# Patient Record
Sex: Female | Born: 1940
Health system: Southern US, Community
[De-identification: ages and names within clinical notes are randomized; demographics above are authoritative.]

## PROBLEM LIST (undated history)

## (undated) ENCOUNTER — Emergency Department (HOSPITAL_COMMUNITY): Admission: EM | Payer: Medicare Other | Source: Home / Self Care

## (undated) DIAGNOSIS — J40 Bronchitis, not specified as acute or chronic: Secondary | ICD-10-CM

## (undated) DIAGNOSIS — G4700 Insomnia, unspecified: Secondary | ICD-10-CM

## (undated) DIAGNOSIS — G629 Polyneuropathy, unspecified: Secondary | ICD-10-CM

## (undated) DIAGNOSIS — Z9889 Other specified postprocedural states: Secondary | ICD-10-CM

## (undated) DIAGNOSIS — H269 Unspecified cataract: Secondary | ICD-10-CM

## (undated) DIAGNOSIS — I2510 Atherosclerotic heart disease of native coronary artery without angina pectoris: Secondary | ICD-10-CM

## (undated) DIAGNOSIS — R1032 Left lower quadrant pain: Secondary | ICD-10-CM

## (undated) DIAGNOSIS — K219 Gastro-esophageal reflux disease without esophagitis: Secondary | ICD-10-CM

## (undated) DIAGNOSIS — T7840XA Allergy, unspecified, initial encounter: Secondary | ICD-10-CM

## (undated) DIAGNOSIS — J45909 Unspecified asthma, uncomplicated: Secondary | ICD-10-CM

## (undated) DIAGNOSIS — Z87898 Personal history of other specified conditions: Secondary | ICD-10-CM

## (undated) DIAGNOSIS — E559 Vitamin D deficiency, unspecified: Secondary | ICD-10-CM

## (undated) DIAGNOSIS — Z9221 Personal history of antineoplastic chemotherapy: Secondary | ICD-10-CM

## (undated) DIAGNOSIS — R635 Abnormal weight gain: Secondary | ICD-10-CM

## (undated) DIAGNOSIS — Z8669 Personal history of other diseases of the nervous system and sense organs: Secondary | ICD-10-CM

## (undated) DIAGNOSIS — R7303 Prediabetes: Secondary | ICD-10-CM

## (undated) DIAGNOSIS — I6523 Occlusion and stenosis of bilateral carotid arteries: Secondary | ICD-10-CM

## (undated) DIAGNOSIS — R519 Headache, unspecified: Secondary | ICD-10-CM

## (undated) DIAGNOSIS — K5792 Diverticulitis of intestine, part unspecified, without perforation or abscess without bleeding: Secondary | ICD-10-CM

## (undated) DIAGNOSIS — M1990 Unspecified osteoarthritis, unspecified site: Secondary | ICD-10-CM

## (undated) DIAGNOSIS — F5104 Psychophysiologic insomnia: Secondary | ICD-10-CM

## (undated) DIAGNOSIS — B3781 Candidal esophagitis: Secondary | ICD-10-CM

## (undated) DIAGNOSIS — R51 Headache: Secondary | ICD-10-CM

## (undated) DIAGNOSIS — E079 Disorder of thyroid, unspecified: Secondary | ICD-10-CM

## (undated) DIAGNOSIS — D649 Anemia, unspecified: Secondary | ICD-10-CM

## (undated) DIAGNOSIS — J9801 Acute bronchospasm: Secondary | ICD-10-CM

## (undated) DIAGNOSIS — J189 Pneumonia, unspecified organism: Secondary | ICD-10-CM

## (undated) DIAGNOSIS — T4145XA Adverse effect of unspecified anesthetic, initial encounter: Secondary | ICD-10-CM

## (undated) DIAGNOSIS — K5790 Diverticulosis of intestine, part unspecified, without perforation or abscess without bleeding: Secondary | ICD-10-CM

## (undated) DIAGNOSIS — H359 Unspecified retinal disorder: Secondary | ICD-10-CM

## (undated) DIAGNOSIS — M4802 Spinal stenosis, cervical region: Secondary | ICD-10-CM

## (undated) DIAGNOSIS — D229 Melanocytic nevi, unspecified: Secondary | ICD-10-CM

## (undated) DIAGNOSIS — R748 Abnormal levels of other serum enzymes: Secondary | ICD-10-CM

## (undated) DIAGNOSIS — R112 Nausea with vomiting, unspecified: Secondary | ICD-10-CM

## (undated) DIAGNOSIS — R32 Unspecified urinary incontinence: Secondary | ICD-10-CM

## (undated) DIAGNOSIS — J449 Chronic obstructive pulmonary disease, unspecified: Secondary | ICD-10-CM

## (undated) DIAGNOSIS — T8859XA Other complications of anesthesia, initial encounter: Secondary | ICD-10-CM

## (undated) DIAGNOSIS — F32A Depression, unspecified: Secondary | ICD-10-CM

## (undated) DIAGNOSIS — R42 Dizziness and giddiness: Secondary | ICD-10-CM

## (undated) DIAGNOSIS — E785 Hyperlipidemia, unspecified: Secondary | ICD-10-CM

## (undated) DIAGNOSIS — Z8489 Family history of other specified conditions: Secondary | ICD-10-CM

## (undated) DIAGNOSIS — C4492 Squamous cell carcinoma of skin, unspecified: Secondary | ICD-10-CM

## (undated) DIAGNOSIS — F329 Major depressive disorder, single episode, unspecified: Secondary | ICD-10-CM

## (undated) DIAGNOSIS — K589 Irritable bowel syndrome without diarrhea: Secondary | ICD-10-CM

## (undated) DIAGNOSIS — R5383 Other fatigue: Secondary | ICD-10-CM

## (undated) DIAGNOSIS — Z87442 Personal history of urinary calculi: Secondary | ICD-10-CM

## (undated) DIAGNOSIS — E039 Hypothyroidism, unspecified: Secondary | ICD-10-CM

## (undated) DIAGNOSIS — F419 Anxiety disorder, unspecified: Secondary | ICD-10-CM

## (undated) DIAGNOSIS — K760 Fatty (change of) liver, not elsewhere classified: Secondary | ICD-10-CM

## (undated) DIAGNOSIS — C439 Malignant melanoma of skin, unspecified: Secondary | ICD-10-CM

## (undated) DIAGNOSIS — I639 Cerebral infarction, unspecified: Secondary | ICD-10-CM

## (undated) HISTORY — DX: Occlusion and stenosis of bilateral carotid arteries: I65.23

## (undated) HISTORY — DX: Headache: R51

## (undated) HISTORY — DX: Other fatigue: R53.83

## (undated) HISTORY — PX: BREAST SURGERY: SHX581

## (undated) HISTORY — PX: JOINT REPLACEMENT: SHX530

## (undated) HISTORY — PX: SKIN CANCER EXCISION: SHX779

## (undated) HISTORY — DX: Unspecified retinal disorder: H35.9

## (undated) HISTORY — DX: Personal history of other diseases of the nervous system and sense organs: Z86.69

## (undated) HISTORY — DX: Headache, unspecified: R51.9

## (undated) HISTORY — DX: Vitamin D deficiency, unspecified: E55.9

## (undated) HISTORY — DX: Diverticulosis of intestine, part unspecified, without perforation or abscess without bleeding: K57.90

## (undated) HISTORY — DX: Polyneuropathy, unspecified: G62.9

## (undated) HISTORY — PX: TOTAL KNEE ARTHROPLASTY: SHX125

## (undated) HISTORY — DX: Irritable bowel syndrome, unspecified: K58.9

## (undated) HISTORY — DX: Hyperlipidemia, unspecified: E78.5

## (undated) HISTORY — DX: Squamous cell carcinoma of skin, unspecified: C44.92

## (undated) HISTORY — DX: Acute bronchospasm: J98.01

## (undated) HISTORY — DX: Abnormal levels of other serum enzymes: R74.8

## (undated) HISTORY — PX: ROTATOR CUFF REPAIR: SHX139

## (undated) HISTORY — DX: Allergy, unspecified, initial encounter: T78.40XA

## (undated) HISTORY — DX: Personal history of other specified conditions: Z87.898

## (undated) HISTORY — DX: Psychophysiologic insomnia: F51.04

## (undated) HISTORY — PX: SHOULDER SURGERY: SHX246

## (undated) HISTORY — DX: Diverticulitis of intestine, part unspecified, without perforation or abscess without bleeding: K57.92

## (undated) HISTORY — PX: KNEE ARTHROSCOPY: SUR90

## (undated) HISTORY — DX: Unspecified cataract: H26.9

## (undated) HISTORY — PX: CLOSED MANIPULATION SHOULDER: SUR205

## (undated) HISTORY — DX: Dizziness and giddiness: R42

## (undated) HISTORY — DX: Atherosclerotic heart disease of native coronary artery without angina pectoris: I25.10

## (undated) HISTORY — DX: Unspecified asthma, uncomplicated: J45.909

## (undated) HISTORY — PX: OOPHORECTOMY: SHX86

## (undated) HISTORY — DX: Left lower quadrant pain: R10.32

## (undated) HISTORY — PX: TOTAL HIP ARTHROPLASTY: SHX124

## (undated) HISTORY — PX: OTHER SURGICAL HISTORY: SHX169

## (undated) HISTORY — DX: Unspecified osteoarthritis, unspecified site: M19.90

## (undated) HISTORY — DX: Fatty (change of) liver, not elsewhere classified: K76.0

## (undated) HISTORY — DX: Abnormal weight gain: R63.5

## (undated) HISTORY — DX: Spinal stenosis, cervical region: M48.02

## (undated) HISTORY — DX: Melanocytic nevi, unspecified: D22.9

## (undated) HISTORY — PX: TUBAL LIGATION: SHX77

## (undated) HISTORY — DX: Bronchitis, not specified as acute or chronic: J40

## (undated) HISTORY — DX: Unspecified urinary incontinence: R32

## (undated) HISTORY — PX: TUMOR REMOVAL: SHX12

## (undated) HISTORY — PX: ABLATION: SHX5711

## (undated) HISTORY — PX: FOOT BONE EXCISION: SUR493

## (undated) HISTORY — PX: BILATERAL SALPINGOOPHORECTOMY: SHX1223

## (undated) HISTORY — DX: Chronic obstructive pulmonary disease, unspecified: J44.9

## (undated) HISTORY — DX: Anemia, unspecified: D64.9

## (undated) HISTORY — DX: Candidal esophagitis: B37.81

---

## 1998-11-10 ENCOUNTER — Other Ambulatory Visit: Admission: RE | Admit: 1998-11-10 | Discharge: 1998-11-10 | Payer: Self-pay | Admitting: Gynecology

## 1998-12-29 ENCOUNTER — Other Ambulatory Visit: Admission: RE | Admit: 1998-12-29 | Discharge: 1998-12-29 | Payer: Self-pay | Admitting: Gynecology

## 1999-11-15 ENCOUNTER — Other Ambulatory Visit: Admission: RE | Admit: 1999-11-15 | Discharge: 1999-11-15 | Payer: Self-pay | Admitting: Gynecology

## 2000-04-25 ENCOUNTER — Ambulatory Visit (HOSPITAL_COMMUNITY): Admission: RE | Admit: 2000-04-25 | Discharge: 2000-04-25 | Payer: Self-pay | Admitting: Gastroenterology

## 2002-03-12 ENCOUNTER — Other Ambulatory Visit: Admission: RE | Admit: 2002-03-12 | Discharge: 2002-03-12 | Payer: Self-pay | Admitting: Obstetrics and Gynecology

## 2002-05-08 ENCOUNTER — Encounter: Payer: Self-pay | Admitting: Emergency Medicine

## 2002-05-08 ENCOUNTER — Observation Stay (HOSPITAL_COMMUNITY): Admission: EM | Admit: 2002-05-08 | Discharge: 2002-05-09 | Payer: Self-pay | Admitting: Emergency Medicine

## 2002-05-09 ENCOUNTER — Encounter: Payer: Self-pay | Admitting: Internal Medicine

## 2003-03-18 ENCOUNTER — Encounter: Payer: Self-pay | Admitting: Internal Medicine

## 2003-03-18 ENCOUNTER — Encounter: Admission: RE | Admit: 2003-03-18 | Discharge: 2003-03-18 | Payer: Self-pay | Admitting: Internal Medicine

## 2003-06-17 ENCOUNTER — Other Ambulatory Visit: Admission: RE | Admit: 2003-06-17 | Discharge: 2003-06-17 | Payer: Self-pay | Admitting: Gynecology

## 2003-08-30 ENCOUNTER — Inpatient Hospital Stay (HOSPITAL_COMMUNITY): Admission: RE | Admit: 2003-08-30 | Discharge: 2003-08-31 | Payer: Self-pay | Admitting: Orthopedic Surgery

## 2004-08-25 ENCOUNTER — Encounter: Admission: RE | Admit: 2004-08-25 | Discharge: 2004-08-25 | Payer: Self-pay | Admitting: Orthopedic Surgery

## 2004-08-31 ENCOUNTER — Ambulatory Visit (HOSPITAL_BASED_OUTPATIENT_CLINIC_OR_DEPARTMENT_OTHER): Admission: RE | Admit: 2004-08-31 | Discharge: 2004-08-31 | Payer: Self-pay | Admitting: Orthopedic Surgery

## 2004-08-31 ENCOUNTER — Ambulatory Visit (HOSPITAL_COMMUNITY): Admission: RE | Admit: 2004-08-31 | Discharge: 2004-08-31 | Payer: Self-pay | Admitting: Orthopedic Surgery

## 2004-09-30 ENCOUNTER — Other Ambulatory Visit: Admission: RE | Admit: 2004-09-30 | Discharge: 2004-09-30 | Payer: Self-pay | Admitting: Gynecology

## 2005-07-01 ENCOUNTER — Encounter: Admission: RE | Admit: 2005-07-01 | Discharge: 2005-07-01 | Payer: Self-pay | Admitting: Internal Medicine

## 2005-09-05 ENCOUNTER — Encounter: Admission: RE | Admit: 2005-09-05 | Discharge: 2005-09-05 | Payer: Self-pay | Admitting: Internal Medicine

## 2005-10-13 ENCOUNTER — Other Ambulatory Visit: Admission: RE | Admit: 2005-10-13 | Discharge: 2005-10-13 | Payer: Self-pay | Admitting: Gynecology

## 2006-09-12 ENCOUNTER — Encounter: Admission: RE | Admit: 2006-09-12 | Discharge: 2006-09-12 | Payer: Self-pay | Admitting: *Deleted

## 2006-12-19 ENCOUNTER — Encounter (INDEPENDENT_AMBULATORY_CARE_PROVIDER_SITE_OTHER): Payer: Self-pay | Admitting: Obstetrics and Gynecology

## 2006-12-19 ENCOUNTER — Ambulatory Visit (HOSPITAL_COMMUNITY): Admission: RE | Admit: 2006-12-19 | Discharge: 2006-12-19 | Payer: Self-pay | Admitting: Obstetrics and Gynecology

## 2007-07-26 ENCOUNTER — Encounter: Admission: RE | Admit: 2007-07-26 | Discharge: 2007-10-09 | Payer: Self-pay | Admitting: Neurology

## 2007-07-26 ENCOUNTER — Ambulatory Visit: Payer: Self-pay | Admitting: Psychology

## 2008-01-14 ENCOUNTER — Emergency Department (HOSPITAL_BASED_OUTPATIENT_CLINIC_OR_DEPARTMENT_OTHER): Admission: EM | Admit: 2008-01-14 | Discharge: 2008-01-15 | Payer: Self-pay | Admitting: Emergency Medicine

## 2008-04-02 ENCOUNTER — Ambulatory Visit: Payer: Self-pay | Admitting: Psychology

## 2008-04-02 ENCOUNTER — Encounter: Admission: RE | Admit: 2008-04-02 | Discharge: 2008-06-20 | Payer: Self-pay | Admitting: Psychology

## 2008-05-22 ENCOUNTER — Encounter: Admission: RE | Admit: 2008-05-22 | Discharge: 2008-07-08 | Payer: Self-pay | Admitting: Orthopedic Surgery

## 2008-05-26 ENCOUNTER — Ambulatory Visit: Payer: Self-pay | Admitting: Psychology

## 2008-08-05 ENCOUNTER — Ambulatory Visit: Payer: Self-pay | Admitting: Vascular Surgery

## 2008-08-05 ENCOUNTER — Ambulatory Visit: Admission: RE | Admit: 2008-08-05 | Discharge: 2008-08-05 | Payer: Self-pay | Admitting: Orthopedic Surgery

## 2008-08-05 ENCOUNTER — Encounter (INDEPENDENT_AMBULATORY_CARE_PROVIDER_SITE_OTHER): Payer: Self-pay | Admitting: Orthopedic Surgery

## 2008-08-25 ENCOUNTER — Inpatient Hospital Stay (HOSPITAL_COMMUNITY): Admission: RE | Admit: 2008-08-25 | Discharge: 2008-08-28 | Payer: Self-pay | Admitting: Orthopedic Surgery

## 2008-09-29 ENCOUNTER — Encounter: Admission: RE | Admit: 2008-09-29 | Discharge: 2008-12-23 | Payer: Self-pay | Admitting: Orthopedic Surgery

## 2008-10-23 ENCOUNTER — Encounter: Admission: RE | Admit: 2008-10-23 | Discharge: 2008-10-23 | Payer: Self-pay | Admitting: Family Medicine

## 2009-04-08 ENCOUNTER — Ambulatory Visit (HOSPITAL_COMMUNITY): Admission: RE | Admit: 2009-04-08 | Discharge: 2009-04-08 | Payer: Self-pay | Admitting: Orthopedic Surgery

## 2010-01-13 ENCOUNTER — Emergency Department (HOSPITAL_COMMUNITY): Admission: EM | Admit: 2010-01-13 | Discharge: 2010-01-14 | Payer: Self-pay | Admitting: Emergency Medicine

## 2010-05-09 ENCOUNTER — Ambulatory Visit: Payer: Self-pay | Admitting: Diagnostic Radiology

## 2010-05-09 ENCOUNTER — Emergency Department (HOSPITAL_BASED_OUTPATIENT_CLINIC_OR_DEPARTMENT_OTHER): Admission: EM | Admit: 2010-05-09 | Discharge: 2010-05-09 | Payer: Self-pay | Admitting: Emergency Medicine

## 2010-08-08 ENCOUNTER — Encounter: Payer: Self-pay | Admitting: Internal Medicine

## 2010-08-12 ENCOUNTER — Other Ambulatory Visit: Payer: Self-pay | Admitting: Dermatology

## 2010-09-29 LAB — URINALYSIS, ROUTINE W REFLEX MICROSCOPIC
Bilirubin Urine: NEGATIVE
Glucose, UA: NEGATIVE mg/dL
Hgb urine dipstick: NEGATIVE
Ketones, ur: 40 mg/dL — AB
Nitrite: NEGATIVE
Protein, ur: NEGATIVE mg/dL
Specific Gravity, Urine: 1.026 (ref 1.005–1.030)
Urobilinogen, UA: 0.2 mg/dL (ref 0.0–1.0)
pH: 6.5 (ref 5.0–8.0)

## 2010-10-03 LAB — CBC
HCT: 41.2 % (ref 36.0–46.0)
Hemoglobin: 14.1 g/dL (ref 12.0–15.0)
MCH: 31 pg (ref 26.0–34.0)
MCHC: 34.2 g/dL (ref 30.0–36.0)
MCV: 90.6 fL (ref 78.0–100.0)
Platelets: 204 10*3/uL (ref 150–400)
RBC: 4.55 MIL/uL (ref 3.87–5.11)
RDW: 13.1 % (ref 11.5–15.5)
WBC: 5.6 10*3/uL (ref 4.0–10.5)

## 2010-10-03 LAB — COMPREHENSIVE METABOLIC PANEL
ALT: 15 U/L (ref 0–35)
AST: 17 U/L (ref 0–37)
Albumin: 3.9 g/dL (ref 3.5–5.2)
Alkaline Phosphatase: 73 U/L (ref 39–117)
BUN: 11 mg/dL (ref 6–23)
CO2: 24 mEq/L (ref 19–32)
Calcium: 8.7 mg/dL (ref 8.4–10.5)
Chloride: 107 mEq/L (ref 96–112)
Creatinine, Ser: 0.77 mg/dL (ref 0.4–1.2)
GFR calc Af Amer: >60 mL/min (ref >60)
GFR calc non Af Amer: >60 mL/min (ref >60)
Glucose, Bld: 106 mg/dL — ABNORMAL HIGH (ref 70–99)
Potassium: 3.7 mEq/L (ref 3.5–5.1)
Sodium: 141 mEq/L (ref 135–145)
Total Bilirubin: 1 mg/dL (ref 0.3–1.2)
Total Protein: 6.4 g/dL (ref 6.0–8.3)

## 2010-10-03 LAB — URINALYSIS, ROUTINE W REFLEX MICROSCOPIC
Bilirubin Urine: NEGATIVE
Glucose, UA: NEGATIVE mg/dL
Hgb urine dipstick: NEGATIVE
Ketones, ur: NEGATIVE mg/dL
Nitrite: NEGATIVE
Protein, ur: NEGATIVE mg/dL
Specific Gravity, Urine: 1.007 (ref 1.005–1.030)
Urobilinogen, UA: 0.2 mg/dL (ref 0.0–1.0)
pH: 6.5 (ref 5.0–8.0)

## 2010-10-03 LAB — URINE MICROSCOPIC-ADD ON

## 2010-10-03 LAB — D-DIMER, QUANTITATIVE: D-Dimer, Quant: 0.75 ug/mL-FEU — ABNORMAL HIGH (ref 0.00–0.48)

## 2010-10-03 LAB — POCT CARDIAC MARKERS
CKMB, poc: 1.5 ng/mL (ref 1.0–8.0)
Myoglobin, poc: 115 ng/mL (ref 12–200)
Troponin i, poc: <0.05 ng/mL (ref 0.00–0.09)

## 2010-10-03 LAB — DIFFERENTIAL
Basophils Absolute: 0 10*3/uL (ref 0.0–0.1)
Basophils Relative: 0 % (ref 0–1)
Eosinophils Absolute: 0 10*3/uL (ref 0.0–0.7)
Eosinophils Relative: 0 % (ref 0–5)
Lymphocytes Relative: 14 % (ref 12–46)
Lymphs Abs: 0.8 10*3/uL (ref 0.7–4.0)
Monocytes Absolute: 0.2 10*3/uL (ref 0.1–1.0)
Monocytes Relative: 4 % (ref 3–12)
Neutro Abs: 4.5 10*3/uL (ref 1.7–7.7)
Neutrophils Relative %: 82 % — ABNORMAL HIGH (ref 43–77)

## 2010-10-03 LAB — LIPASE, BLOOD: Lipase: 29 U/L (ref 11–59)

## 2010-11-02 LAB — BASIC METABOLIC PANEL
BUN: 16 mg/dL (ref 6–23)
BUN: 18 mg/dL (ref 6–23)
BUN: 19 mg/dL (ref 6–23)
CO2: 27 mEq/L (ref 19–32)
CO2: 28 mEq/L (ref 19–32)
CO2: 28 mEq/L (ref 19–32)
Calcium: 8 mg/dL — ABNORMAL LOW (ref 8.4–10.5)
Calcium: 8.4 mg/dL (ref 8.4–10.5)
Calcium: 9.1 mg/dL (ref 8.4–10.5)
Chloride: 106 mEq/L (ref 96–112)
Chloride: 107 mEq/L (ref 96–112)
Chloride: 98 mEq/L (ref 96–112)
Creatinine, Ser: 0.82 mg/dL (ref 0.4–1.2)
Creatinine, Ser: 0.85 mg/dL (ref 0.4–1.2)
Creatinine, Ser: 0.88 mg/dL (ref 0.4–1.2)
GFR calc Af Amer: >60 mL/min (ref >60)
GFR calc Af Amer: >60 mL/min (ref >60)
GFR calc Af Amer: >60 mL/min (ref >60)
GFR calc non Af Amer: >60 mL/min (ref >60)
GFR calc non Af Amer: >60 mL/min (ref >60)
GFR calc non Af Amer: >60 mL/min (ref >60)
Glucose, Bld: 128 mg/dL — ABNORMAL HIGH (ref 70–99)
Glucose, Bld: 134 mg/dL — ABNORMAL HIGH (ref 70–99)
Glucose, Bld: 148 mg/dL — ABNORMAL HIGH (ref 70–99)
Potassium: 3.6 mEq/L (ref 3.5–5.1)
Potassium: 3.9 mEq/L (ref 3.5–5.1)
Potassium: 4.2 mEq/L (ref 3.5–5.1)
Sodium: 135 mEq/L (ref 135–145)
Sodium: 136 mEq/L (ref 135–145)
Sodium: 138 mEq/L (ref 135–145)

## 2010-11-02 LAB — CBC
HCT: 29.4 % — ABNORMAL LOW (ref 36.0–46.0)
HCT: 30.6 % — ABNORMAL LOW (ref 36.0–46.0)
HCT: 38.6 % (ref 36.0–46.0)
Hemoglobin: 10.1 g/dL — ABNORMAL LOW (ref 12.0–15.0)
Hemoglobin: 10.3 g/dL — ABNORMAL LOW (ref 12.0–15.0)
Hemoglobin: 13 g/dL (ref 12.0–15.0)
MCHC: 33.6 g/dL (ref 30.0–36.0)
MCHC: 33.8 g/dL (ref 30.0–36.0)
MCHC: 34.3 g/dL (ref 30.0–36.0)
MCV: 91.6 fL (ref 78.0–100.0)
MCV: 92.5 fL (ref 78.0–100.0)
MCV: 93.1 fL (ref 78.0–100.0)
Platelets: 166 10*3/uL (ref 150–400)
Platelets: 168 10*3/uL (ref 150–400)
Platelets: 201 10*3/uL (ref 150–400)
RBC: 3.18 MIL/uL — ABNORMAL LOW (ref 3.87–5.11)
RBC: 3.29 MIL/uL — ABNORMAL LOW (ref 3.87–5.11)
RBC: 4.21 MIL/uL (ref 3.87–5.11)
RDW: 12.8 % (ref 11.5–15.5)
RDW: 12.8 % (ref 11.5–15.5)
RDW: 12.9 % (ref 11.5–15.5)
WBC: 5 10*3/uL (ref 4.0–10.5)
WBC: 6.1 10*3/uL (ref 4.0–10.5)
WBC: 6.6 10*3/uL (ref 4.0–10.5)

## 2010-11-02 LAB — URINALYSIS, ROUTINE W REFLEX MICROSCOPIC
Bilirubin Urine: NEGATIVE
Glucose, UA: NEGATIVE mg/dL
Hgb urine dipstick: NEGATIVE
Ketones, ur: NEGATIVE mg/dL
Nitrite: NEGATIVE
Protein, ur: NEGATIVE mg/dL
Specific Gravity, Urine: 1.01 (ref 1.005–1.030)
Urobilinogen, UA: 0.2 mg/dL (ref 0.0–1.0)
pH: 6.5 (ref 5.0–8.0)

## 2010-11-02 LAB — DIFFERENTIAL
Basophils Absolute: 0 10*3/uL (ref 0.0–0.1)
Basophils Relative: 1 % (ref 0–1)
Eosinophils Absolute: 0.1 10*3/uL (ref 0.0–0.7)
Eosinophils Relative: 2 % (ref 0–5)
Lymphocytes Relative: 28 % (ref 12–46)
Lymphs Abs: 1.4 10*3/uL (ref 0.7–4.0)
Monocytes Absolute: 0.4 10*3/uL (ref 0.1–1.0)
Monocytes Relative: 9 % (ref 3–12)
Neutro Abs: 3 10*3/uL (ref 1.7–7.7)
Neutrophils Relative %: 60 % (ref 43–77)

## 2010-11-02 LAB — PROTIME-INR
INR: 1.1 (ref 0.00–1.49)
Prothrombin Time: 14 seconds (ref 11.6–15.2)

## 2010-11-02 LAB — TYPE AND SCREEN
ABO/RH(D): B POS
Antibody Screen: NEGATIVE

## 2010-11-02 LAB — APTT: aPTT: 33 seconds (ref 24–37)

## 2010-11-02 LAB — ABO/RH: ABO/RH(D): B POS

## 2010-11-30 NOTE — Discharge Summary (Signed)
[name], [name]                ACCOUNT NO.:  [id number]   MEDICAL RECORD NO.:  [id number]          PATIENT TYPE:  INP   LOCATION:  1607                         FACILITY:  [facility]   PHYSICIAN:  Madlyn Frankel. Charlann Boxer, M.D.  DATE OF BIRTH:  1940/09/28   DATE OF ADMISSION:  08/25/2008  DATE OF DISCHARGE:  08/28/2008                               DISCHARGE SUMMARY   ADMITTING DIAGNOSES:  1. Osteoarthritis.  2. Anxiety and depression.  3. Irritable bowel syndrome.  4. Diverticulosis.  5. Degenerative disk disease.  6. Menopause.   DISCHARGE DIAGNOSES:  1. Osteoarthritis.  2. Anxiety and depression.  3. Irritable bowel syndrome.  4. Diverticulosis.  5. Degenerative disk disease.  6. Menopause.   HISTORY OF PRESENT ILLNESS:  A 70 year old female with a history of  right knee pain with multiple effusions secondary to osteoarthritis.  It  was refractory to all conservative treatment.  She was presurgically  assessed by her primary care physician, Dr. Kirby Funk.   CONSULTS:  None.   PROCEDURE:  Right total knee replacement by surgeon Dr. Durene Romans,  assistant Dwyane Luo, PA-C.   LABORATORY DATA:  CBC final reading showed her white blood cells 6.6,  hemoglobin 10.1, hematocrit 29.4 and platelets 168.  Metabolic panel:  Sodium 138, potassium 4.2, BUN 16, creatinine 0.85, glucose 148.  Calcium was 8.4.  UA was negative for nitrites.   Cardiology:  EKG presurgical workup showed sinus rhythm.   HOSPITAL COURSE:  The patient underwent a right total knee replacement  and admitted to the orthopedic floor.  Her stay was unremarkable.  She  remained hemodynamically orthopedically stable.  She had improving quad  function throughout her course of stay.  The dressing was changed on a  daily basis after day 1, no significant drainage from the wound.  She  was weightbearing as tolerated, and by day 3 was stable and ready for  discharge to skilled nursing facility rehab for further  advancement.   DISCHARGE DISPOSITION:  Discharged to skilled nurse facility rehab in  stable improved condition.   DISCHARGE PHYSICAL THERAPY:  She is weightbearing as tolerated with the  use of rolling walker.  Goals at physical therapy are to be range of  motion 0-100 degrees at 2 weeks, 0-120 degrees at 6 weeks.   DISCHARGE DIET:  Heart-healthy.   DISCHARGE WOUND CARE:  Keep dry and change dressing on a daily basis.  The patient may shower, just cover with plastic tape top edge.   DISCHARGE MEDICATIONS:  1. Enteric-coated aspirin 325 mg 1 p.o. b.i.d. x6 weeks.  2. Robaxin 500 mg 1 tab every 6 hours for muscle spasm pain p.r.n.  3. Iron 325 mg 1 p.o. t.i.d. x2 weeks.  4. Colace 100 mg 1 p.o. b.i.d.  5. MiraLax 17 g 1 p.o. daily.  6. Celebrex 200 mg 1 p.o. b.i.d. x2 weeks.  7. Norco 7.5/325 one to two p.o. q. 4-6 pain.  8. Senokot 1-2 p.o. b.i.d. p.r.n. for constipation.  9. Melatonin 3 mg q.h.s.  10.Xanax 0.5 mg 1 p.o. q. 6 p.r.n. anxiety.  11.Simvastatin 20  mg p.o. q.a.m.  12.Nexium 40 mg 1 p.o. q.a.m.  13.Lyrica 50 mg 1 p.o. b.i.d.  14.Fish oil 1000 mg p.o. q.a.m.  15.Flaxseed oil p.o. daily.  16.Vitamin C 500 mg p.o. daily.  17.Calcium 600 mg plus vitamin D3, 400 units p.o. b.i.d.  18.ProAir inhaler p.r.n.   DISCHARGE FOLLOWUP:  1. Follow up with Dr. Charlann Boxer at phone number [phone] in 2 weeks for      wound check.  2. Follow up with Dr. Kirby Funk with any medical questions.     ______________________________  Yetta Glassman [address], [place]      Madlyn Frankel. Charlann Boxer, M.D.  Electronically Signed    BLM/MEDQ  D:  08/28/2008  T:  08/28/2008  Job:  [id number]   cc:   Thora Lance, M.D.  Fax: [phone]

## 2010-11-30 NOTE — Op Note (Signed)
NAMEJERNEE, [name]                ACCOUNT NO.:  [id number]   MEDICAL RECORD NO.:  [id number]          PATIENT TYPE:  INP   LOCATION:  1607                         FACILITY:  [facility]   PHYSICIAN:  Madlyn Frankel. Charlann Boxer, M.D.  DATE OF BIRTH:  1941-05-08   DATE OF PROCEDURE:  08/25/2008  DATE OF DISCHARGE:                               OPERATIVE REPORT   PREOPERATIVE DIAGNOSIS:  Right knee osteoarthritis with significant  synovitic response.   POSTOPERATIVE DIAGNOSIS:  Right knee osteoarthritis with significant  synovitic response.   PROCEDURE:  Right total knee replacement.   COMPONENTS USED:  DePuy rotating platform posterior stabilized knee  system with a size 4 narrow femur, a 3 tibia, a10 mm insert to match the  size 4 femur, 38 patellar button.   SURGEON:  Luna Fuse, M.D.   ASSISTANT:  Dwyane Luo, PA-C   ANESTHESIA:  Duramorph spinal.   SPECIMENS:  None.   FINDINGS:  None.   DRAINS:  One Hemovac.   TOURNIQUET TIME:  42 minutes at 250 mmHg.   INDICATIONS OF PROCEDURE:  [name] is a 70 year old female with  history of right knee arthritis failing conservative measures including  repeat arthroscopic surgery injections.  She has had recurrent effusions  without concerns for infection due to her advanced arthritis and failing  conservative measures.  She had been planning to discuss knee  replacement surgery.  Risks and benefits of the surgery were discussed  in her current setting of the situation including the need for complete  synovectomy.  Consent was obtained for the above procedure.  After  reviewing the risks of infection, DVT, component failure, need for  revision surgery, postoperative course expectations, stiffness, etc.,  consent was obtained for the benefit of pain relief.   PROCEDURE IN DETAIL:  The patient was brought to the operative theater.  Once adequate anesthesia, preoperative antibiotics, Ancef administered,  the patient was positioned supine.  The right  thigh tourniquet was used.  The right lower extremity was pre scrubbed, prepped and draped in a  sterile fashion.  A time-out was performed identifying the patient,  extremity and the planned procedure.   The leg was exsanguinated, tourniquet elevated to 250 mmHg.  A midline  incision was made followed by a median arthrotomy.  Following the  arthrotomy and initial synovectomy, attention was directed to the  patella.  The precut measurement was 23 mm.  I resected down to 14 mm  and used a 38 patellar button.  This fit the cut surface the best. Lug  holes were drilled and other shim was placed on the cut surface to  protect the retractors and saw blades.   Attention was then directed to the femur.  The femoral canal was opened  with a drill and irrigated to prevent fat emboli.  I then placed an  intramedullary rod and at 3 degrees valgus, I resected 10 mm of bone off  the distal femur.  Following this resection and some further  debridement, attention was directed to the tibia.  The tibia was  subluxated anteriorly.  The extramedullary guide  was used.  I resected  approximately 8-9 mm off of the proximal lateral tibia.  This appeared  to be symmetric radiographically.  After this cut, I checked with a 10  mm insert and the knee came to full extension.   At this point,  I checked the cut surface of the tibia with a size 3  tray and an alignment rod to confirm the cut was perpendicular in both  planes.  One I was satisfied that it was, I attended to the femur.  The  femur was sized to be a size 4 in the anterior-posterior dimension, but  it was more of a narrow component medially to laterally.  I then set the  rotation based off the proximal cut of the tibia.  The rotation pins  were placed in position and I exchanged this out for the 4-in-1 cutting  block.  The anterior-posterior chamfer cuts were then made without  difficulty.  Final box cut was made utilizing the box cut.  Attention   was then redirected back to the tibia with the tibia subluxating  anterior. I felt that the size 3 fit the best without overhang.  A size  3 component was pinned into position and then drilled and keel punched  in the medial third of the tubercle.  A trial reduction was then carried  out with 10 mm insert for a narrow femur.  The knee came to full  extension and the ligaments felt stable from extension to flexion.  The  patella tracked through the trochlea without application of pressure.   Trial components were removed.  The synovial capsule junction was  injected with first Marcaine with epinephrine and 1 mL of Toradol.  This  was after the synovectomy had been done.  The knee was then irrigated  with normal saline solution.  Final debridement was carried out as  necessary.   Final components were opened, cement mixed.  Final components were then  cemented into position.  The knee was brought to extension with a 10 mm  insert.  When the cement had cured, excess cement was removed throughout  the knee.  Once I was satisfied there was no remaining cement that was  loose or could be significant cause third body wear, the final 10-mm  insert to match the 48 femur was inserted.  The knee was re-irrigated  with normal saline solution and I placed a medium Hemovac drain deep.  The extensor mechanism was then reapproximated using #1 Vicryl with the  knee in flexion.  The remaining wound was closed in layers with 2-0  Vicryl and a running 4-0 Monocryl.  The wound was cleaned, dried and  dressed sterilely with a sterile bulky Jones dressing.  She was brought  to the recovery room in stable condition.      Madlyn Frankel Charlann Boxer, M.D.  Electronically Signed     MDO/MEDQ  D:  08/25/2008  T:  08/25/2008  Job:  [id number]

## 2010-11-30 NOTE — H&P (Signed)
[name], [name]                ACCOUNT NO.:  [id number]   MEDICAL RECORD NO.:  [id number]          PATIENT TYPE:  INP   LOCATION:  NA                           [facility]ACILITY[facility]  [facility]   PHYSICIAN:  Madlyn Frankel. Charlann Boxer, M.D.  DATE OF BIRTH:  [birth year]   DATE OF ADMISSION:  08/25/2008  DATE OF DISCHARGE:                              HISTORY & PHYSICAL   PROCEDURE:  Right total knee replacement on August 25, 2008, which is  the day of admission.   CHIEF COMPLAINT:  Right knee pain.   HISTORY OF PRESENT ILLNESS:  A 70 year old female with a history of  right knee pain secondary to osteoarthritis.  It has been refractory to  all conservative treatment including oral anti-inflammatories, cortisone  injections and arthroscopic debridement.  She has been presurgically  assessed by her primary care physician, Dr. Kirby Funk.   MEDICAL HISTORY:  1. Osteoarthritis.  2. Anxiety depression.  3. Irritable bowel syndrome.  4. Diverticulosis.  5. Degenerative disk disease.  6. Menopause.   PAST SURGICAL HISTORY:  1. Ovarian cyst removal.  2. Left shoulder surgery.  3. Right shoulder surgery.  4. Left knee arthroscopic debridement.  5. Right breast cyst removal.   FAMILY HISTORY:  Heart disease and arthritis.   SOCIAL HISTORY:  Nonsmoker.  Planning on going to rehab postoperatively.   DRUG ALLERGIES:  1. PENICILLIN.  2. XYLOCAINE WITH EPINEPHRINE.  3. SENSITIVE TO VALIUM.   MEDICATIONS:  1. Ibuprofen.  2. Simvastatin 20 mg one p.o. daily.  3. Nexium 40 mg one p.o. q30 minutes before a meal.  4. Lyrica 50 mg one p.o. b.i.d.  5. Voltaren gel q.i.d.  6. Fish oil daily.  7. Flaxseed oil daily.  8. Vitamin C daily.  9. Calcium plus vitamin D p.o. b.i.d.  10.Xanax 0.25 mg one p.o. q.6h. anxiety.  11.Baby aspirin 81 mg p.o. daily.  12.Celebrex 200 mg one p.o. b.i.d. x2 weeks after surgery.   REVIEW OF SYSTEMS:  GENERAL:  She has had a weight change recently.  RESPIRATORY:  She has  had a chest x-ray back in January of 2010.  CARDIOVASCULAR:  She had an EKG in January of 2010.  GASTROINTESTINAL:  She has constipation.  MUSCULOSKELETAL:  She has joint pain, joint  swelling, as well as back pain.  Has had three epidurals in the past due  to the degenerative disk problems.  Otherwise see HPI.   PHYSICAL EXAMINATION:  VITAL SIGNS:  Pulse 72, respirations 16, blood  pressure 136/78.  GENERAL:  Awake, alert and oriented.  She is anxious.  HEENT:  Normocephalic.  NECK:  Supple.  No carotid bruits.  CHEST:  Lungs clear to auscultation bilaterally.  HEART:  Regular rate and rhythm.  S1-S2 distinct.  ABDOMEN:  Soft, nontender, nondistended.  Bowel sounds present.  PELVIS:  Stable.  EXTREMITIES:  Right knee is swollen with effusion.  Painful range of  motion.  Utilizes cane for ambulation.  SKIN:  No cellulitis.  NEUROLOGIC:  Intact distal sensibilities.   LABORATORY DATA:  Labs pending presurgical testing.   EKG, chest x-ray performed  in January of 2010.   IMPRESSION:  Right knee osteoarthritis.   PLAN OF ACTION:  Right total knee replacement on August 25, 2008 at  [facility] by surgeon Dr. Durene Romans.  Risks and  complications were discussed.   The patient is planning on postoperative rehab stay; prefers [facility].     ______________________________  [name]. [name], [place]      Madlyn Frankel. Charlann Boxer, M.D.  Electronically Signed   BLM/MEDQ  D:  08/21/2008  T:  08/21/2008  Job:  [id number]   cc:   Thora Lance, M.D.  Fax: [phone]

## 2010-12-03 NOTE — Op Note (Signed)
NAME:  [name], [name]                            ACCOUNT NO.:  [id number]   MEDICAL RECORD NO.:  [id number]                   PATIENT TYPE:  AMB   LOCATION:  DAY                                  FACILITY:  [facility]   PHYSICIAN:  Marlowe Kays, M.D.               DATE OF BIRTH:  1941-02-24   DATE OF PROCEDURE:  08/29/2003  DATE OF DISCHARGE:                                 OPERATIVE REPORT   PREOPERATIVE DIAGNOSES:  1. Large rotator cuff tear, left shoulder.  2. Degeneration and possible tear of labrum, left shoulder.   POSTOPERATIVE DIAGNOSES:  1. Large rotator cuff tear of left shoulder.  2. Degenerative labrum, left shoulder.   OPERATION:  1. Left shoulder arthroscopy.  2. Open anterior acromionectomy and repair of torn rotator cuff, left     shoulder.   SURGEON:  Marlowe Kays, M.D.   ASSISTANTDruscilla Brownie. Cherlynn June.   ANESTHESIA:  Interscalene block and general anesthesia.   PATHOLOGY AND JUSTIFICATION FOR PROCEDURE:  She had a large tear, roughly  2.5 mm in length, on the MRI, and this was confirmed at surgery.  She also  had degeneration of the labrum, and there was some question as to whether or  not this might be torn as well.  Accordingly, we proceeded with the above-  mentioned surgery.   DESCRIPTION OF PROCEDURE:  Prophylactic antibiotics.  Satisfactory  interscalene block followed by a general anesthetic.  She was on the Schlein  frame in the beach chair position.  The left shoulder girdle was prepped  with Duraprep, draped in a sterile field.  The anatomy of the shoulder joint  was marked out.  At the posterior soft spot I atraumatically entered the  glenohumeral joint and found that the labrum had some degenerative fraying  but nothing that required surgical intervention, and a representative  picture was taken.  I then removed fluid from the joint and made a vertical  incision from left of the [facility] joint down to the greater tuberosity.  The  fascia over  the anterior acromion was opened in line with the skin incision  and dissected anteriorly and posteriorly, preserving the AC joint.  The  large rotator cuff tear was identified with anterior and posterior halves,  biceps tendon was intact.  She had a significant impingement problem.  Protecting the underlying structures, I placed a Cobb elevator beneath the  acromion and made several passes with micro-saw until I felt like the  impingement had been relieved.  This gave better access to the tear, and I  was able to get a nice side-to-side repair through good, healthy rotator  cuff tissue with multiple interrupted #1 Ethibond sutures.  The anterior  flap had a good bit of abrasion as well, and I included all this in the  repair process.  At the conclusion of the repair she had no residual  impingement  and a good, solid repair.  The wound was irrigated with sterile  saline.  The deltoid muscle was closed with interrupted #1 Vicryl in two  layers and the fascia over the anterior acromion in one layer with the same,  with a good, solid repair.  The subcutaneous tissue was closed with 2-0  Vicryl deep, 4-0 Vicryl superficially, with Steri-Strips on the skin, and a  4-0 nylon suture was  placed through the posterior portal.  Betadine dry sterile dressing, a  shoulder immobilizer were applied.  She tolerated the procedure well and was  taken to the recovery room in satisfactory condition with no known  complications.                                               Marlowe Kays, M.D.    JA/MEDQ  D:  08/29/2003  T:  08/29/2003  Job:  [id number]

## 2010-12-03 NOTE — H&P (Signed)
[name], [name] NO.:  [id number]   MEDICAL RECORD NO.:  [id number]                   PATIENT TYPE:   LOCATION:                                       FACILITY:   PHYSICIAN:  Marlowe Kays, M.D.               DATE OF BIRTH:  [name] 04, 1941   DATE OF ADMISSION:  DATE OF DISCHARGE:                                HISTORY & PHYSICAL   CHIEF COMPLAINT:  Pain in my left shoulder.   HISTORY OF PRESENT ILLNESS:  This 70 year old white female who has been seen  by Dr. Simonne Come for continuing progressive problems concerning her left  shoulder.  She has pain going into the cervical spine as well as into the  upper arm and forearm.  It has been going on for nearly a half year.  She  has had two prior surgeries to the right shoulder for rotator cuff problems.  As far as her left shoulder is concerned, she has had no injury.  She has  cervical and lumbar pains but her shoulder has most affected her day to day  activities.  MRI of the shoulder showed degenerative changes of the labrum  as well as extensive rotator cuff tear with retraction of fibers of the left  shoulder.  We plan to go ahead with arthroscopy first for labrum debridement  and then open into an acromionectomy repair of the rotator cuff of the left  shoulder.   Of recent significance, the patient has had several emotional crises in her  life recently.  She ended a long-term relationship as well as her job.  This  has left her quite depressed.  However, she is much better now than she was  at the time of the occurrence.  This occurred several weeks ago.  She also  lost her father in November of last year.  Today, she is quite composed,  somewhat tearful when we discussed the above subject.  She is quite  concerned about her perioperative period.  I told her that we would  certainly take care of her and would support her as best we could.   PAST MEDICAL HISTORY:  Her family physician is doctor Dr.  Kirby Funk of  [facility] at [facility].  He has cleared her for surgery.  Her  current medications are over-the-counter vitamins, aspirin, glucosamine,  vitamin E complex, B complex, C and calcium with D.  She also is taking  Ambien to help her to sleep through these difficult times.  She will stop  the vitamin E, glucosamine prior to surgery.  She is ALLERGIC to PENICILLIN,  XYLOCAINE causes tachycardia, very sensitive to VALIUM.  The patient has a  history of migraines, had none recently.  She had some mild diverticulitis  which was diagnosed in 2002; however, she has had no problems since.  Pneumonia as a child.  She had some renal  calculi in 1961.  Surgeries  include a rotator cuff repair several times in 1993 and 1994.  She also had  a scope in 1995 or 1996 to relieve scar tissue of the right shoulder.   FAMILY HISTORY:  Positive for heart disease in her father.  Mother and a  sister with hypertension.  Both father, aunts, and uncles had diabetes.  Pancreatic cancer in the grandfather and uncle.   SOCIAL HISTORY:  The patient is divorced, retired.  Has no intake of tobacco  products.  Occasional intake of EtOH.  She has 3 children.  She lives in a  town home with 2 floors and 14 stairs.   REVIEW OF SYSTEMS:  CNS:  No seizures, stroke or paralysis, numbness, double  vision, but she does have radiculitis from the lumbar spine.  CARDIOVASCULAR:  No chest pain, no angina, no orthopnea.  RESPIRATORY:  No  productive cough, no hemoptysis, no shortness of breath.  GASTROINTESTINAL:  No nausea, vomiting, melena, or bloody stools.  GENITOURINARY:  No  discharge, dysuria, or hematuria.  MUSCULOSKELETAL:  Primarily in the  present illness.   PHYSICAL EXAMINATION:  GENERAL:  Alert and cooperative, friendly 70 year old  white female who looks younger than her stated age.  She is well groomed.  VITAL SIGNS:  Blood pressure 144/82, pulse 72, respirations 12.  HEENT:   Normocephalic.  PERRLA, EOMs intact.  Oropharynx is clear.  NECK:  Supple.  No lymphadenopathy.  CHEST:  Clear to auscultation.  No rhonchi or rales.  HEART:  Regular rate and rhythm.  No murmurs are heard.  ABDOMEN:  Soft, nontender.  Liver, spleen not felt.  GENITALIA, RECTAL, PELVIC, BREASTS:  Not done and not pertinent to the  present illness.  EXTREMITIES:  The patient is very reticent to move her left shoulder, has  painful internal and external rotation and cannot abduct beyond 90 degrees  in the left upper extremity.  She does have a fine tremor to her left hand.   ADMISSION DIAGNOSES:  1. Tear of the rotator cuff of the left shoulder also of the labrum of the     left shoulder.  2. Depression.   PLAN:  The patient will undergo left shoulder arthroscopy with possible  labral debridement and open anterior acromionectomy with repair of the  rotator cuff of the left shoulder.  I gave her a prescription today for  Ambien 10 mg and I told her that this is not a long-term prescription but is  just to help her through the preoperative and immediate postoperative  period.  I told her that if she needs to continue with any sleep  preparation, she needs to be able to get this from Dr. Valentina Lucks down the  road.  I also told her to request a block for her surgery which would assist  her in her postoperative pain control.  She plans to stay primarily just  overnight but may need an extra day until she is more comfortable as she  lives alone and will essentially be her own caregiver.     Dooley L. Cherlynn June.                 Marlowe Kays, M.D.    DLU/MEDQ  D:  08/21/2003  T:  08/21/2003  Job:  [id number]   cc:   Thora Lance, M.D.  [address]  [place]  [place] [address]  Fax: [phone]

## 2010-12-03 NOTE — Op Note (Signed)
[name], [name] NO.:  [id number]   MEDICAL RECORD NO.:  [id number]          PATIENT TYPE:  AMB   LOCATION:  [facility]                           FACILITY:  [facility]   PHYSICIAN:  Malva Limes, M.D.    DATE OF BIRTH:  January 06, 1941   DATE OF PROCEDURE:  12/19/2006  DATE OF DISCHARGE:                               OPERATIVE REPORT   PREOPERATIVE DIAGNOSES:  1. A 5-cm right ovarian cyst in a postmenopausal woman.  2. Normal CA125.   POSTOPERATIVE DIAGNOSES:  1. A 5-cm right ovarian cyst in a postmenopausal woman.  2. Normal CA125.   PROCEDURE:  1. Laparoscopic bilateral salpingo-oophorectomy.  2. Pelvic washings.   SURGEON:  Dr. Dareen Piano.   ASSISTANT:  Dr. Greta Doom.   ANESTHESIA:  General endotracheal.   ANTIBIOTICS:  Ancef 1 g.   ESTIMATED BLOOD LOSS:  Minimal.   SPECIMENS:  1. Left and right fallopian tubes and ovaries sent to pathology.  2. Pelvic washings sent to pathology.   DRAINS:  Foley bedside drainage.   COMPLICATIONS:  None.   PROCEDURE:  The patient was taken to the operating room where she was  placed in the dorsal supine position, and general anesthetic was  administered without complication. She was then placed in the dorsal  lithotomy position. She was prepped with Betadine, and Foley catheter  placed in her bladder. Hulka tenaculum was applied to the anterior  cervical lip. The patient was then draped in the usual fashion for this  procedure. Her umbilicus was injected with 0.25% Marcaine. A vertical  skin incision was made. The fascia was grasped with the Kochers and  opened with the Mayo scissors. Parietal peritoneum was entered bluntly.  A 0 Vicryl suture was placed in a pursestring fashion. The Hasson  cannula was placed into the abdominal cavity, and 3 liters of carbon  dioxide was insufflated. The patient was then placed in Trendelenburg.  On examination, the patient had no evidence of any nodules throughout  her pelvis or abdomen.  The bowel appeared to be normal. There were no  adhesions. She did have a 5-cm ovary on the right which was not adherent  to anything. There was no abnormalities on the serosal surface. The left  ovary was small and atrophic. There was evidence of past tubal ligation.  At this point, a 5-mm ports were placed in the left and right lower  quadrants and an 11-mm port placed in the suprapubic region. The left  ovary was then grasped. The infundibulopelvic ligament cauterized and  transected. The mesosalpinx was then cauterized and transected. The  ovarian ligament was cauterized and transected. The specimen was placed  in the anterior cul-de-sac. Next, the right ovary was grasped, and a  similar procedure was performed. All pedicles were found to be  hemostatic. The Endobag was placed into the suprapubic region, the left  ovary placed in the bag and then removed. The right ovary was placed in  the second bag, brought to the surface and then drained. Fluid in the  cyst appeared to be clear and yellow. The ovary was  then removed. At  this point, the pelvis was again checked for hemostasis and found to be  adequate. The patient had two small subserosal fibroids, less than a  centimeter on her anterior fundal surface. At the conclusion of the  procedure, the instruments and trocars were all removed. The fascia  closed with 0 Vicryl suture, and the skin closed with Steri-Strips. The  patient tolerated the procedure well. She was taken to the recovery room  in stable condition. She will be discharged to home. She will follow up  in the office in 2 weeks. She will be sent home with Percocet to take  p.r.n.           ______________________________  Malva Limes, M.D.     MA/MEDQ  D:  12/19/2006  T:  12/19/2006  Job:  [id number]

## 2010-12-03 NOTE — Op Note (Signed)
NAMEMARIABELEN, [name] NO.:  [id number]   MEDICAL RECORD NO.:  [id number]          PATIENT TYPE:  AMB   LOCATION:  [facility]                         FACILITY:  [facility]   PHYSICIAN:  Marlowe Kays, M.D.  DATE OF BIRTH:  Aug 26, 1940   DATE OF PROCEDURE:  08/31/2004  DATE OF DISCHARGE:                                 OPERATIVE REPORT   PREOPERATIVE DIAGNOSIS:  Torn lateral meniscus, left knee.   POSTOPERATIVE DIAGNOSIS:  1.  Torn medial and lateral menisci.  2.  Chondromalacia, medial and lateral femoral condyles, left knee.   OPERATION:  1.  Left knee arthroscopy with one partial medial and lateral      meniscectomies.  2.  Shaving of medial and lateral femoral condyle.   SURGEON:  Marlowe Kays, M.D.   ASSISTANT:  Nurse.   ANESTHESIA:  General.   INDICATIONS FOR PROCEDURE:  Problems began when she fell around the end of  November, injuring her left knee.  Because of persistent pain and swelling,  we had a knee MRI performed on July 30, 2004, which showed some  chondromalacia of the knee and a torn anterior lateral meniscus.  Because of  the persistence of her problem, she is here today for evaluation for  treatment.  See operative description below for additional pathology.   PROCEDURE:  Satisfactory general anesthetic.  The knee was esmarched out  nonsterilely with pneumatic tourniquet.  Thigh stabilizer, left leg, was  prepped with Duraprep from stabilizer to ankle, draped in sterile field.  Superomedial saline inflow.  First and anterolateral portal, medial  compartment knee joint, was evaluated.  There was a small fleck of articular  cartilage noted lying free, which I removed with a 3.5 shaver.  It appeared  to take origin from the medial femoral condyle, which had grade 2-3/4  chondromalacia, which I smoothed down.  The anterior area of the medial  meniscus had excessive synovium present, and on cleaning it out, there was a  deformity of the medial  meniscus with a bucket-handle type of tear.  I  resected this with scissors and a 3.5 shaver.  The remainder of the medial  meniscus looked intact.  Looking up in the medial gutter and suprapatellar  area, no abnormalities were noted.  I then reversed portals.  ACL was  intact.  She had badly macerated the anterior half of the lateral meniscus,  which I removed with a combination of scissors, baskets, and 3.5 shaver,  leaving basically no anterior meniscus remaining but a nice, smooth step-off  at the mid lateral portion.  In the unicondylar area, she had a small tear,  which I resected as well.  I smoothed down the lateral femoral condyle  gently with the 3.5 shaver.  The knee joint was then irrigated until clear,  and all fluid possible removed.  The two anterior portals were closed with 4-  0 nylon, then 20 cc of 0.5% plain Marcaine was then injected with 4 mg of  morphine.  Through the inflow  apparatus, __________ was removed, and this portal closed with 4-0  nylon as  well.  Betadine and dry sterile dressing were applied.  Tourniquet was  released.  She tolerated the procedure well and was taken to the recovery  room in satisfactory with no known complications.      JA/MEDQ  D:  08/31/2004  T:  08/31/2004  Job:  [id number]

## 2010-12-14 ENCOUNTER — Ambulatory Visit
Admission: RE | Admit: 2010-12-14 | Discharge: 2010-12-14 | Disposition: A | Discharge status: Home / Self Care | Payer: Medicare Other | Source: Ambulatory Visit | Attending: Internal Medicine | Admitting: Internal Medicine

## 2010-12-14 ENCOUNTER — Other Ambulatory Visit: Payer: Self-pay | Admitting: Internal Medicine

## 2010-12-14 DIAGNOSIS — R52 Pain, unspecified: Secondary | ICD-10-CM

## 2011-02-14 ENCOUNTER — Other Ambulatory Visit: Payer: Self-pay | Admitting: Dermatology

## 2011-02-28 ENCOUNTER — Ambulatory Visit (INDEPENDENT_AMBULATORY_CARE_PROVIDER_SITE_OTHER): Payer: Medicare Other | Admitting: General Surgery

## 2011-02-28 ENCOUNTER — Encounter (INDEPENDENT_AMBULATORY_CARE_PROVIDER_SITE_OTHER): Payer: Self-pay | Admitting: General Surgery

## 2011-02-28 VITALS — BP 134/86 | HR 64 | Temp 97.2°F | Ht 67.0 in | Wt 170.0 lb

## 2011-02-28 DIAGNOSIS — C4359 Malignant melanoma of other part of trunk: Secondary | ICD-10-CM | POA: Insufficient documentation

## 2011-02-28 NOTE — Patient Instructions (Signed)
Obtain lymphoscintogram Plan for wide excision and sentinel node mapping

## 2011-02-28 NOTE — Progress Notes (Signed)
Subjective:     Patient ID: [name], female   DOB: [birth year], 70 y.o.   MRN: [id number]  HPI We are asked to see the patient in consultation by Dr. Campbell Stall to evaluate her for a melanoma on her back. The patient is a 70 year old white female who has had lots of precancerous lesions removed from her skin over the last 25 years or so. She recently had one removed from her mid back that came back as melanoma on her final pathology. The depth of the tumor was a roughly 1 mm. She had not had any bleeding from the area. She had noticed some pins and needles-like sensation in the area over the last several months. She actually had her first melanoma diagnosed in January on her right neck. This was removed by a skin surgeon here in [place]. She otherwise has felt well. She has no complaints. Her weight has been stable. Her appetite is good. She does have some problems with diarrhea is secondary to her IBS. Review of Systems  Constitutional: Negative.   HENT: Negative.   Eyes: Negative.   Respiratory: Negative.   Cardiovascular: Negative.   Gastrointestinal: Negative.   Genitourinary: Negative.   Musculoskeletal: Negative.   Skin: Negative.   Neurological: Negative.   Hematological: Negative.   Psychiatric/Behavioral: Negative.    Past Medical History  Diagnosis Date  . Weight gain   . Fatigue   . Atypical moles   . Bronchitis   . IBD (inflammatory bowel disease)   . Dizziness   . Generalized headache   . Retina disorder     left  . Arthritis   . Anemia    Past Surgical History  Procedure Date  . Shoulder surgery     right  . Knee arthroscopy   . Closed manipulation shoulder     rt  . Rotator cuff repair     left  . Tumor removal     ovary  . Total knee arthroplasty     right   Current outpatient prescriptions:Ascorbic Acid (VITAMIN C WITH ROSE HIPS) 500 MG tablet, Take 500 mg by mouth daily.  , Disp: , Rfl: ;  aspirin 81 MG tablet, Take 81 mg by mouth daily.  ,  Disp: , Rfl: ;  Calcium-Magnesium-Vitamin D (CALCIUM MAGNESIUM PO), Take by mouth 2 (two) times daily.  , Disp: , Rfl: ;  CINNAMON PO, Take 1,000 mg by mouth daily.  , Disp: , Rfl: ;  Coenzyme Q10 (COQ10 PO), Take by mouth daily.  , Disp: , Rfl:  Cyanocobalamin (B-12) 2500 MCG TABS, Take by mouth daily.  , Disp: , Rfl: ;  FA-BENFOTIAMINE-PABA-E-LIPOIC PO, Take 150 mg by mouth daily.  , Disp: , Rfl: ;  folic acid (FOLVITE) 400 MCG tablet, Take 400 mcg by mouth daily.  , Disp: , Rfl: ;  ibuprofen (ADVIL,MOTRIN) 200 MG tablet, Take 200 mg by mouth every 6 (six) hours as needed.  , Disp: , Rfl: ;  meclizine (ANTIVERT) 25 MG tablet, Ad lib., Disp: , Rfl:  Omega-3 Fatty Acids (FISH OIL) 1200 MG CAPS, Take by mouth daily.  , Disp: , Rfl: ;  Omeprazole Magnesium 20.6 (20 BASE) MG CPDR, Take by mouth daily.  , Disp: , Rfl: ;  PROAIR HFA 108 (90 BASE) MCG/ACT inhaler, Ad lib., Disp: , Rfl:   Allergies  Allergen Reactions  . Epinephrine   . Penicillins   . Valium         Objective:  Physical Exam  Constitutional: She is oriented to person, place, and time. She appears well-developed and well-nourished.  HENT:  Head: Normocephalic and atraumatic.  Eyes: Conjunctivae and EOM are normal. Pupils are equal, round, and reactive to light.  Neck: Normal range of motion. Neck supple.       No palpable cervical adenopathy  Cardiovascular: Normal rate, regular rhythm and normal heart sounds.   Pulmonary/Chest: Effort normal and breath sounds normal.       No palpable axillary adenopathy  Abdominal: Soft. Bowel sounds are normal.       No palpable groin adenopathy  Musculoskeletal: Normal range of motion.  Neurological: She is alert and oriented to person, place, and time.  Skin: Skin is warm and dry.       On her mid back just to the left of midline she has a small scab area about 2 mm in diameter  Psychiatric: She has a normal mood and affect. Her behavior is normal.       Assessment:       Intermediate thickness melanoma on the mid back.    Plan:     At this point I think she needs a lymphoscintigram to help define the lymph node basin that needs to be evaluated. Once this is established then I think she will need a wide excision of the melanoma and sentinel node biopsy of the define lymph node basin. I've discussed with her in detail the risks and benefits of the operation as well as some of the technical aspects and she understands and wishes to proceed.

## 2011-03-03 ENCOUNTER — Encounter (HOSPITAL_COMMUNITY)
Admission: RE | Admit: 2011-03-03 | Discharge: 2011-03-03 | Disposition: A | Discharge status: Home / Self Care | Payer: Medicare Other | Source: Ambulatory Visit | Attending: General Surgery | Admitting: General Surgery

## 2011-03-03 ENCOUNTER — Other Ambulatory Visit (HOSPITAL_COMMUNITY): Payer: Medicare Other

## 2011-03-03 DIAGNOSIS — C4359 Malignant melanoma of other part of trunk: Secondary | ICD-10-CM

## 2011-03-03 MED ORDER — TECHNETIUM TC 99M SULFUR COLLOID FILTERED
0.5000 | Freq: Once | INTRAVENOUS | Status: AC | PRN
  Administered 2011-03-03: 0.5 via INTRADERMAL

## 2011-03-08 ENCOUNTER — Telehealth (INDEPENDENT_AMBULATORY_CARE_PROVIDER_SITE_OTHER): Payer: Self-pay | Admitting: General Surgery

## 2011-03-08 NOTE — Telephone Encounter (Signed)
Message copied by Lannie Fields on Tue Mar 08, 2011  3:02 PM ------      Message from: Jari Sportsman      Created: Mon Mar 07, 2011  3:30 PM      Contact: [phone]       Ms Mera Gunkel called to schedule her surgery. Please advise      Thanks       Florentina Addison

## 2011-03-08 NOTE — Telephone Encounter (Signed)
I SPOKE WITH PT THIS AM RE HER LYMPHOSCINTOGRAM RESULTS AND OR POSTING SHEET IS COMPLETE.

## 2011-03-09 ENCOUNTER — Other Ambulatory Visit (INDEPENDENT_AMBULATORY_CARE_PROVIDER_SITE_OTHER): Payer: Self-pay | Admitting: General Surgery

## 2011-03-09 ENCOUNTER — Encounter (INDEPENDENT_AMBULATORY_CARE_PROVIDER_SITE_OTHER): Payer: Self-pay | Admitting: Dermatology

## 2011-03-09 ENCOUNTER — Telehealth (INDEPENDENT_AMBULATORY_CARE_PROVIDER_SITE_OTHER): Payer: Self-pay | Admitting: General Surgery

## 2011-03-09 DIAGNOSIS — C4359 Malignant melanoma of other part of trunk: Secondary | ICD-10-CM

## 2011-03-14 ENCOUNTER — Encounter (HOSPITAL_COMMUNITY)
Admission: RE | Admit: 2011-03-14 | Discharge: 2011-03-14 | Disposition: A | Discharge status: Home / Self Care | Payer: Medicare Other | Source: Ambulatory Visit | Attending: General Surgery | Admitting: General Surgery

## 2011-03-14 ENCOUNTER — Other Ambulatory Visit (INDEPENDENT_AMBULATORY_CARE_PROVIDER_SITE_OTHER): Payer: Self-pay | Admitting: General Surgery

## 2011-03-14 DIAGNOSIS — Z01811 Encounter for preprocedural respiratory examination: Secondary | ICD-10-CM

## 2011-03-14 LAB — DIFFERENTIAL
Basophils Absolute: 0 10*3/uL (ref 0.0–0.1)
Basophils Relative: 0 % (ref 0–1)
Eosinophils Absolute: 0.1 10*3/uL (ref 0.0–0.7)
Eosinophils Relative: 2 % (ref 0–5)
Lymphocytes Relative: 36 % (ref 12–46)
Lymphs Abs: 2.3 10*3/uL (ref 0.7–4.0)
Monocytes Absolute: 0.5 10*3/uL (ref 0.1–1.0)
Monocytes Relative: 8 % (ref 3–12)
Neutro Abs: 3.5 10*3/uL (ref 1.7–7.7)
Neutrophils Relative %: 55 % (ref 43–77)

## 2011-03-14 LAB — CBC
HCT: 40.9 % (ref 36.0–46.0)
Hemoglobin: 14 g/dL (ref 12.0–15.0)
MCH: 30.7 pg (ref 26.0–34.0)
MCHC: 34.2 g/dL (ref 30.0–36.0)
MCV: 89.7 fL (ref 78.0–100.0)
Platelets: 196 10*3/uL (ref 150–400)
RBC: 4.56 MIL/uL (ref 3.87–5.11)
RDW: 12.5 % (ref 11.5–15.5)
WBC: 6.4 10*3/uL (ref 4.0–10.5)

## 2011-03-14 LAB — SURGICAL PCR SCREEN
MRSA, PCR: NEGATIVE
Staphylococcus aureus: NEGATIVE

## 2011-03-14 LAB — COMPREHENSIVE METABOLIC PANEL
ALT: 12 U/L (ref 0–35)
AST: 14 U/L (ref 0–37)
Albumin: 4 g/dL (ref 3.5–5.2)
Alkaline Phosphatase: 70 U/L (ref 39–117)
BUN: 17 mg/dL (ref 6–23)
CO2: 29 mEq/L (ref 19–32)
Calcium: 9.7 mg/dL (ref 8.4–10.5)
Chloride: 106 mEq/L (ref 96–112)
Creatinine, Ser: 0.75 mg/dL (ref 0.50–1.10)
GFR calc Af Amer: >60 mL/min (ref >60)
GFR calc non Af Amer: >60 mL/min (ref >60)
Glucose, Bld: 87 mg/dL (ref 70–99)
Potassium: 4.8 mEq/L (ref 3.5–5.1)
Sodium: 142 mEq/L (ref 135–145)
Total Bilirubin: 0.8 mg/dL (ref 0.3–1.2)
Total Protein: 6.7 g/dL (ref 6.0–8.3)

## 2011-03-18 ENCOUNTER — Ambulatory Visit (HOSPITAL_COMMUNITY)
Admission: RE | Admit: 2011-03-18 | Discharge: 2011-03-18 | Disposition: A | Discharge status: Home / Self Care | Payer: Medicare Other | Source: Ambulatory Visit | Attending: General Surgery | Admitting: General Surgery

## 2011-03-18 ENCOUNTER — Other Ambulatory Visit (INDEPENDENT_AMBULATORY_CARE_PROVIDER_SITE_OTHER): Payer: Self-pay | Admitting: General Surgery

## 2011-03-18 DIAGNOSIS — C4359 Malignant melanoma of other part of trunk: Secondary | ICD-10-CM

## 2011-03-18 DIAGNOSIS — K219 Gastro-esophageal reflux disease without esophagitis: Secondary | ICD-10-CM | POA: Insufficient documentation

## 2011-03-18 DIAGNOSIS — M2669 Other specified disorders of temporomandibular joint: Secondary | ICD-10-CM | POA: Insufficient documentation

## 2011-03-18 DIAGNOSIS — Z01812 Encounter for preprocedural laboratory examination: Secondary | ICD-10-CM | POA: Insufficient documentation

## 2011-03-18 DIAGNOSIS — Z01810 Encounter for preprocedural cardiovascular examination: Secondary | ICD-10-CM | POA: Insufficient documentation

## 2011-03-18 DIAGNOSIS — Z01818 Encounter for other preprocedural examination: Secondary | ICD-10-CM | POA: Insufficient documentation

## 2011-03-18 HISTORY — PX: MELANOMA EXCISION WITH SENTINEL LYMPH NODE BIOPSY: SHX5267

## 2011-03-18 MED ORDER — TECHNETIUM TC 99M SULFUR COLLOID FILTERED
0.5000 | Freq: Once | INTRAVENOUS | Status: AC | PRN
  Administered 2011-03-18: 0.5 via INTRADERMAL

## 2011-03-22 ENCOUNTER — Telehealth (INDEPENDENT_AMBULATORY_CARE_PROVIDER_SITE_OTHER): Payer: Self-pay | Admitting: General Surgery

## 2011-03-28 ENCOUNTER — Ambulatory Visit (INDEPENDENT_AMBULATORY_CARE_PROVIDER_SITE_OTHER): Payer: Medicare Other | Admitting: General Surgery

## 2011-03-28 DIAGNOSIS — C4359 Malignant melanoma of other part of trunk: Secondary | ICD-10-CM

## 2011-03-28 NOTE — Patient Instructions (Signed)
Shower daily Change gauze dressing over wound after each shower

## 2011-03-29 ENCOUNTER — Encounter (INDEPENDENT_AMBULATORY_CARE_PROVIDER_SITE_OTHER): Payer: Self-pay | Admitting: General Surgery

## 2011-03-29 NOTE — Op Note (Signed)
[name], [name] NO.:  [id number]  MEDICAL RECORD NO.:  [id number]  LOCATION:  [facility]                         FACILITY:  [facility]  PHYSICIAN:  Ollen Gross. Vernell Morgans, M.D. DATE OF BIRTH:  Nov 18, 1940  DATE OF PROCEDURE:  03/18/2011 DATE OF DISCHARGE:  03/18/2011                              OPERATIVE REPORT   PREOPERATIVE DIAGNOSIS:  Melanoma of the back.  POSTOPERATIVE DIAGNOSIS:  Melanoma of the back.  PROCEDURE:  Injection of blue dye, sentinel lymph node biopsy of the left axilla x2, and then wide excision of melanoma of the back with primary closure.  SURGEON:  Ollen Gross. Vernell Morgans, MD  ANESTHESIA:  General endotracheal.  PROCEDURE:  After informed consent was obtained, the patient was brought to the operating room, placed in supine position on the operating room table.  After adequate induction of general anesthesia, the patient was then rolled up on her side.  Earlier in the day, the patient had undergone injection of 1 mCi of technetium sulfur colloid beneath at the site of the melanoma on her back.  At this point, 2 mL of methylene blue and 3 mL of injectable saline were also injected around the site of the melanoma on her back.  She was then placed in the supine position on the operating room table with the arms out.  Her left axilla had been previously shown to be the site of the drainage from the melanoma.  So her left axillary and chest area were prepped with ChloraPrep and allowed to dry and draped in the usual sterile manner.  The Neoprobe was used to identify a hot spot in the left axilla.  A small transverse incision was made with a 15 blade knife.  This incision was carried down through the skin and subcutaneous tissue sharply with electrocautery until the axilla was entered.  Using the Neoprobe for dissection, we were able to identify two hot lymph nodes neither of which were blue. Ex vivo counts on sentinel node #1 were around 1000, ex vivo  counts on sentinel node #2 were around 200.  These were both excised by combination of some sharp Bovie dissection.  The lymphatics were then clamped with hemostats, divided and ligated with 3-0 Vicryl ties.  Once these were removed, the axilla was examined and found to be hemostatic. No other hot, blue or palpable lymph nodes were identified.  These were sent as sentinel node #1 and 2.  The deep layer of the axilla was then closed with interrupted 3-0 Vicryl stitches and the skin was closed with running 4-0 Monocryl subcuticular stitch.  Dermabond dressing was applied.  At this point, the drapes were removed.  The patient was then moved into the supine position back on stretcher and then rolled into a prone position on the operating room table.  All pressure points were padded.  Her back was then prepped with ChloraPrep, allowed to dry and draped in usual sterile manner with wide excision with a 2 cm margin all around the melanoma was mapped out in an elliptical fashion.  The incision was then made along the mapped outlines with a 15 blade knife. This incision was carried  down through the skin and subcutaneous tissue sharply with electrocautery until the fascia of the back muscle was identified.  The specimen was dissected off the fascia of the back with some fascia.  Once the specimen was completely removed, it was oriented with a short double stitch on the right, long double stitch on the left, and single stitch superior.  It was then sent to pathology for further evaluation.  The incision was then undermined just a little bit at the level of the fascia of the bag.  Hemostasis was achieved using the Bovie electrocautery.  Wound was irrigated with copious amounts of saline. The deep layer was then closed with interrupted 3-0 Vicryl stitches and then the skin was closed with interrupted 4-0 Monocryl subcuticular stitches.  A Dermabond dressing was applied.  The patient tolerated the  procedure well.  At the end of the case, all needle, sponge and instrument counts were correct. The patient was then awakened and taken to recovery in stable condition.     Ollen Gross. Vernell Morgans, M.D.     PST/MEDQ  D:  03/18/2011  T:  03/18/2011  Job:  [id number]  Electronically Signed by Chevis Pretty III M.D. on 03/29/2011 09:24:16 AM

## 2011-03-29 NOTE — Progress Notes (Signed)
Subjective:     Patient ID: [name], female   DOB: 22-May-1941, 70 y.o.   MRN: [id number]  HPI The pt is a 70 yo wf who is 10 days out from a wide excision of melanoma from mid back with neg sentinel node bx. She was bending over to pick up something today and felt the incision pop. No fever. No drainage  Review of Systems     Objective:   Physical Exam On exam most of the incision is still closed except for about 2-3 cm of the central part of incision which has separated. No sign of infection    Assessment:     10 days postop with wound dehiscence    Plan:     Start daily dressing changes to area. Continue showering and cleaning wound. F/U in 1 week

## 2011-03-31 ENCOUNTER — Ambulatory Visit (INDEPENDENT_AMBULATORY_CARE_PROVIDER_SITE_OTHER): Payer: Medicare Other | Admitting: General Surgery

## 2011-03-31 DIAGNOSIS — C4359 Malignant melanoma of other part of trunk: Secondary | ICD-10-CM

## 2011-03-31 NOTE — Patient Instructions (Signed)
Continue to shower and change gauze daily

## 2011-04-04 ENCOUNTER — Telehealth (INDEPENDENT_AMBULATORY_CARE_PROVIDER_SITE_OTHER): Payer: Self-pay

## 2011-04-04 NOTE — Telephone Encounter (Signed)
Pt called and said that she is still having some drainage that was a cream color but wasn't sure if it was yellow or looked like it could be infected. She also wasn't sure if it had an odor to it. I told her next time she does her dressing change to pay close attention to the color and try and see if there is an odor to it to help determine if there might be signs of infection. I told her i would have Germaine call her Tuesday and check up on her.

## 2011-04-06 NOTE — Telephone Encounter (Signed)
I SPOKE WITH MS [place] AND SHE HAS AN APPT TO SEE NURSE TOMORROW IN OFFICE. 04-07-11.

## 2011-04-07 ENCOUNTER — Encounter (INDEPENDENT_AMBULATORY_CARE_PROVIDER_SITE_OTHER): Payer: Medicare Other

## 2011-04-11 ENCOUNTER — Ambulatory Visit (INDEPENDENT_AMBULATORY_CARE_PROVIDER_SITE_OTHER): Payer: Medicare Other | Admitting: General Surgery

## 2011-04-11 DIAGNOSIS — C4359 Malignant melanoma of other part of trunk: Secondary | ICD-10-CM

## 2011-04-11 NOTE — Progress Notes (Signed)
PT HERE FOR WOUND CK. OPEN WOUND ON MID BACK. WOUND SMALLER IN LENGTH AND MORE SHALLOW. 2X2 GAUZE HAD SMALL AMT YELLOW DRAINAGE. 2X2 GAUZE REAPPLIED.

## 2011-04-12 ENCOUNTER — Ambulatory Visit (INDEPENDENT_AMBULATORY_CARE_PROVIDER_SITE_OTHER): Payer: Medicare Other | Admitting: General Surgery

## 2011-04-12 NOTE — Progress Notes (Signed)
PT HERE FOR WOUND RECK. 2X2 GAUZE REMOVED WITH SMALL AMT YELLOW DRAINAGE NOTED. WOUND CONTINUES TO CLOSE IN. MORE SHALLOW. DR. Carolynne Edouard LOOKED AT WOUND ALSO AND WAS PLEASED WITH HEALING PROCESS. 2X2 STERILE GAUZE PLACED OVER OPENING. PT TO BE SEEN NEXT WEEK FOR RCK.

## 2011-04-13 ENCOUNTER — Encounter (INDEPENDENT_AMBULATORY_CARE_PROVIDER_SITE_OTHER): Payer: Medicare Other

## 2011-04-14 LAB — D-DIMER, QUANTITATIVE (NOT AT ARMC): D-Dimer, Quant: 0.39

## 2011-04-21 ENCOUNTER — Ambulatory Visit (INDEPENDENT_AMBULATORY_CARE_PROVIDER_SITE_OTHER): Payer: Medicare Other

## 2011-04-21 DIAGNOSIS — C4359 Malignant melanoma of other part of trunk: Secondary | ICD-10-CM

## 2011-04-21 NOTE — Progress Notes (Signed)
[name] IS HERE TO HAVE BACK WOUND CHECKED. OPENING APPROXIMATELY 3CM WIDE AND MORE SHALLOW THEN LAST VISIT. MINIMAL PINK TINGED DRAINAGE ON DRESSING. WOUND CLEANED WITH STERILE NORMAL SALINE AND REPACKED WITH STERILE 2X2 GAUZE. PT HAS F/U WITH DR. TOTH 05-03-11.

## 2011-04-26 ENCOUNTER — Encounter (INDEPENDENT_AMBULATORY_CARE_PROVIDER_SITE_OTHER): Payer: Self-pay | Admitting: General Surgery

## 2011-04-26 NOTE — Progress Notes (Signed)
Subjective:     Patient ID: [name], female   DOB: 08-31-1940, 70 y.o.   MRN: [id number]  HPI The patient is a 70 year old white female who is now 2 weeks out from a wide excision of a melanoma from her back. 2 or 3 days ago she been over to pick up something and popped the midportion of her incision opened. She's been doing dressing changes to the area and comes back in today for a wound check. She has no complaints today. Review of Systems     Objective:   Physical Exam On exam the open portion of the incision is clean. There is the beginning of some granulation tissue. We redressed the wound today and she tolerated this well.    Assessment:     2 weeks postop from a wide excision of a melanoma with some dehiscence of the incision.    Plan:     She will continue to shower daily and change the dressing daily. We will plan to see her back in the next week or 2 for another wound check. We will also go ahead and make her a referral to a medical oncologist.

## 2011-05-03 ENCOUNTER — Ambulatory Visit (INDEPENDENT_AMBULATORY_CARE_PROVIDER_SITE_OTHER): Payer: Medicare Other | Admitting: General Surgery

## 2011-05-03 ENCOUNTER — Encounter (INDEPENDENT_AMBULATORY_CARE_PROVIDER_SITE_OTHER): Payer: Self-pay | Admitting: General Surgery

## 2011-05-03 VITALS — BP 146/80 | HR 60 | Temp 98.9°F | Resp 12 | Ht 67.0 in | Wt 168.0 lb

## 2011-05-03 DIAGNOSIS — C4359 Malignant melanoma of other part of trunk: Secondary | ICD-10-CM

## 2011-05-03 NOTE — Patient Instructions (Signed)
Keep area clean and dry. °

## 2011-05-03 NOTE — Progress Notes (Signed)
Subjective:     Patient ID: [name], female   DOB: January 29, 1941, 70 y.o.   MRN: [id number]  HPI The patient is a 70 year old white female who is now several weeks status post wide excision of the melanoma from the back with a negative sentinel node. Her postoperative course was complicated by some dehiscence of the skin incision. She has been doing dressing changes to the area and has been tolerating this well. She has no complaints today.  Review of Systems     Objective:   Physical Exam On exam her wound on her back is almost completely healed now. The open area is now covered by scab. The rest of the incision has healed nicely.    Assessment:     Several weeks status post wide excision of a melanoma from the back.    Plan:     At this point we will make her referral to the medical oncologist to see if any further adjuvant therapy is necessary. I will see her back in a couple months to check her progress.

## 2011-05-05 LAB — SAMPLE TO BLOOD BANK

## 2011-05-10 ENCOUNTER — Encounter: Payer: Medicare Other | Admitting: Internal Medicine

## 2011-05-14 ENCOUNTER — Encounter: Payer: Self-pay | Admitting: *Deleted

## 2011-05-23 ENCOUNTER — Other Ambulatory Visit: Payer: Medicare Other | Admitting: Lab

## 2011-05-23 ENCOUNTER — Ambulatory Visit: Payer: Medicare Other

## 2011-05-23 ENCOUNTER — Ambulatory Visit (HOSPITAL_BASED_OUTPATIENT_CLINIC_OR_DEPARTMENT_OTHER): Payer: Medicare Other | Admitting: Internal Medicine

## 2011-05-23 VITALS — BP 161/84 | HR 68 | Temp 98.6°F | Ht 65.5 in | Wt 175.9 lb

## 2011-05-23 DIAGNOSIS — C439 Malignant melanoma of skin, unspecified: Secondary | ICD-10-CM

## 2011-05-23 DIAGNOSIS — C4359 Malignant melanoma of other part of trunk: Secondary | ICD-10-CM

## 2011-05-23 NOTE — Progress Notes (Signed)
REASON FOR CONSULTATION:  Recent diagnosis was melanoma.  HPI [name] is a 70 y.o. female. The patient was referred to me today at the courtesy of Dr. Carolynne Edouard after she was diagnosed with melanoma. She mentions that in January of 2012 she was diagnosed with melanoma in the right neck. The final pathology was consistent with melanoma in situ, arising in a nevus. In July of 2012, the patient was found to have a suspicious lesion on the lower back to the left of the midline. Biopsy was consistent with melanoma.the patient was referred to Dr. Carolynne Edouard. She underwent wide excision with sentinel lymph node biopsy on 03/18/2011. The final pathology was consistent with nodular melanoma,with Clark level III, and breslow's measurement of 0.93 mm, focally brisk host response, absent regression, no ulceration, no satellitosis, and no vascular invasion. The sentinel lymph nodes from the left axilla were negative for malignancy. The patient is recovering well from her recent surgery and her wound is healing by granulation. She was referred to me today for evaluation and recommendations regarding treatment of her condition. When seen today, the patient denied having any significant complaints. She denied having any significant chest pain or shortness of breath. She has no nausea or vomiting, no weight loss or night sweats.    Past Medical History  Diagnosis Date  . Weight gain   . Fatigue   . Atypical moles   . Bronchitis   . IBD (inflammatory bowel disease)   . Dizziness   . Generalized headache   . Retina disorder     left  . Arthritis   . Anemia     Past Surgical History  Procedure Date  . Shoulder surgery     right  . Knee arthroscopy   . Closed manipulation shoulder     rt  . Rotator cuff repair     left  . Tumor removal     ovary  . Total knee arthroplasty     right  . Melanoma excision with sentinel lymph node biopsy 03/18/11    Back, nodes neg    Family History  Problem Relation Age  of Onset  . Hypertension Mother   . Stroke Mother   . Heart disease Father   . Diabetes Father   . Hypertension Father   . Hyperlipidemia Father   . Diabetes Sister   . Heart disease Sister     Social History History  Substance Use Topics  . Smoking status: Never Smoker   . Smokeless tobacco: Not on file  . Alcohol Use: Yes    Allergies  Allergen Reactions  . Epinephrine Anaphylaxis  . Penicillins Anaphylaxis  . Valium Other (See Comments)    Valium " knocks me out"    Current Outpatient Prescriptions  Medication Sig Dispense Refill  . Ascorbic Acid (VITAMIN C WITH ROSE HIPS) 500 MG tablet Take 500 mg by mouth daily.        Marland Kitchen aspirin 81 MG tablet Take 81 mg by mouth daily.        . Calcium-Magnesium-Vitamin D (CALCIUM MAGNESIUM PO) Take by mouth 2 (two) times daily.        Marland Kitchen CINNAMON PO Take 1,000 mg by mouth daily.        . Coenzyme Q10 (COQ10 PO) Take by mouth daily.        . Cyanocobalamin (B-12) 2500 MCG TABS Take by mouth daily.        Marland Kitchen FA-BENFOTIAMINE-PABA-E-LIPOIC PO Take 150 mg by mouth daily.        Marland Kitchen  folic acid (FOLVITE) 400 MCG tablet Take 400 mcg by mouth daily.        Marland Kitchen ibuprofen (ADVIL,MOTRIN) 200 MG tablet Take 200 mg by mouth every 6 (six) hours as needed.        . meclizine (ANTIVERT) 25 MG tablet Ad lib.      . Omega-3 Fatty Acids (FISH OIL) 1200 MG CAPS Take by mouth daily.        . Omeprazole Magnesium 20.6 (20 BASE) MG CPDR Take by mouth daily.        Marland Kitchen PROAIR HFA 108 (90 BASE) MCG/ACT inhaler Ad lib.        Review of Systems: was negative today.  Blood pressure 161/84, pulse 68, temperature 98.6 F (37 C), temperature source Oral, height 5' 5.5" (1.664 m), weight 79.788 kg (175 lb 14.4 oz).  Physical Exam ZOX:WRUEA, healthy and well developed SKIN: skin color, texture, turgor are normal, no rashes or significant lesions HEAD: Normocephalic, No masses, lesions, tenderness or abnormalities EYES: normal, Conjunctiva are pink and  non-injected EARS: External ears normal OROPHARYNX:no exudate, no erythema and lips, buccal mucosa, and tongue normal  NECK: supple, no adenopathy LYMPH:  no palpable lymphadenopathy BREAST:not examined LUNGS: clear to auscultation and percussion HEART: regular rate & rhythm, no murmurs and no gallops ABDOMEN:abdomen soft, non-tender and normal bowel sounds BACK: Back symmetric, no curvature. Healing wound. EXTREMITIES:no edema, no skin discoloration, no clubbing  NEURO: alert & oriented x 3 with fluent speech, no focal motor/sensory deficits   Data Reviewed Pathology report Imaging studies  Assessment This is a very pleasant 70 years old white female recently diagnosed with a stage I A. Malignant melanoma, she status post wide excision and sentinel lymph node biopsy from the left axilla. The patient is recovering well from her surgery with no other significant complaints today.  Plan: I have a lenghty discussion with the patient today about her disease stage, prognosis and treatment options. I explained to the patient that there is no benefit for adjuvant immunotherapy for patients with stage I malignant melanoma, and the current standard of care is observation. I would see the patient back for followup visit in 6 months with repeat CBC, comprehensive metabolic panel and LDH. I would also order a chest x-ray before her upcoming visit. The patient was advised to use sunscreen and to continue her routine followup visit with her dermatologist. I gave the patient a time to ask questions and i answered them completely to her satisfaction. She was advised to call immediately if she has any concern or questions in the interval. Thank you so much for allowing me to participate in the care of [name]. I would continue to follow up the patient  with you and assess in her management.  Time spent with the patient during this visit was 50 minutes with more than half the time and consultation and  discussion of her treatment options.    Damione Robideau K. 05/23/2011, 9:00 PM

## 2011-05-26 ENCOUNTER — Telehealth: Payer: Self-pay | Admitting: Internal Medicine

## 2011-05-27 ENCOUNTER — Telehealth: Payer: Self-pay | Admitting: Internal Medicine

## 2011-05-27 NOTE — Telephone Encounter (Signed)
void

## 2011-06-23 ENCOUNTER — Encounter (INDEPENDENT_AMBULATORY_CARE_PROVIDER_SITE_OTHER): Payer: Medicare Other | Admitting: General Surgery

## 2011-07-18 ENCOUNTER — Ambulatory Visit (INDEPENDENT_AMBULATORY_CARE_PROVIDER_SITE_OTHER): Payer: Medicare Other | Admitting: General Surgery

## 2011-07-18 DIAGNOSIS — N644 Mastodynia: Secondary | ICD-10-CM | POA: Insufficient documentation

## 2011-07-18 DIAGNOSIS — C4359 Malignant melanoma of other part of trunk: Secondary | ICD-10-CM

## 2011-07-19 DIAGNOSIS — C439 Malignant melanoma of skin, unspecified: Secondary | ICD-10-CM

## 2011-07-19 HISTORY — DX: Malignant melanoma of skin, unspecified: C43.9

## 2011-07-20 ENCOUNTER — Encounter (INDEPENDENT_AMBULATORY_CARE_PROVIDER_SITE_OTHER): Payer: Self-pay | Admitting: General Surgery

## 2011-07-20 NOTE — Progress Notes (Signed)
Subjective:     Patient ID: [name], female   DOB: [birth year], 71 y.o.   MRN: [id number]  HPI The patient is a 71 year old white female who is now about 3 months out from an excision of a melanoma from the back and left axillary sentinel node biopsy that was negative. Her only complaint is of some soreness and fullness in the posterior axilla on the left.  Review of Systems     Objective:   Physical Exam On exam her incision is healing nicely. There are no new surrounding lesions. There is no axillary supraclavicular or cervical lymphadenopathy. Abdomen is soft and nontender with no palpable mass. Lungs are clear bilaterally with the use of assesory respiratory muscles.    Assessment:     3 months out from a wide excision of a melanoma from the back    Plan:     At this point she seems to be doing well. Have encouraged her to add some lotion to her scar to help soften it up. The fullness she feels in the left axilla may simply be some prominent fat or possible lipoma in the posterior axillary fold. She is also experiencing some left breast pain. We will plan to obtain a mammogram and ultrasound of the breast and the left axilla to make sure there is nothing else going on. We will follow her up in the next couple weeks.

## 2011-08-12 ENCOUNTER — Encounter: Payer: Self-pay | Admitting: Internal Medicine

## 2011-08-16 ENCOUNTER — Ambulatory Visit (INDEPENDENT_AMBULATORY_CARE_PROVIDER_SITE_OTHER): Payer: MEDICARE | Admitting: General Surgery

## 2011-08-16 ENCOUNTER — Encounter (INDEPENDENT_AMBULATORY_CARE_PROVIDER_SITE_OTHER): Payer: Self-pay | Admitting: General Surgery

## 2011-08-16 DIAGNOSIS — C4359 Malignant melanoma of other part of trunk: Secondary | ICD-10-CM

## 2011-08-16 DIAGNOSIS — R1032 Left lower quadrant pain: Secondary | ICD-10-CM | POA: Insufficient documentation

## 2011-08-18 ENCOUNTER — Other Ambulatory Visit: Payer: Self-pay

## 2011-08-18 ENCOUNTER — Encounter (INDEPENDENT_AMBULATORY_CARE_PROVIDER_SITE_OTHER): Payer: Self-pay | Admitting: General Surgery

## 2011-08-18 NOTE — Progress Notes (Signed)
Subjective:     Patient ID: [name], female   DOB: 11/13/1940, 71 y.o.   MRN: [id number]  HPI The patient is a 71 year old white female who is now about 4 months out from an excision of a melanoma from the back with a negative sentinel node mapping. Since her last visit she has been back to her dermatologist and has undergone some chemical treatment of some lesions on both hands. She seems to be tolerating this well. She also complains of some left lower quadrant abdominal pain. She denies any nausea or vomiting. Her appetite has been good and her bowels are working normally.  Review of Systems  Constitutional: Negative.   HENT: Negative.   Eyes: Negative.   Respiratory: Negative.   Cardiovascular: Negative.   Gastrointestinal: Positive for abdominal pain.  Genitourinary: Negative.   Musculoskeletal: Negative.   Skin: Positive for rash.  Neurological: Negative.   Hematological: Negative.   Psychiatric/Behavioral: Negative.        Objective:   Physical Exam  Constitutional: She is oriented to person, place, and time. She appears well-developed and well-nourished.  HENT:  Head: Normocephalic and atraumatic.  Eyes: Conjunctivae and EOM are normal. Pupils are equal, round, and reactive to light.  Neck: Normal range of motion. Neck supple.  Cardiovascular: Normal rate, regular rhythm and normal heart sounds.   Pulmonary/Chest: Effort normal and breath sounds normal.       The incision on her back has healed nicely. There is no palpable mass or satellite lesions.  Abdominal: Soft. Bowel sounds are normal.       Mild tenderness of the left lower quadrant. No palpable mass or hepatosplenomegaly  Musculoskeletal: Normal range of motion.  Neurological: She is alert and oriented to person, place, and time.  Skin: Skin is warm and dry.  Psychiatric: She has a normal mood and affect. Her behavior is normal.       Assessment:     4 months status post wide excision of a melanoma  from the back.    Plan:     At this point she seems to have healed well. We will plan to obtain a CT scan of her abdomen and pelvis to evaluate the left lower quadrant pain. Otherwise we will see her back in about 3 months

## 2011-08-23 ENCOUNTER — Ambulatory Visit
Admission: RE | Admit: 2011-08-23 | Discharge: 2011-08-23 | Disposition: A | Discharge status: Home / Self Care | Payer: Self-pay | Source: Ambulatory Visit | Attending: General Surgery | Admitting: General Surgery

## 2011-08-23 DIAGNOSIS — R1032 Left lower quadrant pain: Secondary | ICD-10-CM

## 2011-08-23 MED ORDER — IOHEXOL 300 MG/ML  SOLN
125.0000 mL | Freq: Once | INTRAMUSCULAR | Status: AC | PRN
  Administered 2011-08-23: 125 mL via INTRAVENOUS

## 2011-08-26 ENCOUNTER — Telehealth (INDEPENDENT_AMBULATORY_CARE_PROVIDER_SITE_OTHER): Payer: Self-pay | Admitting: General Surgery

## 2011-08-26 NOTE — Telephone Encounter (Signed)
I CALLED [facility] TO REVIEW CT SCAN RESULTS/GY

## 2011-11-07 ENCOUNTER — Ambulatory Visit (INDEPENDENT_AMBULATORY_CARE_PROVIDER_SITE_OTHER): Payer: MEDICARE | Admitting: General Surgery

## 2011-11-10 ENCOUNTER — Ambulatory Visit (INDEPENDENT_AMBULATORY_CARE_PROVIDER_SITE_OTHER): Payer: MEDICARE | Admitting: General Surgery

## 2011-11-10 ENCOUNTER — Encounter (INDEPENDENT_AMBULATORY_CARE_PROVIDER_SITE_OTHER): Payer: Self-pay | Admitting: General Surgery

## 2011-11-10 VITALS — BP 142/80 | HR 75 | Temp 97.0°F | Resp 20 | Ht 66.5 in | Wt 178.6 lb

## 2011-11-10 DIAGNOSIS — C4359 Malignant melanoma of other part of trunk: Secondary | ICD-10-CM

## 2011-11-10 NOTE — Patient Instructions (Signed)
Continue to check skin 

## 2011-11-14 ENCOUNTER — Encounter (INDEPENDENT_AMBULATORY_CARE_PROVIDER_SITE_OTHER): Payer: Self-pay | Admitting: General Surgery

## 2011-11-14 NOTE — Progress Notes (Signed)
Subjective:     Patient ID: [name], female   DOB: [birth year], 71 y.o.   MRN: [id number]  HPI The patient is a 71 year old white female who is about 8 months out from a wide excision of a melanoma on her back with negative lymph nodes. Since her last visit she has developed some shortness of breath. She is seeing Dr. Katrinka Blazing in cardiology who put her through a stress test which was negative. She also has a on her right upper arm that she is concerned about. She's had no bleeding or itching in the site.  Review of Systems  Constitutional: Positive for fatigue.  HENT: Negative.   Eyes: Negative.   Respiratory: Positive for shortness of breath.   Cardiovascular: Negative.   Gastrointestinal: Negative.   Genitourinary: Negative.   Musculoskeletal: Negative.   Skin: Negative.   Neurological: Negative.   Hematological: Negative.   Psychiatric/Behavioral: Negative.        Objective:   Physical Exam  Constitutional: She is oriented to person, place, and time. She appears well-developed and well-nourished.  HENT:  Head: Normocephalic and atraumatic.  Eyes: Conjunctivae and EOM are normal. Pupils are equal, round, and reactive to light.  Neck: Normal range of motion. Neck supple.  Cardiovascular: Normal rate, regular rhythm and normal heart sounds.   Pulmonary/Chest: Effort normal and breath sounds normal.       The scar on her back is healed nicely. There is no surrounding nodularity  Abdominal: Soft. Bowel sounds are normal.  Musculoskeletal: Normal range of motion.       No axillary adenopathy. The lesion on her right upper arm appears to be benign.  Neurological: She is alert and oriented to person, place, and time.  Skin: Skin is warm and dry.  Psychiatric: She has a normal mood and affect. Her behavior is normal.       Assessment:     8 months status post wide excision of a melanoma from the back. Clinically she has no evidence of further disease.    Plan:     At this  point she will continue to do regular skin checks. She will also continue to follow with her dermatologist. We will plan to see her back in about 6 months.

## 2011-11-18 ENCOUNTER — Ambulatory Visit (HOSPITAL_BASED_OUTPATIENT_CLINIC_OR_DEPARTMENT_OTHER): Payer: Medicare Other | Admitting: Lab

## 2011-11-18 DIAGNOSIS — C4359 Malignant melanoma of other part of trunk: Secondary | ICD-10-CM

## 2011-11-18 DIAGNOSIS — C439 Malignant melanoma of skin, unspecified: Secondary | ICD-10-CM

## 2011-11-18 LAB — COMPREHENSIVE METABOLIC PANEL
ALT: 14 U/L (ref 0–35)
AST: 16 U/L (ref 0–37)
Albumin: 4.3 g/dL (ref 3.5–5.2)
Alkaline Phosphatase: 62 U/L (ref 39–117)
BUN: 15 mg/dL (ref 6–23)
CO2: 25 mEq/L (ref 19–32)
Calcium: 9 mg/dL (ref 8.4–10.5)
Chloride: 106 mEq/L (ref 96–112)
Creatinine, Ser: 0.94 mg/dL (ref 0.50–1.10)
Glucose, Bld: 113 mg/dL — ABNORMAL HIGH (ref 70–99)
Potassium: 4 mEq/L (ref 3.5–5.3)
Sodium: 141 mEq/L (ref 135–145)
Total Bilirubin: 0.9 mg/dL (ref 0.3–1.2)
Total Protein: 6.3 g/dL (ref 6.0–8.3)

## 2011-11-18 LAB — CBC WITH DIFFERENTIAL/PLATELET
BASO%: 1.1 % (ref 0.0–2.0)
Basophils Absolute: 0 10*3/uL (ref 0.0–0.1)
EOS%: 3.5 % (ref 0.0–7.0)
Eosinophils Absolute: 0.1 10*3/uL (ref 0.0–0.5)
HCT: 42 % (ref 34.8–46.6)
HGB: 13.8 g/dL (ref 11.6–15.9)
LYMPH%: 32.7 % (ref 14.0–49.7)
MCH: 30.2 pg (ref 25.1–34.0)
MCHC: 32.9 g/dL (ref 31.5–36.0)
MCV: 92 fL (ref 79.5–101.0)
MONO#: 0.3 10*3/uL (ref 0.1–0.9)
MONO%: 8.5 % (ref 0.0–14.0)
NEUT#: 2.2 10*3/uL (ref 1.5–6.5)
NEUT%: 54.2 % (ref 38.4–76.8)
Platelets: 190 10*3/uL (ref 145–400)
RBC: 4.56 10*6/uL (ref 3.70–5.45)
RDW: 13.6 % (ref 11.2–14.5)
WBC: 4 10*3/uL (ref 3.9–10.3)
lymph#: 1.3 10*3/uL (ref 0.9–3.3)
nRBC: 0 % (ref 0–0)

## 2011-11-18 LAB — LACTATE DEHYDROGENASE: LDH: 172 U/L (ref 94–250)

## 2011-11-23 ENCOUNTER — Ambulatory Visit (HOSPITAL_BASED_OUTPATIENT_CLINIC_OR_DEPARTMENT_OTHER): Payer: Medicare Other | Admitting: Internal Medicine

## 2011-11-23 ENCOUNTER — Telehealth: Payer: Self-pay | Admitting: Internal Medicine

## 2011-11-23 ENCOUNTER — Other Ambulatory Visit: Payer: Medicare Other

## 2011-11-23 ENCOUNTER — Ambulatory Visit (HOSPITAL_COMMUNITY)
Admission: RE | Admit: 2011-11-23 | Discharge: 2011-11-23 | Disposition: A | Discharge status: Home / Self Care | Payer: Medicare Other | Source: Ambulatory Visit | Attending: Internal Medicine | Admitting: Internal Medicine

## 2011-11-23 VITALS — BP 125/75 | HR 73 | Temp 97.2°F | Ht 66.5 in | Wt 177.1 lb

## 2011-11-23 DIAGNOSIS — R079 Chest pain, unspecified: Secondary | ICD-10-CM | POA: Insufficient documentation

## 2011-11-23 DIAGNOSIS — C4359 Malignant melanoma of other part of trunk: Secondary | ICD-10-CM

## 2011-11-23 DIAGNOSIS — R0602 Shortness of breath: Secondary | ICD-10-CM | POA: Insufficient documentation

## 2011-11-23 DIAGNOSIS — Z85820 Personal history of malignant melanoma of skin: Secondary | ICD-10-CM | POA: Insufficient documentation

## 2011-11-23 NOTE — Progress Notes (Signed)
[facility] [phone]   [phone]  OFFICE PROGRESS NOTE  Lillia Mountain, MD, MD [address] [employer] [facility], [place]. [place] [address]  DIAGNOSIS: Stage IA malignant melanoma diagnosed in August of 2012  PRIOR THERAPY: Status post wide excision with sentinel lymph node biopsy from the left axilla under the care of Dr. Carolynne Edouard.  CURRENT THERAPY: Observation  INTERVAL HISTORY: [name] 71 y.o. female returns to the clinic today for six-month followup visit. The patient is doing fine today with no specific complaints. She denied having any significant weight loss or night sweats. She has no chest pain or shortness of breath. She was seen recently by Dr. Carolynne Edouard for questionable swelling in the left axilla but this was felt to be secondary to her recent surgery. The patient has repeat CBC, comprehensive metabolic panel and LDH performed recently and she is here today for evaluation and discussion of her lab results.  MEDICAL HISTORY: Past Medical History  Diagnosis Date  . Weight gain   . Fatigue   . Atypical moles   . Bronchitis   . IBD (inflammatory bowel disease)   . Dizziness   . Generalized headache   . Retina disorder     left  . Arthritis   . Anemia   . LLQ abdominal pain     ALLERGIES:  is allergic to epinephrine; penicillins; and valium.  MEDICATIONS:  Current Outpatient Prescriptions  Medication Sig Dispense Refill  . Ascorbic Acid (VITAMIN C WITH ROSE HIPS) 500 MG tablet Take 500 mg by mouth daily.        Marland Kitchen aspirin 81 MG tablet Take 81 mg by mouth daily.        . Calcium-Magnesium-Vitamin D (CALCIUM MAGNESIUM PO) Take by mouth 2 (two) times daily.        Marland Kitchen CINNAMON PO Take 1,000 mg by mouth daily.        . Coenzyme Q10 (COQ10 PO) Take by mouth daily.        . Cyanocobalamin (B-12) 2500 MCG TABS Take by mouth daily.        Marland Kitchen FA-BENFOTIAMINE-PABA-E-LIPOIC PO Take 150 mg by mouth daily.         . fluoruracil (CARAC) 0.5 % cream Apply 1 application topically daily.      . folic acid (FOLVITE) 400 MCG tablet Take 400 mcg by mouth daily.        Marland Kitchen ibuprofen (ADVIL,MOTRIN) 200 MG tablet Take 200 mg by mouth every 6 (six) hours as needed.        . meclizine (ANTIVERT) 25 MG tablet Ad lib.      . Omega-3 Fatty Acids (FISH OIL) 1200 MG CAPS Take by mouth daily.        . Omeprazole Magnesium 20.6 (20 BASE) MG CPDR Take by mouth daily.        Marland Kitchen PROAIR HFA 108 (90 BASE) MCG/ACT inhaler Ad lib.        SURGICAL HISTORY:  Past Surgical History  Procedure Date  . Shoulder surgery     right  . Knee arthroscopy   . Closed manipulation shoulder     rt  . Rotator cuff repair     left  . Tumor removal     ovary  . Total knee arthroplasty     right  . Melanoma excision with sentinel lymph node biopsy 03/18/11    Back, nodes neg    REVIEW  OF SYSTEMS:  A comprehensive review of systems was negative.   PHYSICAL EXAMINATION: General appearance: alert, cooperative and no distress Neck: no adenopathy Lymph nodes: Cervical, supraclavicular, and axillary nodes normal. Resp: clear to auscultation bilaterally Cardio: regular rate and rhythm, S1, S2 normal, no murmur, click, rub or gallop GI: soft, non-tender; bowel sounds normal; no masses,  no organomegaly Extremities: extremities normal, atraumatic, no cyanosis or edema  ECOG PERFORMANCE STATUS: 0 - Asymptomatic  Blood pressure 125/75, pulse 73, temperature 97.2 F (36.2 C), temperature source Oral, height 5' 6.5" (1.689 m), weight 177 lb 1.6 oz (80.332 kg).  LABORATORY DATA: Lab Results  Component Value Date   WBC 4.0 11/18/2011   HGB 13.8 11/18/2011   HCT 42.0 11/18/2011   MCV 92.0 11/18/2011   PLT 190 11/18/2011      Chemistry      Component Value Date/Time   NA 141 11/18/2011 1044   K 4.0 11/18/2011 1044   CL 106 11/18/2011 1044   CO2 25 11/18/2011 1044   BUN 15 11/18/2011 1044   CREATININE 0.94 11/18/2011 1044      Component Value  Date/Time   CALCIUM 9.0 11/18/2011 1044   ALKPHOS 62 11/18/2011 1044   AST 16 11/18/2011 1044   ALT 14 11/18/2011 1044   BILITOT 0.9 11/18/2011 1044       RADIOGRAPHIC STUDIES: Chest x-ray performed today showed no acute pulmonary abnormality.  ASSESSMENT:  This is a very pleasant 71 years old white female with stage IA malignant melanoma status post wide excision with sentinel lymph node biopsy. The patient is doing fine with no evidence for disease recurrence.  PLAN:  I discussed the lab result with the patient and recommended for her continuous observation for now. I would see her back for followup visit in 6 months with repeat CBC, comprehensive to bed and LDH.  All questions were answered. The patient knows to call the clinic with any problems, questions or concerns. We can certainly see the patient much sooner if necessary.

## 2011-11-23 NOTE — Telephone Encounter (Signed)
appts made and printed for pt aom °

## 2011-11-24 ENCOUNTER — Telehealth: Payer: Self-pay | Admitting: Medical Oncology

## 2011-11-24 NOTE — Telephone Encounter (Signed)
Requests results of chest  xray done yesterday. Left message to call in am.

## 2011-11-25 NOTE — Telephone Encounter (Signed)
Pt given result of CXR -voices understanding,.

## 2012-01-02 ENCOUNTER — Telehealth (INDEPENDENT_AMBULATORY_CARE_PROVIDER_SITE_OTHER): Payer: Self-pay | Admitting: General Surgery

## 2012-01-02 NOTE — Telephone Encounter (Signed)
LMOM informing pt that I had to reschedule her apt with Dr. Carolynne Edouard from 7/23 at 11:00 to 7/26 at 2:20.  I also sent a reminder card in the mail.

## 2012-02-07 ENCOUNTER — Encounter (INDEPENDENT_AMBULATORY_CARE_PROVIDER_SITE_OTHER): Payer: Self-pay | Admitting: General Surgery

## 2012-02-10 ENCOUNTER — Encounter (INDEPENDENT_AMBULATORY_CARE_PROVIDER_SITE_OTHER): Payer: Self-pay | Admitting: General Surgery

## 2012-02-14 ENCOUNTER — Encounter (INDEPENDENT_AMBULATORY_CARE_PROVIDER_SITE_OTHER): Payer: Self-pay | Admitting: General Surgery

## 2012-02-14 ENCOUNTER — Ambulatory Visit (INDEPENDENT_AMBULATORY_CARE_PROVIDER_SITE_OTHER): Payer: Medicare Other | Admitting: General Surgery

## 2012-02-14 VITALS — BP 140/76 | HR 75 | Temp 98.0°F | Ht 65.0 in | Wt 175.6 lb

## 2012-02-14 DIAGNOSIS — C4359 Malignant melanoma of other part of trunk: Secondary | ICD-10-CM

## 2012-02-14 NOTE — Progress Notes (Signed)
Subjective:     Patient ID: [name], female   DOB: 1941/02/26, 71 y.o.   MRN: [id number]  HPI The patient is a 71 year old white female who is about a year out from a wide excision of a melanoma from her back. Her sentinel nodes were negative. Since her last visit she has been reasonably well except for an episode of bronchitis. She recently met with her dermatologist who did not identify any suspicious lesions on her skin.  Review of Systems  Constitutional: Negative.   HENT: Negative.   Eyes: Negative.   Respiratory: Positive for cough.   Cardiovascular: Negative.   Gastrointestinal: Negative.   Genitourinary: Negative.   Musculoskeletal: Negative.   Skin: Negative.   Neurological: Negative.   Hematological: Negative.   Psychiatric/Behavioral: Negative.        Objective:   Physical Exam  Constitutional: She is oriented to person, place, and time. She appears well-developed and well-nourished.  HENT:  Head: Normocephalic and atraumatic.  Eyes: Conjunctivae and EOM are normal. Pupils are equal, round, and reactive to light.  Neck: Normal range of motion. Neck supple.  Cardiovascular: Normal rate, regular rhythm and normal heart sounds.   Pulmonary/Chest: Effort normal and breath sounds normal.       The scar on her back has healed well. She has no worrisome skin lesions on her back. There is no palpable axillary supraclavicular or cervical lymphadenopathy  Abdominal: Soft. Bowel sounds are normal.  Musculoskeletal: Normal range of motion.  Neurological: She is alert and oriented to person, place, and time.  Skin: Skin is warm and dry.  Psychiatric: She has a normal mood and affect. Her behavior is normal.       Assessment:     One year status post wide excision of the melanoma from the back    Plan:     At this point she needs to continue to follow with her dermatologist and oncologist. We will plan to see her back in about 6 months.

## 2012-03-26 ENCOUNTER — Telehealth (INDEPENDENT_AMBULATORY_CARE_PROVIDER_SITE_OTHER): Payer: Self-pay | Admitting: General Surgery

## 2012-03-26 ENCOUNTER — Telehealth (INDEPENDENT_AMBULATORY_CARE_PROVIDER_SITE_OTHER): Payer: Self-pay

## 2012-03-26 NOTE — Telephone Encounter (Signed)
Where is Shantella Pallas having pain and why would she need a biopsy and of what?

## 2012-03-26 NOTE — Telephone Encounter (Signed)
She is having pain at the site of the previous melanoma.  As far as the biopsy, I think the oncologist thought if there was a concern for recurrence she may need one.

## 2012-03-26 NOTE — Telephone Encounter (Signed)
The pt called one year s/p excision of melanoma on her back.  She has been having some pain and it woke her up last night.  She has been seeing the oncologist but they recommend she come here for a possible bx.  She has no visible skin changes or lumps.  Please call pt.

## 2012-03-26 NOTE — Telephone Encounter (Signed)
Spoke with [name] and let her know I made her an appt to see Dr. Carolynne Edouard on 9/17 at 10:40.  She was fine with this.  I also sent a reminder card in the mail.

## 2012-04-03 ENCOUNTER — Encounter (INDEPENDENT_AMBULATORY_CARE_PROVIDER_SITE_OTHER): Payer: Medicare Other | Admitting: General Surgery

## 2012-04-10 ENCOUNTER — Ambulatory Visit (INDEPENDENT_AMBULATORY_CARE_PROVIDER_SITE_OTHER): Payer: Medicare Other | Admitting: General Surgery

## 2012-04-10 ENCOUNTER — Encounter (INDEPENDENT_AMBULATORY_CARE_PROVIDER_SITE_OTHER): Payer: Medicare Other | Admitting: General Surgery

## 2012-04-10 VITALS — BP 138/88 | HR 71 | Temp 97.4°F | Resp 16 | Ht 66.0 in | Wt 178.2 lb

## 2012-04-10 DIAGNOSIS — C4359 Malignant melanoma of other part of trunk: Secondary | ICD-10-CM

## 2012-04-10 NOTE — Progress Notes (Signed)
Subjective:     Patient ID: [name], female   DOB: 1941/05/25, 71 y.o.   MRN: [id number]  HPI The patient is a 71 year old white female who is one year and 2 months out from a wide excision of a melanoma from her back. Her course was complicated by deep distance of the incision. Her main concern is that she continues to have some shooting pains around her scar. This does not occur every day. Other than this she has no complaints.  Review of Systems  Constitutional: Negative.   HENT: Negative.   Eyes: Negative.   Respiratory: Negative.   Cardiovascular: Negative.   Gastrointestinal: Negative.   Genitourinary: Negative.   Musculoskeletal: Positive for back pain.  Skin: Negative.   Neurological: Negative.   Hematological: Negative.   Psychiatric/Behavioral: Negative.        Objective:   Physical Exam  Constitutional: She is oriented to person, place, and time. She appears well-developed and well-nourished.  HENT:  Head: Normocephalic and atraumatic.  Eyes: Conjunctivae normal and EOM are normal. Pupils are equal, round, and reactive to light.  Neck: Normal range of motion. Neck supple.  Cardiovascular: Normal rate, regular rhythm and normal heart sounds.   Pulmonary/Chest: Effort normal and breath sounds normal.       The scar on her back has healed nicely. There is no palpable mass. There are no surrounding worrisome lesions of the skin. There is no palpable axillary supraclavicular or cervical lymphadenopathy  Abdominal: Soft. Bowel sounds are normal.  Musculoskeletal: Normal range of motion.  Lymphadenopathy:    She has no cervical adenopathy.  Neurological: She is alert and oriented to person, place, and time.  Skin: Skin is warm and dry.  Psychiatric: She has a normal mood and affect. Her behavior is normal.       Assessment:     1 year and 2 months status post wide excision of a melanoma from the back with some persistent soreness of her scar    Plan:     At  this point I would recommend getting a CT scan of her chest abdomen and pelvis to make sure there are no other sources for her discomfort. If the scans are negative then we will plan to see her back in about 3 months.

## 2012-04-10 NOTE — Patient Instructions (Signed)
Will call with results of CT. If neg then will see you back in January

## 2012-04-11 ENCOUNTER — Telehealth (INDEPENDENT_AMBULATORY_CARE_PROVIDER_SITE_OTHER): Payer: Self-pay

## 2012-04-11 NOTE — Telephone Encounter (Signed)
Pt would like to cancel CT at this time.  If Dr. Carolynne Edouard feels she really needs to reschedule would the office let her know.

## 2012-04-12 DIAGNOSIS — IMO0002 Reserved for concepts with insufficient information to code with codable children: Secondary | ICD-10-CM | POA: Insufficient documentation

## 2012-04-12 DIAGNOSIS — H2513 Age-related nuclear cataract, bilateral: Secondary | ICD-10-CM | POA: Insufficient documentation

## 2012-04-12 DIAGNOSIS — H35372 Puckering of macula, left eye: Secondary | ICD-10-CM | POA: Insufficient documentation

## 2012-04-12 NOTE — Telephone Encounter (Signed)
No problem.

## 2012-04-13 ENCOUNTER — Other Ambulatory Visit: Payer: Medicare Other

## 2012-04-17 ENCOUNTER — Telehealth: Payer: Self-pay | Admitting: Medical Oncology

## 2012-04-17 NOTE — Telephone Encounter (Signed)
Confirmed appts . 

## 2012-04-18 ENCOUNTER — Telehealth: Payer: Self-pay | Admitting: Internal Medicine

## 2012-04-18 NOTE — Telephone Encounter (Signed)
pt had called to r/s 11/12 to 11/13,done

## 2012-05-22 ENCOUNTER — Telehealth: Payer: Self-pay | Admitting: *Deleted

## 2012-05-22 ENCOUNTER — Other Ambulatory Visit (HOSPITAL_BASED_OUTPATIENT_CLINIC_OR_DEPARTMENT_OTHER): Payer: Medicare Other

## 2012-05-22 DIAGNOSIS — C4359 Malignant melanoma of other part of trunk: Secondary | ICD-10-CM

## 2012-05-22 LAB — CBC WITH DIFFERENTIAL/PLATELET
BASO%: 0.6 % (ref 0.0–2.0)
Basophils Absolute: 0 10*3/uL (ref 0.0–0.1)
EOS%: 3.2 % (ref 0.0–7.0)
Eosinophils Absolute: 0.2 10*3/uL (ref 0.0–0.5)
HCT: 40.6 % (ref 34.8–46.6)
HGB: 13.6 g/dL (ref 11.6–15.9)
LYMPH%: 30.7 % (ref 14.0–49.7)
MCH: 30.8 pg (ref 25.1–34.0)
MCHC: 33.5 g/dL (ref 31.5–36.0)
MCV: 91.9 fL (ref 79.5–101.0)
MONO#: 0.4 10*3/uL (ref 0.1–0.9)
MONO%: 7.6 % (ref 0.0–14.0)
NEUT#: 3 10*3/uL (ref 1.5–6.5)
NEUT%: 57.9 % (ref 38.4–76.8)
Platelets: 192 10*3/uL (ref 145–400)
RBC: 4.42 10*6/uL (ref 3.70–5.45)
RDW: 13 % (ref 11.2–14.5)
WBC: 5.2 10*3/uL (ref 3.9–10.3)
lymph#: 1.6 10*3/uL (ref 0.9–3.3)
nRBC: 0 % (ref 0–0)

## 2012-05-22 LAB — COMPREHENSIVE METABOLIC PANEL (CC13)
ALT: 19 U/L (ref 0–55)
AST: 15 U/L (ref 5–34)
Albumin: 4 g/dL (ref 3.5–5.0)
Alkaline Phosphatase: 78 U/L (ref 40–150)
BUN: 18 mg/dL (ref 7.0–26.0)
CO2: 22 mEq/L (ref 22–29)
Calcium: 9.5 mg/dL (ref 8.4–10.4)
Chloride: 113 mEq/L — ABNORMAL HIGH (ref 98–107)
Creatinine: 0.9 mg/dL (ref 0.6–1.1)
Glucose: 111 mg/dl — ABNORMAL HIGH (ref 70–99)
Potassium: 4.3 mEq/L (ref 3.5–5.1)
Sodium: 142 mEq/L (ref 136–145)
Total Bilirubin: 1.01 mg/dL (ref 0.20–1.20)
Total Protein: 6.6 g/dL (ref 6.4–8.3)

## 2012-05-22 LAB — LACTATE DEHYDROGENASE (CC13): LDH: 257 U/L — ABNORMAL HIGH (ref 125–220)

## 2012-05-22 NOTE — Telephone Encounter (Signed)
Dr [name] at [facility] is requesting a TSH and T4 be drawn with labwork that was done today, order was faxed and was given to pur lab technicians to review.  SLJ

## 2012-05-29 ENCOUNTER — Ambulatory Visit: Payer: Medicare Other | Admitting: Internal Medicine

## 2012-05-30 ENCOUNTER — Telehealth: Payer: Self-pay | Admitting: Internal Medicine

## 2012-05-30 ENCOUNTER — Ambulatory Visit (HOSPITAL_BASED_OUTPATIENT_CLINIC_OR_DEPARTMENT_OTHER): Payer: Medicare Other | Admitting: Internal Medicine

## 2012-05-30 VITALS — BP 148/81 | HR 66 | Temp 97.8°F | Resp 20 | Ht 66.0 in | Wt 179.2 lb

## 2012-05-30 DIAGNOSIS — C4359 Malignant melanoma of other part of trunk: Secondary | ICD-10-CM

## 2012-05-30 NOTE — Progress Notes (Signed)
[facility] [phone]   Fax:([phone]) [phone]  OFFICE PROGRESS NOTE  [name], MD [address] [place] [address] [place] [address]  DIAGNOSIS: Stage IA malignant melanoma diagnosed in August of 2012   PRIOR THERAPY: Status post wide excision with sentinel lymph node biopsy from the left axilla under the care of Dr. Carolynne Edouard.   CURRENT THERAPY: Observation  INTERVAL HISTORY: SHIRLENA BRINEGAR 71 y.o. female returns to the clinic today for routine six-month followup visit. The patient is feeling fine today with no specific complaints. She was seen by her dermatologist few months ago and 40 skin exam showed no evidence for recurrent melanoma or new lesions. She denied having any significant chest pain or shortness breath, no cough or hemoptysis. She has no weight loss or night sweats. The patient has repeat CBC, comprehensive metabolic panel and LDH performed recently and she is here today for evaluation and discussion of her lab results.  MEDICAL HISTORY: Past Medical History  Diagnosis Date  . Weight gain   . Fatigue   . Atypical moles   . Bronchitis   . IBD (inflammatory bowel disease)   . Dizziness   . Generalized headache   . Retina disorder     left  . Arthritis   . Anemia   . LLQ abdominal pain     ALLERGIES:  is allergic to epinephrine; penicillins; and valium.  MEDICATIONS:  Current Outpatient Prescriptions  Medication Sig Dispense Refill  . Ascorbic Acid (VITAMIN C WITH ROSE HIPS) 500 MG tablet Take 500 mg by mouth daily.        Marland Kitchen aspirin 81 MG tablet Take 81 mg by mouth daily.        . Calcium-Magnesium-Vitamin D (CALCIUM MAGNESIUM PO) Take by mouth 2 (two) times daily.        . Coenzyme Q10 (COQ10 PO) Take by mouth daily.        . Cyanocobalamin (B-12) 2500 MCG TABS Take by mouth daily.        . folic acid (FOLVITE) 400 MCG tablet Take 400 mcg by mouth daily.        Marland Kitchen gabapentin (NEURONTIN) 100 MG capsule Take 100 mg by mouth 3  (three) times daily.       Marland Kitchen ibuprofen (ADVIL,MOTRIN) 200 MG tablet Take 200 mg by mouth every 6 (six) hours as needed.        Marland Kitchen levothyroxine (SYNTHROID, LEVOTHROID) 25 MCG tablet Take 25 mcg by mouth daily.      . meclizine (ANTIVERT) 25 MG tablet Ad lib.      . Omega-3 Fatty Acids (FISH OIL) 1200 MG CAPS Take by mouth daily.        . Omeprazole Magnesium 20.6 (20 BASE) MG CPDR Take by mouth daily.        Marland Kitchen PROAIR HFA 108 (90 BASE) MCG/ACT inhaler Ad lib.        SURGICAL HISTORY:  Past Surgical History  Procedure Date  . Shoulder surgery     right  . Knee arthroscopy   . Closed manipulation shoulder     rt  . Rotator cuff repair     left  . Tumor removal     ovary  . Total knee arthroplasty     right  . Melanoma excision with sentinel lymph node biopsy 03/18/11    Back, nodes neg    REVIEW OF SYSTEMS:  A comprehensive review of systems was negative.   PHYSICAL  EXAMINATION: General appearance: alert, cooperative and no distress Head: Normocephalic, without obvious abnormality, atraumatic Neck: no adenopathy Lymph nodes: Cervical, supraclavicular, and axillary nodes normal. Resp: clear to auscultation bilaterally Cardio: regular rate and rhythm, S1, S2 normal, no murmur, click, rub or gallop GI: soft, non-tender; bowel sounds normal; no masses,  no organomegaly Extremities: extremities normal, atraumatic, no cyanosis or edema Skin exam showed no suspicious lesion.  ECOG PERFORMANCE STATUS: 0 - Asymptomatic  Blood pressure 148/81, pulse 66, temperature 97.8 F (36.6 C), temperature source Oral, resp. rate 20, height 5\' 6"  (1.676 m), weight 179 lb 3.2 oz (81.285 kg).  LABORATORY DATA: Lab Results  Component Value Date   WBC 5.2 05/22/2012   HGB 13.6 05/22/2012   HCT 40.6 05/22/2012   MCV 91.9 05/22/2012   PLT 192 05/22/2012      Chemistry      Component Value Date/Time   NA 142 05/22/2012 1004   NA 141 11/18/2011 1044   K 4.3 05/22/2012 1004   K 4.0 11/18/2011 1044   CL  113* 05/22/2012 1004   CL 106 11/18/2011 1044   CO2 22 05/22/2012 1004   CO2 25 11/18/2011 1044   BUN 18.0 05/22/2012 1004   BUN 15 11/18/2011 1044   CREATININE 0.9 05/22/2012 1004   CREATININE 0.94 11/18/2011 1044      Component Value Date/Time   CALCIUM 9.5 05/22/2012 1004   CALCIUM 9.0 11/18/2011 1044   ALKPHOS 78 05/22/2012 1004   ALKPHOS 62 11/18/2011 1044   AST 15 05/22/2012 1004   AST 16 11/18/2011 1044   ALT 19 05/22/2012 1004   ALT 14 11/18/2011 1044   BILITOT 1.01 05/22/2012 1004   BILITOT 0.9 11/18/2011 1044       RADIOGRAPHIC STUDIES: No results found.  ASSESSMENT: This is a very pleasant 71 years old white female with history of stage Ia malignant melanoma diagnosed in August of 2012 status post wide excision with sentinel lymph node biopsy from the left axilla. The patient has no evidence for disease recurrence.  PLAN: I discussed the lab result with the patient today. I recommended for her to continue on observation for now with repeat CBC, comprehensive metabolic panel, LDH as well as chest x-ray in 6 months She was advised to call me immediately if she has any concerning symptoms in the interval.  All questions were answered. The patient knows to call the clinic with any problems, questions or concerns. We can certainly see the patient much sooner if necessary.

## 2012-05-30 NOTE — Patient Instructions (Addendum)
Your labwork is unremarkable today. Followup in 6 months with repeat CBC, comprehensive metabolic panel, LDH and chest x-ray

## 2012-05-30 NOTE — Telephone Encounter (Signed)
gv pt appt schedule for may 2014 and referral for cxr to be done same day as lb 11/20/12.

## 2012-07-09 ENCOUNTER — Ambulatory Visit (INDEPENDENT_AMBULATORY_CARE_PROVIDER_SITE_OTHER): Payer: Medicare Other | Admitting: General Surgery

## 2012-07-25 ENCOUNTER — Ambulatory Visit (INDEPENDENT_AMBULATORY_CARE_PROVIDER_SITE_OTHER): Payer: Medicare Other | Admitting: General Surgery

## 2012-07-25 ENCOUNTER — Encounter (INDEPENDENT_AMBULATORY_CARE_PROVIDER_SITE_OTHER): Payer: Self-pay | Admitting: General Surgery

## 2012-07-25 VITALS — BP 140/86 | HR 65 | Temp 97.2°F | Ht 66.0 in | Wt 178.6 lb

## 2012-07-25 DIAGNOSIS — C4359 Malignant melanoma of other part of trunk: Secondary | ICD-10-CM

## 2012-07-25 NOTE — Patient Instructions (Signed)
Will get CT of chest

## 2012-07-25 NOTE — Progress Notes (Signed)
Subjective:     Patient ID: [name], female   DOB: December 29, 1940, 72 y.o.   MRN: [id number]  HPI The patient is a 72 year old white female who is 1-1/2 years status post wide excision of a melanoma from her back. Her course was complicated by a dehiscence of her incision. She continues to complain of an intermittent burning and stabbing pain in her back near the incision. Otherwise she has been well since her last visit.  Review of Systems  Constitutional: Negative.   HENT: Negative.   Eyes: Negative.   Respiratory: Negative.   Cardiovascular: Negative.   Gastrointestinal: Negative.   Genitourinary: Negative.   Musculoskeletal: Positive for back pain.  Skin: Negative.   Neurological: Negative.   Hematological: Negative.   Psychiatric/Behavioral: Negative.        Objective:   Physical Exam  Constitutional: She is oriented to person, place, and time. She appears well-developed and well-nourished.  HENT:  Head: Normocephalic and atraumatic.  Eyes: Conjunctivae normal and EOM are normal. Pupils are equal, round, and reactive to light.  Neck: Normal range of motion. Neck supple.  Cardiovascular: Normal rate, regular rhythm and normal heart sounds.   Pulmonary/Chest: Effort normal and breath sounds normal.       The scar on her back has healed nicely. There is no palpable mass on her back. There were no apparent skin lesions on her back. There is no palpable axillary or supraclavicular or cervical lymphadenopathy.  Abdominal: Soft. Bowel sounds are normal. She exhibits no mass. There is no tenderness.  Musculoskeletal: Normal range of motion.  Lymphadenopathy:    She has no cervical adenopathy.  Neurological: She is alert and oriented to person, place, and time.  Skin: Skin is warm and dry.  Psychiatric: She has a normal mood and affect. Her behavior is normal.       Assessment:     1-1/2 years status post wide excision of a melanoma on the back    Plan:     At this point  I would recommend a CT scan of her chest to make sure that there is no other source of her back pain. If this is negative then we will plan to see her in about 6 months.

## 2012-07-26 ENCOUNTER — Other Ambulatory Visit (INDEPENDENT_AMBULATORY_CARE_PROVIDER_SITE_OTHER): Payer: Self-pay

## 2012-07-26 ENCOUNTER — Telehealth (INDEPENDENT_AMBULATORY_CARE_PROVIDER_SITE_OTHER): Payer: Self-pay

## 2012-07-26 DIAGNOSIS — C4359 Malignant melanoma of other part of trunk: Secondary | ICD-10-CM

## 2012-07-26 NOTE — Telephone Encounter (Signed)
LMOM to call back for appt info. I scheduled her chest CT at [facility] location for 08/01/12 arrival time of 10:45. She will need CMET lab work done before CT. Order is in computer for her to go to [facility].

## 2012-07-26 NOTE — Telephone Encounter (Signed)
Pt given information on chest CT and labs.

## 2012-07-27 LAB — COMPREHENSIVE METABOLIC PANEL
ALT: 16 U/L (ref 0–35)
AST: 16 U/L (ref 0–37)
Albumin: 4.6 g/dL (ref 3.5–5.2)
Alkaline Phosphatase: 61 U/L (ref 39–117)
BUN: 20 mg/dL (ref 6–23)
CO2: 25 mEq/L (ref 19–32)
Calcium: 9.3 mg/dL (ref 8.4–10.5)
Chloride: 104 mEq/L (ref 96–112)
Creat: 0.79 mg/dL (ref 0.50–1.10)
Glucose, Bld: 81 mg/dL (ref 70–99)
Potassium: 4 mEq/L (ref 3.5–5.3)
Sodium: 140 mEq/L (ref 135–145)
Total Bilirubin: 0.8 mg/dL (ref 0.3–1.2)
Total Protein: 6.5 g/dL (ref 6.0–8.3)

## 2012-08-01 ENCOUNTER — Ambulatory Visit
Admission: RE | Admit: 2012-08-01 | Discharge: 2012-08-01 | Disposition: A | Discharge status: Home / Self Care | Payer: Medicare Other | Source: Ambulatory Visit | Attending: General Surgery | Admitting: General Surgery

## 2012-08-01 DIAGNOSIS — C4359 Malignant melanoma of other part of trunk: Secondary | ICD-10-CM

## 2012-08-01 MED ORDER — IOHEXOL 300 MG/ML  SOLN
75.0000 mL | Freq: Once | INTRAMUSCULAR | Status: AC | PRN
  Administered 2012-08-01: 75 mL via INTRAVENOUS

## 2012-08-06 ENCOUNTER — Telehealth (INDEPENDENT_AMBULATORY_CARE_PROVIDER_SITE_OTHER): Payer: Self-pay

## 2012-08-06 NOTE — Telephone Encounter (Signed)
LMOM to return my call for CT results

## 2012-08-08 NOTE — Telephone Encounter (Signed)
Patient made aware CT did not show metastasis in lungs or source of back pain. She will call with any other questions.

## 2012-08-14 ENCOUNTER — Encounter (INDEPENDENT_AMBULATORY_CARE_PROVIDER_SITE_OTHER): Payer: Medicare Other | Admitting: General Surgery

## 2012-09-27 ENCOUNTER — Other Ambulatory Visit: Payer: Self-pay | Admitting: Gynecology

## 2012-10-04 ENCOUNTER — Other Ambulatory Visit: Payer: Self-pay | Admitting: Gastroenterology

## 2012-10-23 ENCOUNTER — Encounter (HOSPITAL_COMMUNITY): Payer: Self-pay | Admitting: *Deleted

## 2012-10-25 ENCOUNTER — Ambulatory Visit (HOSPITAL_COMMUNITY)
Admission: RE | Admit: 2012-10-25 | Discharge: 2012-10-25 | Disposition: A | Discharge status: Home / Self Care | Payer: Medicare Other | Source: Ambulatory Visit | Attending: Geriatric Medicine | Admitting: Geriatric Medicine

## 2012-10-25 ENCOUNTER — Other Ambulatory Visit: Payer: Self-pay | Admitting: Geriatric Medicine

## 2012-10-25 ENCOUNTER — Other Ambulatory Visit (HOSPITAL_COMMUNITY): Payer: Self-pay | Admitting: Geriatric Medicine

## 2012-10-25 ENCOUNTER — Ambulatory Visit
Admission: RE | Admit: 2012-10-25 | Discharge: 2012-10-25 | Disposition: A | Discharge status: Home / Self Care | Payer: Medicare Other | Source: Ambulatory Visit | Attending: Geriatric Medicine | Admitting: Geriatric Medicine

## 2012-10-25 DIAGNOSIS — M7989 Other specified soft tissue disorders: Secondary | ICD-10-CM

## 2012-10-25 DIAGNOSIS — M25569 Pain in unspecified knee: Secondary | ICD-10-CM

## 2012-10-25 DIAGNOSIS — M79609 Pain in unspecified limb: Secondary | ICD-10-CM

## 2012-10-25 DIAGNOSIS — R0789 Other chest pain: Secondary | ICD-10-CM

## 2012-10-25 MED ORDER — IOHEXOL 350 MG/ML SOLN
125.0000 mL | Freq: Once | INTRAVENOUS | Status: AC | PRN
  Administered 2012-10-25: 125 mL via INTRAVENOUS

## 2012-10-25 NOTE — Progress Notes (Signed)
*  PRELIMINARY RESULTS* Vascular Ultrasound Left lower extremity venous duplex has been completed.  Preliminary findings: Left:  No evidence of DVT, superficial thrombosis, or Baker's cyst.   Farrel Demark, RDMS, RVT  10/25/2012, 4:10 PM

## 2012-10-26 ENCOUNTER — Encounter (HOSPITAL_COMMUNITY): Payer: Self-pay | Admitting: *Deleted

## 2012-11-07 ENCOUNTER — Encounter (HOSPITAL_COMMUNITY): Payer: Self-pay | Admitting: Pharmacy Technician

## 2012-11-12 ENCOUNTER — Telehealth: Payer: Self-pay | Admitting: Internal Medicine

## 2012-11-12 NOTE — Telephone Encounter (Signed)
pt called to r/s appt due to having colonoscopy the same day....done

## 2012-11-20 ENCOUNTER — Encounter (HOSPITAL_COMMUNITY): Payer: Self-pay | Admitting: *Deleted

## 2012-11-20 ENCOUNTER — Encounter (HOSPITAL_COMMUNITY)
Admission: RE | Disposition: A | Discharge status: Home / Self Care | Payer: Self-pay | Source: Ambulatory Visit | Attending: Emergency Medicine

## 2012-11-20 ENCOUNTER — Ambulatory Visit (HOSPITAL_COMMUNITY): Payer: Medicare Other | Admitting: Anesthesiology

## 2012-11-20 ENCOUNTER — Other Ambulatory Visit: Payer: Medicare Other

## 2012-11-20 ENCOUNTER — Emergency Department (HOSPITAL_COMMUNITY)
Admission: RE | Admit: 2012-11-20 | Discharge: 2012-11-20 | Disposition: A | Discharge status: Home / Self Care | Payer: Medicare Other | Source: Ambulatory Visit | Attending: Emergency Medicine | Admitting: Emergency Medicine

## 2012-11-20 ENCOUNTER — Emergency Department (HOSPITAL_COMMUNITY): Payer: Medicare Other

## 2012-11-20 ENCOUNTER — Ambulatory Visit (HOSPITAL_COMMUNITY)
Admission: RE | Admit: 2012-11-20 | Discharge: 2012-11-20 | Disposition: A | Discharge status: Home / Self Care | Payer: Medicare Other | Source: Ambulatory Visit | Attending: Gastroenterology | Admitting: Gastroenterology

## 2012-11-20 ENCOUNTER — Encounter (HOSPITAL_COMMUNITY): Payer: Self-pay | Admitting: Anesthesiology

## 2012-11-20 DIAGNOSIS — E039 Hypothyroidism, unspecified: Secondary | ICD-10-CM | POA: Insufficient documentation

## 2012-11-20 DIAGNOSIS — R1084 Generalized abdominal pain: Secondary | ICD-10-CM | POA: Insufficient documentation

## 2012-11-20 DIAGNOSIS — F411 Generalized anxiety disorder: Secondary | ICD-10-CM | POA: Insufficient documentation

## 2012-11-20 DIAGNOSIS — Z8709 Personal history of other diseases of the respiratory system: Secondary | ICD-10-CM | POA: Insufficient documentation

## 2012-11-20 DIAGNOSIS — Z862 Personal history of diseases of the blood and blood-forming organs and certain disorders involving the immune mechanism: Secondary | ICD-10-CM | POA: Insufficient documentation

## 2012-11-20 DIAGNOSIS — K219 Gastro-esophageal reflux disease without esophagitis: Secondary | ICD-10-CM | POA: Insufficient documentation

## 2012-11-20 DIAGNOSIS — Z7982 Long term (current) use of aspirin: Secondary | ICD-10-CM | POA: Insufficient documentation

## 2012-11-20 DIAGNOSIS — Z8719 Personal history of other diseases of the digestive system: Secondary | ICD-10-CM | POA: Insufficient documentation

## 2012-11-20 DIAGNOSIS — Z8739 Personal history of other diseases of the musculoskeletal system and connective tissue: Secondary | ICD-10-CM | POA: Insufficient documentation

## 2012-11-20 DIAGNOSIS — R109 Unspecified abdominal pain: Secondary | ICD-10-CM

## 2012-11-20 DIAGNOSIS — G8918 Other acute postprocedural pain: Secondary | ICD-10-CM | POA: Insufficient documentation

## 2012-11-20 DIAGNOSIS — Z79899 Other long term (current) drug therapy: Secondary | ICD-10-CM | POA: Insufficient documentation

## 2012-11-20 DIAGNOSIS — Z8659 Personal history of other mental and behavioral disorders: Secondary | ICD-10-CM | POA: Insufficient documentation

## 2012-11-20 HISTORY — DX: Hypothyroidism, unspecified: E03.9

## 2012-11-20 HISTORY — DX: Anxiety disorder, unspecified: F41.9

## 2012-11-20 HISTORY — DX: Depression, unspecified: F32.A

## 2012-11-20 HISTORY — DX: Gastro-esophageal reflux disease without esophagitis: K21.9

## 2012-11-20 HISTORY — DX: Adverse effect of unspecified anesthetic, initial encounter: T41.45XA

## 2012-11-20 HISTORY — DX: Insomnia, unspecified: G47.00

## 2012-11-20 HISTORY — PX: COLONOSCOPY WITH PROPOFOL: SHX5780

## 2012-11-20 HISTORY — DX: Other complications of anesthesia, initial encounter: T88.59XA

## 2012-11-20 HISTORY — DX: Major depressive disorder, single episode, unspecified: F32.9

## 2012-11-20 HISTORY — DX: Personal history of antineoplastic chemotherapy: Z92.21

## 2012-11-20 LAB — COMPREHENSIVE METABOLIC PANEL
ALT: 12 U/L (ref 0–35)
AST: 15 U/L (ref 0–37)
Albumin: 3.9 g/dL (ref 3.5–5.2)
Alkaline Phosphatase: 72 U/L (ref 39–117)
BUN: 11 mg/dL (ref 6–23)
CO2: 28 mEq/L (ref 19–32)
Calcium: 9.2 mg/dL (ref 8.4–10.5)
Chloride: 102 mEq/L (ref 96–112)
Creatinine, Ser: 0.75 mg/dL (ref 0.50–1.10)
GFR calc Af Amer: >90 mL/min (ref >90)
GFR calc non Af Amer: 83 mL/min — ABNORMAL LOW (ref >90)
Glucose, Bld: 123 mg/dL — ABNORMAL HIGH (ref 70–99)
Potassium: 3.4 mEq/L — ABNORMAL LOW (ref 3.5–5.1)
Sodium: 141 mEq/L (ref 135–145)
Total Bilirubin: 1.2 mg/dL (ref 0.3–1.2)
Total Protein: 6.7 g/dL (ref 6.0–8.3)

## 2012-11-20 LAB — URINALYSIS, ROUTINE W REFLEX MICROSCOPIC
Bilirubin Urine: NEGATIVE
Glucose, UA: NEGATIVE mg/dL
Hgb urine dipstick: NEGATIVE
Ketones, ur: NEGATIVE mg/dL
Leukocytes, UA: NEGATIVE
Nitrite: NEGATIVE
Protein, ur: NEGATIVE mg/dL
Specific Gravity, Urine: 1.005 (ref 1.005–1.030)
Urobilinogen, UA: 0.2 mg/dL (ref 0.0–1.0)
pH: 8 (ref 5.0–8.0)

## 2012-11-20 LAB — CBC WITH DIFFERENTIAL/PLATELET
Basophils Absolute: 0 10*3/uL (ref 0.0–0.1)
Basophils Relative: 0 % (ref 0–1)
Eosinophils Absolute: 0 10*3/uL (ref 0.0–0.7)
Eosinophils Relative: 0 % (ref 0–5)
HCT: 39.7 % (ref 36.0–46.0)
Hemoglobin: 13.6 g/dL (ref 12.0–15.0)
Lymphocytes Relative: 13 % (ref 12–46)
Lymphs Abs: 1.2 10*3/uL (ref 0.7–4.0)
MCH: 30.4 pg (ref 26.0–34.0)
MCHC: 34.3 g/dL (ref 30.0–36.0)
MCV: 88.6 fL (ref 78.0–100.0)
Monocytes Absolute: 0.5 10*3/uL (ref 0.1–1.0)
Monocytes Relative: 5 % (ref 3–12)
Neutro Abs: 7.5 10*3/uL (ref 1.7–7.7)
Neutrophils Relative %: 81 % — ABNORMAL HIGH (ref 43–77)
Platelets: 212 10*3/uL (ref 150–400)
RBC: 4.48 MIL/uL (ref 3.87–5.11)
RDW: 12.6 % (ref 11.5–15.5)
WBC: 9.3 10*3/uL (ref 4.0–10.5)

## 2012-11-20 LAB — LIPASE, BLOOD: Lipase: 19 U/L (ref 11–59)

## 2012-11-20 LAB — CG4 I-STAT (LACTIC ACID): Lactic Acid, Venous: 1.77 mmol/L (ref 0.5–2.2)

## 2012-11-20 SURGERY — COLONOSCOPY WITH PROPOFOL
Anesthesia: Monitor Anesthesia Care

## 2012-11-20 MED ORDER — PROPOFOL 10 MG/ML IV EMUL
INTRAVENOUS | Status: DC | PRN
  Administered 2012-11-20: 75 ug/kg/min via INTRAVENOUS

## 2012-11-20 MED ORDER — FENTANYL CITRATE 0.05 MG/ML IJ SOLN
50.0000 ug | Freq: Once | INTRAMUSCULAR | Status: AC
  Administered 2012-11-20: 50 ug via INTRAVENOUS
  Filled 2012-11-20: qty 2

## 2012-11-20 MED ORDER — GLYCOPYRROLATE 0.2 MG/ML IJ SOLN
INTRAMUSCULAR | Status: DC | PRN
  Administered 2012-11-20: 0.2 mg via INTRAVENOUS

## 2012-11-20 MED ORDER — FENTANYL CITRATE 0.05 MG/ML IJ SOLN
INTRAMUSCULAR | Status: DC | PRN
  Administered 2012-11-20 (×4): 25 ug via INTRAVENOUS

## 2012-11-20 MED ORDER — SODIUM CHLORIDE 0.9 % IV SOLN: INTRAVENOUS | Status: DC

## 2012-11-20 MED ORDER — LACTATED RINGERS IV SOLN
INTRAVENOUS | Status: DC
  Administered 2012-11-20: 10:00:00 via INTRAVENOUS

## 2012-11-20 MED ORDER — SODIUM CHLORIDE 0.9 % IV BOLUS (SEPSIS)
1000.0000 mL | Freq: Once | INTRAVENOUS | Status: AC
  Administered 2012-11-20: 1000 mL via INTRAVENOUS

## 2012-11-20 MED ORDER — PROPOFOL 10 MG/ML IV BOLUS
INTRAVENOUS | Status: DC | PRN
  Administered 2012-11-20 (×2): 20 mg via INTRAVENOUS

## 2012-11-20 SURGICAL SUPPLY — 22 items

## 2012-11-20 NOTE — H&P (Signed)
Procedure: Screening colonoscopy.  History: The patient is a 72 year old female born 1941-06-28. The patient's sister was diagnosed with colon polyps under the age of 76. The patient underwent  normal screening colonoscopies in 2001 and 2006.  The patient is scheduled to undergo a repeat screening colonoscopy today.  Chronic medications: Nexium. Levothyroxin. Calcium. Vitamin D. Fish oil. Vitamin C. Aspirin. Meclizine. ProAir HFA. Ibuprofen. Folic acid. Vitamin B12. Gabapentin. Citalopram.  Past medical and surgical history: Memory loss. Peripheral neuropathy. Migraine headache syndrome. Depression. Degenerative joint disease. Chronic insomnia. Irritable bowel syndrome. Colonic diverticulosis. Lumbar spondylolisthesis. Rotator cuff surgery. Bilateral tubal ligation. Breast augmentation surgery. Bilateral salpingo-oophorectomy. Melanoma skin cancers.  Medication allergies: Penicillin  Habits: The patient has never smoked cigarettes and consumes alcohol in moderation  Exam: The patient is alert and lying comfortably on the endoscopy stretcher. Cardiac exam reveals a regular rhythm. Abdomen is soft, flat, and nontender to palpation in all quadrants. Lungs are clear to auscultation.  Plan: Proceed with scheduled screening colonoscopy using propofol sedation.

## 2012-11-20 NOTE — Transfer of Care (Signed)
Immediate Anesthesia Transfer of Care Note  Patient: [name]  Procedure(s) Performed: Procedure(s) (LRB): COLONOSCOPY WITH PROPOFOL (N/A)  Patient Location: PACU  Anesthesia Type: MAC  Level of Consciousness: sedated, patient cooperative and responds to stimulaton  Airway & Oxygen Therapy: Patient Spontanous Breathing and Patient connected to face mask oxgen  Post-op Assessment: Report given to PACU RN and Post -op Vital signs reviewed and stable  Post vital signs: Reviewed and stable  Complications: No apparent anesthesia complications

## 2012-11-20 NOTE — ED Notes (Signed)
[id number]<AV> Expected date:<BR> Expected time:<BR> Means of arrival:<BR> Comments:<BR> Henderson PT

## 2012-11-20 NOTE — Op Note (Signed)
Procedure: Screening colonoscopy  Endoscopist: Danise Edge  Premedication: Propofol administered by anesthesia  Procedure: The patient was placed in the left lateral decubitus position. Anal inspection and digital rectal exam were normal. The Pentax pediatric colonoscope was introduced into the rectum and advanced to the cecum as identified by a normal-appearing ileocecal valve and appendiceal orifice. Performance of the colonoscopy today was technically extremely difficult due to colonic loop formation.  Rectum. Normal. Retroflex view of the distal rectum normal.  Sigmoid colon and descending colon. Extensive left colonic diverticulosis.  Splenic flexure. Normal.  Transverse colon. Normal.  Hepatic flexure. Normal.  Ascending colon. Normal.  Cecum and ileocecal valve. Normal.  Assessment: Normal screening proctocolonoscopy to the cecum. Performance of the colonoscopy today was extremely difficult due to colonic loop formation. The patient underwent a normal screening colonoscopies in 2003, 2009, and 2014.

## 2012-11-20 NOTE — Anesthesia Preprocedure Evaluation (Addendum)
Anesthesia Evaluation  Patient identified by MRN, date of birth, ID band Patient awake    Reviewed: Allergy & Precautions, H&P , NPO status , Patient's Chart, lab work & pertinent test results  Airway       Dental  (+) Dental Advisory Given   Pulmonary neg pulmonary ROS,          Cardiovascular negative cardio ROS      Neuro/Psych  Headaches, negative psych ROS   GI/Hepatic negative GI ROS, Neg liver ROS,   Endo/Other  Hypothyroidism   Renal/GU negative Renal ROS     Musculoskeletal negative musculoskeletal ROS (+)   Abdominal   Peds  Hematology negative hematology ROS (+)   Anesthesia Other Findings   Reproductive/Obstetrics negative OB ROS                           Anesthesia Physical Anesthesia Plan  ASA: II  Anesthesia Plan: MAC   Post-op Pain Management:    Induction: Intravenous  Airway Management Planned: Simple Face Mask  Additional Equipment:   Intra-op Plan:   Post-operative Plan:   Informed Consent: I have reviewed the patients History and Physical, chart, labs and discussed the procedure including the risks, benefits and alternatives for the proposed anesthesia with the patient or authorized representative who has indicated his/her understanding and acceptance.   Dental advisory given  Plan Discussed with: CRNA  Anesthesia Plan Comments:         Anesthesia Quick Evaluation

## 2012-11-20 NOTE — ED Notes (Signed)
Pt alert and oriented x4. Respirations even and unlabored, bilateral symmetrical rise and fall of chest. Skin warm and dry. In no acute distress. Denies needs.   

## 2012-11-20 NOTE — Anesthesia Postprocedure Evaluation (Signed)
Anesthesia Post Note  Patient: [name]  Procedure(s) Performed: Procedure(s) (LRB): COLONOSCOPY WITH PROPOFOL (N/A)  Anesthesia type: MAC  Patient location: PACU  Post pain: Pain level controlled  Post assessment: Post-op Vital signs reviewed  Last Vitals: BP 151/89  Temp(Src) 36.6 C (Oral)  Resp 12  Ht 5' 6.5" (1.689 m)  Wt 175 lb (79.379 kg)  BMI 27.83 kg/m2  SpO2 97%  Post vital signs: Reviewed  Level of consciousness: awake  Complications: No apparent anesthesia complications

## 2012-11-20 NOTE — ED Provider Notes (Signed)
History     CSN: [id number]  Arrival date & time 11/20/12  1644   First MD Initiated Contact with Patient 11/20/12 1648      Chief Complaint  Patient presents with  . pain after colonoscopy this am     (Consider location/radiation/quality/duration/timing/severity/associated sxs/prior treatment) HPI Patient presents with abdominal pain. She had any regularly scheduled colonoscopy approximately 5 hours prior to ED arrival.  Following the colonoscopy she noticed persistent diffuse abdominal pain, and an inability to have flatus. Following the procedure the patient had acute abdomen x-ray, which did not show pneumoperitoneum, but did show colonic distention. She was then sent here for evaluation. Since onset of symptoms, there have been no clear alleviating or exacerbating factors. She denies emesis, does endorse anorexia. She denies fever, chest pain, dyspnea.  Past Medical History  Diagnosis Date  . Weight gain   . Fatigue   . Atypical moles   . Bronchitis   . IBD (inflammatory bowel disease)   . Dizziness   . Retina disorder     left  . LLQ abdominal pain   . Hypothyroidism   . Generalized headache     migraines  . Arthritis     all over  . Anemia     3 years ago  . Complication of anesthesia     shoulder surgery- not all the way asleep  . Insomnia   . Anxiety     in past  . Depression     in past  . GERD (gastroesophageal reflux disease)   . Cancer     x2 stg II melanoma  . History of chemotherapy     topical cream for h/o skin CA    Past Surgical History  Procedure Laterality Date  . Shoulder surgery      right  . Knee arthroscopy    . Closed manipulation shoulder      rt  . Rotator cuff repair      left  . Tumor removal      ovary  . Total knee arthroplasty      right  . Melanoma excision with sentinel lymph node biopsy  03/18/11    Back, nodes neg  . Joint replacement      RTK  . Left knee miniscus tear repair    . Colonscopy      May 2014     Family History  Problem Relation Age of Onset  . Hypertension Mother   . Stroke Mother   . Heart disease Father   . Diabetes Father   . Hypertension Father   . Hyperlipidemia Father   . Diabetes Sister   . Heart disease Sister     History  Substance Use Topics  . Smoking status: Never Smoker   . Smokeless tobacco: Never Used  . Alcohol Use: Yes     Comment: once a week    OB History   Grav Para Term Preterm Abortions TAB SAB Ect Mult Living                  Review of Systems  Constitutional:       Per HPI, otherwise negative  HENT:       Per HPI, otherwise negative  Respiratory:       Per HPI, otherwise negative  Cardiovascular:       Per HPI, otherwise negative  Gastrointestinal: Positive for nausea, abdominal pain and abdominal distention. Negative for vomiting, diarrhea, constipation, blood in stool, anal bleeding and  rectal pain.  Endocrine:       Negative aside from HPI  Genitourinary:       Neg aside from HPI   Musculoskeletal:       Per HPI, otherwise negative  Skin: Negative.   Neurological: Negative for syncope.    Allergies  Epinephrine; Penicillins; and Valium  Home Medications   Current Outpatient Rx  Name  Route  Sig  Dispense  Refill  . diphenhydrAMINE (BENADRYL) 50 MG capsule   Oral   Take 50 mg by mouth at bedtime as needed for sleep.         Marland Kitchen esomeprazole (NEXIUM) 40 MG capsule   Oral   Take 40 mg by mouth daily before breakfast.         . gabapentin (NEURONTIN) 100 MG capsule   Oral   Take 100 mg by mouth 3 (three) times daily.          Marland Kitchen levothyroxine (SYNTHROID, LEVOTHROID) 25 MCG tablet   Oral   Take 25 mcg by mouth daily before breakfast.          . Melatonin 3 MG TABS   Oral   Take 2 tablets by mouth at bedtime.         . Ascorbic Acid (VITAMIN C) 1000 MG tablet   Oral   Take 1,000 mg by mouth daily.         Marland Kitchen aspirin 81 MG tablet   Oral   Take 81 mg by mouth daily.           .  Calcium-Magnesium-Vitamin D (CALCIUM MAGNESIUM PO)   Oral   Take 1 tablet by mouth 2 (two) times daily.          . Cholecalciferol (VITAMIN D) 2000 UNITS CAPS   Oral   Take 2 capsules by mouth daily.         . Coenzyme Q10 (COQ10 PO)   Oral   Take 200 mg by mouth daily.          . Cyanocobalamin (VITAMIN B 12 PO)   Oral   Take 3,000 mcg by mouth daily.         . fish oil-omega-3 fatty acids 1000 MG capsule   Oral   Take 1 g by mouth daily.         . folic acid (FOLVITE) 800 MCG tablet   Oral   Take 800 mcg by mouth daily.         Marland Kitchen ibuprofen (ADVIL,MOTRIN) 200 MG tablet   Oral   Take 200 mg by mouth every 6 (six) hours as needed for pain.            BP 144/75  Pulse 73  Temp(Src) 98.4 F (36.9 C) (Oral)  Resp 18  Ht 5' 6.5" (1.689 m)  Wt 175 lb (79.379 kg)  BMI 27.83 kg/m2  SpO2 96%  Physical Exam  Nursing note and vitals reviewed. Constitutional: She is oriented to person, place, and time. She appears well-developed and well-nourished. No distress.  HENT:  Head: Normocephalic and atraumatic.  Eyes: Conjunctivae and EOM are normal.  Cardiovascular: Normal rate and regular rhythm.   Pulmonary/Chest: Effort normal and breath sounds normal. No stridor. No respiratory distress.  Abdominal: She exhibits no distension. There is no hepatosplenomegaly or hepatomegaly. There is generalized tenderness. There is guarding. There is no rigidity, no rebound and no CVA tenderness.  No peritoneal finding  Musculoskeletal: She exhibits no edema.  Neurological: She is  alert and oriented to person, place, and time. No cranial nerve deficit.  Skin: Skin is warm and dry.  Psychiatric: She has a normal mood and affect.    ED Course  Procedures (including critical care time)  Labs Reviewed  CBC WITH DIFFERENTIAL - Abnormal; Notable for the following:    Neutrophils Relative 81 (*)    All other components within normal limits  COMPREHENSIVE METABOLIC PANEL -  Abnormal; Notable for the following:    Potassium 3.4 (*)    Glucose, Bld 123 (*)    GFR calc non Af Amer 83 (*)    All other components within normal limits  LIPASE, BLOOD  URINALYSIS, ROUTINE W REFLEX MICROSCOPIC  CG4 I-STAT (LACTIC ACID)   Dg Abd Acute W/chest  11/20/2012  *RADIOLOGY REPORT*  Clinical Data: Abdominal pain, nausea and diarrhea.  Status post colonoscopy earlier today.  ACUTE ABDOMEN SERIES (ABDOMEN 2 VIEW & CHEST 1 VIEW)  Comparison: Previous examinations.  Findings: Normal sized heart.  Clear lungs. Multiple mildly to moderately dilated small bowel loops containing a small number of air fluid levels.  Calcified gallstones in the right upper abdomen. Mild scoliosis.  Right shoulder fixation anchor.  No free peritoneal air.  IMPRESSION:  1.  Dilated, gas-filled small bowel loops containing some air fluid levels.  This is most likely due to reflux of air into the small bowel during the patient's colonoscopy.  A small bowel obstruction can also have this appearance. 2.  Cholelithiasis.   Original Report Authenticated By: Beckie Salts, M.D.    Dg Abd Portable 1v  11/20/2012  *RADIOLOGY REPORT*  Clinical Data: Pain post colonoscopy.  PORTABLE ABDOMEN - 1 VIEW  Comparison: Earlier films of the same day  Findings: Multiple partially calcified stones in the gallbladder. There is gaseous distention of the colon. Interval decrease in degree of small bowel distention with a few scattered fluid levels in the mid abdomen.  No free air.  IMPRESSION: 1.  No free air. 2.  Cholelithiasis.   Original Report Authenticated By: D. Andria Rhein, MD    I interpreted the x-ray, unremarkable in terms of pneumoperitoneum, but there is colonic distention.  No diagnosis found.   6:28 PM On repeat exam the patient is substantially better.  She is now passing flatus, and ambulatory. Upright abdomen did not show pneumoperitoneum, and demonstrated decreasing distention throughout the colon.  7:34 PM Patient  continues to improve substantially.  No new complaints. Labs reviewed with her.  Return precautions, and on restrictions also discussed. I encouraged her to have a colleague with her tonight, though her clinical improvement is very reassuring.  MDM  This patient presents with acute abdominal pain following colonoscopy.  During the patient's emergency room course she improved substantially, began to pass flatus, have significantly decreased pain.  She remained afebrile, and her labs were reassuring. Given her description of significant pain, there is suspicion for ileus versus inflammation, absent any distress, fever, low suspicion for infection.  The improvement on serial x-rays his reassuring for the low suspicion of pneumoperitoneum, occult. The patient was discharged in stable condition after lengthy discussion on return precautions, and the need to followup        Gerhard Munch, MD 11/20/12 [id number]

## 2012-11-20 NOTE — Progress Notes (Signed)
The patient underwent a normal screening colonoscopy using propofol sedation. Performance of the colonoscopy was technically very difficult due to colonic loop formation. The patient expressed discomfort in the lower left aspect of her abdomen post procedure. Her abdomen was soft and flat by examination. There are no signs of peritoneal inflammation by exam.  The patient was sent to the radiology suite for an acute abdominal x-ray series which did not show free peritoneal air.

## 2012-11-20 NOTE — ED Notes (Signed)
Pt has colonoscopy this am, d/c home at 1300, having abdominal pain 4/10, has not been able to pass gas or burp. Pt reports colonscopy was very difficult and xrays were taken to make sure md did not perf colon.

## 2012-11-20 NOTE — ED Notes (Signed)
md at bedside  Pt reporting she is passing gas

## 2012-11-20 NOTE — ED Notes (Signed)
Pt reports had colonoscopy this am, d/c home around 1300, pain since d/c. Unable to  Pass gas or burp. Pain 4/10 at present. Bowel sounds present.

## 2012-11-20 NOTE — ED Notes (Signed)
md at bedside

## 2012-11-21 ENCOUNTER — Ambulatory Visit (HOSPITAL_COMMUNITY)
Admission: RE | Admit: 2012-11-21 | Discharge: 2012-11-21 | Disposition: A | Discharge status: Home / Self Care | Payer: Medicare Other | Source: Ambulatory Visit | Attending: Internal Medicine | Admitting: Internal Medicine

## 2012-11-21 ENCOUNTER — Other Ambulatory Visit (HOSPITAL_BASED_OUTPATIENT_CLINIC_OR_DEPARTMENT_OTHER): Payer: Medicare Other | Admitting: Lab

## 2012-11-21 ENCOUNTER — Encounter (HOSPITAL_COMMUNITY): Payer: Self-pay | Admitting: Gastroenterology

## 2012-11-21 DIAGNOSIS — C4359 Malignant melanoma of other part of trunk: Secondary | ICD-10-CM

## 2012-11-21 DIAGNOSIS — K8020 Calculus of gallbladder without cholecystitis without obstruction: Secondary | ICD-10-CM | POA: Insufficient documentation

## 2012-11-21 LAB — LACTATE DEHYDROGENASE (CC13): LDH: 280 U/L — ABNORMAL HIGH (ref 125–245)

## 2012-11-27 ENCOUNTER — Telehealth: Payer: Self-pay | Admitting: Internal Medicine

## 2012-11-27 ENCOUNTER — Ambulatory Visit (HOSPITAL_BASED_OUTPATIENT_CLINIC_OR_DEPARTMENT_OTHER): Payer: Medicare Other | Admitting: Internal Medicine

## 2012-11-27 ENCOUNTER — Encounter: Payer: Self-pay | Admitting: Internal Medicine

## 2012-11-27 VITALS — BP 129/80 | HR 63 | Temp 97.6°F | Resp 18 | Ht 66.0 in | Wt 181.0 lb

## 2012-11-27 DIAGNOSIS — C4359 Malignant melanoma of other part of trunk: Secondary | ICD-10-CM

## 2012-11-27 NOTE — Patient Instructions (Signed)
Continue on observation with repeat CBC, comprehensive metabolic panel and LDH in 6 months.

## 2012-11-27 NOTE — Telephone Encounter (Signed)
gave pt appt for november 2014 lab and MD

## 2012-11-27 NOTE — Progress Notes (Signed)
[facility] [phone]   [phone]  OFFICE PROGRESS NOTE  [name], MD [address] [place] [address] [place] [address]  DIAGNOSIS: Stage IA malignant melanoma diagnosed in August of 2012   PRIOR THERAPY: Status post wide excision with sentinel lymph node biopsy from the left axilla under the care of Dr. Carolynne Edouard.   CURRENT THERAPY: Observation  INTERVAL HISTORY: [name] 72 y.o. female returns to the clinic today for six-month follow up visit. The patient is feeling fine today with no specific complaints. She denied having any significant weight loss or night sweats. She has no chest pain, shortness of breath, cough or hemoptysis. The patient has no new suspicious lesion on her skin. She had repeat CBC , comprehensive metabolic panel and chest x-ray performed recently and she is here for evaluation and discussion of her lab and imaging results.  MEDICAL HISTORY: Past Medical History  Diagnosis Date  . Weight gain   . Fatigue   . Atypical moles   . Bronchitis   . IBD (inflammatory bowel disease)   . Dizziness   . Retina disorder     left  . LLQ abdominal pain   . Hypothyroidism   . Generalized headache     migraines  . Arthritis     all over  . Anemia     3 years ago  . Complication of anesthesia     shoulder surgery- not all the way asleep  . Insomnia   . Anxiety     in past  . Depression     in past  . GERD (gastroesophageal reflux disease)   . Cancer     x2 stg II melanoma  . History of chemotherapy     topical cream for h/o skin CA    ALLERGIES:  is allergic to epinephrine; penicillins; and valium.  MEDICATIONS:  Current Outpatient Prescriptions  Medication Sig Dispense Refill  . Ascorbic Acid (VITAMIN C) 1000 MG tablet Take 1,000 mg by mouth daily.      Marland Kitchen aspirin 81 MG tablet Take 81 mg by mouth daily.        . Calcium-Magnesium-Vitamin D (CALCIUM MAGNESIUM PO) Take 1 tablet by mouth 2 (two) times  daily.       . Cholecalciferol (VITAMIN D) 2000 UNITS CAPS Take 2 capsules by mouth daily.      . Coenzyme Q10 (COQ10 PO) Take 200 mg by mouth daily.       . Cyanocobalamin (VITAMIN B 12 PO) Take 3,000 mcg by mouth daily.      . diphenhydrAMINE (BENADRYL) 50 MG capsule Take 50 mg by mouth at bedtime as needed for sleep.      Marland Kitchen esomeprazole (NEXIUM) 40 MG capsule Take 40 mg by mouth daily before breakfast.      . fish oil-omega-3 fatty acids 1000 MG capsule Take 1 g by mouth daily.      . folic acid (FOLVITE) 800 MCG tablet Take 800 mcg by mouth daily.      Marland Kitchen gabapentin (NEURONTIN) 100 MG capsule Take 100 mg by mouth 3 (three) times daily.       Marland Kitchen ibuprofen (ADVIL,MOTRIN) 200 MG tablet Take 200 mg by mouth every 6 (six) hours as needed for pain.       Marland Kitchen levothyroxine (SYNTHROID, LEVOTHROID) 25 MCG tablet Take 25 mcg by mouth daily before breakfast.       . Melatonin 3 MG TABS Take 2 tablets  by mouth at bedtime.       No current facility-administered medications for this visit.    SURGICAL HISTORY:  Past Surgical History  Procedure Laterality Date  . Shoulder surgery      right  . Knee arthroscopy    . Closed manipulation shoulder      rt  . Rotator cuff repair      left  . Tumor removal      ovary  . Total knee arthroplasty      right  . Melanoma excision with sentinel lymph node biopsy  03/18/11    Back, nodes neg  . Joint replacement      RTK  . Left knee miniscus tear repair    . Colonscopy      May 2014  . Colonoscopy with propofol N/A 11/20/2012    Procedure: COLONOSCOPY WITH PROPOFOL;  Surgeon: Charolett Bumpers, MD;  Location: [facility];  Service: Endoscopy;  Laterality: N/A;    REVIEW OF SYSTEMS:  A comprehensive review of systems was negative.   PHYSICAL EXAMINATION: General appearance: alert, cooperative and no distress Head: Normocephalic, without obvious abnormality, atraumatic Neck: no adenopathy Lymph nodes: Cervical, supraclavicular, and axillary nodes  normal. Resp: clear to auscultation bilaterally Cardio: regular rate and rhythm, S1, S2 normal, no murmur, click, rub or gallop GI: soft, non-tender; bowel sounds normal; no masses,  no organomegaly Extremities: extremities normal, atraumatic, no cyanosis or edema Skin exam showed no suspicious lesion for melanoma.  ECOG PERFORMANCE STATUS: 0 - Asymptomatic  There were no vitals taken for this visit.  LABORATORY DATA: Lab Results  Component Value Date   WBC 9.3 11/20/2012   HGB 13.6 11/20/2012   HCT 39.7 11/20/2012   MCV 88.6 11/20/2012   PLT 212 11/20/2012      Chemistry      Component Value Date/Time   NA 141 11/20/2012 1700   NA 142 05/22/2012 1004   K 3.4* 11/20/2012 1700   K 4.3 05/22/2012 1004   CL 102 11/20/2012 1700   CL 113* 05/22/2012 1004   CO2 28 11/20/2012 1700   CO2 22 05/22/2012 1004   BUN 11 11/20/2012 1700   BUN 18.0 05/22/2012 1004   CREATININE 0.75 11/20/2012 1700   CREATININE 0.79 07/26/2012 0834   CREATININE 0.9 05/22/2012 1004      Component Value Date/Time   CALCIUM 9.2 11/20/2012 1700   CALCIUM 9.5 05/22/2012 1004   ALKPHOS 72 11/20/2012 1700   ALKPHOS 78 05/22/2012 1004   AST 15 11/20/2012 1700   AST 15 05/22/2012 1004   ALT 12 11/20/2012 1700   ALT 19 05/22/2012 1004   BILITOT 1.2 11/20/2012 1700   BILITOT 1.01 05/22/2012 1004       RADIOGRAPHIC STUDIES: Dg Chest 2 View  11/21/2012  *RADIOLOGY REPORT*  Clinical Data: History of melanoma  CHEST - 2 VIEW  Comparison: , 11/20/2012  Findings: Heart size and vascular pattern are normal.  Interval resolution of air filled loops of bowel.  Lungs are clear.  IMPRESSION: No acute abnormalities.  No significant gaseous bowel distension. Not mentioned above, numerous calcified gallstones again seen in the right upper quadrant.   Original Report Authenticated By: Esperanza Heir, M.D.    Dg Abd Acute W/chest  11/20/2012  *RADIOLOGY REPORT*  Clinical Data: Abdominal pain, nausea and diarrhea.  Status post colonoscopy earlier today.  ACUTE  ABDOMEN SERIES (ABDOMEN 2 VIEW & CHEST 1 VIEW)  Comparison: Previous examinations.  Findings: Normal sized heart.  Clear  lungs. Multiple mildly to moderately dilated small bowel loops containing a small number of air fluid levels.  Calcified gallstones in the right upper abdomen. Mild scoliosis.  Right shoulder fixation anchor.  No free peritoneal air.  IMPRESSION:  1.  Dilated, gas-filled small bowel loops containing some air fluid levels.  This is most likely due to reflux of air into the small bowel during the patient's colonoscopy.  A small bowel obstruction can also have this appearance. 2.  Cholelithiasis.   Original Report Authenticated By: Beckie Salts, M.D.    Dg Abd Portable 1v  11/20/2012  *RADIOLOGY REPORT*  Clinical Data: Pain post colonoscopy.  PORTABLE ABDOMEN - 1 VIEW  Comparison: Earlier films of the same day  Findings: Multiple partially calcified stones in the gallbladder. There is gaseous distention of the colon. Interval decrease in degree of small bowel distention with a few scattered fluid levels in the mid abdomen.  No free air.  IMPRESSION: 1.  No free air. 2.  Cholelithiasis.   Original Report Authenticated By: D. Andria Rhein, MD     ASSESSMENT: this is a very pleasant 72 years old white female with history of stage IA malignant melanoma status post wide excision of the lesion from the mid back in addition to a sentinel lymph node biopsy that was negative for malignancy.  PLAN: I discussed the lab and imaging results with the patient today. I recommended for her to continue on observation with repeat CBC, comprehensive metabolic panel and LDH in 6 months. She was advised to call immediately if she has any concerning symptoms in the interval.  All questions were answered. The patient knows to call the clinic with any problems, questions or concerns. We can certainly see the patient much sooner if necessary.

## 2012-11-28 ENCOUNTER — Telehealth: Payer: Self-pay | Admitting: Internal Medicine

## 2012-11-28 NOTE — Telephone Encounter (Signed)
pt called and wanted lab a week b4 the visit.Marland KitchenMarland KitchenMarland KitchenDone

## 2012-12-03 ENCOUNTER — Other Ambulatory Visit: Payer: Self-pay | Admitting: Gastroenterology

## 2012-12-03 DIAGNOSIS — R109 Unspecified abdominal pain: Secondary | ICD-10-CM

## 2012-12-04 ENCOUNTER — Other Ambulatory Visit: Payer: Medicare Other

## 2012-12-24 ENCOUNTER — Other Ambulatory Visit: Payer: Medicare Other

## 2012-12-25 ENCOUNTER — Ambulatory Visit
Admission: RE | Admit: 2012-12-25 | Discharge: 2012-12-25 | Disposition: A | Discharge status: Home / Self Care | Payer: Medicare Other | Source: Ambulatory Visit | Attending: Gastroenterology | Admitting: Gastroenterology

## 2012-12-25 DIAGNOSIS — R109 Unspecified abdominal pain: Secondary | ICD-10-CM

## 2012-12-25 MED ORDER — IOHEXOL 300 MG/ML  SOLN
100.0000 mL | Freq: Once | INTRAMUSCULAR | Status: AC | PRN
  Administered 2012-12-25: 100 mL via INTRAVENOUS

## 2012-12-25 MED ORDER — IOHEXOL 300 MG/ML  SOLN
30.0000 mL | Freq: Once | INTRAMUSCULAR | Status: AC | PRN
  Administered 2012-12-25: 30 mL via ORAL

## 2013-01-23 ENCOUNTER — Ambulatory Visit: Payer: Self-pay | Admitting: Diagnostic Neuroimaging

## 2013-01-24 ENCOUNTER — Ambulatory Visit (INDEPENDENT_AMBULATORY_CARE_PROVIDER_SITE_OTHER): Payer: Medicare Other | Admitting: General Surgery

## 2013-02-04 ENCOUNTER — Encounter (INDEPENDENT_AMBULATORY_CARE_PROVIDER_SITE_OTHER): Payer: Self-pay | Admitting: General Surgery

## 2013-02-04 ENCOUNTER — Ambulatory Visit (INDEPENDENT_AMBULATORY_CARE_PROVIDER_SITE_OTHER): Payer: Medicare Other | Admitting: General Surgery

## 2013-02-04 VITALS — BP 134/84 | HR 68 | Temp 99.5°F | Resp 15 | Ht 66.5 in | Wt 180.6 lb

## 2013-02-04 DIAGNOSIS — C4359 Malignant melanoma of other part of trunk: Secondary | ICD-10-CM

## 2013-02-04 NOTE — Patient Instructions (Signed)
Continue to check skin

## 2013-02-04 NOTE — Progress Notes (Signed)
Subjective:     Patient ID: [name], female   DOB: February 03, 1941, 71 y.o.   MRN: [id number]  HPI The patient is a 71 year old white female who is 2 years status post wide excision of a melanoma Clark's level III and negative sentinel node biopsy from her back and right axilla. She continues to complain of some soreness but this is mostly from her right upper back and not near the incision. She denies any abdominal pain or nausea. Since her last visit we had her undergo a chest CT which was unremarkable except for some asymptomatic gallstones  Review of Systems  Constitutional: Negative.   HENT: Negative.   Eyes: Negative.   Respiratory: Negative.   Cardiovascular: Negative.   Gastrointestinal: Negative.   Endocrine: Negative.   Genitourinary: Negative.   Musculoskeletal: Negative.   Skin: Negative.   Allergic/Immunologic: Negative.   Neurological: Negative.   Hematological: Negative.   Psychiatric/Behavioral: Negative.        Objective:   Physical Exam  Constitutional: She is oriented to person, place, and time. She appears well-developed and well-nourished.  HENT:  Head: Normocephalic and atraumatic.  Eyes: Conjunctivae and EOM are normal. Pupils are equal, round, and reactive to light.  Neck: Normal range of motion. Neck supple.  Cardiovascular: Normal rate, regular rhythm and normal heart sounds.   Pulmonary/Chest: Effort normal and breath sounds normal.  Her incision on her back has healed nicely. There is no palpable mass or nodularity to the incision or the surrounding tissue. There is no palpable right axillary, supraclavicular or cervical lymphadenopathy  Abdominal: Soft. Bowel sounds are normal. She exhibits no mass. There is no tenderness.  Musculoskeletal: Normal range of motion.  Lymphadenopathy:    She has no cervical adenopathy.  Neurological: She is alert and oriented to person, place, and time.  Skin: Skin is warm and dry.  Psychiatric: She has a normal mood  and affect. Her behavior is normal.       Assessment:     The patient is 2 years status post wide excision of a melanoma on her back     Plan:     At this point she will continue to check her skin regularly. She will continue to follow up with her medical oncologist. We will plan to see her back in about 6 months.

## 2013-02-05 ENCOUNTER — Telehealth (INDEPENDENT_AMBULATORY_CARE_PROVIDER_SITE_OTHER): Payer: Self-pay

## 2013-02-05 NOTE — Telephone Encounter (Signed)
FYI  : Patient states she forgot to tell Dr. Carolynne Edouard she still has soreness at the incision site althought she is 2 yrs out.

## 2013-03-29 ENCOUNTER — Other Ambulatory Visit: Payer: Self-pay

## 2013-03-29 MED ORDER — GABAPENTIN 100 MG PO CAPS: 100.0000 mg | ORAL_CAPSULE | Freq: Three times a day (TID) | ORAL | Status: DC

## 2013-03-29 NOTE — Telephone Encounter (Signed)
Former [facility] patient assigned to Dr Marjory Lies.  Has apt in Feb 2015.

## 2013-04-10 ENCOUNTER — Other Ambulatory Visit: Payer: Self-pay | Admitting: Dermatology

## 2013-05-22 ENCOUNTER — Telehealth: Payer: Self-pay | Admitting: Internal Medicine

## 2013-05-22 NOTE — Telephone Encounter (Signed)
pt called to r/s lab due to dealth of a friend

## 2013-05-23 ENCOUNTER — Other Ambulatory Visit: Payer: Medicare Other | Admitting: Lab

## 2013-05-24 ENCOUNTER — Other Ambulatory Visit (HOSPITAL_BASED_OUTPATIENT_CLINIC_OR_DEPARTMENT_OTHER): Payer: Medicare Other | Admitting: Lab

## 2013-05-24 DIAGNOSIS — C4359 Malignant melanoma of other part of trunk: Secondary | ICD-10-CM

## 2013-05-24 LAB — CBC WITH DIFFERENTIAL/PLATELET
BASO%: 1.2 % (ref 0.0–2.0)
Basophils Absolute: 0.1 10*3/uL (ref 0.0–0.1)
EOS%: 2.8 % (ref 0.0–7.0)
Eosinophils Absolute: 0.2 10*3/uL (ref 0.0–0.5)
HCT: 41.6 % (ref 34.8–46.6)
HGB: 13.8 g/dL (ref 11.6–15.9)
LYMPH%: 28.4 % (ref 14.0–49.7)
MCH: 30.5 pg (ref 25.1–34.0)
MCHC: 33.3 g/dL (ref 31.5–36.0)
MCV: 91.6 fL (ref 79.5–101.0)
MONO#: 0.5 10*3/uL (ref 0.1–0.9)
MONO%: 8.5 % (ref 0.0–14.0)
NEUT#: 3.2 10*3/uL (ref 1.5–6.5)
NEUT%: 59.1 % (ref 38.4–76.8)
Platelets: 211 10*3/uL (ref 145–400)
RBC: 4.54 10*6/uL (ref 3.70–5.45)
RDW: 13.4 % (ref 11.2–14.5)
WBC: 5.5 10*3/uL (ref 3.9–10.3)
lymph#: 1.6 10*3/uL (ref 0.9–3.3)

## 2013-05-24 LAB — COMPREHENSIVE METABOLIC PANEL (CC13)
ALT: 15 U/L (ref 0–55)
AST: 15 U/L (ref 5–34)
Albumin: 3.9 g/dL (ref 3.5–5.0)
Alkaline Phosphatase: 78 U/L (ref 40–150)
Anion Gap: 11 mEq/L (ref 3–11)
BUN: 21.5 mg/dL (ref 7.0–26.0)
CO2: 24 mEq/L (ref 22–29)
Calcium: 9.4 mg/dL (ref 8.4–10.4)
Chloride: 108 mEq/L (ref 98–109)
Creatinine: 0.9 mg/dL (ref 0.6–1.1)
Glucose: 100 mg/dl (ref 70–140)
Potassium: 4.1 mEq/L (ref 3.5–5.1)
Sodium: 142 mEq/L (ref 136–145)
Total Bilirubin: 1.02 mg/dL (ref 0.20–1.20)
Total Protein: 6.9 g/dL (ref 6.4–8.3)

## 2013-05-24 LAB — LACTATE DEHYDROGENASE (CC13): LDH: 225 U/L (ref 125–245)

## 2013-05-30 ENCOUNTER — Other Ambulatory Visit: Payer: Medicare Other | Admitting: Lab

## 2013-05-30 ENCOUNTER — Ambulatory Visit (HOSPITAL_BASED_OUTPATIENT_CLINIC_OR_DEPARTMENT_OTHER): Payer: Medicare Other | Admitting: Internal Medicine

## 2013-05-30 ENCOUNTER — Telehealth: Payer: Self-pay | Admitting: Internal Medicine

## 2013-05-30 ENCOUNTER — Encounter: Payer: Self-pay | Admitting: Internal Medicine

## 2013-05-30 VITALS — BP 143/74 | HR 71 | Temp 97.0°F | Resp 18 | Ht 66.5 in | Wt 184.8 lb

## 2013-05-30 DIAGNOSIS — C4359 Malignant melanoma of other part of trunk: Secondary | ICD-10-CM

## 2013-05-30 NOTE — Progress Notes (Signed)
[facility] [phone]   [phone]  OFFICE PROGRESS NOTE  Leah Showers, MD [address] [place] [address] [place] [address]  DIAGNOSIS: Stage IA malignant melanoma diagnosed in August of 2012   PRIOR THERAPY: Status post wide excision with sentinel lymph node biopsy from the left axilla under the care of Dr. Carolynne Edouard.   CURRENT THERAPY: Observation  INTERVAL HISTORY: [name] 72 y.o. female returns to the clinic today for six-month follow up visit. The patient is feeling fine today with no specific complaints. She denied having any significant weight loss or night sweats. She has no chest pain, shortness of breath, cough or hemoptysis. The patient has no new suspicious lesion on her skin. She had repeat CBC , and comprehensive metabolic panel performed recently and she is here for evaluation and discussion of her lab results. She is seen regularly by her dermatologist Dr. Juliette Alcide for dermatologic evaluation.  MEDICAL HISTORY: Past Medical History  Diagnosis Date  . Weight gain   . Fatigue   . Atypical moles   . Bronchitis   . IBD (inflammatory bowel disease)   . Dizziness   . Retina disorder     left  . LLQ abdominal pain   . Hypothyroidism   . Generalized headache     migraines  . Arthritis     all over  . Anemia     3 years ago  . Complication of anesthesia     shoulder surgery- not all the way asleep  . Insomnia   . Anxiety     in past  . Depression     in past  . GERD (gastroesophageal reflux disease)   . Cancer     x2 stg II melanoma  . History of chemotherapy     topical cream for h/o skin CA    ALLERGIES:  is allergic to epinephrine; penicillins; and valium.  MEDICATIONS:  Current Outpatient Prescriptions  Medication Sig Dispense Refill  . albuterol (PROVENTIL) (2.5 MG/3ML) 0.083% nebulizer solution Take 2.5 mg by nebulization every 6 (six) hours as needed for wheezing or shortness of breath.      .  Ascorbic Acid (VITAMIN C) 1000 MG tablet Take 1,000 mg by mouth daily.      Marland Kitchen aspirin 81 MG tablet Take 81 mg by mouth daily.      . Calcium-Magnesium-Vitamin D (CALCIUM MAGNESIUM PO) Take 1 tablet by mouth 2 (two) times daily.       . Cholecalciferol (VITAMIN D) 2000 UNITS CAPS Take 2 capsules by mouth daily.      . Coenzyme Q10 (COQ10 PO) Take 200 mg by mouth daily.       . Cyanocobalamin (VITAMIN B 12 PO) Take 3,000 mcg by mouth daily.      Marland Kitchen esomeprazole (NEXIUM) 40 MG capsule Take 40 mg by mouth daily before breakfast. OTC      . fish oil-omega-3 fatty acids 1000 MG capsule Take 1 g by mouth daily.      . folic acid (FOLVITE) 800 MCG tablet Take 800 mcg by mouth daily. As needed      . gabapentin (NEURONTIN) 100 MG capsule Take 100 mg by mouth 3 (three) times daily. As needed      . ibuprofen (ADVIL,MOTRIN) 200 MG tablet Take 200 mg by mouth every 6 (six) hours as needed for pain.       Marland Kitchen levothyroxine (SYNTHROID, LEVOTHROID) 25 MCG tablet Take 25 mcg  by mouth daily before breakfast.       . Melatonin 3 MG TABS Take 2 tablets by mouth as needed.       . terbinafine (LAMISIL) 250 MG tablet Take 250 mg by mouth daily.       No current facility-administered medications for this visit.    SURGICAL HISTORY:  Past Surgical History  Procedure Laterality Date  . Shoulder surgery      right  . Knee arthroscopy    . Closed manipulation shoulder      rt  . Rotator cuff repair      left  . Tumor removal      ovary  . Total knee arthroplasty      right  . Melanoma excision with sentinel lymph node biopsy  03/18/11    Back, nodes neg  . Joint replacement      RTK  . Left knee miniscus tear repair    . Colonscopy      May 2014  . Colonoscopy with propofol N/A 11/20/2012    Procedure: COLONOSCOPY WITH PROPOFOL;  Surgeon: Charolett Bumpers, MD;  Location: [facility];  Service: Endoscopy;  Laterality: N/A;    REVIEW OF SYSTEMS:  A comprehensive review of systems was negative.   PHYSICAL  EXAMINATION: General appearance: alert, cooperative and no distress Head: Normocephalic, without obvious abnormality, atraumatic Neck: no adenopathy Lymph nodes: Cervical, supraclavicular, and axillary nodes normal. Resp: clear to auscultation bilaterally Cardio: regular rate and rhythm, S1, S2 normal, no murmur, click, rub or gallop GI: soft, non-tender; bowel sounds normal; no masses,  no organomegaly Extremities: extremities normal, atraumatic, no cyanosis or edema Skin exam showed no suspicious lesion for melanoma.  ECOG PERFORMANCE STATUS: 0 - Asymptomatic  Blood pressure 143/74, pulse 71, temperature 97 F (36.1 C), temperature source Oral, resp. rate 18, height 5' 6.5" (1.689 m), weight 184 lb 12.8 oz (83.825 kg).  LABORATORY DATA: Lab Results  Component Value Date   WBC 5.5 05/24/2013   HGB 13.8 05/24/2013   HCT 41.6 05/24/2013   MCV 91.6 05/24/2013   PLT 211 05/24/2013      Chemistry      Component Value Date/Time   NA 142 05/24/2013 1108   NA 141 11/20/2012 1700   K 4.1 05/24/2013 1108   K 3.4* 11/20/2012 1700   CL 102 11/20/2012 1700   CL 113* 05/22/2012 1004   CO2 24 05/24/2013 1108   CO2 28 11/20/2012 1700   BUN 21.5 05/24/2013 1108   BUN 11 11/20/2012 1700   CREATININE 0.9 05/24/2013 1108   CREATININE 0.75 11/20/2012 1700   CREATININE 0.79 07/26/2012 0834      Component Value Date/Time   CALCIUM 9.4 05/24/2013 1108   CALCIUM 9.2 11/20/2012 1700   ALKPHOS 78 05/24/2013 1108   ALKPHOS 72 11/20/2012 1700   AST 15 05/24/2013 1108   AST 15 11/20/2012 1700   ALT 15 05/24/2013 1108   ALT 12 11/20/2012 1700   BILITOT 1.02 05/24/2013 1108   BILITOT 1.2 11/20/2012 1700       RADIOGRAPHIC STUDIES:  ASSESSMENT AND PLAN: this is a very pleasant 72 years old white female with history of stage IA malignant melanoma status post wide excision of the lesion from the mid back in addition to a sentinel lymph node biopsy that was negative for malignancy.  I discussed the lab results with the patient  today. I recommended for her to continue on observation with repeat CBC, comprehensive metabolic panel and  LDH in 6 months in addition to chest x-ray. She was advised to call immediately if she has any concerning symptoms in the interval.  All questions were answered. The patient knows to call the clinic with any problems, questions or concerns. We can certainly see the patient much sooner if necessary.

## 2013-05-30 NOTE — Telephone Encounter (Signed)
gv and printed appt sched and avs for pt for May 2015 °

## 2013-06-01 NOTE — Patient Instructions (Signed)
Follow up visit in 6 months with repeat lab work and chest x-ray

## 2013-08-16 ENCOUNTER — Ambulatory Visit (INDEPENDENT_AMBULATORY_CARE_PROVIDER_SITE_OTHER): Payer: Medicare Other | Admitting: General Surgery

## 2013-08-16 ENCOUNTER — Encounter (INDEPENDENT_AMBULATORY_CARE_PROVIDER_SITE_OTHER): Payer: Self-pay

## 2013-08-16 ENCOUNTER — Encounter (INDEPENDENT_AMBULATORY_CARE_PROVIDER_SITE_OTHER): Payer: Self-pay | Admitting: General Surgery

## 2013-08-16 VITALS — BP 134/62 | HR 68 | Temp 98.0°F | Resp 18

## 2013-08-16 DIAGNOSIS — C4359 Malignant melanoma of other part of trunk: Secondary | ICD-10-CM

## 2013-08-16 NOTE — Patient Instructions (Signed)
Continue regular skin checks

## 2013-08-16 NOTE — Progress Notes (Signed)
Subjective:     Patient ID: [name], female   DOB: 1941/03/11, 73 y.o.   MRN: [id number]  HPI The patient is a 73 year old white female who is 2-1/2 years status post wide excision of a melanoma from her back with a negative sentinel node in her right axilla. The tumor was a Clark's level III. She has been getting her skin checked by Dr. Tonia Brooms. Since her last visit she also had some shortness of breath but this was evaluated and nothing was found by her report. She also occasionally gets some pins and needles type discomfort above her incision on her back. This does not occur on a daily basis.  Review of Systems  Constitutional: Negative.   HENT: Negative.   Eyes: Negative.   Respiratory: Negative.   Cardiovascular: Negative.   Gastrointestinal: Negative.   Endocrine: Negative.   Genitourinary: Negative.   Musculoskeletal: Negative.   Skin: Negative.   Allergic/Immunologic: Negative.   Neurological: Negative.   Hematological: Negative.   Psychiatric/Behavioral: Negative.        Objective:   Physical Exam  Constitutional: She is oriented to person, place, and time. She appears well-developed and well-nourished.  HENT:  Head: Normocephalic and atraumatic.  Eyes: Conjunctivae and EOM are normal. Pupils are equal, round, and reactive to light.  Neck: Normal range of motion. Neck supple.  Cardiovascular: Normal rate, regular rhythm and normal heart sounds.   Pulmonary/Chest: Effort normal and breath sounds normal.  There are no palpable nodules of her back. Her skin looks like it is healing well. There is no palpable axillary, supraclavicular, or cervical lymphadenopathy  Abdominal: Soft. Bowel sounds are normal.  Musculoskeletal: Normal range of motion.  Lymphadenopathy:    She has no cervical adenopathy.  Neurological: She is alert and oriented to person, place, and time.  Skin: Skin is warm and dry.  Psychiatric: She has a normal mood and affect. Her behavior is normal.        Assessment:     The patient is to have here status post wide excision of a melanoma from the back     Plan:     At this point she will continue to have her skin checked regularly by her dermatologist. She will also follow up with her medical oncologist. We will plan to see her back in another 6 months.

## 2013-09-03 ENCOUNTER — Ambulatory Visit: Payer: Self-pay | Admitting: Diagnostic Neuroimaging

## 2013-09-11 ENCOUNTER — Ambulatory Visit (INDEPENDENT_AMBULATORY_CARE_PROVIDER_SITE_OTHER): Payer: Medicare Other | Admitting: Diagnostic Neuroimaging

## 2013-09-11 ENCOUNTER — Encounter: Payer: Self-pay | Admitting: Diagnostic Neuroimaging

## 2013-09-11 VITALS — BP 142/80 | HR 73 | Ht 66.5 in | Wt 188.5 lb

## 2013-09-11 DIAGNOSIS — G609 Hereditary and idiopathic neuropathy, unspecified: Secondary | ICD-10-CM
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
## 2013-09-11 NOTE — Patient Instructions (Signed)
Continue gabapentin 100mg  at bedtime.  Follow up as needed.

## 2013-10-11 ENCOUNTER — Other Ambulatory Visit: Payer: Self-pay | Admitting: Medical Oncology

## 2013-10-22 ENCOUNTER — Other Ambulatory Visit: Payer: Self-pay | Admitting: Radiology

## 2013-10-30 ENCOUNTER — Encounter (INDEPENDENT_AMBULATORY_CARE_PROVIDER_SITE_OTHER): Payer: Self-pay

## 2013-11-08 ENCOUNTER — Ambulatory Visit
Admission: RE | Admit: 2013-11-08 | Discharge: 2013-11-08 | Disposition: A | Discharge status: Home / Self Care | Payer: Medicare Other | Source: Ambulatory Visit | Attending: Internal Medicine | Admitting: Internal Medicine

## 2013-11-08 ENCOUNTER — Encounter (INDEPENDENT_AMBULATORY_CARE_PROVIDER_SITE_OTHER): Payer: Self-pay

## 2013-11-08 ENCOUNTER — Other Ambulatory Visit: Payer: Self-pay | Admitting: Internal Medicine

## 2013-11-08 DIAGNOSIS — M25569 Pain in unspecified knee: Secondary | ICD-10-CM

## 2013-11-13 ENCOUNTER — Telehealth: Payer: Self-pay | Admitting: Internal Medicine

## 2013-11-13 NOTE — Telephone Encounter (Signed)
s.lw. pt and advised on d.t. change of MD visit due to pal...pt ok and aware

## 2013-11-21 ENCOUNTER — Encounter (INDEPENDENT_AMBULATORY_CARE_PROVIDER_SITE_OTHER): Payer: Self-pay

## 2013-11-28 ENCOUNTER — Ambulatory Visit (HOSPITAL_COMMUNITY)
Admission: RE | Admit: 2013-11-28 | Discharge: 2013-11-28 | Disposition: A | Discharge status: Home / Self Care | Payer: Medicare Other | Source: Ambulatory Visit | Attending: Internal Medicine | Admitting: Internal Medicine

## 2013-11-28 ENCOUNTER — Other Ambulatory Visit (HOSPITAL_BASED_OUTPATIENT_CLINIC_OR_DEPARTMENT_OTHER): Payer: Medicare Other

## 2013-11-28 DIAGNOSIS — C4359 Malignant melanoma of other part of trunk: Secondary | ICD-10-CM | POA: Insufficient documentation

## 2013-11-28 LAB — CBC WITH DIFFERENTIAL/PLATELET
BASO%: 0.8 % (ref 0.0–2.0)
Basophils Absolute: 0 10*3/uL (ref 0.0–0.1)
EOS%: 2.6 % (ref 0.0–7.0)
Eosinophils Absolute: 0.2 10*3/uL (ref 0.0–0.5)
HCT: 41.4 % (ref 34.8–46.6)
HGB: 13.7 g/dL (ref 11.6–15.9)
LYMPH%: 20.1 % (ref 14.0–49.7)
MCH: 30.3 pg (ref 25.1–34.0)
MCHC: 33 g/dL (ref 31.5–36.0)
MCV: 91.7 fL (ref 79.5–101.0)
MONO#: 0.5 10*3/uL (ref 0.1–0.9)
MONO%: 9.1 % (ref 0.0–14.0)
NEUT#: 4 10*3/uL (ref 1.5–6.5)
NEUT%: 67.4 % (ref 38.4–76.8)
Platelets: 220 10*3/uL (ref 145–400)
RBC: 4.52 10*6/uL (ref 3.70–5.45)
RDW: 13.4 % (ref 11.2–14.5)
WBC: 6 10*3/uL (ref 3.9–10.3)
lymph#: 1.2 10*3/uL (ref 0.9–3.3)

## 2013-11-28 LAB — COMPREHENSIVE METABOLIC PANEL (CC13)
ALT: 16 U/L (ref 0–55)
AST: 13 U/L (ref 5–34)
Albumin: 3.8 g/dL (ref 3.5–5.0)
Alkaline Phosphatase: 74 U/L (ref 40–150)
Anion Gap: 10 mEq/L (ref 3–11)
BUN: 28.3 mg/dL — ABNORMAL HIGH (ref 7.0–26.0)
CO2: 26 mEq/L (ref 22–29)
Calcium: 9.2 mg/dL (ref 8.4–10.4)
Chloride: 109 mEq/L (ref 98–109)
Creatinine: 1 mg/dL (ref 0.6–1.1)
Glucose: 88 mg/dl (ref 70–140)
Potassium: 4.2 mEq/L (ref 3.5–5.1)
Sodium: 144 mEq/L (ref 136–145)
Total Bilirubin: 0.83 mg/dL (ref 0.20–1.20)
Total Protein: 6.5 g/dL (ref 6.4–8.3)

## 2013-11-28 LAB — LACTATE DEHYDROGENASE (CC13): LDH: 209 U/L (ref 125–245)

## 2013-12-05 ENCOUNTER — Ambulatory Visit: Payer: Medicare Other | Admitting: Internal Medicine

## 2013-12-11 ENCOUNTER — Encounter: Payer: Self-pay | Admitting: Internal Medicine

## 2013-12-11 ENCOUNTER — Telehealth: Payer: Self-pay | Admitting: Internal Medicine

## 2013-12-11 ENCOUNTER — Ambulatory Visit (HOSPITAL_BASED_OUTPATIENT_CLINIC_OR_DEPARTMENT_OTHER): Payer: Medicare Other | Admitting: Internal Medicine

## 2013-12-11 VITALS — BP 165/60 | HR 57 | Temp 98.6°F | Resp 18 | Ht 66.5 in | Wt 185.8 lb

## 2013-12-11 DIAGNOSIS — C4359 Malignant melanoma of other part of trunk: Secondary | ICD-10-CM

## 2013-12-11 NOTE — Progress Notes (Signed)
[place] Telephone:([phone]) [phone]   Fax:(336) [place], [place] [address]  DIAGNOSIS: Stage IA malignant melanoma diagnosed in August of 2012   PRIOR THERAPY: Status post wide excision with sentinel lymph node biopsy from the left axilla under the care of Dr. Marlou Starks.   CURRENT THERAPY: Observation  INTERVAL HISTORY: [name] 73 y.o. female returns to the clinic today for six-month follow up visit. The patient is feeling fine today with no specific complaints. She denied having any significant weight loss or night sweats. She has no chest pain, shortness of breath, cough or hemoptysis. She had repeat CBC , and comprehensive metabolic panel as well as chest x-ray performed recently and she is here for evaluation and discussion of her lab results. She is seen regularly by her dermatologist Dr. Aniceto Boss for dermatologic evaluation. She is concerned about few skin lesions and she would see her dermatologist for evaluation.  MEDICAL HISTORY: Past Medical History  Diagnosis Date  . Weight gain   . Fatigue   . Atypical moles   . Bronchitis   . IBD (inflammatory bowel disease)   . Dizziness   . Retina disorder     left  . LLQ abdominal pain   . Hypothyroidism   . Generalized headache     migraines  . Arthritis     all over  . Anemia     3 years ago  . Complication of anesthesia     shoulder surgery- not all the way asleep  . Insomnia   . Anxiety     in past  . Depression     in past  . GERD (gastroesophageal reflux disease)   . Cancer     x2 stg II melanoma  . History of chemotherapy     topical cream for h/o skin CA    ALLERGIES:  is allergic to epinephrine; penicillins; and valium.  MEDICATIONS:  Current Outpatient Prescriptions  Medication Sig Dispense Refill  . albuterol (PROVENTIL) (2.5 MG/3ML) 0.083% nebulizer solution Take 2.5 mg by nebulization every 6 (six) hours as  needed for wheezing or shortness of breath.      . Ascorbic Acid (VITAMIN C) 1000 MG tablet Take 1,000 mg by mouth daily.      Marland Kitchen aspirin 81 MG tablet Take 81 mg by mouth daily.      . Calcium-Magnesium-Vitamin D (CALCIUM MAGNESIUM PO) Take 1 tablet by mouth 2 (two) times daily.       . Cholecalciferol (VITAMIN D) 2000 UNITS CAPS Take 2 capsules by mouth daily.      . Coenzyme Q10 (COQ10 PO) Take 200 mg by mouth daily.       . Cyanocobalamin (VITAMIN B 12 PO) Take 3,000 mcg by mouth daily.      Marland Kitchen esomeprazole (NEXIUM) 40 MG capsule Take 40 mg by mouth daily before breakfast. OTC      . fish oil-omega-3 fatty acids 1000 MG capsule Take 1 g by mouth daily.      . folic acid (FOLVITE) 102 MCG tablet Take 800 mcg by mouth daily. As needed      . gabapentin (NEURONTIN) 100 MG capsule Take 100 mg by mouth 3 (three) times daily. As needed      . ibuprofen (ADVIL,MOTRIN) 200 MG tablet Take 200 mg by mouth every 6 (six) hours as needed for pain.       Marland Kitchen levothyroxine (  SYNTHROID, LEVOTHROID) 25 MCG tablet Take 25 mcg by mouth daily before breakfast.       . Melatonin 3 MG TABS Take 2 tablets by mouth as needed.        No current facility-administered medications for this visit.    SURGICAL HISTORY:  Past Surgical History  Procedure Laterality Date  . Shoulder surgery      right  . Knee arthroscopy    . Closed manipulation shoulder      rt  . Rotator cuff repair      left  . Tumor removal      ovary  . Total knee arthroplasty      right  . Melanoma excision with sentinel lymph node biopsy  03/18/11    Back, nodes neg  . Joint replacement      RTK  . Left knee miniscus tear repair    . Colonscopy      May 2014  . Colonoscopy with propofol N/A 11/20/2012    Procedure: COLONOSCOPY WITH PROPOFOL;  Surgeon: Garlan Fair, MD;  Location: [facility];  Service: Endoscopy;  Laterality: N/A;    REVIEW OF SYSTEMS:  A comprehensive review of systems was negative.   PHYSICAL EXAMINATION:  General appearance: alert, cooperative and no distress Head: Normocephalic, without obvious abnormality, atraumatic Neck: no adenopathy Lymph nodes: Cervical, supraclavicular, and axillary nodes normal. Resp: clear to auscultation bilaterally Cardio: regular rate and rhythm, S1, S2 normal, no murmur, click, rub or gallop GI: soft, non-tender; bowel sounds normal; no masses,  no organomegaly Extremities: extremities normal, atraumatic, no cyanosis or edema Skin exam showed no suspicious lesion for melanoma.  ECOG PERFORMANCE STATUS: 0 - Asymptomatic  Blood pressure 165/60, pulse 57, temperature 98.6 F (37 C), temperature source Oral, resp. rate 18, height 5' 6.5" (1.689 m), weight 185 lb 12.8 oz (84.278 kg).  LABORATORY DATA: Lab Results  Component Value Date   WBC 6.0 11/28/2013   HGB 13.7 11/28/2013   HCT 41.4 11/28/2013   MCV 91.7 11/28/2013   PLT 220 11/28/2013      Chemistry      Component Value Date/Time   NA 144 11/28/2013 1102   NA 141 11/20/2012 1700   K 4.2 11/28/2013 1102   K 3.4* 11/20/2012 1700   CL 102 11/20/2012 1700   CL 113* 05/22/2012 1004   CO2 26 11/28/2013 1102   CO2 28 11/20/2012 1700   BUN 28.3* 11/28/2013 1102   BUN 11 11/20/2012 1700   CREATININE 1.0 11/28/2013 1102   CREATININE 0.75 11/20/2012 1700   CREATININE 0.79 07/26/2012 0834      Component Value Date/Time   CALCIUM 9.2 11/28/2013 1102   CALCIUM 9.2 11/20/2012 1700   ALKPHOS 74 11/28/2013 1102   ALKPHOS 72 11/20/2012 1700   AST 13 11/28/2013 1102   AST 15 11/20/2012 1700   ALT 16 11/28/2013 1102   ALT 12 11/20/2012 1700   BILITOT 0.83 11/28/2013 1102   BILITOT 1.2 11/20/2012 1700       RADIOGRAPHIC STUDIES: Dg Chest 1 View  11/28/2013   CLINICAL DATA:  Staging melanoma of the back  EXAM: CHEST - 1 VIEW  COMPARISON:  11/21/2012  FINDINGS: Upper normal heart size.  Mediastinal contours and pulmonary vascularity normal.  Lungs clear.  No pleural effusion or pneumothorax.  Surgical clips LEFT axilla.  No acute osseous  findings.  IMPRESSION: No acute abnormalities.   Electronically Signed   By: Lavonia Dana M.D.   On: 11/28/2013 13:20  ASSESSMENT AND PLAN: this is a very pleasant 73 years old white female with history of stage IA malignant melanoma status post wide excision of the lesion from the mid back in addition to a sentinel lymph node biopsy that was negative for malignancy.  I discussed the lab and chest x-ray results with the patient today. I recommended for her to continue on observation with repeat CBC, comprehensive metabolic panel and LDH in addition to a chest x-ray in one year. She will see her dermatologist for evaluation of the concerning skin lesions. She was advised to call immediately if she has any concerning symptoms in the interval.  All questions were answered. The patient knows to call the clinic with any problems, questions or concerns. We can certainly see the patient much sooner if necessary.  Disclaimer: This note was dictated with voice recognition software. Similar sounding words can inadvertently be transcribed and may not be corrected upon review.

## 2013-12-11 NOTE — Telephone Encounter (Signed)
gv pt appt schedule for may 2016. pt aware of cxr to be done same day as lab 12/03/14 and has referral.

## 2014-02-11 ENCOUNTER — Encounter (INDEPENDENT_AMBULATORY_CARE_PROVIDER_SITE_OTHER): Payer: Self-pay | Admitting: General Surgery

## 2014-02-11 ENCOUNTER — Ambulatory Visit (INDEPENDENT_AMBULATORY_CARE_PROVIDER_SITE_OTHER): Payer: Medicare Other | Admitting: General Surgery

## 2014-02-11 VITALS — BP 130/76 | HR 72 | Temp 97.8°F | Ht 66.0 in | Wt 186.0 lb

## 2014-02-11 DIAGNOSIS — C4359 Malignant melanoma of other part of trunk: Secondary | ICD-10-CM

## 2014-02-11 NOTE — Patient Instructions (Signed)
Follow up with dermatology for skin check

## 2014-02-12 ENCOUNTER — Encounter (INDEPENDENT_AMBULATORY_CARE_PROVIDER_SITE_OTHER): Payer: Self-pay | Admitting: General Surgery

## 2014-02-12 NOTE — Progress Notes (Signed)
Subjective:     Patient ID: [name], female   DOB: Jul 12, 1941, 73 y.o.   MRN: [id number]  HPI The patient is a 73 year old white female who is 3 years status post wide excision of a melanoma from her back with a negative right axillary sentinel lymph node. She continues to have some occasional shooting pains along her back. These do not last very long. She otherwise has no complaints. She did have a recent chest x-ray that was negative.  Review of Systems  Constitutional: Negative.   HENT: Negative.   Eyes: Negative.   Respiratory: Negative.   Cardiovascular: Negative.   Gastrointestinal: Negative.   Endocrine: Negative.   Genitourinary: Negative.   Musculoskeletal: Negative.   Skin: Negative.   Allergic/Immunologic: Negative.   Neurological: Negative.   Hematological: Negative.   Psychiatric/Behavioral: Negative.        Objective:   Physical Exam  Constitutional: She is oriented to person, place, and time. She appears well-developed and well-nourished.  HENT:  Head: Normocephalic and atraumatic.  Eyes: Conjunctivae and EOM are normal. Pupils are equal, round, and reactive to light.  Neck: Normal range of motion. Neck supple.  Cardiovascular: Normal rate, regular rhythm and normal heart sounds.   Pulmonary/Chest: Effort normal and breath sounds normal.  The scar on her back is stable. There is no palpable mass in this area and no worrisome skin lesions. There is no palpable axillary, supraclavicular, or cervical lymphadenopathy.  Abdominal: Soft. Bowel sounds are normal.  Musculoskeletal: Normal range of motion.  She has one small mole on the bottom of her left foot which is being followed by the dermatologist  Lymphadenopathy:    She has no cervical adenopathy.  Neurological: She is alert and oriented to person, place, and time.  Skin: Skin is warm and dry.  Psychiatric: She has a normal mood and affect. Her behavior is normal.       Assessment:     The patient  is 3 years status post wide excision of a melanoma from her back     Plan:     At this point she will continue to followup with her dermatologist for skin evaluation. I will plan to see her back in about 6 months.

## 2014-02-13 ENCOUNTER — Other Ambulatory Visit: Payer: Self-pay | Admitting: Gynecology

## 2014-02-13 DIAGNOSIS — R102 Pelvic and perineal pain: Secondary | ICD-10-CM

## 2014-02-18 ENCOUNTER — Ambulatory Visit
Admission: RE | Admit: 2014-02-18 | Discharge: 2014-02-18 | Disposition: A | Discharge status: Home / Self Care | Payer: Medicare Other | Source: Ambulatory Visit | Attending: Gynecology | Admitting: Gynecology

## 2014-02-18 DIAGNOSIS — R102 Pelvic and perineal pain: Secondary | ICD-10-CM

## 2014-02-21 ENCOUNTER — Ambulatory Visit: Payer: Self-pay | Admitting: Podiatry

## 2014-03-06 ENCOUNTER — Ambulatory Visit: Payer: Medicare Other | Admitting: Podiatry

## 2014-03-13 ENCOUNTER — Other Ambulatory Visit: Payer: Self-pay | Admitting: Dermatology

## 2014-05-19 ENCOUNTER — Other Ambulatory Visit (HOSPITAL_COMMUNITY): Payer: Medicare Other

## 2014-05-20 ENCOUNTER — Other Ambulatory Visit (HOSPITAL_COMMUNITY): Payer: Self-pay | Admitting: Internal Medicine

## 2014-05-20 ENCOUNTER — Ambulatory Visit (HOSPITAL_COMMUNITY): Payer: Medicare Other | Attending: Cardiology

## 2014-05-20 DIAGNOSIS — I493 Ventricular premature depolarization: Secondary | ICD-10-CM | POA: Diagnosis not present

## 2014-05-20 NOTE — Progress Notes (Signed)
2D Echo completed. 05/20/2014 

## 2014-07-04 DIAGNOSIS — J209 Acute bronchitis, unspecified: Secondary | ICD-10-CM | POA: Insufficient documentation

## 2014-07-04 DIAGNOSIS — J208 Acute bronchitis due to other specified organisms: Secondary | ICD-10-CM | POA: Insufficient documentation

## 2014-09-08 ENCOUNTER — Other Ambulatory Visit: Payer: Self-pay | Admitting: Dermatology

## 2014-09-11 ENCOUNTER — Ambulatory Visit: Payer: Self-pay | Admitting: Diagnostic Neuroimaging

## 2014-11-10 ENCOUNTER — Ambulatory Visit
Admit: 2014-11-10 | Disposition: A | Discharge status: Home / Self Care | Payer: Self-pay | Attending: Unknown Physician Specialty | Admitting: Unknown Physician Specialty

## 2014-12-03 ENCOUNTER — Ambulatory Visit (HOSPITAL_COMMUNITY): Admission: RE | Admit: 2014-12-03 | Payer: Medicare Other | Source: Ambulatory Visit

## 2014-12-03 ENCOUNTER — Other Ambulatory Visit: Payer: Medicare Other

## 2014-12-10 ENCOUNTER — Ambulatory Visit: Payer: Medicare Other | Admitting: Internal Medicine

## 2014-12-21 ENCOUNTER — Inpatient Hospital Stay
Admission: EM | Admit: 2014-12-21 | Discharge: 2014-12-23 | DRG: 440 | Disposition: A | Discharge status: Home / Self Care | Payer: Medicare Other | Attending: Specialist | Admitting: Specialist

## 2014-12-21 ENCOUNTER — Emergency Department: Payer: Medicare Other

## 2014-12-21 DIAGNOSIS — Z85820 Personal history of malignant melanoma of skin: Secondary | ICD-10-CM | POA: Diagnosis not present

## 2014-12-21 DIAGNOSIS — K859 Acute pancreatitis without necrosis or infection, unspecified: Secondary | ICD-10-CM

## 2014-12-21 DIAGNOSIS — Z880 Allergy status to penicillin: Secondary | ICD-10-CM | POA: Diagnosis not present

## 2014-12-21 DIAGNOSIS — K8020 Calculus of gallbladder without cholecystitis without obstruction: Secondary | ICD-10-CM | POA: Diagnosis present

## 2014-12-21 DIAGNOSIS — Z79899 Other long term (current) drug therapy: Secondary | ICD-10-CM | POA: Diagnosis not present

## 2014-12-21 DIAGNOSIS — E039 Hypothyroidism, unspecified: Secondary | ICD-10-CM | POA: Diagnosis present

## 2014-12-21 DIAGNOSIS — G629 Polyneuropathy, unspecified: Secondary | ICD-10-CM | POA: Diagnosis present

## 2014-12-21 DIAGNOSIS — M1990 Unspecified osteoarthritis, unspecified site: Secondary | ICD-10-CM | POA: Diagnosis present

## 2014-12-21 DIAGNOSIS — Z888 Allergy status to other drugs, medicaments and biological substances status: Secondary | ICD-10-CM | POA: Diagnosis not present

## 2014-12-21 DIAGNOSIS — Z8249 Family history of ischemic heart disease and other diseases of the circulatory system: Secondary | ICD-10-CM

## 2014-12-21 DIAGNOSIS — R109 Unspecified abdominal pain: Secondary | ICD-10-CM

## 2014-12-21 DIAGNOSIS — R1030 Lower abdominal pain, unspecified: Secondary | ICD-10-CM

## 2014-12-21 DIAGNOSIS — R1010 Upper abdominal pain, unspecified: Secondary | ICD-10-CM

## 2014-12-21 DIAGNOSIS — R079 Chest pain, unspecified: Secondary | ICD-10-CM

## 2014-12-21 DIAGNOSIS — Z7982 Long term (current) use of aspirin: Secondary | ICD-10-CM

## 2014-12-21 DIAGNOSIS — K219 Gastro-esophageal reflux disease without esophagitis: Secondary | ICD-10-CM | POA: Diagnosis present

## 2014-12-21 HISTORY — DX: Gastro-esophageal reflux disease without esophagitis: K21.9

## 2014-12-21 HISTORY — DX: Malignant melanoma of skin, unspecified: C43.9

## 2014-12-21 HISTORY — DX: Disorder of thyroid, unspecified: E07.9

## 2014-12-21 LAB — TROPONIN I: Troponin I: <0.03 ng/mL (ref <0.031)

## 2014-12-21 LAB — CBC
HCT: 41.5 % (ref 35.0–47.0)
Hemoglobin: 13.7 g/dL (ref 12.0–16.0)
MCH: 29.7 pg (ref 26.0–34.0)
MCHC: 32.9 g/dL (ref 32.0–36.0)
MCV: 90.2 fL (ref 80.0–100.0)
Platelets: 201 10*3/uL (ref 150–440)
RBC: 4.61 MIL/uL (ref 3.80–5.20)
RDW: 13.8 % (ref 11.5–14.5)
WBC: 7.8 10*3/uL (ref 3.6–11.0)

## 2014-12-21 LAB — URINALYSIS COMPLETE WITH MICROSCOPIC (ARMC ONLY)
Bilirubin Urine: NEGATIVE
Glucose, UA: NEGATIVE mg/dL
Ketones, ur: NEGATIVE mg/dL
Leukocytes, UA: NEGATIVE
Nitrite: NEGATIVE
Protein, ur: NEGATIVE mg/dL
RBC / HPF: NONE SEEN RBC/hpf (ref 0–5)
Specific Gravity, Urine: 1.002 — ABNORMAL LOW (ref 1.005–1.030)
Squamous Epithelial / LPF: NONE SEEN
WBC, UA: NONE SEEN WBC/hpf (ref 0–5)
pH: 5 (ref 5.0–8.0)

## 2014-12-21 LAB — BASIC METABOLIC PANEL
Anion gap: 9 (ref 5–15)
BUN: 21 mg/dL — ABNORMAL HIGH (ref 6–20)
CO2: 26 mmol/L (ref 22–32)
Calcium: 8.8 mg/dL — ABNORMAL LOW (ref 8.9–10.3)
Chloride: 107 mmol/L (ref 101–111)
Creatinine, Ser: 0.79 mg/dL (ref 0.44–1.00)
GFR calc Af Amer: >60 mL/min (ref >60)
GFR calc non Af Amer: >60 mL/min (ref >60)
Glucose, Bld: 88 mg/dL (ref 65–99)
Potassium: 3.7 mmol/L (ref 3.5–5.1)
Sodium: 142 mmol/L (ref 135–145)

## 2014-12-21 LAB — HEPATIC FUNCTION PANEL
ALT: 32 U/L (ref 14–54)
AST: 29 U/L (ref 15–41)
Albumin: 4.1 g/dL (ref 3.5–5.0)
Alkaline Phosphatase: 65 U/L (ref 38–126)
Bilirubin, Direct: 0.2 mg/dL (ref 0.1–0.5)
Indirect Bilirubin: 0.7 mg/dL (ref 0.3–0.9)
Total Bilirubin: 0.9 mg/dL (ref 0.3–1.2)
Total Protein: 6.7 g/dL (ref 6.5–8.1)

## 2014-12-21 LAB — LIPASE, BLOOD: Lipase: 2433 U/L — ABNORMAL HIGH (ref 22–51)

## 2014-12-21 MED ORDER — ONDANSETRON HCL 4 MG/2ML IJ SOLN
4.0000 mg | Freq: Four times a day (QID) | INTRAMUSCULAR | Status: DC | PRN
  Administered 2014-12-22 – 2014-12-23 (×4): 4 mg via INTRAVENOUS
  Filled 2014-12-21 (×4): qty 2

## 2014-12-21 MED ORDER — ONDANSETRON HCL 4 MG PO TABS
4.0000 mg | ORAL_TABLET | Freq: Four times a day (QID) | ORAL | Status: DC | PRN
  Filled 2014-12-21: qty 1

## 2014-12-21 MED ORDER — ACETAMINOPHEN 650 MG RE SUPP: 650.0000 mg | Freq: Four times a day (QID) | RECTAL | Status: DC | PRN

## 2014-12-21 MED ORDER — BISACODYL 10 MG RE SUPP: 10.0000 mg | Freq: Every day | RECTAL | Status: DC | PRN

## 2014-12-21 MED ORDER — ACETAMINOPHEN 325 MG PO TABS: 650.0000 mg | ORAL_TABLET | Freq: Four times a day (QID) | ORAL | Status: DC | PRN

## 2014-12-21 MED ORDER — HEPARIN SODIUM (PORCINE) 5000 UNIT/ML IJ SOLN
5000.0000 [IU] | Freq: Three times a day (TID) | INTRAMUSCULAR | Status: DC
  Administered 2014-12-21 – 2014-12-23 (×5): 5000 [IU] via SUBCUTANEOUS
  Filled 2014-12-21 (×5): qty 1

## 2014-12-21 MED ORDER — ALBUTEROL SULFATE (2.5 MG/3ML) 0.083% IN NEBU: 3.0000 mL | INHALATION_SOLUTION | Freq: Four times a day (QID) | RESPIRATORY_TRACT | Status: DC | PRN

## 2014-12-21 MED ORDER — LEVOFLOXACIN IN D5W 500 MG/100ML IV SOLN
500.0000 mg | INTRAVENOUS | Status: DC
  Administered 2014-12-21: 500 mg via INTRAVENOUS
  Filled 2014-12-21: qty 100

## 2014-12-21 MED ORDER — LEVOFLOXACIN IN D5W 500 MG/100ML IV SOLN
INTRAVENOUS | Status: AC
  Filled 2014-12-21: qty 100

## 2014-12-21 MED ORDER — NAPROXEN 500 MG PO TABS
ORAL_TABLET | ORAL | Status: AC
  Administered 2014-12-21: 250 mg via ORAL
  Filled 2014-12-21: qty 1

## 2014-12-21 MED ORDER — ASPIRIN EC 81 MG PO TBEC
81.0000 mg | DELAYED_RELEASE_TABLET | Freq: Every day | ORAL | Status: DC
  Administered 2014-12-21 – 2014-12-23 (×3): 81 mg via ORAL
  Filled 2014-12-21 (×3): qty 1

## 2014-12-21 MED ORDER — IOHEXOL 350 MG/ML SOLN
75.0000 mL | Freq: Once | INTRAVENOUS | Status: AC | PRN
  Administered 2014-12-21: 75 mL via INTRAVENOUS

## 2014-12-21 MED ORDER — POTASSIUM CHLORIDE IN NACL 20-0.45 MEQ/L-% IV SOLN
INTRAVENOUS | Status: DC
  Administered 2014-12-21 – 2014-12-22 (×3): via INTRAVENOUS
  Filled 2014-12-21 (×9): qty 1000

## 2014-12-21 MED ORDER — TRIAMCINOLONE ACETONIDE 0.1 % EX CREA
1.0000 "application " | TOPICAL_CREAM | Freq: Every day | CUTANEOUS | Status: DC
  Administered 2014-12-21 – 2014-12-23 (×3): 1 via TOPICAL
  Filled 2014-12-21: qty 15

## 2014-12-21 MED ORDER — CALCIUM CARBONATE-VITAMIN D 500-200 MG-UNIT PO TABS
1.0000 | ORAL_TABLET | Freq: Every day | ORAL | Status: DC
  Administered 2014-12-21 – 2014-12-23 (×3): 1 via ORAL
  Filled 2014-12-21 (×3): qty 1

## 2014-12-21 MED ORDER — DOCUSATE SODIUM 100 MG PO CAPS
100.0000 mg | ORAL_CAPSULE | Freq: Two times a day (BID) | ORAL | Status: DC
  Administered 2014-12-21 – 2014-12-23 (×4): 100 mg via ORAL
  Filled 2014-12-21 (×4): qty 1

## 2014-12-21 MED ORDER — NAPROXEN 500 MG PO TABS
250.0000 mg | ORAL_TABLET | Freq: Once | ORAL | Status: AC
  Administered 2014-12-21: 250 mg via ORAL

## 2014-12-21 MED ORDER — ALUM & MAG HYDROXIDE-SIMETH 200-200-20 MG/5ML PO SUSP: 30.0000 mL | Freq: Four times a day (QID) | ORAL | Status: DC | PRN

## 2014-12-21 MED ORDER — VITAMIN D3 25 MCG (1000 UNIT) PO TABS
2000.0000 [IU] | ORAL_TABLET | Freq: Every day | ORAL | Status: DC
  Administered 2014-12-21 – 2014-12-23 (×3): 2000 [IU] via ORAL
  Filled 2014-12-21 (×6): qty 2

## 2014-12-21 MED ORDER — SODIUM CHLORIDE 0.9 % IV BOLUS (SEPSIS)
500.0000 mL | Freq: Once | INTRAVENOUS | Status: AC
  Administered 2014-12-21: 500 mL via INTRAVENOUS

## 2014-12-21 MED ORDER — CLOTRIMAZOLE 1 % EX CREA
1.0000 "application " | TOPICAL_CREAM | Freq: Every day | CUTANEOUS | Status: DC
  Administered 2014-12-22: 1 via TOPICAL
  Filled 2014-12-21: qty 15

## 2014-12-21 MED ORDER — FAMOTIDINE IN NACL 20-0.9 MG/50ML-% IV SOLN
20.0000 mg | Freq: Two times a day (BID) | INTRAVENOUS | Status: DC
  Administered 2014-12-21 – 2014-12-23 (×4): 20 mg via INTRAVENOUS
  Filled 2014-12-21 (×6): qty 50

## 2014-12-21 MED ORDER — HYDROMORPHONE HCL 1 MG/ML IJ SOLN
1.0000 mg | INTRAMUSCULAR | Status: DC | PRN
  Administered 2014-12-21: 21:00:00 1 mg via INTRAVENOUS
  Filled 2014-12-21: qty 1

## 2014-12-21 MED ORDER — LEVOTHYROXINE SODIUM 25 MCG PO TABS
25.0000 ug | ORAL_TABLET | Freq: Every day | ORAL | Status: DC
  Administered 2014-12-22 – 2014-12-23 (×2): 25 ug via ORAL
  Filled 2014-12-21 (×2): qty 1
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
## 2014-12-21 NOTE — H&P (Signed)
History and Physical    [name] KVQ:[id number] DOB: [birth year] DOA: 12/21/2014  Referring physician: Dr. Clearnce Hasten PCP: No PCP Per Patient  Specialists: none  Chief Complaint: abdominal pain  HPI: [name] is a 74 y.o. female has a past medical history significant for gallstones and peripheral neuropathy who presents with diffuse chest and abdominal pain associated with N/V. No diarrhea. No fever. Denies ETOH use. Lipase elevated in ER. CT non-diagnostic  Review of Systems: The patient denies anorexia, fever, weight loss,, vision loss, decreased hearing, hoarseness, syncope, dyspnea on exertion, peripheral edema, balance deficits, hemoptysis, melena, hematochezia, severe indigestion/heartburn, hematuria, incontinence, genital sores, muscle weakness, suspicious skin lesions, transient blindness, difficulty walking, depression, unusual weight change, abnormal bleeding, enlarged lymph nodes, angioedema, and breast masses.   Past Medical History  Diagnosis Date  . Acid reflux   . Thyroid disease   . Cancer     melenoma 2013   History reviewed. No pertinent past surgical history. Social History:  reports that she has never smoked. She does not have any smokeless tobacco history on file. She reports that she does not drink alcohol. Her drug history is not on file.  Allergies  Allergen Reactions  . Penicillins Anaphylaxis and Swelling  . Valium [Diazepam] Other (See Comments)    Patient states makes it hard for her to wake up.  Marland Kitchen Epinephrine Palpitations    History reviewed. No pertinent family history.  Prior to Admission medications   Not on File   Physical Exam: Filed Vitals:   12/21/14 1358 12/21/14 1650 12/21/14 1840 12/21/14 1841  BP: 146/58 151/68 156/81   Pulse: 62 60 60 67  Temp: 97.7 F (36.5 C)     TempSrc: Oral     Resp: 18 16 16    Height: 5\' 6"  (1.676 m)     Weight: 83.915 kg (185 lb)     SpO2: 97% 97% 97%      General:  No apparent  distress  Eyes: PERRL, EOMI, no scleral icterus  ENT: moist oropharynx  Neck: supple, no lymphadenopathy  Cardiovascular: regular rate without MRG; 2+ peripheral pulses, no JVD, no peripheral edema  Respiratory: CTA biL, good air movement without wheezing, rhonchi or crackled  Abdomen: soft, diffusely tender to palpation, positive bowel sounds,  Guarding but no rebound  Skin: no rashes  Musculoskeletal: normal bulk and tone, no joint swelling  Psychiatric: normal mood and affect  Neurologic: CN 2-12 grossly intact, MS 5/5 in all 4  Labs on Admission:  Basic Metabolic Panel:  Recent Labs Lab 12/21/14 1413  NA 142  K 3.7  CL 107  CO2 26  GLUCOSE 88  BUN 21*  CREATININE 0.79  CALCIUM 8.8*   Liver Function Tests:  Recent Labs Lab 12/21/14 1413  AST 29  ALT 32  ALKPHOS 65  BILITOT 0.9  PROT 6.7  ALBUMIN 4.1    Recent Labs Lab 12/21/14 1413  LIPASE 2433*   No results for input(s): AMMONIA in the last 168 hours. CBC:  Recent Labs Lab 12/21/14 1413  WBC 7.8  HGB 13.7  HCT 41.5  MCV 90.2  PLT 201   Cardiac Enzymes:  Recent Labs Lab 12/21/14 1413  TROPONINI <0.03    BNP (last 3 results) No results for input(s): BNP in the last 8760 hours.  ProBNP (last 3 results) No results for input(s): PROBNP in the last 8760 hours.  CBG: No results for input(s): GLUCAP in the last 168 hours.  Radiological Exams on Admission: Dg  Chest 2 View  12/21/2014   CLINICAL DATA:  Chest pain started today.  EXAM: CHEST  2 VIEW  COMPARISON:  11/28/2013.  FINDINGS: The cardiac silhouette, mediastinal and hilar contours are within normal limits and stable. There is mild tortuosity of the thoracic aorta. The lungs are clear. No pleural effusion. Mild eventration of the right hemidiaphragm. The bony thorax is intact. Stable surgical changes involving the right shoulder.  IMPRESSION: No acute cardiopulmonary findings.   Electronically Signed   By: Marijo Sanes M.D.   On:  12/21/2014 16:00   Ct Angio Abdomen W/cm &/or Wo Contrast  12/21/2014   CLINICAL DATA:  Chest pain, abdominal pain, and tightness. Shortness of breath since 5 a.m.  EXAM: CT ANGIOGRAPHY CHEST AND ABDOMEN  TECHNIQUE: Multidetector CT imaging of the chest and abdomen was performed using the standard protocol during bolus administration of intravenous contrast. Multiplanar CT image reconstructions and MIPs were obtained to evaluate the vascular anatomy.  CONTRAST:  67mL OMNIPAQUE IOHEXOL 350 MG/ML SOLN  COMPARISON:  CT chest 10/25/2012.  CT abdomen and pelvis 12/25/2012  FINDINGS: CTA CHEST FINDINGS  Mediastinum: No filling defects are noted within the pulmonary arterial tree to suggest pulmonary emboli. Heart size is normal. No pericardial fluid, thickening or calcification. No acute abnormality of the thoracic aorta or other great vessels of the mediastinum. LEFT main and LEFT anterior descending coronary artery calcification. No pathologically enlarged mediastinal or hilar lymph nodes. Small hiatal hernia.  Lungs/Pleura: No consolidative airspace disease. No pleural effusions. No suspicious appearing pulmonary nodules or masses.  Musculoskeletal: No aggressive appearing lytic or blastic lesions are noted in the visualized portions of the skeleton. Subpectoral breast implants.  Review of the MIP images confirms the above findings.  CTA ABDOMEN FINDINGS  There is no evidence for aortic dissection minor atheromatous change at the visceral and renal branch origins. Both iliac arteries widely patent.  Extensive layering gallstones without significant distention or signs of acute cholecystitis. Stable appearing hepatic cystic lesions incompletely evaluated. No renal obstruction. No splenomegaly. No bowel obstruction. Degenerative changes in the spine appears stable. Diverticular disease of the colon incompletely evaluated.  Review of the MIP images confirms the above findings.  IMPRESSION: No evidence for aortic  dissection.  No acute findings in the chest or abdomen.  Cholelithiasis without signs of acute cholecystitis.   Electronically Signed   By: Rolla Flatten M.D.   On: 12/21/2014 19:26   Ct Angio Chest Aorta W/cm &/or Wo/cm  12/21/2014   CLINICAL DATA:  Chest pain, abdominal pain, and tightness. Shortness of breath since 5 a.m.  EXAM: CT ANGIOGRAPHY CHEST AND ABDOMEN  TECHNIQUE: Multidetector CT imaging of the chest and abdomen was performed using the standard protocol during bolus administration of intravenous contrast. Multiplanar CT image reconstructions and MIPs were obtained to evaluate the vascular anatomy.  CONTRAST:  38mL OMNIPAQUE IOHEXOL 350 MG/ML SOLN  COMPARISON:  CT chest 10/25/2012.  CT abdomen and pelvis 12/25/2012  FINDINGS: CTA CHEST FINDINGS  Mediastinum: No filling defects are noted within the pulmonary arterial tree to suggest pulmonary emboli. Heart size is normal. No pericardial fluid, thickening or calcification. No acute abnormality of the thoracic aorta or other great vessels of the mediastinum. LEFT main and LEFT anterior descending coronary artery calcification. No pathologically enlarged mediastinal or hilar lymph nodes. Small hiatal hernia.  Lungs/Pleura: No consolidative airspace disease. No pleural effusions. No suspicious appearing pulmonary nodules or masses.  Musculoskeletal: No aggressive appearing lytic or blastic lesions are noted in  the visualized portions of the skeleton. Subpectoral breast implants.  Review of the MIP images confirms the above findings.  CTA ABDOMEN FINDINGS  There is no evidence for aortic dissection minor atheromatous change at the visceral and renal branch origins. Both iliac arteries widely patent.  Extensive layering gallstones without significant distention or signs of acute cholecystitis. Stable appearing hepatic cystic lesions incompletely evaluated. No renal obstruction. No splenomegaly. No bowel obstruction. Degenerative changes in the spine appears  stable. Diverticular disease of the colon incompletely evaluated.  Review of the MIP images confirms the above findings.  IMPRESSION: No evidence for aortic dissection.  No acute findings in the chest or abdomen.  Cholelithiasis without signs of acute cholecystitis.   Electronically Signed   By: Rolla Flatten M.D.   On: 12/21/2014 19:26    EKG: Independently reviewed.  Assessment/Plan Principal Problem:   Acute pancreatitis Active Problems:   Gallstones   Abdominal pain   Will admit to floor and keep NPO. Begin IV fluids and empiric IV ABX. Consult GI. F/u labs in AM.  Diet: NPO Fluids: see orders DVT Prophylaxis: SQ Heparin  Code Status: FULL  Family Communication: yes  Disposition Plan: home  Time spent: 50 min

## 2014-12-21 NOTE — Plan of Care (Signed)
Problem: Discharge Progression Outcomes Goal: Discharge plan in place and appropriate Individualization Individualization: Pt goes by [name], lives at home alone, has  A hx of thryroid disease, melanoma in 2013, and acid reflux. She does take home medications which are stored in the pharmacy.   Goal: Other Discharge Outcomes/Goals Outcome: Progressing Pt remains npo, complains of epigastric pain, prn pain meds given with relief. Complained of nausea which was relieved after pt vomitted thin yellow emesis. Pt refused medication at that time. New iv was started due to old one infiltrating. She is x1 assist to BR.  Will continue to  Monitor.

## 2014-12-21 NOTE — ED Notes (Signed)
CP pain that began today, bloating and tightness in abdomen. Pt alert and oriented X4, active, cooperative, pt in NAD. RR even and unlabored, color WNL.

## 2014-12-22 ENCOUNTER — Encounter: Payer: Self-pay | Admitting: Urgent Care

## 2014-12-22 ENCOUNTER — Inpatient Hospital Stay: Payer: Medicare Other

## 2014-12-22 DIAGNOSIS — K859 Acute pancreatitis, unspecified: Secondary | ICD-10-CM

## 2014-12-22 LAB — CBC
HCT: 38.7 % (ref 35.0–47.0)
Hemoglobin: 12.9 g/dL (ref 12.0–16.0)
MCH: 29.9 pg (ref 26.0–34.0)
MCHC: 33.3 g/dL (ref 32.0–36.0)
MCV: 90 fL (ref 80.0–100.0)
Platelets: 169 10*3/uL (ref 150–440)
RBC: 4.3 MIL/uL (ref 3.80–5.20)
RDW: 13.7 % (ref 11.5–14.5)
WBC: 8.2 10*3/uL (ref 3.6–11.0)

## 2014-12-22 LAB — COMPREHENSIVE METABOLIC PANEL
ALT: 23 U/L (ref 14–54)
AST: 17 U/L (ref 15–41)
Albumin: 3.6 g/dL (ref 3.5–5.0)
Alkaline Phosphatase: 62 U/L (ref 38–126)
Anion gap: 5 (ref 5–15)
BUN: 17 mg/dL (ref 6–20)
CO2: 25 mmol/L (ref 22–32)
Calcium: 8 mg/dL — ABNORMAL LOW (ref 8.9–10.3)
Chloride: 107 mmol/L (ref 101–111)
Creatinine, Ser: 0.74 mg/dL (ref 0.44–1.00)
GFR calc Af Amer: >60 mL/min (ref >60)
GFR calc non Af Amer: >60 mL/min (ref >60)
Glucose, Bld: 106 mg/dL — ABNORMAL HIGH (ref 65–99)
Potassium: 3.6 mmol/L (ref 3.5–5.1)
Sodium: 137 mmol/L (ref 135–145)
Total Bilirubin: 1.4 mg/dL — ABNORMAL HIGH (ref 0.3–1.2)
Total Protein: 6.2 g/dL — ABNORMAL LOW (ref 6.5–8.1)

## 2014-12-22 LAB — LIPID PANEL
Cholesterol: 181 mg/dL (ref 0–200)
HDL: 57 mg/dL (ref >40)
LDL Cholesterol: 108 mg/dL — ABNORMAL HIGH (ref 0–99)
Total CHOL/HDL Ratio: 3.2 RATIO
Triglycerides: 79 mg/dL (ref <150)
VLDL: 16 mg/dL (ref 0–40)

## 2014-12-22 LAB — LIPASE, BLOOD: Lipase: 264 U/L — ABNORMAL HIGH (ref 22–51)

## 2014-12-22 MED ORDER — HYDROMORPHONE HCL 1 MG/ML IJ SOLN
1.0000 mg | INTRAMUSCULAR | Status: DC | PRN
  Administered 2014-12-22 – 2014-12-23 (×3): 1 mg via INTRAVENOUS
  Filled 2014-12-22 (×3): qty 1

## 2014-12-22 NOTE — Progress Notes (Signed)
Initial Nutrition Assessment  NUTRITION DIAGNOSIS:  Inadequate oral intake related to inability to eat as evidenced by NPO status.  GOAL:   (Goal for diet advancement and tolerance as medically able within 5-7 days)  MONITOR:   (Energy Intake, Gastrointestinal Profile)  REASON FOR ASSESSMENT:   (RD Screen, Diagnosis)    ASSESSMENT:  Pt admitted with abdominal pain secondary to pancreatitis. Pt c/o n/v three times after CL this am. PMHx: Past Medical History  Diagnosis Date  . Acid reflux   . Thyroid disease   . Melanoma 2013    Current Nutrition: pt reports just finishing CL tray prior to visit. Pt reports eating jello and some broth but not wanting to 'overdo it.' Pt reports emesis times 3 after CL this am.  Nutrition Prior to Admission: pt reports last meal Saturday night. Pt reports appetite was healthy and usually eating 2-3 meals per day PTA until Saturday night.  Medications: 0.45%NS with KCl at 137mL/hr, Colace, Calcium-vitamin D Labs: Electrolyte and Renal Profile:  Recent Labs Lab 12/21/14 1413 12/22/14 0432  BUN 21* 17  CREATININE 0.79 0.74  NA 142 137  K 3.7 3.6   Protein Profile:  Recent Labs Lab 12/21/14 1413 12/22/14 0432  ALBUMIN 4.1 3.6   Glucose Profile: No results for input(s): GLUCAP in the last 72 hours. Gastrointestinal Profile: Lipase     Component Value Date/Time   LIPASE 264* 12/22/2014 0432  Lipase on admission 2433  Pt reports weight gain PTA  Height:  Ht Readings from Last 1 Encounters:  12/21/14 5\' 6"  (1.676 m)    Weight:  Wt Readings from Last 1 Encounters:  12/22/14 188 lb (85.276 kg)    Ideal Body Weight:     Wt Readings from Last 10 Encounters:  12/22/14 188 lb (85.276 kg)    BMI:  Body mass index is 30.36 kg/(m^2).  Skin:  Reviewed, no issues  Diet Order:  Diet NPO time specified  EDUCATION NEEDS:  Education needs no appropriate at this time   Intake/Output Summary (Last 24 hours) at 12/22/14  1421 Last data filed at 12/22/14 1100  Gross per 24 hour  Intake      0 ml  Output   1100 ml  Net  -1100 ml    Last BM:  6/4  LOW Care Level  Dwyane Luo, RD, LDN Pager [phone]

## 2014-12-22 NOTE — Progress Notes (Signed)
[facility] NAME: [name]    MR#:  [id number]  DATE OF BIRTH:  [birth year]  SUBJECTIVE:  CHIEF COMPLAINT:   Chief Complaint  Patient presents with  . Chest Pain  . Bloated   Pt. Here w/ vague chest pain,abdominal pain and noted to have elevated lipase consistent with acute pancreatitis.  Lipase has improved.  No N/V.  Pain improved.   REVIEW OF SYSTEMS:    Review of Systems  Constitutional: Negative for fever and chills.  HENT: Negative for congestion and tinnitus.   Eyes: Negative for blurred vision and double vision.  Respiratory: Negative for cough, shortness of breath and wheezing.   Cardiovascular: Negative for chest pain, orthopnea and PND.  Gastrointestinal: Positive for abdominal pain (improved). Negative for nausea, vomiting and diarrhea.  Genitourinary: Negative for dysuria and hematuria.  Skin: Negative for rash.  Neurological: Negative for dizziness, sensory change and focal weakness.  All other systems reviewed and are negative.  Nutrition: Clear liquid diet started this a.m.    DRUG ALLERGIES:   Allergies  Allergen Reactions  . Penicillins Anaphylaxis and Swelling  . Valium [Diazepam] Other (See Comments)    Patient states makes it hard for her to wake up.  Marland Kitchen Epinephrine Palpitations    VITALS:  Blood pressure 141/65, pulse 69, temperature 98.3 F (36.8 C), temperature source Oral, resp. rate 20, height 5\' 6"  (1.676 m), weight 85.276 kg (188 lb), SpO2 94 %.  PHYSICAL EXAMINATION:   Physical Exam  GENERAL:  74 y.o.-year-old patient lying in the bed with no acute distress.  EYES: Pupils equal, round, reactive to light and accommodation. No scleral icterus. Extraocular muscles intact.  HEENT: Head atraumatic, normocephalic. Oropharynx and nasopharynx clear.  NECK:  Supple, no jugular venous distention. No thyroid enlargement, no tenderness.  LUNGS: Normal breath sounds bilaterally, no  wheezing, rales, rhonchi. No use of accessory muscles of respiration.  CARDIOVASCULAR: S1, S2 normal. No murmurs, rubs, gallops, clicks.   ABDOMEN: Soft, nontender, nondistended. Bowel sounds present. No organomegaly or mass.  EXTREMITIES: No cyanosis, clubbing or edema b/l.    NEUROLOGIC: Cranial nerves II through XII are intact. No focal Motor or sensory deficits b/l.   PSYCHIATRIC: The patient is alert and oriented x 3.  SKIN: No obvious rash, lesion, or ulcer.    LABORATORY PANEL:   CBC  Recent Labs Lab 12/22/14 0432  WBC 8.2  HGB 12.9  HCT 38.7  PLT 169   ------------------------------------------------------------------------------------------------------------------  Chemistries   Recent Labs Lab 12/22/14 0432  NA 137  K 3.6  CL 107  CO2 25  GLUCOSE 106*  BUN 17  CREATININE 0.74  CALCIUM 8.0*  AST 17  ALT 23  ALKPHOS 62  BILITOT 1.4*   ------------------------------------------------------------------------------------------------------------------  Cardiac Enzymes  Recent Labs Lab 12/21/14 1413  TROPONINI <0.03   ------------------------------------------------------------------------------------------------------------------  RADIOLOGY:  Dg Chest 2 View  12/21/2014   CLINICAL DATA:  Chest pain started today.  EXAM: CHEST  2 VIEW  COMPARISON:  11/28/2013.  FINDINGS: The cardiac silhouette, mediastinal and hilar contours are within normal limits and stable. There is mild tortuosity of the thoracic aorta. The lungs are clear. No pleural effusion. Mild eventration of the right hemidiaphragm. The bony thorax is intact. Stable surgical changes involving the right shoulder.  IMPRESSION: No acute cardiopulmonary findings.   Electronically Signed   By: Marijo Sanes M.D.   On: 12/21/2014 16:00   Ct Angio Abdomen W/cm &/or Wo  Contrast  12/21/2014   CLINICAL DATA:  Chest pain, abdominal pain, and tightness. Shortness of breath since 5 a.m.  EXAM: CT ANGIOGRAPHY  CHEST AND ABDOMEN  TECHNIQUE: Multidetector CT imaging of the chest and abdomen was performed using the standard protocol during bolus administration of intravenous contrast. Multiplanar CT image reconstructions and MIPs were obtained to evaluate the vascular anatomy.  CONTRAST:  40mL OMNIPAQUE IOHEXOL 350 MG/ML SOLN  COMPARISON:  CT chest 10/25/2012.  CT abdomen and pelvis 12/25/2012  FINDINGS: CTA CHEST FINDINGS  Mediastinum: No filling defects are noted within the pulmonary arterial tree to suggest pulmonary emboli. Heart size is normal. No pericardial fluid, thickening or calcification. No acute abnormality of the thoracic aorta or other great vessels of the mediastinum. LEFT main and LEFT anterior descending coronary artery calcification. No pathologically enlarged mediastinal or hilar lymph nodes. Small hiatal hernia.  Lungs/Pleura: No consolidative airspace disease. No pleural effusions. No suspicious appearing pulmonary nodules or masses.  Musculoskeletal: No aggressive appearing lytic or blastic lesions are noted in the visualized portions of the skeleton. Subpectoral breast implants.  Review of the MIP images confirms the above findings.  CTA ABDOMEN FINDINGS  There is no evidence for aortic dissection minor atheromatous change at the visceral and renal branch origins. Both iliac arteries widely patent.  Extensive layering gallstones without significant distention or signs of acute cholecystitis. Stable appearing hepatic cystic lesions incompletely evaluated. No renal obstruction. No splenomegaly. No bowel obstruction. Degenerative changes in the spine appears stable. Diverticular disease of the colon incompletely evaluated.  Review of the MIP images confirms the above findings.  IMPRESSION: No evidence for aortic dissection.  No acute findings in the chest or abdomen.  Cholelithiasis without signs of acute cholecystitis.   Electronically Signed   By: Rolla Flatten M.D.   On: 12/21/2014 19:26   Ct Angio  Chest Aorta W/cm &/or Wo/cm  12/21/2014   CLINICAL DATA:  Chest pain, abdominal pain, and tightness. Shortness of breath since 5 a.m.  EXAM: CT ANGIOGRAPHY CHEST AND ABDOMEN  TECHNIQUE: Multidetector CT imaging of the chest and abdomen was performed using the standard protocol during bolus administration of intravenous contrast. Multiplanar CT image reconstructions and MIPs were obtained to evaluate the vascular anatomy.  CONTRAST:  67mL OMNIPAQUE IOHEXOL 350 MG/ML SOLN  COMPARISON:  CT chest 10/25/2012.  CT abdomen and pelvis 12/25/2012  FINDINGS: CTA CHEST FINDINGS  Mediastinum: No filling defects are noted within the pulmonary arterial tree to suggest pulmonary emboli. Heart size is normal. No pericardial fluid, thickening or calcification. No acute abnormality of the thoracic aorta or other great vessels of the mediastinum. LEFT main and LEFT anterior descending coronary artery calcification. No pathologically enlarged mediastinal or hilar lymph nodes. Small hiatal hernia.  Lungs/Pleura: No consolidative airspace disease. No pleural effusions. No suspicious appearing pulmonary nodules or masses.  Musculoskeletal: No aggressive appearing lytic or blastic lesions are noted in the visualized portions of the skeleton. Subpectoral breast implants.  Review of the MIP images confirms the above findings.  CTA ABDOMEN FINDINGS  There is no evidence for aortic dissection minor atheromatous change at the visceral and renal branch origins. Both iliac arteries widely patent.  Extensive layering gallstones without significant distention or signs of acute cholecystitis. Stable appearing hepatic cystic lesions incompletely evaluated. No renal obstruction. No splenomegaly. No bowel obstruction. Degenerative changes in the spine appears stable. Diverticular disease of the colon incompletely evaluated.  Review of the MIP images confirms the above findings.  IMPRESSION: No evidence for  aortic dissection.  No acute findings in the  chest or abdomen.  Cholelithiasis without signs of acute cholecystitis.   Electronically Signed   By: Rolla Flatten M.D.   On: 12/21/2014 19:26     ASSESSMENT AND PLAN:   74 yo female w/ hx of GERD, hypothyroidism, hx of Melanoma, OA who presented to the hospital w/ vague chest/abdominal pain and N/V and noted to have elevated Lipase consistent w/ acute pancreatitis.   1. Acute Pancreatitis - suspected diagnosis given elevated Lipase w/ abdominal pain, N/V.  - CT abd/pelvis showing no acute pathology or evidence of acute pancreatitis.  - LFt's normal.  NO hx of EtOH abuse.  Will check Lipid profile.  - Lipase improved and clinically feels better.  - will start on clear liquid diet. Cont. IV fluids, anti-emetics, pain control.   2. Hypothyroidism - cont. Synthroid.   3. GERD - cont. Pepcid.    4. OA - cont. Calcium w/ Vit D supplements.    All the records are reviewed and case discussed with Care Management/Social Workerr. Management plans discussed with the patient, family and they are in agreement.  CODE STATUS: Full  DVT Prophylaxis: Heparin SQ   TOTAL TIME TAKING CARE OF THIS PATIENT: 30 minutes.   POSSIBLE D/C IN 1-2 DAYS, DEPENDING ON CLINICAL CONDITION.   Henreitta Leber M.D on 12/22/2014 at 9:39 AM  Between 7am to 6pm - Pager - [phone]  After 6pm go to [email or web address] - password [email or web address] [facility]  [facility]  Office  [phone]  CC: Primary care physician; No PCP Per Patient

## 2014-12-22 NOTE — Consult Note (Signed)
Gastroenterology Consultation  Referring Provider:     Dr Doy Hutching Primary Care Physician:  No PCP Per Patient Primary Gastroenterologist:  n/a   Reason for Consultation:     Acute panceratitis  Date of Admission:  12/21/2014 Date of Consultation:  12/22/2014        HPI:   [name] is a 74 y.o. female admitted with acute pancreatitis.  This is her first episode of pancreatitis. She had severe chest pain that began 2 days ago. Pain is 7/10 on pain scale. Pain is now in mid abdomen. She feels bloated. She could not eat as the pain was so severe. She had nausea and vomiting. Pain is currently 7/10 pain scale. CT abdomen with contrast shows Cholelithiasis without signs of acute cholecystitis or pancreatitis, however lipase was elevated 2433, down to 264.  She denies any alcohol use or illicit drug use. She denies any history of hyper triglyceridemia. She denies any new medications.  Denies constipation, diarrhea, rectal bleeding, melena or weight loss.    Past Medical History  Diagnosis Date  . Acid reflux   . Thyroid disease   . Melanoma 2013    History reviewed. No pertinent past surgical history.  Prior to Admission medications   Medication Sig Start Date End Date Taking? Authorizing Provider  albuterol (PROVENTIL HFA;VENTOLIN HFA) 108 (90 BASE) MCG/ACT inhaler Inhale 1-2 puffs into the lungs every 6 (six) hours as needed for wheezing or shortness of breath.   Yes Historical Provider, MD  aspirin EC 81 MG tablet Take 81 mg by mouth daily.   Yes Historical Provider, MD  calcium-vitamin D (OSCAL WITH D) 500-200 MG-UNIT per tablet Take 1 tablet by mouth daily.   Yes Historical Provider, MD  Cholecalciferol (VITAMIN D3) 2000 UNITS capsule Take 2,000 Units by mouth daily.   Yes Historical Provider, MD  clotrimazole (LOTRIMIN) 1 % cream Apply 1 application topically at bedtime. 12/02/14  Yes Historical Provider, MD  esomeprazole (NEXIUM) 20 MG capsule Take 20 mg by mouth daily at 12 noon.   Yes  Historical Provider, MD  ibuprofen (ADVIL,MOTRIN) 800 MG tablet Take 800 mg by mouth every 8 (eight) hours as needed. 12/18/14  Yes Historical Provider, MD  levothyroxine (SYNTHROID, LEVOTHROID) 25 MCG tablet Take 25 mcg by mouth every morning. On an empty stomach 11/26/14  Yes Historical Provider, MD  Omega-3 Fatty Acids (FISH OIL) 500 MG CAPS Take 500 mg by mouth daily.   Yes Historical Provider, MD  triamcinolone cream (KENALOG) 0.1 % Apply 1 application topically daily. 12/02/14  Yes Historical Provider, MD  vitamin C (ASCORBIC ACID) 500 MG tablet Take 500 mg by mouth daily.   Yes Historical Provider, MD    Family History  Problem Relation Age of Onset  . Colon polyps Neg Hx   . Liver disease Neg Hx     History   Social History  . Marital Status: Single    Spouse Name: N/A  . Number of Children: 3  . Years of Education: N/A   Occupational History  . retired    Social History Main Topics  . Smoking status: Never Smoker   . Smokeless tobacco: Not on file  . Alcohol Use: No  . Drug Use: Not on file  . Sexual Activity: Not on file   Other Topics Concern  . Not on file   Social History Narrative   Allergies as of 12/21/2014 - Review Complete 12/21/2014  Allergen Reaction Noted  . Penicillins Anaphylaxis and Swelling 12/21/2014  .  Valium [diazepam] Other (See Comments) 12/21/2014  . Epinephrine Palpitations 12/21/2014   Review of Systems:    All systems reviewed and negative except where noted in HPI.   Physical Exam:  Vital signs in last 24 hours: Temp:  [97.7 F (36.5 C)-98.3 F (36.8 C)] 98.3 F (36.8 C) (06/06 0541) Pulse Rate:  [60-71] 69 (06/06 0541) Resp:  [16-20] 20 (06/06 0541) BP: (141-158)/(58-81) 141/65 mmHg (06/06 0541) SpO2:  [94 %-97 %] 94 % (06/06 0541) Weight:  [83.915 kg (185 lb)-85.548 kg (188 lb 9.6 oz)] 85.276 kg (188 lb) (06/06 0500) Last BM Date: 12/20/14 Body mass index is 30.36 kg/(m^2). General:   Alert,  Well-developed, well-nourished,  pleasant and cooperative in NAD Head:  Normocephalic and atraumatic. Eyes:  Sclera clear, no icterus.   Conjunctiva pink. Ears:  Normal auditory acuity. Nose:  No deformity, discharge, or lesions. Mouth:  No deformity or lesions,oropharynx pink & moist. Neck:  Supple; no masses or thyromegaly. Lungs:  Respirations even and unlabored.  Clear throughout to auscultation.   No wheezes, crackles, or rhonchi. No acute distress. Heart:  Regular rate and rhythm; no murmurs, clicks, rubs, or gallops. Abdomen:  + Faint bowel sounds..  No bruits.  Soft, mildly distended without masses, hepatosplenomegaly or hernias noted.  + Tenderness to palpation of upper mid and left upper quadrant abdomen.  No guarding or rebound tenderness.     Rectal:  Deferred. Msk:  Symmetrical without gross deformities.  Good, equal movement & strength bilaterally. Pulses:  Normal pulses noted. Extremities:  No clubbing or edema.  No cyanosis. Neurologic:  Alert and oriented x3;  grossly normal neurologically. Skin:  Intact without significant lesions or rashes.  No jaundice. Lymph Nodes:  No significant cervical adenopathy. Psych:  Alert and cooperative. Normal mood and affect.  LAB RESULTS:  Recent Labs  12/21/14 1413 12/22/14 0432  WBC 7.8 8.2  HGB 13.7 12.9  HCT 41.5 38.7  PLT 201 169   BMET  Recent Labs  12/21/14 1413 12/22/14 0432  NA 142 137  K 3.7 3.6  CL 107 107  CO2 26 25  GLUCOSE 88 106*  BUN 21* 17  CREATININE 0.79 0.74  CALCIUM 8.8* 8.0*   LFT  Recent Labs  12/21/14 1413 12/22/14 0432  PROT 6.7 6.2*  ALBUMIN 4.1 3.6  AST 29 17  ALT 32 23  ALKPHOS 65 62  BILITOT 0.9 1.4*  BILIDIR 0.2  --   IBILI 0.7  --   LIPASE 2433* 264*   STUDIES: Dg Chest 2 View  12/21/2014   CLINICAL DATA:  Chest pain started today.  EXAM: CHEST  2 VIEW  COMPARISON:  11/28/2013.  FINDINGS: The cardiac silhouette, mediastinal and hilar contours are within normal limits and stable. There is mild tortuosity of  the thoracic aorta. The lungs are clear. No pleural effusion. Mild eventration of the right hemidiaphragm. The bony thorax is intact. Stable surgical changes involving the right shoulder.  IMPRESSION: No acute cardiopulmonary findings.   Electronically Signed   By: Marijo Sanes M.D.   On: 12/21/2014 16:00   Ct Angio Abdomen W/cm &/or Wo Contrast  12/21/2014   CLINICAL DATA:  Chest pain, abdominal pain, and tightness. Shortness of breath since 5 a.m.  EXAM: CT ANGIOGRAPHY CHEST AND ABDOMEN  TECHNIQUE: Multidetector CT imaging of the chest and abdomen was performed using the standard protocol during bolus administration of intravenous contrast. Multiplanar CT image reconstructions and MIPs were obtained to evaluate the vascular anatomy.  CONTRAST:  86mL OMNIPAQUE IOHEXOL 350 MG/ML SOLN  COMPARISON:  CT chest 10/25/2012.  CT abdomen and pelvis 12/25/2012  FINDINGS: CTA CHEST FINDINGS  Mediastinum: No filling defects are noted within the pulmonary arterial tree to suggest pulmonary emboli. Heart size is normal. No pericardial fluid, thickening or calcification. No acute abnormality of the thoracic aorta or other great vessels of the mediastinum. LEFT main and LEFT anterior descending coronary artery calcification. No pathologically enlarged mediastinal or hilar lymph nodes. Small hiatal hernia.  Lungs/Pleura: No consolidative airspace disease. No pleural effusions. No suspicious appearing pulmonary nodules or masses.  Musculoskeletal: No aggressive appearing lytic or blastic lesions are noted in the visualized portions of the skeleton. Subpectoral breast implants.  Review of the MIP images confirms the above findings.  CTA ABDOMEN FINDINGS  There is no evidence for aortic dissection minor atheromatous change at the visceral and renal branch origins. Both iliac arteries widely patent.  Extensive layering gallstones without significant distention or signs of acute cholecystitis. Stable appearing hepatic cystic lesions  incompletely evaluated. No renal obstruction. No splenomegaly. No bowel obstruction. Degenerative changes in the spine appears stable. Diverticular disease of the colon incompletely evaluated.  Review of the MIP images confirms the above findings.  IMPRESSION: No evidence for aortic dissection.  No acute findings in the chest or abdomen.  Cholelithiasis without signs of acute cholecystitis.   Electronically Signed   By: Rolla Flatten M.D.   On: 12/21/2014 19:26   Ct Angio Chest Aorta W/cm &/or Wo/cm  12/21/2014   CLINICAL DATA:  Chest pain, abdominal pain, and tightness. Shortness of breath since 5 a.m.  EXAM: CT ANGIOGRAPHY CHEST AND ABDOMEN  TECHNIQUE: Multidetector CT imaging of the chest and abdomen was performed using the standard protocol during bolus administration of intravenous contrast. Multiplanar CT image reconstructions and MIPs were obtained to evaluate the vascular anatomy.  CONTRAST:  63mL OMNIPAQUE IOHEXOL 350 MG/ML SOLN  COMPARISON:  CT chest 10/25/2012.  CT abdomen and pelvis 12/25/2012  FINDINGS: CTA CHEST FINDINGS  Mediastinum: No filling defects are noted within the pulmonary arterial tree to suggest pulmonary emboli. Heart size is normal. No pericardial fluid, thickening or calcification. No acute abnormality of the thoracic aorta or other great vessels of the mediastinum. LEFT main and LEFT anterior descending coronary artery calcification. No pathologically enlarged mediastinal or hilar lymph nodes. Small hiatal hernia.  Lungs/Pleura: No consolidative airspace disease. No pleural effusions. No suspicious appearing pulmonary nodules or masses.  Musculoskeletal: No aggressive appearing lytic or blastic lesions are noted in the visualized portions of the skeleton. Subpectoral breast implants.  Review of the MIP images confirms the above findings.  CTA ABDOMEN FINDINGS  There is no evidence for aortic dissection minor atheromatous change at the visceral and renal branch origins. Both iliac  arteries widely patent.  Extensive layering gallstones without significant distention or signs of acute cholecystitis. Stable appearing hepatic cystic lesions incompletely evaluated. No renal obstruction. No splenomegaly. No bowel obstruction. Degenerative changes in the spine appears stable. Diverticular disease of the colon incompletely evaluated.  Review of the MIP images confirms the above findings.  IMPRESSION: No evidence for aortic dissection.  No acute findings in the chest or abdomen.  Cholelithiasis without signs of acute cholecystitis.   Electronically Signed   By: Rolla Flatten M.D.   On: 12/21/2014 19:26     Impression / Plan:   Elyn Krogh is a 74 y.o. y/o female with admitted with her first episode of acute pancreatitis. She does have cholelithiasis. Bilirubin  is mildly elevated and she may have passed choledocholithiasis or sludge. Abdominal ultrasound to evaluate further. Continue supportive measures including IV fluids, pain control, and anti-medics.   Thank you for involving me in the care of this patient.  The care of Asenath Balash will be discussed in direct collaboration with Dr Lucilla Lame, Attending Gastroenterologist.   LOS: 1 day  Vickey Huger, NP  12/22/2014, 11:56 AM [facility]  [place] [place], [place] [address] Phone: [phone] Fax : [phone]

## 2014-12-22 NOTE — Plan of Care (Signed)
Problem: Discharge Progression Outcomes Goal: Other Discharge Outcomes/Goals Pt had [name] of abd, complains of epigastric pain, prn pain meds given with relief. Complained of nausea which was relieved after pt vomitted thin yellow emesis. Pt stated relief with nausea med She is x1 assist to BR.  Will continue to  Monitor.

## 2014-12-22 NOTE — Care Management (Signed)
Admitted to [facility] with the diagnosis of acute pancreatitis. Lives alone. Daughter is [facility] [place] [phone]). Sees Dr. Tonita Phoenix in [place]. Last seen 2-3 weeks ago. Home Health following knee surgery in 2012. Doesn't remember name of agency. [facility] in [place] several years ago for rehabilitation. Uses no aids for ambulation. Takes care of all basic activities of herself. Daughter or neighbor will transport.  NPO. Lipase 264. IV Levaquin continues. Shelbie Ammons RN MSN Care Management [phone]

## 2014-12-23 ENCOUNTER — Telehealth: Payer: Self-pay | Admitting: Urgent Care

## 2014-12-23 DIAGNOSIS — K859 Acute pancreatitis, unspecified: Secondary | ICD-10-CM

## 2014-12-23 LAB — HEPATIC FUNCTION PANEL
ALT: 19 U/L (ref 14–54)
AST: 18 U/L (ref 15–41)
Albumin: 3.6 g/dL (ref 3.5–5.0)
Alkaline Phosphatase: 60 U/L (ref 38–126)
Bilirubin, Direct: 0.2 mg/dL (ref 0.1–0.5)
Indirect Bilirubin: 0.9 mg/dL (ref 0.3–0.9)
Total Bilirubin: 1.1 mg/dL (ref 0.3–1.2)
Total Protein: 6.3 g/dL — ABNORMAL LOW (ref 6.5–8.1)

## 2014-12-23 LAB — LIPASE, BLOOD: Lipase: 49 U/L (ref 22–51)

## 2014-12-23 MED ORDER — FAMOTIDINE 20 MG PO TABS: 20.0000 mg | ORAL_TABLET | Freq: Two times a day (BID) | ORAL | Status: DC

## 2014-12-23 NOTE — Progress Notes (Signed)
Subjective: Pt denies any pain this AM.  She did have vomiting yesterday.  Objective: Vital signs in last 24 hours: Temp:  [97.8 F (36.6 C)-98.2 F (36.8 C)] 97.8 F (36.6 C) (06/07 0549) Pulse Rate:  [74-78] 78 (06/06 2116) Resp:  [18-20] 20 (06/07 0549) BP: (131-151)/(59-72) 131/59 mmHg (06/07 0549) SpO2:  [93 %-96 %] 93 % (06/06 2116) Weight:  [87.816 kg (193 lb 9.6 oz)] 87.816 kg (193 lb 9.6 oz) (06/07 0500) Last BM Date: 12/20/14 No LMP recorded. Patient is postmenopausal. Body mass index is 31.26 kg/(m^2). General:   Alert,  Well-developed, well-nourished, pleasant and cooperative in NAD Head:  Normocephalic and atraumatic. Eyes:  Sclera clear, no icterus.   Conjunctiva pink. Mouth:  No deformity or lesions, oropharynx pink & moist. Neck:  Supple; no masses or thyromegaly. Heart:  Regular rate and rhythm; no murmurs, clicks, rubs, or gallops. Abdomen:   Normal bowel sounds.  Soft, nontender and nondistended. No masses, hepatosplenomegaly or hernias noted.  No guarding or rebound tenderness.   Msk:  Symmetrical without gross deformities. Good equal movement & strength bilaterally. Pulses:  Normal pulses noted. Extremities:  Without clubbing or edema.  No cyanosis Neurologic:  Alert and  oriented x3;  grossly normal neurologically. Skin:  Intact without significant lesions or rashes. Cervical Nodes:  No significant cervical adenopathy. Psych:  Alert and cooperative. Normal mood and affect.  Intake/Output from previous day: 06/06 0701 - 06/07 0700 In: -  Out: 1100 [Urine:1100]  Lab Results:  Recent Labs  12/21/14 1413 12/22/14 0432  WBC 7.8 8.2  HGB 13.7 12.9  HCT 41.5 38.7  PLT 201 169   BMET  Recent Labs  12/21/14 1413 12/22/14 0432  NA 142 137  K 3.7 3.6  CL 107 107  CO2 26 25  GLUCOSE 88 106*  BUN 21* 17  CREATININE 0.79 0.74  CALCIUM 8.8* 8.0*   LFT  Recent Labs  12/21/14 1413 12/22/14 0432 12/23/14 0555  PROT 6.7 6.2* 6.3*  ALBUMIN  4.1 3.6 3.6  AST 29 17 18   ALT 32 23 19  ALKPHOS 65 62 60  BILITOT 0.9 1.4* 1.1  BILIDIR 0.2  --  0.2  IBILI 0.7  --  0.9  LIPASE 2433* 264* 49   PT/INR No results for input(s): LABPROT, INR in the last 72 hours. Hepatitis Panel No results for input(s): HEPBSAG, HCVAB, HEPAIGM, HEPBIGM in the last 72 hours. C-Diff PCR No results for input(s): CDIFFTOX in the last 72 hours.  Studies/Results: Dg Chest 2 View  12/21/2014   CLINICAL DATA:  Chest pain started today.  EXAM: CHEST  2 VIEW  COMPARISON:  11/28/2013.  FINDINGS: The cardiac silhouette, mediastinal and hilar contours are within normal limits and stable. There is mild tortuosity of the thoracic aorta. The lungs are clear. No pleural effusion. Mild eventration of the right hemidiaphragm. The bony thorax is intact. Stable surgical changes involving the right shoulder.  IMPRESSION: No acute cardiopulmonary findings.   Electronically Signed   By: Marijo Sanes M.D.   On: 12/21/2014 16:00   Ct Angio Abdomen W/cm &/or Wo Contrast  12/21/2014   CLINICAL DATA:  Chest pain, abdominal pain, and tightness. Shortness of breath since 5 a.m.  EXAM: CT ANGIOGRAPHY CHEST AND ABDOMEN  TECHNIQUE: Multidetector CT imaging of the chest and abdomen was performed using the standard protocol during bolus administration of intravenous contrast. Multiplanar CT image reconstructions and MIPs were obtained to evaluate the vascular anatomy.  CONTRAST:  52mL OMNIPAQUE  IOHEXOL 350 MG/ML SOLN  COMPARISON:  CT chest 10/25/2012.  CT abdomen and pelvis 12/25/2012  FINDINGS: CTA CHEST FINDINGS  Mediastinum: No filling defects are noted within the pulmonary arterial tree to suggest pulmonary emboli. Heart size is normal. No pericardial fluid, thickening or calcification. No acute abnormality of the thoracic aorta or other great vessels of the mediastinum. LEFT main and LEFT anterior descending coronary artery calcification. No pathologically enlarged mediastinal or hilar lymph  nodes. Small hiatal hernia.  Lungs/Pleura: No consolidative airspace disease. No pleural effusions. No suspicious appearing pulmonary nodules or masses.  Musculoskeletal: No aggressive appearing lytic or blastic lesions are noted in the visualized portions of the skeleton. Subpectoral breast implants.  Review of the MIP images confirms the above findings.  CTA ABDOMEN FINDINGS  There is no evidence for aortic dissection minor atheromatous change at the visceral and renal branch origins. Both iliac arteries widely patent.  Extensive layering gallstones without significant distention or signs of acute cholecystitis. Stable appearing hepatic cystic lesions incompletely evaluated. No renal obstruction. No splenomegaly. No bowel obstruction. Degenerative changes in the spine appears stable. Diverticular disease of the colon incompletely evaluated.  Review of the MIP images confirms the above findings.  IMPRESSION: No evidence for aortic dissection.  No acute findings in the chest or abdomen.  Cholelithiasis without signs of acute cholecystitis.   Electronically Signed   By: Rolla Flatten M.D.   On: 12/21/2014 19:26   Ct Angio Chest Aorta W/cm &/or Wo/cm  12/21/2014   CLINICAL DATA:  Chest pain, abdominal pain, and tightness. Shortness of breath since 5 a.m.  EXAM: CT ANGIOGRAPHY CHEST AND ABDOMEN  TECHNIQUE: Multidetector CT imaging of the chest and abdomen was performed using the standard protocol during bolus administration of intravenous contrast. Multiplanar CT image reconstructions and MIPs were obtained to evaluate the vascular anatomy.  CONTRAST:  21mL OMNIPAQUE IOHEXOL 350 MG/ML SOLN  COMPARISON:  CT chest 10/25/2012.  CT abdomen and pelvis 12/25/2012  FINDINGS: CTA CHEST FINDINGS  Mediastinum: No filling defects are noted within the pulmonary arterial tree to suggest pulmonary emboli. Heart size is normal. No pericardial fluid, thickening or calcification. No acute abnormality of the thoracic aorta or other  great vessels of the mediastinum. LEFT main and LEFT anterior descending coronary artery calcification. No pathologically enlarged mediastinal or hilar lymph nodes. Small hiatal hernia.  Lungs/Pleura: No consolidative airspace disease. No pleural effusions. No suspicious appearing pulmonary nodules or masses.  Musculoskeletal: No aggressive appearing lytic or blastic lesions are noted in the visualized portions of the skeleton. Subpectoral breast implants.  Review of the MIP images confirms the above findings.  CTA ABDOMEN FINDINGS  There is no evidence for aortic dissection minor atheromatous change at the visceral and renal branch origins. Both iliac arteries widely patent.  Extensive layering gallstones without significant distention or signs of acute cholecystitis. Stable appearing hepatic cystic lesions incompletely evaluated. No renal obstruction. No splenomegaly. No bowel obstruction. Degenerative changes in the spine appears stable. Diverticular disease of the colon incompletely evaluated.  Review of the MIP images confirms the above findings.  IMPRESSION: No evidence for aortic dissection.  No acute findings in the chest or abdomen.  Cholelithiasis without signs of acute cholecystitis.   Electronically Signed   By: Rolla Flatten M.D.   On: 12/21/2014 19:26   US Abdomen Limited Ruq  12/22/2014   CLINICAL DATA:  Pancreatitis, known gallstones; epigastric and right upper quadrant pain for the past 3 days with 1 day of nausea and  vomiting  EXAM: US ABDOMEN LIMITED - RIGHT UPPER QUADRANT  COMPARISON:  CT scan of the abdomen and pelvis of December 21, 2014  FINDINGS: Gallbladder:  The gallbladder is adequately distended. There are multiple echogenic mobile shadowing stones. The largest measures 1.6 cm. The gallbladder wall is normal in thickness. There is no positive sonographic Murphy's sign.  Common bile duct:  Diameter: 3.8 mm  Liver:  The liver was difficult to penetrate with the ultrasound beam. The echotexture  appears mildly increased.  IMPRESSION: 1. Multiple gallstones without evidence of acute cholecystitis. 2. Fatty infiltrative change of the liver.   Electronically Signed   By: David  Martinique M.D.   On: 12/22/2014 17:18    Assessment: #1  Acute Pancreatitis without complications:  1st episode.  Most likely she passed cholelithiasis as her LFTS & lipase are normal now.  CBD normal.  No ETOH or hypertriglyceridemia. #2  Fatty Liver:  Normal LFTS  Plan: #1  Outpatient cholecystectomy.  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 #2  Follow up with [name] in 2-3 weeks #3  Advance diet as tolerated   The care of [name] will be discussed in direct collaboration with Dr Lucilla Lame, Attending Gastroenterologist.  LOS: 2 days  Vickey Huger  12/23/2014, 7:36 AM [facility]  [place], [place] [address] Phone: [phone] Fax : [phone]

## 2014-12-23 NOTE — Progress Notes (Signed)
No c/o pain at this time. No c/o nausea, no vomiting.  Ambulates with one assist with walker

## 2014-12-23 NOTE — Progress Notes (Signed)
CONCERNING: IV to Oral Route Change Policy  RECOMMENDATION: This patient is receiving famotidine by the intravenous route.  Based on criteria approved by the Pharmacy and Therapeutics Committee, the intravenous medication(s) is/are being converted to the equivalent oral dose form(s).  DESCRIPTION: These criteria include:  The patient is eating (either orally or via tube) and/or has been taking other orally administered medications for a least 24 hours  The patient has no evidence of active gastrointestinal bleeding or impaired GI absorption (gastrectomy, short bowel, patient on TNA or NPO).  If you have questions about this conversion, please contact the Pharmacy Department  []   [phone] )  [facility] [x]   [phone] )  [facility] []   [phone] )  [facility] []   [phone] )  [facility] []   [phone] )  [place] 12/23/2014 10:35 AM

## 2014-12-23 NOTE — Discharge Instructions (Signed)

## 2014-12-23 NOTE — Discharge Summary (Signed)
[facility] [facility] [place] NAME: [name]    MR#:  [id number]  DATE OF BIRTH:  08/19/1940  DATE OF ADMISSION:  12/21/2014 ADMITTING PHYSICIAN: Idelle Crouch, MD  DATE OF DISCHARGE: 12/23/2014  1:00 PM  PRIMARY CARE PHYSICIAN: No PCP Per Patient    ADMISSION DIAGNOSIS:  Lower abdominal pain [R10.30] Chest pain [R07.9] Acute pancreatitis, unspecified pancreatitis type [K85.9]  DISCHARGE DIAGNOSIS:  Principal Problem:   Acute pancreatitis Active Problems:   Gallstones   Abdominal pain   SECONDARY DIAGNOSIS:   Past Medical History  Diagnosis Date  . Acid reflux   . Thyroid disease   . Melanoma 2013    HOSPITAL COURSE:   74 year old female with past medical history of GERD, hypothyroidism, history of malignant melanoma, who presented to the hospital due to vague chest and abdominal pain and noted to have elevated lipase consistent with acute pancreatitis.  #1 acute pancreatitis-this is likely the cause of patient's abdominal pain and nausea vomiting. Patient had lipase greater than 2000. -Patient had no history of alcohol abuse, high LFTs are stable although her abdominal ultrasound did show cholelithiasis without evidence of acute cholecystitis. Her lipid profile is within range. - Patient was treated with supportive care with IV fluids, antiemetics, pain control. Her clinical symptoms since admission is improved her diet has been advanced from clear liquid eventually to a low-fat which she is not tolerating without any further abdominal pain nausea vomiting. Her lipase has normalized. -Patient was seen by gastroenterology and thought to most likely cause of the pancreatitis was possibly due to cholelithiasis. She likely needs a cholecystectomy but can be done as an outpatient and patient prefers to go down to a Psychologist, sport and exercise in [place].  #2 hypothyroidism patient was maintained on her Synthroid she will resume that.  #3  GERD-patient was maintained on Protonix but will resume her Nexium upon discharge.  DISCHARGE CONDITIONS:   Stable  CONSULTS OBTAINED:  Treatment Team:  Lucilla Lame, MD  DRUG ALLERGIES:   Allergies  Allergen Reactions  . Penicillins Anaphylaxis and Swelling  . Valium [Diazepam] Other (See Comments)    Patient states makes it hard for her to wake up.  Marland Kitchen Epinephrine Palpitations    DISCHARGE MEDICATIONS:   Discharge Medication List as of 12/23/2014 11:25 AM    CONTINUE these medications which have NOT CHANGED   Details  albuterol (PROVENTIL HFA;VENTOLIN HFA) 108 (90 BASE) MCG/ACT inhaler Inhale 1-2 puffs into the lungs every 6 (six) hours as needed for wheezing or shortness of breath., Until Discontinued, Historical Med    aspirin EC 81 MG tablet Take 81 mg by mouth daily., Until Discontinued, Historical Med    calcium-vitamin D (OSCAL WITH D) 500-200 MG-UNIT per tablet Take 1 tablet by mouth daily., Until Discontinued, Historical Med    Cholecalciferol (VITAMIN D3) 2000 UNITS capsule Take 2,000 Units by mouth daily., Until Discontinued, Historical Med    clotrimazole (LOTRIMIN) 1 % cream Apply 1 application topically at bedtime., Starting 12/02/2014, Until Discontinued, Historical Med    esomeprazole (NEXIUM) 20 MG capsule Take 20 mg by mouth daily at 12 noon., Until Discontinued, Historical Med    ibuprofen (ADVIL,MOTRIN) 800 MG tablet Take 800 mg by mouth every 8 (eight) hours as needed., Starting 12/18/2014, Until Discontinued, Historical Med    levothyroxine (SYNTHROID, LEVOTHROID) 25 MCG tablet Take 25 mcg by mouth every morning. On an empty stomach, Starting 11/26/2014, Until Discontinued, Historical Med    Omega-3 Fatty  Acids (FISH OIL) 500 MG CAPS Take 500 mg by mouth daily., Until Discontinued, Historical Med    triamcinolone cream (KENALOG) 0.1 % Apply 1 application topically daily., Starting 12/02/2014, Until Discontinued, Historical Med    vitamin C (ASCORBIC ACID)  500 MG tablet Take 500 mg by mouth daily., Until Discontinued, Historical Med         DISCHARGE INSTRUCTIONS:   DIET:  Cardiac diet  DISCHARGE CONDITION:  Stable  ACTIVITY:  Activity as tolerated  OXYGEN:  Home Oxygen: Yes.     Oxygen Delivery: room air  DISCHARGE LOCATION:  home   If you experience worsening of your admission symptoms, develop shortness of breath, life threatening emergency, suicidal or homicidal thoughts you must seek medical attention immediately by calling 911 or calling your MD immediately  if symptoms less severe.  You Must read complete instructions/literature along with all the possible adverse reactions/side effects for all the Medicines you take and that have been prescribed to you. Take any new Medicines after you have completely understood and accpet all the possible adverse reactions/side effects.   Please note  You were cared for by a hospitalist during your hospital stay. If you have any questions about your discharge medications or the care you received while you were in the hospital after you are discharged, you can call the unit and asked to speak with the hospitalist on call if the hospitalist that took care of you is not available. Once you are discharged, your primary care physician will handle any further medical issues. Please note that NO REFILLS for any discharge medications will be authorized once you are discharged, as it is imperative that you return to your primary care physician (or establish a relationship with a primary care physician if you do not have one) for your aftercare needs so that they can reassess your need for medications and monitor your lab values.     Today    VITAL SIGNS:  Blood pressure 131/59, pulse 78, temperature 97.8 F (36.6 C), temperature source Oral, resp. rate 20, height 5\' 6"  (1.676 m), weight 87.816 kg (193 lb 9.6 oz), SpO2 93 %.  I/O:   Intake/Output Summary (Last 24 hours) at 12/23/14  1636 Last data filed at 12/23/14 1317  Gross per 24 hour  Intake   1230 ml  Output   1300 ml  Net    -70 ml    PHYSICAL EXAMINATION:  GENERAL:  74 y.o.-year-old patient lying in the bed with no acute distress.  EYES: Pupils equal, round, reactive to light and accommodation. No scleral icterus. Extraocular muscles intact.  HEENT: Head atraumatic, normocephalic. Oropharynx and nasopharynx clear.  NECK:  Supple, no jugular venous distention. No thyroid enlargement, no tenderness.  LUNGS: Normal breath sounds bilaterally, no wheezing, rales,rhonchi or crepitation. No use of accessory muscles of respiration.  CARDIOVASCULAR: S1, S2 normal. No murmurs, rubs, or gallops.  ABDOMEN: Soft, non-tender, non-distended. Bowel sounds present. No organomegaly or mass.  EXTREMITIES: No pedal edema, cyanosis, or clubbing.  NEUROLOGIC: Cranial nerves II through XII are intact. No focal motor or sensory defecits b/l.  PSYCHIATRIC: The patient is alert and oriented x 3.  SKIN: No obvious rash, lesion, or ulcer.   DATA REVIEW:   CBC  Recent Labs Lab 12/22/14 0432  WBC 8.2  HGB 12.9  HCT 38.7  PLT 169    Chemistries   Recent Labs Lab 12/22/14 0432 12/23/14 0555  NA 137  --   K 3.6  --  CL 107  --   CO2 25  --   GLUCOSE 106*  --   BUN 17  --   CREATININE 0.74  --   CALCIUM 8.0*  --   AST 17 18  ALT 23 19  ALKPHOS 62 60  BILITOT 1.4* 1.1    Cardiac Enzymes  Recent Labs Lab 12/21/14 1413  TROPONINI <0.03    Microbiology Results  No results found for this or any previous visit.  RADIOLOGY:  Ct Angio Abdomen W/cm &/or Wo Contrast  12/21/2014   CLINICAL DATA:  Chest pain, abdominal pain, and tightness. Shortness of breath since 5 a.m.  EXAM: CT ANGIOGRAPHY CHEST AND ABDOMEN  TECHNIQUE: Multidetector CT imaging of the chest and abdomen was performed using the standard protocol during bolus administration of intravenous contrast. Multiplanar CT image reconstructions and MIPs were  obtained to evaluate the vascular anatomy.  CONTRAST:  67mL OMNIPAQUE IOHEXOL 350 MG/ML SOLN  COMPARISON:  CT chest 10/25/2012.  CT abdomen and pelvis 12/25/2012  FINDINGS: CTA CHEST FINDINGS  Mediastinum: No filling defects are noted within the pulmonary arterial tree to suggest pulmonary emboli. Heart size is normal. No pericardial fluid, thickening or calcification. No acute abnormality of the thoracic aorta or other great vessels of the mediastinum. LEFT main and LEFT anterior descending coronary artery calcification. No pathologically enlarged mediastinal or hilar lymph nodes. Small hiatal hernia.  Lungs/Pleura: No consolidative airspace disease. No pleural effusions. No suspicious appearing pulmonary nodules or masses.  Musculoskeletal: No aggressive appearing lytic or blastic lesions are noted in the visualized portions of the skeleton. Subpectoral breast implants.  Review of the MIP images confirms the above findings.  CTA ABDOMEN FINDINGS  There is no evidence for aortic dissection minor atheromatous change at the visceral and renal branch origins. Both iliac arteries widely patent.  Extensive layering gallstones without significant distention or signs of acute cholecystitis. Stable appearing hepatic cystic lesions incompletely evaluated. No renal obstruction. No splenomegaly. No bowel obstruction. Degenerative changes in the spine appears stable. Diverticular disease of the colon incompletely evaluated.  Review of the MIP images confirms the above findings.  IMPRESSION: No evidence for aortic dissection.  No acute findings in the chest or abdomen.  Cholelithiasis without signs of acute cholecystitis.   Electronically Signed   By: Rolla Flatten M.D.   On: 12/21/2014 19:26   Ct Angio Chest Aorta W/cm &/or Wo/cm  12/21/2014   CLINICAL DATA:  Chest pain, abdominal pain, and tightness. Shortness of breath since 5 a.m.  EXAM: CT ANGIOGRAPHY CHEST AND ABDOMEN  TECHNIQUE: Multidetector CT imaging of the chest and  abdomen was performed using the standard protocol during bolus administration of intravenous contrast. Multiplanar CT image reconstructions and MIPs were obtained to evaluate the vascular anatomy.  CONTRAST:  12mL OMNIPAQUE IOHEXOL 350 MG/ML SOLN  COMPARISON:  CT chest 10/25/2012.  CT abdomen and pelvis 12/25/2012  FINDINGS: CTA CHEST FINDINGS  Mediastinum: No filling defects are noted within the pulmonary arterial tree to suggest pulmonary emboli. Heart size is normal. No pericardial fluid, thickening or calcification. No acute abnormality of the thoracic aorta or other great vessels of the mediastinum. LEFT main and LEFT anterior descending coronary artery calcification. No pathologically enlarged mediastinal or hilar lymph nodes. Small hiatal hernia.  Lungs/Pleura: No consolidative airspace disease. No pleural effusions. No suspicious appearing pulmonary nodules or masses.  Musculoskeletal: No aggressive appearing lytic or blastic lesions are noted in the visualized portions of the skeleton. Subpectoral breast implants.  Review of the  MIP images confirms the above findings.  CTA ABDOMEN FINDINGS  There is no evidence for aortic dissection minor atheromatous change at the visceral and renal branch origins. Both iliac arteries widely patent.  Extensive layering gallstones without significant distention or signs of acute cholecystitis. Stable appearing hepatic cystic lesions incompletely evaluated. No renal obstruction. No splenomegaly. No bowel obstruction. Degenerative changes in the spine appears stable. Diverticular disease of the colon incompletely evaluated.  Review of the MIP images confirms the above findings.  IMPRESSION: No evidence for aortic dissection.  No acute findings in the chest or abdomen.  Cholelithiasis without signs of acute cholecystitis.   Electronically Signed   By: Rolla Flatten M.D.   On: 12/21/2014 19:26   US Abdomen Limited Ruq  12/22/2014   CLINICAL DATA:  Pancreatitis, known  gallstones; epigastric and right upper quadrant pain for the past 3 days with 1 day of nausea and vomiting  EXAM: US ABDOMEN LIMITED - RIGHT UPPER QUADRANT  COMPARISON:  CT scan of the abdomen and pelvis of December 21, 2014  FINDINGS: Gallbladder:  The gallbladder is adequately distended. There are multiple echogenic mobile shadowing stones. The largest measures 1.6 cm. The gallbladder wall is normal in thickness. There is no positive sonographic Murphy's sign.  Common bile duct:  Diameter: 3.8 mm  Liver:  The liver was difficult to penetrate with the ultrasound beam. The echotexture appears mildly increased.  IMPRESSION: 1. Multiple gallstones without evidence of acute cholecystitis. 2. Fatty infiltrative change of the liver.   Electronically Signed   By: David  Martinique M.D.   On: 12/22/2014 17:18      Management plans discussed with the patient, family and they are in agreement.  CODE STATUS:     Code Status Orders        Start     Ordered   12/21/14 2011  Full code   Continuous     12/21/14 2011    Advance Directive Documentation        Most Recent Value   Type of Advance Directive  Healthcare Power of Attorney, Living will   Pre-existing out of facility DNR order (yellow form or pink MOST form)     "MOST" Form in Place?        TOTAL TIME TAKING CARE OF THIS PATIENT: 40 minutes.    Henreitta Leber M.D on 12/23/2014 at 4:36 PM  Between 7am to 6pm - Pager - [phone]  After 6pm go to [email or web address] - password [email or web address] [facility]  [facility]  Office  [phone]  CC: Primary care physician; No PCP Per Patient

## 2014-12-23 NOTE — Plan of Care (Signed)
Problem: Discharge Progression Outcomes Goal: Discharge plan in place and appropriate Individualization Outcome: Progressing Individualization: Pt goes by [name], lives at home alone, has  A hx of thryroid disease, melanoma in 2013, and acid reflux. She does take home medications which are stored in the pharmacy.    Goal: Other Discharge Outcomes/Goals Outcome: Progressing Pt up to br x1 assist. She complains of abdominal and back pain, pt offered pain medication on multiple occassions but refused, finally took x1 dose. Dry heaving episodes x2. Zopran given x2 with relief. Will continue to monitor.

## 2014-12-23 NOTE — Telephone Encounter (Signed)
Needs 2 week FU pancreatitis

## 2014-12-25 NOTE — Telephone Encounter (Signed)
great

## 2014-12-26 ENCOUNTER — Telehealth: Payer: Self-pay | Admitting: Urgent Care

## 2014-12-26 DIAGNOSIS — K851 Biliary acute pancreatitis without necrosis or infection: Secondary | ICD-10-CM

## 2014-12-26 DIAGNOSIS — K8021 Calculus of gallbladder without cholecystitis with obstruction: Secondary | ICD-10-CM

## 2014-12-26 NOTE — Telephone Encounter (Signed)
[name] consulted with patient when she was in the hospital earlier this week. Patient was told she needed to schedule an appointment with a surgeon within the next 2 weeks. Patient called Dr Marlou Starks with [name] to schedule appointment and was told they could see her June 28th, unless we send a referral to them at which time they can make an appointment within the next 2 weeks. Please send referral to Dr Marlou Starks at [facility] - phone # [phone] - and they will make an appointment for patient to be seen in the next 2 weeks. Thanks.

## 2014-12-30 ENCOUNTER — Other Ambulatory Visit: Payer: Self-pay | Admitting: General Surgery

## 2015-01-21 ENCOUNTER — Other Ambulatory Visit: Payer: Self-pay | Admitting: *Deleted

## 2015-01-21 ENCOUNTER — Encounter (HOSPITAL_COMMUNITY): Payer: Self-pay

## 2015-01-21 ENCOUNTER — Other Ambulatory Visit: Payer: Self-pay | Admitting: General Surgery

## 2015-01-21 ENCOUNTER — Encounter (HOSPITAL_COMMUNITY)
Admission: RE | Admit: 2015-01-21 | Discharge: 2015-01-21 | Disposition: A | Discharge status: Home / Self Care | Payer: Medicare Other | Source: Ambulatory Visit | Attending: General Surgery | Admitting: General Surgery

## 2015-01-21 DIAGNOSIS — K8080 Other cholelithiasis without obstruction: Secondary | ICD-10-CM | POA: Diagnosis not present

## 2015-01-21 DIAGNOSIS — Z01810 Encounter for preprocedural cardiovascular examination: Secondary | ICD-10-CM | POA: Diagnosis not present

## 2015-01-21 DIAGNOSIS — I10 Essential (primary) hypertension: Secondary | ICD-10-CM | POA: Diagnosis not present

## 2015-01-21 DIAGNOSIS — Z01812 Encounter for preprocedural laboratory examination: Secondary | ICD-10-CM | POA: Diagnosis present

## 2015-01-21 LAB — COMPREHENSIVE METABOLIC PANEL
ALT: 19 U/L (ref 14–54)
AST: 19 U/L (ref 15–41)
Albumin: 3.8 g/dL (ref 3.5–5.0)
Alkaline Phosphatase: 69 U/L (ref 38–126)
Anion gap: 9 (ref 5–15)
BUN: 16 mg/dL (ref 6–20)
CO2: 23 mmol/L (ref 22–32)
Calcium: 8.7 mg/dL — ABNORMAL LOW (ref 8.9–10.3)
Chloride: 107 mmol/L (ref 101–111)
Creatinine, Ser: 0.84 mg/dL (ref 0.44–1.00)
GFR calc Af Amer: >60 mL/min (ref >60)
GFR calc non Af Amer: >60 mL/min (ref >60)
Glucose, Bld: 119 mg/dL — ABNORMAL HIGH (ref 65–99)
Potassium: 3.7 mmol/L (ref 3.5–5.1)
Sodium: 139 mmol/L (ref 135–145)
Total Bilirubin: 0.9 mg/dL (ref 0.3–1.2)
Total Protein: 6.5 g/dL (ref 6.5–8.1)

## 2015-01-21 LAB — CBC
HCT: 42 % (ref 36.0–46.0)
Hemoglobin: 13.8 g/dL (ref 12.0–15.0)
MCH: 29.5 pg (ref 26.0–34.0)
MCHC: 32.9 g/dL (ref 30.0–36.0)
MCV: 89.7 fL (ref 78.0–100.0)
Platelets: 200 10*3/uL (ref 150–400)
RBC: 4.68 MIL/uL (ref 3.87–5.11)
RDW: 13.5 % (ref 11.5–15.5)
WBC: 5.2 10*3/uL (ref 4.0–10.5)

## 2015-01-21 MED ORDER — CHLORHEXIDINE GLUCONATE 4 % EX LIQD: 1.0000 "application " | Freq: Once | CUTANEOUS | Status: DC

## 2015-01-21 NOTE — Pre-Procedure Instructions (Addendum)
    [name]  01/21/2015     [facility]  [address]  [place], [place]   Your procedure is scheduled on January 28, 2015.  Report to [facility] Admitting at 9:55 A.M.  Call this number if you have problems the morning of surgery:  [phone]   Remember:  Do not eat food or drink liquids after midnight.  Take these medicines the morning of surgery with A SIP OF WATER : esomeprazole (NEXIUM), levothyroxine (SYNTHROID, LEVOTHROID)   STOP ASPIRIN, ADVIL, IBUPROFEN, FISH OIL, HERBAL MEDICATIONS ONE WEEK PRIOR TO SURGERY   Do not wear jewelry, make-up or nail polish.  Do not wear lotions, powders, or perfumes.  You may wear deodorant.  Do not shave 48 hours prior to surgery.    Do not bring valuables to the hospital.  [facility] is not responsible for any belongings or valuables.  Contacts, dentures or bridgework may not be worn into surgery.  Leave your suitcase in the car.  After surgery it may be brought to your room.  For patients admitted to the hospital, discharge time will be determined by your treatment team.  Patients discharged the day of surgery will not be allowed to drive home.   Name and phone number of your driver:    Special instructions:  "PREPARING FOR SURGERY"  Please read over the following fact sheets that you were given. Pain Booklet, Coughing and Deep Breathing and Surgical Site Infection Prevention

## 2015-01-27 MED ORDER — CIPROFLOXACIN IN D5W 400 MG/200ML IV SOLN
400.0000 mg | INTRAVENOUS | Status: AC
  Administered 2015-01-28: 400 mg via INTRAVENOUS
  Filled 2015-01-27: qty 200

## 2015-01-28 ENCOUNTER — Ambulatory Visit (HOSPITAL_COMMUNITY): Payer: Medicare Other

## 2015-01-28 ENCOUNTER — Ambulatory Visit (HOSPITAL_COMMUNITY)
Admission: RE | Admit: 2015-01-28 | Discharge: 2015-01-29 | Disposition: A | Discharge status: Home / Self Care | Payer: Medicare Other | Source: Ambulatory Visit | Attending: General Surgery | Admitting: General Surgery

## 2015-01-28 ENCOUNTER — Encounter (HOSPITAL_COMMUNITY)
Admission: RE | Disposition: A | Discharge status: Home / Self Care | Payer: Self-pay | Source: Ambulatory Visit | Attending: General Surgery

## 2015-01-28 ENCOUNTER — Encounter (HOSPITAL_COMMUNITY): Payer: Self-pay | Admitting: *Deleted

## 2015-01-28 ENCOUNTER — Ambulatory Visit (HOSPITAL_COMMUNITY): Payer: Medicare Other | Admitting: Certified Registered Nurse Anesthetist

## 2015-01-28 DIAGNOSIS — Z884 Allergy status to anesthetic agent status: Secondary | ICD-10-CM | POA: Insufficient documentation

## 2015-01-28 DIAGNOSIS — E039 Hypothyroidism, unspecified: Secondary | ICD-10-CM | POA: Insufficient documentation

## 2015-01-28 DIAGNOSIS — Z7982 Long term (current) use of aspirin: Secondary | ICD-10-CM | POA: Insufficient documentation

## 2015-01-28 DIAGNOSIS — G709 Myoneural disorder, unspecified: Secondary | ICD-10-CM | POA: Insufficient documentation

## 2015-01-28 DIAGNOSIS — D649 Anemia, unspecified: Secondary | ICD-10-CM | POA: Insufficient documentation

## 2015-01-28 DIAGNOSIS — K8010 Calculus of gallbladder with chronic cholecystitis without obstruction: Secondary | ICD-10-CM | POA: Insufficient documentation

## 2015-01-28 DIAGNOSIS — Z880 Allergy status to penicillin: Secondary | ICD-10-CM | POA: Diagnosis not present

## 2015-01-28 DIAGNOSIS — J45909 Unspecified asthma, uncomplicated: Secondary | ICD-10-CM | POA: Insufficient documentation

## 2015-01-28 DIAGNOSIS — Z885 Allergy status to narcotic agent status: Secondary | ICD-10-CM | POA: Diagnosis not present

## 2015-01-28 DIAGNOSIS — Z85820 Personal history of malignant melanoma of skin: Secondary | ICD-10-CM | POA: Diagnosis not present

## 2015-01-28 DIAGNOSIS — K8020 Calculus of gallbladder without cholecystitis without obstruction: Secondary | ICD-10-CM

## 2015-01-28 DIAGNOSIS — Z79899 Other long term (current) drug therapy: Secondary | ICD-10-CM | POA: Insufficient documentation

## 2015-01-28 DIAGNOSIS — F418 Other specified anxiety disorders: Secondary | ICD-10-CM | POA: Diagnosis not present

## 2015-01-28 DIAGNOSIS — F329 Major depressive disorder, single episode, unspecified: Secondary | ICD-10-CM | POA: Diagnosis not present

## 2015-01-28 HISTORY — PX: CHOLECYSTECTOMY: SHX55

## 2015-01-28 SURGERY — LAPAROSCOPIC CHOLECYSTECTOMY WITH INTRAOPERATIVE CHOLANGIOGRAM
Anesthesia: General | Site: Abdomen

## 2015-01-28 MED ORDER — SODIUM CHLORIDE 0.9 % IV SOLN
INTRAVENOUS | Status: DC | PRN
  Administered 2015-01-28: 29 mL

## 2015-01-28 MED ORDER — LIDOCAINE HCL (CARDIAC) 20 MG/ML IV SOLN
INTRAVENOUS | Status: DC | PRN
  Administered 2015-01-28: 80 mg via INTRAVENOUS

## 2015-01-28 MED ORDER — BUPIVACAINE-EPINEPHRINE 0.25% -1:200000 IJ SOLN
INTRAMUSCULAR | Status: DC | PRN
  Administered 2015-01-28: 23 mL

## 2015-01-28 MED ORDER — GLUCAGON HCL RDNA (DIAGNOSTIC) 1 MG IJ SOLR
INTRAMUSCULAR | Status: DC | PRN
Start: 2015-01-28 — End: 2015-01-28
  Administered 2015-01-28: 1 mg via INTRAVENOUS

## 2015-01-28 MED ORDER — ONDANSETRON HCL 4 MG PO TABS: 4.0000 mg | ORAL_TABLET | Freq: Four times a day (QID) | ORAL | Status: DC | PRN

## 2015-01-28 MED ORDER — FENTANYL CITRATE (PF) 250 MCG/5ML IJ SOLN
INTRAMUSCULAR | Status: AC
  Filled 2015-01-28: qty 5

## 2015-01-28 MED ORDER — ALBUTEROL SULFATE HFA 108 (90 BASE) MCG/ACT IN AERS: 1.0000 | INHALATION_SPRAY | Freq: Four times a day (QID) | RESPIRATORY_TRACT | Status: DC | PRN

## 2015-01-28 MED ORDER — ONDANSETRON HCL 4 MG/2ML IJ SOLN
INTRAMUSCULAR | Status: DC | PRN
  Administered 2015-01-28: 4 mg via INTRAVENOUS

## 2015-01-28 MED ORDER — PROPOFOL 10 MG/ML IV BOLUS
INTRAVENOUS | Status: DC | PRN
  Administered 2015-01-28: 200 mg via INTRAVENOUS

## 2015-01-28 MED ORDER — LEVOTHYROXINE SODIUM 25 MCG PO TABS
25.0000 ug | ORAL_TABLET | Freq: Every day | ORAL | Status: DC
  Administered 2015-01-29: 25 ug via ORAL
  Filled 2015-01-28: qty 1

## 2015-01-28 MED ORDER — HYDROMORPHONE HCL 1 MG/ML IJ SOLN
0.5000 mg | INTRAMUSCULAR | Status: DC | PRN
  Administered 2015-01-28 (×2): 0.5 mg via INTRAVENOUS

## 2015-01-28 MED ORDER — 0.9 % SODIUM CHLORIDE (POUR BTL) OPTIME
TOPICAL | Status: DC | PRN
  Administered 2015-01-28: 1000 mL

## 2015-01-28 MED ORDER — DEXAMETHASONE SODIUM PHOSPHATE 4 MG/ML IJ SOLN
INTRAMUSCULAR | Status: DC | PRN
  Administered 2015-01-28: 4 mg via INTRAVENOUS

## 2015-01-28 MED ORDER — PHENYLEPHRINE HCL 10 MG/ML IJ SOLN
INTRAMUSCULAR | Status: DC | PRN
  Administered 2015-01-28: 40 ug via INTRAVENOUS
  Administered 2015-01-28: 80 ug via INTRAVENOUS

## 2015-01-28 MED ORDER — HYDROMORPHONE HCL 1 MG/ML IJ SOLN
INTRAMUSCULAR | Status: AC
  Filled 2015-01-28: qty 1

## 2015-01-28 MED ORDER — NEOSTIGMINE METHYLSULFATE 10 MG/10ML IV SOLN
INTRAVENOUS | Status: DC | PRN
  Administered 2015-01-28: 3 mg via INTRAVENOUS

## 2015-01-28 MED ORDER — ALBUTEROL SULFATE (2.5 MG/3ML) 0.083% IN NEBU: 2.5000 mg | INHALATION_SOLUTION | Freq: Four times a day (QID) | RESPIRATORY_TRACT | Status: DC | PRN

## 2015-01-28 MED ORDER — FENTANYL CITRATE (PF) 100 MCG/2ML IJ SOLN
INTRAMUSCULAR | Status: DC | PRN
  Administered 2015-01-28 (×3): 50 ug via INTRAVENOUS
  Administered 2015-01-28: 100 ug via INTRAVENOUS

## 2015-01-28 MED ORDER — SUCCINYLCHOLINE CHLORIDE 20 MG/ML IJ SOLN
INTRAMUSCULAR | Status: DC | PRN
  Administered 2015-01-28: 100 mg via INTRAVENOUS

## 2015-01-28 MED ORDER — GLYCOPYRROLATE 0.2 MG/ML IJ SOLN
INTRAMUSCULAR | Status: AC
  Filled 2015-01-28: qty 2

## 2015-01-28 MED ORDER — LACTATED RINGERS IV SOLN
INTRAVENOUS | Status: DC
  Administered 2015-01-28 (×2): via INTRAVENOUS

## 2015-01-28 MED ORDER — PROPOFOL 10 MG/ML IV BOLUS
INTRAVENOUS | Status: AC
  Filled 2015-01-28: qty 20

## 2015-01-28 MED ORDER — GLUCAGON HCL RDNA (DIAGNOSTIC) 1 MG IJ SOLR
INTRAMUSCULAR | Status: AC
  Filled 2015-01-28: qty 1

## 2015-01-28 MED ORDER — MORPHINE SULFATE 2 MG/ML IJ SOLN
1.0000 mg | INTRAMUSCULAR | Status: DC | PRN
  Administered 2015-01-28: 2 mg via INTRAVENOUS
  Filled 2015-01-28: qty 1

## 2015-01-28 MED ORDER — KCL IN DEXTROSE-NACL 20-5-0.9 MEQ/L-%-% IV SOLN
INTRAVENOUS | Status: DC
  Administered 2015-01-28: 15:00:00 via INTRAVENOUS
  Filled 2015-01-28 (×3): qty 1000

## 2015-01-28 MED ORDER — OXYCODONE-ACETAMINOPHEN 5-325 MG PO TABS
1.0000 | ORAL_TABLET | ORAL | Status: DC | PRN
  Administered 2015-01-29: 1 via ORAL
  Filled 2015-01-28: qty 1
  Filled 2015-01-28: qty 2

## 2015-01-28 MED ORDER — ONDANSETRON HCL 4 MG/2ML IJ SOLN
4.0000 mg | Freq: Once | INTRAMUSCULAR | Status: AC | PRN
  Administered 2015-01-28: 4 mg via INTRAVENOUS

## 2015-01-28 MED ORDER — ROCURONIUM BROMIDE 50 MG/5ML IV SOLN
INTRAVENOUS | Status: AC
  Filled 2015-01-28: qty 1

## 2015-01-28 MED ORDER — LIDOCAINE HCL 4 % MT SOLN
OROMUCOSAL | Status: DC | PRN
  Administered 2015-01-28: 4 mL via TOPICAL

## 2015-01-28 MED ORDER — ONDANSETRON HCL 4 MG/2ML IJ SOLN
4.0000 mg | Freq: Four times a day (QID) | INTRAMUSCULAR | Status: DC | PRN
  Filled 2015-01-28: qty 2

## 2015-01-28 MED ORDER — HEPARIN SODIUM (PORCINE) 5000 UNIT/ML IJ SOLN
5000.0000 [IU] | Freq: Three times a day (TID) | INTRAMUSCULAR | Status: DC
  Administered 2015-01-29: 5000 [IU] via SUBCUTANEOUS
  Filled 2015-01-28: qty 1

## 2015-01-28 MED ORDER — GLYCOPYRROLATE 0.2 MG/ML IJ SOLN
INTRAMUSCULAR | Status: DC | PRN
  Administered 2015-01-28: .4 mg via INTRAVENOUS

## 2015-01-28 MED ORDER — SODIUM CHLORIDE 0.9 % IR SOLN
Status: DC | PRN
  Administered 2015-01-28: 1000 mL

## 2015-01-28 MED ORDER — ROCURONIUM BROMIDE 100 MG/10ML IV SOLN
INTRAVENOUS | Status: DC | PRN
  Administered 2015-01-28: 10 mg via INTRAVENOUS
  Administered 2015-01-28: 20 mg via INTRAVENOUS

## 2015-01-28 MED ORDER — PANTOPRAZOLE SODIUM 40 MG PO TBEC
40.0000 mg | DELAYED_RELEASE_TABLET | Freq: Every day | ORAL | Status: DC
  Administered 2015-01-29: 40 mg via ORAL
  Filled 2015-01-28: qty 1

## 2015-01-28 MED ORDER — ONDANSETRON HCL 4 MG/2ML IJ SOLN
INTRAMUSCULAR | Status: AC
  Filled 2015-01-28: qty 2

## 2015-01-28 SURGICAL SUPPLY — 36 items
APPLIER CLIP 5 13 M/L LIGAMAX5 (MISCELLANEOUS) ×2
APR CLP MED LRG 5 ANG JAW (MISCELLANEOUS) ×1
BAG SPEC RTRVL LRG 6X4 10 (ENDOMECHANICALS) ×1
BLADE SURG ROTATE 9660 (MISCELLANEOUS) IMPLANT
CANISTER SUCTION 2500CC (MISCELLANEOUS) ×2 IMPLANT
CATH REDDICK CHOLANGI 4FR 50CM (CATHETERS) ×2 IMPLANT
CHLORAPREP W/TINT 26ML (MISCELLANEOUS) ×2 IMPLANT
CLIP APPLIE 5 13 M/L LIGAMAX5 (MISCELLANEOUS) ×1 IMPLANT
COVER MAYO STAND STRL (DRAPES) ×2 IMPLANT
COVER SURGICAL LIGHT HANDLE (MISCELLANEOUS) ×2 IMPLANT
DRAPE C-ARM 42X72 X-RAY (DRAPES) ×2 IMPLANT
ELECT REM PT RETURN 9FT ADLT (ELECTROSURGICAL) ×2
ELECTRODE REM PT RTRN 9FT ADLT (ELECTROSURGICAL) ×1 IMPLANT
GLOVE BIO SURGEON STRL SZ7.5 (GLOVE) ×3 IMPLANT
GLOVE BIOGEL PI IND STRL 7.5 (GLOVE) IMPLANT
GLOVE BIOGEL PI INDICATOR 7.5 (GLOVE) ×2
GOWN STRL REUS W/ TWL LRG LVL3 (GOWN DISPOSABLE) ×3 IMPLANT
GOWN STRL REUS W/TWL LRG LVL3 (GOWN DISPOSABLE) ×6
IV CATH 14GX2 1/4 (CATHETERS) ×2 IMPLANT
KIT BASIN OR (CUSTOM PROCEDURE TRAY) ×2 IMPLANT
KIT ROOM TURNOVER OR (KITS) ×2 IMPLANT
LIQUID BAND (GAUZE/BANDAGES/DRESSINGS) ×2 IMPLANT
NS IRRIG 1000ML POUR BTL (IV SOLUTION) ×2 IMPLANT
PAD ARMBOARD 7.5X6 YLW CONV (MISCELLANEOUS) ×2 IMPLANT
POUCH SPECIMEN RETRIEVAL 10MM (ENDOMECHANICALS) ×2 IMPLANT
SCISSORS LAP 5X35 DISP (ENDOMECHANICALS) ×2 IMPLANT
SET IRRIG TUBING LAPAROSCOPIC (IRRIGATION / IRRIGATOR) ×2 IMPLANT
SLEEVE ENDOPATH XCEL 5M (ENDOMECHANICALS) ×4 IMPLANT
SPECIMEN JAR SMALL (MISCELLANEOUS) ×2 IMPLANT
SUT MNCRL AB 4-0 PS2 18 (SUTURE) ×2 IMPLANT
TOWEL OR 17X24 6PK STRL BLUE (TOWEL DISPOSABLE) ×2 IMPLANT
TOWEL OR 17X26 10 PK STRL BLUE (TOWEL DISPOSABLE) ×2 IMPLANT
TRAY LAPAROSCOPIC MC (CUSTOM PROCEDURE TRAY) ×2 IMPLANT
TROCAR XCEL BLUNT TIP 100MML (ENDOMECHANICALS) ×2 IMPLANT
TROCAR XCEL NON-BLD 5MMX100MML (ENDOMECHANICALS) ×2 IMPLANT
TUBING INSUFFLATION (TUBING) ×2 IMPLANT

## 2015-01-28 NOTE — Op Note (Addendum)
01/28/2015  12:41 PM  PATIENT:  [name]  74 y.o. female  PRE-OPERATIVE DIAGNOSIS:  Gallstones  POST-OPERATIVE DIAGNOSIS:  Gallstones  PROCEDURE:  Procedure(s): LAPAROSCOPIC CHOLECYSTECTOMY WITH INTRAOPERATIVE CHOLANGIOGRAM (N/A)  SURGEON:  Surgeon(s) and Role:    * Jovita Kussmaul, MD - Primary    * Fanny Skates, MD - Assisting  PHYSICIAN ASSISTANT:   ASSISTANTS: Dr. Dalbert Batman   ANESTHESIA:   general  EBL:  Total I/O In: 1000 [I.V.:1000] Out: 20 [Blood:20]  BLOOD ADMINISTERED:none  DRAINS: none   LOCAL MEDICATIONS USED:  MARCAINE     SPECIMEN:  Source of Specimen:  gallbladder  DISPOSITION OF SPECIMEN:  PATHOLOGY  COUNTS:  YES  TOURNIQUET:  * No tourniquets in log *  DICTATION: .Dragon Dictation  @opnoteheader @  Procedure: After informed consent was obtained the patient was brought to the operating room and placed in the supine position on the operating room table. After adequate induction of general anesthesia the patient's abdomen was prepped with ChloraPrep allowed to dry and draped in usual sterile manner. The area below the umbilicus was infiltrated with quarter percent  Marcaine. A small incision was made with a 15 blade knife. The incision was carried down through the subcutaneous tissue bluntly with a hemostat and Army-Nav[employer] retractors. The linea alba was identified. The linea alba was incised with a 15 blade knife and each side was grasped with Coker clamps. The preperitoneal space was then probed with a hemostat until the peritoneum was opened and access was gained to the abdominal cavity. A 0 Vicryl pursestring stitch was placed in the fascia surrounding the opening. A Hassan cannula was then placed through the opening and anchored in place with the previously placed Vicryl purse string stitch. The abdomen was insufflated with carbon dioxide without difficulty. A laparoscope was inserted through the [facility] cannula in the right upper quadrant was inspected.  Next the epigastric region was infiltrated with % Marcaine. A small incision was made with a 15 blade knife. A 5 mm port was placed bluntly through this incision into the abdominal cavity under direct vision. Next 2 sites were chosen laterally on the right side of the abdomen for placement of 5 mm ports. Each of these areas was infiltrated with quarter percent Marcaine. Small stab incisions were made with a 15 blade knife. 5 mm ports were then placed bluntly through these incisions into the abdominal cavity under direct vision without difficulty. A blunt grasper was placed through the lateralmost 5 mm port and used to grasp the dome of the gallbladder and elevated anteriorly and superiorly. Another blunt grasper was placed through the other 5 mm port and used to retract the body and neck of the gallbladder. A dissector was placed through the epigastric port and using the electrocautery the peritoneal reflection at the gallbladder neck was opened. Blunt dissection was then carried out in this area until the gallbladder neck-cystic duct junction was readily identified and a good window was created. A single clip was placed on the gallbladder neck. A small  ductotomy was made just below the clip with laparoscopic scissors. A 14-gauge Angiocath was then placed through the anterior abdominal wall under direct vision. A Reddick cholangiogram catheter was then placed through the Angiocath and flushed. The catheter was then placed in the cystic duct and anchored in place with a clip. A cholangiogram was obtained that showed no filling defects but no emptying into the duodenum and adequate length on the cystic duct. Glucagon was given but there  was no change on repeat cholangiogram. The anchoring clip was removed and the catheter was able to be passed distally into the duodenum with no difficulty. The catheters were then removed from the patient. 3 clips were placed proximally on the cystic duct and the duct was divided  between the 2 sets of clips. Posterior to this the cystic artery was identified and again dissected bluntly in a circumferential manner until a good window  was created. 2 clips were placed proximally and one distally on the artery and the artery was divided between the 2 sets of clips. Next a laparoscopic hook cautery device was used to separate the gallbladder from the liver bed. Prior to completely detaching the gallbladder from the liver bed the liver bed was inspected and several small bleeding points were coagulated with the electrocautery until the area was completely hemostatic. The gallbladder was then detached the rest of it from the liver bed without difficulty. A laparoscopic bag was inserted through the hassan port. The laparoscope was moved to the epigastric port. The gallbladder was placed within the bag and the bag was sealed.  The bag with the gallbladder was then removed with the [facility] cannula through the infraumbilical port without difficulty. The fascial defect was then closed with the previously placed Vicryl pursestring stitch as well as with another figure-of-eight 0 Vicryl stitch. The liver bed was inspected again and found to be hemostatic. The abdomen was irrigated with copious amounts of saline until the effluent was clear. The ports were then removed under direct vision without difficulty and were found to be hemostatic. The gas was allowed to escape. The skin incisions were all closed with interrupted 4-0 Monocryl subcuticular stitches. Dermabond dressings were applied. The patient tolerated the procedure well. At the end of the case all needle sponge and instrument counts were correct. The patient was then awakened and taken to recovery in stable condition  PLAN OF CARE: Admit for overnight observation  PATIENT DISPOSITION:  PACU - hemodynamically stable.   Delay start of Pharmacological VTE agent (>24hrs) due to surgical blood loss or risk of bleeding: no

## 2015-01-28 NOTE — Anesthesia Procedure Notes (Signed)
Procedure Name: Intubation Date/Time: 01/28/2015 11:53 AM Performed by: Merdis Delay Pre-anesthesia Checklist: Patient identified, Timeout performed, Emergency Drugs available, Patient being monitored and Suction available Patient Re-evaluated:Patient Re-evaluated prior to inductionOxygen Delivery Method: Circle system utilized Preoxygenation: Pre-oxygenation with 100% oxygen Intubation Type: IV induction Ventilation: Mask ventilation without difficulty Laryngoscope Size: Mac and 3 Grade View: Grade III Tube type: Oral Tube size: 7.0 mm Number of attempts: 1 Airway Equipment and Method: Bougie stylet Placement Confirmation: ETT inserted through vocal cords under direct vision,  breath sounds checked- equal and bilateral,  positive ETCO2 and CO2 detector Secured at: 22 cm Tube secured with: Tape Dental Injury: Teeth and Oropharynx as per pre-operative assessment  Difficulty Due To: Difficult Airway- due to reduced neck mobility and Difficult Airway- due to limited oral opening

## 2015-01-28 NOTE — H&P (Signed)
[name] 12/30/2014 8:37 AM Location: [facility] Patient #: [id number] DOB: [birth year] Divorced / Language: Cleophus Molt / Race: White Female  History of Present Illness Leah Hines. Marlou Starks MD; 12/30/2014 9:03 AM) Patient words: gallbladder.  The patient is a 74 year old female who presents with abdominal pain. The patient is a 74 year old white female who is a long-term patient of mine. She is 4 years out from a wide excision of a melanoma from her back. About 2 weeks ago she was admitted to [facility] regional with abdominal pain. She was found to have gallstone pancreatitis. This resolved and her liver functions normalized. She did have gallstones in her gallbladder seen on ultrasound and CT scan. She feels much better now. She still has some low-grade underlying nausea and abdominal soreness.   Allergies (Sonya Bynum, CMA; 12/30/2014 8:38 AM) EPINEPHrine *CHEMICALS* Anaphylaxis. Penicillins Valium *ANTIANXIETY AGENTS*  Medication History Davy Pique Bynum, CMA; 12/30/2014 8:38 AM) Pseudoeph-Bromphen-DM (30-2-10MG /5ML Syrup, Oral) Active. Fluconazole (200MG  Tablet, Oral) Active. Ketoconazole (2% Cream, External) Active. Levothyroxine Sodium (25MCG Tablet, Oral) Active. Meclizine HCl (25MG  Tablet, Oral) Active. Omeprazole (40MG  Capsule DR, Oral) Active. CoQ10 (100MG  Capsule, Oral) Active. Vitamin D (2000UNIT Capsule, Oral) Active. Aspirin Low Strength (81MG  Tablet Chewable, Oral) Active. Medications Reconciled  Review of Systems Eddie Dibbles S. Marlou Starks MD; 12/30/2014 9:03 AM) General Present- Fatigue. Not Present- Appetite Loss, Chills, Fever, Night Sweats, Weight Gain and Weight Loss. Skin Present- New Lesions. Not Present- Change in Wart/Mole, Dryness, Hives, Jaundice, Non-Healing Wounds, Rash and Ulcer. HEENT Not Present- Earache, Hearing Loss, Hoarseness, Nose Bleed, Oral Ulcers, Ringing in the Ears, Seasonal Allergies, Sinus Pain, Sore Throat, Visual Disturbances, Wears  glasses/contact lenses and Yellow Eyes. Respiratory Not Present- Bloody sputum, Chronic Cough, Difficulty Breathing, Snoring and Wheezing. Breast Not Present- Breast Mass, Breast Pain, Nipple Discharge and Skin Changes. Cardiovascular Present- Rapid Heart Rate and Shortness of Breath. Not Present- Chest Pain, Difficulty Breathing Lying Down, Leg Cramps, Palpitations and Swelling of Extremities. Gastrointestinal Not Present- Abdominal Pain, Bloating, Bloody Stool, Change in Bowel Habits, Chronic diarrhea, Constipation, Difficulty Swallowing, Excessive gas, Gets full quickly at meals, Hemorrhoids, Indigestion, Nausea, Rectal Pain and Vomiting. Musculoskeletal Present- Joint Pain and Joint Stiffness. Not Present- Back Pain, Muscle Pain, Muscle Weakness and Swelling of Extremities. Neurological Not Present- Decreased Memory, Fainting, Headaches, Numbness, Seizures, Tingling, Tremor, Trouble walking and Weakness. Psychiatric Not Present- Anxiety, Bipolar, Change in Sleep Pattern, Depression, Fearful and Frequent crying. Endocrine Present- Hair Changes. Not Present- Cold Intolerance, Excessive Hunger, Heat Intolerance, Hot flashes and New Diabetes. Hematology Not Present- Easy Bruising, Excessive bleeding, Gland problems, HIV and Persistent Infections.   Vitals (Sonya Bynum CMA; 12/30/2014 8:37 AM) 12/30/2014 8:37 AM Weight: 184 lb Height: 66in Body Surface Area: 1.97 m Body Mass Index: 29.7 kg/m Temp.: 98.68F(Temporal)  Pulse: 68 (Regular)  BP: 124/80 (Sitting, Left Arm, Standard)    Physical Exam Eddie Dibbles S. Marlou Starks MD; 12/30/2014 9:04 AM) General Mental Status-Alert. General Appearance-Consistent with stated age. Hydration-Well hydrated. Voice-Normal.  Head and Neck Head-normocephalic, atraumatic with no lesions or palpable masses. Trachea-midline. Thyroid Gland Characteristics - normal size and consistency.  Eye Eyeball - Bilateral-Extraocular movements  intact. Sclera/Conjunctiva - Bilateral-No scleral icterus.  Chest and Lung Exam Chest and lung exam reveals -quiet, even and easy respiratory effort with no use of accessory muscles and on auscultation, normal breath sounds, no adventitious sounds and normal vocal resonance. Inspection Chest Wall - Normal. Back - normal.  Cardiovascular Cardiovascular examination reveals -normal heart sounds, regular rate and rhythm with no  murmurs and normal pedal pulses bilaterally.  Abdomen Note: The abdomen is soft with minimal tenderness. There is no palpable mass.   Neurologic Neurologic evaluation reveals -alert and oriented x 3 with no impairment of recent or remote memory. Mental Status-Normal.  Musculoskeletal Normal Exam - Left-Upper Extremity Strength Normal and Lower Extremity Strength Normal. Normal Exam - Right-Upper Extremity Strength Normal and Lower Extremity Strength Normal.  Lymphatic Head & Neck  General Head & Neck Lymphatics: Bilateral - Description - Normal. Axillary  General Axillary Region: Bilateral - Description - Normal. Tenderness - Non Tender. Femoral & Inguinal  Generalized Femoral & Inguinal Lymphatics: Bilateral - Description - Normal. Tenderness - Non Tender.    Assessment & Plan Eddie Dibbles S. Marlou Starks MD; 12/30/2014 9:06 AM) GALLSTONE PANCREATITIS (577.0  K85.1) Impression: The patient appears to have had a recent episode of gallstone pancreatitis. It looks like all of her liver functions and pancreatic enzymes have normalized. Because of the risk of further painful episodes and recurrence of the pancreatitis I think she would benefit from having her gallbladder removed. She would also like to have this done. I have discussed with her in detail the risks and benefits of the operation to remove the gallbladder as well as some of the technical aspects and she understands and wishes to proceed Current Plans  Pt Education - Pancreatitis: discussed with  patient and provided information.   Signed by Luella Cook, MD (12/30/2014 9:06 AM)

## 2015-01-28 NOTE — Interval H&P Note (Signed)
History and Physical Interval Note:  01/28/2015 8:23 AM  [name]  has presented today for surgery, with the diagnosis of Gallstones  The various methods of treatment have been discussed with the patient and family. After consideration of risks, benefits and other options for treatment, the patient has consented to  Procedure(s): LAPAROSCOPIC CHOLECYSTECTOMY WITH INTRAOPERATIVE CHOLANGIOGRAM (N/A) as a surgical intervention .  The patient's history has been reviewed, patient examined, no change in status, stable for surgery.  I have reviewed the patient's chart and labs.  Questions were answered to the patient's satisfaction.     TOTH III,Zethan Alfieri S

## 2015-01-28 NOTE — Anesthesia Preprocedure Evaluation (Addendum)
Anesthesia Evaluation  Patient identified by MRN, date of birth, ID band Patient awake    Reviewed: Allergy & Precautions, NPO status , Patient's Chart, lab work & pertinent test results  Airway Mallampati: III  TM Distance: <3 FB Neck ROM: Limited    Dental  (+) Teeth Intact, Dental Advisory Given   Pulmonary asthma ,    Pulmonary exam normal       Cardiovascular Normal cardiovascular exam    Neuro/Psych  Headaches, Anxiety Depression  Neuromuscular disease    GI/Hepatic GERD-  ,  Endo/Other  Hypothyroidism   Renal/GU      Musculoskeletal  (+) Arthritis -,   Abdominal   Peds  Hematology  (+) anemia ,   Anesthesia Other Findings   Reproductive/Obstetrics                           Anesthesia Physical Anesthesia Plan  ASA: II  Anesthesia Plan: General   Post-op Pain Management:    Induction: Intravenous  Airway Management Planned: Oral ETT  Additional Equipment:   Intra-op Plan:   Post-operative Plan: Extubation in OR  Informed Consent: I have reviewed the patients History and Physical, chart, labs and discussed the procedure including the risks, benefits and alternatives for the proposed anesthesia with the patient or authorized representative who has indicated his/her understanding and acceptance.     Plan Discussed with: CRNA, Anesthesiologist and Surgeon  Anesthesia Plan Comments:         Anesthesia Quick Evaluation

## 2015-01-28 NOTE — Transfer of Care (Signed)
Immediate Anesthesia Transfer of Care Note  Patient: [name]  Procedure(s) Performed: Procedure(s): LAPAROSCOPIC CHOLECYSTECTOMY WITH INTRAOPERATIVE CHOLANGIOGRAM (N/A)  Patient Location: PACU  Anesthesia Type:General  Level of Consciousness: awake, alert  and oriented  Airway & Oxygen Therapy: Patient Spontanous Breathing and Patient connected to nasal cannula oxygen  Post-op Assessment: Report given to RN and Post -op Vital signs reviewed and stable  Post vital signs: Reviewed and stable  Last Vitals:  Filed Vitals:   01/28/15 0938  BP: 141/63  Pulse: 66  Temp: 36.7 C  Resp: 20    Complications: No apparent anesthesia complications

## 2015-01-29 ENCOUNTER — Encounter (HOSPITAL_COMMUNITY): Payer: Self-pay | Admitting: General Surgery

## 2015-01-29 DIAGNOSIS — K8010 Calculus of gallbladder with chronic cholecystitis without obstruction: Secondary | ICD-10-CM | POA: Diagnosis not present

## 2015-01-29 LAB — COMPREHENSIVE METABOLIC PANEL
ALT: 44 U/L (ref 14–54)
AST: 41 U/L (ref 15–41)
Albumin: 3.4 g/dL — ABNORMAL LOW (ref 3.5–5.0)
Alkaline Phosphatase: 57 U/L (ref 38–126)
Anion gap: 10 (ref 5–15)
BUN: 12 mg/dL (ref 6–20)
CO2: 24 mmol/L (ref 22–32)
Calcium: 8.8 mg/dL — ABNORMAL LOW (ref 8.9–10.3)
Chloride: 107 mmol/L (ref 101–111)
Creatinine, Ser: 0.92 mg/dL (ref 0.44–1.00)
GFR calc Af Amer: >60 mL/min (ref >60)
GFR calc non Af Amer: 60 mL/min — ABNORMAL LOW (ref >60)
Glucose, Bld: 167 mg/dL — ABNORMAL HIGH (ref 65–99)
Potassium: 4.2 mmol/L (ref 3.5–5.1)
Sodium: 141 mmol/L (ref 135–145)
Total Bilirubin: 0.9 mg/dL (ref 0.3–1.2)
Total Protein: 5.6 g/dL — ABNORMAL LOW (ref 6.5–8.1)

## 2015-01-29 NOTE — Progress Notes (Signed)
1 Day Post-Op  Subjective: She had some nausea and vomiting overnight but feels better this am.   Objective: Vital signs in last 24 hours: Temp:  [97.7 F (36.5 C)-98.1 F (36.7 C)] 97.9 F (36.6 C) (07/14 0539) Pulse Rate:  [49-71] 64 (07/14 0539) Resp:  [15-25] 16 (07/14 0539) BP: (125-170)/(53-85) 125/57 mmHg (07/14 0539) SpO2:  [94 %-100 %] 96 % (07/14 0539) Weight:  [84.369 kg (186 lb)] 84.369 kg (186 lb) (07/13 0938) Last BM Date: 01/28/15  Intake/Output from previous day: 07/13 0701 - 07/14 0700 In: 2772 [P.O.:462; I.V.:2310] Out: 120 [Urine:100; Blood:20] Intake/Output this shift:    Resp: clear to auscultation bilaterally Cardio: regular rate and rhythm GI: soft, mild tenderness. incisions ok  Lab Results:  No results for input(s): WBC, HGB, HCT, PLT in the last 72 hours. BMET  Recent Labs  01/29/15 0328  NA 141  K 4.2  CL 107  CO2 24  GLUCOSE 167*  BUN 12  CREATININE 0.92  CALCIUM 8.8*   PT/INR No results for input(s): LABPROT, INR in the last 72 hours. ABG No results for input(s): PHART, HCO3 in the last 72 hours.  Invalid input(s): PCO2, PO2  Studies/Results: Dg Cholangiogram Operative  01/28/2015   CLINICAL DATA:  Gallstones  EXAM: INTRAOPERATIVE CHOLANGIOGRAM  TECHNIQUE: Cholangiographic images from the C-arm fluoroscopic device were submitted for interpretation post-operatively. Please see the procedural report for the amount of contrast and the fluoroscopy time utilized.  COMPARISON:  None.  FINDINGS: Contrast fills the biliary tree, pancreatic duct, and duodenum. There are no filling defects and biliary tree.  IMPRESSION: Patent biliary tree without evidence of common bile duct stones.   Electronically Signed   By: Marybelle Killings M.D.   On: 01/28/2015 13:35    Anti-infectives: Anti-infectives    Start     Dose/Rate Route Frequency Ordered Stop   01/28/15 1100  ciprofloxacin (CIPRO) IVPB 400 mg     400 mg 200 mL/hr over 60 Minutes Intravenous  To ShortStay Surgical 01/27/15 1258 01/28/15 1158      Assessment/Plan: s/p Procedure(s): LAPAROSCOPIC CHOLECYSTECTOMY WITH INTRAOPERATIVE CHOLANGIOGRAM (N/A) Advance diet Discharge     TOTH III,Sherif Millspaugh S 01/29/2015

## 2015-01-29 NOTE — Progress Notes (Signed)
[name] Churches to be D/C'd Home per MD order.  Discussed with the patient and all questions fully answered.  VSS, Surgical incision sites clean, dry, intact with no sign of infection. IV catheter discontinued intact. Site without signs and symptoms of complications. Dressing and pressure applied.  An After Visit Summary was printed and given to the patient. Patient received prescription.  D/c education completed with patient/family including follow up instructions, medication list, d/c activities limitations if indicated, with other d/c instructions as indicated by MD - patient able to verbalize understanding, all questions fully answered.   Patient instructed to return to ED, call 911, or call MD for any changes in condition.   Patient escorted via [place], and D/C home via private auto.  [name] 01/29/2015 11:50 AM

## 2015-01-29 NOTE — Discharge Summary (Signed)
Physician Discharge Summary  Patient ID: [name] MRN: [id number] DOB/AGE: 1940-08-23 74 y.o.  Admit date: 01/28/2015 Discharge date: 01/29/2015  Admission Diagnoses:  Discharge Diagnoses:  Active Problems:   Gallstones   Discharged Condition: good  Hospital Course: the pt underwent lap chole. Her cholangiogram showed no emptying into duodenum but no stones were seen and catheter passed into duodenum easily. She stayed overnight and lft's were appropriate the next morning. She was feeling better and will be discharged once she tolerates food  Consults: None  Significant Diagnostic Studies: none  Treatments: surgery: as above  Discharge Exam: Blood pressure 125/57, pulse 64, temperature 97.9 F (36.6 C), temperature source Oral, resp. rate 16, height 5\' 6"  (1.676 m), weight 84.369 kg (186 lb), SpO2 96 %. Resp: clear to auscultation bilaterally Cardio: regular rate and rhythm GI: soft, mild tenderness. incisions look good  Disposition: 01-Home or Self Care  Discharge Instructions    Call MD for:  difficulty breathing, headache or visual disturbances    Complete by:  As directed      Call MD for:  extreme fatigue    Complete by:  As directed      Call MD for:  hives    Complete by:  As directed      Call MD for:  persistant dizziness or light-headedness    Complete by:  As directed      Call MD for:  persistant nausea and vomiting    Complete by:  As directed      Call MD for:  redness, tenderness, or signs of infection (pain, swelling, redness, odor or green/yellow discharge around incision site)    Complete by:  As directed      Call MD for:  severe uncontrolled pain    Complete by:  As directed      Call MD for:  temperature >100.4    Complete by:  As directed      Diet - low sodium heart healthy    Complete by:  As directed      Discharge instructions    Complete by:  As directed   May shower. No heavy lifting. Low fat diet     Increase activity slowly     Complete by:  As directed      No wound care    Complete by:  As directed             Medication List    TAKE these medications        ADVANCED PROBIOTIC 10 Caps  Take 1 capsule by mouth daily.     albuterol 108 (90 BASE) MCG/ACT inhaler  Commonly known as:  PROVENTIL HFA;VENTOLIN HFA  Inhale 1 puff into the lungs every 6 (six) hours as needed for wheezing or shortness of breath.     albuterol 108 (90 BASE) MCG/ACT inhaler  Commonly known as:  PROVENTIL HFA;VENTOLIN HFA  Inhale 1-2 puffs into the lungs every 6 (six) hours as needed for wheezing or shortness of breath.     aspirin EC 81 MG tablet  Take 81 mg by mouth daily.     aspirin 81 MG tablet  Take 81 mg by mouth daily.     Biotin 5000 MCG Caps  Take 5,000 mcg by mouth daily.     CALCIUM 600+D 600-800 MG-UNIT Tabs  Generic drug:  Calcium Carb-Cholecalciferol  Take 1 tablet by mouth daily.     calcium-vitamin D 500-200 MG-UNIT per tablet  Commonly known as:  OSCAL WITH D  Take 1 tablet by mouth daily.     clotrimazole 1 % cream  Commonly known as:  LOTRIMIN  Apply 1 application topically at bedtime.     esomeprazole 20 MG capsule  Commonly known as:  NEXIUM  Take 20 mg by mouth daily at 12 noon.     esomeprazole 20 MG capsule  Commonly known as:  NEXIUM  Take 20 mg by mouth daily at 12 noon.     Fish Oil 500 MG Caps  Take 500 mg by mouth daily.     fish oil-omega-3 fatty acids 1000 MG capsule  Take 1 g by mouth daily.     ibuprofen 800 MG tablet  Commonly known as:  ADVIL,MOTRIN  Take 800 mg by mouth every 8 (eight) hours as needed for mild pain or moderate pain.     ibuprofen 800 MG tablet  Commonly known as:  ADVIL,MOTRIN  Take 800 mg by mouth every 8 (eight) hours as needed.     levothyroxine 25 MCG tablet  Commonly known as:  SYNTHROID, LEVOTHROID  Take 25 mcg by mouth daily before breakfast.     levothyroxine 25 MCG tablet  Commonly known as:  SYNTHROID, LEVOTHROID  Take 25 mcg by mouth  every morning. On an empty stomach     triamcinolone cream 0.1 %  Commonly known as:  KENALOG  Apply 1 application topically daily.     vitamin B-12 1000 MCG tablet  Commonly known as:  CYANOCOBALAMIN  Take 1,000 mcg by mouth daily.     VITAMIN B 12 PO  Take 3,000 mcg by mouth daily.     vitamin C 500 MG tablet  Commonly known as:  ASCORBIC ACID  Take 500 mg by mouth daily.     Vitamin D3 2000 UNITS capsule  Take 2,000 Units by mouth daily.     Vitamin D 2000 UNITS Caps  Take 2,000 Units by mouth daily.         SignedJovita Kussmaul S 01/29/2015, 7:35 AM

## 2015-01-29 NOTE — Anesthesia Postprocedure Evaluation (Signed)
  Anesthesia Post-op Note  Patient: [name]  Procedure(s) Performed: Procedure(s): LAPAROSCOPIC CHOLECYSTECTOMY WITH INTRAOPERATIVE CHOLANGIOGRAM (N/A)  Patient Location: PACU  Anesthesia Type:General  Level of Consciousness: awake, alert , oriented and patient cooperative  Airway and Oxygen Therapy: Patient Spontanous Breathing  Post-op Pain: mild, moderate  Post-op Assessment: Post-op Vital signs reviewed, Patient's Cardiovascular Status Stable, Respiratory Function Stable, Patent Airway, No signs of Nausea or vomiting and Pain level controlled              Post-op Vital Signs: stable  Last Vitals:  Filed Vitals:   01/29/15 0539  BP: 125/57  Pulse: 64  Temp: 36.6 C  Resp: 16    Complications: No apparent anesthesia complications

## 2015-02-13 ENCOUNTER — Other Ambulatory Visit (HOSPITAL_COMMUNITY): Payer: Self-pay | Admitting: Internal Medicine

## 2015-02-13 ENCOUNTER — Ambulatory Visit (HOSPITAL_COMMUNITY)
Admission: RE | Admit: 2015-02-13 | Discharge: 2015-02-13 | Disposition: A | Discharge status: Home / Self Care | Payer: Medicare Other | Source: Ambulatory Visit | Attending: Vascular Surgery | Admitting: Vascular Surgery

## 2015-02-13 DIAGNOSIS — G609 Hereditary and idiopathic neuropathy, unspecified: Secondary | ICD-10-CM

## 2015-02-23 ENCOUNTER — Telehealth: Payer: Self-pay | Admitting: Internal Medicine

## 2015-02-23 NOTE — Telephone Encounter (Signed)
pt called to r/s missed appt....done ....pt ok and aware of new d.t °

## 2015-03-09 ENCOUNTER — Other Ambulatory Visit: Payer: Self-pay | Admitting: General Surgery

## 2015-03-09 DIAGNOSIS — R52 Pain, unspecified: Secondary | ICD-10-CM

## 2015-03-09 DIAGNOSIS — R1011 Right upper quadrant pain: Secondary | ICD-10-CM

## 2015-03-10 ENCOUNTER — Ambulatory Visit
Admission: RE | Admit: 2015-03-10 | Discharge: 2015-03-10 | Disposition: A | Discharge status: Home / Self Care | Payer: Medicare Other | Source: Ambulatory Visit | Attending: General Surgery | Admitting: General Surgery

## 2015-03-10 MED ORDER — IOPAMIDOL (ISOVUE-300) INJECTION 61%
100.0000 mL | Freq: Once | INTRAVENOUS | Status: AC | PRN
  Administered 2015-03-10: 100 mL via INTRAVENOUS

## 2015-03-13 ENCOUNTER — Telehealth: Payer: Self-pay | Admitting: Internal Medicine

## 2015-03-13 NOTE — Telephone Encounter (Signed)
lvm for pt regarding to 8.30 appt moved to Sept.....md out of the office....pt ok and aware

## 2015-03-14 ENCOUNTER — Telehealth: Payer: Self-pay | Admitting: Surgery

## 2015-03-14 NOTE — Telephone Encounter (Signed)
Patient called today concerned with pain.  She has been having some abdominal pain.  Had CAT scan and workup suspicious for diverticulitis.  Recommended by her surgeon Dr. Marlou Starks to start Cipro and Flagyl.  She started that last night.  She is been trying to avoid any pain medications because she does not "want to take too many medications".  She felt a very sharp pain a half hour ago.  It concerned her.  She wanted to talk to Dr. Marlou Starks today, Saturday.  I noted I was taking calls from the office at this time.  She is not throwing up.  The pain has calmed down.  No fevers or chills.  I recommend that she pick an over-the-counter pain medications such as ibuprofen or Tylenol.  Take that rather regularly.  Use ice and/or a heating pad.  Continue the antibiotics.  Stick to a bland diet.  See if that calms things down.  If she does not improve or worsens, go to the emergency room where she may need to be admitted and reevaluated.  Questions answered.  She seemed reassured.  Adin Hector, M.D., F.A.C.S. Gastrointestinal and Minimally Invasive Surgery [facility] [address] [place], [place] [address] [phone] Main / Paging

## 2015-03-17 ENCOUNTER — Other Ambulatory Visit: Payer: Self-pay

## 2015-03-17 ENCOUNTER — Ambulatory Visit: Payer: Self-pay | Admitting: Internal Medicine

## 2015-03-24 ENCOUNTER — Encounter: Payer: Self-pay | Admitting: Gastroenterology

## 2015-04-13 ENCOUNTER — Other Ambulatory Visit: Payer: Self-pay | Admitting: *Deleted

## 2015-04-13 ENCOUNTER — Other Ambulatory Visit (HOSPITAL_BASED_OUTPATIENT_CLINIC_OR_DEPARTMENT_OTHER): Payer: Medicare Other

## 2015-04-13 ENCOUNTER — Encounter: Payer: Self-pay | Admitting: Internal Medicine

## 2015-04-13 ENCOUNTER — Ambulatory Visit (HOSPITAL_BASED_OUTPATIENT_CLINIC_OR_DEPARTMENT_OTHER): Payer: Medicare Other | Admitting: Internal Medicine

## 2015-04-13 ENCOUNTER — Telehealth: Payer: Self-pay | Admitting: Internal Medicine

## 2015-04-13 VITALS — BP 155/62 | HR 73 | Temp 97.8°F | Resp 18 | Ht 66.0 in | Wt 183.6 lb

## 2015-04-13 DIAGNOSIS — C4359 Malignant melanoma of other part of trunk: Secondary | ICD-10-CM

## 2015-04-13 LAB — COMPREHENSIVE METABOLIC PANEL (CC13)
ALT: 20 U/L (ref 0–55)
AST: 16 U/L (ref 5–34)
Albumin: 4 g/dL (ref 3.5–5.0)
Alkaline Phosphatase: 70 U/L (ref 40–150)
Anion Gap: 8 mEq/L (ref 3–11)
BUN: 19.2 mg/dL (ref 7.0–26.0)
CO2: 27 mEq/L (ref 22–29)
Calcium: 9.1 mg/dL (ref 8.4–10.4)
Chloride: 109 mEq/L (ref 98–109)
Creatinine: 1 mg/dL (ref 0.6–1.1)
EGFR: 59 mL/min/{1.73_m2} — ABNORMAL LOW (ref >90)
Glucose: 113 mg/dl (ref 70–140)
Potassium: 4.2 mEq/L (ref 3.5–5.1)
Sodium: 144 mEq/L (ref 136–145)
Total Bilirubin: 1.07 mg/dL (ref 0.20–1.20)
Total Protein: 6.8 g/dL (ref 6.4–8.3)

## 2015-04-13 LAB — CBC WITH DIFFERENTIAL/PLATELET
BASO%: 1.1 % (ref 0.0–2.0)
Basophils Absolute: 0.1 10*3/uL (ref 0.0–0.1)
EOS%: 3.3 % (ref 0.0–7.0)
Eosinophils Absolute: 0.2 10*3/uL (ref 0.0–0.5)
HCT: 40.8 % (ref 34.8–46.6)
HGB: 13.6 g/dL (ref 11.6–15.9)
LYMPH%: 31.2 % (ref 14.0–49.7)
MCH: 30.2 pg (ref 25.1–34.0)
MCHC: 33.4 g/dL (ref 31.5–36.0)
MCV: 90.4 fL (ref 79.5–101.0)
MONO#: 0.5 10*3/uL (ref 0.1–0.9)
MONO%: 8.9 % (ref 0.0–14.0)
NEUT#: 3 10*3/uL (ref 1.5–6.5)
NEUT%: 55.5 % (ref 38.4–76.8)
Platelets: 211 10*3/uL (ref 145–400)
RBC: 4.51 10*6/uL (ref 3.70–5.45)
RDW: 13.5 % (ref 11.2–14.5)
WBC: 5.5 10*3/uL (ref 3.9–10.3)
lymph#: 1.7 10*3/uL (ref 0.9–3.3)

## 2015-04-13 LAB — LACTATE DEHYDROGENASE (CC13): LDH: 241 U/L (ref 125–245)

## 2015-04-13 NOTE — Progress Notes (Signed)
[facility] [phone]   [phone]  OFFICE PROGRESS NOTE  Shamleffer, Herschell Dimes, MD [address]  [place] [address]  DIAGNOSIS: Stage IA malignant melanoma diagnosed in August of 2012.  PRIOR THERAPY: Status post wide excision with sentinel lymph node biopsy from the left axilla under the care of Dr. Marlou Starks.   CURRENT THERAPY: Observation.  INTERVAL HISTORY: [name] 74 y.o. female returns to the clinic today for annual month follow up visit. The patient is feeling fine today with no specific complaints. She had a recent laparoscopic cholecystectomy under the care of Dr.Toth. She denied having any significant weight loss or night sweats. She has no chest pain, shortness of breath, cough or hemoptysis. She had repeat CBC , and comprehensive metabolic panel performed earlier today and she is here for evaluation and discussion of her lab results. She is seen earlier today by her dermatologist Dr. Aniceto Boss for dermatologic evaluation and no suspicious melanoma lesions identified. She lost a cousin recently to ovarian cancer. She also has a family history of 3 breast cancer in her cousins. The patient isn't sure if the had any genetic counseling done.  MEDICAL HISTORY: Past Medical History  Diagnosis Date  . Acid reflux   . Thyroid disease   . Melanoma 2013  . Weight gain   . Fatigue   . Atypical moles   . Bronchitis   . IBD (inflammatory bowel disease)   . Dizziness   . Retina disorder     left  . LLQ abdominal pain   . Hypothyroidism   . Generalized headache     migraines  . Arthritis     all over  . Anemia     3 years ago  . Insomnia   . Anxiety     in past  . Depression     in past  . GERD (gastroesophageal reflux disease)   . Cancer     x2 stg II melanoma  . History of chemotherapy     topical cream for h/o skin CA  . Neuropathy, peripheral   . Complication of anesthesia     shoulder surgery- not  all the way asleep    ALLERGIES:  is allergic to epinephrine; penicillins; penicillins; valium; valium; and epinephrine.  MEDICATIONS:  Current Outpatient Prescriptions  Medication Sig Dispense Refill  . albuterol (PROVENTIL HFA;VENTOLIN HFA) 108 (90 BASE) MCG/ACT inhaler Inhale 1-2 puffs into the lungs every 6 (six) hours as needed for wheezing or shortness of breath.    Marland Kitchen aspirin EC 81 MG tablet Take 81 mg by mouth daily.    . Biotin 5000 MCG CAPS Take 5,000 mcg by mouth daily.    . Calcium Carb-Cholecalciferol (CALCIUM 600+D) 600-800 MG-UNIT TABS Take 1 tablet by mouth daily.    . calcium-vitamin D (OSCAL WITH D) 500-200 MG-UNIT per tablet Take 1 tablet by mouth daily.    . Cholecalciferol (VITAMIN D3) 2000 UNITS capsule Take 2,000 Units by mouth daily.    . clotrimazole (LOTRIMIN) 1 % cream Apply 1 application topically at bedtime.  1  . Cyanocobalamin (VITAMIN B 12 PO) Take 3,000 mcg by mouth daily.    Marland Kitchen esomeprazole (NEXIUM) 20 MG capsule Take 20 mg by mouth daily at 12 noon.    . fish oil-omega-3 fatty acids 1000 MG capsule Take 1 g by mouth daily.    Marland Kitchen levothyroxine (SYNTHROID, LEVOTHROID) 25 MCG tablet Take 25 mcg by  mouth every morning. On an empty stomach  0  . Omega-3 Fatty Acids (FISH OIL) 500 MG CAPS Take 500 mg by mouth daily.    . Probiotic Product (ADVANCED PROBIOTIC 10) CAPS Take 1 capsule by mouth daily.    Marland Kitchen triamcinolone cream (KENALOG) 0.1 % Apply 1 application topically daily.  0  . vitamin B-12 (CYANOCOBALAMIN) 1000 MCG tablet Take 1,000 mcg by mouth daily.    . vitamin C (ASCORBIC ACID) 500 MG tablet Take 500 mg by mouth daily.    Marland Kitchen ibuprofen (ADVIL,MOTRIN) 800 MG tablet Take 800 mg by mouth every 8 (eight) hours as needed.  5   No current facility-administered medications for this visit.    SURGICAL HISTORY:  Past Surgical History  Procedure Laterality Date  . Shoulder surgery      right  . Knee arthroscopy    . Closed manipulation shoulder      rt  .  Rotator cuff repair      left  . Tumor removal      ovary  . Total knee arthroplasty      right  . Melanoma excision with sentinel lymph node biopsy  03/18/11    Back, nodes neg  . Joint replacement      RTK  . Left knee miniscus tear repair    . Colonscopy      May 2014  . Colonoscopy with propofol N/A 11/20/2012    Procedure: COLONOSCOPY WITH PROPOFOL;  Surgeon: Garlan Fair, MD;  Location: [facility];  Service: Endoscopy;  Laterality: N/A;  . Cholecystectomy N/A 01/28/2015    Procedure: LAPAROSCOPIC CHOLECYSTECTOMY WITH INTRAOPERATIVE CHOLANGIOGRAM;  Surgeon: Autumn Messing III, MD;  Location: [facility];  Service: General;  Laterality: N/A;    REVIEW OF SYSTEMS:  A comprehensive review of systems was negative.   PHYSICAL EXAMINATION: General appearance: alert, cooperative and no distress Head: Normocephalic, without obvious abnormality, atraumatic Neck: no adenopathy Lymph nodes: Cervical, supraclavicular, and axillary nodes normal. Resp: clear to auscultation bilaterally Cardio: regular rate and rhythm, S1, S2 normal, no murmur, click, rub or gallop GI: soft, non-tender; bowel sounds normal; no masses,  no organomegaly Extremities: extremities normal, atraumatic, no cyanosis or edema   ECOG PERFORMANCE STATUS: 0 - Asymptomatic  Blood pressure 155/62, pulse 73, temperature 97.8 F (36.6 C), temperature source Oral, resp. rate 18, height _0  (1.676 m), weight 183 lb 9.6 oz (83.28 kg), SpO2 99 %.  LABORATORY DATA: Lab Results  Component Value Date   WBC 5.5 04/13/2015   HGB 13.6 04/13/2015   HCT 40.8 04/13/2015   MCV 90.4 04/13/2015   PLT 211 04/13/2015      Chemistry      Component Value Date/Time   NA 141 01/29/2015 0328   NA 144 11/28/2013 1102   K 4.2 01/29/2015 0328   K 4.2 11/28/2013 1102   CL 107 01/29/2015 0328   CL 113* 05/22/2012 1004   CO2 24 01/29/2015 0328   CO2 26 11/28/2013 1102   BUN 12 01/29/2015 0328   BUN 28.3* 11/28/2013 1102   CREATININE  0.92 01/29/2015 0328   CREATININE 1.0 11/28/2013 1102   CREATININE 0.79 07/26/2012 0834      Component Value Date/Time   CALCIUM 8.8* 01/29/2015 0328   CALCIUM 9.2 11/28/2013 1102   ALKPHOS 57 01/29/2015 0328   ALKPHOS 74 11/28/2013 1102   AST 41 01/29/2015 0328   AST 13 11/28/2013 1102   ALT 44 01/29/2015 0328   ALT 16 11/28/2013  1102   BILITOT 0.9 01/29/2015 0328   BILITOT 0.83 11/28/2013 1102       RADIOGRAPHIC STUDIES:  ASSESSMENT AND PLAN: this is a very pleasant 74 years old white female with history of stage IA malignant melanoma status post wide excision of the lesion from the mid back in addition to a sentinel lymph node biopsy that was negative for malignancy.   I discussed the lab results with the patient today. I gave her the option of follow-up visit with her primary care physician at this point but the patient is concerned about her family history of malignancy and would like to come back for evaluation in one year.I will see her back for follow-up visit in one year with repeat CBC, comprehensive metabolic panel and LDH in addition to a chest x-ray in one year. I also strongly advised the patient to discuss with her cousins genetic counseling for the several family members with breast and ovarian cancer to rule out BRCA1 and BRCA2 abnormalities. She was advised to call immediately if she has any concerning symptoms in the interval.  All questions were answered. The patient knows to call the clinic with any problems, questions or concerns. We can certainly see the patient much sooner if necessary.  Disclaimer: This note was dictated with voice recognition software. Similar sounding words can inadvertently be transcribed and may not be corrected upon review.

## 2015-04-13 NOTE — Telephone Encounter (Signed)
Gave and printed appt sched and avs for pt for Sept 2017 °

## 2015-05-11 ENCOUNTER — Encounter: Payer: Self-pay | Admitting: Neurology

## 2015-05-11 ENCOUNTER — Ambulatory Visit (INDEPENDENT_AMBULATORY_CARE_PROVIDER_SITE_OTHER): Payer: Medicare Other | Admitting: Neurology

## 2015-05-11 ENCOUNTER — Other Ambulatory Visit (INDEPENDENT_AMBULATORY_CARE_PROVIDER_SITE_OTHER): Payer: Medicare Other

## 2015-05-11 VITALS — BP 130/70 | HR 72 | Ht 66.0 in | Wt 182.2 lb

## 2015-05-11 DIAGNOSIS — G609 Hereditary and idiopathic neuropathy, unspecified: Secondary | ICD-10-CM | POA: Insufficient documentation

## 2015-05-11 HISTORY — DX: Hereditary and idiopathic neuropathy, unspecified: G60.9

## 2015-05-11 LAB — TSH: TSH: 3.43 u[IU]/mL (ref 0.35–4.50)

## 2015-05-11 LAB — SEDIMENTATION RATE: Sed Rate: 7 mm/hr (ref 0–22)

## 2015-05-11 LAB — VITAMIN B12: Vitamin B-12: 451 pg/mL (ref 211–911)

## 2015-05-11 NOTE — Patient Instructions (Signed)
1.  Check blood work 2.  If your burning/tingling pain worsens, call my office and we can start you on a medication  [facility]  Preventing Falls in the Home   Falls are common, often dreaded events in the lives of older people. Aside from the obvious injuries and even death that may result, falls can cause wide-ranging consequences including loss of independence, mental decline, decreased activity, and mobility. Younger people are also at risk of falling, especially those with chronic illnesses and fatigue.  Ways to reduce the risk for falling:  * Examine diet and medications. Warm foods and alcohol dilate blood vessels, which can lead to dizziness when standing. Sleep aids, antidepressants, and pain medications can also increase the likelihood of a fall.  * Get a vison exam. Poor vision, cataracts, and glaucoma increase the chances of falling.  * Check foot gear. Shoes should fit snugly and have a sturdy, nonskid sole and broad, low heel.  * Participate in a physician-approved exercise program to build and maintain muscle strength and improve balance and coordination.  * Increase vitamin D intake. Vitamin D improves muscle strength and increases the amount of calcium the body is able to absorb and deposit in bones.  How to prevent falls from common hazards:  * Floors - Remove all loose wires, cords, and throw rugs. Minimize clutter. Make sure rugs are anchored and smooth. Keep furniture in its usual place.  * Chairs - Use chairs with straight backs, armrests, and firm seats. Add firm cushions to existing pieces to add height.  * Bathroom - Install grab bars and non-skid tape in the tub or shower. Use a bathtub transfer bench or a shower chair with a back support. Use an elevated toilet seat and/or safety rails to assist standing from a low surface. Do not use towel racks or bathroom tissue holders to help you stand.  * Lighting - Make sure halls, stairways, and entrances are well-lit.  Install a night light in your bathroom or hallway. Make sure there is a light switch at the top and bottom of the staircase. Turn lights on if you get up in the middle of the night. Make sure lamps or light switches are within reach of the bed if you have to get up during the night.  * Kitchen - Install non-skid rubber mats near the sink and stove. Clean spills immediately. Store frequently used utensils, pots, and pans between waist and eye level. This helps prevent reaching and bending. Sit when getting things out of the lower cupboards.  * Living room / [place] furniture with wide spaces in between, giving enough room to move around. Establish a route through the living room that gives you something to hold onto as you walk.  * Stairs - Make sure treads, rails, and rugs are secure. Install a rail on both sides of the stairs. If stairs are a threat, it might be helpful to arrange most of your activities on the lower level to reduce the number of times you must climb the stairs.  * Entrances and doorways - Install metal handles on the walls adjacent to the doorknobs of all doors to make it more secure as you travel through the doorway.  Tips for maintaining balance:  * Keep at least one hand free at all times Try using a backpack or fanny pack to hold things rather than carrying them in your hands. Never carry objects in both hands when walking as this interferes with  keeping your balance.  * Attempt to swing both arms from front to back while walking. This might require a conscious effort if Parkinson's disease has diminished your movement. It will, however, help you to maintain balance and posture, and reduce fatigue.  * Consciously lift your feet off the ground when walking. Shuffling and dragging of the feet is a common culprit in losing your balance.  * When trying to navigate turns, use a "U" technique of facing forward and making a wide turn, rather than pivoting sharply.  * Try to stand  with your feet shoulder-length apart. When your feet are close together for any length of time, you increase your risk of losing your balance and falling.  * Do one thing at a time. Do not try to walk and accomplish another task, such as reading or looking around. The decrease in your automatic reflexes complicates motor function, so the less distraction, the better.  * Do not wear rubber or gripping soled shoes, they might "catch" on the floor and cause tripping.  * Move slowly when changing positions. Use deliberate, concentrated movements and, if needed, use a grab bar or walking aid. Count fifteen (15) seconds after standing to begin walking.  * If balance is a continuous problem, you might want to consider a walking aid such as a cane, walking stick, or walker. Once you have mastered walking with help, you may be ready to try it again on your own.  This information is provided by [facility] and is not intended to replace the medical advice of your physician or other health care providers. Please consult your physician or other health care providers for advice regarding your specific medical condition.

## 2015-05-11 NOTE — Progress Notes (Signed)
[facility] Clinic Note - Initial Visit   Date: 05/11/2015  LANA FLAIM MRN: [id number] DOB: Nov 04, 1940   Dear Dr. Kelton Pillar:  Thank you for your kind referral of [name] for consultation of neuropathy. Although her history is well known to you, please allow [name] to reiterate it for the purpose of our medical record. The patient was accompanied to the clinic by self.    History of Present Illness: [name] is a 74 y.o. left-handed Caucasian female with GERD, hypothyroidism, stage IA malignant melanoma of the back (2012) s/p excision, idiopathic neuropathy, and GERD  presenting for evaluation of paresthesias of the hands and feet.     She reports having neuropathy since 1990s and was seeing Dr. Erling Cruz at [facility] since 2008.  Symptoms started in her right foot which gradually involved left foot and over the past 2-3 years also involves her fingers bilaterally.  Symptoms are described as numbness, burning, and stinging sensation.  Numbness is at the level of her mid-calf now, which is constant.  She was taking gabapentin 156m at bedtime which helped her sleep but did not provide any relief.  She stopped taking this over a year ago.  She was briefly taking Lyrica, but did not continue due to side effects.   Previously evaluation has included normal serum protein electrophoresis, borderline high TSH, normal RA, ACE , ANA, sedimentation rate, and vitamin B12.  NCS/EMG was also done, but results are unknown.   She does not have any difficulty with balance or weakness.  She walks independently, no recent falls.    No history of diabetes, alcohol use, or chemotherapy.  Her sister also has neuropathy.    She denies any problems with her memory and able to take care of own finances and driving without any safety concerns.  She has a strong family history of Alzheimer's disease.    Out-side paper records, electronic medical record, and images have been reviewed  where available and summarized as:  Peripheral vascular studies 02/13/2015:  Normal ABI  MRI brain and cervical spine 11/10/2014:  No acute infarct.  Remote right frontal lobe infarct with encephalomalacia. Prominent small vessel disease type changes representing significant progression since prior exam. No intracranial mass lesion noted on this unenhanced exam. Cervical spondylotic changes upper cervical spine most prominent C4-5 level.   Past Medical History  Diagnosis Date  . Acid reflux   . Thyroid disease   . [place]  . Weight gain   . Fatigue   . Atypical moles   . Bronchitis   . IBD (inflammatory bowel disease)   . Dizziness   . Retina disorder     left  . LLQ abdominal pain   . Hypothyroidism   . Generalized headache     migraines  . Arthritis     all over  . Anemia     3 years ago  . Insomnia   . Anxiety     in past  . Depression     in past  . GERD (gastroesophageal reflux disease)   . [place]     x2 stg II melanoma  . History of chemotherapy     topical cream for h/o skin CA  . [place]   . Complication of anesthesia     shoulder surgery- not all the way asleep    Past Surgical History  Procedure Laterality Date  . Shoulder surgery      right  .  Knee arthroscopy    . Closed manipulation shoulder      rt  . Rotator cuff repair      left  . Tumor removal      ovary  . Total knee arthroplasty      right  . Melanoma excision with sentinel lymph node biopsy  03/18/11    Back, nodes neg  . Joint replacement      RTK  . Left knee miniscus tear repair    . Colonscopy      May 2014  . Colonoscopy with propofol N/A 11/20/2012    Procedure: COLONOSCOPY WITH PROPOFOL;  Surgeon: Garlan Fair, MD;  Location: [facility];  Service: Endoscopy;  Laterality: N/A;  . Cholecystectomy N/A 01/28/2015    Procedure: LAPAROSCOPIC CHOLECYSTECTOMY WITH INTRAOPERATIVE CHOLANGIOGRAM;  Surgeon: Autumn Messing III, MD;  Location: [facility];   Service: General;  Laterality: N/A;     Medications:  Outpatient Encounter Prescriptions as of 05/11/2015  Medication Sig Note  . albuterol (PROVENTIL HFA;VENTOLIN HFA) 108 (90 BASE) MCG/ACT inhaler Inhale 1-2 puffs into the lungs every 6 (six) hours as needed for wheezing or shortness of breath.   Marland Kitchen aspirin EC 81 MG tablet Take 81 mg by mouth daily.   . Biotin 5000 MCG CAPS Take 5,000 mcg by mouth daily.   . Calcium Carb-Cholecalciferol (CALCIUM 600+D) 600-800 MG-UNIT TABS Take 1 tablet by mouth daily.   . calcium-vitamin D (OSCAL WITH D) 500-200 MG-UNIT per tablet Take 1 tablet by mouth daily.   . Cholecalciferol (VITAMIN D3) 2000 UNITS capsule Take 2,000 Units by mouth daily.   . clotrimazole (LOTRIMIN) 1 % cream Apply 1 application topically at bedtime. 12/21/2014: Received from: External Pharmacy Received Sig:   . Cyanocobalamin (VITAMIN B 12 PO) Take 3,000 mcg by mouth daily.   Marland Kitchen esomeprazole (NEXIUM) 20 MG capsule Take 20 mg by mouth daily at 12 noon.   . fish oil-omega-3 fatty acids 1000 MG capsule Take 1 g by mouth daily.   Marland Kitchen ibuprofen (ADVIL,MOTRIN) 800 MG tablet Take 800 mg by mouth every 8 (eight) hours as needed. 12/21/2014: Received from: External Pharmacy Received Sig:   . levothyroxine (SYNTHROID, LEVOTHROID) 25 MCG tablet Take 25 mcg by mouth every morning. On an empty stomach 12/21/2014: Received from: External Pharmacy Received Sig:   . Omega-3 Fatty Acids (FISH OIL) 500 MG CAPS Take 500 mg by mouth daily.   . Probiotic Product (ADVANCED PROBIOTIC 10) CAPS Take 1 capsule by mouth daily.   Marland Kitchen triamcinolone cream (KENALOG) 0.1 % Apply 1 application topically daily. 12/21/2014: Received from: External Pharmacy Received Sig:   . vitamin B-12 (CYANOCOBALAMIN) 1000 MCG tablet Take 1,000 mcg by mouth daily.   . vitamin C (ASCORBIC ACID) 500 MG tablet Take 500 mg by mouth daily.    No facility-administered encounter medications on file as of 05/11/2015.     Allergies:  Allergies    Allergen Reactions  . Epinephrine Anaphylaxis  . Penicillins Anaphylaxis  . Penicillins Anaphylaxis and Swelling  . Valium Other (See Comments)    Valium " knocks me out"  . Valium [Diazepam] Other (See Comments)    Patient states makes it hard for her to wake up.  Marland Kitchen Epinephrine Palpitations    Family History: Family History  Problem Relation Age of Onset  . Colon polyps Neg Hx   . Liver disease Neg Hx   . Hypertension Mother   . Stroke Mother   . Heart disease Father   .  Diabetes Father   . Hypertension Father   . Hyperlipidemia Father   . Diabetes Sister   . Heart disease Sister     heart murmur  . Heart disease Brother   . Pancreatic cancer Maternal Uncle   . Alzheimer's disease Paternal Aunt   . Colon cancer Maternal Grandfather   . Alzheimer's disease Paternal Aunt   . Alzheimer's disease Paternal Aunt     Social History: Social History  Substance Use Topics  . Smoking status: Never Smoker   . Smokeless tobacco: Never Used  . Alcohol Use: 0.6 oz/week    0 Standard drinks or equivalent, 1 Cans of beer per week     Comment: once a week beer or wine   Social History   Social History Narrative   ** Merged History Encounter **       Patient lives at home alone in a one story home.   Has 3 children.   Retired from [employer]   Caffeine Use: 1 cup daily       Review of Systems:  CONSTITUTIONAL: No fevers, chills, night sweats, or weight loss.   EYES: No visual changes or eye pain ENT: No hearing changes.  No history of nose bleeds.   RESPIRATORY: No cough, wheezing and shortness of breath.   CARDIOVASCULAR: Negative for chest pain, and palpitations.   GI: Negative for abdominal discomfort, blood in stools or black stools.  No recent change in bowel habits.   GU:  No history of incontinence.   MUSCLOSKELETAL: No history of joint pain or swelling.  No myalgias.   SKIN: Negative for lesions, rash, and itching.   HEMATOLOGY/ONCOLOGY: Negative  for prolonged bleeding, bruising easily, and swollen nodes.  No history of cancer.   ENDOCRINE: Negative for cold or heat intolerance, polydipsia or goiter.   PSYCH:  No depression or anxiety symptoms.   NEURO: As Above.   Vital Signs:  BP 130/70 mmHg  Pulse 72  Ht 5' 6"  (1.676 m)  Wt 182 lb 4 oz (82.668 kg)  BMI 29.43 kg/m2  SpO2 97%   General Medical Exam:   General:  Well appearing, comfortable.   Eyes/ENT: see cranial nerve examination.   Neck: No masses appreciated.  Full range of motion without tenderness.  No carotid bruits. Respiratory:  Clear to auscultation, good air entry bilaterally.   Cardiac:  Regular rate and rhythm, no murmur.   Extremities:  No deformities, edema, or skin discoloration.  Skin:  No rashes or lesions.  Neurological Exam: MENTAL STATUS including orientation to time, place, person, recent and remote memory, attention span and concentration, language, and fund of knowledge is normal.  Speech is not dysarthric.  CRANIAL NERVES: II:  No visual field defects.  Unremarkable fundi.   III-IV-VI: Pupils equal round and reactive to light.  Normal conjugate, extra-ocular eye movements in all directions of gaze.  No nystagmus.  No ptosis.   V:  Normal facial sensation.    VII:  Normal facial symmetry and movements.  No pathologic facial reflexes.  VIII:  Normal hearing and vestibular function.   IX-X:  Normal palatal movement.   XI:  Normal shoulder shrug and head rotation.   XII:  Normal tongue strength and range of motion, no deviation or fasciculation.  MOTOR:  No atrophy, fasciculations or abnormal movements.  No pronator drift.  Tone is normal.    Right Upper Extremity:    Left Upper Extremity:    Deltoid  5/5  Deltoid  5/5   Biceps  5/5   Biceps  5/5   Triceps  5/5   Triceps  5/5   Wrist extensors  5/5   Wrist extensors  5/5   Wrist flexors  5/5   Wrist flexors  5/5   Finger extensors  5/5   Finger extensors  5/5   Finger flexors  5/5   Finger  flexors  5/5   Dorsal interossei  5/5   Dorsal interossei  5/5   Abductor pollicis  5/5   Abductor pollicis  5/5   Tone (Ashworth scale)  0  Tone (Ashworth scale)  0   Right Lower Extremity:    Left Lower Extremity:    Hip flexors  5/5   Hip flexors  5/5   Hip extensors  5/5   Hip extensors  5/5   Knee flexors  5/5   Knee flexors  5/5   Knee extensors  5/5   Knee extensors  5/5   Dorsiflexors  5/5   Dorsiflexors  5/5   Plantarflexors  5/5   Plantarflexors  5/5   Toe extensors  5/5   Toe extensors  5/5   Toe flexors  5/5   Toe flexors  5/5   Tone (Ashworth scale)  0  Tone (Ashworth scale)  0   MSRs:  Right                                                                 Left brachioradialis 2+  brachioradialis 2+  biceps 2+  biceps 2+  triceps 2+  triceps 2+  patellar 1+  patellar 1+  ankle jerk 0  ankle jerk 0  Hoffman no  Hoffman no  plantar response down  plantar response down   SENSORY:  Vibration absent at ankles, reduced temperature and pin prick following a gradient pattern distal to mid calf bilaterally.  Sensation is grossly preserved in the upper extremities.  Romberg's sign is present.   COORDINATION/GAIT: Normal finger-to- nose-finger and heel-to-shin.  Intact rapid alternating movements bilaterally.  Able to rise from a chair without using arms.  Gait narrow based and stable. She is unsteady tandem and stressed gait.    IMPRESSION: Idiopathic peripheral neuropathy, diagnosed 1990s, manifesting predominately with paresthesias following a glove-stocking distribution.  Exam does not disclose weakness, but there some sensory ataxia and evidence of large fiber neuropathy.  She has previously had extensive work-up including serology testing and NCS/EMG under the care of Dr. Erling Cruz.  I had extensive discussion with the patient regarding the pathogenesis, etiology, management, and natural course of neuropathy. Neuropathy tends to be slowly progressive, especially if a treatable  etiology is not identified.  For completeness, I will screen for treatable causes of neuropathy.  In the vast majority of cases, despite checking for reversible causes, we are unable to find the underlying etiology and management is symptomatic. She had many questions which I answered to the best of my ability.   I reassured her that her neuropathy should not debilitate her, but may cause greater problems with balance and worsening paresthesias in the future.  We can certainly order balance training as needed going forward.   PLAN/RECOMMENDATIONS:  1.  Check ESR, vitamin B12, copper, zinc, TSH 2.  Medication for  paresthesias declined as symptoms are not very bothersome 3.  Fall precautions discussed and literature provided  Return to clinic in 1 year.   The duration of this appointment visit was 45 minutes of face-to-face time with the patient.  Greater than 50% of this time was spent in counseling, explanation of diagnosis, planning of further management, and coordination of care.   Thank you for allowing me to participate in patient's care.  If I can answer any additional questions, I would be pleased to do so.    Sincerely,    Clemmie Buelna K. Posey Pronto, DO

## 2015-05-11 NOTE — Progress Notes (Signed)
Note routed

## 2015-05-12 ENCOUNTER — Encounter: Payer: Self-pay | Admitting: Gastroenterology

## 2015-05-12 ENCOUNTER — Ambulatory Visit (INDEPENDENT_AMBULATORY_CARE_PROVIDER_SITE_OTHER): Payer: Medicare Other | Admitting: Gastroenterology

## 2015-05-12 VITALS — BP 130/70 | HR 72 | Ht 66.0 in | Wt 183.4 lb

## 2015-05-12 DIAGNOSIS — R194 Change in bowel habit: Secondary | ICD-10-CM

## 2015-05-12 DIAGNOSIS — K219 Gastro-esophageal reflux disease without esophagitis: Secondary | ICD-10-CM

## 2015-05-12 DIAGNOSIS — Z9049 Acquired absence of other specified parts of digestive tract: Secondary | ICD-10-CM

## 2015-05-12 MED ORDER — COLESTIPOL HCL 1 G PO TABS: ORAL_TABLET | ORAL | Status: DC

## 2015-05-12 MED ORDER — RANITIDINE HCL 150 MG PO TABS
150.0000 mg | ORAL_TABLET | Freq: Every day | ORAL | Status: DC
Start: 2015-05-12 — End: 2016-04-11

## 2015-05-12 NOTE — Progress Notes (Signed)
HPI :  74 y/o female here to establish care, seen in consultation for change in bowel habits and GERD.  Patient reports she had her gallbladder removed following gallstone pancreatitis. She had this done in July 2016. She has had change in bowel habits with loose stools since her cholecystectomy. She is having 2-3 BMs per day at times, loose, and then can go a few days until she has BMs again. Her course was complicated by diverticulitis in August, treated with antibiotics. This was her first episode of diverticulitis. She is not seeing blood in her stools. She denies strong urge to have a bowel movement. She denies any signficant abdominal pains, prior abdominal pains resolved, other than some mild lower abdominal cramping with diarrhea. She is eating okay for the most part, had one episode of sporadic vomiting, but she normally does not have this. She has a history of GERD but is not bothering her too much at present. Her main symptoms historically has been pyrosis. She takes nexium 85m daily, for the most part it controls her heartburn, however if she misses a dose she will have symptoms. She denies dysphagia. No weight loss. She denies much gas and bloating.  She has taken some immodium which caused some constipation. She has a family history of dementia and asks about the recently noted association between PPIs and dementia.   [facility] tortous colon per patient with severe abdominal pains following the procedure, although no perforation noted. She denies any polyps being removed and states it was normal.  She was told "I should never have a colonoscopy again due to tortous colon". She reports a prior history of diarrhea atributed to IBS, she thought it would come and go and often related to foods, and seems different than her current symptoms. No new medications. She denies routine NSAID use.   She is otherwise followed for history of peripheral neuropathy,  strong FH of dementia, and also followed for history of melanoma which she reports is in remission.   Past Medical History  Diagnosis Date  . Acid reflux   . Thyroid disease   . [place]  . Weight gain   . Fatigue   . Atypical moles   . Bronchitis   . Dizziness   . Retina disorder     left  . LLQ abdominal pain   . Hypothyroidism   . Generalized headache     migraines  . Arthritis     all over  . Anemia     3 years ago  . Insomnia   . Anxiety     in past  . Depression     in past  . GERD (gastroesophageal reflux disease)   . Cancer ([place]     x2 stg II melanoma  . History of chemotherapy     topical cream for h/o skin CA  . [place]   . Complication of anesthesia     shoulder surgery- not all the way asleep  . Irritable bowel syndrome      Past Surgical History  Procedure Laterality Date  . Shoulder surgery      right  . Knee arthroscopy    . Closed manipulation shoulder      rt  . Rotator cuff repair      left  . Tumor removal      ovary  . Total knee arthroplasty      right  .  Melanoma excision with sentinel lymph node biopsy  03/18/11    Back, nodes neg  . Joint replacement      RTK  . Left knee miniscus tear repair    . Colonscopy      May 2014  . Colonoscopy with propofol N/A 11/20/2012    Procedure: COLONOSCOPY WITH PROPOFOL;  Surgeon: Garlan Fair, MD;  Location: [facility];  Service: Endoscopy;  Laterality: N/A;  . Cholecystectomy N/A 01/28/2015    Procedure: LAPAROSCOPIC CHOLECYSTECTOMY WITH INTRAOPERATIVE CHOLANGIOGRAM;  Surgeon: Autumn Messing III, MD;  Location: [facility];  Service: General;  Laterality: N/A;   Family History  Problem Relation Age of Onset  . Hypertension Mother   . Stroke Mother   . Heart disease Father   . Diabetes Father   . Hypertension Father   . Hyperlipidemia Father   . Diabetes Sister   . Heart disease Sister     heart murmur  . Heart disease Brother   . Pancreatic cancer Maternal  Uncle   . Alzheimer's disease Paternal Aunt   . Colon cancer Maternal Grandfather   . Alzheimer's disease Paternal Aunt   . Alzheimer's disease Paternal Aunt   . Colon polyps Sister     adenomous   Social History  Substance Use Topics  . Smoking status: Never Smoker   . Smokeless tobacco: Never Used  . Alcohol Use: 0.6 oz/week    0 Standard drinks or equivalent, 1 Cans of beer per week     Comment: once a week beer or wine   Current Outpatient Prescriptions  Medication Sig Dispense Refill  . albuterol (PROVENTIL HFA;VENTOLIN HFA) 108 (90 BASE) MCG/ACT inhaler Inhale 1-2 puffs into the lungs every 6 (six) hours as needed for wheezing or shortness of breath.    Marland Kitchen aspirin EC 81 MG tablet Take 81 mg by mouth daily.    . Biotin 5000 MCG CAPS Take 5,000 mcg by mouth daily.    . Calcium Carb-Cholecalciferol (CALCIUM 600+D) 600-800 MG-UNIT TABS Take 1 tablet by mouth daily.    . calcium-vitamin D (OSCAL WITH D) 500-200 MG-UNIT per tablet Take 1 tablet by mouth daily.    . Cholecalciferol (VITAMIN D3) 2000 UNITS capsule Take 2,000 Units by mouth daily.    Marland Kitchen esomeprazole (NEXIUM) 20 MG capsule Take 20 mg by mouth daily at 12 noon.    . fish oil-omega-3 fatty acids 1000 MG capsule Take 1 g by mouth daily.    Marland Kitchen ibuprofen (ADVIL,MOTRIN) 800 MG tablet Take 800 mg by mouth every 8 (eight) hours as needed.  5  . levothyroxine (SYNTHROID, LEVOTHROID) 25 MCG tablet Take 25 mcg by mouth every morning. On an empty stomach  0  . Omega-3 Fatty Acids (FISH OIL) 500 MG CAPS Take 500 mg by mouth daily.    . Probiotic Product (ADVANCED PROBIOTIC 10) CAPS Take 1 capsule by mouth daily.    . vitamin B-12 (CYANOCOBALAMIN) 1000 MCG tablet Take 1,000 mcg by mouth daily.    . vitamin C (ASCORBIC ACID) 500 MG tablet Take 500 mg by mouth daily.    . colestipol (COLESTID) 1 G tablet Take 2 tablets daily. Take separate from other medications. Take 4 hours after levothyroxine. 180 tablet 0  . ranitidine (ZANTAC) 150 MG  tablet Take 1 tablet (150 mg total) by mouth at bedtime. 90 tablet 3   No current facility-administered medications for this visit.   Allergies  Allergen Reactions  . Epinephrine Anaphylaxis  . Penicillins Anaphylaxis  .  Penicillins Anaphylaxis and Swelling  . Valium Other (See Comments)    Valium " knocks me out"  . Valium [Diazepam] Other (See Comments)    Patient states makes it hard for her to wake up.  Marland Kitchen Epinephrine Palpitations     Review of Systems: All systems reviewed and negative except where noted in HPI.   Lab Results  Component Value Date   WBC 5.5 04/13/2015   HGB 13.6 04/13/2015   HCT 40.8 04/13/2015   MCV 90.4 04/13/2015   PLT 211 04/13/2015    Lab Results  Component Value Date   CREATININE 1.0 04/13/2015   BUN 19.2 04/13/2015   NA 144 04/13/2015   K 4.2 04/13/2015   CL 107 01/29/2015   CO2 27 04/13/2015    Lab Results  Component Value Date   ALT 20 04/13/2015   AST 16 04/13/2015   ALKPHOS 70 04/13/2015   BILITOT 1.07 04/13/2015     Physical Exam: BP 130/70 mmHg  Pulse 72  Ht 5' 6"  (1.676 m)  Wt 183 lb 6 oz (83.178 kg)  BMI 29.61 kg/m2 Constitutional: Pleasant,well-developed, female in no acute distress. HEENT: Normocephalic and atraumatic. Conjunctivae are normal. No scleral icterus. Neck supple.  Cardiovascular: Normal rate, regular rhythm.  Pulmonary/chest: Effort normal and breath sounds normal. No wheezing, rales or rhonchi. Abdominal: Soft, nondistended, nontender. Bowel sounds active throughout. There are no masses palpable. No hepatomegaly. Extremities: no edema Lymphadenopathy: No cervical adenopathy noted. Neurological: Alert and oriented to person place and time. Skin: Skin is warm and dry. No rashes noted. Psychiatric: Normal mood and affect. Behavior is normal.   ASSESSMENT AND PLAN: 73 y/o female with history of reported IBS, now s/p cholecystectomy this past summer following an episode of suspected gallstone  pancreatitis, who has had some increased stool frequency with loose stools since her cholecystectomy. Suspect she may have a component of bile salt diarrhea following cholecystectomy, on top of known IBS. Prior colonoscopy in 2014 as reported by the patient was normal. Recent CBC, CMP, ESR, and TSH normal. Recommend a trial of colestid to see if this helps symptoms, she can titrate to effect. She can follow up as needed if symptoms persist. Patient otherwise declines further colon cancer screening at this time given her prior experience in 2014.   Otherwise, patient with history of reflux. No alarm symptoms. She has been on longstanding PPI and asked about the risks of long term PPIs. The risks of long term PPIs with current data include increased risk for chronic kidney disease, increased risk of fracture, increased risk of C diff, increased risk of pneumonia (short term usage), potentially increased risk of dementia, potentially increased risk of B12 / calcium deficiency, and rare risk of hypomagnesemia. I counseled her that several of these associations are only associations and not necessarily causative from PPIs, specifically in regards to her question about dementia, although over time more studies are needed to clarify this issue. Renal function is currently normal and would recommend checking this yearly while on PPI therapy. Otherwise, given her family history of dementia and concern about this in the future, recommend we try switching from nexium 57m daily to zantac 1560mBID. If her symptoms are poorly controlled with zantac, she can take nexium as needed, or lowest daily dose needed to control symptoms. She can follow up as needed for this issue if zantac is not working well.   StCarolina CellarMD [facility] Pager [phone]

## 2015-05-12 NOTE — Patient Instructions (Signed)
We have sent medications to your pharmacy for you to pick up at your convenience   

## 2015-05-14 LAB — COPPER, SERUM: Copper: 93 ug/dL (ref 70–175)

## 2015-05-14 LAB — ZINC: Zinc: 72 ug/dL (ref 60–130)

## 2015-05-20 ENCOUNTER — Telehealth: Payer: Self-pay | Admitting: Gastroenterology

## 2015-05-20 NOTE — Telephone Encounter (Signed)
Spoke with patient and she has been taking Zantac daily and it is not working. Per OV note, she can take it BID. She will try this and see if it helps her.

## 2015-05-20 NOTE — Telephone Encounter (Signed)
Agreed she should take it BID. If it does not help, it is up to her if she wishes to go back on PPI. We had a lengthy discussion about this in clinic. She can call back for further advice if needed. Thanks

## 2015-07-06 ENCOUNTER — Telehealth: Payer: Self-pay | Admitting: Gastroenterology

## 2015-07-06 NOTE — Telephone Encounter (Signed)
Yes, she can start back on nexium if her symptoms are not well controlled on zantac. Her preference during clinic visit to was to come off it if possible, however if she is miserable due to symptoms, recommend the lowest daily dose needed. She can try 20mg  nexium daily and see how that goes. If she needs a higher dose she can contact us. Thanks

## 2015-07-06 NOTE — Telephone Encounter (Signed)
Spoke with patient and Zantac BID is not helping. She is still having burning all the way down. Tums and Pepto bismol is not helping either.She has vomited several times. She is asking if she can go back on Nexium. She has the OTC Nexium on hand. Should she start with that or 40 mg? Please, advise.

## 2015-07-06 NOTE — Telephone Encounter (Signed)
Patient given recommendations. She will call back if this does not help her symptoms.

## 2015-07-23 DIAGNOSIS — H35372 Puckering of macula, left eye: Secondary | ICD-10-CM | POA: Diagnosis not present

## 2015-07-23 DIAGNOSIS — H43811 Vitreous degeneration, right eye: Secondary | ICD-10-CM | POA: Insufficient documentation

## 2015-07-23 DIAGNOSIS — H2513 Age-related nuclear cataract, bilateral: Secondary | ICD-10-CM | POA: Diagnosis not present

## 2015-07-27 DIAGNOSIS — R42 Dizziness and giddiness: Secondary | ICD-10-CM | POA: Diagnosis not present

## 2015-08-04 DIAGNOSIS — R42 Dizziness and giddiness: Secondary | ICD-10-CM | POA: Diagnosis not present

## 2015-08-08 ENCOUNTER — Observation Stay (HOSPITAL_COMMUNITY)
Admission: EM | Admit: 2015-08-08 | Discharge: 2015-08-10 | Disposition: A | Discharge status: Home / Self Care | Payer: PPO | Attending: Internal Medicine | Admitting: Internal Medicine

## 2015-08-08 ENCOUNTER — Emergency Department (HOSPITAL_COMMUNITY): Payer: PPO

## 2015-08-08 ENCOUNTER — Encounter (HOSPITAL_COMMUNITY): Payer: Self-pay | Admitting: Emergency Medicine

## 2015-08-08 DIAGNOSIS — Z79899 Other long term (current) drug therapy: Secondary | ICD-10-CM | POA: Insufficient documentation

## 2015-08-08 DIAGNOSIS — M1990 Unspecified osteoarthritis, unspecified site: Secondary | ICD-10-CM | POA: Insufficient documentation

## 2015-08-08 DIAGNOSIS — F419 Anxiety disorder, unspecified: Secondary | ICD-10-CM | POA: Diagnosis not present

## 2015-08-08 DIAGNOSIS — G629 Polyneuropathy, unspecified: Secondary | ICD-10-CM | POA: Insufficient documentation

## 2015-08-08 DIAGNOSIS — R42 Dizziness and giddiness: Secondary | ICD-10-CM | POA: Diagnosis not present

## 2015-08-08 DIAGNOSIS — Z85820 Personal history of malignant melanoma of skin: Secondary | ICD-10-CM | POA: Diagnosis not present

## 2015-08-08 DIAGNOSIS — C439 Malignant melanoma of skin, unspecified: Secondary | ICD-10-CM | POA: Diagnosis not present

## 2015-08-08 DIAGNOSIS — F329 Major depressive disorder, single episode, unspecified: Secondary | ICD-10-CM | POA: Insufficient documentation

## 2015-08-08 DIAGNOSIS — M79605 Pain in left leg: Secondary | ICD-10-CM

## 2015-08-08 DIAGNOSIS — K219 Gastro-esophageal reflux disease without esophagitis: Secondary | ICD-10-CM | POA: Diagnosis not present

## 2015-08-08 DIAGNOSIS — R0789 Other chest pain: Secondary | ICD-10-CM | POA: Insufficient documentation

## 2015-08-08 DIAGNOSIS — R55 Syncope and collapse: Secondary | ICD-10-CM | POA: Diagnosis not present

## 2015-08-08 DIAGNOSIS — H359 Unspecified retinal disorder: Secondary | ICD-10-CM | POA: Insufficient documentation

## 2015-08-08 DIAGNOSIS — C4359 Malignant melanoma of other part of trunk: Secondary | ICD-10-CM | POA: Diagnosis present

## 2015-08-08 DIAGNOSIS — D649 Anemia, unspecified: Secondary | ICD-10-CM | POA: Diagnosis not present

## 2015-08-08 DIAGNOSIS — G4700 Insomnia, unspecified: Secondary | ICD-10-CM | POA: Insufficient documentation

## 2015-08-08 DIAGNOSIS — M79662 Pain in left lower leg: Secondary | ICD-10-CM | POA: Diagnosis not present

## 2015-08-08 DIAGNOSIS — R110 Nausea: Secondary | ICD-10-CM | POA: Insufficient documentation

## 2015-08-08 DIAGNOSIS — Z7982 Long term (current) use of aspirin: Secondary | ICD-10-CM | POA: Diagnosis not present

## 2015-08-08 DIAGNOSIS — D239 Other benign neoplasm of skin, unspecified: Secondary | ICD-10-CM | POA: Insufficient documentation

## 2015-08-08 DIAGNOSIS — E079 Disorder of thyroid, unspecified: Secondary | ICD-10-CM | POA: Insufficient documentation

## 2015-08-08 DIAGNOSIS — E039 Hypothyroidism, unspecified: Secondary | ICD-10-CM | POA: Diagnosis present

## 2015-08-08 DIAGNOSIS — R350 Frequency of micturition: Secondary | ICD-10-CM | POA: Diagnosis present

## 2015-08-08 DIAGNOSIS — G9389 Other specified disorders of brain: Secondary | ICD-10-CM | POA: Diagnosis not present

## 2015-08-08 DIAGNOSIS — Z880 Allergy status to penicillin: Secondary | ICD-10-CM | POA: Diagnosis not present

## 2015-08-08 DIAGNOSIS — K589 Irritable bowel syndrome without diarrhea: Secondary | ICD-10-CM | POA: Diagnosis not present

## 2015-08-08 LAB — BASIC METABOLIC PANEL
Anion gap: 12 (ref 5–15)
BUN: 22 mg/dL — ABNORMAL HIGH (ref 6–20)
CO2: 25 mmol/L (ref 22–32)
Calcium: 9.4 mg/dL (ref 8.9–10.3)
Chloride: 104 mmol/L (ref 101–111)
Creatinine, Ser: 0.94 mg/dL (ref 0.44–1.00)
GFR calc Af Amer: >60 mL/min (ref >60)
GFR calc non Af Amer: 58 mL/min — ABNORMAL LOW (ref >60)
Glucose, Bld: 110 mg/dL — ABNORMAL HIGH (ref 65–99)
Potassium: 4.4 mmol/L (ref 3.5–5.1)
Sodium: 141 mmol/L (ref 135–145)

## 2015-08-08 LAB — CBC
HCT: 41.8 % (ref 36.0–46.0)
Hemoglobin: 13.7 g/dL (ref 12.0–15.0)
MCH: 30 pg (ref 26.0–34.0)
MCHC: 32.8 g/dL (ref 30.0–36.0)
MCV: 91.5 fL (ref 78.0–100.0)
Platelets: 237 10*3/uL (ref 150–400)
RBC: 4.57 MIL/uL (ref 3.87–5.11)
RDW: 13.3 % (ref 11.5–15.5)
WBC: 6.1 10*3/uL (ref 4.0–10.5)

## 2015-08-08 LAB — I-STAT TROPONIN, ED: Troponin i, poc: 0 ng/mL (ref 0.00–0.08)

## 2015-08-08 LAB — BRAIN NATRIURETIC PEPTIDE: B Natriuretic Peptide: 20.6 pg/mL (ref 0.0–100.0)

## 2015-08-08 MED ORDER — GADOBENATE DIMEGLUMINE 529 MG/ML IV SOLN
15.0000 mL | Freq: Once | INTRAVENOUS | Status: AC | PRN
  Administered 2015-08-08: 15 mL via INTRAVENOUS

## 2015-08-08 NOTE — ED Notes (Signed)
Pt sts near syncope x 1 month worse today with 4 near syncopal episodes

## 2015-08-08 NOTE — ED Notes (Signed)
Pt back from x-ray.

## 2015-08-08 NOTE — ED Notes (Signed)
EDP at bedside  

## 2015-08-08 NOTE — ED Notes (Signed)
Pt to xray

## 2015-08-08 NOTE — ED Notes (Signed)
Pt updated by MD on plan of care.

## 2015-08-08 NOTE — ED Provider Notes (Signed)
CSN: [id number]     Arrival date & time 08/08/15  1633 History   First MD Initiated Contact with Patient 08/08/15 1702     Chief Complaint  Patient presents with  . Near Syncope     (Consider location/radiation/quality/duration/timing/severity/associated sxs/prior Treatment) HPI Patient has a history of malignant melanoma not currently being treated. She presents with one month of dizziness episodes. States they are worse when lying down. Had an episode at 2:30 this afternoon. Thinks she may have had brief loss of consciousness. States she is feeling much better at this time. The symptoms are worsened with turning her head. She has occasional nausea but denies any currently. States her body feels "heavy". Denies any visual changes. Per family member patient has been unable to walk straight. She gets shortness of breath with mild exertion. She's been seen to by your nose and throat M.D. for the same symptoms. She is told that this was not vertigo and she needs to see a cardiologist. She's also been seen by an ophthalmologist and told this was not related to her eyes. Past Medical History  Diagnosis Date  . Acid reflux   . Thyroid disease   . Melanoma ([place]) 2013  . Weight gain   . Fatigue   . Atypical moles   . Bronchitis   . Dizziness   . Retina disorder     left  . LLQ abdominal pain   . Hypothyroidism   . Generalized headache     migraines  . Arthritis     all over  . Anemia     3 years ago  . Insomnia   . Anxiety     in past  . Depression     in past  . GERD (gastroesophageal reflux disease)   . Cancer ([place])     x2 stg II melanoma  . History of chemotherapy     topical cream for h/o skin CA  . Neuropathy, peripheral ([place])   . Complication of anesthesia     shoulder surgery- not all the way asleep  . Irritable bowel syndrome    Past Surgical History  Procedure Laterality Date  . Shoulder surgery      right  . Knee arthroscopy    . Closed manipulation shoulder      rt  . Rotator cuff repair      left  . Tumor removal      ov[place]  . Total knee arthroplasty      right  . Melanoma excision with sentinel lymph node biopsy  03/18/11    Back, nodes neg  . Joint replacement      RTK  . Left knee miniscus tear repair    . Colonscopy      May 2014  . Colonoscopy with propofol N/A 11/20/2012    Procedure: COLONOSCOPY WITH PROPOFOL;  Surgeon: Garlan Fair, MD;  Location: [facility];  Service: Endoscopy;  Laterality: N/A;  . Cholecystectomy N/A 01/28/2015    Procedure: LAPAROSCOPIC CHOLECYSTECTOMY WITH INTRAOPERATIVE CHOLANGIOGRAM;  Surgeon: Autumn Messing III, MD;  Location: [facility];  Service: General;  Laterality: N/A;   Family History  Problem Relation Age of Onset  . Hypertension Mother   . Stroke Mother   . Heart disease Father   . Diabetes Father   . Hypertension Father   . Hyperlipidemia Father   . Diabetes Sister   . Heart disease Sister     heart murmur  . Heart disease Brother   . Pancreatic  cancer Maternal Uncle   . Alzheimer's disease Paternal Aunt   . Colon cancer Maternal Grandfather   . Alzheimer's disease Paternal Aunt   . Alzheimer's disease Paternal Aunt   . Colon polyps Sister     adenomous   Social History  Substance Use Topics  . Smoking status: Never Smoker   . Smokeless tobacco: Never Used  . Alcohol Use: 0.6 oz/week    0 Standard drinks or equivalent, 1 Cans of beer per week     Comment: once a week beer or wine   OB History    Gravida Para Term Preterm AB TAB SAB Ectopic Multiple Living   0 0 0 0 0 0 0 0       Review of Systems  Constitutional: Negative for fever and chills.  HENT: Negative for ear pain.   Eyes: Negative for visual disturbance.  Respiratory: Positive for chest tightness. Negative for shortness of breath.   Cardiovascular: Negative for chest pain, palpitations and leg swelling.  Gastrointestinal: Positive for nausea. Negative for vomiting, abdominal pain and diarrhea.  Musculoskeletal: Negative  for myalgias, back pain, neck pain and neck stiffness.  Skin: Negative for rash and wound.  Neurological: Positive for dizziness, syncope and light-headedness. Negative for weakness, numbness and headaches.  All other systems reviewed and are negative.     Allergies  Epinephrine; Penicillins; and Valium  Home Medications   Prior to Admission medications   Medication Sig Start Date End Date Taking? Authorizing Provider  acetaminophen (TYLENOL) 500 MG tablet Take 500 mg by mouth every 6 (six) hours as needed (pain/migraine).   Yes Historical Provider, MD  albuterol (PROVENTIL HFA;VENTOLIN HFA) 108 (90 BASE) MCG/ACT inhaler Inhale 1-2 puffs into the lungs every 6 (six) hours as needed for wheezing or shortness of breath.   Yes Historical Provider, MD  Ascorbic Acid (VITAMIN C) 1000 MG tablet Take 1,000 mg by mouth daily.   Yes Historical Provider, MD  aspirin EC 81 MG tablet Take 81 mg by mouth daily.   Yes Historical Provider, MD  Biotin 5000 MCG CAPS Take 5,000 mcg by mouth daily.   Yes Historical Provider, MD  CALCIUM PO Take 500 mg by mouth daily.   Yes Historical Provider, MD  Cholecalciferol (VITAMIN D3) 2000 UNITS capsule Take 2,000 Units by mouth daily.   Yes Historical Provider, MD  clindamycin (CLEOCIN) 300 MG capsule Take 600-1,200 mg by mouth See admin instructions. Take 2 -4 capsules [phone] mg) by mouth one hour prior to dental appointment   Yes Historical Provider, MD  esomeprazole (NEXIUM) 20 MG capsule Take 20 mg by mouth daily.    Yes Historical Provider, MD  levothyroxine (SYNTHROID, LEVOTHROID) 25 MCG tablet Take 25 mcg by mouth daily before breakfast. On an empty stomach 11/26/14  Yes Historical Provider, MD  MELATONIN PO Take 1-2 tablets by mouth at bedtime as needed (sleep).   Yes Historical Provider, MD  Omega-3 Fatty Acids (FISH OIL) 1200 MG CAPS Take 1,200 mg by mouth 3 (three) times daily.   Yes Historical Provider, MD  Probiotic Product (PROBIOTIC PO) Take 1  tablet by mouth daily.   Yes Historical Provider, MD  vitamin B-12 (CYANOCOBALAMIN) 1000 MCG tablet Take 1,000 mcg by mouth daily.   Yes Historical Provider, MD  colestipol (COLESTID) 1 G tablet Take 2 tablets daily. Take separate from other medications. Take 4 hours after levothyroxine. Patient not taking: Reported on 08/08/2015 05/12/15   Manus Gunning, MD  ranitidine (ZANTAC) 150 MG tablet  Take 1 tablet (150 mg total) by mouth at bedtime. Patient not taking: Reported on 08/08/2015 05/12/15   Manus Gunning, MD   BP 131/70 mmHg  Pulse 71  Temp(Src) 98.5 F (36.9 C) (Oral)  Resp 14  SpO2 97% Physical Exam  Constitutional: She is oriented to person, place, and time. She appears well-developed and well-nourished. No distress.  HENT:  Head: Normocephalic and atraumatic.  Mouth/Throat: Oropharynx is clear and moist. No oropharyngeal exudate.  Opaque right TM compared to left.  Eyes: EOM are normal. Pupils are equal, round, and reactive to light.  Rotary nystagmus but appears fatigable  Neck: Normal range of motion. Neck supple.  No meningismus. Symptoms exacerbated with turning head to right and left  Cardiovascular: Normal rate and regular rhythm.  Exam reveals no gallop and no friction rub.   No murmur heard. Pulmonary/Chest: Effort normal and breath sounds normal. No stridor. No respiratory distress. She has no wheezes. She has no rales. She exhibits no tenderness.  Abdominal: Soft. Bowel sounds are normal. She exhibits no distension and no mass. There is no tenderness. There is no rebound and no guarding.  Musculoskeletal: Normal range of motion. She exhibits no edema or tenderness.  2+ left dorsalis pedis and posterior tibial. 1+ right dorsalis pedis and 2+ posterior tibial. No calf swelling or tenderness. No midline thoracic or lumbar tenderness.  Neurological: She is alert and oriented to person, place, and time.  Bilateral finger to nose and heel to shin are normal.  Patient has normal sensation throughout. Cranial nerves intact. 5/5 bilateral upper and right lower extremity. 4/5 left lower extremity. Drift is noted. Patient takes she feels weaker in the left leg. Unsure onset.  Skin: Skin is warm and dry. No rash noted. No erythema.  Psychiatric: She has a normal mood and affect. Her behavior is normal.  Nursing note and vitals reviewed.   ED Course  Procedures (including critical care time) Labs Review Labs Reviewed  BASIC METABOLIC PANEL - Abnormal; Notable for the following:    Glucose, Bld 110 (*)    BUN 22 (*)    GFR calc non Af Amer 58 (*)    All other components within normal limits  CBC  BRAIN NATRIURETIC PEPTIDE  I-STAT TROPOININ, ED    Imaging Review Dg Chest 2 View  08/08/2015  CLINICAL DATA:  Near syncope for 1 month worse today EXAM: CHEST  2 VIEW COMPARISON:  12/21/2014 FINDINGS: The heart size and vascular pattern are normal. No consolidation or effusion. There is evidence of postsurgical change over the left axilla which is not seen on the prior study. There is also a 3 mm and a 6 mm nodular opacity in the right apex which are not seen previously. No pleural effusions. IMPRESSION: No acute findings. However, 2 nodular opacities in the right lung apex are noted. These are not seen on prior radiograph or CT scan performed 12/21/2014. Consider unenhanced CT thorax to further evaluate. Electronically Signed   By: Skipper Cliche M.D.   On: 08/08/2015 17:52   Mr Brain Wo Contrast  08/08/2015  CLINICAL DATA:  75 year old female with left leg weakness and dizziness. Melanoma post chemotherapy. Initial encounter. EXAM: MRI HEAD WITHOUT CONTRAST TECHNIQUE: Multiplanar, multiecho pulse sequences of the brain and surrounding structures were obtained without intravenous contrast. COMPARISON:  11/10/2014 MR. FINDINGS: No acute infarct or intracranial hemorrhage. Remote right frontal lobe infarct with encephalomalacia. Prominent small vessel disease  type changes minimally changed. No intracranial mass lesion noted  however, to rule out metastatic disease, contrast-enhanced imaging would be necessary. Change with sclerotic appearance of the right mandibular condyle may represent degenerative changes/ osteonecrosis with joint fluid. Metastatic disease secondary less likely consideration. 3 mm lesion of indeterminate etiology. Small metastatic focus not excluded. Cervical spondylotic changes with spinal stenosis C3-4 thru C5-6 with mild cord flattening C5-6. Major intracranial vascular structures are patent. No orbital, pituitary or pineal abnormality noted. IMPRESSION: No acute infarct or intracranial hemorrhage. Remote right frontal lobe infarct with encephalomalacia. Prominent small vessel disease type changes minimally changed. No intracranial mass lesion noted however, to rule out metastatic disease, contrast-enhanced imaging would be necessary. Change with sclerotic appearance of the right mandibular condyle may represent degenerative changes/ osteonecrosis with joint fluid. Metastatic disease secondary less likely consideration. 3 mm lesion of indeterminate etiology. Small metastatic focus not excluded. Cervical spondylotic changes with spinal stenosis C3-4 thru C5-6 with mild cord flattening C5-6. Electronically Signed   By: Genia Del M.D.   On: 08/08/2015 19:30   Mr Jeri Cos Contrast  08/08/2015  CLINICAL DATA:  Initial evaluation for vertigo, left leg weakness. EXAM: MRI HEAD WITH CONTRAST TECHNIQUE: Multiplanar, multiecho pulse sequences of the brain and surrounding structures were obtained with intravenous contrast. COMPARISON:  Prior noncontrast brain MRI performed earlier on the same day. CONTRAST:  60mL MULTIHANCE GADOBENATE DIMEGLUMINE 529 MG/ML IV SOLN FINDINGS: Postcontrast imaging of the brain demonstrates no worrisome abnormal enhancement or mass lesion. Previously noted indeterminate 3 mm lesion, presumably osseous in nature at the dens,  demonstrates homogeneous postcontrast enhancement (series 3, image 11 on sagittal sequence, series 4, image 2 on axial sequence). This is favored to be degenerative in nature given its somewhat wedge like morphology and subcortical location. Additional chronic changes as previously described on noncontrast brain MRI performed earlier the same day. IMPRESSION: 1. No intracranial mass lesion or abnormal enhancement. 2. Previously noted indeterminate 3 mm lesion at the tip of the dens demonstrates homogeneous postcontrast enhancement. Given the location and morphology of this lesion, this is favored to be degenerative in in nature. However, given the history of melanoma, a short interval follow-up study in 3 months is recommended to document stability to ensure no underlying metastatic lesion is present. Electronically Signed   By: Jeannine Boga M.D.   On: 08/08/2015 23:43   I have personally reviewed and evaluated these images and lab results as part of my medical decision-making.   EKG Interpretation None      MDM   Final diagnoses:  Syncope, unspecified syncope type    Patient with history of melanoma presents with dizziness for the past month. Unable to describe the dizziness but states she feels lightheaded. She denies any room spinning dizziness. However the symptoms aren't worsened with turning her head and lying flat. Questionable brief loss of consciousness. Patient also appears to have a left lower extremity weakness compared to her right. Patient was unaware of this weakness until examined.  Will get MRI brain to rule out posterior lesion.  MRI brain with 3 mm lesion of indeterminate etiology. Discuss with Dr. Nicole Kindred who recommended MRI with contrast. Family is updated.  Discussed with Dr. Tamala Julian. Will admit to telemetry bed for observation. MRI with contrast reveals that the previously imaged lesion is likely degenerative in nature. However given history of metastatic disease  reevaluation is recommended.  Julianne Rice, MD 08/08/15 2351

## 2015-08-09 ENCOUNTER — Observation Stay (HOSPITAL_COMMUNITY): Payer: PPO

## 2015-08-09 ENCOUNTER — Observation Stay (HOSPITAL_BASED_OUTPATIENT_CLINIC_OR_DEPARTMENT_OTHER): Payer: PPO

## 2015-08-09 DIAGNOSIS — M79605 Pain in left leg: Secondary | ICD-10-CM | POA: Diagnosis not present

## 2015-08-09 DIAGNOSIS — C4359 Malignant melanoma of other part of trunk: Secondary | ICD-10-CM

## 2015-08-09 DIAGNOSIS — E039 Hypothyroidism, unspecified: Secondary | ICD-10-CM | POA: Diagnosis not present

## 2015-08-09 DIAGNOSIS — R55 Syncope and collapse: Secondary | ICD-10-CM | POA: Diagnosis not present

## 2015-08-09 DIAGNOSIS — R42 Dizziness and giddiness: Secondary | ICD-10-CM | POA: Diagnosis not present

## 2015-08-09 DIAGNOSIS — R350 Frequency of micturition: Secondary | ICD-10-CM | POA: Diagnosis present

## 2015-08-09 LAB — URINALYSIS, ROUTINE W REFLEX MICROSCOPIC
Bilirubin Urine: NEGATIVE
Glucose, UA: NEGATIVE mg/dL
Hgb urine dipstick: NEGATIVE
Ketones, ur: NEGATIVE mg/dL
Nitrite: NEGATIVE
Protein, ur: NEGATIVE mg/dL
Specific Gravity, Urine: 1.017 (ref 1.005–1.030)
pH: 6.5 (ref 5.0–8.0)

## 2015-08-09 LAB — BASIC METABOLIC PANEL
Anion gap: 7 (ref 5–15)
BUN: 17 mg/dL (ref 6–20)
CO2: 27 mmol/L (ref 22–32)
Calcium: 8.9 mg/dL (ref 8.9–10.3)
Chloride: 108 mmol/L (ref 101–111)
Creatinine, Ser: 0.95 mg/dL (ref 0.44–1.00)
GFR calc Af Amer: >60 mL/min (ref >60)
GFR calc non Af Amer: 58 mL/min — ABNORMAL LOW (ref >60)
Glucose, Bld: 113 mg/dL — ABNORMAL HIGH (ref 65–99)
Potassium: 3.9 mmol/L (ref 3.5–5.1)
Sodium: 142 mmol/L (ref 135–145)

## 2015-08-09 LAB — T4, FREE: Free T4: 0.94 ng/dL (ref 0.61–1.12)

## 2015-08-09 LAB — D-DIMER, QUANTITATIVE (NOT AT ARMC): D-Dimer, Quant: 0.52 ug/mL-FEU — ABNORMAL HIGH (ref 0.00–0.50)

## 2015-08-09 LAB — URINE MICROSCOPIC-ADD ON

## 2015-08-09 LAB — TSH: TSH: 6.663 u[IU]/mL — ABNORMAL HIGH (ref 0.350–4.500)

## 2015-08-09 LAB — OSMOLALITY: Osmolality: 296 mOsm/kg — ABNORMAL HIGH (ref 275–295)

## 2015-08-09 LAB — OSMOLALITY, URINE: Osmolality, Ur: 603 mOsm/kg (ref 300–900)

## 2015-08-09 LAB — CORTISOL-AM, BLOOD: Cortisol - AM: 5.5 ug/dL — ABNORMAL LOW (ref 6.7–22.6)

## 2015-08-09 MED ORDER — MELATONIN 1 MG PO CAPS: ORAL_CAPSULE | Freq: Every evening | ORAL | Status: DC | PRN

## 2015-08-09 MED ORDER — PANTOPRAZOLE SODIUM 40 MG PO TBEC
40.0000 mg | DELAYED_RELEASE_TABLET | Freq: Every day | ORAL | Status: DC
  Administered 2015-08-09 – 2015-08-10 (×2): 40 mg via ORAL
  Filled 2015-08-09 (×2): qty 1

## 2015-08-09 MED ORDER — SODIUM CHLORIDE 0.9 % IV BOLUS (SEPSIS): 1000.0000 mL | INTRAVENOUS | Status: AC

## 2015-08-09 MED ORDER — ACETAMINOPHEN 650 MG RE SUPP: 650.0000 mg | Freq: Four times a day (QID) | RECTAL | Status: DC | PRN

## 2015-08-09 MED ORDER — VITAMIN C 500 MG PO TABS
1000.0000 mg | ORAL_TABLET | Freq: Every day | ORAL | Status: DC
  Administered 2015-08-09 – 2015-08-10 (×2): 1000 mg via ORAL
  Filled 2015-08-09 (×2): qty 2

## 2015-08-09 MED ORDER — SACCHAROMYCES BOULARDII 250 MG PO CAPS
250.0000 mg | ORAL_CAPSULE | Freq: Every day | ORAL | Status: DC
  Administered 2015-08-09 – 2015-08-10 (×2): 250 mg via ORAL
  Filled 2015-08-09 (×2): qty 1

## 2015-08-09 MED ORDER — LEVOTHYROXINE SODIUM 25 MCG PO TABS
25.0000 ug | ORAL_TABLET | Freq: Every day | ORAL | Status: DC
  Administered 2015-08-09: 25 ug via ORAL
  Filled 2015-08-09: qty 1

## 2015-08-09 MED ORDER — ONDANSETRON HCL 4 MG/2ML IJ SOLN: 4.0000 mg | Freq: Four times a day (QID) | INTRAMUSCULAR | Status: DC | PRN

## 2015-08-09 MED ORDER — ONDANSETRON HCL 4 MG PO TABS: 4.0000 mg | ORAL_TABLET | Freq: Four times a day (QID) | ORAL | Status: DC | PRN

## 2015-08-09 MED ORDER — ALBUTEROL SULFATE (2.5 MG/3ML) 0.083% IN NEBU: 2.5000 mg | INHALATION_SOLUTION | RESPIRATORY_TRACT | Status: DC | PRN

## 2015-08-09 MED ORDER — LEVOTHYROXINE SODIUM 50 MCG PO TABS
50.0000 ug | ORAL_TABLET | Freq: Every day | ORAL | Status: DC
  Administered 2015-08-10: 50 ug via ORAL
  Filled 2015-08-09: qty 1

## 2015-08-09 MED ORDER — SODIUM CHLORIDE 0.9 % IV SOLN
INTRAVENOUS | Status: DC
  Administered 2015-08-09: 13:00:00 via INTRAVENOUS

## 2015-08-09 MED ORDER — ACETAMINOPHEN 500 MG PO TABS: 500.0000 mg | ORAL_TABLET | Freq: Four times a day (QID) | ORAL | Status: DC | PRN

## 2015-08-09 MED ORDER — COSYNTROPIN 0.25 MG IJ SOLR
0.2500 mg | Freq: Once | INTRAMUSCULAR | Status: AC
  Administered 2015-08-10: 0.25 mg via INTRAVENOUS
  Filled 2015-08-09: qty 0.25

## 2015-08-09 MED ORDER — ASPIRIN EC 81 MG PO TBEC
81.0000 mg | DELAYED_RELEASE_TABLET | Freq: Every day | ORAL | Status: DC
  Administered 2015-08-09 – 2015-08-10 (×2): 81 mg via ORAL
  Filled 2015-08-09 (×2): qty 1

## 2015-08-09 MED ORDER — VITAMIN B-12 1000 MCG PO TABS
1000.0000 ug | ORAL_TABLET | Freq: Every day | ORAL | Status: DC
  Administered 2015-08-09 – 2015-08-10 (×2): 1000 ug via ORAL
  Filled 2015-08-09 (×2): qty 1

## 2015-08-09 MED ORDER — VITAMIN D 1000 UNITS PO TABS
2000.0000 [IU] | ORAL_TABLET | Freq: Every day | ORAL | Status: DC
  Administered 2015-08-09 – 2015-08-10 (×2): 2000 [IU] via ORAL
  Filled 2015-08-09 (×2): qty 2

## 2015-08-09 MED ORDER — ENOXAPARIN SODIUM 40 MG/0.4ML ~~LOC~~ SOLN
40.0000 mg | SUBCUTANEOUS | Status: DC
  Administered 2015-08-09 – 2015-08-10 (×2): 40 mg via SUBCUTANEOUS
  Filled 2015-08-09 (×2): qty 0.4

## 2015-08-09 MED ORDER — ACETAMINOPHEN 325 MG PO TABS: 650.0000 mg | ORAL_TABLET | Freq: Four times a day (QID) | ORAL | Status: DC | PRN

## 2015-08-09 NOTE — H&P (Signed)
[facility] History and Physical  [name] [id number] DOB: Apr 16, 1941 DOA: 08/08/2015  Referring physician: ED  PCP: Lottie Dawson, MD   Chief Complaint: Syncope  HPI:  [name] is a 75 year old female with a past medical history significant for melanoma stage 2 s/p resection, migraines, hypothyroidism, gerd; who presents with complaints of syncope. Patient noticed that symptoms have been going on for the last month where she reports these "DIZZY spells". Patient states symptoms can be experienced while walking, sitting, or lying down. She reports having multiple such episodes prior to coming while laying on her couch this afternoon around 2:30 PM. She reports she may have lost consciousness for a brief moment in time. Symptoms appear to be worse with turning of her head. Associated symptoms include intermittent nausea, "heaviness" feeling, gait abnormality, intermittent palpitations, fatigue, shortness of breath with exertion, lower leg swelling. She was evaluated by her primary care provider, who sent her to ENT to be evaluated for vertigo. Patient reports being seen in told by ENT that she did not have vertigo. Patient also reports going to her ophthalmologist for further evaluation as well but told everything was fine. Patient cannot recall when her last thyroid levels were checked. She notes that she takes for levothyroxine in the morning with all of her vitamins. Patient also describes left leg pain and new onset swelling.   Patient was evaluated with a CT scan of the head showed no acute abnormalities, then subsequent MRI with without contrast initially showed a question of a 3 mm lesion that gave concern for the possibility of metastatic melanoma for which a MRI without contrast was obtained. It is recommended to follow-up in 3 months. Chest x-ray showed 2 nodules of concern and patient notes that she was advised about recurrence of melanoma likely being in the  lung.   Review of Systems  Constitutional: Positive for malaise/fatigue. Negative for chills and diaphoresis.  HENT: Negative for ear discharge, hearing loss and tinnitus.   Eyes: Negative for double vision and photophobia.  Respiratory: Positive for shortness of breath. Negative for cough and hemoptysis.   Cardiovascular: Positive for chest pain, palpitations and leg swelling.  Gastrointestinal: Negative for abdominal pain and constipation.  Genitourinary: Positive for frequency. Negative for dysuria.  Musculoskeletal: Negative for falls.  Skin: Negative for itching and rash.  Neurological: Positive for dizziness, loss of consciousness and weakness.  Endo/Heme/Allergies: Positive for polydipsia. Negative for environmental allergies. Does not bruise/bleed easily.       Past Medical History  Diagnosis Date  . Acid reflux   . Thyroid disease   . Melanoma ([place]) 2013  . Weight gain   . Fatigue   . Atypical moles   . Bronchitis   . Dizziness   . Retina disorder     left  . LLQ abdominal pain   . Hypothyroidism   . Generalized headache     migraines  . Arthritis     all over  . Anemia     3 years ago  . Insomnia   . Anxiety     in past  . Depression     in past  . GERD (gastroesophageal reflux disease)   . Cancer ([place])     x2 stg II melanoma  . History of chemotherapy     topical cream for h/o skin CA  . Neuropathy, peripheral ([place])   . Complication of anesthesia     shoulder surgery- not all the way asleep  . Irritable  bowel syndrome      Past Surgical History  Procedure Laterality Date  . Shoulder surgery      right  . Knee arthroscopy    . Closed manipulation shoulder      rt  . Rotator cuff repair      left  . Tumor removal      ovary  . Total knee arthroplasty      right  . Melanoma excision with sentinel lymph node biopsy  03/18/11    Back, nodes neg  . Joint replacement      RTK  . Left knee miniscus tear repair    . Colonscopy      May 2014   . Colonoscopy with propofol N/A 11/20/2012    Procedure: COLONOSCOPY WITH PROPOFOL;  Surgeon: Garlan Fair, MD;  Location: [facility];  Service: Endoscopy;  Laterality: N/A;  . Cholecystectomy N/A 01/28/2015    Procedure: LAPAROSCOPIC CHOLECYSTECTOMY WITH INTRAOPERATIVE CHOLANGIOGRAM;  Surgeon: Autumn Messing III, MD;  Location: [facility];  Service: General;  Laterality: N/A;      Social History:  reports that she has never smoked. She has never used smokeless tobacco. She reports that she drinks about 0.6 oz of alcohol per week. She reports that she does not use illicit drugs.   Allergies  Allergen Reactions  . Epinephrine Other (See Comments)    Heart palpitations  . Penicillins Swelling    Lip swelling Has patient had a PCN reaction causing immediate rash, facial/tongue/throat swelling, SOB or lightheadedness with hypotension: Yes Has patient had a PCN reaction causing severe rash involving mucus membranes or skin necrosis: No Has patient had a PCN reaction that required hospitalization No Has patient had a PCN reaction occurring within the last 10 years: No If all of the above answers are "NO", then may proceed with Cephalosporin use.  . Valium [Diazepam] Other (See Comments)    sedation    Family History  Problem Relation Age of Onset  . Hypertension Mother   . Stroke Mother   . Heart disease Father   . Diabetes Father   . Hypertension Father   . Hyperlipidemia Father   . Diabetes Sister   . Heart disease Sister     heart murmur  . Heart disease Brother   . Pancreatic cancer Maternal Uncle   . Alzheimer's disease Paternal Aunt   . Colon cancer Maternal Grandfather   . Alzheimer's disease Paternal Aunt   . Alzheimer's disease Paternal Aunt   . Colon polyps Sister     adenomous       Prior to Admission medications   Medication Sig Start Date End Date Taking? Authorizing Provider  acetaminophen (TYLENOL) 500 MG tablet Take 500 mg by mouth every 6 (six) hours as needed  (pain/migraine).   Yes Historical Provider, MD  albuterol (PROVENTIL HFA;VENTOLIN HFA) 108 (90 BASE) MCG/ACT inhaler Inhale 1-2 puffs into the lungs every 6 (six) hours as needed for wheezing or shortness of breath.   Yes Historical Provider, MD  Ascorbic Acid (VITAMIN C) 1000 MG tablet Take 1,000 mg by mouth daily.   Yes Historical Provider, MD  aspirin EC 81 MG tablet Take 81 mg by mouth daily.   Yes Historical Provider, MD  Biotin 5000 MCG CAPS Take 5,000 mcg by mouth daily.   Yes Historical Provider, MD  CALCIUM PO Take 500 mg by mouth daily.   Yes Historical Provider, MD  Cholecalciferol (VITAMIN D3) 2000 UNITS capsule Take 2,000 Units by mouth daily.  Yes Historical Provider, MD  clindamycin (CLEOCIN) 300 MG capsule Take 600-1,200 mg by mouth See admin instructions. Take 2 -4 capsules ([id number] mg) by mouth one hour prior to dental appointment   Yes Historical Provider, MD  esomeprazole (NEXIUM) 20 MG capsule Take 20 mg by mouth daily.    Yes Historical Provider, MD  levothyroxine (SYNTHROID, LEVOTHROID) 25 MCG tablet Take 25 mcg by mouth daily before breakfast. On an empty stomach 11/26/14  Yes Historical Provider, MD  MELATONIN PO Take 1-2 tablets by mouth at bedtime as needed (sleep).   Yes Historical Provider, MD  Omega-3 Fatty Acids (FISH OIL) 1200 MG CAPS Take 1,200 mg by mouth 3 (three) times daily.   Yes Historical Provider, MD  Probiotic Product (PROBIOTIC PO) Take 1 tablet by mouth daily.   Yes Historical Provider, MD  vitamin B-12 (CYANOCOBALAMIN) 1000 MCG tablet Take 1,000 mcg by mouth daily.   Yes Historical Provider, MD  colestipol (COLESTID) 1 G tablet Take 2 tablets daily. Take separate from other medications. Take 4 hours after levothyroxine. Patient not taking: Reported on 08/08/2015 05/12/15   Manus Gunning, MD  ranitidine (ZANTAC) 150 MG tablet Take 1 tablet (150 mg total) by mouth at bedtime. Patient not taking: Reported on 08/08/2015 05/12/15   Manus Gunning, MD     Physical Exam: Filed Vitals:   08/09/15 0130 08/09/15 0200 08/09/15 0300 08/09/15 0330  BP: 157/76 159/65 143/71 135/65  Pulse: 66 69 70 72  Temp:      TempSrc:      Resp: 22 23 22 22   SpO2: 93% 95% 95% 93%     Constitutional: Vital signs reviewed. Patient is a well-developed and well-nourished in appears very easily fatigued with minimal movement Head: Normocephalic and atraumatic  Ear: TM normal bilaterally  Mouth: no erythema or exudates, MMM  Eyes: PERRL, EOMI, conjunctivae normal, No scleral icterus.  Neck: Supple, Trachea midline normal ROM, No JVD, mass, thyromegaly, or carotid bruit present.  Cardiovascular: RRR, S1 normal, S2 normal, no MRG, pulses symmetric and intact bilaterally  Pulmonary/Chest: CTAB, no wheezes, rales, or rhonchi  Abdominal: Soft. Non-tender, non-distended, bowel sounds are normal, no masses, organomegaly, or guarding present.  GU: no CVA tenderness Musculoskeletal: No joint deformities, erythema, or stiffness, ROM full and no nontender Ext: +1 pitting edema  no cyanosis, pulses palpable bilaterally (DP and PT)  Hematology: no cervical, inginal, or axillary adenopathy.  Neurological: A&O x3, Strenght is decreased symmetric bilaterally, cranial nerve II-XII are grossly intact, no focal motor deficit, sensory intact to light touch bilaterally. Question of possible left foot drift  Skin: Warm, dry and intact. No rash, cyanosis, or clubbing.  Psychiatric: Normal mood and affect. speech and behavior is normal. Judgment and thought content normal. Cognition and memory are normal.      Data Review   Micro Results No results found for this or any previous visit (from the past 240 hour(s)).  Radiology Reports Dg Chest 2 View  08/08/2015  CLINICAL DATA:  Near syncope for 1 month worse today EXAM: CHEST  2 VIEW COMPARISON:  12/21/2014 FINDINGS: The heart size and vascular pattern are normal. No consolidation or effusion. There is  evidence of postsurgical change over the left axilla which is not seen on the prior study. There is also a 3 mm and a 6 mm nodular opacity in the right apex which are not seen previously. No pleural effusions. IMPRESSION: No acute findings. However, 2 nodular opacities in the right lung apex  are noted. These are not seen on prior radiograph or CT scan performed 12/21/2014. Consider unenhanced CT thorax to further evaluate. Electronically Signed   By: Skipper Cliche M.D.   On: 08/08/2015 17:52   Mr Brain Wo Contrast  08/08/2015  CLINICAL DATA:  75 year old female with left leg weakness and dizziness. Melanoma post chemotherapy. Initial encounter. EXAM: MRI HEAD WITHOUT CONTRAST TECHNIQUE: Multiplanar, multiecho pulse sequences of the brain and surrounding structures were obtained without intravenous contrast. COMPARISON:  11/10/2014 MR. FINDINGS: No acute infarct or intracranial hemorrhage. Remote right frontal lobe infarct with encephalomalacia. Prominent small vessel disease type changes minimally changed. No intracranial mass lesion noted however, to rule out metastatic disease, contrast-enhanced imaging would be necessary. Change with sclerotic appearance of the right mandibular condyle may represent degenerative changes/ osteonecrosis with joint fluid. Metastatic disease secondary less likely consideration. 3 mm lesion of indeterminate etiology. Small metastatic focus not excluded. Cervical spondylotic changes with spinal stenosis C3-4 thru C5-6 with mild cord flattening C5-6. Major intracranial vascular structures are patent. No orbital, pituitary or pineal abnormality noted. IMPRESSION: No acute infarct or intracranial hemorrhage. Remote right frontal lobe infarct with encephalomalacia. Prominent small vessel disease type changes minimally changed. No intracranial mass lesion noted however, to rule out metastatic disease, contrast-enhanced imaging would be necessary. Change with sclerotic appearance of the  right mandibular condyle may represent degenerative changes/ osteonecrosis with joint fluid. Metastatic disease secondary less likely consideration. 3 mm lesion of indeterminate etiology. Small metastatic focus not excluded. Cervical spondylotic changes with spinal stenosis C3-4 thru C5-6 with mild cord flattening C5-6. Electronically Signed   By: Genia Del M.D.   On: 08/08/2015 19:30   Mr Jeri Cos Contrast  08/08/2015  CLINICAL DATA:  Initial evaluation for vertigo, left leg weakness. EXAM: MRI HEAD WITH CONTRAST TECHNIQUE: Multiplanar, multiecho pulse sequences of the brain and surrounding structures were obtained with intravenous contrast. COMPARISON:  Prior noncontrast brain MRI performed earlier on the same day. CONTRAST:  61mL MULTIHANCE GADOBENATE DIMEGLUMINE 529 MG/ML IV SOLN FINDINGS: Postcontrast imaging of the brain demonstrates no worrisome abnormal enhancement or mass lesion. Previously noted indeterminate 3 mm lesion, presumably osseous in nature at the dens, demonstrates homogeneous postcontrast enhancement (series 3, image 11 on sagittal sequence, series 4, image 2 on axial sequence). This is favored to be degenerative in nature given its somewhat wedge like morphology and subcortical location. Additional chronic changes as previously described on noncontrast brain MRI performed earlier the same day. IMPRESSION: 1. No intracranial mass lesion or abnormal enhancement. 2. Previously noted indeterminate 3 mm lesion at the tip of the dens demonstrates homogeneous postcontrast enhancement. Given the location and morphology of this lesion, this is favored to be degenerative in in nature. However, given the history of melanoma, a short interval follow-up study in 3 months is recommended to document stability to ensure no underlying metastatic lesion is present. Electronically Signed   By: Jeannine Boga M.D.   On: 08/08/2015 23:43     CBC  Recent Labs Lab 08/08/15 1700  WBC 6.1  HGB  13.7  HCT 41.8  PLT 237  MCV 91.5  MCH 30.0  MCHC 32.8  RDW 13.3    Chemistries   Recent Labs Lab 08/08/15 1700  NA 141  K 4.4  CL 104  CO2 25  GLUCOSE 110*  BUN 22*  CREATININE 0.94  CALCIUM 9.4   ------------------------------------------------------------------------------------------------------------------ CrCl cannot be calculated (Unknown ideal weight.). ------------------------------------------------------------------------------------------------------------------ No results for input(s): HGBA1C in the last 72 hours. ------------------------------------------------------------------------------------------------------------------  No results for input(s): CHOL, HDL, LDLCALC, TRIG, CHOLHDL, LDLDIRECT in the last 72 hours. ------------------------------------------------------------------------------------------------------------------ No results for input(s): TSH, T4TOTAL, T3FREE, THYROIDAB in the last 72 hours.  Invalid input(s): FREET3 ------------------------------------------------------------------------------------------------------------------ No results for input(s): VITAMINB12, FOLATE, FERRITIN, TIBC, IRON, RETICCTPCT in the last 72 hours.  Coagulation profile No results for input(s): INR, PROTIME in the last 168 hours.  No results for input(s): DDIMER in the last 72 hours.  Cardiac Enzymes No results for input(s): CKMB, TROPONINI, MYOGLOBIN in the last 168 hours.  Invalid input(s): CK ------------------------------------------------------------------------------------------------------------------ Invalid input(s): POCBNP   CBG: No results for input(s): GLUCAP in the last 168 hours.     EKG: Independently reviewed. Normal sinus rhythm   Assessment/Plan  Syncope: Acute. But appears symptoms have been occurring over the last month and progressively worsening. Given patient's history this could be related to her thyroid versus dehydration  versus cardiogenic - Admit to telemetry bed - Check d-dimer stat  - Lower extremity duplex Doppler ultrasound - Orthostatic vital signs  - Check in a.m. Cortisol  Urinary frequency: Will try and rule out the possibility of infection with a UA. Patient appears to have a elevated BUN to creatinine ratio 22 : 0.94. Patient denies decrease water intake Etiology is not clear, but with the history of malignancy will investigate further. - Check urinalysis, urine osmolarity, and serum osmolarity  Hypothyroidism: Chronic. Given the patient's history of taking all supplements and medications at once there could be a risk of  inadequate absorption of her levothyroxine - Check TSH and free T4 - Continue levothyroxine  History of melanoma stage II: Status post resection patient was warned about recurrent. - MRI with contrast recommended in 3 months for a 3 mm lesion that was seen - Checking a CT without contrast chest the 2 nodules seen on chest x-ray  Shortness of breath: O2 sats maintained on room air - prn albuterol - Nasal cannula oxygen if needed to keep O2 sats greater than 92%  Code Status:   full Family Communication: bedside Disposition Plan: admit   Total time spent 55 minutes.Greater than 50% of this time was spent in counseling, explanation of diagnosis, planning of further management, and coordination of care  [facility] Pager [phone]  If 7PM-7AM, please contact night-coverage [email or web address] Password [email or web address] 08/09/2015, 3:50 AM

## 2015-08-09 NOTE — Progress Notes (Signed)

## 2015-08-09 NOTE — Progress Notes (Signed)
[facility] PROGRESS NOTE  [name] [id number] DOB: February 27, 1941 DOA: 08/08/2015 PCP: Lottie Dawson, MD  Assessment/Plan: 1. Syncope -Patient is a 75 year old presenting with complaints of syncope undergoing extensive workup that included MRI of brain, kind back negative for metastatic disease. MRI did show 3 mm lesion at the dens with radiology recommending three-month follow-up study. -Troponin was negative, BNP at 20.6 -Patient have an a.m. cortisol level of 5.5, will follow up with cosyntropin stim test -Suspect syncopal event related to vasovagal  2.  Subclinical hypothyroidism. -Patient having a history of hypothyroidism with lab showing TSH of 6.66 with free T4 0.94. -She reports having generalized weakness and fatigue -Will increase her Synthroid dose from 25 g by mouth daily to 50 g by mouth daily  3.  History of melanoma. -Status post resection. -MRI showing 3 mm lesion at the dens, will need three-month follow-up study to ensure stability. -CT scan of lungs did not show nodules seen on right lung on initial chest x-ray. Probable artifact.  Code Status: Full code Family Communication:  Disposition Plan: Anticipate discharge in the next 24 hours   Consultants:    Procedures:  HPI/Subjective: [name] is a pleasant 75 year old female with a past medical history of melanoma, vertigo, admitted to the medicine service on 08/09/2015 when she presented to the emergency room after having episode of syncope. She reported having dizziness over the past month seemingly worse with lying down. The afternoon prior to admission she reported having episode of loss of consciousness. Initial workup in the emergency department included an MRI of brain which not reveal intracranial mass lesion or abnormal enhancement. Radiology reported an indeterminate 3 mm lesion at the tip of the dens and recommended a short interval follow-up study in 3 months given history  of melanoma. She also had a chest x-ray on admission that revealed possible 2 nodular opacities in the right lung, which was further worked up with CT scan of chest that do not reveal right no nodules. Other workup included venous Dopplers given elevated d-dimer 0.52 which were negative for DVT. A.m. cortisol level was slightly low at 5.5, further worked up with cosyntropin stim test.  Objective: Filed Vitals:   08/09/15 0419 08/09/15 0800  BP: 137/63 126/62  Pulse: 68 64  Temp: 97.6 F (36.4 C) 98 F (36.7 C)  Resp: 20 16    Intake/Output Summary (Last 24 hours) at 08/09/15 1712 Last data filed at 08/09/15 1400  Gross per 24 hour  Intake    462 ml  Output    501 ml  Net    -39 ml   There were no vitals filed for this visit.  Exam:   General:  Patient is in no acute distress, awake and alert  Cardiovascular: Regular rate and rhythm normal S1-S2  Respiratory: Normal respiratory effort, lungs are clear to auscultation bilaterally  Abdomen: Soft nontender nondistended  Musculoskeletal: No edema  Data Reviewed: Basic Metabolic Panel:  Recent Labs Lab 08/08/15 1700 08/09/15 0543  NA 141 142  K 4.4 3.9  CL 104 108  CO2 25 27  GLUCOSE 110* 113*  BUN 22* 17  CREATININE 0.94 0.95  CALCIUM 9.4 8.9   Liver Function Tests: No results for input(s): AST, ALT, ALKPHOS, BILITOT, PROT, ALBUMIN in the last 168 hours. No results for input(s): LIPASE, AMYLASE in the last 168 hours. No results for input(s): AMMONIA in the last 168 hours. CBC:  Recent Labs Lab 08/08/15 1700  WBC 6.1  HGB  13.7  HCT 41.8  MCV 91.5  PLT 237   Cardiac Enzymes: No results for input(s): CKTOTAL, CKMB, CKMBINDEX, TROPONINI in the last 168 hours. BNP (last 3 results)  Recent Labs  08/08/15 1732  BNP 20.6    ProBNP (last 3 results) No results for input(s): PROBNP in the last 8760 hours.  CBG: No results for input(s): GLUCAP in the last 168 hours.  No results found for this or any  previous visit (from the past 240 hour(s)).   Studies: Dg Chest 2 View  08/08/2015  CLINICAL DATA:  Near syncope for 1 month worse today EXAM: CHEST  2 VIEW COMPARISON:  12/21/2014 FINDINGS: The heart size and vascular pattern are normal. No consolidation or effusion. There is evidence of postsurgical change over the left axilla which is not seen on the prior study. There is also a 3 mm and a 6 mm nodular opacity in the right apex which are not seen previously. No pleural effusions. IMPRESSION: No acute findings. However, 2 nodular opacities in the right lung apex are noted. These are not seen on prior radiograph or CT scan performed 12/21/2014. Consider unenhanced CT thorax to further evaluate. Electronically Signed   By: Skipper Cliche M.D.   On: 08/08/2015 17:52   Ct Chest Wo Contrast  08/09/2015  CLINICAL DATA:  75 year old female with recent dizziness. Two possible right lung nodules on chest x-ray. Initial encounter. EXAM: CT CHEST WITHOUT CONTRAST TECHNIQUE: Multidetector CT imaging of the chest was performed following the standard protocol without IV contrast. COMPARISON:  Chest radiographs 08/08/2015. Chest CTA 12/21/2014, and earlier FINDINGS: The more lateral of the 2 questioned lung nodules yesterday appears to have been an EKG lead. On today CT no right lung nodule is present, and right lung parenchyma is stable since 2016. No left lung nodule, the left lung also is stable and negative. Major airways remain patent. No pleural or pericardial effusion. Chronic calcified coronary atherosclerosis. Minor calcified thoracic aortic atherosclerosis, more moderate visible abdominal aortic calcified plaque. No mediastinal or hilar lymphadenopathy. Negative non contrast thoracic inlet. No axillary lymphadenopathy. Axillary surgical clips on the left and bilateral breast implants. Stable visualized noncontrast liver, spleen, pancreas, adrenal glands, kidneys, and bowel in the upper abdomen (small gastric  hiatal hernia. Interval cholecystectomy. No acute osseous abnormality identified. IMPRESSION: 1. No right lung nodules or acute findings in the chest. One of the questioned nodules was artifact from an EKG lead. 3 2. Chronic aortic and coronary artery calcified atherosclerosis. 3. Interval cholecystectomy. Electronically Signed   By: Genevie Ann M.D.   On: 08/09/2015 10:18   Mr Brain Wo Contrast  08/08/2015  CLINICAL DATA:  75 year old female with left leg weakness and dizziness. Melanoma post chemotherapy. Initial encounter. EXAM: MRI HEAD WITHOUT CONTRAST TECHNIQUE: Multiplanar, multiecho pulse sequences of the brain and surrounding structures were obtained without intravenous contrast. COMPARISON:  11/10/2014 MR. FINDINGS: No acute infarct or intracranial hemorrhage. Remote right frontal lobe infarct with encephalomalacia. Prominent small vessel disease type changes minimally changed. No intracranial mass lesion noted however, to rule out metastatic disease, contrast-enhanced imaging would be necessary. Change with sclerotic appearance of the right mandibular condyle may represent degenerative changes/ osteonecrosis with joint fluid. Metastatic disease secondary less likely consideration. 3 mm lesion of indeterminate etiology. Small metastatic focus not excluded. Cervical spondylotic changes with spinal stenosis C3-4 thru C5-6 with mild cord flattening C5-6. Major intracranial vascular structures are patent. No orbital, pituitary or pineal abnormality noted. IMPRESSION: No acute infarct or intracranial  hemorrhage. Remote right frontal lobe infarct with encephalomalacia. Prominent small vessel disease type changes minimally changed. No intracranial mass lesion noted however, to rule out metastatic disease, contrast-enhanced imaging would be necessary. Change with sclerotic appearance of the right mandibular condyle may represent degenerative changes/ osteonecrosis with joint fluid. Metastatic disease secondary less  likely consideration. 3 mm lesion of indeterminate etiology. Small metastatic focus not excluded. Cervical spondylotic changes with spinal stenosis C3-4 thru C5-6 with mild cord flattening C5-6. Electronically Signed   By: Genia Del M.D.   On: 08/08/2015 19:30   Mr Jeri Cos Contrast  08/08/2015  CLINICAL DATA:  Initial evaluation for vertigo, left leg weakness. EXAM: MRI HEAD WITH CONTRAST TECHNIQUE: Multiplanar, multiecho pulse sequences of the brain and surrounding structures were obtained with intravenous contrast. COMPARISON:  Prior noncontrast brain MRI performed earlier on the same day. CONTRAST:  1mL MULTIHANCE GADOBENATE DIMEGLUMINE 529 MG/ML IV SOLN FINDINGS: Postcontrast imaging of the brain demonstrates no worrisome abnormal enhancement or mass lesion. Previously noted indeterminate 3 mm lesion, presumably osseous in nature at the dens, demonstrates homogeneous postcontrast enhancement (series 3, image 11 on sagittal sequence, series 4, image 2 on axial sequence). This is favored to be degenerative in nature given its somewhat wedge like morphology and subcortical location. Additional chronic changes as previously described on noncontrast brain MRI performed earlier the same day. IMPRESSION: 1. No intracranial mass lesion or abnormal enhancement. 2. Previously noted indeterminate 3 mm lesion at the tip of the dens demonstrates homogeneous postcontrast enhancement. Given the location and morphology of this lesion, this is favored to be degenerative in in nature. However, given the history of melanoma, a short interval follow-up study in 3 months is recommended to document stability to ensure no underlying metastatic lesion is present. Electronically Signed   By: Jeannine Boga M.D.   On: 08/08/2015 23:43    Scheduled Meds: . aspirin EC  81 mg Oral Daily  . cholecalciferol  2,000 Units Oral Daily  . [START ON 08/10/2015] cosyntropin  0.25 mg Intravenous Once  . enoxaparin (LOVENOX)  injection  40 mg Subcutaneous Q24H  . [START ON 08/10/2015] levothyroxine  50 mcg Oral QAC breakfast  . pantoprazole  40 mg Oral Daily  . saccharomyces boulardii  250 mg Oral Daily  . sodium chloride  1,000 mL Intravenous STAT  . vitamin B-12  1,000 mcg Oral Daily  . vitamin C  1,000 mg Oral Daily   Continuous Infusions: . sodium chloride 100 mL/hr at 08/09/15 1324    Principal Problem:   Syncope Active Problems:   Melanoma of back ([place])   Urinary frequency   Hypothyroidism    Time spent:     Kelvin Cellar  [facility] Pager [phone] 7PM-7AM, please contact night-coverage at [email or web address], password [facility] 08/09/2015, 5:12 PM

## 2015-08-09 NOTE — Progress Notes (Signed)
New Admission Note:  Arrival Method: Via stretcher Mental Orientation: Alert & oriented x4 Telemetry: box 18 Assessment: Completed Skin: see doc flowsheet IV: Left forearm Pain: denies pain Tubes: n/a Safety Measures: Safety Fall Prevention Plan was discussed Admission: initiated [place] Orientation: Patient has been orientated to the room, unit and the staff. Family: none at bedside  Orders have been reviewed and implemented. Will continue to monitor the patient. Call light has been placed within reach and bed alarm has been activated.   Leandro Reasoner BSN, RN  Phone Number: [phone] [facility]

## 2015-08-09 NOTE — Evaluation (Signed)
Physical Therapy Evaluation Patient Details Name: [name] MRN: [id number] DOB: Sep 21, 1940 Today's Date: 08/09/2015   History of Present Illness  Patient is a 75 yo female admitted 08/08/15 with syncope.  Patient reports feeling "heavy" and "lightheaded".  Eval by ENT pta - not vertigo.  Per vital signs documentation, patient does not have orthostatic hypotension.    PMH:  Melanoma, migraines, Rt TKA, Rt shoulder surgery, peripheral neuropathy, Vertigo  Clinical Impression  Patient presents with problems listed below.  Will benefit from acute PT to maximize functional independence prior to discharge.  Patient will need to function at Mod I level to return home with Home Health PT.  If unable to achieve this level, may need to consider different d/c plan.    Follow Up Recommendations Home health PT;Supervision for mobility/OOB    Equipment Recommendations  Rolling walker with 5" wheels    Recommendations for Other Services       Precautions / Restrictions Precautions Precautions: Fall Precaution Comments: Dizziness, lightheadedness Restrictions Weight Bearing Restrictions: No      Mobility  Bed Mobility Overal bed mobility: Needs Assistance Bed Mobility: Supine to Sit;Sit to Supine     Supine to sit: Min guard Sit to supine: Min guard   General bed mobility comments: Patient using bedrail to move to/from sitting.  Assist for safety.  Transfers Overall transfer level: Needs assistance Equipment used: Rolling walker (2 wheeled) Transfers: Sit to/from [facility] Sit to Stand: Min assist Stand pivot transfers: Min assist       General transfer comment: Verbal cues for hand placement and technique.  Assist to rise to standing and to steady.  Patient unsteady in stance, improved with UE support on RW.  Patient performed stand-pivot transfer to [facility] with min assist to steady.  Ambulation/Gait Ambulation/Gait assistance: Min assist Ambulation Distance  (Feet): 30 Feet Assistive device: Rolling walker (2 wheeled) Gait Pattern/deviations: Step-through pattern;Decreased step length - right;Decreased step length - left;Shuffle Gait velocity: decreased Gait velocity interpretation: Below normal speed for age/gender General Gait Details: Verbal cues for safe use of RW.  Patient with unsteady gait, requiring min assist for safety/balance.  Fatigues quickly.  Stairs            Wheelchair Mobility    Modified Rankin (Stroke Patients Only)       Balance Overall balance assessment: Needs assistance         Standing balance support: No upper extremity supported Standing balance-[name]: Poor                               Pertinent Vitals/Pain Pain Assessment: No/denies pain    Home Living Family/patient expects to be discharged to:: Private residence Living Arrangements: Alone Available Help at Discharge: Family;Available PRN/intermittently Type of Home: House ([place]) Home Access: Level entry     Home Layout: One level Home Equipment: Grab bars - tub/shower      Prior Function Level of Independence: Independent         Comments: Drives     Hand Dominance        Extremity/Trunk Assessment   Upper Extremity Assessment: Overall WFL for tasks assessed           Lower Extremity Assessment: RLE deficits/detail;LLE deficits/detail RLE Deficits / Details: Strength grossly 4+/5; decreased sensation LLE Deficits / Details: Strength grossly 4/5; decreased sensation; slight edema noted     Communication   Communication: No difficulties  Cognition Arousal/Alertness: Awake/alert Behavior During Therapy: WFL for tasks assessed/performed Overall Cognitive Status: Within Functional Limits for tasks assessed                      General Comments      Exercises        Assessment/Plan    PT Assessment Patient needs continued PT services  PT Diagnosis Difficulty  walking;Abnormality of gait;Generalized weakness   PT Problem List Decreased strength;Decreased activity tolerance;Decreased balance;Decreased mobility;Decreased knowledge of use of DME;Impaired sensation  PT Treatment Interventions DME instruction;Gait training;Functional mobility training;Therapeutic activities;Therapeutic exercise;Balance training;Patient/family education   PT Goals (Current goals can be found in the Care Plan section) Acute Rehab PT Goals Patient Stated Goal: To find out what is wrong. PT Goal Formulation: With patient Time For Goal Achievement: 08/16/15 Potential to Achieve Goals: Good    Frequency Min 3X/week   Barriers to discharge Decreased caregiver support Patient lives alone    Co-evaluation               End of Session Equipment Utilized During Treatment: Gait belt Activity Tolerance: Patient limited by fatigue Patient left: in bed;with call bell/phone within reach      Functional Assessment Tool Used: Clinical judgement Functional Limitation: Mobility: Walking and moving around Mobility: Walking and Moving Around Current Status [id number]): At least 1 percent but less than 20 percent impaired, limited or restricted Mobility: Walking and Moving Around Goal Status [phone]): 0 percent impaired, limited or restricted    Time: [id number] PT Time Calculation (min) (ACUTE ONLY): 32 min   Charges:   PT Evaluation $PT Eval Moderate Complexity: 1 Procedure PT Treatments $Gait Training: 8-22 mins   PT G Codes:   PT G-Codes **NOT FOR INPATIENT CLASS** Functional Assessment Tool Used: Clinical judgement Functional Limitation: Mobility: Walking and moving around Mobility: Walking and Moving Around Current Status [id number]): At least 1 percent but less than 20 percent impaired, limited or restricted Mobility: Walking and Moving Around Goal Status [phone]): 0 percent impaired, limited or restricted    Despina Pole 08/09/2015, 4:15 PM Carita Pian. Sanjuana Kava,  [place] Pager [phone]

## 2015-08-09 NOTE — Progress Notes (Signed)
VASCULAR LAB PRELIMINARY  PRELIMINARY  PRELIMINARY  PRELIMINARY  Bilateral lower extremity venous duplex completed.    Preliminary report:  Bilateral:  No evidence of DVT, superficial thrombosis, or Baker's Cyst.   Clarance Bollard, RVS 08/09/2015, 9:20 AM

## 2015-08-10 DIAGNOSIS — E038 Other specified hypothyroidism: Secondary | ICD-10-CM | POA: Diagnosis not present

## 2015-08-10 DIAGNOSIS — R55 Syncope and collapse: Secondary | ICD-10-CM | POA: Diagnosis not present

## 2015-08-10 LAB — ACTH STIMULATION, 3 TIME POINTS
Cortisol, 30 Min: 21.4 ug/dL
Cortisol, 60 Min: 25.5 ug/dL
Cortisol, Base: 8 ug/dL

## 2015-08-10 LAB — BASIC METABOLIC PANEL
Anion gap: 10 (ref 5–15)
BUN: 14 mg/dL (ref 6–20)
CO2: 24 mmol/L (ref 22–32)
Calcium: 8.8 mg/dL — ABNORMAL LOW (ref 8.9–10.3)
Chloride: 108 mmol/L (ref 101–111)
Creatinine, Ser: 0.87 mg/dL (ref 0.44–1.00)
GFR calc Af Amer: >60 mL/min (ref >60)
GFR calc non Af Amer: >60 mL/min (ref >60)
Glucose, Bld: 102 mg/dL — ABNORMAL HIGH (ref 65–99)
Potassium: 3.7 mmol/L (ref 3.5–5.1)
Sodium: 142 mmol/L (ref 135–145)

## 2015-08-10 LAB — CBC
HCT: 39.4 % (ref 36.0–46.0)
Hemoglobin: 13.1 g/dL (ref 12.0–15.0)
MCH: 30.4 pg (ref 26.0–34.0)
MCHC: 33.2 g/dL (ref 30.0–36.0)
MCV: 91.4 fL (ref 78.0–100.0)
Platelets: 198 10*3/uL (ref 150–400)
RBC: 4.31 MIL/uL (ref 3.87–5.11)
RDW: 13.5 % (ref 11.5–15.5)
WBC: 4.7 10*3/uL (ref 4.0–10.5)

## 2015-08-10 MED ORDER — LEVOTHYROXINE SODIUM 50 MCG PO TABS: 50.0000 ug | ORAL_TABLET | Freq: Every day | ORAL | Status: DC

## 2015-08-10 MED ORDER — MECLIZINE HCL 32 MG PO TABS: 32.0000 mg | ORAL_TABLET | Freq: Three times a day (TID) | ORAL | Status: DC | PRN

## 2015-08-10 NOTE — Care Management Note (Signed)
Case Management Note  Patient Details  Name: [name] MRN: [id number] Date of Birth: 1940-09-06  Subjective/Objective:         CM following for progression and d/c planning.           Action/Plan: 08/10/2015 Met with pt who politely declined HHPT, she is not homebound at this time.   Expected Discharge Date:       08/10/2015           Expected Discharge Plan:  Home/Self Care  In-House Referral:  NA  Discharge planning Services  CM Consult  Post Acute Care Choice:  NA Choice offered to:  NA  DME Arranged:  N/A DME Agency:  NA  HH Arranged:  NA HH Agency:  NA  Status of Service:  Completed, signed off  Medicare Important Message Given:    Date Medicare IM Given:    Medicare IM give by:    Date Additional Medicare IM Given:    Additional Medicare Important Message give by:     If discussed at [place] of Stay Meetings, dates discussed:    Additional Comments:  Adron Bene, RN 08/10/2015, 5:11 PM

## 2015-08-10 NOTE — Discharge Summary (Signed)
Physician Discharge Summary  [name] [id number] DOB: 1940-08-15 DOA: 08/08/2015  PCP: Leah Dawson, MD  Admit date: 08/08/2015 Discharge date: 08/10/2015  Time spent: 35 minutes  Recommendations for Outpatient Follow-up:  1. Please follow up on patient's dizziness, I suspect its due to vertigo. She was given a script for Meclizine.  2. Repeat at [facility] in 4-6 weeks, found to have subclinical hypothyroidism during this hospitalization. TSH 6.66. Her Synthroid was increased to 50 mcg PO q daily.   3. Please follow up on repeat MRI of brain in 3 months. MRI obtained during this hospitalization showed 3 mm lesion at the dens with radiology recommending three-month follow-up study   Discharge Diagnoses:  Principal Problem:   Syncope Active Problems:   Melanoma of back ([place])   Urinary frequency   Hypothyroidism   Discharge Condition: Stable  Diet recommendation: Regular Diet  There were no vitals filed for this visit.  History of present illness:  [name] is a 75 year old female with a past medical history significant for melanoma stage 2 s/p resection, migraines, hypothyroidism, gerd; who presents with complaints of syncope. Patient noticed that symptoms have been going on for the last month where she reports these "DIZZY spells". Patient states symptoms can be experienced while walking, sitting, or lying down. She reports having multiple such episodes prior to coming while laying on her couch this afternoon around 2:30 PM. She reports she may have lost consciousness for a brief moment in time. Symptoms appear to be worse with turning of her head. Associated symptoms include intermittent nausea, "heaviness" feeling, gait abnormality, intermittent palpitations, fatigue, shortness of breath with exertion, lower leg swelling. She was evaluated by her primary care provider, who sent her to ENT to be evaluated for vertigo. Patient reports being seen in told by ENT that she  did not have vertigo. Patient also reports going to her ophthalmologist for further evaluation as well but told everything was fine. Patient cannot recall when her last thyroid levels were checked. She notes that she takes for levothyroxine in the morning with all of her vitamins. Patient also describes left leg pain and new onset swelling.   Patient was evaluated with a CT scan of the head showed no acute abnormalities, then subsequent MRI with without contrast initially showed a question of a 3 mm lesion that gave concern for the possibility of metastatic melanoma for which a MRI without contrast was obtained. It is recommended to follow-up in 3 months. Chest x-ray showed 2 nodules of concern and patient notes that she was advised about recurrence of melanoma likely being in the lung.  Hospital Course:  [name] is a pleasant 75 year old female with a past medical history of melanoma, vertigo, admitted to the medicine service on 08/09/2015 when she presented to the emergency room after having episode of syncope. She reported having dizziness over the past month seemingly worse with lying down. The afternoon prior to admission she reported having episode of loss of consciousness. Initial workup in the emergency department included an MRI of brain which not reveal intracranial mass lesion or abnormal enhancement. Radiology reported an indeterminate 3 mm lesion at the tip of the dens and recommended a short interval follow-up study in 3 months given history of melanoma. She also had a chest x-ray on admission that revealed possible 2 nodular opacities in the right lung, which was further worked up with CT scan of chest that do not reveal right no nodules. Other workup included venous Dopplers  given elevated d-dimer 0.52 which were negative for DVT. A.m. cortisol level was slightly low at 5.5, further worked up with cosyntropin stim test which showed normal response. I think her dizziness is related to vertigo  for which she was given script for Meclizine.     Discharge Exam: Filed Vitals:   08/10/15 0507 08/10/15 0944  BP: 123/66 129/60  Pulse: 75 71  Temp: 98.6 F (37 C) 97.6 F (36.4 C)  Resp: 19 18     General: Patient is in no acute distress, awake and alert  Cardiovascular: Regular rate and rhythm normal S1-S2  Respiratory: Normal respiratory effort, lungs are clear to auscultation bilaterally  Abdomen: Soft nontender nondistended  Musculoskeletal: No edema  Discharge Instructions   Discharge Instructions    Call MD for:  difficulty breathing, headache or visual disturbances    Complete by:  As directed      Call MD for:  difficulty breathing, headache or visual disturbances    Complete by:  As directed      Call MD for:  extreme fatigue    Complete by:  As directed      Call MD for:  extreme fatigue    Complete by:  As directed      Call MD for:  hives    Complete by:  As directed      Call MD for:  hives    Complete by:  As directed      Call MD for:  persistant dizziness or light-headedness    Complete by:  As directed      Call MD for:  persistant dizziness or light-headedness    Complete by:  As directed      Call MD for:  persistant nausea and vomiting    Complete by:  As directed      Call MD for:  persistant nausea and vomiting    Complete by:  As directed      Call MD for:  redness, tenderness, or signs of infection (pain, swelling, redness, odor or green/yellow discharge around incision site)    Complete by:  As directed      Call MD for:  redness, tenderness, or signs of infection (pain, swelling, redness, odor or green/yellow discharge around incision site)    Complete by:  As directed      Call MD for:  severe uncontrolled pain    Complete by:  As directed      Call MD for:  severe uncontrolled pain    Complete by:  As directed      Call MD for:  temperature >100.4    Complete by:  As directed      Call MD for:  temperature >100.4    Complete  by:  As directed      Call MD for:    Complete by:  As directed      Call MD for:    Complete by:  As directed      Diet - low sodium heart healthy    Complete by:  As directed      Diet - low sodium heart healthy    Complete by:  As directed      Increase activity slowly    Complete by:  As directed      Increase activity slowly    Complete by:  As directed           Current Discharge Medication List    START taking these medications  Details  meclizine (ANTIVERT) 32 MG tablet Take 1 tablet (32 mg total) by mouth 3 (three) times daily as needed. Qty: 20 tablet, Refills: 0      CONTINUE these medications which have CHANGED   Details  levothyroxine (SYNTHROID) 50 MCG tablet Take 1 tablet (50 mcg total) by mouth daily before breakfast. Qty: 30 tablet, Refills: 1      CONTINUE these medications which have NOT CHANGED   Details  acetaminophen (TYLENOL) 500 MG tablet Take 500 mg by mouth every 6 (six) hours as needed (pain/migraine).    albuterol (PROVENTIL HFA;VENTOLIN HFA) 108 (90 BASE) MCG/ACT inhaler Inhale 1-2 puffs into the lungs every 6 (six) hours as needed for wheezing or shortness of breath.    Ascorbic Acid (VITAMIN C) 1000 MG tablet Take 1,000 mg by mouth daily.    aspirin EC 81 MG tablet Take 81 mg by mouth daily.    Biotin 5000 MCG CAPS Take 5,000 mcg by mouth daily.    CALCIUM PO Take 500 mg by mouth daily.    Cholecalciferol (VITAMIN D3) 2000 UNITS capsule Take 2,000 Units by mouth daily.    esomeprazole (NEXIUM) 20 MG capsule Take 20 mg by mouth daily.     Omega-3 Fatty Acids (FISH OIL) 1200 MG CAPS Take 1,200 mg by mouth 3 (three) times daily.    Probiotic Product (PROBIOTIC PO) Take 1 tablet by mouth daily.    vitamin B-12 (CYANOCOBALAMIN) 1000 MCG tablet Take 1,000 mcg by mouth daily.    colestipol (COLESTID) 1 G tablet Take 2 tablets daily. Take separate from other medications. Take 4 hours after levothyroxine. Qty: 180 tablet, Refills: 0     ranitidine (ZANTAC) 150 MG tablet Take 1 tablet (150 mg total) by mouth at bedtime. Qty: 90 tablet, Refills: 3      STOP taking these medications     clindamycin (CLEOCIN) 300 MG capsule      MELATONIN PO        Allergies  Allergen Reactions  . Epinephrine Other (See Comments)    Heart palpitations  . Penicillins Swelling    Lip swelling Has patient had a PCN reaction causing immediate rash, facial/tongue/throat swelling, SOB or lightheadedness with hypotension: Yes Has patient had a PCN reaction causing severe rash involving mucus membranes or skin necrosis: No Has patient had a PCN reaction that required hospitalization No Has patient had a PCN reaction occurring within the last 10 years: No If all of the above answers are "NO", then may proceed with Cephalosporin use.  . Valium [Diazepam] Other (See Comments)    sedation   Follow-up Information    Follow up with Shamleffer, Herschell Dimes, MD In 2 weeks.   Specialty:  Internal Medicine   Why:  APPOINTMENT: Message Left with Tammy @ office for the need of this appointment   Contact information:   [address]  [address] [place] [address] [phone]       Follow up with PATEL, DONIKA, DO In 4 weeks.   Specialty:  Neurology   Why:  APPOINTMENT:  Thursday, 10-08-15 at 7:45 am   Contact information:   [address] [place] [id number] [phone]        The results of significant diagnostics from this hospitalization (including imaging, microbiology, ancillary and laboratory) are listed below for reference.    Significant Diagnostic Studies: Dg Chest 2 View  08/08/2015  CLINICAL DATA:  Near syncope for 1 month worse today EXAM: CHEST  2 VIEW COMPARISON:  12/21/2014 FINDINGS: The heart size and vascular pattern are normal. No consolidation or effusion. There is evidence of postsurgical change over the left axilla which is not seen on the prior study. There is also a 3 mm and a 6 mm  nodular opacity in the right apex which are not seen previously. No pleural effusions. IMPRESSION: No acute findings. However, 2 nodular opacities in the right lung apex are noted. These are not seen on prior radiograph or CT scan performed 12/21/2014. Consider unenhanced CT thorax to further evaluate. Electronically Signed   By: Skipper Cliche M.D.   On: 08/08/2015 17:52   Ct Chest Wo Contrast  08/09/2015  CLINICAL DATA:  75 year old female with recent dizziness. Two possible right lung nodules on chest x-ray. Initial encounter. EXAM: CT CHEST WITHOUT CONTRAST TECHNIQUE: Multidetector CT imaging of the chest was performed following the standard protocol without IV contrast. COMPARISON:  Chest radiographs 08/08/2015. Chest CTA 12/21/2014, and earlier FINDINGS: The more lateral of the 2 questioned lung nodules yesterday appears to have been an EKG lead. On today CT no right lung nodule is present, and right lung parenchyma is stable since 2016. No left lung nodule, the left lung also is stable and negative. Major airways remain patent. No pleural or pericardial effusion. Chronic calcified coronary atherosclerosis. Minor calcified thoracic aortic atherosclerosis, more moderate visible abdominal aortic calcified plaque. No mediastinal or hilar lymphadenopathy. Negative non contrast thoracic inlet. No axillary lymphadenopathy. Axillary surgical clips on the left and bilateral breast implants. Stable visualized noncontrast liver, spleen, pancreas, adrenal glands, kidneys, and bowel in the upper abdomen (small gastric hiatal hernia. Interval cholecystectomy. No acute osseous abnormality identified. IMPRESSION: 1. No right lung nodules or acute findings in the chest. One of the questioned nodules was artifact from an EKG lead. 3 2. Chronic aortic and coronary artery calcified atherosclerosis. 3. Interval cholecystectomy. Electronically Signed   By: Genevie Ann M.D.   On: 08/09/2015 10:18   Mr Brain Wo  Contrast  08/08/2015  CLINICAL DATA:  75 year old female with left leg weakness and dizziness. Melanoma post chemotherapy. Initial encounter. EXAM: MRI HEAD WITHOUT CONTRAST TECHNIQUE: Multiplanar, multiecho pulse sequences of the brain and surrounding structures were obtained without intravenous contrast. COMPARISON:  11/10/2014 MR. FINDINGS: No acute infarct or intracranial hemorrhage. Remote right frontal lobe infarct with encephalomalacia. Prominent small vessel disease type changes minimally changed. No intracranial mass lesion noted however, to rule out metastatic disease, contrast-enhanced imaging would be necessary. Change with sclerotic appearance of the right mandibular condyle may represent degenerative changes/ osteonecrosis with joint fluid. Metastatic disease secondary less likely consideration. 3 mm lesion of indeterminate etiology. Small metastatic focus not excluded. Cervical spondylotic changes with spinal stenosis C3-4 thru C5-6 with mild cord flattening C5-6. Major intracranial vascular structures are patent. No orbital, pituitary or pineal abnormality noted. IMPRESSION: No acute infarct or intracranial hemorrhage. Remote right frontal lobe infarct with encephalomalacia. Prominent small vessel disease type changes minimally changed. No intracranial mass lesion noted however, to rule out metastatic disease, contrast-enhanced imaging would be necessary. Change with sclerotic appearance of the right mandibular condyle may represent degenerative changes/ osteonecrosis with joint fluid. Metastatic disease secondary less likely consideration. 3 mm lesion of indeterminate etiology. Small metastatic focus not excluded. Cervical spondylotic changes with spinal stenosis C3-4 thru C5-6 with mild cord flattening C5-6. Electronically Signed   By: Genia Del M.D.   On: 08/08/2015 19:30   Mr Jeri Cos Contrast  08/08/2015  CLINICAL DATA:  Initial  evaluation for vertigo, left leg weakness. EXAM: MRI HEAD  WITH CONTRAST TECHNIQUE: Multiplanar, multiecho pulse sequences of the brain and surrounding structures were obtained with intravenous contrast. COMPARISON:  Prior noncontrast brain MRI performed earlier on the same day. CONTRAST:  51mL MULTIHANCE GADOBENATE DIMEGLUMINE 529 MG/ML IV SOLN FINDINGS: Postcontrast imaging of the brain demonstrates no worrisome abnormal enhancement or mass lesion. Previously noted indeterminate 3 mm lesion, presumably osseous in nature at the dens, demonstrates homogeneous postcontrast enhancement (series 3, image 11 on sagittal sequence, series 4, image 2 on axial sequence). This is favored to be degenerative in nature given its somewhat wedge like morphology and subcortical location. Additional chronic changes as previously described on noncontrast brain MRI performed earlier the same day. IMPRESSION: 1. No intracranial mass lesion or abnormal enhancement. 2. Previously noted indeterminate 3 mm lesion at the tip of the dens demonstrates homogeneous postcontrast enhancement. Given the location and morphology of this lesion, this is favored to be degenerative in in nature. However, given the history of melanoma, a short interval follow-up study in 3 months is recommended to document stability to ensure no underlying metastatic lesion is present. Electronically Signed   By: Jeannine Boga M.D.   On: 08/08/2015 23:43    Microbiology: No results found for this or any previous visit (from the past 240 hour(s)).   Labs: Basic Metabolic Panel:  Recent Labs Lab 08/08/15 1700 08/09/15 0543 08/10/15 0614  NA 141 142 142  K 4.4 3.9 3.7  CL 104 108 108  CO2 25 27 24   GLUCOSE 110* 113* 102*  BUN 22* 17 14  CREATININE 0.94 0.95 0.87  CALCIUM 9.4 8.9 8.8*   Liver Function Tests: No results for input(s): AST, ALT, ALKPHOS, BILITOT, PROT, ALBUMIN in the last 168 hours. No results for input(s): LIPASE, AMYLASE in the last 168 hours. No results for input(s): AMMONIA in the  last 168 hours. CBC:  Recent Labs Lab 08/08/15 1700 08/10/15 0614  WBC 6.1 4.7  HGB 13.7 13.1  HCT 41.8 39.4  MCV 91.5 91.4  PLT 237 198   Cardiac Enzymes: No results for input(s): CKTOTAL, CKMB, CKMBINDEX, TROPONINI in the last 168 hours. BNP: BNP (last 3 results)  Recent Labs  08/08/15 1732  BNP 20.6    ProBNP (last 3 results) No results for input(s): PROBNP in the last 8760 hours.  CBG: No results for input(s): GLUCAP in the last 168 hours.     Signed:  Kelvin Cellar MD.  [facility] 08/10/2015, 1:11 PM

## 2015-08-10 NOTE — Progress Notes (Signed)
[name] to be D/C'd Home per MD order.  Discussed prescriptions and follow up appointments with the patient. Prescriptions given to patient, medication list explained in detail. Pt verbalized understanding.    Medication List    STOP taking these medications        clindamycin 300 MG capsule  Commonly known as:  CLEOCIN     MELATONIN PO      TAKE these medications        acetaminophen 500 MG tablet  Commonly known as:  TYLENOL  Take 500 mg by mouth every 6 (six) hours as needed (pain/migraine).     albuterol 108 (90 Base) MCG/ACT inhaler  Commonly known as:  PROVENTIL HFA;VENTOLIN HFA  Inhale 1-2 puffs into the lungs every 6 (six) hours as needed for wheezing or shortness of breath.     aspirin EC 81 MG tablet  Take 81 mg by mouth daily.     Biotin 5000 MCG Caps  Take 5,000 mcg by mouth daily.     CALCIUM PO  Take 500 mg by mouth daily.     colestipol 1 g tablet  Commonly known as:  COLESTID  Take 2 tablets daily. Take separate from other medications. Take 4 hours after levothyroxine.     esomeprazole 20 MG capsule  Commonly known as:  NEXIUM  Take 20 mg by mouth daily.     Fish Oil 1200 MG Caps  Take 1,200 mg by mouth 3 (three) times daily.     levothyroxine 50 MCG tablet  Commonly known as:  SYNTHROID  Take 1 tablet (50 mcg total) by mouth daily before breakfast.     meclizine 32 MG tablet  Commonly known as:  ANTIVERT  Take 1 tablet (32 mg total) by mouth 3 (three) times daily as needed.     PROBIOTIC PO  Take 1 tablet by mouth daily.     ranitidine 150 MG tablet  Commonly known as:  ZANTAC  Take 1 tablet (150 mg total) by mouth at bedtime.     vitamin B-12 1000 MCG tablet  Commonly known as:  CYANOCOBALAMIN  Take 1,000 mcg by mouth daily.     vitamin C 1000 MG tablet  Take 1,000 mg by mouth daily.     Vitamin D3 2000 units capsule  Take 2,000 Units by mouth daily.        Filed Vitals:   08/10/15 0507 08/10/15 0944  BP: 123/66 129/60   Pulse: 75 71  Temp: 98.6 F (37 C) 97.6 F (36.4 C)  Resp: 19 18    Skin clean, dry and intact without evidence of skin break down, no evidence of skin tears noted. IV catheter discontinued intact. Site without signs and symptoms of complications. Dressing and pressure applied. Pt denies pain at this time. No complaints noted.  An After Visit Summary was printed and given to the patient. Patient escorted via [place], and D/C home via private auto.  Carole Civil RN [facility] Phone [phone]

## 2015-08-10 NOTE — Progress Notes (Signed)
Physical Therapy Treatment Patient Details Name: [name] MRN: [id number] DOB: 07-Mar-1941 Today's Date: 08/10/2015    History of Present Illness Patient is a 75 yo female admitted 08/08/15 with syncope.  Patient reports feeling "heavy" and "lightheaded".  Eval by ENT pta - not vertigo.  Per vital signs documentation, patient does not have orthostatic hypotension.    PMH:  Melanoma, migraines, Rt TKA, Rt shoulder surgery, peripheral neuropathy, Vertigo    PT Comments    Pt is progressing well towards all goals. Displayed Mod I in all mobility and transfers w/o c/o lightheadedness or dizziness. Ambulated 150 ft in hallway without LOB or safety concerns. Pt was safely able to ambulate throughout the room w/o AD. Pt will have support at home from friends and family once D/C'd. Recommending HHPT to maximize recovery towards [place].    Follow Up Recommendations  Home health PT;Supervision for mobility/OOB     Equipment Recommendations  None recommended by PT (pt has RW at home)    Recommendations for Other Services       Precautions / Restrictions Precautions Precautions: Fall Restrictions Weight Bearing Restrictions: No    Mobility  Bed Mobility Overal bed mobility: Modified Independent Bed Mobility: Supine to Sit     Supine to sit: Modified independent (Device/Increase time);HOB elevated     General bed mobility comments: Pt quickly moves to EOB only relying on hand rail to get over hole in bed  Transfers Overall transfer level: Modified independent Equipment used: None Transfers: Sit to/from Stand Sit to Stand: Modified independent (Device/Increase time)         General transfer comment: able to stand EOB with increased time to steady due to fuzzy feeling  Ambulation/Gait Ambulation/Gait assistance: Modified independent (Device/Increase time) Ambulation Distance (Feet): 150 Feet Assistive device: Rolling walker (2 wheeled);None Gait Pattern/deviations:  Step-through pattern;Decreased stride length;Wide base of support Gait velocity: decreased Gait velocity interpretation: Below normal speed for age/gender General Gait Details: Able to ambulate 125 ft with RW, no LOB, equal stride length, no c/o dizziness. 25 ft w/o AD no saftey concerns noted   Stairs            Wheelchair Mobility    Modified Rankin (Stroke Patients Only)       Balance Overall balance assessment: Modified Independent                                  Cognition Arousal/Alertness: Awake/alert Behavior During Therapy: WFL for tasks assessed/performed Overall Cognitive Status: Within Functional Limits for tasks assessed                      Exercises      General Comments        Pertinent Vitals/Pain Pain Assessment: No/denies pain    Home Living                      Prior Function            PT Goals (current goals can now be found in the care plan section) Acute Rehab PT Goals Patient Stated Goal: go home this afternoon Progress towards PT goals: Progressing toward goals    Frequency       PT Plan Current plan remains appropriate;Equipment recommendations need to be updated    Co-evaluation             End of Session Equipment Utilized  During Treatment: Gait belt Activity Tolerance: Patient tolerated treatment well Patient left: in bed     Time: 1331-1340 PT Time Calculation (min) (ACUTE ONLY): 9 min  Charges:  $Gait Training: 8-22 mins                    G Codes:      Ara Kussmaul Aug 15, 2015, 2:37 PM Ara Kussmaul, Student Physical Therapist Acute Rehab [phone]

## 2015-08-14 ENCOUNTER — Ambulatory Visit: Payer: Self-pay | Admitting: Cardiovascular Disease

## 2015-08-14 DIAGNOSIS — R42 Dizziness and giddiness: Secondary | ICD-10-CM | POA: Diagnosis not present

## 2015-08-24 DIAGNOSIS — E039 Hypothyroidism, unspecified: Secondary | ICD-10-CM | POA: Diagnosis not present

## 2015-08-24 DIAGNOSIS — R42 Dizziness and giddiness: Secondary | ICD-10-CM | POA: Diagnosis not present

## 2015-08-24 DIAGNOSIS — R109 Unspecified abdominal pain: Secondary | ICD-10-CM | POA: Diagnosis not present

## 2015-08-25 DIAGNOSIS — R42 Dizziness and giddiness: Secondary | ICD-10-CM | POA: Diagnosis not present

## 2015-09-04 ENCOUNTER — Ambulatory Visit: Payer: PPO | Attending: Unknown Physician Specialty

## 2015-09-04 DIAGNOSIS — R262 Difficulty in walking, not elsewhere classified: Secondary | ICD-10-CM

## 2015-09-04 DIAGNOSIS — R42 Dizziness and giddiness: Secondary | ICD-10-CM

## 2015-09-04 NOTE — Therapy (Signed)
[name] [facility] SERVICES [address] [place], [place], [address] Phone: [phone]   Fax:  [phone]  Physical Therapy Evaluation  Patient Details  Name: [name] MRN: [id number] Date of Birth: 09/27/1940 Referring Provider: Beverly [name]  Encounter Date: 09/04/2015      PT End of Session - 09/04/15 1138    Visit Number 1   Number of Visits 7   Date for PT Re-Evaluation Oct 25, 2015   Authorization Type g codes every 10th visit/30 days   PT Start Time 1005   PT Stop Time 1112   PT Time Calculation (min) 67 min   Equipment Utilized During Treatment Gait belt   Activity Tolerance Patient tolerated treatment well   Behavior During Therapy WFL for tasks assessed/performed      Past Medical History  Diagnosis Date  . Acid reflux   . Thyroid disease   . Melanoma ([place]) 2013  . Weight gain   . Fatigue   . Atypical moles   . Bronchitis   . Dizziness   . Retina disorder     left  . LLQ abdominal pain   . Hypothyroidism   . Generalized headache     migraines  . Arthritis     all over  . Anemia     3 years ago  . Insomnia   . Anxiety     in past  . Depression     in past  . GERD (gastroesophageal reflux disease)   . Cancer ([place])     x2 stg II melanoma  . History of chemotherapy     topical cream for h/o skin CA  . Neuropathy, peripheral ([place])   . Complication of anesthesia     shoulder surgery- not all the way asleep  . Irritable bowel syndrome     Past Surgical History  Procedure Laterality Date  . Shoulder surgery      right  . Knee arthroscopy    . Closed manipulation shoulder      rt  . Rotator cuff repair      left  . Tumor removal      ovary  . Total knee arthroplasty      right  . Melanoma excision with sentinel lymph node biopsy  03/18/11    Back, nodes neg  . Joint replacement      RTK  . Left knee miniscus tear repair    . Colonscopy      May 2014  . Colonoscopy with propofol N/A  11/20/2012    Procedure: COLONOSCOPY WITH PROPOFOL;  Surgeon: Leah Fair, MD;  Location: [facility];  Service: Endoscopy;  Laterality: N/A;  . Cholecystectomy N/A 01/28/2015    Procedure: LAPAROSCOPIC CHOLECYSTECTOMY WITH INTRAOPERATIVE CHOLANGIOGRAM;  Surgeon: Leah Messing III, MD;  Location: [facility];  Service: General;  Laterality: N/A;    There were no vitals filed for this visit.  Visit Diagnosis:  Dizziness and giddiness - Plan: PT plan of care cert/re-cert  Difficulty walking - Plan: PT plan of care cert/re-cert      Subjective Assessment - 09/04/15 1140    Subjective Dizziness   Pertinent History Subjective history of current problem: Pt reports onset of dizziness and "lightheadedness" in late December 2016 which coincided with URI and severe congestion. Pt reports that the dizziness has improved since that time however pt has also been having "lightheadedness" and syncopal episodes. She reports that the episodes of syncope occur when she is sitting still,  laying down, or rolling in bed. She describes the episodes of "blacking out" for just a second and then recovering. They got so bad that she went to [facility] and was admitted on 08/09/15 (see discharge summary note below). Pt reports history of BPPV and pt states this does not feel like BPPV. She was tested with Dix-Hallpike test at [facility] and it was negative. Abnormal VNG testing at [facility] with 42% L caloric weakness. Pt has an appointment with neurologist on 09/07/15 and they are planning to repeat her MRI. Pt states she had an MRI at [facility] which showed a spot on her brain. Note in system states 23mm lesion on tip of dens.  Pt is currently undergoing root canal and has a return appointment on 10/15/14 with her dentist. She has undergone two root canals now on the same tooth with the first being unsuccessful. Pt would like to know if her abnormal VNG test is related to her episodes of syncope.   Limitations Walking    Diagnostic tests MRI ([name] 2017): 83mm lesion at tip of dens with recmmended 3 month follow-up.    Patient Stated Goals Decrease dizziness and syncopal episodes to improve function and home. Pt with increased fear of falling.    Currently in Pain? No/denies     VESTIBULAR AND BALANCE EVALUATION  Onset Date: December 2016  HISTORY:  Subjective history of current problem: Pt reports onset of dizziness and "lightheadedness" in late December 2016 which coincided with URI and severe congestion. Pt reports that the dizziness has improved since that time however pt has also been having "lightheadedness" and syncopal episodes. She reports that the episodes of syncope occur when she is sitting still, laying down, or rolling in bed. She describes the episodes of "blacking out" for just a second and then recovering. They got so bad that she went to [facility] and was admitted on 08/09/15 (see discharge summary note below). Pt reports history of BPPV and pt states this does not feel like BPPV. She was tested with Dix-Hallpike test at [facility]NT and it was negative. Abnormal VNG testing at [facility] with 42% L caloric weakness. Pt has an appointment with neurologist on 09/07/15 and they are planning to repeat her MRI. Pt states she had an MRI at [facility] which showed a spot on her brain. Note in system states 65mm lesion on tip of dens.  Pt is currently undergoing root canal and has a return appointment on 10/15/14 with her dentist. She has undergone two root canals now on the same tooth with the first being unsuccessful. Pt would like to know if her abnormal VNG test is related to her episodes of syncope.  Coped from [place] Discharge summary:  [name] is a pleasant 75 year old female with a past medical history of melanoma, vertigo, admitted to the medicine service on 08/09/2015 when she presented to the emergency room after having episode of syncope. She reported having dizziness over the past month  seemingly worse with lying down. The afternoon prior to admission she reported having episode of loss of consciousness. Initial workup in the emergency department included an MRI of brain which not reveal intracranial mass lesion or abnormal enhancement. Radiology reported an indeterminate 3 mm lesion at the tip of the dens and recommended a short interval follow-up study in 3 months given history of melanoma. She also had a chest x-ray on admission that revealed possible 2 nodular opacities in the right lung, which was  further worked up with CT scan of chest that do not reveal right lung nodules. Other workup included venous Dopplers given elevated d-dimer 0.52 which were negative for DVT. A.m. cortisol level was slightly low at 5.5, further worked up with cosyntropin stim test which showed normal response. I think her dizziness is related to vertigo for which she was given script for Meclizine.   Prior history of similar episodes: History of BPPV in the past Description of dizziness: (vertigo, unsteadiness, lightheadedness, falling, general unsteadiness, aural fullness): lightheadedness and instability, occurs more when turning head to the L. Denies vertigo.  Frequency: Multiple times throughout the day  Duration: Difficulty answering, sounds like minutes to hours Symptom nature: (motion provoked/positional/spontaneous/constant, variable, intermittent): Intermittent throughout the day, worse at night; Can occur spontaneously but mostly motion-provoked.  Provocative Factors: Head turns to the L, worse at night. Pt reports she feels lightheaded like she is going to pass out when rolling in the bed. Easing Factors: Meclizine hasn't helped. No known improving factors. Pt states that rest does not improves symptoms. "I just have to wait until they go away.   Progression of symptoms: (better, worse, no change since onset). Better, decreasing frequencing History of similar episodes: BPPV 2 times at least 3  years ago and then another bout in January 2016. Pt was treated last year at [facility] with what sounds like Epley maneuver which resolved symptoms.   Falls (yes/no): No Number of falls in past 6 months: No  Prior Functional Level: Independent for community ambulation without assistive device, drives. Has recently considered using a cane.   Auditory complaints (tinnitus, pain, drainage): None Vision (last eye exam, diplopia, recent changes): Reading glasses prn, intermittent blurred vision which is chronic but not consistent. Pt states that onset of blurred vision did not coincide with current bout of dizziness. Pt went for vision check in December 2016 after symptoms started without change in reading glass prescription. Does not wear regular corrective lenses or contacts.    Current Symptoms: Confirms: lightheadedness, occipital neuralgia, migraines (2-3x/wk), intermittent R hand numbness, unsteadiness, swaying, dizziness; Denies: vertigo, N & V, dysarthria, dysphagia, drop attacks, bowel and bladder changes, recent weight loss/gain Review of systems: Positive for skin cancer on back (melanoma), recent abnormal MRI for 19mm mass on tip of dens at [facility]. Pt reports multiple syncopal episodes.   EXAMINATION  POSTURE:   NEUROLOGICAL SCREEN: (2+ unless otherwise noted.) N=normal  Ab=abnormal  Level Dermatome R L Myotome R L Reflex R L  C3 Anterior Neck A N Sidebend C2-3 N N Jaw CN V    C4 Top of Shoulder A N Shoulder Shrug C4 N N Hoffman's UMN    C5 Lateral Upper Arm A N Shoulder ABD C4-5 N N Biceps C5-6    C6 Lateral Arm/ Thumb A N Arm Flex/ Wrist Ext C5-6 N N Brachiorad. C5-6    C7 Middle Finger A N Arm Ext//Wrist Flex C6-7 N N Triceps C7    C8 4th & 5th Finger N N Flex/ Ext Carpi Ulnaris C8 N N Patella    T1 Medial Arm A N Interossei T1 N N Gastrocnemius    L2 Medial thigh/groin A N Illiopsoas (L2-3) N N     L3 Lower thigh/med.knee N N Quadriceps (L3-4) N N Patellar (L3-4)    L4  Medial leg/lat thigh A N Tibialis Ant (L4-5) N N     L5 Lat. leg & dorsal foot N N EHL (L5) N N  S1 post/lat foot/thigh/leg A N Gastrocnemius (S1-2) N N Gastrocnemius (S1)    S2 Post./med. thigh & leg A N Hamstrings (L4-S3) N N Babinski     Reflex testing deferred at this time.   SOMATOSENSORY:         Sensation           Intact      Diminished         Absent  Light touch  See dermatome testing.    Any N & T in extremities or weakness: Pt reports history of peripheral neuropathy which started on RLE but has progressed to LLE.       COORDINATION: Finger to Nose:  Normal Pronator Drift:  Normal Heel to Shin: Normal Rapid Alternating Movements: WNL  MUSCULOSKELETAL SCREEN: Cervical Spine ROM: Age related loss of extension, lateral flexion and rotation. Painless.   VBI screen negative   ROM: Grossly WFL with age-appropriate loss  MMT: 4-/5 bilateral hip flexion but otherwise grossly at least 4 to 4+/5 throughout. No focal weakness identified  Functional Mobility:Pt appears to lack confidence with transfers and ambulation.   Gait: Scanning of visual environment with gait is: [name] to poor. Pt ambulates with en bloc pattern with limited head turns, decreased speed and minimal instability with straight plane movements. Considerable lateral deviation and stumbling with horizontal and vertical head turns (see DGI).    POSTURAL CONTROL TESTS:   Clinical Test of Sensory Interaction for Balance    (CTSIB):  CONDITION TIME STRATEGY SWAY  Eyes open, firm surface 30  1+  Eyes closed, firm surface 2.3  3+  Eyes open, foam surface 30  2+  Eyes closed, foam surface 1.1  3+    OCULOMOTOR / VESTIBULAR TESTING:  Oculomotor Exam- Room Light  Normal Abnormal Comments  Ocular Alignment N    Ocular ROM N    Spontaneous Nystagmus N    End-Gaze Nystagmus N  Age-appropriate  Smooth Pursuit N    Saccades N    VOR  A Blurring and moving of target  VOR Cancellation N    Left Head Thrust    Difficult to assess due to startle and blink  Right Head Thrust   Difficult to assess due to startle and blink  Head Shaking Nystagmus N    Static Acuity     Dynamic Acuity       Oculomotor Exam- Fixation Suppressed  Normal Abnormal Comments  Ocular Alignment N    Ocular ROM N    Spontaneous Nystagmus N    End-Gaze Nystagmus N    Left Head Thrust     Right Head Thrust     Head Shaking Nystagmus       BPPV TESTS:  Deferred due to history of syncope with rolling in bed, upcoming neurology appointment 09/07/15, and negative Dix Hallpike test at [facility]  Symptoms Duration Intensity Nystagmus  L Dix-Hallpike      R Dix-Hallpike      L Head Roll      R Head Roll      L Sidelying Test      R Sidelying Test        FUNCTIONAL OUTCOME MEASURES:  Results Comments  DHI 56/100 Impaired  ABC Scale 52.67% Below cut-off for increased fall risk  DGI 15/24 Increased fall risk   10 meter Walking Speed      TREATMENT  Neuromuscular Re-education Pt issued VOR x 1 horizontal for HEP. Pt provided handout and given tactile and verbal  cues. Performed with patient for 1 min x 3. Pt with mild "unsteadiness" after performing second bout but otherwise denies dizziness.                     [facility] PT Assessment - 09/04/15 0001    Assessment   Medical Diagnosis Dizziness   Referring Provider [name]   Onset Date/Surgical Date 07/04/15   Hand Dominance Left   Next MD Visit 09/07/15 appt with neurology, no further follow-up with ENT   Prior Therapy Yes, pt has had Epley treatments at ENT office in 2016   Precautions   Precautions Fall   Restrictions   Weight Bearing Restrictions No   Balance Screen   Has the patient fallen in the past 6 months No   Has the patient had a decrease in activity level because of a fear of falling?  Yes   Is the patient reluctant to leave their home because of a fear of falling?  Yes   [place]  residence   Living Arrangements Alone   Type of [place] Access Level entry   [place] One level   [place] bars - tub/shower   Additional Comments Walk-in shower   Prior Function   Level of [place] Retired   Charity fundraiser Status Within Functional Limits for tasks assessed   Observation/Other Assessments   Observations --   Other Surveys  Other Surveys   Activities of Balance Confidence Scale (ABC Scale)  52.76%   Dizziness Handicap Inventory (DHI)  56/100   Standardized Balance Assessment   Standardized Balance Assessment Dynamic Gait Index   Dynamic Gait Index   Level Surface Mild Impairment   Change in Gait Speed Mild Impairment   Gait with Horizontal Head Turns Moderate Impairment   Gait with Vertical Head Turns Moderate Impairment   Gait and Pivot Turn Mild Impairment   Step Over Obstacle Mild Impairment   Step Around Obstacles Normal   Steps Mild Impairment   Total Score 15                           PT Education - 09/04/15 1138    Education provided Yes   Education Details HEP, plan of care   Person(s) Educated Patient   Methods Explanation;Demonstration;Handout;Verbal cues;Tactile cues   Comprehension Verbalized understanding;Returned demonstration;Verbal cues required;Tactile cues required             PT Long Term Goals - 09/04/15 1210    PT LONG TERM GOAL #1   Title Pt will be independent for HEP to decrease symptoms of dizziness and improve function at home.    Time 6   Period Weeks   PT LONG TERM GOAL #2   Title Pt will improve ABC to greater than 67% to demonstrate decreased risk for falls   Baseline 09/04/15: 52.67%   Time 6   Period Weeks   Status New   PT LONG TERM GOAL #3   Title Pt will decrease DHI by at least 18 points in order to demonstrates clinically significant change in dizziness   Baseline 09/04/15: 56/100   Time 6   Period Weeks   Status New    PT LONG TERM GOAL #4   Title Pt will improve DGI by at least 3 points in order to demonstrate significant change in mobility  Baseline 2015-09-28: 15/24   Time 6   Period Weeks   Status New               Plan - 2015-09-28 1156    Clinical Impression Statement Pt is a pleasant 75 yo female who was referred for dizziness and abnormal VNG results at [facility]. Pt with 42% L caloric weakness on VNG. Her history is somewhat complicated as pt also reporting bouts of syncope for which she was admitted to [facility] in January 2017. While at hospital MRI revealed 15mm lesion at tip of dens. Pt has follow-up appointment scheduled with neurology for Monday 09/07/15. She reports significant RUE/RLE light touch sensory loss with testing today. Spent time educating pt that unilateral vestibular caloric weakness is not associated with syncope and pt needs to follow-up with MD regarding these symptoms. Vestibular examination positive for blurring and dizziness with VOR testing. Deferred Dix-Hallpike testing on this date due to reports of syncope and negative testing at [facility]. History does not support concerns for BPPV. Pt ambulates with en bloc gait pattern and demonstrates significant loss of balance with horizontal and vertical head turns. DGI: 15/24 placing pt at increased risk for falls. She also has poor static balance with eyes closed balance testing on both firm and compliant surfaces. Pt will benefit from skilled PT services to address deficits in vestibular function, decrease risk for falls, and improve function at home. Pt should follow-up with MD regarding syncopal episodes and current finding of decreased RUE/RLE sensation to light touch.   Pt will benefit from skilled therapeutic intervention in order to improve on the following deficits Abnormal gait;Decreased balance;Difficulty walking;Dizziness   Rehab Potential Good   Clinical Impairments Affecting Rehab Potential Positive:  motivation; Negative: syncopal episodes.    PT Frequency 1x / week   PT Duration 6 weeks   PT Treatment/Interventions Aquatic Therapy;Canalith Repostioning;DME Instruction;Gait training;Stair training;Therapeutic activities;Therapeutic exercise;Balance training;Neuromuscular re-education;Patient/family education;Manual techniques;Vestibular   PT Next Visit Plan BERG, progress VOR, initiate modified tandem progressions with head turns   PT Home Exercise Plan VOR x 1 horizontal in sitting 1 minute x 3, 4 times/day   Consulted and Agree with Plan of Care Patient          G-Codes - 09-28-2015 1213    Functional Assessment Tool Used clinical judgement, DGI, DHI, ABC, mCTSIB   Functional Limitation Mobility: Walking and moving around   Mobility: Walking and Moving Around Current Status [phone]) At least 40 percent but less than 60 percent impaired, limited or restricted   Mobility: Walking and Moving Around Goal Status [phone]) At least 1 percent but less than 20 percent impaired, limited or restricted       Problem List Patient Active Problem List   Diagnosis Date Noted  . Urinary frequency 08/09/2015  . Hypothyroidism 08/09/2015  . Syncope 08/08/2015  . Hereditary and idiopathic peripheral neuropathy 05/11/2015  . Acute pancreatitis 12/21/2014  . Gallstones 12/21/2014  . Abdominal pain 12/21/2014  . Left lower quadrant pain 08/16/2011  . Breast pain in female 07/18/2011  . Melanoma of back ([place]) 02/28/2011    Phillips Grout PT, DPT   Dorreen Valiente 28-Sep-2015, 12:16 PM  [place] [name] SERVICES [address] [place], [place], [address] Phone: [phone]   Fax:  [phone]  Name: [name] MRN: [id number] Date of Birth: 1940/08/23

## 2015-09-04 NOTE — Patient Instructions (Signed)
Gaze Stabilization: Sitting    Keeping eyes on target on wall about an arm's length away. Tilt head down 15-30 and move head side to side for _1 minute. Rest until dizziness resolves and then repeat 2 more times. Perform 4 sessions per day. Use a plain background and perform while sitting. If object becomes blurring, doubles, or goes out of focus slow down. If exercise makes you dizzy for more than 20 minutes after you stop perform slower during the next session.

## 2015-09-07 ENCOUNTER — Ambulatory Visit (INDEPENDENT_AMBULATORY_CARE_PROVIDER_SITE_OTHER): Payer: PPO | Admitting: Neurology

## 2015-09-07 ENCOUNTER — Encounter: Payer: Self-pay | Admitting: Neurology

## 2015-09-07 VITALS — BP 140/80 | HR 71 | Wt 187.5 lb

## 2015-09-07 DIAGNOSIS — G609 Hereditary and idiopathic neuropathy, unspecified: Secondary | ICD-10-CM

## 2015-09-07 DIAGNOSIS — R278 Other lack of coordination: Secondary | ICD-10-CM

## 2015-09-07 DIAGNOSIS — Q7649 Other congenital malformations of spine, not associated with scoliosis: Secondary | ICD-10-CM

## 2015-09-07 DIAGNOSIS — G119 Hereditary ataxia, unspecified: Secondary | ICD-10-CM

## 2015-09-07 NOTE — Progress Notes (Signed)
Follow-up Visit   Date: 09/07/2015    [name] MRN: [id number] DOB: 05/08/1941   Interim History: [name] is a 75 y.o. left-handed Caucasian female with GERD, hypothyroidism, stage IA malignant melanoma of the back (2012) s/p excision, idiopathic neuropathy, and GERD returning to the clinic with new complaints of vestibular ataxia and abnormal MRI brain.  The patient was accompanied to the clinic by self.  History of present illness: She reports having neuropathy since 1990s and was seeing Dr. Erling Cruz at [facility] since 2008. Symptoms started in her right foot which gradually involved left foot and over the past 2-3 years also involves her fingers bilaterally. Symptoms are described as numbness, burning, and stinging sensation. Numbness is at the level of her mid-calf now, which is constant. She was taking gabapentin 100mg  at bedtime which helped her sleep but did not provide any relief. She stopped taking this over a year ago. She was briefly taking Lyrica, but did not continue due to side effects. Previously evaluation has included normal serum protein electrophoresis, borderline high TSH, normal RA, ACE , ANA, sedimentation rate, and vitamin B12. NCS/EMG was also done, but results are unknown.   No history of diabetes, alcohol use, or chemotherapy. Her sister also has neuropathy.  She denies any problems with her memory and able to take care of own finances and driving without any safety concerns. She has a strong family history of Alzheimer's disease.  UPDATE 09/07/2015:   She reports having a congestion for three weeks in December and then developed imbalance.  She did not have vertigo.  She was seeing ENT for these symptoms who diagnosed her with vestibular neuritis. She began having spells of feeling lightheaded in January and presented to the Emergency Department after experiencing four presyncopal events.  Spells are triggered by head movement. MRI of brain not reveal  intracranial mass lesion, but there was an indeterminate 3 mm lesion at the tip of the dens and recommended a short interval follow-up study in 3 months given history of melanoma. She is here to discuss this. She has started vestibular therapy and is planning on getting a cane for support.    Medications:  Current Outpatient Prescriptions on File Prior to Visit  Medication Sig Dispense Refill  . acetaminophen (TYLENOL) 500 MG tablet Take 500 mg by mouth every 6 (six) hours as needed (pain/migraine).    Marland Kitchen albuterol (PROVENTIL HFA;VENTOLIN HFA) 108 (90 BASE) MCG/ACT inhaler Inhale 1-2 puffs into the lungs every 6 (six) hours as needed for wheezing or shortness of breath.    . Ascorbic Acid (VITAMIN C) 1000 MG tablet Take 1,000 mg by mouth daily.    Marland Kitchen aspirin EC 81 MG tablet Take 81 mg by mouth daily.    . Biotin 5000 MCG CAPS Take 5,000 mcg by mouth daily.    Marland Kitchen CALCIUM PO Take 500 mg by mouth daily.    . Cholecalciferol (VITAMIN D3) 2000 UNITS capsule Take 2,000 Units by mouth daily.    . colestipol (COLESTID) 1 G tablet Take 2 tablets daily. Take separate from other medications. Take 4 hours after levothyroxine. 180 tablet 0  . esomeprazole (NEXIUM) 20 MG capsule Take 20 mg by mouth daily.     Marland Kitchen levothyroxine (SYNTHROID) 50 MCG tablet Take 1 tablet (50 mcg total) by mouth daily before breakfast. 30 tablet 1  . meclizine (ANTIVERT) 32 MG tablet Take 1 tablet (32 mg total) by mouth 3 (three) times daily as needed. 20 tablet  0  . Omega-3 Fatty Acids (FISH OIL) 1200 MG CAPS Take 1,200 mg by mouth 3 (three) times daily.    . Probiotic Product (PROBIOTIC PO) Take 1 tablet by mouth daily.    . ranitidine (ZANTAC) 150 MG tablet Take 1 tablet (150 mg total) by mouth at bedtime. 90 tablet 3  . vitamin B-12 (CYANOCOBALAMIN) 1000 MCG tablet Take 1,000 mcg by mouth daily.     No current facility-administered medications on file prior to visit.    Allergies:  Allergies  Allergen Reactions  .  Epinephrine Other (See Comments)    Heart palpitations  . Penicillins Swelling    Lip swelling Has patient had a PCN reaction causing immediate rash, facial/tongue/throat swelling, SOB or lightheadedness with hypotension: Yes Has patient had a PCN reaction causing severe rash involving mucus membranes or skin necrosis: No Has patient had a PCN reaction that required hospitalization No Has patient had a PCN reaction occurring within the last 10 years: No If all of the above answers are "NO", then may proceed with Cephalosporin use.  . Valium [Diazepam] Other (See Comments)    sedation    Review of Systems:  CONSTITUTIONAL: No fevers, chills, night sweats, or weight loss.  EYES: No visual changes or eye pain ENT: No hearing changes.  No history of nose bleeds.   RESPIRATORY: No cough, wheezing and shortness of breath.   CARDIOVASCULAR: Negative for chest pain, and palpitations.   GI: Negative for abdominal discomfort, blood in stools or black stools.  No recent change in bowel habits.   GU:  No history of incontinence.   MUSCLOSKELETAL: No history of joint pain or swelling.  No myalgias.   SKIN: Negative for lesions, rash, and itching.   ENDOCRINE: Negative for cold or heat intolerance, polydipsia or goiter.   PSYCH:  No depression or anxiety symptoms.   NEURO: As Above.   Vital Signs:  BP 140/80 mmHg  Pulse 71  Wt 187 lb 8 oz (85.049 kg)  SpO2 95%  Neurological Exam: MENTAL STATUS including orientation to time, place, person, recent and remote memory, attention span and concentration, language, and fund of knowledge is normal.  Speech is not dysarthric.  CRANIAL NERVES: No visual field defects.  Pupils equal round and reactive to light.  Normal conjugate, extra-ocular eye movements in all directions of gaze.  No ptosis. Normal facial sensation.  Face is symmetric. Palate elevates symmetrically.  Tongue is midline.  MOTOR:  Motor strength is 5/5 in all extremities  No atrophy,  fasciculations or abnormal movements.  No pronator drift.  Tone is normal.    MSRs:  Reflexes are 2+/4 throughout, except 1+ at the Achilles bilaterally.  SENSORY:  Vibration diminished distal to knees bilaterally.  COORDINATION/GAIT:  Normal finger-to- nose-finger and heel-to-shin. Gait is unsteady, unassisted.  Data: Peripheral vascular studies 02/13/2015: Normal ABI  MRI brain and cervical spine 11/10/2014: No acute infarct. Remote right frontal lobe infarct with encephalomalacia. Prominent small vessel disease type changes representing significant progression since prior exam. No intracranial mass lesion noted on this unenhanced exam. Cervical spondylotic changes upper cervical spine most prominent C4-5 level.  MRI brain 08/07/2015: 1. No intracranial mass lesion or abnormal enhancement. 2. Previously noted indeterminate 3 mm lesion at the tip of the dens demonstrates homogeneous postcontrast enhancement. Given the location and morphology of this lesion, this is favored to be degenerative in in nature. However, given the history of melanoma, a short interval follow-up study in 3 months is recommended to  document stability to ensure no underlying metastatic lesion is present.  Lab Results  Component Value Date   [id number] 05/11/2015   Lab Results  Component Value Date   TSH 6.663* 08/09/2015   Lab Results  Component Value Date   ESRSEDRATE 7 05/11/2015    IMPRESSION/PLAN: 1.  MRI brain imaging shows small enhancement of the dens, unclear whether this represents reactive changes due to degeneration or with her history of melanoma, a new metastatic lesion.  I spend extensive time reviewing her imaging and showing that it does not involve the brain, as she was initially under the impression, but instead affects the vertebra.  Repeat MRI brain wwo contrast will be ordered in 2 months to re-evaluate changes seen in the dens  2.  Vestibular neuritis.  Patient has started  vestibular therapy.  Fall precautions discussed and cane recommended.  3.  Idiopathic peripheral neuropathy, diagnosed 1990s, manifesting predominately with paresthesias following a glove-stocking distribution. Clinically stable.   Return to clinic in 3 months  The duration of this appointment visit was 30 minutes of face-to-face time with the patient.  Greater than 50% of this time was spent in counseling, explanation of diagnosis, planning of further management, and coordination of care.   Thank you for allowing me to participate in patient's care.  If I can answer any additional questions, I would be pleased to do so.    Sincerely,    Stiven Kaspar K. Posey Pronto, DO

## 2015-09-07 NOTE — Patient Instructions (Addendum)
1.  MRI brain with and without contrast 2.  Start using a cane  Return to clinic in 4 months

## 2015-09-08 ENCOUNTER — Ambulatory Visit: Payer: PPO

## 2015-09-08 VITALS — BP 142/70 | HR 74

## 2015-09-08 DIAGNOSIS — R42 Dizziness and giddiness: Secondary | ICD-10-CM | POA: Diagnosis not present

## 2015-09-08 DIAGNOSIS — R262 Difficulty in walking, not elsewhere classified: Secondary | ICD-10-CM

## 2015-09-08 NOTE — Patient Instructions (Addendum)
1. Feet Heel-Toe "Tandem", Head Motion - Eyes Open    With eyes open, right foot directly in front of the other (stand wider to make easier), move head slowly: left and right. Perform for 30 seconds and then alternate feet and perform another 30 seconds. Alternate feet and start head turns again but this time include body (shoulders) for 30 seconds. Alternate feet and repeat. Perform 4 times/day.   2. Perform gaze stabilization exercise ("X" exercise with head turns) in standing with feet together.  REMEMBER TO PERFORM ALL THESE EXERCISES NEAR A STABLE SURFACE WHERE YOU CAN HOLD FOR SAFETY AS NEEDED IF YOU LOSE BALANCE.

## 2015-09-08 NOTE — Therapy (Addendum)
[place] [facility] SERVICES [address] [place], [place], [address] Phone: [phone]   Fax:  [phone]  Physical Therapy Treatment  Patient Details  Name: [name] MRN: [id number] Date of Birth: 1940/10/23 Referring Provider: Beverly Gust  Encounter Date: 09/08/2015      PT End of Session - 09/08/15 1455    Visit Number 2   Number of Visits 7   Date for PT Re-Evaluation 2015-11-01   Authorization Type g codes every 10th visit/30 days   PT Start Time 1355   PT Stop Time 1440   PT Time Calculation (min) 45 min   Equipment Utilized During Treatment Gait belt   Activity Tolerance Patient tolerated treatment well   Behavior During Therapy WFL for tasks assessed/performed      Past Medical History  Diagnosis Date  . Acid reflux   . Thyroid disease   . Melanoma ([place]) 2013  . Weight gain   . Fatigue   . Atypical moles   . Bronchitis   . Dizziness   . Retina disorder     left  . LLQ abdominal pain   . Hypothyroidism   . Generalized headache     migraines  . Arthritis     all over  . Anemia     3 years ago  . Insomnia   . Anxiety     in past  . Depression     in past  . GERD (gastroesophageal reflux disease)   . Cancer ([place])     x2 stg II melanoma  . History of chemotherapy     topical cream for h/o skin CA  . Neuropathy, peripheral ([place])   . Complication of anesthesia     shoulder surgery- not all the way asleep  . Irritable bowel syndrome     Past Surgical History  Procedure Laterality Date  . Shoulder surgery      right  . Knee arthroscopy    . Closed manipulation shoulder      rt  . Rotator cuff repair      left  . Tumor removal      ov[place]  . Total knee arthroplasty      right  . Melanoma excision with sentinel lymph node biopsy  03/18/11    Back, nodes neg  . Joint replacement      RTK  . Left knee miniscus tear repair    . Colonscopy      May 2014  . Colonoscopy with propofol N/A  11/20/2012    Procedure: COLONOSCOPY WITH PROPOFOL;  Surgeon: Garlan Fair, MD;  Location: [facility];  Service: Endoscopy;  Laterality: N/A;  . Cholecystectomy N/A 01/28/2015    Procedure: LAPAROSCOPIC CHOLECYSTECTOMY WITH INTRAOPERATIVE CHOLANGIOGRAM;  Surgeon: Autumn Messing III, MD;  Location: [facility];  Service: General;  Laterality: N/A;    Filed Vitals:   09/08/15 1358  BP: 142/70  Pulse: 74  SpO2: 98%    Visit Diagnosis:  Dizziness and giddiness  Difficulty walking      Subjective Assessment - 09/08/15 1358    Subjective Pt reports she is doing well. She had a follow-up appt with neurology who alleviated her anxiety regarding abnormal MRI imaging. Pt will have a repeat MRI in 2 months to assess 38mm abnormality on dens. Otherwise the neurologist was unable to provide further insight into her syncopal episodes. Pt states that her dizziness and balance are improving. She is performing HEP and has no specific  questions or concerns at this time.    Pertinent History Subjective history of current problem: Pt reports onset of dizziness and "lightheadedness" in late December 2016 which coincided with URI and severe congestion. Pt reports that the dizziness has improved since that time however pt has also been having "lightheadedness" and syncopal episodes. She reports that the episodes of syncope occur when she is sitting still, laying down, or rolling in bed. She describes the episodes of "blacking out" for just a second and then recovering. They got so bad that she went to [facility] and was admitted on 08/09/15 (see discharge summary note below). Pt reports history of BPPV and pt states this does not feel like BPPV. She was tested with Dix-Hallpike test at [facility] and it was negative. Abnormal VNG testing at [facility] with 42% L caloric weakness. Pt has an appointment with neurologist on 09/07/15 and they are planning to repeat her MRI. Pt states she had an MRI at [facility] which showed  a spot on her brain. Note in system states 67mm lesion on tip of dens.  Pt is currently undergoing root canal and has a return appointment on 10/15/14 with her dentist. She has undergone two root canals now on the same tooth with the first being unsuccessful. Pt would like to know if her abnormal VNG test is related to her episodes of syncope.   Limitations Walking   Diagnostic tests MRI ([name] 2017): 27mm lesion at tip of dens with recmmended 3 month follow-up.    Patient Stated Goals Decrease dizziness and syncopal episodes to improve function and home. Pt with increased fear of falling.    Currently in Pain? No/denies            [facility] PT Assessment - 09/08/15 1411    Standardized Balance Assessment   Standardized Balance Assessment Berg Balance Test   Berg Balance Test   Sit to Stand Able to stand without using hands and stabilize independently   Standing Unsupported Able to stand safely 2 minutes   Sitting with Back Unsupported but Feet Supported on Floor or Stool Able to sit safely and securely 2 minutes   Stand to Sit Sits safely with minimal use of hands   Transfers Able to transfer safely, minor use of hands   Standing Unsupported with Eyes Closed Able to stand 10 seconds with supervision   Standing Ubsupported with Feet Together Able to place feet together independently and stand 1 minute safely   From Standing, Reach Forward with Outstretched Arm Can reach confidently >25 cm (10")   From Standing Position, Pick up Object from Floor Able to pick up shoe safely and easily   From Standing Position, Turn to Look Behind Over each Shoulder Looks behind from both sides and weight shifts well   Turn 360 Degrees Able to turn 360 degrees safely in 4 seconds or less   Standing Unsupported, Alternately Place Feet on Step/Stool Able to stand independently and safely and complete 8 steps in 20 seconds   Standing Unsupported, One Foot in Front Able to plae foot ahead of the other independently and  hold 30 seconds   Standing on One Leg Able to lift leg independently and hold 5-10 seconds   Total Score 53      TREATMENT  Orthostatic Vitals: Supine: BP: 146/62 HR: 72; Sitting: BP: 136/66  HR: 75; Standing: BP: 132/70  HR: 82;  Physical Performance 5TSTS: 16.8 seconds; Performed BERG with patient. Pt scored: 53/56. Pt struggles  with single and tandem stance as well as eyes closed balance;  Neuromuscular Re-education  VOR Reviewed VOR x 1 horizontal in sitting with patient. Performed x 60 seconds in sitting. Pt denies dizziness. Pt requires correction not to stop at end range and cues for proper speed. Repeated VOR x 1 horizontal in narrow stance 60 seconds x 3. Pt denies dizziness during all conditions;  Semi-tandem progressions Performed semi-tandem stance balance with horizontal head turns alternating leading LE x 30 seconds each, repeated with head and body turns x 30 seconds each, denies dizziness;  Tandem gait Performed tandem gait in // bars x 4 lengths;  Airex Airex narrow stance balance with horizontal ball passes to self 60 seconds x 2, pt with multiple LOB during intervention;                      PT Education - 09/08/15 1455    Education provided Yes   Education Details HEP progression, prognosis   Person(s) Educated Patient   Methods Explanation;Demonstration;Verbal cues;Handout;Tactile cues   Comprehension Verbalized understanding;Returned demonstration;Verbal cues required             PT Long Term Goals - 09/04/15 1210    PT LONG TERM GOAL #1   Title Pt will be independent for HEP to decrease symptoms of dizziness and improve function at home.    Time 6   Period Weeks   PT LONG TERM GOAL #2   Title Pt will improve ABC to greater than 67% to demonstrate decreased risk for falls   Baseline 09/04/15: 52.67%   Time 6   Period Weeks   Status New   PT LONG TERM GOAL #3   Title Pt will decrease DHI by at least 18 points in order to  demonstrates clinically significant change in dizziness   Baseline 09/04/15: 56/100   Time 6   Period Weeks   Status New   PT LONG TERM GOAL #4   Title Pt will improve DGI by at least 3 points in order to demonstrate significant change in mobility    Baseline 09/04/15: 15/24   Time 6   Period Weeks   Status New               Plan - 09/08/15 1455    Clinical Impression Statement Pt reports improving dizziness since her initial evaluation. She requires some verbal and tactile correction for correct technique with VOR x 1 horizontal and progressed to standing today. Added semi-tandem head and head/body turns to HEP as well. Orthostatic vitals are negative for hypotension. Pt scored 53/56 on BERG with difficulty performing tandem, single leg, and eyes closed balance. Pt encouraged to continue HEP and follow-up as scheduled.    Pt will benefit from skilled therapeutic intervention in order to improve on the following deficits Abnormal gait;Decreased balance;Difficulty walking;Dizziness   Rehab Potential Good   Clinical Impairments Affecting Rehab Potential Positive: motivation; Negative: syncopal episodes.    PT Frequency 1x / week   PT Duration 6 weeks   PT Treatment/Interventions Aquatic Therapy;Canalith Repostioning;DME Instruction;Gait training;Stair training;Therapeutic activities;Therapeutic exercise;Balance training;Neuromuscular re-education;Patient/family education;Manual techniques;Vestibular   PT Next Visit Plan Progress VOR, continue ball passes on Airex and consider hallway ball toss, add single leg balance to HEP   PT Home Exercise Plan VOR x 1 horizontal in standing 1 minute x 3, 4 times/day, semitandem balance with head and head/body turns.   Consulted and Agree with Plan of Care Patient  Problem List Patient Active Problem List   Diagnosis Date Noted  . Urinary frequency 08/09/2015  . Hypothyroidism 08/09/2015  . Syncope 08/08/2015  . Hereditary and  idiopathic peripheral neuropathy 05/11/2015  . Acute pancreatitis 12/21/2014  . Gallstones 12/21/2014  . Abdominal pain 12/21/2014  . Left lower quadrant pain 08/16/2011  . Breast pain in female 07/18/2011  . Melanoma of back ([place]) 02/28/2011   Phillips Grout PT, DPT   Libi Corso 09/08/2015, 3:18 PM  [place] [facility] SERVICES [address] [place], [place], [address] Phone: [phone]   Fax:  [phone]  Name: [name] MRN: [id number] Date of Birth: 1941/02/23

## 2015-09-15 ENCOUNTER — Ambulatory Visit: Payer: PPO | Admitting: Physical Therapy

## 2015-09-16 ENCOUNTER — Encounter: Payer: Self-pay | Admitting: Physical Therapy

## 2015-09-16 ENCOUNTER — Ambulatory Visit: Payer: PPO | Attending: Unknown Physician Specialty

## 2015-09-16 VITALS — BP 159/61 | HR 70

## 2015-09-16 DIAGNOSIS — R262 Difficulty in walking, not elsewhere classified: Secondary | ICD-10-CM | POA: Diagnosis not present

## 2015-09-16 DIAGNOSIS — R42 Dizziness and giddiness: Secondary | ICD-10-CM | POA: Diagnosis not present

## 2015-09-16 NOTE — Therapy (Signed)
[place] MAIN [facility] SERVICES [address] [place], [place], [address] Phone: [phone]   Fax:  [phone]  Physical Therapy Treatment  Patient Details  Name: [name] MRN: [id number] Date of Birth: 09/07/1940 Referring Provider: Beverly Gust  Encounter Date: 09/16/2015      PT End of Session - 09/16/15 1122    Visit Number 3   Number of Visits 7   Date for PT Re-Evaluation 2015/10/23   Authorization Type g codes every 10th visit/30 days   PT Start Time 1120   PT Stop Time 1200   PT Time Calculation (min) 40 min   Equipment Utilized During Treatment Gait belt   Activity Tolerance Patient tolerated treatment well   Behavior During Therapy WFL for tasks assessed/performed      Past Medical History  Diagnosis Date  . Acid reflux   . Thyroid disease   . Melanoma ([place]) 2013  . Weight gain   . Fatigue   . Atypical moles   . Bronchitis   . Dizziness   . Retina disorder     left  . LLQ abdominal pain   . Hypothyroidism   . Generalized headache     migraines  . Arthritis     all over  . Anemia     3 years ago  . Insomnia   . Anxiety     in past  . Depression     in past  . GERD (gastroesophageal reflux disease)   . Cancer ([place])     x2 stg II melanoma  . History of chemotherapy     topical cream for h/o skin CA  . Neuropathy, peripheral ([place])   . Complication of anesthesia     shoulder surgery- not all the way asleep  . Irritable bowel syndrome     Past Surgical History  Procedure Laterality Date  . Shoulder surgery      right  . Knee arthroscopy    . Closed manipulation shoulder      rt  . Rotator cuff repair      left  . Tumor removal      ovary  . Total knee arthroplasty      right  . Melanoma excision with sentinel lymph node biopsy  03/18/11    Back, nodes neg  . Joint replacement      RTK  . Left knee miniscus tear repair    . Colonscopy      May 2014  . Colonoscopy with propofol N/A  11/20/2012    Procedure: COLONOSCOPY WITH PROPOFOL;  Surgeon: Garlan Fair, MD;  Location: [facility];  Service: Endoscopy;  Laterality: N/A;  . Cholecystectomy N/A 01/28/2015    Procedure: LAPAROSCOPIC CHOLECYSTECTOMY WITH INTRAOPERATIVE CHOLANGIOGRAM;  Surgeon: Autumn Messing III, MD;  Location: [place];  Service: General;  Laterality: N/A;    Filed Vitals:   09/16/15 1123  BP: 159/61  Pulse: 70  SpO2: 98%    Visit Diagnosis:  Dizziness and giddiness  Difficulty walking      Subjective Assessment - 09/16/15 1119    Subjective Pt states she is doing well on this date. She did not perform her HEP this weekend. Pt states that she woke up last night two separate times with her heart pounding hard but it resolved. Pt states that she found out yesterday that her aunt died and she was having some anxiety. No specific questions or concerns currently.    Pertinent History Subjective history  of current problem: Pt reports onset of dizziness and "lightheadedness" in late December 2016 which coincided with URI and severe congestion. Pt reports that the dizziness has improved since that time however pt has also been having "lightheadedness" and syncopal episodes. She reports that the episodes of syncope occur when she is sitting still, laying down, or rolling in bed. She describes the episodes of "blacking out" for just a second and then recovering. They got so bad that she went to [facility] and was admitted on 08/09/15 (see discharge summary note below). Pt reports history of BPPV and pt states this does not feel like BPPV. She was tested with Dix-Hallpike test at [facility] and it was negative. Abnormal VNG testing at [facility] with 42% L caloric weakness. Pt has an appointment with neurologist on 09/07/15 and they are planning to repeat her MRI. Pt states she had an MRI at [facility] which showed a spot on her brain. Note in system states 48mm lesion on tip of dens.  Pt is currently undergoing root  canal and has a return appointment on 10/15/14 with her dentist. She has undergone two root canals now on the same tooth with the first being unsuccessful. Pt would like to know if her abnormal VNG test is related to her episodes of syncope.   Limitations Walking   Diagnostic tests MRI ([name] 2017): 64mm lesion at tip of dens with recmmended 3 month follow-up.    Patient Stated Goals Decrease dizziness and syncopal episodes to improve function and home. Pt with increased fear of falling.    Currently in Pain? No/denies        Neuromuscular Re-education  VOR VOR x 1 horizontal in standing x 60 seconds, standing on Airex 60 seconds x 2, forward/retro ambulation x 30'; Pt denies dizziness just instability. Pt requires correction not to stop at end range and cues for proper speed as well as excursion.   Airex Airex narrow stance balance with horizontal head turns x 60 seconds, vertical head turns x 60 seconds, and horizontal head/body turns x 60 seconds; Horizontal ball passes to self 60 seconds x 2, pt with multiple LOB during intervention; Ball pass over shoulder 60 seconds varying height on other side from waist to overhead x 60 seconds; Performed semi-tandem stance balance with horizontal head turns alternating leading LE x 30 seconds each, repeated with head and body turns x 30 seconds each, denies dizziness but complains of instability;  Single Leg Balance Performed single leg balance with patient in // bars, total of 30 seconds on each second in 5-8 second bouts bilateral;  Ball Toss Ambulation in hallway with ball pass L/R to self x 75', ball toss to therapist L and R 75' each, and vertical ball toss to self x 75'. Pt requiring minA+1 correction for LOB. Intermittent staggering gait, crossing midline with stride. Denies dizziness but confirms instability;                          PT Education - 09/16/15 1120    Education provided Yes   Education Details Reinforced  HEP   Person(s) Educated Patient   Methods Explanation;Demonstration   Comprehension Verbalized understanding;Returned demonstration             PT Long Term Goals - 09/04/15 1210    PT LONG TERM GOAL #1   Title Pt will be independent for HEP to decrease symptoms of dizziness and improve function at home.  Time 6   Period Weeks   PT LONG TERM GOAL #2   Title Pt will improve ABC to greater than 67% to demonstrate decreased risk for falls   Baseline 09/04/15: 52.67%   Time 6   Period Weeks   Status New   PT LONG TERM GOAL #3   Title Pt will decrease DHI by at least 18 points in order to demonstrates clinically significant change in dizziness   Baseline 09/04/15: 56/100   Time 6   Period Weeks   Status New   PT LONG TERM GOAL #4   Title Pt will improve DGI by at least 3 points in order to demonstrate significant change in mobility    Baseline 09/04/15: 15/24   Time 6   Period Weeks   Status New               Plan - 09/16/15 1122    Clinical Impression Statement Pt reports 50-75% improvement since starting therapy. She is not consistent with HEP and discussed the importance of her home program. Pt able to progress exercises on this date. She demonstrates significant lateral gait deviation with ball toss in hallway. Progressed HEP to include SLS and also advised pt to add conflicting background to VOR. Follow-up as scheduled.    Pt will benefit from skilled therapeutic intervention in order to improve on the following deficits Abnormal gait;Decreased balance;Difficulty walking;Dizziness   Rehab Potential Good   Clinical Impairments Affecting Rehab Potential Positive: motivation; Negative: syncopal episodes.    PT Frequency 1x / week   PT Duration 6 weeks   PT Treatment/Interventions Aquatic Therapy;Canalith Repostioning;DME Instruction;Gait training;Stair training;Therapeutic activities;Therapeutic exercise;Balance training;Neuromuscular re-education;Patient/family  education;Manual techniques;Vestibular   PT Next Visit Plan Progress VOR x 1 as tolerated, progress hallway ball tosses and head turns.    PT Home Exercise Plan VOR x 1 horizontal in standing with conflicting background 1 minute x 3, 4 times/day, semitandem balance with head and head/body turns, single leg balance   Consulted and Agree with Plan of Care Patient        Problem List Patient Active Problem List   Diagnosis Date Noted  . Urinary frequency 08/09/2015  . Hypothyroidism 08/09/2015  . Syncope 08/08/2015  . Hereditary and idiopathic peripheral neuropathy 05/11/2015  . Acute pancreatitis 12/21/2014  . Gallstones 12/21/2014  . Abdominal pain 12/21/2014  . Left lower quadrant pain 08/16/2011  . Breast pain in female 07/18/2011  . Melanoma of back ([facility]) 02/28/2011   Phillips Grout PT, DPT   Stefan Karen 09/16/2015, 3:47 PM  [place] MAIN [facility] SERVICES [address] [place], [place], [address] Phone: [phone]   Fax:  [phone]  Name: MANDIE PASQUINELLI MRN: [id number] Date of Birth: 1941-06-15

## 2015-09-16 NOTE — Patient Instructions (Addendum)
   Single Leg Balance: Eyes Open    Stand on right leg with eyes open. Hold _30__ seconds total. This may require multiple bouts due to loss of balance. Repeat on the other side. Perform 3 times/side. Perform 3 times/day.

## 2015-09-18 ENCOUNTER — Ambulatory Visit: Payer: Self-pay | Admitting: Cardiovascular Disease

## 2015-09-23 ENCOUNTER — Ambulatory Visit: Payer: PPO

## 2015-09-23 VITALS — BP 148/72 | HR 67

## 2015-09-23 DIAGNOSIS — R42 Dizziness and giddiness: Secondary | ICD-10-CM

## 2015-09-23 DIAGNOSIS — R262 Difficulty in walking, not elsewhere classified: Secondary | ICD-10-CM

## 2015-09-23 NOTE — Therapy (Signed)
[place] MAIN [facility] SERVICES [address] [place], [place], [address] Phone: [phone]   Fax:  [phone]  Physical Therapy Treatment  Patient Details  Name: [name] MRN: [id number] Date of Birth: April 25, 1941 Referring Provider: Beverly Gust  Encounter Date: 09/23/2015      PT End of Session - 09/23/15 1138    Visit Number 4   Number of Visits 7   Date for PT Re-Evaluation November 11, 2015   Authorization Type g codes every 10th visit/30 days   PT Start Time 1035   PT Stop Time 1120   PT Time Calculation (min) 45 min   Equipment Utilized During Treatment Gait belt   Activity Tolerance Patient tolerated treatment well   Behavior During Therapy WFL for tasks assessed/performed      Past Medical History  Diagnosis Date  . Acid reflux   . Thyroid disease   . Melanoma ([place]) 2013  . Weight gain   . Fatigue   . Atypical moles   . Bronchitis   . Dizziness   . Retina disorder     left  . LLQ abdominal pain   . Hypothyroidism   . Generalized headache     migraines  . Arthritis     all over  . Anemia     3 years ago  . Insomnia   . Anxiety     in past  . Depression     in past  . GERD (gastroesophageal reflux disease)   . Cancer ([place])     x2 stg II melanoma  . History of chemotherapy     topical cream for h/o skin CA  . Neuropathy, peripheral ([place])   . Complication of anesthesia     shoulder surgery- not all the way asleep  . Irritable bowel syndrome     Past Surgical History  Procedure Laterality Date  . Shoulder surgery      right  . Knee arthroscopy    . Closed manipulation shoulder      rt  . Rotator cuff repair      left  . Tumor removal      ovary  . Total knee arthroplasty      right  . Melanoma excision with sentinel lymph node biopsy  03/18/11    Back, nodes neg  . Joint replacement      RTK  . Left knee miniscus tear repair    . Colonscopy      May 2014  . Colonoscopy with propofol N/A  11/20/2012    Procedure: COLONOSCOPY WITH PROPOFOL;  Surgeon: Garlan Fair, MD;  [facility];  Service: Endoscopy;  Laterality: N/A;  . Cholecystectomy N/A 01/28/2015    Procedure: LAPAROSCOPIC CHOLECYSTECTOMY WITH INTRAOPERATIVE CHOLANGIOGRAM;  Surgeon: Autumn Messing III, MD;  Location: [place];  Service: General;  Laterality: N/A;    Filed Vitals:   09/23/15 1038  BP: 148/72  Pulse: 67  SpO2: 100%    Visit Diagnosis:  Dizziness and giddiness  Difficulty walking      Subjective Assessment - 09/23/15 1037    Subjective Pt reports she feels close too 100% improved since starting therapy. She has had migraines almost daily over the last week. She is performing HEP without issue and has no specific questions or concerns at this time.    Pertinent History Subjective history of current problem: Pt reports onset of dizziness and "lightheadedness" in late December 2016 which coincided with URI and severe congestion. Pt  reports that the dizziness has improved since that time however pt has also been having "lightheadedness" and syncopal episodes. She reports that the episodes of syncope occur when she is sitting still, laying down, or rolling in bed. She describes the episodes of "blacking out" for just a second and then recovering. They got so bad that she went to [facility] and was admitted on 08/09/15 (see discharge summary note below). Pt reports history of BPPV and pt states this does not feel like BPPV. She was tested with Dix-Hallpike test at [facility] and it was negative. Abnormal VNG testing at [facility] with 42% L caloric weakness. Pt has an appointment with neurologist on 09/07/15 and they are planning to repeat her MRI. Pt states she had an MRI at [facility] which showed a spot on her brain. Note in system states 44mm lesion on tip of dens.  Pt is currently undergoing root canal and has a return appointment on 10/15/14 with her dentist. She has undergone two root canals now on the  same tooth with the first being unsuccessful. Pt would like to know if her abnormal VNG test is related to her episodes of syncope.   Limitations Walking   Diagnostic tests MRI ([name] 2017): 83mm lesion at tip of dens with recmmended 3 month follow-up.    Patient Stated Goals Decrease dizziness and syncopal episodes to improve function and home. Pt with increased fear of falling.    Currently in Pain? No/denies       Neuromuscular Re-education  VOR VOR x 1 horizontal with forward/retro ambulation 3 x 30'; Pt denies dizziness just instability. Pt demonstrates good technique, lateral veering noted;  Hallway Ambulation Forward ambulation with horizontal ball pass from hand to hand with head and eye follow 75' x 4; Forward ambulation with horizontal ball toss with therapist with head and eye follow 75' x 2; Forward ambulation with vertical ball toss to self with head and eye follow 75' x 4; Ambulation with quick turns in 8' bouts ; Forward/retro ambulation with quick turns 75' x 2;  Tandem Progressions Full tandem balance alternating forward foot with horizontal head turns x 30 seconds each; Full tandem balance alternating forward foot with vertical head turns x 30 seconds each;  Single Leg Balance Performed single leg balance with patient in // bars, total of 30 seconds on each leg; best trial: 24 seconds R, 30 seconds L; Single leg balance on Airex total of 30 seconds on each leg;                            PT Education - 09/23/15 1038    Education provided Yes   Education Details Reinforced HEP   Person(s) Educated Patient   Methods Explanation   Comprehension Verbalized understanding             PT Long Term Goals - 09/04/15 1210    PT LONG TERM GOAL #1   Title Pt will be independent for HEP to decrease symptoms of dizziness and improve function at home.    Time 6   Period Weeks   PT LONG TERM GOAL #2   Title Pt will improve ABC to greater than  67% to demonstrate decreased risk for falls   Baseline 09/04/15: 52.67%   Time 6   Period Weeks   Status New   PT LONG TERM GOAL #3   Title Pt will decrease DHI by at least 18 points  in order to demonstrates clinically significant change in dizziness   Baseline 09/04/15: 56/100   Time 6   Period Weeks   Status New   PT LONG TERM GOAL #4   Title Pt will improve DGI by at least 3 points in order to demonstrate significant change in mobility    Baseline 09/04/15: 15/24   Time 6   Period Weeks   Status New               Plan - 09/23/15 1138    Clinical Impression Statement Pt reports close to 100% improvement since starting therapy. She still demonstrates some lateral veering with head turns during ambulation but no reports of dizziness during any exercises today. Single leg balance has improved dramatically with almost 30 seconds on each leg on firm surface. HEP progressed on this date. Will repeat outcome measures at next visit and consider possible discharge.   Pt will benefit from skilled therapeutic intervention in order to improve on the following deficits Abnormal gait;Decreased balance;Difficulty walking;Dizziness   Rehab Potential Good   Clinical Impairments Affecting Rehab Potential Positive: motivation; Negative: syncopal episodes.    PT Frequency 1x / week   PT Duration 6 weeks   PT Treatment/Interventions Aquatic Therapy;Canalith Repostioning;DME Instruction;Gait training;Stair training;Therapeutic activities;Therapeutic exercise;Balance training;Neuromuscular re-education;Patient/family education;Manual techniques;Vestibular   PT Next Visit Plan repeat outcome measures and consider discharge. Attempt ambulation in medical mall with head turns.    PT Home Exercise Plan VOR x 1 horizontal with forward/retro ambulation x 3-5 times, 4 times/day, tandem balance with head and head/body turns, single leg balance on foam   Consulted and Agree with Plan of Care Patient         Problem List Patient Active Problem List   Diagnosis Date Noted  . Urinary frequency 08/09/2015  . Hypothyroidism 08/09/2015  . Syncope 08/08/2015  . Hereditary and idiopathic peripheral neuropathy 05/11/2015  . Acute pancreatitis 12/21/2014  . Gallstones 12/21/2014  . Abdominal pain 12/21/2014  . Left lower quadrant pain 08/16/2011  . Breast pain in female 07/18/2011  . Melanoma of back ([place]) 02/28/2011   Phillips Grout PT, DPT   [name] 09/23/2015, 11:43 AM  [place] MAIN [facility] SERVICES [address] [place], [place], [address] Phone: [phone]   Fax:  [phone]  Name: [name] MRN: [id number] Date of Birth: 1940-08-15

## 2015-09-23 NOTE — Patient Instructions (Addendum)
Gaze Stabilization: Walking Toward Target    Keeping eyes on target, walk toward target on wall moving head left and right. Once you reach the object repeat walking backwards. Perform 3-5 times/session. 4 sessions/day.   Single Leg (Compliant Surface) - Eyes Open    Stand on compliant surface: Lift right leg while maintaining balance over other leg. Repeat on the other side. Hold_30 ___ seconds total. Perform 3 times/leg. Do 4 sessions/day.   Feet Heel-Toe "Tandem", Head Motion - Eyes Open    With eyes open, right foot directly in front of the other, move head slowly: left and right for 30 seconds. Alternate forward foot and repeat. Perform 3 times/leg. Do 4 sessions/day.

## 2015-09-30 ENCOUNTER — Ambulatory Visit: Payer: PPO | Admitting: Physical Therapy

## 2015-10-02 ENCOUNTER — Ambulatory Visit: Payer: PPO | Admitting: Physical Therapy

## 2015-10-05 DIAGNOSIS — E039 Hypothyroidism, unspecified: Secondary | ICD-10-CM | POA: Diagnosis not present

## 2015-10-06 ENCOUNTER — Encounter: Payer: Self-pay | Admitting: Physical Therapy

## 2015-10-06 ENCOUNTER — Ambulatory Visit: Payer: PPO

## 2015-10-06 VITALS — BP 137/85 | HR 68

## 2015-10-06 DIAGNOSIS — R42 Dizziness and giddiness: Secondary | ICD-10-CM

## 2015-10-06 DIAGNOSIS — R262 Difficulty in walking, not elsewhere classified: Secondary | ICD-10-CM

## 2015-10-06 NOTE — Therapy (Signed)
[place] MAIN [facility] SERVICES [address] [place], [place], [address] Phone: [phone]   Fax:  [phone]  Physical Therapy Treatment  Patient Details  Name: [name] MRN: [id number] Date of Birth: 1941/05/16 Referring Provider: Beverly Gust  Encounter Date: 10/06/2015      PT End of Session - 10/06/15 1033    Visit Number 5   Number of Visits 7   Date for PT Re-Evaluation 12-Nov-2015   Authorization Type g codes every 10th visit/30 days   PT Start Time 1035   PT Stop Time 1120   PT Time Calculation (min) 45 min   Equipment Utilized During Treatment Gait belt   Activity Tolerance Patient tolerated treatment well   Behavior During Therapy WFL for tasks assessed/performed      Past Medical History  Diagnosis Date  . Acid reflux   . Thyroid disease   . Melanoma ([place]) 2013  . Weight gain   . Fatigue   . Atypical moles   . Bronchitis   . Dizziness   . Retina disorder     left  . LLQ abdominal pain   . Hypothyroidism   . Generalized headache     migraines  . Arthritis     all over  . Anemia     3 years ago  . Insomnia   . Anxiety     in past  . Depression     in past  . GERD (gastroesophageal reflux disease)   . Cancer ([place])     x2 stg II melanoma  . History of chemotherapy     topical cream for h/o skin CA  . Neuropathy, peripheral ([place])   . Complication of anesthesia     shoulder surgery- not all the way asleep  . Irritable bowel syndrome     Past Surgical History  Procedure Laterality Date  . Shoulder surgery      right  . Knee arthroscopy    . Closed manipulation shoulder      rt  . Rotator cuff repair      left  . Tumor removal      ovary  . Total knee arthroplasty      right  . Melanoma excision with sentinel lymph node biopsy  03/18/11    Back, nodes neg  . Joint replacement      RTK  . Left knee miniscus tear repair    . Colonscopy      May 2014  . Colonoscopy with propofol N/A  11/20/2012    Procedure: COLONOSCOPY WITH PROPOFOL;  Surgeon: Garlan Fair, MD;  Location: [facility];  Service: Endoscopy;  Laterality: N/A;  . Cholecystectomy N/A 01/28/2015    Procedure: LAPAROSCOPIC CHOLECYSTECTOMY WITH INTRAOPERATIVE CHOLANGIOGRAM;  Surgeon: Autumn Messing III, MD;  Location: [place];  Service: General;  Laterality: N/A;    Filed Vitals:   10/06/15 1033  BP: 137/85  Pulse: 68  SpO2: 98%    Visit Diagnosis:  Dizziness and giddiness  Difficulty walking      Subjective Assessment - 10/06/15 1033    Subjective Pt states that she had a GI virus last week and was unable to perform her HEP. Denies any acute onset vertigo with GI virus. She states that she has had some increased dizziness and feels like she "may have regressed." No specific questions or concerns at this time.    Pertinent History Subjective history of current problem: Pt reports onset of dizziness and "lightheadedness"  in late December 2016 which coincided with URI and severe congestion. Pt reports that the dizziness has improved since that time however pt has also been having "lightheadedness" and syncopal episodes. She reports that the episodes of syncope occur when she is sitting still, laying down, or rolling in bed. She describes the episodes of "blacking out" for just a second and then recovering. They got so bad that she went to [facility] and was admitted on 08/09/15 (see discharge summary note below). Pt reports history of BPPV and pt states this does not feel like BPPV. She was tested with Dix-Hallpike test at [facility] ENT and it was negative. Abnormal VNG testing at [facility] ENT with 42% L caloric weakness. Pt has an appointment with neurologist on 09/07/15 and they are planning to repeat her MRI. Pt states she had an MRI at [facility] which showed a spot on her brain. Note in system states 25mm lesion on tip of dens.  Pt is currently undergoing root canal and has a return appointment on 10/15/14 with her  dentist. She has undergone two root canals now on the same tooth with the first being unsuccessful. Pt would like to know if her abnormal VNG test is related to her episodes of syncope.   Limitations Walking   Diagnostic tests MRI ([name] 2017): 55mm lesion at tip of dens with recmmended 3 month follow-up.    Patient Stated Goals Decrease dizziness and syncopal episodes to improve function and home. Pt with increased fear of falling.    Currently in Pain? Yes   Pain Score --  Pt does not rate on NPRS   Pain Location Knee   Pain Orientation Left   Pain Descriptors / Indicators Aching   Pain Type Chronic pain   Pain Frequency Intermittent      Treatment  Physical Performance Explained ABC and DHI scale and allowed pt to complete (unbilled); Discussed the results of self-report surveys with patient; Updated goals with patient; BERG and DGI completed with patient; Discussed plan of care with patient;   Neuromuscular Re-education  Single Leg Balance Performed single leg balance with patient in // bars, total of 30 seconds on each leg x 2; Single leg balance on [place]x total of 30 seconds on each leg;  VOR VOR x 1 horizontal with forward/retro ambulation 2 x 30'; Pt denies dizziness just instability. Pt demonstrates good technique, mild lateral veering noted. Pt with increase in knee pain which limits activity.   Tandem Progressions Full tandem balance alternating forward foot with horizontal head turns x 30 seconds each; Full tandem balance alternating forward foot with vertical head turns x 30 seconds each; Full tandem balance alternating forward foot with horizontal head and body turns x 30 seconds each;        [facility] PT Assessment - 10/06/15 1051    Observation/Other Assessments   Other Surveys  Other Surveys   Activities of Balance Confidence Scale (ABC Scale)  65.625%   Dizziness Handicap Inventory [facility])  36/100   Standardized Balance Assessment   Standardized Balance  Assessment Berg Balance Test;Dynamic Gait Index   Berg Balance Test   Sit to Stand Able to stand without using hands and stabilize independently   Standing Unsupported Able to stand safely 2 minutes   Sitting with Back Unsupported but Feet Supported on Floor or Stool Able to sit safely and securely 2 minutes   Stand to Sit Sits safely with minimal use of hands   Transfers Able to transfer safely, minor  use of hands   Standing Unsupported with Eyes Closed Able to stand 10 seconds safely   Standing Ubsupported with Feet Together Able to place feet together independently and stand 1 minute safely   From Standing, Reach Forward with Outstretched Arm Can reach confidently >25 cm (10")   From Standing Position, Pick up Object from Floor Able to pick up shoe safely and easily   From Standing Position, Turn to Look Behind Over each Shoulder Looks behind from both sides and weight shifts well   Turn 360 Degrees Able to turn 360 degrees safely in 4 seconds or less   Standing Unsupported, Alternately Place Feet on Step/Stool Able to stand independently and safely and complete 8 steps in 20 seconds   Standing Unsupported, One Foot in Front Able to place foot tandem independently and hold 30 seconds   Standing on One Leg Able to lift leg independently and hold 5-10 seconds   Total Score 55   Dynamic Gait Index   Level Surface Normal   Change in Gait Speed Normal   Gait with Horizontal Head Turns Normal   Gait with Vertical Head Turns Normal   Gait and Pivot Turn Normal   Step Over Obstacle Mild Impairment   Step Around Obstacles Normal   Steps Mild Impairment   Total Score 22                             PT Education - 10/06/15 1033    Education provided Yes   Education Details Reinforced HEP and plan of care, discussed results of outcome measures.    Person(s) Educated Patient   Methods Explanation   Comprehension Verbalized understanding             PT Long Term  Goals - 10/06/15 1105    PT LONG TERM GOAL #1   Title Pt will be independent for HEP to decrease symptoms of dizziness and improve function at home.    Time 6   Period Weeks   Status On-going   PT LONG TERM GOAL #2   Title Pt will improve ABC to greater than 67% to demonstrate decreased risk for falls   Baseline 09/04/15: 52.67%, 10/06/15: 65.625%   Time 6   Period Weeks   Status On-going   PT LONG TERM GOAL #3   Title Pt will decrease DHI by at least 18 points in order to demonstrates clinically significant change in dizziness   Baseline 09/04/15: 56/100, 10/06/15: 36/100   Time 6   Period Weeks   Status Achieved   PT LONG TERM GOAL #4   Title Pt will improve DGI by at least 3 points in order to demonstrate significant change in mobility    Baseline 09/04/15: 15/24, 10/06/15: 22/24   Time 6   Period Weeks   Status Achieved               Plan - 10/06/15 1033    Clinical Impression Statement Pt with significant improvement in DGI on this date. Pt also improves with her BERG score as well as increase in ABC. ABC is still just slightly below cut-off. [place] dropped by 20 points from 56/100 to 36/100. Pt reports regression over last week due to viral illness and limited ability to perform HEP. Encouraged pt to restart HEP this week and will consider discharge next week pending symptoms. Activitis somewhat limited on this date due to L knee pain.  Pt will benefit from skilled therapeutic intervention in order to improve on the following deficits Abnormal gait;Decreased balance;Difficulty walking;Dizziness   Rehab Potential Good   Clinical Impairments Affecting Rehab Potential Positive: motivation; Negative: syncopal episodes.    PT Frequency 1x / week   PT Duration 6 weeks   PT Treatment/Interventions Aquatic Therapy;Canalith Repostioning;DME Instruction;Gait training;Stair training;Therapeutic activities;Therapeutic exercise;Balance training;Neuromuscular re-education;Patient/family  education;Manual techniques;Vestibular   PT Next Visit Plan Consider discharge. Attempt ambulation in medical mall with head turns as well as stairs.    PT Home Exercise Plan VOR x 1 horizontal with forward/retro ambulation x 3-5 times, 4 times/day, tandem balance with head and head/body turns, single leg balance on foam   Consulted and Agree with Plan of Care Patient          G-Codes - 2015/10/16 1042    Functional Assessment Tool Used clinical judgement, DGI, DHI, ABC, mCTSIB   Functional Limitation Mobility: Walking and moving around   Mobility: Walking and Moving Around Current Status [phone]) At least 20 percent but less than 40 percent impaired, limited or restricted   Mobility: Walking and Moving Around Goal Status [phone]) At least 1 percent but less than 20 percent impaired, limited or restricted      Problem List Patient Active Problem List   Diagnosis Date Noted  . Urinary frequency 08/09/2015  . Hypothyroidism 08/09/2015  . Syncope 08/08/2015  . Hereditary and idiopathic peripheral neuropathy 05/11/2015  . Acute pancreatitis 12/21/2014  . Gallstones 12/21/2014  . Abdominal pain 12/21/2014  . Left lower quadrant pain 08/16/2011  . Breast pain in female 07/18/2011  . Melanoma of back ([place]) 02/28/2011   Phillips Grout PT, DPT   Faiz Weber 10-16-15, 11:27 AM  [place] MAIN [facility] SERVICES [address] [place], [place], [address] Phone: [phone]   Fax:  [phone]  Name: [name] MRN: [id number] Date of Birth: 07-01-1941

## 2015-10-07 ENCOUNTER — Telehealth: Payer: Self-pay | Admitting: *Deleted

## 2015-10-07 NOTE — Telephone Encounter (Signed)
Pt asked if we trim thick toenails, she needs her toenails trimmed and filed, they are very thick.  I informed pt we do trim toenails and transferred her to the schedulers.

## 2015-10-08 ENCOUNTER — Ambulatory Visit: Payer: Self-pay | Admitting: Neurology

## 2015-10-13 ENCOUNTER — Ambulatory Visit: Payer: PPO

## 2015-10-13 VITALS — BP 155/69 | HR 79

## 2015-10-13 DIAGNOSIS — R42 Dizziness and giddiness: Secondary | ICD-10-CM

## 2015-10-13 DIAGNOSIS — R262 Difficulty in walking, not elsewhere classified: Secondary | ICD-10-CM

## 2015-10-13 NOTE — Therapy (Signed)
[place] MAIN [facility] SERVICES [address] [place], [place], [address] Phone: [phone]   Fax:  [phone]  Physical Therapy Treatment/Discharge Summary  Patient Details  Name: [name] MRN: [id number] Date of Birth: 05/01/1941 Referring Provider: Beverly Gust  Encounter Date: 10/13/2015      PT End of Session - 10/13/15 1109    Visit Number 6   Number of Visits 7   Date for PT Re-Evaluation 11-06-15   Authorization Type g codes every 10th visit/30 days   PT Start Time 1025   PT Stop Time 1105   PT Time Calculation (min) 40 min   Equipment Utilized During Treatment Gait belt   Activity Tolerance Patient tolerated treatment well   Behavior During Therapy WFL for tasks assessed/performed      Past Medical History  Diagnosis Date  . Acid reflux   . Thyroid disease   . Melanoma ([place]) 2013  . Weight gain   . Fatigue   . Atypical moles   . Bronchitis   . Dizziness   . Retina disorder     left  . LLQ abdominal pain   . Hypothyroidism   . Generalized headache     migraines  . Arthritis     all over  . Anemia     3 years ago  . Insomnia   . Anxiety     in past  . Depression     in past  . GERD (gastroesophageal reflux disease)   . Cancer ([place])     x2 stg II melanoma  . History of chemotherapy     topical cream for h/o skin CA  . Neuropathy, peripheral ([place])   . Complication of anesthesia     shoulder surgery- not all the way asleep  . Irritable bowel syndrome     Past Surgical History  Procedure Laterality Date  . Shoulder surgery      right  . Knee arthroscopy    . Closed manipulation shoulder      rt  . Rotator cuff repair      left  . Tumor removal      ovary  . Total knee arthroplasty      right  . Melanoma excision with sentinel lymph node biopsy  03/18/11    Back, nodes neg  . Joint replacement      RTK  . Left knee miniscus tear repair    . Colonscopy      May 2014  . Colonoscopy with  propofol N/A 11/20/2012    Procedure: COLONOSCOPY WITH PROPOFOL;  Surgeon: Garlan Fair, MD;  [facility]: [facility];  Service: Endoscopy;  Laterality: N/A;  . Cholecystectomy N/A 01/28/2015    Procedure: LAPAROSCOPIC CHOLECYSTECTOMY WITH INTRAOPERATIVE CHOLANGIOGRAM;  Surgeon: Autumn Messing III, MD;  Location: [place];  Service: General;  Laterality: N/A;    Filed Vitals:   10/13/15 1026  BP: 155/69  Pulse: 79  SpO2: 99%    Visit Diagnosis:  Dizziness and giddiness  Difficulty walking      Subjective Assessment - 10/13/15 1026    Subjective Pt reports she started having upset stomach yesterday and vomiting however she is feeling better today. She reports that overall her dizziness is approximately 70% improved. She has been having a lot of L knee pain and it has also started to bother her R knee. Pt states that she took 2 Aleve 1 hour ago. Pt has an MRI scheduled for 10/19/15. She is  more concerned about her knee pain currenlty than her dizziness.    Pertinent History Subjective history of current problem: Pt reports onset of dizziness and "lightheadedness" in late December 2016 which coincided with URI and severe congestion. Pt reports that the dizziness has improved since that time however pt has also been having "lightheadedness" and syncopal episodes. She reports that the episodes of syncope occur when she is sitting still, laying down, or rolling in bed. She describes the episodes of "blacking out" for just a second and then recovering. They got so bad that she went to [facility] and was admitted on 08/09/15 (see discharge summary note below). Pt reports history of BPPV and pt states this does not feel like BPPV. She was tested with Dix-Hallpike test at [facility] ENT and it was negative. Abnormal VNG testing at [facility] with 42% L caloric weakness. Pt has an appointment with neurologist on 09/07/15 and they are planning to repeat her MRI. Pt states she had an MRI at [facility] which showed a  spot on her brain. Note in system states 77mm lesion on tip of dens.  Pt is currently undergoing root canal and has a return appointment on 10/15/14 with her dentist. She has undergone two root canals now on the same tooth with the first being unsuccessful. Pt would like to know if her abnormal VNG test is related to her episodes of syncope.   Limitations Walking   Diagnostic tests MRI ([name] 2017): 80mm lesion at tip of dens with recmmended 3 month follow-up.    Patient Stated Goals Decrease dizziness and syncopal episodes to improve function and home. Pt with increased fear of falling.    Currently in Pain? Yes   Pain Score 5   0/10 R knee currently   Pain Location Knee   Pain Orientation Left        Treatment  Neuromuscular Re-education  Single Leg Balance Performed single leg balance with patient in // bars, total of 30 seconds on each leg, pt able to stand for a full 30 seconds on each side without LOB  Tandem Progressions Full tandem balance alternating forward foot x 30 seconds each; Full tandem balance alternating forward foot with horizontal head turns x 30 seconds each;  VOR VOR x 1 horizontal with forward/retro ambulation 2 x 30'; Pt denies dizziness just instability. Pt demonstrates good technique, mild lateral veering noted. Pt with increase in knee pain which limits activity.   Hallway Ambulation Forward ambulation with horizontal ball tosses with therapist 74' x 2; Retro ambulation with ball pass over shoulders x 75';  Medical Mall Ambulation Ambulated with patient through lower level halls and medical mall with horizontal and vertical head turns, gait speed changes, and quick turns. Pt denies dizziness. L knee pain gradually worsens and slows gait. Following ambulation with patient reviewed importance of continued HEP and discussed plan of care.                              PT Education - 10/13/15 1108    Education provided Yes   Education  Details Reinforced HEP and plan of care   Person(s) Educated Patient   Methods Explanation   Comprehension Verbalized understanding             PT Long Term Goals - 10/13/15 1112    PT LONG TERM GOAL #1   Title Pt will be independent for HEP to decrease symptoms of  dizziness and improve function at home.    Time 6   Period Weeks   Status Achieved   PT LONG TERM GOAL #2   Title Pt will improve ABC to greater than 67% to demonstrate decreased risk for falls   Baseline 09/04/15: 52.67%, 10/06/15: 65.625%   Time 6   Period Weeks   Status On-going   PT LONG TERM GOAL #3   Title Pt will decrease DHI by at least 18 points in order to demonstrates clinically significant change in dizziness   Baseline 09/04/15: 56/100, 10/06/15: 36/100   Time 6   Period Weeks   Status Achieved   PT LONG TERM GOAL #4   Title Pt will improve DGI by at least 3 points in order to demonstrate significant change in mobility    Baseline 09/04/15: 15/24, 10/06/15: 22/24   Time 6   Period Weeks   Status Achieved               Plan - 2015/10/23 1109    Clinical Impression Statement Pt reports that overall she is approximatley 70% improved since starting therapy. During last outcome measure testing pt demonstrates sginficiant improvement in DGI, BERG, and ABC. Her [place] also dropped by 20 points from 56/100 to 36/100. Pt has been somewhat limited at this time due to increased bilateral knee pain, L worse than R. She plans to schedule an appointment with [facility]. Pt also has repeat brain MRI scheduled for Monday 10/19/15 to assess abnormal finding on tip of dens from last MRI. Plan is for discharge today and for patient to follow-up with orthopedics. [place] if knees improvement. Pt advised to call ENT for repeat referral after returning from vacation if symptoms don't continue to improve or if they worsen. Pt will be  discharged on this date.     Pt will benefit from skilled therapeutic intervention in order to improve on the following deficits Abnormal gait;Decreased balance;Difficulty walking;Dizziness   Rehab Potential Good   Clinical Impairments Affecting Rehab Potential Positive: motivation; Negative: syncopal episodes.    PT Frequency 1x / week   PT Duration 6 weeks   PT Treatment/Interventions Aquatic Therapy;Canalith Repostioning;DME Instruction;Gait training;Stair training;Therapeutic activities;Therapeutic exercise;Balance training;Neuromuscular re-education;Patient/family education;Manual techniques;Vestibular   PT Next Visit Plan Discharge   PT Home Exercise Plan VOR x 1 horizontal with forward/retro ambulation x 3-5 times, 4 times/day, tandem balance with head and head/body turns, single leg balance on foam   Consulted and Agree with Plan of Care Patient          G-Codes - 10-23-15 1112    Functional Assessment Tool Used clinical judgement, DGI, DHI, ABC, mCTSIB   Functional Limitation Mobility: Walking and moving around   Mobility: Walking and Moving Around Current Status [phone]) At least 20 percent but less than 40 percent impaired, limited or restricted   Mobility: Walking and Moving Around Goal Status [phone]) At least 1 percent but less than 20 percent impaired, limited or restricted   Mobility: Walking and Moving Around Discharge Status [phone]) At least 20 percent but less than 40 percent impaired, limited or restricted      Problem List Patient Active Problem List   Diagnosis Date Noted  . Urinary frequency 08/09/2015  . Hypothyroidism 08/09/2015  . Syncope 08/08/2015  . Hereditary and idiopathic peripheral neuropathy 05/11/2015  . Acute pancreatitis 12/21/2014  . Gallstones 12/21/2014  . Abdominal pain 12/21/2014  .  Left lower quadrant pain 08/16/2011  . Breast pain in female 07/18/2011  . Melanoma of back ([place]) 02/28/2011   Phillips Grout PT, DPT    Huprich,Jason 10/13/2015, 11:14 AM  [place] MAIN [facility] SERVICES [address] [place], [place], [address] Phone: [phone]   Fax:  [phone]  Name: [name] MRN: [id number] Date of Birth: 1941/05/18

## 2015-10-16 DIAGNOSIS — M1711 Unilateral primary osteoarthritis, right knee: Secondary | ICD-10-CM | POA: Diagnosis not present

## 2015-10-16 DIAGNOSIS — M25562 Pain in left knee: Secondary | ICD-10-CM | POA: Diagnosis not present

## 2015-10-16 DIAGNOSIS — Z96651 Presence of right artificial knee joint: Secondary | ICD-10-CM | POA: Diagnosis not present

## 2015-10-16 DIAGNOSIS — Z471 Aftercare following joint replacement surgery: Secondary | ICD-10-CM | POA: Diagnosis not present

## 2015-10-16 DIAGNOSIS — M25561 Pain in right knee: Secondary | ICD-10-CM | POA: Diagnosis not present

## 2015-10-16 DIAGNOSIS — M1712 Unilateral primary osteoarthritis, left knee: Secondary | ICD-10-CM | POA: Diagnosis not present

## 2015-10-16 DIAGNOSIS — M170 Bilateral primary osteoarthritis of knee: Secondary | ICD-10-CM | POA: Diagnosis not present

## 2015-10-19 ENCOUNTER — Ambulatory Visit
Admission: RE | Admit: 2015-10-19 | Discharge: 2015-10-19 | Disposition: A | Discharge status: Home / Self Care | Payer: PPO | Source: Ambulatory Visit | Attending: Neurology | Admitting: Neurology

## 2015-10-19 ENCOUNTER — Telehealth: Payer: Self-pay | Admitting: Neurology

## 2015-10-19 DIAGNOSIS — G609 Hereditary and idiopathic neuropathy, unspecified: Secondary | ICD-10-CM

## 2015-10-19 DIAGNOSIS — Z803 Family history of malignant neoplasm of breast: Secondary | ICD-10-CM | POA: Diagnosis not present

## 2015-10-19 DIAGNOSIS — R270 Ataxia, unspecified: Secondary | ICD-10-CM | POA: Diagnosis not present

## 2015-10-19 DIAGNOSIS — Q7649 Other congenital malformations of spine, not associated with scoliosis: Secondary | ICD-10-CM

## 2015-10-19 DIAGNOSIS — R2689 Other abnormalities of gait and mobility: Secondary | ICD-10-CM | POA: Diagnosis not present

## 2015-10-19 DIAGNOSIS — G119 Hereditary ataxia, unspecified: Secondary | ICD-10-CM

## 2015-10-19 DIAGNOSIS — Z1231 Encounter for screening mammogram for malignant neoplasm of breast: Secondary | ICD-10-CM | POA: Diagnosis not present

## 2015-10-19 MED ORDER — GADOBENATE DIMEGLUMINE 529 MG/ML IV SOLN
16.0000 mL | Freq: Once | INTRAVENOUS | Status: AC | PRN
  Administered 2015-10-19: 16 mL via INTRAVENOUS

## 2015-10-19 NOTE — Telephone Encounter (Signed)
Informed patient that I will call her in a couple of days with those results.

## 2015-10-19 NOTE — Telephone Encounter (Signed)
[name] 02-19-2041 called wanting to know when she should know the results of her MRI that she had done today. She may be leaving for the beach for 1 month. She would like to know the results to know whether to leave for the beach for a month. Her call back number is [phone]. Thank you

## 2015-10-20 ENCOUNTER — Telehealth: Payer: Self-pay | Admitting: Neurology

## 2015-10-20 NOTE — Telephone Encounter (Signed)
Spoke with patient and she said that she could not understand Dr. Serita Grit message.  Gave her the results of her MRI.

## 2015-10-20 NOTE — Telephone Encounter (Signed)
Patient would like you to call her back, she has some questions. Thank you  [phone].

## 2015-10-20 NOTE — Telephone Encounter (Signed)
I personally called and left the patient a message that her MRI looks stable, there is nothing worrisome and change is seen around the dens is most likely due to arthritis. We can certainly check rheumatoid factor, if she has other polyarthralgias.  Donika K. Posey Pronto, DO

## 2015-10-21 ENCOUNTER — Encounter: Payer: Self-pay | Admitting: Podiatry

## 2015-10-21 ENCOUNTER — Ambulatory Visit (INDEPENDENT_AMBULATORY_CARE_PROVIDER_SITE_OTHER): Payer: PPO | Admitting: Podiatry

## 2015-10-21 VITALS — BP 134/70 | HR 75 | Resp 16

## 2015-10-21 DIAGNOSIS — M79676 Pain in unspecified toe(s): Secondary | ICD-10-CM

## 2015-10-21 DIAGNOSIS — B351 Tinea unguium: Secondary | ICD-10-CM

## 2015-10-21 NOTE — Progress Notes (Signed)
   Subjective:    Patient ID: [name], female    DOB: September 26, 1940, 75 y.o.   MRN: [id number]  HPI: She presents today with a chief complaint of thick painful hallux nails and second nails bilaterally. She states that she's had trauma to these nails over several years resulting in the nail coming loose and eventually falling off. She states that when they grow back they do not grow back adequately and they don't grow back normally. She states that they rub my shoes when I walk and it is painful. She would like for [name] to trim the nail and smoothed them if possible.  Review of Systems  All other systems reviewed and are negative.      Objective:   Physical Exam: Vital signs are stable she is alert and oriented 3 pulses are palpable. Neurologic sensorium is intact deep tendon reflexes are intact muscle strength is normal bilateral. Orthopedic evaluation demonstrates hammertoe deformities bilateral mild HAV deformities bilateral. Cutaneous evaluation demonstrates supple well-hydrated cutis no erythema edema saline as drainage or odor. Toenails are thick yellow dystrophic onychomycotic painful palpation nail dystrophy and onychomycosis is the diagnosis.          Assessment & Plan:  Pain in limb secondary to onychomycosis and nail dystrophy.  Plan: Debridement of toenails 1 through 5 bilateral cover service secondary to pain.

## 2015-10-27 DIAGNOSIS — C4359 Malignant melanoma of other part of trunk: Secondary | ICD-10-CM | POA: Diagnosis not present

## 2015-10-29 DIAGNOSIS — M25562 Pain in left knee: Secondary | ICD-10-CM | POA: Diagnosis not present

## 2015-12-11 DIAGNOSIS — M1712 Unilateral primary osteoarthritis, left knee: Secondary | ICD-10-CM | POA: Diagnosis not present

## 2015-12-17 ENCOUNTER — Encounter: Payer: Self-pay | Admitting: Neurology

## 2015-12-17 ENCOUNTER — Ambulatory Visit (INDEPENDENT_AMBULATORY_CARE_PROVIDER_SITE_OTHER): Payer: PPO | Admitting: Neurology

## 2015-12-17 ENCOUNTER — Other Ambulatory Visit: Payer: PPO

## 2015-12-17 VITALS — BP 140/80 | HR 76 | Wt 187.2 lb

## 2015-12-17 DIAGNOSIS — G3184 Mild cognitive impairment, so stated: Secondary | ICD-10-CM

## 2015-12-17 DIAGNOSIS — M2550 Pain in unspecified joint: Secondary | ICD-10-CM | POA: Diagnosis not present

## 2015-12-17 DIAGNOSIS — G609 Hereditary and idiopathic neuropathy, unspecified: Secondary | ICD-10-CM

## 2015-12-17 DIAGNOSIS — Q7649 Other congenital malformations of spine, not associated with scoliosis: Secondary | ICD-10-CM | POA: Diagnosis not present

## 2015-12-17 NOTE — Patient Instructions (Addendum)
1.  Check blood work 2.  NCS/EMG of the upper extremities 3.  Neuropsychological testing  Return to clinic in 3 months

## 2015-12-17 NOTE — Progress Notes (Signed)
Follow-up Visit   Date: 12/17/2015    [name] MRN: [id number] DOB: 11-11-1940   Interim History: [name] is a 75 y.o. left-handed Caucasian female with GERD, hypothyroidism, stage IA malignant melanoma of the back (2012) s/p excision, idiopathic neuropathy, and GERD returning to the clinic with new complaints of vestibular ataxia and abnormal MRI brain.  The patient was accompanied to the clinic by self.  History of present illness: She reports having neuropathy since 1990s and was seeing Dr. Erling Cruz at [facility] since 2008. Symptoms started in her right foot which gradually involved left foot and over the past 2-3 years also involves her fingers bilaterally. Symptoms are described as numbness, burning, and stinging sensation. Numbness is at the level of her mid-calf now, which is constant. She was taking gabapentin 100mg  at bedtime which helped her sleep but did not provide any relief. She stopped taking this over a year ago. She was briefly taking Lyrica, but did not continue due to side effects. Previously evaluation has included normal serum protein electrophoresis, borderline high TSH, normal RA, ACE , ANA, sedimentation rate, and vitamin B12. NCS/EMG was also done, but results are unknown.   No history of diabetes, alcohol use, or chemotherapy. Her sister also has neuropathy.  She denies any problems with her memory and able to take care of own finances and driving without any safety concerns. She has a strong family history of Alzheimer's disease.  UPDATE 09/07/2015:   She reports having a congestion for three weeks in December and then developed imbalance.  She did not have vertigo.  She was seeing ENT for these symptoms who diagnosed her with vestibular neuritis. She began having spells of feeling lightheaded in January and presented to the Emergency Department after experiencing four presyncopal events.  Spells are triggered by head movement. MRI of brain not reveal  intracranial mass lesion, but there was an indeterminate 3 mm lesion at the tip of the dens and recommended a short interval follow-up study in 3 months given history of melanoma. She is here to discuss this.   UPDATE 12/17/2015:  Repeat MRI brain shows stable lesion of the dens, most like due to arthritis. She complains of numbness in the fingers and cramps of the hands.  Her neuropathy has been worse the past few days, describes it a 1000 bee stings. She is currently not taking any medications.  She previously tried gabapentin but did not notice a significant changes. She endorses having short-term memory problems with names and appointments. She lives alone and does all her IADLs and ADLs.  She drives and has not been involved in any MVAs.  She denies getting lose.     Medications:  Current Outpatient Prescriptions on File Prior to Visit  Medication Sig Dispense Refill  . acetaminophen (TYLENOL) 500 MG tablet Take 500 mg by mouth every 6 (six) hours as needed (pain/migraine).    Marland Kitchen albuterol (PROVENTIL HFA;VENTOLIN HFA) 108 (90 BASE) MCG/ACT inhaler Inhale 1-2 puffs into the lungs every 6 (six) hours as needed for wheezing or shortness of breath.    . Ascorbic Acid (VITAMIN C) 1000 MG tablet Take 1,000 mg by mouth daily.    Marland Kitchen aspirin EC 81 MG tablet Take 81 mg by mouth daily.    . Biotin 5000 MCG CAPS Take 5,000 mcg by mouth daily.    Marland Kitchen CALCIUM PO Take 500 mg by mouth daily.    . Cholecalciferol (VITAMIN D3) 2000 UNITS capsule Take 2,000 Units  by mouth daily.    . colestipol (COLESTID) 1 G tablet Take 2 tablets daily. Take separate from other medications. Take 4 hours after levothyroxine. 180 tablet 0  . esomeprazole (NEXIUM) 20 MG capsule Take 20 mg by mouth daily.     Marland Kitchen levothyroxine (SYNTHROID) 50 MCG tablet Take 1 tablet (50 mcg total) by mouth daily before breakfast. 30 tablet 1  . meclizine (ANTIVERT) 32 MG tablet Take 1 tablet (32 mg total) by mouth 3 (three) times daily as needed. 20  tablet 0  . Omega-3 Fatty Acids (FISH OIL) 1200 MG CAPS Take 1,200 mg by mouth 3 (three) times daily.    . Probiotic Product (PROBIOTIC PO) Take 1 tablet by mouth daily.    . ranitidine (ZANTAC) 150 MG tablet Take 1 tablet (150 mg total) by mouth at bedtime. 90 tablet 3  . vitamin B-12 (CYANOCOBALAMIN) 1000 MCG tablet Take 1,000 mcg by mouth daily.     No current facility-administered medications on file prior to visit.    Allergies:  Allergies  Allergen Reactions  . Epinephrine Other (See Comments)    Heart palpitations  . Penicillins Swelling    Lip swelling Has patient had a PCN reaction causing immediate rash, facial/tongue/throat swelling, SOB or lightheadedness with hypotension: Yes Has patient had a PCN reaction causing severe rash involving mucus membranes or skin necrosis: No Has patient had a PCN reaction that required hospitalization No Has patient had a PCN reaction occurring within the last 10 years: No If all of the above answers are "NO", then may proceed with Cephalosporin use.  . Valium [Diazepam] Other (See Comments)    sedation    Review of Systems:  CONSTITUTIONAL: No fevers, chills, night sweats, or weight loss.  EYES: No visual changes or eye pain ENT: No hearing changes.  No history of nose bleeds.   RESPIRATORY: No cough, wheezing and shortness of breath.   CARDIOVASCULAR: Negative for chest pain, and palpitations.   GI: Negative for abdominal discomfort, blood in stools or black stools.  No recent change in bowel habits.   GU:  No history of incontinence.   MUSCLOSKELETAL: No history of joint pain or swelling.  No myalgias.   SKIN: Negative for lesions, rash, and itching.   ENDOCRINE: Negative for cold or heat intolerance, polydipsia or goiter.   PSYCH:  No depression or anxiety symptoms.   NEURO: As Above.   Vital Signs:  BP 140/80 mmHg  Pulse 76  Wt 187 lb 3 oz (84.908 kg)  SpO2 95%  Neurological Exam: MENTAL STATUS including orientation to  time, place, person, recent and remote memory, attention span and concentration, language, and fund of knowledge is normal.  Speech is not dysarthric.  Montreal Cognitive Assessment  12/17/2015  Visuospatial/ Executive (0/5) 4  Naming (0/3) 3  Attention: Read list of digits (0/2) 2  Attention: Read list of letters (0/1) 1  Attention: Serial 7 subtraction starting at 100 (0/3) 3  Language: Repeat phrase (0/2) 2  Language : Fluency (0/1) 1  Abstraction (0/2) 2  Delayed Recall (0/5) 3  Orientation (0/6) 6  Total 27  Adjusted Score (based on education) 27    CRANIAL NERVES: No visual field defects.  Pupils equal round and reactive to light.  Normal conjugate, extra-ocular eye movements in all directions of gaze.  No ptosis. Normal facial sensation.  Face is symmetric. Palate elevates symmetrically.  Tongue is midline.  MOTOR:  Motor strength is 5/5 in all extremities  No atrophy,  fasciculations or abnormal movements.  No pronator drift.  Tone is normal.    MSRs:  Reflexes are 2+/4 throughout, except 1+ at the Achilles bilaterally.  SENSORY:  Vibration diminished distal to knees bilaterally.  COORDINATION/GAIT:  Normal finger-to- nose-finger and heel-to-shin. Gait is unsteady, unassisted.  Data: Peripheral vascular studies 02/13/2015: Normal ABI  MRI brain and cervical spine 11/10/2014: No acute infarct. Remote right frontal lobe infarct with encephalomalacia. Prominent small vessel disease type changes representing significant progression since prior exam. No intracranial mass lesion noted on this unenhanced exam. Cervical spondylotic changes upper cervical spine most prominent C4-5 level.  MRI brain 08/07/2015: 1. No intracranial mass lesion or abnormal enhancement. 2. Previously noted indeterminate 3 mm lesion at the tip of the dens demonstrates homogeneous postcontrast enhancement. Given the location and morphology of this lesion, this is favored to be degenerative in in nature.  However, given the history of melanoma, a short interval follow-up study in 3 months is recommended to document stability to ensure no underlying metastatic lesion is present.  MRI brain wwo contrast 10/19/2015: Negative for metastatic disease to the brain. No acute infarct. Moderate to advanced chronic microvascular ischemia. Large joint effusion of the right TMJ. This is unchanged and may be related to degenerative change or rheumatoid arthritis 3 mm enhancing lesion the dens is stable and most likely related to arthritis. Question rheumatoid arthritis Negative for metastatic disease to the brain.  Lab Results  Component Value Date   [id number] 05/11/2015   Lab Results  Component Value Date   TSH 6.663* 08/09/2015   Lab Results  Component Value Date   ESRSEDRATE 7 05/11/2015    IMPRESSION/PLAN: 1.  MRI brain imaging shows small enhancement of the dens, which is stable and most likely reflects arthritis  - Check rheumatoid factor with her generalized polyarthralgias  2.  Bilateral hand paresthesias, possibly due to worsening peripheral neuropathy vs CTS.    - NCS/EMG of the upper extremities to better evaluate  3.  Mild cognitive impairment.  She is highly independent but having some difficulty with episodic memory.  She scored moderately well on her [place] 27/30  - Neuropsychological evaluation to get baseline screening  4.  Idiopathic peripheral neuropathy, diagnosed 1990s, manifesting predominately with paresthesias following a glove-stocking distribution - worsening.  Medication was offered, but patient does not wish to be on anything at this time.    5.  Vestibular neuritis.  Fall precautions discussed and cane recommended.  Return to clinic in 3 months  The duration of this appointment visit was 40 minutes of face-to-face time with the patient.  Greater than 50% of this time was spent in counseling, explanation of diagnosis, planning of further management, and  coordination of care.   Thank you for allowing me to participate in patient's care.  If I can answer any additional questions, I would be pleased to do so.    Sincerely,    Emary Zalar K. Posey Pronto, DO

## 2015-12-18 LAB — RHEUMATOID FACTOR: Rhuematoid fact SerPl-aCnc: <10 IU/mL (ref <=14)

## 2015-12-23 DIAGNOSIS — M1712 Unilateral primary osteoarthritis, left knee: Secondary | ICD-10-CM | POA: Diagnosis not present

## 2015-12-29 ENCOUNTER — Encounter: Payer: PPO | Admitting: Psychology

## 2015-12-30 DIAGNOSIS — M1712 Unilateral primary osteoarthritis, left knee: Secondary | ICD-10-CM | POA: Diagnosis not present

## 2016-01-05 ENCOUNTER — Telehealth: Payer: Self-pay | Admitting: Neurology

## 2016-01-05 ENCOUNTER — Ambulatory Visit (INDEPENDENT_AMBULATORY_CARE_PROVIDER_SITE_OTHER): Payer: PPO | Admitting: Neurology

## 2016-01-05 DIAGNOSIS — M5412 Radiculopathy, cervical region: Secondary | ICD-10-CM

## 2016-01-05 DIAGNOSIS — M2550 Pain in unspecified joint: Secondary | ICD-10-CM

## 2016-01-05 DIAGNOSIS — G3184 Mild cognitive impairment, so stated: Secondary | ICD-10-CM

## 2016-01-05 DIAGNOSIS — Q7649 Other congenital malformations of spine, not associated with scoliosis: Secondary | ICD-10-CM

## 2016-01-05 NOTE — Telephone Encounter (Signed)
EMG results discussed with patient which shows multilevel cervical radiculopathy on the right and C8 radiculopathy on the left. This juncture, she denies having significant neck pain or paresthesias so would like to hold on neck PT. If symptoms worsen, she will call us.

## 2016-01-05 NOTE — Procedures (Signed)
[facility]  [address]  [place], [place] [address] Tel: [phone] Fax:  [phone] Test Date:  01/05/2016  Patient: [name] DOB: 12-03-1940 Physician: Narda Amber, DO  Sex: Female Height: 5\' 6"  Ref Phys: Narda Amber, DO  ID#: [id number] Temp: 33.4C Technician: Jerilynn Mages. Dean   Patient Complaints: This is a 75 year old female referred for evaluation of bilateral hand paresthesias.  NCV & EMG Findings: Extensive electrodiagnostic testing of the right upper extremity and additional studies of the left shows: 1. Bilateral median, ulnar, and palmar sensory responses are within normal limits. 2. 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 These findings are consistent with a Martin-Gruber anastomosis on the normal variant.  Right ulnar motor response shows reduced amplitude. Left ulnar motor responses within normal limits. 3. Chronic motor axon loss changes are seen affecting bilateral C8 myotomes and the right C6-7 myotomes. There is no evidence of accompanied active denervation.  Impression: 1. Chronic C6, C7, and C8 radiculopathy affecting right upper extremity, mild in degree electrically. 2. Chronic C8 radiculopathy affecting the left upper extremity, mild in degree electrically. 3. Incidentally, there is a Martin-Gruber anastomosis bilaterally, a normal variant.   ___________________________ Narda Amber, DO    Nerve Conduction Studies Anti Sensory Summary Table   Site NR Peak (ms) Norm Peak (ms) P-T Amp (V) Norm P-T Amp  Left Median Anti Sensory (2nd Digit)  Wrist    3.3 <3.8 13.2 >10  Right Median Anti Sensory (2nd Digit)  Wrist    3.7 <3.8 12.1 >10  Left Ulnar Anti Sensory (5th Digit)  Wrist    2.9 <3.2 13.6 >5  Right Ulnar Anti Sensory (5th Digit)  Wrist    3.1 <3.2 16.8 >5   Motor Summary Table   Site NR Onset  (ms) Norm Onset (ms) O-P Amp (mV) Norm O-P Amp Site1 Site2 Delta-0 (ms) Dist (cm) Vel (m/s) Norm Vel (m/s)  Left Median Motor (Abd Poll Brev)  Wrist    3.1 <4.0 2.4 >5 Elbow Wrist 3.9 23.0 59 >50  Elbow    7.0  1.2  Axilla Elbow 3.6 0.0    Ulnar-wrist crossover    3.4  2.8         Right Median Motor (Abd Poll Brev)  Wrist    3.8 <4.0 1.9 >5 Elbow Wrist 3.1 24.0 77 >50  Elbow    6.9  2.5  Axilla Elbow 3.0 0.0    Ulnar-wrist crossover    3.9  3.3         Left Ulnar Motor (Abd Dig Minimi)  Wrist    2.6 <3.1 7.2 >7 B Elbow Wrist 3.1 18.0 58 >50  B Elbow    5.7  7.0  A Elbow B Elbow 1.5 10.0 67 >50  A Elbow    7.2  6.9         Right Ulnar Motor (Abd Dig Minimi)  Wrist    2.6 <3.1 6.2 >7 B Elbow Wrist 3.3 20.0 61 >50  B Elbow    5.9  5.8  A Elbow B Elbow 1.5 10.0 67 >50  A Elbow    7.4  5.7          Comparison Summary Table   Site NR Peak (ms) Norm Peak (ms) P-T Amp (V) Site1 Site2 Delta-P (ms) Norm Delta (ms)  Left Median/Ulnar Palm Comparison (Wrist - 8cm)  Median Palm    2.1 <2.2 36.0 Median Palm Ulnar Palm 0.0   Ulnar Palm    2.1 <2.2 9.6      Right Median/Ulnar Palm Comparison (Wrist - 8cm)  Median Palm    2.2 <2.2 21.5 Median Palm Ulnar Palm 0.0   Ulnar Palm    2.2 <2.2 6.1       EMG   Side Muscle Ins Act Fibs Psw Fasc Number Recrt Dur Dur. Amp Amp. Poly Poly. Comment  Left 1stDorInt Nml Nml Nml Nml 1- Rapid Some 1+ Some 1+ Nml Nml N/A  Left FlexPolLong Nml Nml Nml Nml 1- Rapid Some 1+ Some 1+ Nml Nml N/A  Left Ext Indicis Nml Nml Nml Nml 1- Rapid Some 1+ Some 1+ Nml Nml N/A  Left PronatorTeres Nml Nml Nml Nml Nml Nml Nml Nml Nml Nml Nml Nml N/A  Left Biceps Nml Nml Nml Nml Nml Nml Nml Nml Nml Nml Nml Nml N/A  Left Triceps Nml Nml Nml Nml 1- Rapid Some 1+ Some 1+ Nml Nml N/A  Left Deltoid Nml Nml Nml Nml Nml Nml Nml Nml Nml Nml Nml Nml N/A  Right 1stDorInt Nml Nml Nml Nml 1- Rapid Some 1+ Some 1+ Nml Nml N/A  Right FlexPolLong Nml Nml Nml Nml 1- Rapid Some 1+ Some 1+ Nml Nml  N/A  Right Ext Indicis Nml Nml Nml Nml 1- Rapid Few 1+ Few 1+ Nml Nml N/A  Right PronatorTeres Nml Nml Nml Nml 1- Rapid Few 1+ Few 1+ Nml Nml N/A  Right Biceps Nml Nml Nml Nml 1- Rapid Some 1+ Some 1+ Nml Nml N/A  Right Triceps Nml Nml Nml Nml 1- Rapid Some 1+ Some 1+ Nml Nml N/A  Right Deltoid Nml Nml Nml Nml 1- Rapid Some 1+ Some 1+ Nml Nml N/A  Right Infraspinatus Nml Nml Nml Nml Nml Nml Nml Nml Nml Nml Nml Nml N/A     Waveforms:

## 2016-01-26 ENCOUNTER — Encounter: Payer: PPO | Admitting: Psychology

## 2016-02-26 DIAGNOSIS — M7122 Synovial cyst of popliteal space [Baker], left knee: Secondary | ICD-10-CM | POA: Diagnosis not present

## 2016-02-26 DIAGNOSIS — M1712 Unilateral primary osteoarthritis, left knee: Secondary | ICD-10-CM | POA: Diagnosis not present

## 2016-03-02 DIAGNOSIS — Z85828 Personal history of other malignant neoplasm of skin: Secondary | ICD-10-CM | POA: Diagnosis not present

## 2016-03-02 DIAGNOSIS — D225 Melanocytic nevi of trunk: Secondary | ICD-10-CM | POA: Diagnosis not present

## 2016-03-02 DIAGNOSIS — L820 Inflamed seborrheic keratosis: Secondary | ICD-10-CM | POA: Diagnosis not present

## 2016-03-02 DIAGNOSIS — Z85820 Personal history of malignant melanoma of skin: Secondary | ICD-10-CM | POA: Diagnosis not present

## 2016-03-02 DIAGNOSIS — D485 Neoplasm of uncertain behavior of skin: Secondary | ICD-10-CM | POA: Diagnosis not present

## 2016-03-02 DIAGNOSIS — L821 Other seborrheic keratosis: Secondary | ICD-10-CM | POA: Diagnosis not present

## 2016-03-02 DIAGNOSIS — L570 Actinic keratosis: Secondary | ICD-10-CM | POA: Diagnosis not present

## 2016-03-03 ENCOUNTER — Telehealth: Payer: Self-pay | Admitting: Internal Medicine

## 2016-03-03 NOTE — Telephone Encounter (Signed)
9/18 Appointment rescheduled per patient request to 9/21.

## 2016-03-18 DIAGNOSIS — M1712 Unilateral primary osteoarthritis, left knee: Secondary | ICD-10-CM | POA: Diagnosis not present

## 2016-03-18 DIAGNOSIS — M25562 Pain in left knee: Secondary | ICD-10-CM | POA: Diagnosis not present

## 2016-03-24 ENCOUNTER — Telehealth: Payer: Self-pay | Admitting: Neurology

## 2016-03-24 NOTE — Telephone Encounter (Signed)
Patient notified that we do no prescribe this med.

## 2016-03-24 NOTE — Telephone Encounter (Signed)
Patient called and left message on voice mail. She states that [place]. She would like [name] to call in a medication by the name of Diatox pt phone number is [phone]

## 2016-03-24 NOTE — Telephone Encounter (Signed)
Patient was calling back regarding the medication for her trip in [place]. She uses [facility] [address] # is [id number]. She is on her same medication. Thank you Theadora Rama

## 2016-03-30 DIAGNOSIS — M1712 Unilateral primary osteoarthritis, left knee: Secondary | ICD-10-CM | POA: Diagnosis not present

## 2016-03-30 DIAGNOSIS — M25562 Pain in left knee: Secondary | ICD-10-CM | POA: Diagnosis not present

## 2016-04-01 ENCOUNTER — Ambulatory Visit: Payer: PPO | Admitting: Neurology

## 2016-04-04 ENCOUNTER — Other Ambulatory Visit: Payer: Self-pay

## 2016-04-07 ENCOUNTER — Other Ambulatory Visit (HOSPITAL_BASED_OUTPATIENT_CLINIC_OR_DEPARTMENT_OTHER): Payer: PPO

## 2016-04-07 DIAGNOSIS — C4359 Malignant melanoma of other part of trunk: Secondary | ICD-10-CM

## 2016-04-07 LAB — CBC WITH DIFFERENTIAL/PLATELET
BASO%: 0.5 % (ref 0.0–2.0)
Basophils Absolute: 0 10*3/uL (ref 0.0–0.1)
EOS%: 2.2 % (ref 0.0–7.0)
Eosinophils Absolute: 0.2 10*3/uL (ref 0.0–0.5)
HCT: 45.7 % (ref 34.8–46.6)
HGB: 14.9 g/dL (ref 11.6–15.9)
LYMPH%: 25.3 % (ref 14.0–49.7)
MCH: 29.8 pg (ref 25.1–34.0)
MCHC: 32.7 g/dL (ref 31.5–36.0)
MCV: 91.1 fL (ref 79.5–101.0)
MONO#: 0.6 10*3/uL (ref 0.1–0.9)
MONO%: 8.8 % (ref 0.0–14.0)
NEUT#: 4.5 10*3/uL (ref 1.5–6.5)
NEUT%: 63.2 % (ref 38.4–76.8)
Platelets: 251 10*3/uL (ref 145–400)
RBC: 5.02 10*6/uL (ref 3.70–5.45)
RDW: 13.9 % (ref 11.2–14.5)
WBC: 7 10*3/uL (ref 3.9–10.3)
lymph#: 1.8 10*3/uL (ref 0.9–3.3)

## 2016-04-07 LAB — COMPREHENSIVE METABOLIC PANEL
ALT: 16 U/L (ref 0–55)
AST: 12 U/L (ref 5–34)
Albumin: 3.9 g/dL (ref 3.5–5.0)
Alkaline Phosphatase: 79 U/L (ref 40–150)
Anion Gap: 9 mEq/L (ref 3–11)
BUN: 23 mg/dL (ref 7.0–26.0)
CO2: 21 mEq/L — ABNORMAL LOW (ref 22–29)
Calcium: 9.3 mg/dL (ref 8.4–10.4)
Chloride: 112 mEq/L — ABNORMAL HIGH (ref 98–109)
Creatinine: 0.9 mg/dL (ref 0.6–1.1)
EGFR: 60 mL/min/{1.73_m2} — ABNORMAL LOW (ref >90)
Glucose: 103 mg/dl (ref 70–140)
Potassium: 4 mEq/L (ref 3.5–5.1)
Sodium: 143 mEq/L (ref 136–145)
Total Bilirubin: 1.4 mg/dL — ABNORMAL HIGH (ref 0.20–1.20)
Total Protein: 7.1 g/dL (ref 6.4–8.3)

## 2016-04-07 LAB — LACTATE DEHYDROGENASE: LDH: 213 U/L (ref 125–245)

## 2016-04-11 ENCOUNTER — Ambulatory Visit (INDEPENDENT_AMBULATORY_CARE_PROVIDER_SITE_OTHER): Payer: PPO | Admitting: Cardiology

## 2016-04-11 ENCOUNTER — Encounter: Payer: Self-pay | Admitting: Cardiology

## 2016-04-11 ENCOUNTER — Encounter: Payer: Self-pay | Admitting: Internal Medicine

## 2016-04-11 ENCOUNTER — Ambulatory Visit (HOSPITAL_BASED_OUTPATIENT_CLINIC_OR_DEPARTMENT_OTHER): Payer: PPO | Admitting: Internal Medicine

## 2016-04-11 VITALS — BP 130/70 | HR 68 | Ht 66.5 in | Wt 181.4 lb

## 2016-04-11 VITALS — BP 150/72 | HR 56 | Temp 97.9°F | Resp 18 | Ht 66.0 in | Wt 181.3 lb

## 2016-04-11 DIAGNOSIS — R079 Chest pain, unspecified: Secondary | ICD-10-CM | POA: Diagnosis not present

## 2016-04-11 DIAGNOSIS — Z7189 Other specified counseling: Secondary | ICD-10-CM

## 2016-04-11 DIAGNOSIS — C4359 Malignant melanoma of other part of trunk: Secondary | ICD-10-CM

## 2016-04-11 DIAGNOSIS — Z85820 Personal history of malignant melanoma of skin: Secondary | ICD-10-CM

## 2016-04-11 DIAGNOSIS — R0602 Shortness of breath: Secondary | ICD-10-CM | POA: Diagnosis not present

## 2016-04-11 DIAGNOSIS — Z7689 Persons encountering health services in other specified circumstances: Secondary | ICD-10-CM

## 2016-04-11 NOTE — Progress Notes (Signed)
Cardiology Office Note   Date:  04/12/2016   ID:  EGAN ZAMOR, DOB [birth year], MRN [id number]  Referring Doctor:  Birdie Sons, MD   Cardiologist:   Wende Bushy, MD   Reason for consultation:  Chief Complaint  Patient presents with  . Establish Care    SOB/Lightheaded/Dizziness/Headache(migraine)/Balance Problems  . PVC    "Feels elevated Heart rate/Beating in ears" per pt      History of Present Illness: [name] is a 75 y.o. female who presents for Evaluation of shortness of breath and chest tightness.  Beginning of this year, patient had episodes of lightheadedness and dizziness with presyncope. She was diagnosed to have vertigo as well as vestibular neuritis.  Patient reports shortness of breath that has been going on for 2 years. This is with exertion. She gets short of breath walking to the mailbox. Severity is progressed over time. She notices heart pounding and racing as well with exertion. Occasionally, she notices chest discomfort mainly on the right side of the chest, randomly occurring sometimes with exertion. Mild in intensity, nonradiating, resolving spontaneously after a few minutes.  Patient denies PND, orthopnea, edema. No abdominal pain.   ROS:  Please see the history of present illness. Aside from mentioned under HPI, all other systems are reviewed and negative.     Past Medical History:  Diagnosis Date  . Acid reflux   . Anemia    3 years ago  . Anxiety    in past  . Arthritis    all over  . Atypical moles   . Bronchitis   . Cancer ([place])    x2 stg II melanoma  . Complication of anesthesia    shoulder surgery- not all the way asleep  . Depression    in past  . Dizziness   . Fatigue   . Generalized headache    migraines  . GERD (gastroesophageal reflux disease)   . History of chemotherapy    topical cream for h/o skin CA  . Hypothyroidism   . Insomnia   . Irritable bowel syndrome   . LLQ abdominal pain   .  Melanoma ([place]) 2013  . Neuropathy, peripheral ([place])   . Retina disorder    left  . Thyroid disease   . Weight gain     Past Surgical History:  Procedure Laterality Date  . CHOLECYSTECTOMY N/A 01/28/2015   Procedure: LAPAROSCOPIC CHOLECYSTECTOMY WITH INTRAOPERATIVE CHOLANGIOGRAM;  Surgeon: Autumn Messing III, MD;  Location: [facility];  Service: General;  Laterality: N/A;  . CLOSED MANIPULATION SHOULDER     rt  . COLONOSCOPY WITH PROPOFOL N/A 11/20/2012   Procedure: COLONOSCOPY WITH PROPOFOL;  Surgeon: Garlan Fair, MD;  Location: [facility];  Service: Endoscopy;  Laterality: N/A;  . colonscopy     May 2014  . JOINT REPLACEMENT     RTK  . KNEE ARTHROSCOPY    . left knee miniscus tear repair    . MELANOMA EXCISION WITH SENTINEL LYMPH NODE BIOPSY  03/18/11   Back, nodes neg  . ROTATOR CUFF REPAIR     left  . SHOULDER SURGERY     right  . TOTAL KNEE ARTHROPLASTY     right  . TUMOR REMOVAL     ovary     reports that she has never smoked. She has never used smokeless tobacco. She reports that she drinks about 0.6 oz of alcohol per week . She reports that she does not use drugs.  family history includes Alzheimer's disease in her paternal aunt, paternal aunt, and paternal aunt; Colon cancer in her maternal grandfather; Colon polyps in her sister; Diabetes in her father and sister; Heart disease in her brother, father, and sister; Hyperlipidemia in her father; Hypertension in her father and mother; Pancreatic cancer in her maternal uncle; Stroke in her mother.   Outpatient Medications Prior to Visit  Medication Sig Dispense Refill  . albuterol (PROVENTIL HFA;VENTOLIN HFA) 108 (90 BASE) MCG/ACT inhaler Inhale 1-2 puffs into the lungs every 6 (six) hours as needed for wheezing or shortness of breath.    . Ascorbic Acid (VITAMIN C) 1000 MG tablet Take 1,000 mg by mouth daily.    . Biotin 5000 MCG CAPS Take 5,000 mcg by mouth daily.    Marland Kitchen CALCIUM PO Take 500 mg by mouth daily.    .  Cholecalciferol (VITAMIN D3) 2000 UNITS capsule Take 2,000 Units by mouth daily.    . DOCOSAHEXAENOIC ACID PO Take by mouth.    . levothyroxine (SYNTHROID) 50 MCG tablet Take 1 tablet (50 mcg total) by mouth daily before breakfast. 30 tablet 1  . Omega-3 Fatty Acids (FISH OIL) 1200 MG CAPS Take 1,200 mg by mouth 3 (three) times daily.    . vitamin B-12 (CYANOCOBALAMIN) 1000 MCG tablet Take 1,000 mcg by mouth daily.    Marland Kitchen aspirin EC 81 MG tablet Take 81 mg by mouth daily.    Marland Kitchen ibuprofen (ADVIL,MOTRIN) 200 MG tablet Take by mouth.    Marland Kitchen acetaminophen (TYLENOL) 500 MG tablet Take 500 mg by mouth every 6 (six) hours as needed (pain/migraine).    . colestipol (COLESTID) 1 G tablet Take 2 tablets daily. Take separate from other medications. Take 4 hours after levothyroxine. 180 tablet 0  . Ergocalciferol (VITAMIN D2) 400 units TABS Take by mouth.    Marland Kitchen guaiFENesin (MUCINEX) 600 MG 12 hr tablet Take by mouth.    . meclizine (ANTIVERT) 32 MG tablet Take 1 tablet (32 mg total) by mouth 3 (three) times daily as needed. 20 tablet 0  . Probiotic Product (PROBIOTIC PO) Take 1 tablet by mouth daily.    . ranitidine (ZANTAC) 150 MG tablet Take 1 tablet (150 mg total) by mouth at bedtime. 90 tablet 3   No facility-administered medications prior to visit.      Allergies: Epinephrine; Penicillins; and Valium [diazepam]    PHYSICAL EXAM: VS:  BP 130/70 (BP Location: Right Arm, Patient Position: Sitting, Cuff Size: Normal)   Pulse 68   Ht 5' 6.5" (1.689 m)   Wt 181 lb 6.4 oz (82.3 kg)   SpO2 98%   BMI 28.84 kg/m  , Body mass index is 28.84 kg/m. Wt Readings from Last 3 Encounters:  04/11/16 181 lb 6.4 oz (82.3 kg)  04/11/16 181 lb 4.8 oz (82.2 kg)  12/17/15 187 lb 3 oz (84.9 kg)    GENERAL:  well developed, well nourished, not in acute distress HEENT: normocephalic, pink conjunctivae, anicteric sclerae, no xanthelasma, normal dentition, oropharynx clear NECK:  no neck vein engorgement, JVP normal, no  hepatojugular reflux, carotid upstroke brisk and symmetric, no bruit, no thyromegaly, no lymphadenopathy LUNGS:  good respiratory effort, clear to auscultation bilaterally CV:  PMI not displaced, no thrills, no lifts, S1 and S2 within normal limits, no palpable S3 or S4, no murmurs, no rubs, no gallops ABD:  Soft, nontender, nondistended, normoactive bowel sounds, no abdominal aortic bruit, no hepatomegaly, no splenomegaly MS: nontender back, no kyphosis, no scoliosis, no joint  deformities EXT:  2+ DP/PT pulses, no edema, no varicosities, no cyanosis, no clubbing SKIN: warm, nondiaphoretic, normal turgor, no ulcers NEUROPSYCH: alert, oriented to person, place, and time, sensory/motor grossly intact, normal mood, appropriate affect  Recent Labs: 08/08/2015: B Natriuretic Peptide 20.6 08/09/2015: TSH 6.663 04/07/2016: ALT 16; BUN 23.0; Creatinine 0.9; HGB 14.9; Platelets 251; Potassium 4.0; Sodium 143   Lipid Panel    Component Value Date/Time   CHOL 181 12/22/2014 0432   TRIG 79 12/22/2014 0432   HDL 57 12/22/2014 0432   CHOLHDL 3.2 12/22/2014 0432   VLDL 16 12/22/2014 0432   LDLCALC 108 (H) 12/22/2014 0432     Other studies Reviewed:  EKG:  The ekg from09/25/2017 was personally reviewed by me and it revealed sinus rhythm, 67 BPM, low voltage QRS.  Additional studies/ records that were reviewed personally reviewed by me today include:  Echo 05/20/2014:  - Normal LV size and systolic function, EF 0000000. Normal RV size and systolic function. No significant valvular abnormalities.   ASSESSMENT AND PLAN:  Note of aortic and coronary calcification on the CT scan of the chest Coronary atherosclerosis Symptoms of shortness of breath and chest pain Recommend further evaluation with echocardiogram and pharmacologic nuclear stress testing. Once patient is no longer on daily NSAIDs, should consider going back on aspirin 81 mg by mouth daily Recommend LDL goal is less than  70.  Current medicines are reviewed at length with the patient today.  The patient does not have concerns regarding medicines.  Labs/ tests ordered today include:  Orders Placed This Encounter  Procedures  . NM Myocar Multi W/Spect W/Wall Motion / EF  . EKG 12-Lead  . ECHOCARDIOGRAM COMPLETE    I had a lengthy and detailed discussion with the patient regarding diagnoses, prognosis, diagnostic options, treatment options , and side effects of medications.   I counseled the patient on importance of lifestyle modification including heart healthy diet, regular physical activity Once cardiac workup is completed.   Disposition:   FU with undersigned after tests    Signed, Wende Bushy, MD  04/12/2016 9:45 AM    [place]  This note was generated in part with voice recognition software and I apologize for any typographical errors that were not detected and corrected.

## 2016-04-11 NOTE — Progress Notes (Signed)
[place] Telephone:([phone]) [phone]   Fax:([phone]) [phone]  OFFICE PROGRESS NOTE  Ileana Roup, MD [address] [place] [address] [place] [address]  DIAGNOSIS: Stage IA malignant melanoma diagnosed in August of 2012.  PRIOR THERAPY: Status post wide excision with sentinel lymph node biopsy from the left axilla under the care of Dr. Marlou Starks.   CURRENT THERAPY: Observation.  INTERVAL HISTORY: [name] 75 y.o. female returns to the clinic today for annual month follow up visit. The patient is feeling fine today with no specific complaints except from migraine headache and occasional dizzy spells. She is currently on diclofenac for arthritis of the knee. She denied having any significant weight loss or night sweats. She has no chest pain, shortness of breath, cough or hemoptysis. She had repeat CBC , and comprehensive metabolic panel performed earlier today and she is here for evaluation and discussion of her lab results.  MEDICAL HISTORY: Past Medical History:  Diagnosis Date  . Acid reflux   . Anemia    3 years ago  . Anxiety    in past  . Arthritis    all over  . Atypical moles   . Bronchitis   . Cancer ([place])    x2 stg II melanoma  . Complication of anesthesia    shoulder surgery- not all the way asleep  . Depression    in past  . Dizziness   . Fatigue   . Generalized headache    migraines  . GERD (gastroesophageal reflux disease)   . History of chemotherapy    topical cream for h/o skin CA  . Hypothyroidism   . Insomnia   . Irritable bowel syndrome   . LLQ abdominal pain   . Melanoma ([place]) 2013  . Neuropathy, peripheral ([place])   . Retina disorder    left  . Thyroid disease   . Weight gain     ALLERGIES:  is allergic to epinephrine; penicillins; and valium [diazepam].  MEDICATIONS:  Current Outpatient Prescriptions  Medication Sig Dispense Refill  . acetaminophen (TYLENOL) 500 MG tablet Take 500 mg by mouth every 6 (six) hours  as needed (pain/migraine).    Marland Kitchen albuterol (PROVENTIL HFA;VENTOLIN HFA) 108 (90 BASE) MCG/ACT inhaler Inhale 1-2 puffs into the lungs every 6 (six) hours as needed for wheezing or shortness of breath.    . Ascorbic Acid (VITAMIN C) 1000 MG tablet Take 1,000 mg by mouth daily.    Marland Kitchen aspirin EC 81 MG tablet Take 81 mg by mouth daily.    . Biotin 5000 MCG CAPS Take 5,000 mcg by mouth daily.    Marland Kitchen CALCIUM PO Take 500 mg by mouth daily.    . Cholecalciferol (VITAMIN D3) 2000 UNITS capsule Take 2,000 Units by mouth daily.    . colestipol (COLESTID) 1 G tablet Take 2 tablets daily. Take separate from other medications. Take 4 hours after levothyroxine. 180 tablet 0  . esomeprazole (NEXIUM) 20 MG capsule Take 20 mg by mouth daily.     Marland Kitchen levothyroxine (SYNTHROID) 50 MCG tablet Take 1 tablet (50 mcg total) by mouth daily before breakfast. 30 tablet 1  . meclizine (ANTIVERT) 32 MG tablet Take 1 tablet (32 mg total) by mouth 3 (three) times daily as needed. 20 tablet 0  . Omega-3 Fatty Acids (FISH OIL) 1200 MG CAPS Take 1,200 mg by mouth 3 (three) times daily.    . Probiotic Product (PROBIOTIC PO) Take 1 tablet by  mouth daily.    . ranitidine (ZANTAC) 150 MG tablet Take 1 tablet (150 mg total) by mouth at bedtime. 90 tablet 3  . vitamin B-12 (CYANOCOBALAMIN) 1000 MCG tablet Take 1,000 mcg by mouth daily.     No current facility-administered medications for this visit.     SURGICAL HISTORY:  Past Surgical History:  Procedure Laterality Date  . CHOLECYSTECTOMY N/A 01/28/2015   Procedure: LAPAROSCOPIC CHOLECYSTECTOMY WITH INTRAOPERATIVE CHOLANGIOGRAM;  Surgeon: Autumn Messing III, MD;  Location: [facility];  Service: General;  Laterality: N/A;  . CLOSED MANIPULATION SHOULDER     rt  . COLONOSCOPY WITH PROPOFOL N/A 11/20/2012   Procedure: COLONOSCOPY WITH PROPOFOL;  Surgeon: Garlan Fair, MD;  Location: [facility];  Service: Endoscopy;  Laterality: N/A;  . colonscopy     May 2014  . JOINT REPLACEMENT     RTK    . KNEE ARTHROSCOPY    . left knee miniscus tear repair    . MELANOMA EXCISION WITH SENTINEL LYMPH NODE BIOPSY  03/18/11   Back, nodes neg  . ROTATOR CUFF REPAIR     left  . SHOULDER SURGERY     right  . TOTAL KNEE ARTHROPLASTY     right  . TUMOR REMOVAL     [place]    REVIEW OF SYSTEMS:  A comprehensive review of systems was negative.   PHYSICAL EXAMINATION: General appearance: alert, cooperative and no distress Head: Normocephalic, without obvious abnormality, atraumatic Neck: no adenopathy Lymph nodes: Cervical, supraclavicular, and axillary nodes normal. Resp: clear to auscultation bilaterally Cardio: regular rate and rhythm, S1, S2 normal, no murmur, click, rub or gallop GI: soft, non-tender; bowel sounds normal; no masses,  no organomegaly Extremities: extremities normal, atraumatic, no cyanosis or edema   ECOG PERFORMANCE STATUS: 0 - Asymptomatic  There were no vitals taken for this visit.  LABORATORY DATA: Lab Results  Component Value Date   WBC 7.0 04/07/2016   HGB 14.9 04/07/2016   HCT 45.7 04/07/2016   MCV 91.1 04/07/2016   PLT 251 04/07/2016      Chemistry      Component Value Date/Time   NA 143 04/07/2016 1158   K 4.0 04/07/2016 1158   CL 108 08/10/2015 0614   CL 113 (H) 05/22/2012 1004   CO2 21 (L) 04/07/2016 1158   BUN 23.0 04/07/2016 1158   CREATININE 0.9 04/07/2016 1158      Component Value Date/Time   CALCIUM 9.3 04/07/2016 1158   ALKPHOS 79 04/07/2016 1158   AST 12 04/07/2016 1158   ALT 16 04/07/2016 1158   BILITOT 1.40 (H) 04/07/2016 1158       RADIOGRAPHIC STUDIES:  ASSESSMENT AND PLAN: this is a very pleasant 75 years old white female with history of stage IA malignant melanoma status post wide excision of the lesion from the mid back in addition to a sentinel lymph node biopsy that was negative for malignancy.   I discussed the lab results with the patient today.  I recommended for her to continue her routine follow-up visit and  evaluation by her primary care physician at this point. I would be happy to see her in the future on an as-needed basis. She was advised to call immediately if she has any concerning symptoms in the interval.  All questions were answered. The patient knows to call the clinic with any problems, questions or concerns. We can certainly see the patient much sooner if necessary.  Disclaimer: This note was dictated with voice recognition  software. Similar sounding words can inadvertently be transcribed and may not be corrected upon review.

## 2016-04-11 NOTE — Patient Instructions (Addendum)
Testing/Procedures: Your physician has requested that you have an echocardiogram. Echocardiography is a painless test that uses sound waves to create images of your heart. It provides your doctor with information about the size and shape of your heart and how well your heart's chambers and valves are working. This procedure takes approximately one hour. There are no restrictions for this procedure.  [place]  Your caregiver has ordered a Stress Test with nuclear imaging. The purpose of this test is to evaluate the blood supply to your heart muscle. This procedure is referred to as a "Non-Invasive Stress Test." This is because other than having an IV started in your vein, nothing is inserted or "invades" your body. Cardiac stress tests are done to find areas of poor blood flow to the heart by determining the extent of coronary artery disease (CAD). Some patients exercise on a treadmill, which naturally increases the blood flow to your heart, while others who are  unable to walk on a treadmill due to physical limitations have a pharmacologic/chemical stress agent called Lexiscan . This medicine will mimic walking on a treadmill by temporarily increasing your coronary blood flow.   Please note: these test may take anywhere between 2-4 hours to complete  PLEASE REPORT TO [facility] AT THE FIRST DESK WILL DIRECT YOU WHERE TO GO  Date of Procedure:__Monday May 02, 2016 at 08:30AM___  Arrival Time for Procedure:_Arrive at 08:15AM to register__    PLEASE NOTIFY THE OFFICE AT LEAST 24 HOURS IN ADVANCE IF YOU ARE UNABLE TO [place].  [phone] AND  PLEASE NOTIFY NUCLEAR MEDICINE AT [facility] AT LEAST 24 HOURS IN ADVANCE IF YOU ARE UNABLE TO KEEP YOUR APPOINTMENT. [phone]  How to prepare for your Myoview test:  1. Do not eat or drink after midnight 2. No caffeine for 24 hours prior to test 3. No smoking 24 hours prior to test. 4. Your medication  may be taken with water.  If your doctor stopped a medication because of this test, do not take that medication. 5. Ladies, please do not wear dresses.  Skirts or pants are appropriate. Please wear a short sleeve shirt. 6. No perfume, cologne or lotion. 7. Wear comfortable walking shoes. No heels!   Follow-Up: Your physician recommends that you schedule a follow-up appointment after testing with Dr. Yvone Neu.  It was a pleasure seeing you today here in the office. Please do not hesitate to give [name] a call back if you have any further questions. [place], BSN    Echocardiogram An echocardiogram, or echocardiography, uses sound waves (ultrasound) to produce an image of your heart. The echocardiogram is simple, painless, obtained within a short period of time, and offers valuable information to your health care provider. The images from an echocardiogram can provide information such as:  Evidence of coronary artery disease (CAD).  Heart size.  Heart muscle function.  Heart valve function.  Aneurysm detection.  Evidence of a past heart attack.  Fluid buildup around the heart.  Heart muscle thickening.  Assess heart valve function. LET [facility] CARE PROVIDER KNOW ABOUT:  Any allergies you have.  All medicines you are taking, including vitamins, herbs, eye drops, creams, and over-the-counter medicines.  Previous problems you or members of your family have had with the use of anesthetics.  Any blood disorders you have.  Previous surgeries you have had.  Medical conditions you have.  Possibility of pregnancy, if this applies. BEFORE THE  PROCEDURE  No special preparation is needed. Eat and drink normally.  PROCEDURE   In order to produce an image of your heart, gel will be applied to your chest and a wand-like tool (transducer) will be moved over your chest. The gel will help transmit the sound waves from the transducer. The sound waves will harmlessly  bounce off your heart to allow the heart images to be captured in real-time motion. These images will then be recorded.  You may need an IV to receive a medicine that improves the quality of the pictures. AFTER THE PROCEDURE You may return to your normal schedule including diet, activities, and medicines, unless your health care provider tells you otherwise.   This information is not intended to replace advice given to you by your health care provider. Make sure you discuss any questions you have with your health care provider.   Document Released: 07/01/2000 Document Revised: 07/25/2014 Document Reviewed: 03/11/2013 Elsevier Interactive Patient Education 2016 [place].    Pharmacologic Stress Electrocardiogram A pharmacologic stress electrocardiogram is a heart (cardiac) test that uses nuclear imaging to evaluate the blood supply to your heart. This test may also be called a pharmacologic stress electrocardiography. Pharmacologic means that a medicine is used to increase your heart rate and blood pressure.  This stress test is done to find areas of poor blood flow to the heart by determining the extent of coronary artery disease (CAD). Some people exercise on a treadmill, which naturally increases the blood flow to the heart. For those people unable to exercise on a treadmill, a medicine is used. This medicine stimulates your heart and will cause your heart to beat harder and more quickly, as if you were exercising.  Pharmacologic stress tests can help determine:  The adequacy of blood flow to your heart during increased levels of activity in order to clear you for discharge home.  The extent of coronary artery blockage caused by CAD.  Your prognosis if you have suffered a heart attack.  The effectiveness of cardiac procedures done, such as an angioplasty, which can increase the circulation in your coronary arteries.  Causes of chest pain or pressure. LET [facility] CARE PROVIDER  KNOW ABOUT:  Any allergies you have.  All medicines you are taking, including vitamins, herbs, eye drops, creams, and over-the-counter medicines.  Previous problems you or members of your family have had with the use of anesthetics.  Any blood disorders you have.  Previous surgeries you have had.  Medical conditions you have.  Possibility of pregnancy, if this applies.  If you are currently breastfeeding. RISKS AND COMPLICATIONS Generally, this is a safe procedure. However, as with any procedure, complications can occur. Possible complications include:  You develop pain or pressure in the following areas:  Chest.  Jaw or neck.  Between your shoulder blades.  Radiating down your left arm.  Headache.  Dizziness or light-headedness.  Shortness of breath.  Increased or irregular heartbeat.  Low blood pressure.  Nausea or vomiting.  Flushing.  Redness going up the arm and slight pain during injection of medicine.  Heart attack (rare). BEFORE THE PROCEDURE   Avoid all forms of caffeine for 24 hours before your test or as directed by your health care provider. This includes coffee, tea (even decaffeinated tea), caffeinated sodas, chocolate, cocoa, and certain pain medicines.  Follow your health care provider's instructions regarding eating and drinking before the test.  Take your medicines as directed at regular times with water unless instructed  otherwise. Exceptions may include:  If you have diabetes, ask how you are to take your insulin or pills. It is common to adjust insulin dosing the morning of the test.  If you are taking beta-blocker medicines, it is important to talk to your health care provider about these medicines well before the date of your test. Taking beta-blocker medicines may interfere with the test. In some cases, these medicines need to be changed or stopped 24 hours or more before the test.  If you wear a nitroglycerin patch, it may need to be  removed prior to the test. Ask your health care provider if the patch should be removed before the test.  If you use an inhaler for any breathing condition, bring it with you to the test.  If you are an outpatient, bring a snack so you can eat right after the stress phase of the test.  Do not smoke for 4 hours prior to the test or as directed by your health care provider.  Do not apply lotions, powders, creams, or oils on your chest prior to the test.  Wear comfortable shoes and clothing. Let your health care provider know if you were unable to complete or follow the preparations for your test. PROCEDURE   Multiple patches (electrodes) will be put on your chest. If needed, small areas of your chest may be shaved to get better contact with the electrodes. Once the electrodes are attached to your body, multiple wires will be attached to the electrodes, and your heart rate will be monitored.  An IV access will be started. A nuclear trace (isotope) is given. The isotope may be given intravenously, or it may be swallowed. Nuclear refers to several types of radioactive isotopes, and the nuclear isotope lights up the arteries so that the nuclear images are clear. The isotope is absorbed by your body. This results in low radiation exposure.  A resting nuclear image is taken to show how your heart functions at rest.  A medicine is given through the IV access.  A second scan is done about 1 hour after the medicine injection and determines how your heart functions under stress.  During this stress phase, you will be connected to an electrocardiogram machine. Your blood pressure and oxygen levels will be monitored. AFTER THE PROCEDURE   Your heart rate and blood pressure will be monitored after the test.  You may return to your normal schedule, including diet,activities, and medicines, unless your health care provider tells you otherwise.   This information is not intended to replace advice given  to you by your health care provider. Make sure you discuss any questions you have with your health care provider.   Document Released: 11/20/2008 Document Revised: 07/09/2013 Document Reviewed: 03/11/2013 Elsevier Interactive Patient Education [employer].

## 2016-04-12 ENCOUNTER — Encounter: Payer: Self-pay | Admitting: Physical Therapy

## 2016-04-12 ENCOUNTER — Ambulatory Visit: Payer: PPO | Attending: Internal Medicine | Admitting: Physical Therapy

## 2016-04-12 DIAGNOSIS — M6281 Muscle weakness (generalized): Secondary | ICD-10-CM | POA: Insufficient documentation

## 2016-04-12 DIAGNOSIS — M25562 Pain in left knee: Secondary | ICD-10-CM | POA: Diagnosis not present

## 2016-04-12 DIAGNOSIS — R262 Difficulty in walking, not elsewhere classified: Secondary | ICD-10-CM | POA: Diagnosis not present

## 2016-04-12 DIAGNOSIS — M25662 Stiffness of left knee, not elsewhere classified: Secondary | ICD-10-CM | POA: Insufficient documentation

## 2016-04-13 NOTE — Therapy (Signed)
[facility] [address], [place], [address] Phone: [phone]   Fax:  [phone]  Physical Therapy Evaluation  Patient Details  Name: [name] MRN: [id number] Date of Birth: September 02, 1940 Referring Provider: Mauri Pole MD  Encounter Date: 04/12/2016      PT End of Session - 04/12/16 1046    Visit Number 1   Number of Visits 8   Date for PT Re-Evaluation 05/10/16   Authorization Type 1   Authorization Time Period 10 (G code)   PT Start Time 1000   PT Stop Time 1045   PT Time Calculation (min) 45 min   Activity Tolerance Patient tolerated treatment well   Behavior During Therapy [facility] for tasks assessed/performed      Past Medical History:  Diagnosis Date  . Acid reflux   . Anemia    3 years ago  . Anxiety    in past  . Arthritis    all over  . Atypical moles   . Bronchitis   . Cancer ([place])    x2 stg II melanoma  . Complication of anesthesia    shoulder surgery- not all the way asleep  . Depression    in past  . Dizziness   . Fatigue   . Generalized headache    migraines  . GERD (gastroesophageal reflux disease)   . History of chemotherapy    topical cream for h/o skin CA  . Hypothyroidism   . Insomnia   . Irritable bowel syndrome   . LLQ abdominal pain   . Melanoma ([place]) 2013  . Neuropathy, peripheral ([place])   . Retina disorder    left  . Thyroid disease   . Weight gain     Past Surgical History:  Procedure Laterality Date  . CHOLECYSTECTOMY N/A 01/28/2015   Procedure: LAPAROSCOPIC CHOLECYSTECTOMY WITH INTRAOPERATIVE CHOLANGIOGRAM;  Surgeon: Autumn Messing III, MD;  Location: [facility];  Service: General;  Laterality: N/A;  . CLOSED MANIPULATION SHOULDER     rt  . COLONOSCOPY WITH PROPOFOL N/A 11/20/2012   Procedure: COLONOSCOPY WITH PROPOFOL;  Surgeon: Garlan Fair, MD;  [facility];  Service: Endoscopy;  Laterality: N/A;  . colonscopy     May 2014  . JOINT  REPLACEMENT     RTK  . KNEE ARTHROSCOPY    . left knee miniscus tear repair    . MELANOMA EXCISION WITH SENTINEL LYMPH NODE BIOPSY  03/18/11   Back, nodes neg  . ROTATOR CUFF REPAIR     left  . SHOULDER SURGERY     right  . TOTAL KNEE ARTHROPLASTY     right  . TUMOR REMOVAL     ovary    There were no vitals filed for this visit.       Subjective Assessment - 04/12/16 1015    Subjective patient reports feeling dizzy, light headed and having left knee pain, getting ready for TKA however is going to postpone surgery until she finds out why she is having increased light headedness.    Pertinent History Patient reports having increasing left knee pain over the past 6 months. She had TKA right knee 2010. She has had some reduction in pain in left knee with recent injection.    Limitations Standing;Walking;House hold activities   Patient Stated Goals to improve strength and prepare for TKA left knee    Currently in Pain? No/denies  [facility] PT Assessment - 04/12/16 2350      Assessment   Medical Diagnosis primary osteoarthritis left knee M17.12   Referring Provider Mauri Pole MD   Onset Date/Surgical Date 09/16/15   Hand Dominance Left   Next MD Visit unknown   Prior Therapy none     Precautions   Precautions None     Balance Screen   Has the patient fallen in the past 6 months No   Has the patient had a decrease in activity level because of a fear of falling?  No   Is the patient reluctant to leave their home because of a fear of falling?  No     Home [employer] residence   Living Arrangements Spouse/significant other   Type of [place] Access Level entry   Home Layout One level     Prior Function   Level of [place] Retired   Leisure exercise, walking, church, reading, outdoor activities     Cognition   Overall Cognitive Status Within Functional Limits for tasks assessed       Objective: Gait: ambulating independently without AD, decreased weight shift to left Posture: decreased weight bearing left AROM; right knee flexion 0-115 with stiffness end range Left knee flexion 0-115 with tightness end range flexion Strength; right LE grossly 4+/5 major muscle groups Left hip flexion 4/5, knee extension 4/5, flexion 4-/5, hip abduction 4-/5, ER 4-/5 Single leg standing: right LE 30 seconds, left LE 17 seconds Outcome measure: LEFS 24/80 (80 = no self perceived disability)  Treatment: Therapeutic exercise: patient performed exercises with guidance, verbal and tactile cues and demonstration of PT: Sitting:  Quad setting x 10 SLR x 10 Hip adduction with ball and glute sets x 15, Hip abduction with resistive band x 15 reps Sit to stand from elevated treatment table x 5 reps with VC for alignment and WB through left LE  Patient response to treatment: Demonstrated good understanding with good technique with exercises following demonstration and with VC        PT Education - 04/12/16 1045    Education provided Yes   Education Details HEP; ROM, quad sets/SLR, hip adduction, abduction with resistive band   Person(s) Educated Patient   Methods Explanation;Demonstration;Verbal cues;Handout   Comprehension Verbalized understanding;Returned demonstration;Verbal cues required             PT Long Term Goals - 04/12/16 1200      PT LONG TERM GOAL #1   Title Pt will be independent for HEP without cuing for ROM and strength and balance to prepare for TKA by 05/10/2016   Baseline no knowledge of appropriate exercises to prepare for TKA   Status New     PT LONG TERM GOAL #2   Title Imrpove LEFS by 9 points to demonstrate improved function with LE for daily tasks by 05/10/2016   Baseline LEFS = 24/80   Status New               Plan - 04/12/16 1046    Clinical Impression Statement Patient is a 75 year old female who presents with left knee pain and LE  weakness and would like to prepare for TKA. She has LEFS score of  24/80 and limited AROM left knee flexion 115 degrees. She has no knowledge of appropriate exercises to prepare for TKA and will benefit from physical therapy intervention.    Rehab Potential Good  Clinical Impairments Affecting Rehab Potential (+) motivated, active (-) arthritis, age   PT Frequency 2x / week   PT Duration 4 weeks   PT Treatment/Interventions Electrical Stimulation;Patient/family education;Cryotherapy;Moist Heat;Neuromuscular re-education;Therapeutic exercise;Manual techniques   PT Next Visit Plan modalities as neede for pain, muscle re education, therapeutic exercise   PT Home Exercise Plan exercises for ROM/strength   Consulted and Agree with Plan of Care Patient      Patient will benefit from skilled therapeutic intervention in order to improve the following deficits and impairments:  Decreased strength, Decreased activity tolerance, Impaired perceived functional ability, Pain, Decreased endurance, Decreased range of motion, Difficulty walking  Visit Diagnosis: Pain in left knee - Plan: PT plan of care cert/re-cert  Difficulty in walking, not elsewhere classified - Plan: PT plan of care cert/re-cert  Muscle weakness (generalized) - Plan: PT plan of care cert/re-cert  Stiffness of left knee, not elsewhere classified - Plan: PT plan of care cert/re-cert      G-Codes - [id number]    Functional Assessment Tool Used LEFS, pain scale, ROM, strength, clinical judgment   Functional Limitation Mobility: Walking and moving around   Mobility: Walking and Moving Around Current Status [id number]) At least 40 percent but less than 60 percent impaired, limited or restricted   Mobility: Walking and Moving Around Goal Status [phone]) At least 20 percent but less than 40 percent impaired, limited or restricted       Problem List Patient Active Problem List   Diagnosis Date Noted  . MCI (mild cognitive impairment)  12/17/2015  . Urinary frequency 08/09/2015  . Hypothyroidism 08/09/2015  . Syncope 08/08/2015  . Posterior vitreous detachment 07/23/2015  . Hereditary and idiopathic peripheral neuropathy 05/11/2015  . Acute pancreatitis 12/21/2014  . Gallstones 12/21/2014  . Abdominal pain 12/21/2014  . Acute bronchitis due to infection 07/04/2014  . Cellophane retinopathy 04/12/2012  . Cataract, nuclear 04/12/2012  . Left lower quadrant pain 08/16/2011  . Breast pain in female 07/18/2011  . Melanoma of back ([place]) 02/28/2011    Jomarie Longs PT 04/13/2016, 6:04 PM  [facility] [address], [place], [address] Phone: [phone]   Fax:  [phone]  Name: [name] MRN: [id number] Date of Birth: May 26, 1941

## 2016-04-17 DIAGNOSIS — I6523 Occlusion and stenosis of bilateral carotid arteries: Secondary | ICD-10-CM

## 2016-04-17 HISTORY — DX: Occlusion and stenosis of bilateral carotid arteries: I65.23

## 2016-04-18 ENCOUNTER — Encounter: Payer: Self-pay | Admitting: Physical Therapy

## 2016-04-18 ENCOUNTER — Ambulatory Visit: Payer: PPO | Attending: Internal Medicine | Admitting: Physical Therapy

## 2016-04-18 DIAGNOSIS — J309 Allergic rhinitis, unspecified: Secondary | ICD-10-CM | POA: Diagnosis not present

## 2016-04-18 DIAGNOSIS — M6281 Muscle weakness (generalized): Secondary | ICD-10-CM | POA: Insufficient documentation

## 2016-04-18 DIAGNOSIS — F329 Major depressive disorder, single episode, unspecified: Secondary | ICD-10-CM | POA: Diagnosis not present

## 2016-04-18 DIAGNOSIS — M25562 Pain in left knee: Secondary | ICD-10-CM | POA: Insufficient documentation

## 2016-04-18 DIAGNOSIS — E039 Hypothyroidism, unspecified: Secondary | ICD-10-CM | POA: Diagnosis not present

## 2016-04-18 DIAGNOSIS — R262 Difficulty in walking, not elsewhere classified: Secondary | ICD-10-CM | POA: Diagnosis not present

## 2016-04-18 DIAGNOSIS — R42 Dizziness and giddiness: Secondary | ICD-10-CM | POA: Diagnosis not present

## 2016-04-18 DIAGNOSIS — Z0000 Encounter for general adult medical examination without abnormal findings: Secondary | ICD-10-CM | POA: Diagnosis not present

## 2016-04-18 DIAGNOSIS — Z1231 Encounter for screening mammogram for malignant neoplasm of breast: Secondary | ICD-10-CM | POA: Diagnosis not present

## 2016-04-18 DIAGNOSIS — G8929 Other chronic pain: Secondary | ICD-10-CM | POA: Diagnosis not present

## 2016-04-18 DIAGNOSIS — Z23 Encounter for immunization: Secondary | ICD-10-CM | POA: Diagnosis not present

## 2016-04-18 DIAGNOSIS — E785 Hyperlipidemia, unspecified: Secondary | ICD-10-CM | POA: Diagnosis not present

## 2016-04-18 DIAGNOSIS — F411 Generalized anxiety disorder: Secondary | ICD-10-CM | POA: Diagnosis not present

## 2016-04-18 DIAGNOSIS — K219 Gastro-esophageal reflux disease without esophagitis: Secondary | ICD-10-CM | POA: Diagnosis not present

## 2016-04-18 DIAGNOSIS — E559 Vitamin D deficiency, unspecified: Secondary | ICD-10-CM | POA: Diagnosis not present

## 2016-04-18 NOTE — Therapy (Signed)
[facility] [address] [place], [address] Phone: [phone]   Fax:  [phone]  Physical Therapy Treatment  Patient Details  Name: [name] MRN: [id number] Date of Birth: 05-07-1941 Referring Provider: Mauri Pole MD  Encounter Date: 04/18/2016      PT End of Session - 04/18/16 0852    Visit Number 2   Number of Visits 8   Date for PT Re-Evaluation 05/10/16   Authorization Type 2   Authorization Time Period 10 (G code)   PT Start Time [phone]   PT Stop Time 0933   PT Time Calculation (min) 46 min   Activity Tolerance Patient tolerated treatment well   Behavior During Therapy [facility] for tasks assessed/performed      Past Medical History:  Diagnosis Date  . Acid reflux   . Anemia    3 years ago  . Anxiety    in past  . Arthritis    all over  . Atypical moles   . Bronchitis   . Cancer ([place])    x2 stg II melanoma  . Complication of anesthesia    shoulder surgery- not all the way asleep  . Depression    in past  . Dizziness   . Fatigue   . Generalized headache    migraines  . GERD (gastroesophageal reflux disease)   . History of chemotherapy    topical cream for h/o skin CA  . Hypothyroidism   . Insomnia   . Irritable bowel syndrome   . LLQ abdominal pain   . Melanoma ([place]) 2013  . Neuropathy, peripheral ([place])   . Retina disorder    left  . Thyroid disease   . Weight gain     Past Surgical History:  Procedure Laterality Date  . CHOLECYSTECTOMY N/A 01/28/2015   Procedure: LAPAROSCOPIC CHOLECYSTECTOMY WITH INTRAOPERATIVE CHOLANGIOGRAM;  Surgeon: Autumn Messing III, MD;  Location: [facility];  Service: General;  Laterality: N/A;  . CLOSED MANIPULATION SHOULDER     rt  . COLONOSCOPY WITH PROPOFOL N/A 11/20/2012   Procedure: COLONOSCOPY WITH PROPOFOL;  Surgeon: Garlan Fair, MD;  [facility];  Service: Endoscopy;  Laterality: N/A;  . colonscopy     May 2014  . JOINT  REPLACEMENT     RTK  . KNEE ARTHROSCOPY    . left knee miniscus tear repair    . MELANOMA EXCISION WITH SENTINEL LYMPH NODE BIOPSY  03/18/11   Back, nodes neg  . ROTATOR CUFF REPAIR     left  . SHOULDER SURGERY     right  . TOTAL KNEE ARTHROPLASTY     right  . TUMOR REMOVAL     ovary    There were no vitals filed for this visit.      Subjective Assessment - 04/18/16 0849    Subjective Patient reports she is exercising for left knee as instructed. She is postponing TKA until the new year.   Limitations Standing;Walking;House hold activities   Patient Stated Goals to improve strength and prepare for TKA left knee    Currently in Pain? No/denies     Objective: Gait: ambulating independently without AD with normal gait pattern   Treatment: Therapeutic exercise: patient performed exercises with guidance, verbal and tactile cues and demonstration of PT: Sitting:  Hip adduction with ball and glute sets x 15, Hip abduction with resistive band x 15 reps Knee extension with 2# weights x 15 reps Knee extension  with red resistive band x 25 reps Supine lying: SLR with 2# above knee  x 10 Side lying clamshells x 10 right and left Prone lying hip extension x 10 each with VC to perform with correct hip/LE alignment Standing:  With resistive band around thighs: side stepping along airex balance beam at counter x 2 min. Standing hip abduction and extension 3 sets x 3 reps with demonstration and VC Walk along balance stones x (4) 5 sets    Patient response to treatment: Patient demonstrated improved technique with exercises with verbal and tactile cues. Patient demonstrated weakness and decreased motor control with hip extension prone lying and with clamshells side lying         PT Education - 04/18/16 0851    Education provided Yes   Education Details HEP: re assessed exercises given and added standing hip abduction, extension and side stepping with resistive band, prone hip  extension   Person(s) Educated Patient   Methods Explanation;Demonstration;Verbal cues   Comprehension Verbalized understanding;Returned demonstration;Verbal cues required             PT Long Term Goals - 04/12/16 1200      PT LONG TERM GOAL #1   Title Pt will be independent for HEP without cuing for ROM and strength and balance to prepare for TKA by 05/10/2016   Baseline no knowledge of appropriate exercises to prepare for TKA   Status New     PT LONG TERM GOAL #2   Title Imrpove LEFS by 9 points to demonstrate improved function with LE for daily tasks by 05/10/2016   Baseline LEFS = 24/80   Status New               Plan - 04/18/16 [id number]    Clinical Impression Statement Patient is progressing well with exercises. She demonstrated decreased strength and motor control with exercises and improved with assistance, VC. She will benefit from additional physical therapy intervention to further strengthen and improve motor control in preparation for TKA.    Rehab Potential Good   PT Frequency 2x / week   PT Duration 4 weeks   PT Treatment/Interventions Electrical Stimulation;Patient/family education;Cryotherapy;Moist Heat;Neuromuscular re-education;Therapeutic exercise;Manual techniques   PT Next Visit Plan modalities as neede for pain, muscle re education, therapeutic exercise; add balance with one foot on balance stone   PT Home Exercise Plan exercises for ROM/strength      Patient will benefit from skilled therapeutic intervention in order to improve the following deficits and impairments:  Decreased strength, Decreased activity tolerance, Impaired perceived functional ability, Pain, Decreased endurance, Decreased range of motion, Difficulty walking  Visit Diagnosis: Chronic pain of left knee  Difficulty in walking, not elsewhere classified  Muscle weakness (generalized)     Problem List Patient Active Problem List   Diagnosis Date Noted  . MCI (mild cognitive  impairment) 12/17/2015  . Urinary frequency 08/09/2015  . Hypothyroidism 08/09/2015  . Syncope 08/08/2015  . Posterior vitreous detachment 07/23/2015  . Hereditary and idiopathic peripheral neuropathy 05/11/2015  . Acute pancreatitis 12/21/2014  . Gallstones 12/21/2014  . Abdominal pain 12/21/2014  . Acute bronchitis due to infection 07/04/2014  . Cellophane retinopathy 04/12/2012  . Cataract, nuclear 04/12/2012  . Left lower quadrant pain 08/16/2011  . Breast pain in female 07/18/2011  . Melanoma of back [facility]) 02/28/2011    Jomarie Longs PT 04/18/2016, 9:38 AM  [facility] [address], [place], [address] Phone: [phone]  Fax:  [phone]  Name: NEIDRA BROWER MRN: [id number] Date of Birth: 1940/10/31

## 2016-04-20 ENCOUNTER — Ambulatory Visit: Payer: PPO | Admitting: Physical Therapy

## 2016-04-20 ENCOUNTER — Encounter: Payer: Self-pay | Admitting: Physical Therapy

## 2016-04-20 DIAGNOSIS — M25562 Pain in left knee: Principal | ICD-10-CM

## 2016-04-20 DIAGNOSIS — G8929 Other chronic pain: Secondary | ICD-10-CM

## 2016-04-20 DIAGNOSIS — M6281 Muscle weakness (generalized): Secondary | ICD-10-CM

## 2016-04-20 NOTE — Therapy (Signed)
[facility] [address], [place], [address] Phone: [phone]   Fax:  [phone]  Physical Therapy Treatment/Discharge Summary  Patient Details  Name: [name] MRN: [id number] Date of Birth: 09-07-1940 Referring Provider: Mauri Pole MD  Encounter Date: 04/20/2016   Patient began physical therapy 04/12/2016, has attended 3 sessions through 04/20/2016 and has achieved independence with home program in preparation for future TKA. No further PT recommended at this time.       PT End of Session - 04/20/16 1024    Visit Number 3   Number of Visits 8   Date for PT Re-Evaluation 05/10/16   Authorization Type 3   Authorization Time Period 10 (G code)   PT Start Time 0933   PT Stop Time 1017   PT Time Calculation (min) 44 min   Activity Tolerance Patient tolerated treatment well   Behavior During Therapy [facility] for tasks assessed/performed      Past Medical History:  Diagnosis Date  . Acid reflux   . Anemia    3 years ago  . Anxiety    in past  . Arthritis    all over  . Atypical moles   . Bronchitis   . Cancer ([place])    x2 stg II melanoma  . Complication of anesthesia    shoulder surgery- not all the way asleep  . Depression    in past  . Dizziness   . Fatigue   . Generalized headache    migraines  . GERD (gastroesophageal reflux disease)   . History of chemotherapy    topical cream for h/o skin CA  . Hypothyroidism   . Insomnia   . Irritable bowel syndrome   . LLQ abdominal pain   . Melanoma ([place]) 2013  . Neuropathy, peripheral ([place])   . Retina disorder    left  . Thyroid disease   . Weight gain     Past Surgical History:  Procedure Laterality Date  . CHOLECYSTECTOMY N/A 01/28/2015   Procedure: LAPAROSCOPIC CHOLECYSTECTOMY WITH INTRAOPERATIVE CHOLANGIOGRAM;  Surgeon: Autumn Messing III, MD;  Location: [facility];  Service: General;  Laterality: N/A;  . CLOSED MANIPULATION SHOULDER      rt  . COLONOSCOPY WITH PROPOFOL N/A 11/20/2012   Procedure: COLONOSCOPY WITH PROPOFOL;  Surgeon: Garlan Fair, MD;  [facility];  Service: Endoscopy;  Laterality: N/A;  . colonscopy     May 2014  . JOINT REPLACEMENT     RTK  . KNEE ARTHROSCOPY    . left knee miniscus tear repair    . MELANOMA EXCISION WITH SENTINEL LYMPH NODE BIOPSY  03/18/11   Back, nodes neg  . ROTATOR CUFF REPAIR     left  . SHOULDER SURGERY     right  . TOTAL KNEE ARTHROPLASTY     right  . TUMOR REMOVAL     ovary    There were no vitals filed for this visit.      Subjective Assessment - 04/20/16 0940    Subjective Patient reports she is exercising as instructed for knee. She is confident with exercises to continue to strengthen and maintain flexibility in preparation for future TKA surgery. She agrees to discharge from PT at this time. She is currently addressing other medical issues and will contact MD regarding surgery when able.    Limitations Standing;Walking;House hold activities   Patient Stated Goals to improve strength and prepare for TKA left knee  Currently in Pain? No/denies         Objective: Gait: ambulating independently without AD with normal gait pattern AROM: left knee flexion 0-115; right knee flexion 0-115 Strength; grossly left knee extension/flexion 4/5 and equal to right, hip abduction, extension and ER 4-/5 and equal to right LE   Treatment: Therapeutic exercise: patient performed exercises with guidance, verbal and tactile cues and demonstration of PT: Sitting:  Hip adduction with ball and glute sets x 15, Hip abduction with resistive band x 15 reps SLR with hold x 10 seconds and then 10 reps through ROM, 3 sets Knee flexion with red resistive band x 25 reps Supine lying: SLR with 2# above knee  x 10 Side lying clamshells x 25 right and left Prone lying hip extension x 10 each with minimal VC to perform with correct hip/LE alignment Standing:  With  resistive band around thighs: side stepping along airex balance beam at counter x 2 min. Standing hip abduction and extension 5 sets x 3 reps with demonstration and VC Walk along balance stones x (4) 5 sets    Patient response to treatment: Patient demonstrates good understanding of correct technique, posture and position/alignment of hip/knee for all exercises with minimal to no cuing. Improved clamshell, hip extension exercises with repetition            PT Education - 04/20/16 1022    Education provided Yes   Education Details HEP: re assessed/reviewed exercises to continue for flexibility/strength to prepare for future TKA   Person(s) Educated Patient   Methods Explanation;Demonstration;Verbal cues   Comprehension Verbalized understanding;Returned demonstration;Verbal cues required             PT Long Term Goals - 04/20/16 1027      PT LONG TERM GOAL #1   Title Pt will be independent for HEP without cuing for ROM and strength and balance to prepare for TKA by 05/10/2016   Baseline no knowledge of appropriate exercises to prepare for TKA; current 04/20/16 achieved independence with HEP with handout given   Status Achieved     PT LONG TERM GOAL #2   Title Imrpove LEFS by 9 points to demonstrate improved function with LE for daily tasks by 05/10/2016   Baseline  status LEFS = 24/80 not formally assessed, progressing at time of discharge Deferred due to not attending for significant amount of time to accurately re assess               Plan - 04/20/16 1024    Clinical Impression Statement Patient is independent with home program of exercise and will be able to continue on own to prepare for surgery in the future. Goals achieved for independent home program, others partially achieved due to limited number of sessions attended.  She should continue to improve strength and endurance with continued self managment.    Rehab Potential Good   PT Frequency 2x / week    PT Duration 4 weeks   PT Treatment/Interventions Electrical Stimulation;Patient/family education;Cryotherapy;Moist Heat;Neuromuscular re-education;Therapeutic exercise;Manual techniques   PT Next Visit Plan discharge from physical therapy with goals achieved   PT Home Exercise Plan exercises for ROM/strength      Patient will benefit from skilled therapeutic intervention in order to improve the following deficits and impairments:  Decreased strength, Decreased activity tolerance, Impaired perceived functional ability, Pain, Decreased endurance, Decreased range of motion, Difficulty walking  Visit Diagnosis: Chronic pain of left knee  Muscle weakness (generalized)  G-Codes - 04/20/16 1029    Functional Assessment Tool Used LEFS, pain scale, ROM, strength, clinical judgment   Functional Limitation Mobility: Walking and moving around   Mobility: Walking and Moving Around Goal Status [phone]) At least 20 percent but less than 40 percent impaired, limited or restricted   Mobility: Walking and Moving Around Discharge Status [phone]) At least 20 percent but less than 40 percent impaired, limited or restricted      Problem List Patient Active Problem List   Diagnosis Date Noted  . MCI (mild cognitive impairment) 12/17/2015  . Urinary frequency 08/09/2015  . Hypothyroidism 08/09/2015  . Syncope 08/08/2015  . Posterior vitreous detachment 07/23/2015  . Hereditary and idiopathic peripheral neuropathy 05/11/2015  . Acute pancreatitis 12/21/2014  . Gallstones 12/21/2014  . Abdominal pain 12/21/2014  . Acute bronchitis due to infection 07/04/2014  . Cellophane retinopathy 04/12/2012  . Cataract, nuclear 04/12/2012  . Left lower quadrant pain 08/16/2011  . Breast pain in female 07/18/2011  . Melanoma of back [facility]) 02/28/2011    Jomarie Longs PT 04/21/2016, 12:34 PM  [place] [address] [place],  [address] Phone: [phone]   Fax:  [phone]  Name: JAYLEE BOTTARI MRN: [id number] Date of Birth: [birth year]

## 2016-04-22 ENCOUNTER — Other Ambulatory Visit: Payer: Self-pay

## 2016-04-22 DIAGNOSIS — H539 Unspecified visual disturbance: Secondary | ICD-10-CM | POA: Diagnosis not present

## 2016-04-22 DIAGNOSIS — J309 Allergic rhinitis, unspecified: Secondary | ICD-10-CM | POA: Diagnosis not present

## 2016-04-22 DIAGNOSIS — F411 Generalized anxiety disorder: Secondary | ICD-10-CM | POA: Diagnosis not present

## 2016-04-25 ENCOUNTER — Encounter: Payer: PPO | Admitting: Physical Therapy

## 2016-04-26 NOTE — Progress Notes (Signed)
Follow-up Visit   Date: 04/27/16    [name] MRN: [id number] DOB: [birth year]   Interim History: [name] is a 75 y.o. left-handed Caucasian female with GERD, hypothyroidism, stage IA malignant melanoma of the back (2012) s/p excision, idiopathic neuropathy, and GERD returning to the clinic with new complaints visual auras.  The patient was accompanied to the clinic by self.  History of present illness: She reports having neuropathy since 1990s and was seeing Dr. Erling Cruz at [facility] since 2008. Symptoms started in her right foot which gradually involved left foot and over the past 2-3 years also involves her fingers bilaterally. Symptoms are described as numbness, burning, and stinging sensation. Numbness is at the level of her mid-calf now, which is constant. She was taking gabapentin 100mg  at bedtime which helped her sleep but did not provide any relief. She stopped taking this over a year ago. She was briefly taking Lyrica, but did not continue due to side effects. Previously evaluation has included normal serum protein electrophoresis, borderline high TSH, normal RA, ACE , ANA, sedimentation rate, and vitamin B12. NCS/EMG was also done, but results are unknown.   No history of diabetes, alcohol use, or chemotherapy. Her sister also has neuropathy.  She denies any problems with her memory and able to take care of own finances and driving without any safety concerns. She has a strong family history of Alzheimer's disease.  UPDATE 09/07/2015:   She reports having a congestion for three weeks in December and then developed imbalance.  She did not have vertigo.  She was seeing ENT for these symptoms who diagnosed her with vestibular neuritis. She began having spells of feeling lightheaded in January and presented to the Emergency Department after experiencing four presyncopal events.  Spells are triggered by head movement. MRI of brain not reveal intracranial mass lesion, but  there was an indeterminate 3 mm lesion at the tip of the dens and recommended a short interval follow-up study in 3 months given history of melanoma. She is here to discuss this.  UPDATE 12/17/2015:  Repeat MRI brain shows stable lesion of the dens, most like due to arthritis. She complains of numbness in the fingers and cramps of the hands.  Her neuropathy has been worse the past few days, describes it a 1000 bee stings. She is currently not taking any medications.  She previously tried gabapentin but did not notice a significant changes. She endorses having short-term memory problems with names and appointments. She lives alone and does all her IADLs and ADLs.  She drives and has not been involved in any MVAs.  She denies getting lose.    UPDATE 04/27/2016:  She called for sooner appointment due to increased frequency of visual auras.  She has a long history of migraine, occurring once every month preceded by visual aura, but recently she began having visual aura occurring twice daily for the past 6 weeks, but it never transitioned into a painful migraine.  The visual symptoms involve both eyes and last about 20 minutes.  She is unable to take NSAIDs due to GI irritation, but fortunately, had not needed to take anything for pain, because symptoms are only visual.  The aura can be bothersome if they occur when she is driving or at a meeting and she has had to pull to the side of the road.   She saw her eye doctor who suggested it may also be related to retinopathy, but she only has this in  one eye.   Medications:  Current Outpatient Prescriptions on File Prior to Visit  Medication Sig Dispense Refill  . albuterol (PROVENTIL HFA;VENTOLIN HFA) 108 (90 BASE) MCG/ACT inhaler Inhale 1-2 puffs into the lungs every 6 (six) hours as needed for wheezing or shortness of breath.    . Ascorbic Acid (VITAMIN C) 1000 MG tablet Take 1,000 mg by mouth daily.    Marland Kitchen aspirin EC 81 MG tablet Take 81 mg by mouth daily.    .  Biotin 5000 MCG CAPS Take 5,000 mcg by mouth daily.    Marland Kitchen CALCIUM PO Take 500 mg by mouth daily.    . Cholecalciferol (VITAMIN D3) 2000 UNITS capsule Take 2,000 Units by mouth daily.    . DOCOSAHEXAENOIC ACID PO Take by mouth.    Marland Kitchen ibuprofen (ADVIL,MOTRIN) 200 MG tablet Take by mouth.    . levothyroxine (SYNTHROID) 50 MCG tablet Take 1 tablet (50 mcg total) by mouth daily before breakfast. 30 tablet 1  . Multiple Vitamin (MULTIVITAMIN WITH MINERALS) TABS tablet Take 1 tablet by mouth daily.    . Multiple Vitamins-Minerals (ICAPS MV) TABS Take 1 tablet by mouth daily.    . Omega-3 Fatty Acids (FISH OIL) 1200 MG CAPS Take 1,200 mg by mouth 3 (three) times daily.    . vitamin B-12 (CYANOCOBALAMIN) 1000 MCG tablet Take 1,000 mcg by mouth daily.     No current facility-administered medications on file prior to visit.     Allergies:  Allergies  Allergen Reactions  . Epinephrine Other (See Comments)    Heart palpitations  . Penicillins Swelling    Lip swelling Has patient had a PCN reaction causing immediate rash, facial/tongue/throat swelling, SOB or lightheadedness with hypotension: Yes Has patient had a PCN reaction causing severe rash involving mucus membranes or skin necrosis: No Has patient had a PCN reaction that required hospitalization No Has patient had a PCN reaction occurring within the last 10 years: No If all of the above answers are "NO", then may proceed with Cephalosporin use.  . Valium [Diazepam] Other (See Comments)    sedation    Review of Systems:  CONSTITUTIONAL: No fevers, chills, night sweats, or weight loss.  EYES: +visual changes or eye pain ENT: No hearing changes.  No history of nose bleeds.   RESPIRATORY: No cough, wheezing and shortness of breath.   CARDIOVASCULAR: Negative for chest pain, and palpitations.   GI: Negative for abdominal discomfort, blood in stools or black stools.  No recent change in bowel habits.   GU:  No history of incontinence.     MUSCLOSKELETAL: No history of joint pain or swelling.  No myalgias.   SKIN: Negative for lesions, rash, and itching.   ENDOCRINE: Negative for cold or heat intolerance, polydipsia or goiter.   PSYCH:  No depression or anxiety symptoms.   NEURO: As Above.   Vital Signs:  BP 136/78   Pulse 70   Temp 98.2 F (36.8 C) (Oral)   Ht 5' 6.5" (1.689 m)   Wt 182 lb 6 oz (82.7 kg)   SpO2 97%   BMI 29.00 kg/m   Neurological Exam: MENTAL STATUS including orientation to time, place, person, recent and remote memory, attention span and concentration, language, and fund of knowledge is normal.  Speech is not dysarthric.  Montreal Cognitive Assessment  12/17/2015 12/17/2015  Visuospatial/ Executive (0/5) 4 4  Naming (0/3) 3 3  Attention: Read list of digits (0/2) 2 2  Attention: Read list of letters (0/1) 1  1  Attention: Serial 7 subtraction starting at 100 (0/3) 3 3  Language: Repeat phrase (0/2) 2 2  Language : Fluency (0/1) 1 1  Abstraction (0/2) 2 2  Delayed Recall (0/5) 3 3  Orientation (0/6) 6 6  Total 27 27  Adjusted Score (based on education) - 27    CRANIAL NERVES: No visual field defects.  Pupils equal round and reactive to light.  Normal conjugate, extra-ocular eye movements in all directions of gaze.  No ptosis. Normal facial sensation.  Face is symmetric. Palate elevates symmetrically.  Tongue is midline.  MOTOR:  Motor strength is 5/5 in all extremities  No pronator drift.  Tone is normal.    MSRs:  Reflexes are 2+/4 throughout, except 1+ at the Achilles bilaterally.  SENSORY:  Vibration diminished distal to knees bilaterally.  COORDINATION/GAIT:  Gait is unsteady at times, unassisted.  Data: Peripheral vascular studies 02/13/2015: Normal ABI  MRI brain and cervical spine 11/10/2014: No acute infarct. Remote right frontal lobe infarct with encephalomalacia. Prominent small vessel disease type changes representing significant progression since prior exam. No intracranial  mass lesion noted on this unenhanced exam. Cervical spondylotic changes upper cervical spine most prominent C4-5 level.  MRI brain 08/07/2015: 1. No intracranial mass lesion or abnormal enhancement. 2. Previously noted indeterminate 3 mm lesion at the tip of the dens demonstrates homogeneous postcontrast enhancement. Given the location and morphology of this lesion, this is favored to be degenerative in in nature. However, given the history of melanoma, a short interval follow-up study in 3 months is recommended to document stability to ensure no underlying metastatic lesion is present.  MRI brain wwo contrast 10/19/2015: Negative for metastatic disease to the brain. No acute infarct. Moderate to advanced chronic microvascular ischemia. Large joint effusion of the right TMJ. This is unchanged and may be related to degenerative change or rheumatoid arthritis 3 mm enhancing lesion the dens is stable and most likely related to arthritis. Question rheumatoid arthritis Negative for metastatic disease to the brain.  NCS/EMG of the upper extremities 01/05/2016: 1. Chronic C6, C7, and C8 radiculopathy affecting right upper extremity, mild in degree electrically. 2. Chronic C8 radiculopathy affecting the left upper extremity, mild in degree electrically. 3. Incidentally, there is a Martin-Gruber anastomosis bilaterally, a normal variant.  Lab Results  Component Value Date   [id number] 05/11/2015   Lab Results  Component Value Date   TSH 6.663 (H) 08/09/2015   Lab Results  Component Value Date   ESRSEDRATE 7 05/11/2015    IMPRESSION/PLAN: 1.  Ocular migraine   - Start magnesium oxide 400-600mg  daily daily as a preventative agent  - MRI brain imaging shows small enhancement of the dens, which is stable and most likely reflects arthritis.  Rheumatoid factor is negative  2.  Bilateral hand paresthesias due to multilevel cervical radiculopathy as noted on EMG.  No evidence of CTS  - Neck PT  declined  3.  Mild cognitive impairment - stable.  She is highly independent but having some difficulty with episodic memory.    - Neuropsychological evaluation declined  4.  Idiopathic peripheral neuropathy, diagnosed 1990s, manifesting predominately with paresthesias following a glove-stocking distribution - stable  Return to clinic in 6 months  The duration of this appointment visit was 25 minutes of face-to-face time with the patient.  Greater than 50% of this time was spent in counseling, explanation of diagnosis, planning of further management, and coordination of care.   Thank you for allowing me to  participate in patient's care.  If I can answer any additional questions, I would be pleased to do so.    Sincerely,    Deannie Resetar K. Posey Pronto, DO

## 2016-04-27 ENCOUNTER — Encounter: Payer: Self-pay | Admitting: Neurology

## 2016-04-27 ENCOUNTER — Ambulatory Visit (INDEPENDENT_AMBULATORY_CARE_PROVIDER_SITE_OTHER): Payer: PPO | Admitting: Neurology

## 2016-04-27 ENCOUNTER — Encounter: Payer: PPO | Admitting: Physical Therapy

## 2016-04-27 VITALS — BP 136/78 | HR 70 | Temp 98.2°F | Ht 66.5 in | Wt 182.4 lb

## 2016-04-27 DIAGNOSIS — M5412 Radiculopathy, cervical region: Secondary | ICD-10-CM | POA: Diagnosis not present

## 2016-04-27 DIAGNOSIS — G3184 Mild cognitive impairment, so stated: Secondary | ICD-10-CM | POA: Diagnosis not present

## 2016-04-27 DIAGNOSIS — G43109 Migraine with aura, not intractable, without status migrainosus: Secondary | ICD-10-CM

## 2016-04-27 DIAGNOSIS — G609 Hereditary and idiopathic neuropathy, unspecified: Secondary | ICD-10-CM

## 2016-04-27 NOTE — Patient Instructions (Addendum)
Start magnesium oxide 400-600mg  daily  Return to clinic in 6 months

## 2016-04-29 ENCOUNTER — Telehealth: Payer: Self-pay | Admitting: Cardiology

## 2016-04-29 NOTE — Telephone Encounter (Signed)
Reviewed myoview instructions w/pt who verbalized understanding.  

## 2016-05-02 ENCOUNTER — Encounter
Admission: RE | Admit: 2016-05-02 | Discharge: 2016-05-02 | Disposition: A | Discharge status: Home / Self Care | Payer: PPO | Source: Ambulatory Visit | Attending: Cardiology | Admitting: Cardiology

## 2016-05-02 DIAGNOSIS — R0602 Shortness of breath: Secondary | ICD-10-CM | POA: Diagnosis not present

## 2016-05-02 DIAGNOSIS — R0789 Other chest pain: Secondary | ICD-10-CM

## 2016-05-02 DIAGNOSIS — R079 Chest pain, unspecified: Secondary | ICD-10-CM | POA: Diagnosis not present

## 2016-05-02 LAB — NM MYOCAR MULTI W/SPECT W/WALL MOTION / EF
Estimated workload: 1 METS
Exercise duration (min): 0 min
Exercise duration (sec): 0 s
LV dias vol: 47 mL (ref 46–106)
LV sys vol: 18 mL
MPHR: 145 {beats}/min
Peak HR: 80 {beats}/min
Percent HR: 55 %
Rest HR: 56 {beats}/min
SDS: 5
SRS: 0
SSS: 5
TID: 0.93

## 2016-05-02 MED ORDER — TECHNETIUM TC 99M TETROFOSMIN IV KIT
32.5230 | PACK | Freq: Once | INTRAVENOUS | Status: AC | PRN
  Administered 2016-05-02: 32.523 via INTRAVENOUS

## 2016-05-02 MED ORDER — TECHNETIUM TC 99M TETROFOSMIN IV KIT
13.0000 | PACK | Freq: Once | INTRAVENOUS | Status: AC | PRN
  Administered 2016-05-02: 12.8 via INTRAVENOUS

## 2016-05-02 MED ORDER — REGADENOSON 0.4 MG/5ML IV SOLN
0.4000 mg | Freq: Once | INTRAVENOUS | Status: AC
  Administered 2016-05-02: 0.4 mg via INTRAVENOUS

## 2016-05-03 ENCOUNTER — Other Ambulatory Visit: Payer: PPO

## 2016-05-10 DIAGNOSIS — Z1211 Encounter for screening for malignant neoplasm of colon: Secondary | ICD-10-CM | POA: Diagnosis not present

## 2016-05-11 ENCOUNTER — Ambulatory Visit (INDEPENDENT_AMBULATORY_CARE_PROVIDER_SITE_OTHER): Payer: PPO

## 2016-05-11 ENCOUNTER — Other Ambulatory Visit: Payer: Self-pay

## 2016-05-11 DIAGNOSIS — R079 Chest pain, unspecified: Secondary | ICD-10-CM | POA: Diagnosis not present

## 2016-05-11 DIAGNOSIS — R0602 Shortness of breath: Secondary | ICD-10-CM

## 2016-05-12 ENCOUNTER — Ambulatory Visit
Admission: RE | Admit: 2016-05-12 | Discharge: 2016-05-12 | Disposition: A | Discharge status: Home / Self Care | Payer: PPO | Source: Ambulatory Visit

## 2016-05-12 ENCOUNTER — Ambulatory Visit: Payer: Self-pay | Admitting: Neurology

## 2016-05-12 DIAGNOSIS — I6523 Occlusion and stenosis of bilateral carotid arteries: Secondary | ICD-10-CM | POA: Diagnosis not present

## 2016-05-12 DIAGNOSIS — H35372 Puckering of macula, left eye: Secondary | ICD-10-CM | POA: Diagnosis not present

## 2016-05-12 DIAGNOSIS — H539 Unspecified visual disturbance: Secondary | ICD-10-CM

## 2016-05-18 DIAGNOSIS — I2584 Coronary atherosclerosis due to calcified coronary lesion: Secondary | ICD-10-CM | POA: Diagnosis not present

## 2016-05-18 DIAGNOSIS — Z780 Asymptomatic menopausal state: Secondary | ICD-10-CM | POA: Diagnosis not present

## 2016-05-18 DIAGNOSIS — I6523 Occlusion and stenosis of bilateral carotid arteries: Secondary | ICD-10-CM | POA: Diagnosis not present

## 2016-05-18 DIAGNOSIS — Z23 Encounter for immunization: Secondary | ICD-10-CM | POA: Diagnosis not present

## 2016-05-18 DIAGNOSIS — K219 Gastro-esophageal reflux disease without esophagitis: Secondary | ICD-10-CM | POA: Diagnosis not present

## 2016-05-18 DIAGNOSIS — R17 Unspecified jaundice: Secondary | ICD-10-CM | POA: Diagnosis not present

## 2016-05-18 DIAGNOSIS — R42 Dizziness and giddiness: Secondary | ICD-10-CM | POA: Diagnosis not present

## 2016-05-18 DIAGNOSIS — F4322 Adjustment disorder with anxiety: Secondary | ICD-10-CM | POA: Diagnosis not present

## 2016-05-20 ENCOUNTER — Telehealth: Payer: Self-pay | Admitting: Neurology

## 2016-05-20 NOTE — Telephone Encounter (Signed)
[name] 14-Jun-2041. She had a doppler since she last saw you a couple weeks ago. She said that there was plaque in both Corotids. She is on a medication, but she said it would not take care of the plaque. She said she had been having pain running up her neck. Which is why her PCP ordered the Doppler. Her # is [id number]. Thank you

## 2016-05-20 NOTE — Telephone Encounter (Signed)
I spoke with patient and she is going to let her cardiologist know what is going on as well.

## 2016-05-23 NOTE — Telephone Encounter (Signed)
FYI

## 2016-05-23 NOTE — Telephone Encounter (Signed)
This would not cause her neck pain.  She should discuss specifics of results with her ordering provider.

## 2016-05-23 NOTE — Telephone Encounter (Signed)
Patient will discuss with cardiologist.

## 2016-05-24 ENCOUNTER — Ambulatory Visit: Payer: PPO | Admitting: Cardiology

## 2016-05-25 ENCOUNTER — Telehealth: Payer: Self-pay | Admitting: Cardiology

## 2016-05-25 NOTE — Telephone Encounter (Signed)
Left voicemail message for her to call back.  

## 2016-05-25 NOTE — Telephone Encounter (Signed)
Pt calling stating she had a carotid done by her PCP (see in epic)  Was told to call us for she has some build up of Plaque  Would like to know what needs to be done in order to help her with this.  Please advise

## 2016-05-25 NOTE — Telephone Encounter (Signed)
Patient called and states that her PCP had ordered a carotid ultrasound that came back showing some plaque. She states that the PCP ordered for continued lightheadedness and dizziness. She wanted to make [name] aware that it had been done and get our feedback if she should do anything else. She states that she has reviewed diet and lifestyle changes but just wants to make sure there is not something else that should be done. Let her know that I would forward to Dr. Yvone Neu for her review and be in touch with her. She was appreciative and had no further questions.

## 2016-05-25 NOTE — Telephone Encounter (Signed)
Mild carotid disease, medical therapy. Patient already on aspirin. Cholesterol control with LDL goal is less than 70. The same medical therapy we use for atherosclerosis basically.

## 2016-05-26 NOTE — Telephone Encounter (Signed)
Spoke with patient and reviewed Dr. Tora Kindred review of carotid ultrasound and she was appreciative for the call. Spoke with her about aspirin and cholesterol control and that medical therapy is done for her mild carotid disease. She verbalized understanding of our conversation and had no further questions at this time.

## 2016-06-07 IMAGING — CR DG CHEST 2V
2 series · 2 of 2 positions shown · non-contrast
Comparison: 12/21/2014

CLINICAL DATA: Near syncope for 1 month worse today

EXAM:
CHEST  2 VIEW

[chest pa]
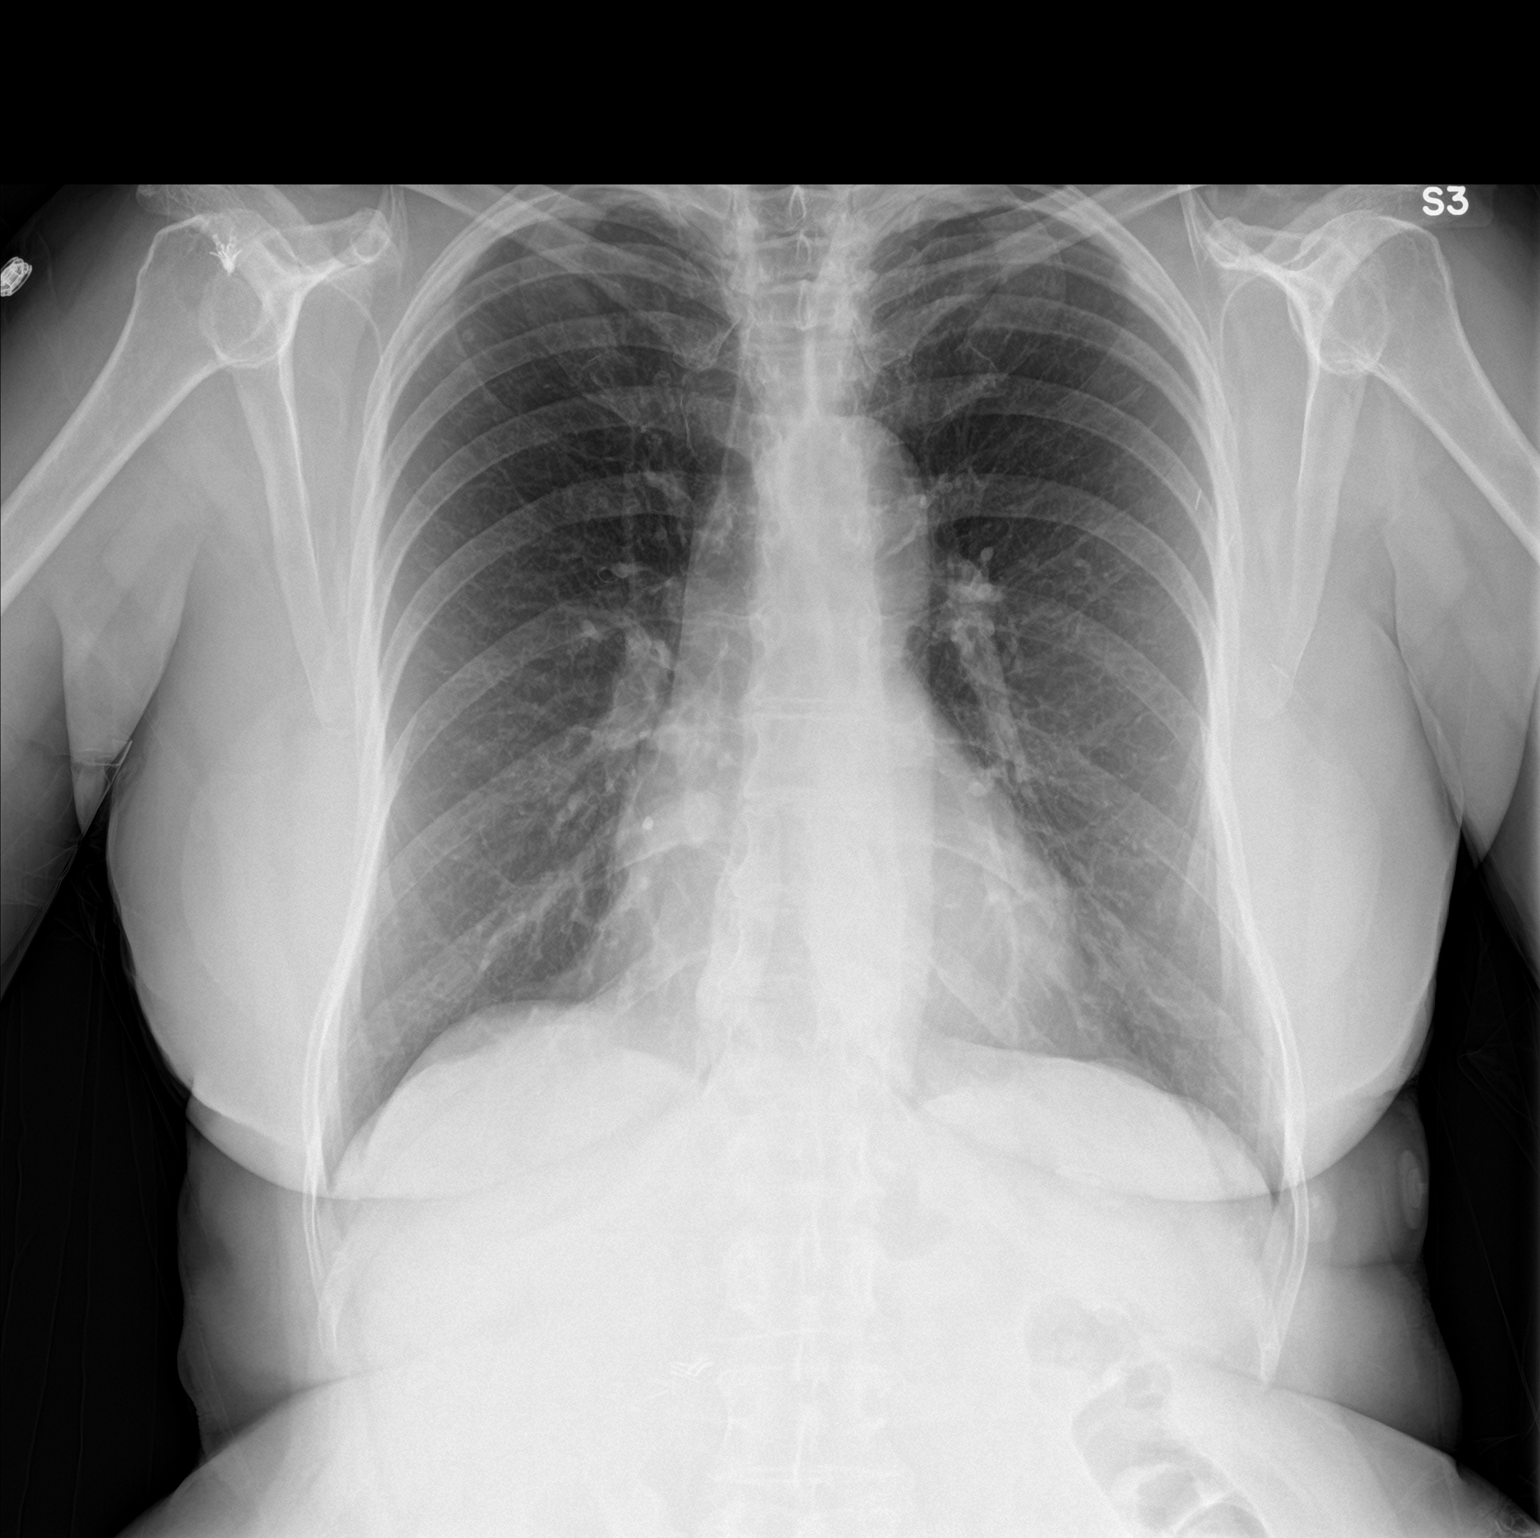

[chest lat]
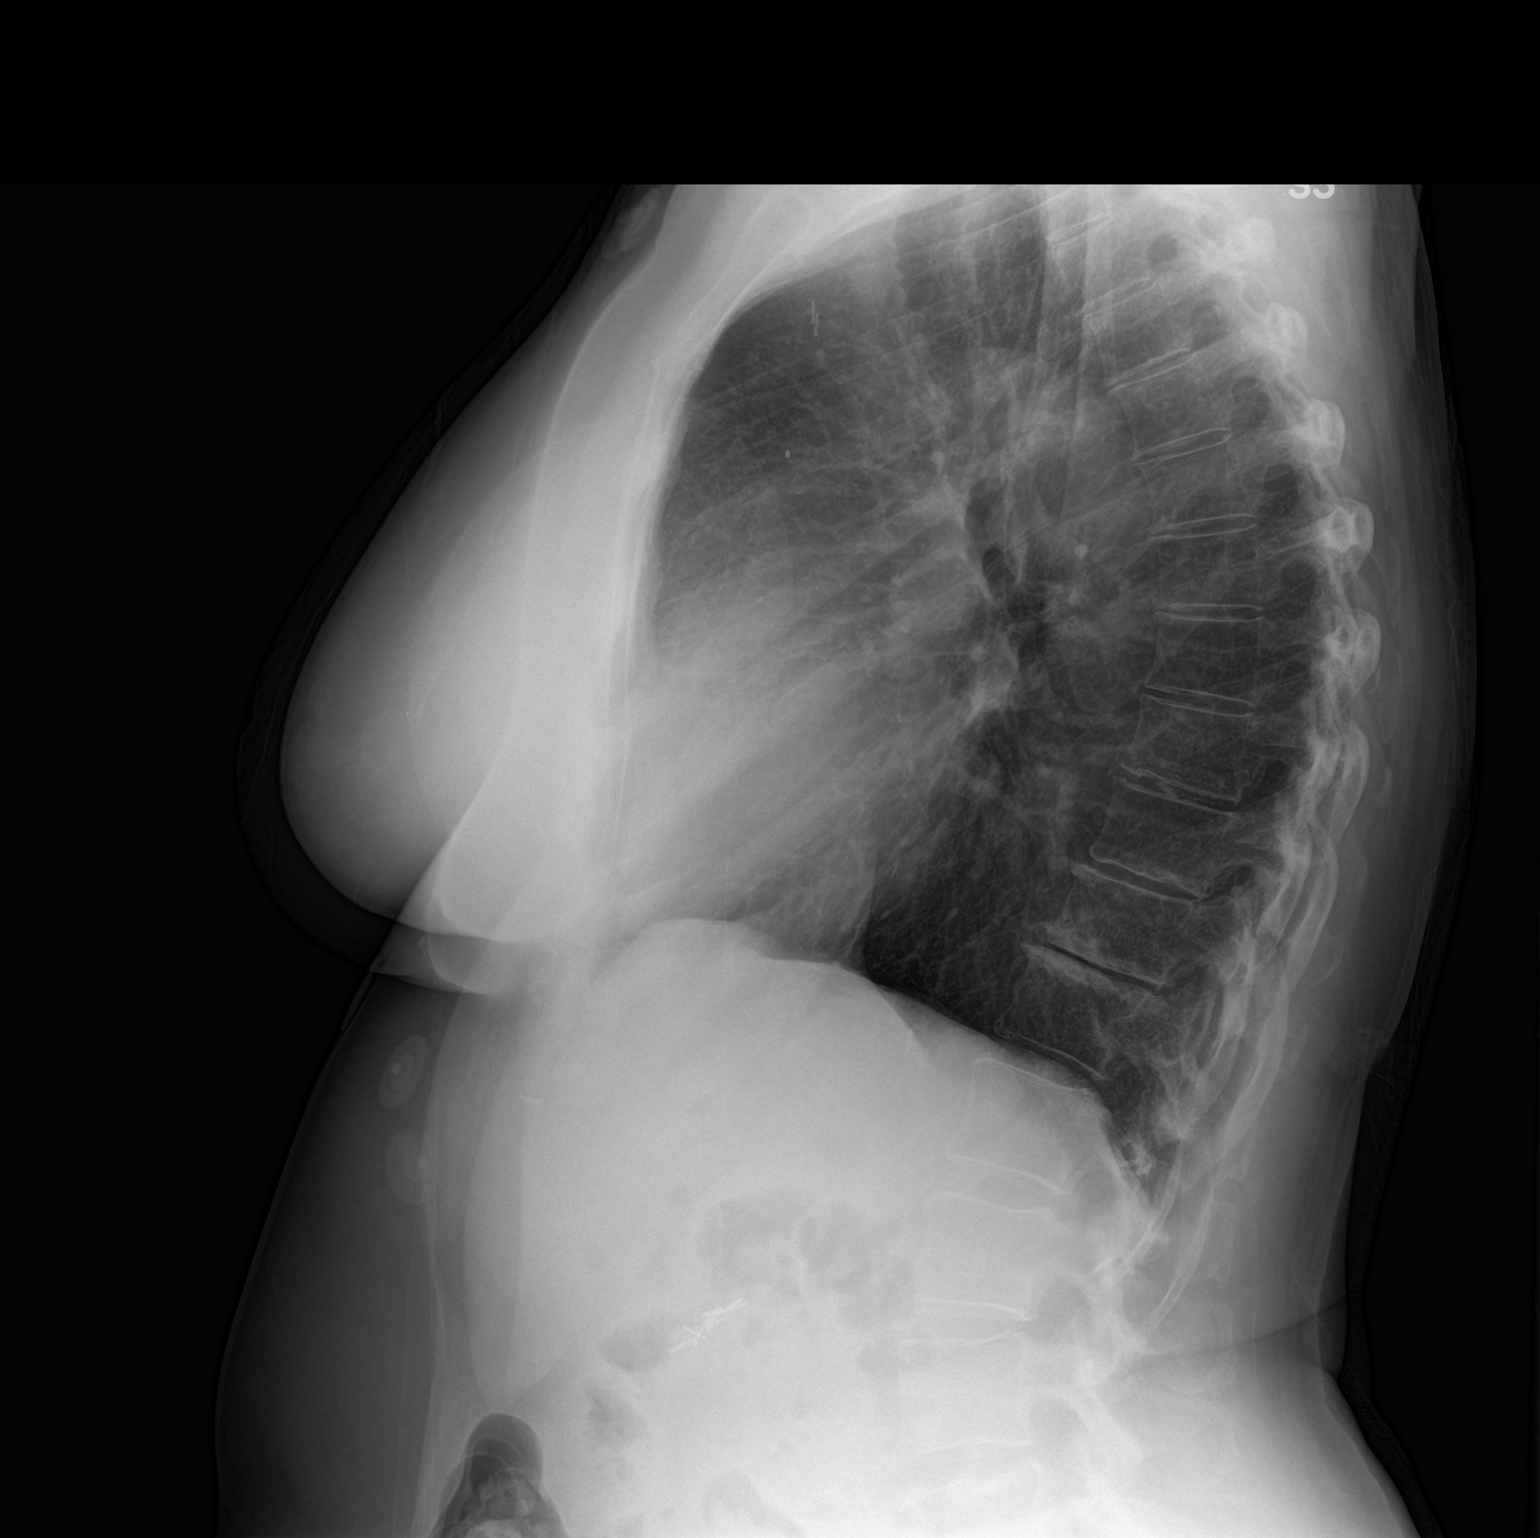

[2 of 2 positions shown; findings below may reference images not displayed]

FINDINGS: The heart size and vascular pattern are normal. No consolidation or
effusion. There is evidence of postsurgical change over the left
axilla which is not seen on the prior study. There is also a 3 mm
and a 6 mm nodular opacity in the right apex which are not seen
previously. No pleural effusions.
IMPRESSION: No acute findings. However, 2 nodular opacities in the right lung
apex are noted. These are not seen on prior radiograph or CT scan
performed 12/21/2014. Consider unenhanced CT thorax to further
evaluate.

## 2016-06-08 DIAGNOSIS — R05 Cough: Secondary | ICD-10-CM | POA: Diagnosis not present

## 2016-06-08 DIAGNOSIS — J3489 Other specified disorders of nose and nasal sinuses: Secondary | ICD-10-CM | POA: Diagnosis not present

## 2016-06-08 DIAGNOSIS — J069 Acute upper respiratory infection, unspecified: Secondary | ICD-10-CM | POA: Diagnosis not present

## 2016-06-28 DIAGNOSIS — Z780 Asymptomatic menopausal state: Secondary | ICD-10-CM | POA: Diagnosis not present

## 2016-06-28 LAB — HM DEXA SCAN: HM Dexa Scan: NORMAL

## 2016-07-01 DIAGNOSIS — S29011A Strain of muscle and tendon of front wall of thorax, initial encounter: Secondary | ICD-10-CM | POA: Diagnosis not present

## 2016-07-01 DIAGNOSIS — N644 Mastodynia: Secondary | ICD-10-CM | POA: Diagnosis not present

## 2016-07-01 DIAGNOSIS — E785 Hyperlipidemia, unspecified: Secondary | ICD-10-CM | POA: Diagnosis not present

## 2016-07-01 DIAGNOSIS — R3911 Hesitancy of micturition: Secondary | ICD-10-CM | POA: Diagnosis not present

## 2016-08-13 DIAGNOSIS — B349 Viral infection, unspecified: Secondary | ICD-10-CM | POA: Diagnosis not present

## 2016-08-29 DIAGNOSIS — M25562 Pain in left knee: Secondary | ICD-10-CM | POA: Diagnosis not present

## 2016-08-29 DIAGNOSIS — K920 Hematemesis: Secondary | ICD-10-CM | POA: Diagnosis not present

## 2016-08-29 DIAGNOSIS — K219 Gastro-esophageal reflux disease without esophagitis: Secondary | ICD-10-CM | POA: Diagnosis not present

## 2016-08-29 DIAGNOSIS — F411 Generalized anxiety disorder: Secondary | ICD-10-CM | POA: Diagnosis not present

## 2016-08-29 DIAGNOSIS — I2584 Coronary atherosclerosis due to calcified coronary lesion: Secondary | ICD-10-CM | POA: Diagnosis not present

## 2016-09-02 DIAGNOSIS — L570 Actinic keratosis: Secondary | ICD-10-CM | POA: Diagnosis not present

## 2016-09-02 DIAGNOSIS — Z85828 Personal history of other malignant neoplasm of skin: Secondary | ICD-10-CM | POA: Diagnosis not present

## 2016-09-02 DIAGNOSIS — Z808 Family history of malignant neoplasm of other organs or systems: Secondary | ICD-10-CM | POA: Diagnosis not present

## 2016-09-02 DIAGNOSIS — L821 Other seborrheic keratosis: Secondary | ICD-10-CM | POA: Diagnosis not present

## 2016-09-02 DIAGNOSIS — Z87898 Personal history of other specified conditions: Secondary | ICD-10-CM | POA: Diagnosis not present

## 2016-09-02 DIAGNOSIS — Z23 Encounter for immunization: Secondary | ICD-10-CM | POA: Diagnosis not present

## 2016-09-02 DIAGNOSIS — Z85820 Personal history of malignant melanoma of skin: Secondary | ICD-10-CM | POA: Diagnosis not present

## 2016-09-09 ENCOUNTER — Encounter: Payer: Self-pay | Admitting: Gastroenterology

## 2016-09-09 ENCOUNTER — Ambulatory Visit (INDEPENDENT_AMBULATORY_CARE_PROVIDER_SITE_OTHER): Payer: PPO | Admitting: Gastroenterology

## 2016-09-09 ENCOUNTER — Encounter (INDEPENDENT_AMBULATORY_CARE_PROVIDER_SITE_OTHER): Payer: Self-pay

## 2016-09-09 VITALS — BP 120/60 | HR 72 | Ht 64.57 in | Wt 187.5 lb

## 2016-09-09 DIAGNOSIS — K920 Hematemesis: Secondary | ICD-10-CM | POA: Diagnosis not present

## 2016-09-09 DIAGNOSIS — K219 Gastro-esophageal reflux disease without esophagitis: Secondary | ICD-10-CM

## 2016-09-09 MED ORDER — OMEPRAZOLE 20 MG PO CPDR: 20.0000 mg | DELAYED_RELEASE_CAPSULE | Freq: Every day | ORAL | 3 refills | Status: DC

## 2016-09-09 NOTE — Progress Notes (Signed)
HPI :  76 year old female here for a follow-up visit for reflux.   She has long-standing reflux disease which has historically been well controlled with PPI. Given her family history of dementia the last time I saw her in 2016 there was concern for potential increased risk of dementia with PPIs, we had recommended switching from nexium to zantac. Patient reports she had weaning off PPI to zantac but had not been working. She had tried other supplements to treat reflux but did not work.   She reported a few weeks ago she had severe symptoms of reflux, had one occurrence of vomiting with a fair amount of blood in it. This occurred 3 weeks ago, and she had severe heartburn in this setting trying to get off nexium. She has since gone back on prilosec 20mg  po OTC once daily for about 3 weeks. She reports her heartburn is pretty well controlled on this regimen now feeling much better, she has breakthrough 2-3 days per week, mild.  She denies any dysphagia, sometimes regurgitation. No weight loss.   She has never had a prior upper endoscopy.   Colonoscopy last done by [facility] in May 2014, tortous colon per patient with severe abdominal pains following the procedure, although no perforation noted. She denies any polyps being removed and states it was normal.  She was told "I should never have a colonoscopy again due to tortous colon".   Past Medical History:  Diagnosis Date  . Acid reflux   . Anemia    3 years ago  . Anxiety    in past  . Arthritis    all over  . Atypical moles   . Bronchitis   . CAD (coronary artery disease)   . Complication of anesthesia    shoulder surgery- not all the way asleep  . Depression    in past  . Dizziness   . Fatigue   . Generalized headache    migraines  . GERD (gastroesophageal reflux disease)   . History of chemotherapy    topical cream for h/o skin CA  . Hypothyroidism   . Insomnia   . Irritable bowel syndrome   . LLQ abdominal pain   . Melanoma  ([place]) 2013  . Melanoma ([place])    x2 stg II melanoma  . Neuropathy, peripheral ([place])   . Retina disorder    left  . Thyroid disease   . Weight gain      Past Surgical History:  Procedure Laterality Date  . CHOLECYSTECTOMY N/A 01/28/2015   Procedure: LAPAROSCOPIC CHOLECYSTECTOMY WITH INTRAOPERATIVE CHOLANGIOGRAM;  Surgeon: Autumn Messing III, MD;  Location: [facility];  Service: General;  Laterality: N/A;  . CLOSED MANIPULATION SHOULDER     rt  . COLONOSCOPY WITH PROPOFOL N/A 11/20/2012   Procedure: COLONOSCOPY WITH PROPOFOL;  Surgeon: Garlan Fair, MD;  Location: [facility];  Service: Endoscopy;  Laterality: N/A;  . colonscopy     May 2014  . JOINT REPLACEMENT     RTK  . KNEE ARTHROSCOPY    . left knee miniscus tear repair    . MELANOMA EXCISION WITH SENTINEL LYMPH NODE BIOPSY  03/18/11   Back, nodes neg  . ROTATOR CUFF REPAIR     left  . SHOULDER SURGERY     right  . TOTAL KNEE ARTHROPLASTY     right  . TUMOR REMOVAL     ovary   Family History  Problem Relation Age of Onset  . Hypertension Mother   .  Stroke Mother   . Heart disease Father   . Diabetes Father   . Hypertension Father   . Hyperlipidemia Father   . Diabetes Sister   . Heart disease Sister     heart murmur  . Heart disease Brother   . Pancreatic cancer Maternal Uncle   . Alzheimer's disease Paternal Aunt   . Colon cancer Maternal Grandfather   . Alzheimer's disease Paternal Aunt   . Alzheimer's disease Paternal Aunt   . Colon polyps Sister     adenomous   Social History  Substance Use Topics  . Smoking status: Never Smoker  . Smokeless tobacco: Never Used  . Alcohol use 0.6 oz/week    1 Cans of beer per week     Comment: once a week beer or wine   Current Outpatient Prescriptions  Medication Sig Dispense Refill  . albuterol (PROVENTIL HFA;VENTOLIN HFA) 108 (90 BASE) MCG/ACT inhaler Inhale 1-2 puffs into the lungs every 6 (six) hours as needed for wheezing or shortness of breath.    . Ascorbic Acid  (VITAMIN C) 1000 MG tablet Take 1,000 mg by mouth daily.    Marland Kitchen aspirin EC 81 MG tablet Take 81 mg by mouth daily.    Marland Kitchen atorvastatin (LIPITOR) 10 MG tablet Take 1 tablet by mouth daily.    . Biotin 5000 MCG CAPS Take 5,000 mcg by mouth daily.    Marland Kitchen CALCIUM PO Take 500 mg by mouth daily.    . Cholecalciferol (VITAMIN D3) 2000 UNITS capsule Take 2,000 Units by mouth daily.    Marland Kitchen levothyroxine (SYNTHROID) 50 MCG tablet Take 1 tablet (50 mcg total) by mouth daily before breakfast. 30 tablet 1  . Multiple Vitamin (MULTIVITAMIN WITH MINERALS) TABS tablet Take 1 tablet by mouth daily.    . Omega-3 Fatty Acids (FISH OIL) 1200 MG CAPS Take 1,200 mg by mouth 3 (three) times daily.     No current facility-administered medications for this visit.    Allergies  Allergen Reactions  . Epinephrine Other (See Comments)    Heart palpitations  . Penicillins Swelling    Lip swelling Has patient had a PCN reaction causing immediate rash, facial/tongue/throat swelling, SOB or lightheadedness with hypotension: Yes Has patient had a PCN reaction causing severe rash involving mucus membranes or skin necrosis: No Has patient had a PCN reaction that required hospitalization No Has patient had a PCN reaction occurring within the last 10 years: No If all of the above answers are "NO", then may proceed with Cephalosporin use.  . Valium [Diazepam] Other (See Comments)    sedation     Review of Systems: All systems reviewed and negative except where noted in HPI.   Lab Results  Component Value Date   WBC 7.0 04/07/2016   HGB 14.9 04/07/2016   HCT 45.7 04/07/2016   MCV 91.1 04/07/2016   PLT 251 04/07/2016    Lab Results  Component Value Date   CREATININE 0.9 04/07/2016   BUN 23.0 04/07/2016   NA 143 04/07/2016   K 4.0 04/07/2016   CL 108 08/10/2015   CO2 21 (L) 04/07/2016    Lab Results  Component Value Date   ALT 16 04/07/2016   AST 12 04/07/2016   ALKPHOS 79 04/07/2016   BILITOT 1.40 (H)  04/07/2016     Physical Exam: BP 120/60 (BP Location: Left Arm, Patient Position: Sitting, Cuff Size: Normal)   Pulse 72   Ht 5' 4.57" (1.64 m) Comment: height measured without shoes  Wt 187 lb 8 oz (85 kg)   BMI 31.62 kg/m  Constitutional: Pleasant,well-developed, female in no acute distress. HEENT: Normocephalic and atraumatic. Conjunctivae are normal. No scleral icterus. Neck supple.  Cardiovascular: Normal rate, regular rhythm.  Pulmonary/chest: Effort normal and breath sounds normal. No wheezing, rales or rhonchi. Abdominal: Soft, nondistended, nontender. There are no masses palpable. No hepatomegaly. Extremities: no edema Lymphadenopathy: No cervical adenopathy noted. Neurological: Alert and oriented to person place and time. Skin: Skin is warm and dry. No rashes noted. Psychiatric: Normal mood and affect. Behavior is normal.   ASSESSMENT AND PLAN: 76 year old female with history as outlined above here for follow-up for GERD. This is been poorly controlled on non-PPI alternatives, including 1 episode of hematemesis in recent weeks. Her symptoms of reflux have significantly improved since resuming PPI.   I discussed the long-term risks and benefits of PPI with her at length. While she does have a strong family history of dementia, more recent literature has come out since I have last seen her which have argued there is no relationship between PPI use and the development of dementia. I reassured her this light. Further studies are being done to clarify if there is increased risk of cardiovascular disease with PPI. While we would like to use the lowest possible dose or use of an alternative regimen for long term reflux management in general, I think the benefits of PPI outweigh the risk for her at this time. She will continue Prilosec 20 mg and she can try Gaviscon as needed for any breakthrough. In light of her hematemesis episode, which I suspect may been due to erosive esophagitis, I  have recommended an upper endoscopy for her given she's never had one before to further evaluate this issue. Discussed risks and benefits of endoscopy with her and she wished proceed. All questions answered  Gladstone Cellar, MD [facility] Pager [phone]

## 2016-09-09 NOTE — Patient Instructions (Signed)
If you are age 76 or older, your body mass index should be between 23-30. Your Body mass index is 31.62 kg/m. If this is out of the aforementioned range listed, please consider follow up with your Primary Care Provider.  If you are age 26 or younger, your body mass index should be between 19-25. Your Body mass index is 31.62 kg/m. If this is out of the aformentioned range listed, please consider follow up with your Primary Care Provider.   We have sent the following medications to your pharmacy for you to pick up at your convenience:  Omeprazole  You have been scheduled for an endoscopy. Please follow written instructions given to you at your visit today. If you use inhalers (even only as needed), please bring them with you on the day of your procedure. Your physician has requested that you go to www.startemmi.com and enter the access code given to you at your visit today. This web site gives a general overview about your procedure. However, you should still follow specific instructions given to you by our office regarding your preparation for the procedure.  Thank you.

## 2016-09-14 ENCOUNTER — Encounter: Payer: Self-pay | Admitting: Gastroenterology

## 2016-09-14 ENCOUNTER — Telehealth: Payer: Self-pay | Admitting: Gastroenterology

## 2016-09-14 NOTE — Telephone Encounter (Signed)
Spoke to patient, she was to see her dermatologist who wanted her to have an xray as part of her one year follow up from previous melanoma, told her to contact PCP to get this. She did that and was told that since she is having a procedure by Dr. Havery Moros on 3/14 it could just be part of her pre-op. I explained that he has not seen her for this issue and is not part of the work up prior to an EGD. She really needs to go back to her dermatologist who wanted this done and they can order this. Report will need to go to the proper physician.

## 2016-09-21 ENCOUNTER — Telehealth: Payer: Self-pay

## 2016-09-21 NOTE — Telephone Encounter (Signed)
Left message for patient, that we do have an opening the day before her procedure. If she would like to move it to that day to please call office.

## 2016-09-22 NOTE — Telephone Encounter (Signed)
Patient called back and stated she would like to keep appointment scheduled for 09/28/16 at 10am for EGD.

## 2016-09-26 ENCOUNTER — Ambulatory Visit: Payer: PPO | Admitting: Gastroenterology

## 2016-09-28 ENCOUNTER — Ambulatory Visit (AMBULATORY_SURGERY_CENTER): Payer: PPO | Admitting: Gastroenterology

## 2016-09-28 ENCOUNTER — Encounter: Payer: Self-pay | Admitting: Gastroenterology

## 2016-09-28 ENCOUNTER — Other Ambulatory Visit (HOSPITAL_COMMUNITY)
Admission: RE | Admit: 2016-09-28 | Discharge: 2016-09-28 | Disposition: A | Discharge status: Home / Self Care | Payer: PPO | Source: Ambulatory Visit | Attending: Gastroenterology | Admitting: Gastroenterology

## 2016-09-28 VITALS — BP 121/65 | HR 56 | Temp 96.8°F | Resp 16 | Ht 64.0 in | Wt 187.0 lb

## 2016-09-28 DIAGNOSIS — K920 Hematemesis: Secondary | ICD-10-CM

## 2016-09-28 DIAGNOSIS — K219 Gastro-esophageal reflux disease without esophagitis: Secondary | ICD-10-CM | POA: Diagnosis not present

## 2016-09-28 DIAGNOSIS — B3781 Candidal esophagitis: Secondary | ICD-10-CM

## 2016-09-28 DIAGNOSIS — K229 Disease of esophagus, unspecified: Secondary | ICD-10-CM | POA: Diagnosis not present

## 2016-09-28 DIAGNOSIS — B379 Candidiasis, unspecified: Secondary | ICD-10-CM | POA: Diagnosis not present

## 2016-09-28 MED ORDER — SODIUM CHLORIDE 0.9 % IV SOLN: 500.0000 mL | INTRAVENOUS | Status: DC

## 2016-09-28 NOTE — Progress Notes (Signed)
Called to room to assist during endoscopic procedure.  Patient ID and intended procedure confirmed with present staff. Received instructions for my participation in the procedure from the performing physician.  

## 2016-09-28 NOTE — Progress Notes (Signed)
Specimen given to [facility] in cytology at [facility] lab

## 2016-09-28 NOTE — Progress Notes (Signed)
To PACU, vss patent aw report to rn 

## 2016-09-28 NOTE — Patient Instructions (Signed)

## 2016-09-28 NOTE — Op Note (Signed)
[facility] Patient Name: [name] Procedure Date: 09/28/2016 9:57 AM MRN: [id number] Endoscopist: Remo Lipps P. Armbruster MD, MD Age: 76 Referring MD:  Date of Birth: 10-06-1940 Gender: Female Account #: [id number] Procedure:                Upper GI endoscopy Indications:              Heartburn, history of Hematemesis in setting of                            poorly controlled reflux, now on PPI with                            improvement of symptoms Medicines:                Monitored Anesthesia Care Procedure:                Pre-Anesthesia Assessment:                           - Prior to the procedure, a History and Physical                            was performed, and patient medications and                            allergies were reviewed. The patient's tolerance of                            previous anesthesia was also reviewed. The risks                            and benefits of the procedure and the sedation                            options and risks were discussed with the patient.                            All questions were answered, and informed consent                            was obtained. Prior Anticoagulants: The patient has                            taken no previous anticoagulant or antiplatelet                            agents. ASA Grade Assessment: III - A patient with                            severe systemic disease. After reviewing the risks                            and benefits, the patient was deemed in  satisfactory condition to undergo the procedure.                           After obtaining informed consent, the endoscope was                            passed under direct vision. Throughout the                            procedure, the patient's blood pressure, pulse, and                            oxygen saturations were monitored continuously. The                            Endoscope was introduced  through the mouth, and                            advanced to the second part of duodenum. The upper                            GI endoscopy was accomplished without difficulty.                            The patient tolerated the procedure well. Scope In: Scope Out: Findings:                 Esophagogastric landmarks were identified: the                            Z-line was found at 32 cm, the gastroesophageal                            junction was found at 32 cm and the upper extent of                            the gastric folds was found at 35 cm from the                            incisors.                           A 3 cm hiatal hernia was present.                           Multiple diminutive plaques were found in the                            proximal esophagus and in the mid esophagus                            concerning for possible esophageal candidiasis.                            Brushings  were obtained in the upper third of the                            esophagus and in the middle third of the esophagus.                           The exam of the esophagus was otherwise normal.                           The entire examined stomach was normal.                           The duodenal bulb and second portion of the                            duodenum were normal. Complications:            No immediate complications. Estimated blood loss:                            None. Estimated Blood Loss:     Estimated blood loss: none. Impression:               - Esophagogastric landmarks identified.                           - 3 cm hiatal hernia.                           - Multiple plaques in the proximal esophagus and in                            the mid esophagus, suspicious for esophageal                            candidiasis. Brushings performed.                           - No evidence of Barrett's or esophagitis                           - Normal stomach.                            - Normal duodenal bulb and second portion of the                            duodenum.                           Overall, suspect patient previously had esophagitis                            causing hematemesis based on symptoms. Now resolved                            on PPI.  Recommendation:           - Patient has a contact number available for                            emergencies. The signs and symptoms of potential                            delayed complications were discussed with the                            patient. Return to normal activities tomorrow.                            Written discharge instructions were provided to the                            patient.                           - Resume previous diet.                           - Continue present medications.                           - Await pathology results. Remo Lipps P. Armbruster MD, MD 09/28/2016 10:14:50 AM This report has been signed electronically.

## 2016-09-29 ENCOUNTER — Telehealth: Payer: Self-pay | Admitting: *Deleted

## 2016-09-29 NOTE — Telephone Encounter (Signed)
  Follow up Call-  Call back number 09/28/2016  Post procedure Call Back phone  # [phone]  Permission to leave phone message Yes  Some recent data might be hidden     Patient questions:  Do you have a fever, pain , or abdominal swelling? No. Pain Score  0 *  Have you tolerated food without any problems? Yes.    Have you been able to return to your normal activities? Yes.    Do you have any questions about your discharge instructions: Diet   No. Medications  No. Follow up visit  No.  Do you have questions or concerns about your Care? Yes, answered questions.   Actions: * If pain score is 4 or above: No action needed, pain <4.

## 2016-09-30 DIAGNOSIS — M1712 Unilateral primary osteoarthritis, left knee: Secondary | ICD-10-CM | POA: Diagnosis not present

## 2016-10-03 DIAGNOSIS — K219 Gastro-esophageal reflux disease without esophagitis: Secondary | ICD-10-CM | POA: Diagnosis not present

## 2016-10-03 DIAGNOSIS — R42 Dizziness and giddiness: Secondary | ICD-10-CM | POA: Diagnosis not present

## 2016-10-03 DIAGNOSIS — F411 Generalized anxiety disorder: Secondary | ICD-10-CM | POA: Diagnosis not present

## 2016-10-03 DIAGNOSIS — E785 Hyperlipidemia, unspecified: Secondary | ICD-10-CM | POA: Diagnosis not present

## 2016-10-03 DIAGNOSIS — I2584 Coronary atherosclerosis due to calcified coronary lesion: Secondary | ICD-10-CM | POA: Diagnosis not present

## 2016-10-06 ENCOUNTER — Ambulatory Visit (INDEPENDENT_AMBULATORY_CARE_PROVIDER_SITE_OTHER): Payer: PPO | Admitting: Cardiology

## 2016-10-06 ENCOUNTER — Encounter: Payer: Self-pay | Admitting: Cardiology

## 2016-10-06 VITALS — BP 126/62 | HR 75 | Ht 66.5 in | Wt 187.2 lb

## 2016-10-06 DIAGNOSIS — I7090 Unspecified atherosclerosis: Secondary | ICD-10-CM

## 2016-10-06 NOTE — Progress Notes (Signed)
Cardiology Office Note   Date:  10/06/2016   ID:  [name], DOB 11/14/1940, MRN [id number]  Referring Doctor:  Birdie Sons, MD (Inactive)   Cardiologist:   Wende Bushy, MD   Reason for consultation:  Chief Complaint  Patient presents with  . OTHER    Discuss doppler results       History of Present Illness: [name] is a 76 y.o. female who presents for Follow-up after testing   Patient was told by her PCP to follow-up with [name]. She had carotid ultrasounds ordered by her PCP as part of the workup for her symptoms of lightheadedness last year. We discussed that we did comment on the results as it was forwarded to our office. She was already taking aspirin and atorvastatin and that will be the same treatment for mild carotid artery disease.  Since last visit, she has not been exercising regularly but no chest pain or shortness of breath. She continues to watch her diet really closely. She knows she needs to lose at least 40 pounds.  ROS:  Please see the history of present illness. Aside from mentioned under HPI, all other systems are reviewed and negative.    Past Medical History:  Diagnosis Date  . Acid reflux   . Anemia    3 years ago  . Anxiety    in past  . Arthritis    all over  . Atypical moles   . Bronchitis   . CAD (coronary artery disease)   . Carotid artery calcification, bilateral 04/2016  . Complication of anesthesia    shoulder surgery- not all the way asleep  . Depression    in past  . Dizziness   . Fatigue   . Generalized headache    migraines  . GERD (gastroesophageal reflux disease)   . History of chemotherapy    topical cream for h/o skin CA  . Hypothyroidism   . Insomnia   . Irritable bowel syndrome   . LLQ abdominal pain   . Melanoma ([place]) 2013  . Melanoma ([place])    x2 stg II melanoma  . Neuropathy, peripheral ([place])   . Retina disorder    left  . Thyroid disease   . Weight gain     Past Surgical History:    Procedure Laterality Date  . CHOLECYSTECTOMY N/A 01/28/2015   Procedure: LAPAROSCOPIC CHOLECYSTECTOMY WITH INTRAOPERATIVE CHOLANGIOGRAM;  Surgeon: Autumn Messing III, MD;  Location: [place];  Service: General;  Laterality: N/A;  . CLOSED MANIPULATION SHOULDER     rt  . COLONOSCOPY WITH PROPOFOL N/A 11/20/2012   Procedure: COLONOSCOPY WITH PROPOFOL;  Surgeon: Garlan Fair, MD;  Location: [facility];  Service: Endoscopy;  Laterality: N/A;  . colonscopy     May 2014  . JOINT REPLACEMENT     RTK  . KNEE ARTHROSCOPY    . left knee miniscus tear repair    . MELANOMA EXCISION WITH SENTINEL LYMPH NODE BIOPSY  03/18/11   Back, nodes neg  . ROTATOR CUFF REPAIR     left  . SHOULDER SURGERY     right  . TOTAL KNEE ARTHROPLASTY     right  . TUMOR REMOVAL     ovary     reports that she has never smoked. She has never used smokeless tobacco. She reports that she drinks about 0.6 oz of alcohol per week . She reports that she does not use drugs.   family history includes Alzheimer's disease  in her paternal aunt, paternal aunt, and paternal aunt; Colon cancer in her maternal grandfather; Colon polyps in her sister; Diabetes in her father and sister; Heart disease in her brother, father, and sister; Hyperlipidemia in her father; Hypertension in her father and mother; Pancreatic cancer in her maternal uncle; Stroke in her mother.   Outpatient Medications Prior to Visit  Medication Sig Dispense Refill  . albuterol (PROVENTIL HFA;VENTOLIN HFA) 108 (90 BASE) MCG/ACT inhaler Inhale 1-2 puffs into the lungs every 6 (six) hours as needed for wheezing or shortness of breath.    . Ascorbic Acid (VITAMIN C) 1000 MG tablet Take 1,000 mg by mouth daily.    Marland Kitchen aspirin EC 81 MG tablet Take 81 mg by mouth daily.    Marland Kitchen atorvastatin (LIPITOR) 10 MG tablet Take 1 tablet by mouth daily.    . Biotin 5000 MCG CAPS Take 5,000 mcg by mouth daily.    Marland Kitchen CALCIUM PO Take 500 mg by mouth daily.    . Cholecalciferol (VITAMIN D3)  2000 UNITS capsule Take 2,000 Units by mouth daily.    Marland Kitchen levothyroxine (SYNTHROID) 50 MCG tablet Take 1 tablet (50 mcg total) by mouth daily before breakfast. 30 tablet 1  . Multiple Vitamin (MULTIVITAMIN WITH MINERALS) TABS tablet Take 1 tablet by mouth daily.    . Omega-3 Fatty Acids (FISH OIL) 1200 MG CAPS Take 1,200 mg by mouth 3 (three) times daily.    Marland Kitchen omeprazole (PRILOSEC) 20 MG capsule Take 1 capsule (20 mg total) by mouth daily. 90 capsule 3   Facility-Administered Medications Prior to Visit  Medication Dose Route Frequency Provider Last Rate Last Dose  . 0.9 %  sodium chloride infusion  500 mL Intravenous Continuous Manus Gunning, MD         Allergies: Epinephrine; Penicillins; and Valium [diazepam]    PHYSICAL EXAM: VS:  BP 126/62 (BP Location: Left Arm, Patient Position: Sitting, Cuff Size: Normal)   Pulse 75   Ht 5' 6.5" (1.689 m)   Wt 187 lb 4 oz (84.9 kg)   BMI 29.77 kg/m  , Body mass index is 29.77 kg/m. Wt Readings from Last 3 Encounters:  10/06/16 187 lb 4 oz (84.9 kg)  09/28/16 187 lb (84.8 kg)  09/09/16 187 lb 8 oz (85 kg)    GENERAL:  well developed, well nourished, not in acute distress HEENT: normocephalic, pink conjunctivae, anicteric sclerae, no xanthelasma, normal dentition, oropharynx clear NECK:  no neck vein engorgement, JVP normal, no hepatojugular reflux, carotid upstroke brisk and symmetric, no bruit, no thyromegaly, no lymphadenopathy LUNGS:  good respiratory effort, clear to auscultation bilaterally CV:  PMI not displaced, no thrills, no lifts, S1 and S2 within normal limits, no palpable S3 or S4, no murmurs, no rubs, no gallops ABD:  Soft, nontender, nondistended, normoactive bowel sounds, no abdominal aortic bruit, no hepatomegaly, no splenomegaly MS: nontender back, no kyphosis, no scoliosis, no joint deformities EXT:  2+ DP/PT pulses, no edema, no varicosities, no cyanosis, no clubbing SKIN: warm, nondiaphoretic, normal turgor, no  ulcers NEUROPSYCH: alert, oriented to person, place, and time, sensory/motor grossly intact, normal mood, appropriate affect   Recent Labs: 04/07/2016: ALT 16; BUN 23.0; Creatinine 0.9; HGB 14.9; Platelets 251; Potassium 4.0; Sodium 143   Lipid Panel    Component Value Date/Time   CHOL 181 12/22/2014 0432   TRIG 79 12/22/2014 0432   HDL 57 12/22/2014 0432   CHOLHDL 3.2 12/22/2014 0432   VLDL 16 12/22/2014 0432   LDLCALC 108 (  H) 12/22/2014 5784     Other studies Reviewed:  EKG:  The ekg from09/25/2017 was personally reviewed by me and it revealed sinus rhythm, 67 BPM, low voltage QRS.  Additional studies/ records that were reviewed personally reviewed by me today include:  Echo 05/20/2014:  - Normal LV size and systolic function, EF 69-62%. Normal RV size and systolic function. No significant valvular abnormalities.  Echocardiogram 05/11/2016: - Left ventricle: The cavity size was normal. Systolic function was   normal. The estimated ejection fraction was in the range of 60%   to 65%. Wall motion was normal; there were no regional wall   motion abnormalities. Doppler parameters are consistent with   abnormal left ventricular relaxation (grade 1 diastolic   dysfunction). - Left atrium: The atrium was normal in size. - Right ventricle: Systolic function was normal. - Pulmonary arteries: Systolic pressure was within the normal   range.  Nuclear stress test 05/02/2016:  There was no ST segment deviation noted during stress.  The study is normal.  This is a low risk study.  The left ventricular ejection fraction is normal (55-65%).   Carotid ultrasound 05/12/2016: IMPRESSION: Less than 50% stenosis in the right and left internal carotid arteries.    ASSESSMENT AND PLAN:  Note of aortic and coronary calcification on the CT scan of the chest Coronary atherosclerosis LVEF normal, no evidence of ischemia and stresses. Continue medical therapy with aspirin,  statin therapy. LDL goal is less than 70. Risk factor modification: Dietary changes, increased physical activity, weight loss recommended. patient verbalized understanding.   Mild carotid disease No carotid bruit noted on physical exam. The test was ordered by PCP as part of the workup for her lightheadedness. Continue aspirin and atorvastatin as mentioned above.  Current medicines are reviewed at length with the patient today.  The patient does not have concerns regarding medicines.  Labs/ tests ordered today include:  Orders Placed This Encounter  Procedures  . EKG 12-Lead    I had a lengthy and detailed discussion with the patient regarding diagnoses, prognosis, diagnostic options, treatment options, and side effects of medications.   I counseled the patient on importance of lifestyle modification including heart healthy diet, regular physical activity.   Disposition:   FU with Cardiology as needed, if there are any new symptoms.   Signed, Wende Bushy, MD  10/06/2016 1:34 PM    [place]  This note was generated in part with voice recognition software and I apologize for any typographical errors that were not detected and corrected.

## 2016-10-06 NOTE — Patient Instructions (Signed)
Follow-Up: Your physician recommends that you schedule a follow-up appointment as needed.   It was a pleasure seeing you today here in the office. Please do not hesitate to give us a call back if you have any further questions. [phone]  Sanaiyah Kirchhoff A. RN, BSN    

## 2016-10-07 ENCOUNTER — Other Ambulatory Visit: Payer: Self-pay

## 2016-10-07 ENCOUNTER — Telehealth: Payer: Self-pay | Admitting: Gastroenterology

## 2016-10-07 DIAGNOSIS — M1712 Unilateral primary osteoarthritis, left knee: Secondary | ICD-10-CM | POA: Diagnosis not present

## 2016-10-07 MED ORDER — FLUCONAZOLE 200 MG PO TABS: 200.0000 mg | ORAL_TABLET | ORAL | 0 refills | Status: DC

## 2016-10-13 DIAGNOSIS — M1712 Unilateral primary osteoarthritis, left knee: Secondary | ICD-10-CM | POA: Diagnosis not present

## 2016-10-18 DIAGNOSIS — Z1231 Encounter for screening mammogram for malignant neoplasm of breast: Secondary | ICD-10-CM | POA: Diagnosis not present

## 2016-10-18 DIAGNOSIS — Z803 Family history of malignant neoplasm of breast: Secondary | ICD-10-CM | POA: Diagnosis not present

## 2016-10-18 LAB — HM MAMMOGRAPHY

## 2016-10-25 ENCOUNTER — Ambulatory Visit: Payer: PPO | Admitting: Podiatry

## 2016-10-26 ENCOUNTER — Telehealth: Payer: Self-pay | Admitting: Neurology

## 2016-10-26 NOTE — Telephone Encounter (Signed)
Caller: PT  Urgent? No  Reason for the call: PT said she had a doplar done a few months ago and would like Dr Posey Pronto to look at it on her next visit/Dawn

## 2016-10-26 NOTE — Telephone Encounter (Signed)
FYI

## 2016-10-27 NOTE — Telephone Encounter (Signed)
Noted  

## 2016-10-31 ENCOUNTER — Encounter: Payer: Self-pay | Admitting: Neurology

## 2016-10-31 ENCOUNTER — Ambulatory Visit (INDEPENDENT_AMBULATORY_CARE_PROVIDER_SITE_OTHER): Payer: PPO | Admitting: Neurology

## 2016-10-31 VITALS — BP 120/70 | HR 122 | Ht 66.5 in | Wt 185.1 lb

## 2016-10-31 DIAGNOSIS — G609 Hereditary and idiopathic neuropathy, unspecified: Secondary | ICD-10-CM | POA: Diagnosis not present

## 2016-10-31 DIAGNOSIS — G43109 Migraine with aura, not intractable, without status migrainosus: Secondary | ICD-10-CM

## 2016-10-31 NOTE — Patient Instructions (Addendum)
Follow-up with primary care doctor about abdominal pain Return to clinic in 1 year

## 2016-10-31 NOTE — Progress Notes (Signed)
Follow-up Visit   Date: 10/31/16    [name] MRN: [id number] DOB: [birth year]   Interim History: [name] is a 76 y.o. left-handed Caucasian female with GERD, hypothyroidism, stage IA malignant melanoma of the back (2012) s/p excision, idiopathic neuropathy, and GERD returning to the clinic with new complaints visual auras.  The patient was accompanied to the clinic by self.  History of present illness: She reports having neuropathy since 1990s and was seeing Dr. Erling Cruz at [facility] since 2008. Symptoms started in her right foot which gradually involved left foot and over the past 2-3 years also involves her fingers bilaterally. Symptoms are described as numbness, burning, and stinging sensation. Numbness is at the level of her mid-calf now, which is constant. She was taking gabapentin 100mg  at bedtime which helped her sleep but did not provide any relief. She stopped taking this over a year ago. She was briefly taking Lyrica, but did not continue due to side effects. Previously evaluation has included normal serum protein electrophoresis, borderline high TSH, normal RA, ACE , ANA, sedimentation rate, and vitamin B12. NCS/EMG was also done, but results are unknown.   No history of diabetes, alcohol use, or chemotherapy. Her sister also has neuropathy.  She denies any problems with her memory and able to take care of own finances and driving without any safety concerns. She has a strong family history of Alzheimer's disease.  UPDATE 09/07/2015:   She reports having a congestion for three weeks in December and then developed imbalance.  She did not have vertigo.  She was seeing ENT for these symptoms who diagnosed her with vestibular neuritis. She began having spells of feeling lightheaded in January and presented to the Emergency Department after experiencing four presyncopal events.  Spells are triggered by head movement. MRI of brain not reveal intracranial mass lesion, but  there was an indeterminate 3 mm lesion at the tip of the dens and recommended a short interval follow-up study in 3 months given history of melanoma. She is here to discuss this.  UPDATE 12/17/2015:  Repeat MRI brain shows stable lesion of the dens, most like due to arthritis. She complains of numbness in the fingers and cramps of the hands.  Her neuropathy has been worse the past few days, describes it a 1000 bee stings. She is currently not taking any medications.  She previously tried gabapentin but did not notice a significant changes. She endorses having short-term memory problems with names and appointments. She lives alone and does all her IADLs and ADLs.  She drives and has not been involved in any MVAs.  She denies getting lost.    UPDATE 04/27/2016:  She called for sooner appointment due to increased frequency of visual auras.  She has a long history of migraine, occurring once every month preceded by visual aura, but recently she began having visual aura occurring twice daily for the past 6 weeks, but it never transitioned into a painful migraine.  The visual symptoms involve both eyes and last about 20 minutes.  She is unable to take NSAIDs due to GI irritation, but fortunately, had not needed to take anything for pain, because symptoms are only visual.  The aura can be bothersome if they occur when she is driving or at a meeting and she has had to pull to the side of the road.   She saw her eye doctor who suggested it may also be related to retinopathy, but she only has this in  one eye.  UPDATE 10/31/2016:    She is here for 6 month appointment.  She has noticed less frequent visual auras, occurring about once per week lasting about 20-30 minutes.  Her lightheadedness has also improved.  She US carotids which showed < 50% stenosis bilaterally and started on atorvastatin 10mg . She had not had any interval falls or hospitalizations. She continues to have mild issues with short-term memory, but has not  wanted to pursue neuropsychological testing. Overall, she is feeling well.  Today, she complains of new right abdominal pain, just below her rib cage which is triggered by trunk flexion, such as when wearing shoes. Pain is brief and resolved with assuming upright position.  She has not mentioned these symptoms to her PCP.  There is no burning/numbness of this region.     Medications:  Current Outpatient Prescriptions on File Prior to Visit  Medication Sig Dispense Refill  . albuterol (PROVENTIL HFA;VENTOLIN HFA) 108 (90 BASE) MCG/ACT inhaler Inhale 1-2 puffs into the lungs every 6 (six) hours as needed for wheezing or shortness of breath.    . Ascorbic Acid (VITAMIN C) 1000 MG tablet Take 1,000 mg by mouth daily.    Marland Kitchen aspirin EC 81 MG tablet Take 81 mg by mouth daily.    Marland Kitchen atorvastatin (LIPITOR) 10 MG tablet Take 1 tablet by mouth daily.    . Biotin 5000 MCG CAPS Take 5,000 mcg by mouth daily.    Marland Kitchen CALCIUM PO Take 500 mg by mouth daily.    . Cholecalciferol (VITAMIN D3) 2000 UNITS capsule Take 2,000 Units by mouth daily.    . fluconazole (DIFLUCAN) 200 MG tablet Take 1 tablet (200 mg total) by mouth as directed. Take 2 tablets (400 mg total) by mouth on day one, then 1 tablet daily until finished. 14 tablet 0  . levothyroxine (SYNTHROID) 50 MCG tablet Take 1 tablet (50 mcg total) by mouth daily before breakfast. 30 tablet 1  . MAGNESIUM PO Take by mouth daily.    . Multiple Vitamin (MULTIVITAMIN WITH MINERALS) TABS tablet Take 1 tablet by mouth daily.    . Omega-3 Fatty Acids (FISH OIL) 1200 MG CAPS Take 1,200 mg by mouth 3 (three) times daily.    Marland Kitchen omeprazole (PRILOSEC) 20 MG capsule Take 1 capsule (20 mg total) by mouth daily. 90 capsule 3   Current Facility-Administered Medications on File Prior to Visit  Medication Dose Route Frequency Provider Last Rate Last Dose  . 0.9 %  sodium chloride infusion  500 mL Intravenous Continuous Manus Gunning, MD        Allergies:  Allergies    Allergen Reactions  . Epinephrine Other (See Comments)    Heart palpitations  . Penicillins Swelling    Lip swelling Has patient had a PCN reaction causing immediate rash, facial/tongue/throat swelling, SOB or lightheadedness with hypotension: Yes Has patient had a PCN reaction causing severe rash involving mucus membranes or skin necrosis: No Has patient had a PCN reaction that required hospitalization No Has patient had a PCN reaction occurring within the last 10 years: No If all of the above answers are "NO", then may proceed with Cephalosporin use.  . Valium [Diazepam] Other (See Comments)    sedation    Review of Systems:  CONSTITUTIONAL: No fevers, chills, night sweats, or weight loss.  EYES: +visual changes or eye pain ENT: No hearing changes.  No history of nose bleeds.   RESPIRATORY: No cough, wheezing and shortness of breath.   CARDIOVASCULAR:  Negative for chest pain, and palpitations.   GI: Negative for abdominal discomfort, blood in stools or black stools.  No recent change in bowel habits.   GU:  No history of incontinence.   MUSCLOSKELETAL: No history of joint pain or swelling.  No myalgias.   SKIN: Negative for lesions, rash, and itching.   ENDOCRINE: Negative for cold or heat intolerance, polydipsia or goiter.   PSYCH:  No depression or anxiety symptoms.   NEURO: As Above.   Vital Signs:  BP 120/70   Pulse (!) 122   Ht 5' 6.5" (1.689 m)   Wt 185 lb 1 oz (83.9 kg)   SpO2 92%   BMI 29.42 kg/m   Neurological Exam: MENTAL STATUS including orientation to time, place, person, recent and remote memory, attention span and concentration, language, and fund of knowledge is normal.  Speech is not dysarthric.  CRANIAL NERVES:   Face is symmetric. Palate elevates symmetrically.  Tongue is midline.  MOTOR:  Motor strength is 5/5 in all extremities    MSRs:  Reflexes are 2+/4 throughout, except 1+ at the Achilles bilaterally.  SENSORY:  Vibration diminished distal to  knees bilaterally.  COORDINATION/GAIT:  Gait is unsteady at times, unassisted.  Data: Peripheral vascular studies 02/13/2015: Normal ABI  MRI brain and cervical spine 11/10/2014: No acute infarct. Remote right frontal lobe infarct with encephalomalacia. Prominent small vessel disease type changes representing significant progression since prior exam. No intracranial mass lesion noted on this unenhanced exam. Cervical spondylotic changes upper cervical spine most prominent C4-5 level.  MRI brain 08/07/2015: 1. No intracranial mass lesion or abnormal enhancement. 2. Previously noted indeterminate 3 mm lesion at the tip of the dens demonstrates homogeneous postcontrast enhancement. Given the location and morphology of this lesion, this is favored to be degenerative in in nature. However, given the history of melanoma, a short interval follow-up study in 3 months is recommended to document stability to ensure no underlying metastatic lesion is present.  MRI brain wwo contrast 10/19/2015: Negative for metastatic disease to the brain. No acute infarct. Moderate to advanced chronic microvascular ischemia. Large joint effusion of the right TMJ. This is unchanged and may be related to degenerative change or rheumatoid arthritis 3 mm enhancing lesion the dens is stable and most likely related to arthritis. Question rheumatoid arthritis Negative for metastatic disease to the brain.  NCS/EMG of the upper extremities 01/05/2016: 1. Chronic C6, C7, and C8 radiculopathy affecting right upper extremity, mild in degree electrically. 2. Chronic C8 radiculopathy affecting the left upper extremity, mild in degree electrically. 3. Incidentally, there is a Martin-Gruber anastomosis bilaterally, a normal variant.  US carotids 05/12/2016:  Less than 50% stenosis in the right and left internal carotid arteries.  IMPRESSION/PLAN: 1.  Ocular migraine - improved  - MRI brain imaging shows small enhancement of the  dens, which is stable and most likely reflects arthritis.   2.  Bilateral hand paresthesias due to multilevel cervical radiculopathy as noted on EMG.  No evidence of CTS.  Clinically, stable.  3.  Mild cognitive impairment - stable.  She is highly independent but having some difficulty with episodic memory.    - Neuropsychological evaluation declined  4.  Idiopathic peripheral neuropathy, diagnosed 1990s, manifesting predominately with paresthesias following a glove-stocking distribution - stable.  Encouraged to wear flat shoes as to minimize sensory ataxia and falls.   5.  Right abdominal pain with flexion, follow-up with PCP.  Reassured patient that this does not appear to be  neurological in origin  Return to clinic in 6 months  The duration of this appointment visit was 20 minutes of face-to-face time with the patient.  Greater than 50% of this time was spent in counseling, explanation of diagnosis, planning of further management, and coordination of care.   Thank you for allowing me to participate in patient's care.  If I can answer any additional questions, I would be pleased to do so.    Sincerely,    Neeya Prigmore K. Posey Pronto, DO

## 2016-12-06 DIAGNOSIS — L570 Actinic keratosis: Secondary | ICD-10-CM | POA: Diagnosis not present

## 2016-12-06 DIAGNOSIS — L304 Erythema intertrigo: Secondary | ICD-10-CM | POA: Diagnosis not present

## 2016-12-14 DIAGNOSIS — C4359 Malignant melanoma of other part of trunk: Secondary | ICD-10-CM | POA: Diagnosis not present

## 2016-12-15 ENCOUNTER — Other Ambulatory Visit: Payer: Self-pay | Admitting: General Surgery

## 2016-12-15 DIAGNOSIS — C4359 Malignant melanoma of other part of trunk: Secondary | ICD-10-CM

## 2016-12-19 ENCOUNTER — Ambulatory Visit
Admission: RE | Admit: 2016-12-19 | Discharge: 2016-12-19 | Disposition: A | Discharge status: Home / Self Care | Payer: PPO | Source: Ambulatory Visit | Attending: General Surgery | Admitting: General Surgery

## 2016-12-19 DIAGNOSIS — R197 Diarrhea, unspecified: Secondary | ICD-10-CM | POA: Diagnosis not present

## 2016-12-19 DIAGNOSIS — C4359 Malignant melanoma of other part of trunk: Secondary | ICD-10-CM

## 2016-12-19 DIAGNOSIS — K449 Diaphragmatic hernia without obstruction or gangrene: Secondary | ICD-10-CM | POA: Diagnosis not present

## 2016-12-19 MED ORDER — IOPAMIDOL (ISOVUE-300) INJECTION 61%
100.0000 mL | Freq: Once | INTRAVENOUS | Status: AC | PRN
  Administered 2016-12-19: 100 mL via INTRAVENOUS

## 2017-01-02 DIAGNOSIS — T451X5A Adverse effect of antineoplastic and immunosuppressive drugs, initial encounter: Secondary | ICD-10-CM | POA: Diagnosis not present

## 2017-02-22 DIAGNOSIS — Z872 Personal history of diseases of the skin and subcutaneous tissue: Secondary | ICD-10-CM | POA: Diagnosis not present

## 2017-02-22 DIAGNOSIS — L821 Other seborrheic keratosis: Secondary | ICD-10-CM | POA: Diagnosis not present

## 2017-03-12 IMAGING — US US CAROTID DUPLEX BILAT
1 series · 13 of 24 positions shown · non-contrast
Comparison: None.

CLINICAL DATA: Syncope

EXAM:
BILATERAL CAROTID DUPLEX ULTRASOUND
TECHNIQUE: Gray scale imaging, color Doppler and duplex ultrasound were
performed of bilateral carotid and vertebral arteries in the neck.

[Series 1: us carotid duplex bilat · 0.06mm/px · 13 of 58 slices shown]
[im 1/58]
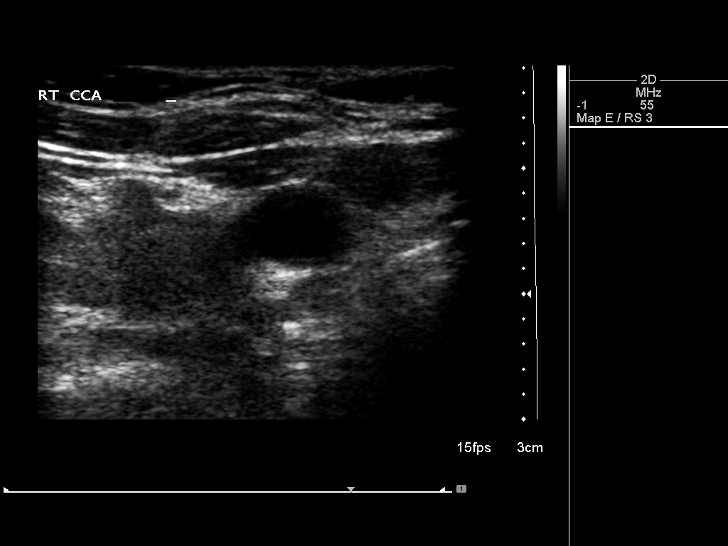
[im 5/58]
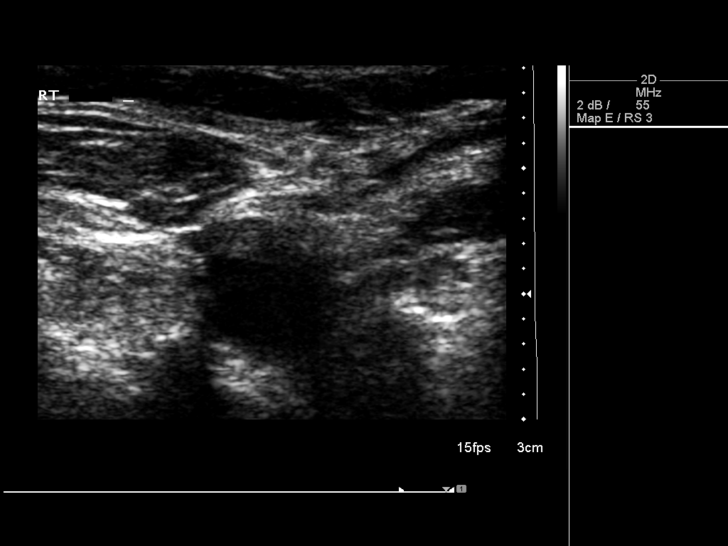
[im 10/58]
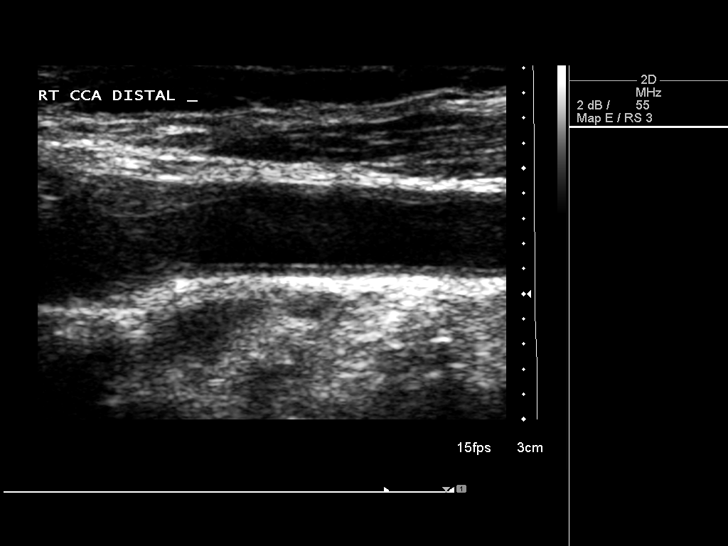
[im 15/58]
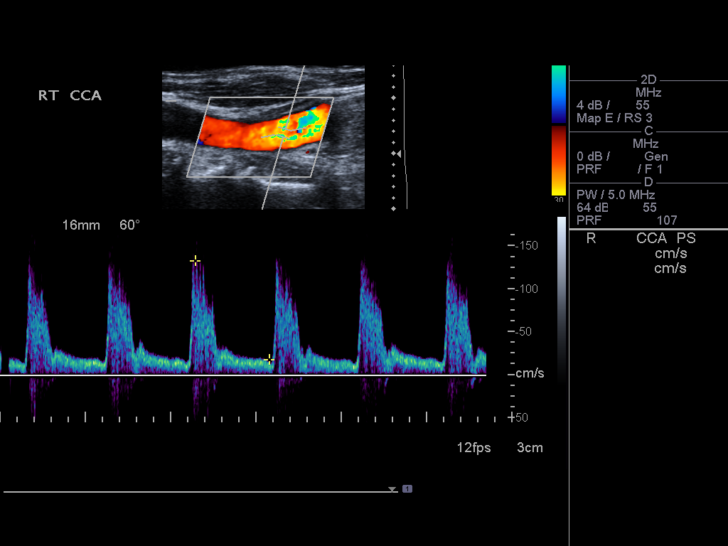
[im 20/58]
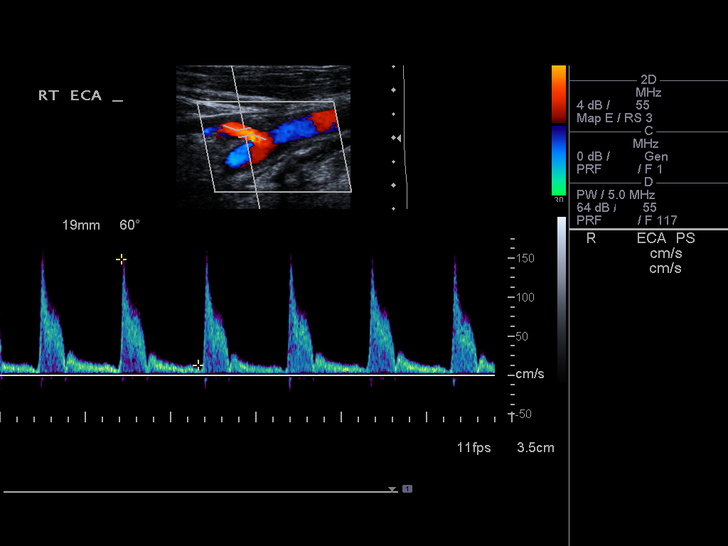
[im 25/58]
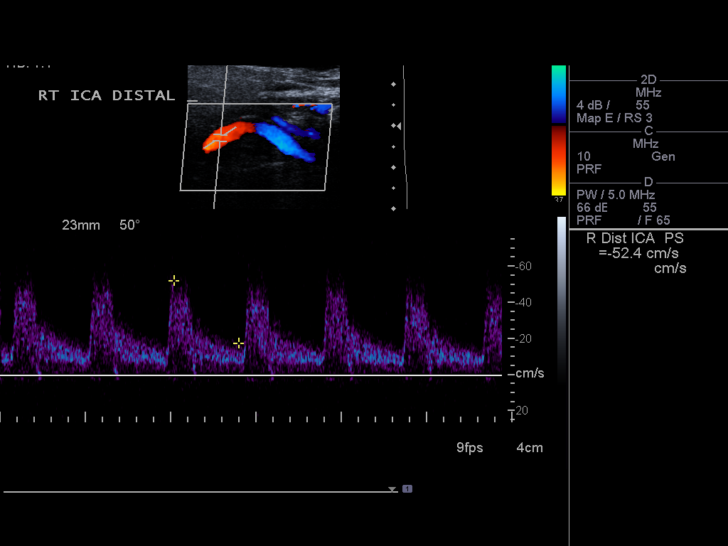
[im 30/58]
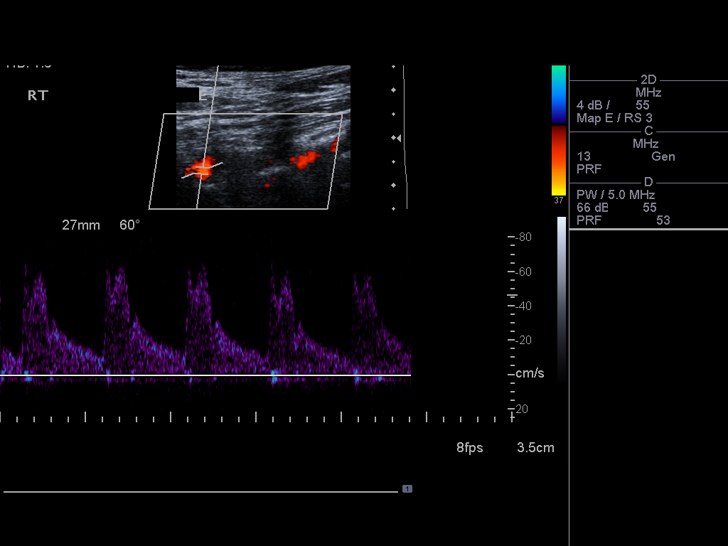
[im 33/58]
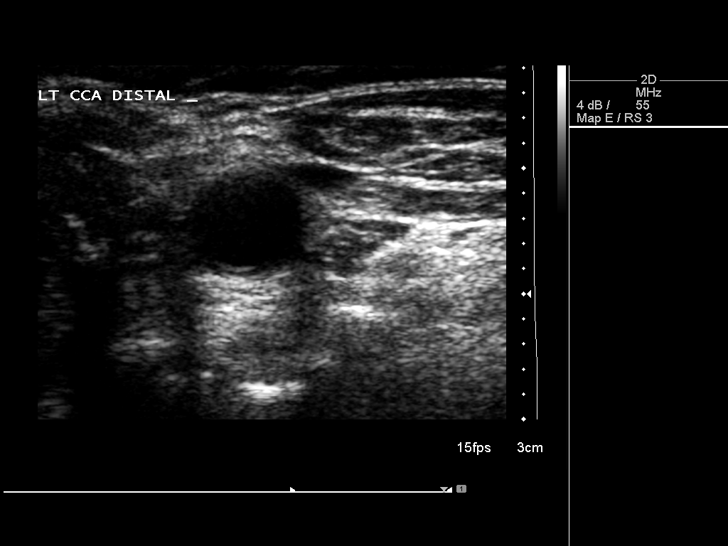
[im 38/58]
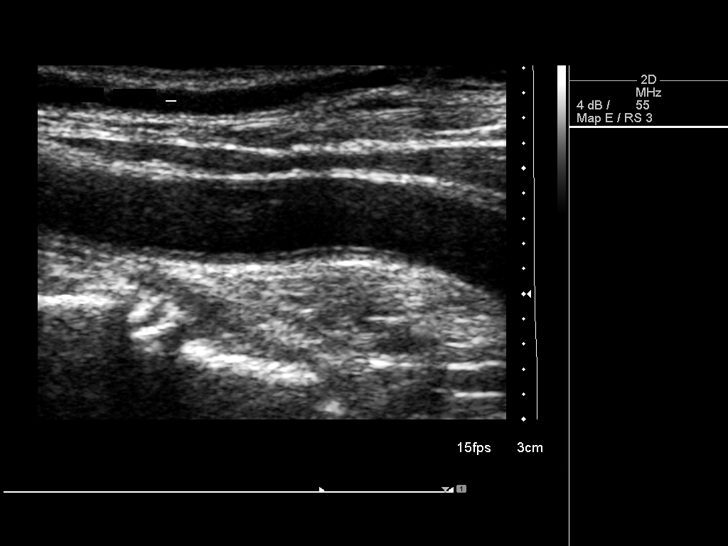
[im 43/58]
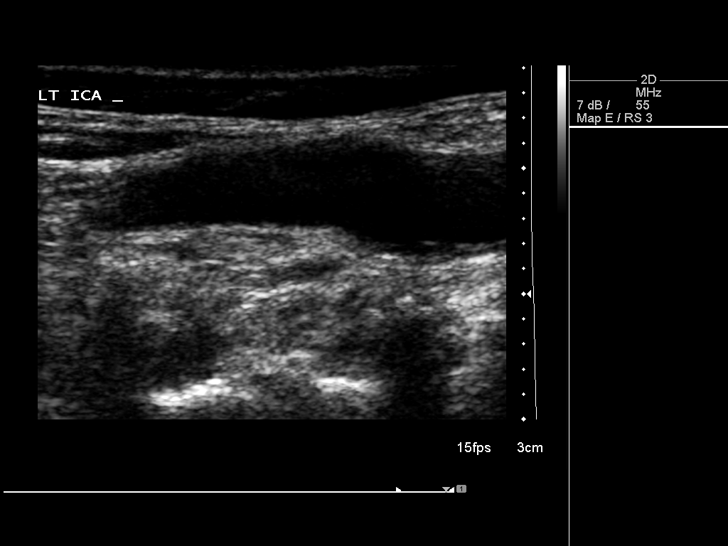
[im 48/58]
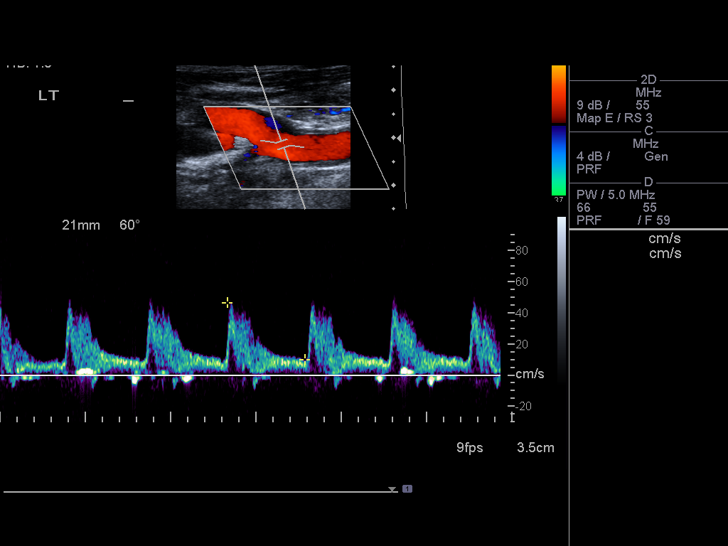
[im 53/58]
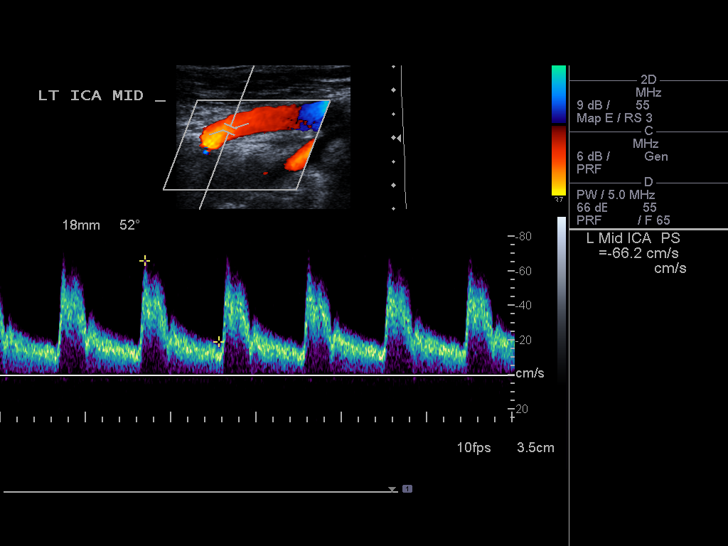
[im 58/58]
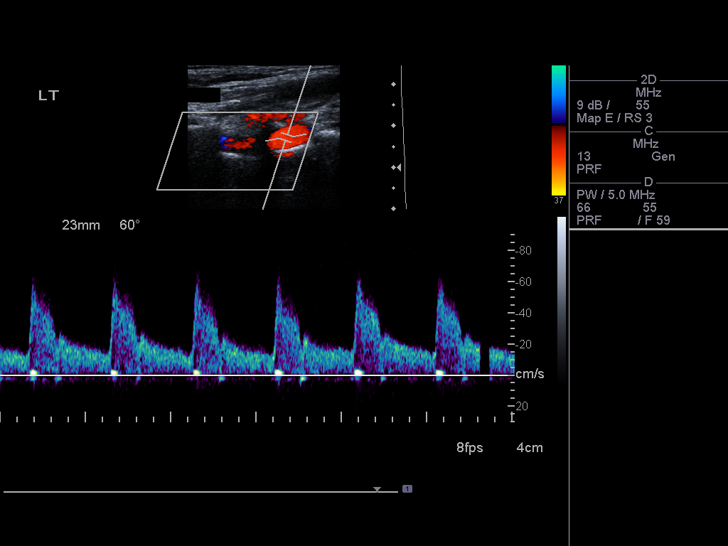

[13 of 24 positions shown; findings below may reference images not displayed]

FINDINGS: Criteria: Quantification of carotid stenosis is based on velocity
parameters that correlate the residual internal carotid diameter
with NASCET-based stenosis levels, using the diameter of the distal
internal carotid lumen as the denominator for stenosis measurement.

The following velocity measurements were obtained:

RIGHT

ICA:  108 cm/sec

CCA:  133 cm/sec

SYSTOLIC ICA/CCA RATIO:

DIASTOLIC ICA/CCA RATIO:

ECA:  149 cm/sec

LEFT

ICA:  73 cm/sec

CCA:  102 cm/sec

SYSTOLIC ICA/CCA RATIO:

DIASTOLIC ICA/CCA RATIO:

ECA:  97 cm/sec

RIGHT CAROTID ARTERY: Minimal soft smooth plaque in the bulb. Low
resistance internal carotid Doppler pattern. Internal carotid is
moderately tortuous.

RIGHT VERTEBRAL ARTERY:  Antegrade.

LEFT CAROTID ARTERY: Mild smooth soft plaque in the bulb. Low
resistance internal carotid Doppler pattern. Moderate tortuosity of
the internal carotid.

LEFT VERTEBRAL ARTERY:  Antegrade.
IMPRESSION: Less than 50% stenosis in the right and left internal carotid
arteries.

## 2017-03-29 DIAGNOSIS — M1712 Unilateral primary osteoarthritis, left knee: Secondary | ICD-10-CM | POA: Diagnosis not present

## 2017-03-29 DIAGNOSIS — M25562 Pain in left knee: Secondary | ICD-10-CM | POA: Diagnosis not present

## 2017-04-09 DIAGNOSIS — I6523 Occlusion and stenosis of bilateral carotid arteries: Secondary | ICD-10-CM | POA: Insufficient documentation

## 2017-04-09 DIAGNOSIS — I2510 Atherosclerotic heart disease of native coronary artery without angina pectoris: Secondary | ICD-10-CM | POA: Insufficient documentation

## 2017-04-09 DIAGNOSIS — E785 Hyperlipidemia, unspecified: Secondary | ICD-10-CM | POA: Insufficient documentation

## 2017-04-09 DIAGNOSIS — E782 Mixed hyperlipidemia: Secondary | ICD-10-CM | POA: Insufficient documentation

## 2017-04-09 NOTE — Progress Notes (Signed)
Cardiology Office Note  Date:  04/11/2017   ID:  [name], DOB [birth year], MRN [id number]  PCP:  Birdie Sons, MD (Inactive)   Chief Complaint  Patient presents with  . other    12 month follow up. Patient c/o SOB. Former patient of Dr Yvone Neu patient. Meds reviewed evrbally with patient.     HPI:  [name] is a 76 y.o. Female with hx of  CAD and aortic athero on CT scan  lightheadedness,  Mild carotid dz Hyperlipidemia morbid obesity Syncope S/p right TKR Who presents for preoperative evaluation for knee surgery and coronary disease  Surgery for TKR left scheduled in the near future On anxiety pill  Continues to have vestibular issues, lightheadedness, balance problems Previously seen by ENT, completed vestibular physical therapy  No regular exercise program No shortness of breath or chest pain on exertion  Previous imaging and studies below reviewed with her in detail on today's visit Stress test 04/2016: no ischemia EF >55%  Carotid u/s <39% b/l  CT chest 12/2016 Images pulled up in the office and reviewed with her Very mild Aortic and branch vessel atherosclerosis. Mild cardiomegaly, without pericardial effusion. LAD coronary artery atherosclerosis.   Nonsmoker No diabetes  Total chol 159, LDL 75  EKG personally reviewed by myself on todays visit  shows Normal sinus rhythm with rate 58 bpm no significant ST or T-wave changes   PMH:   has a past medical history of Acid reflux; Anemia; Anxiety; Arthritis; Atypical moles; Bronchitis; CAD (coronary artery disease); Carotid artery calcification, bilateral (04/2016); Complication of anesthesia; Depression; Dizziness; Fatigue; Generalized headache; GERD (gastroesophageal reflux disease); History of chemotherapy; Hypothyroidism; Insomnia; Irritable bowel syndrome; LLQ abdominal pain; Melanoma ([place]) (2013); Melanoma ([place]); Neuropathy, peripheral; Retina disorder; Thyroid disease; and Weight  gain.  PSH:    Past Surgical History:  Procedure Laterality Date  . CHOLECYSTECTOMY N/A 01/28/2015   Procedure: LAPAROSCOPIC CHOLECYSTECTOMY WITH INTRAOPERATIVE CHOLANGIOGRAM;  Surgeon: Autumn Messing III, MD;  Location: [place];  Service: General;  Laterality: N/A;  . CLOSED MANIPULATION SHOULDER     rt  . COLONOSCOPY WITH PROPOFOL N/A 11/20/2012   Procedure: COLONOSCOPY WITH PROPOFOL;  Surgeon: Garlan Fair, MD;  Location: [facility];  Service: Endoscopy;  Laterality: N/A;  . colonscopy     May 2014  . JOINT REPLACEMENT     RTK  . KNEE ARTHROSCOPY    . left knee miniscus tear repair    . MELANOMA EXCISION WITH SENTINEL LYMPH NODE BIOPSY  03/18/11   Back, nodes neg  . ROTATOR CUFF REPAIR     left  . SHOULDER SURGERY     right  . TOTAL KNEE ARTHROPLASTY     right  . TUMOR REMOVAL     ovary    Current Outpatient Prescriptions  Medication Sig Dispense Refill  . albuterol (PROVENTIL HFA;VENTOLIN HFA) 108 (90 BASE) MCG/ACT inhaler Inhale 1-2 puffs into the lungs every 6 (six) hours as needed for wheezing or shortness of breath.    . Ascorbic Acid (VITAMIN C) 1000 MG tablet Take 1,000 mg by mouth daily.    Marland Kitchen aspirin EC 81 MG tablet Take 81 mg by mouth daily.    Marland Kitchen atorvastatin (LIPITOR) 10 MG tablet Take 1 tablet by mouth daily.    . Biotin 5000 MCG CAPS Take 5,000 mcg by mouth daily.    Marland Kitchen CALCIUM PO Take 500 mg by mouth daily.    . Cholecalciferol (VITAMIN D3) 2000 UNITS capsule Take 2,000 Units  by mouth daily.    Marland Kitchen levothyroxine (SYNTHROID) 50 MCG tablet Take 1 tablet (50 mcg total) by mouth daily before breakfast. 30 tablet 1  . MAGNESIUM PO Take by mouth daily.    . Multiple Vitamin (MULTIVITAMIN WITH MINERALS) TABS tablet Take 1 tablet by mouth daily.    . Omega-3 Fatty Acids (FISH OIL) 1200 MG CAPS Take 1,200 mg by mouth 3 (three) times daily.    Marland Kitchen omeprazole (PRILOSEC) 20 MG capsule Take 1 capsule (20 mg total) by mouth daily. 90 capsule 3   Current Facility-Administered  Medications  Medication Dose Route Frequency Provider Last Rate Last Dose  . 0.9 %  sodium chloride infusion  500 mL Intravenous Continuous Armbruster, Carlota Raspberry, MD         Allergies:   Epinephrine; Penicillins; and Valium [diazepam]   Social History:  The patient  reports that she has never smoked. She has never used smokeless tobacco. She reports that she drinks about 0.6 oz of alcohol per week . She reports that she does not use drugs.   Family History:   family history includes Alzheimer's disease in her paternal aunt, paternal aunt, and paternal aunt; Colon cancer in her maternal grandfather; Colon polyps in her sister; Diabetes in her father and sister; Heart disease in her brother, father, and sister; Hyperlipidemia in her father; Hypertension in her father and mother; Pancreatic cancer in her maternal uncle; Stroke in her mother.    Review of Systems: Review of Systems  Constitutional: Negative.   Respiratory: Negative.   Cardiovascular: Negative.   Gastrointestinal: Negative.   Musculoskeletal: Negative.   Neurological: Positive for dizziness.  Psychiatric/Behavioral: Negative.   All other systems reviewed and are negative.    PHYSICAL EXAM: VS:  BP 135/81 (BP Location: Left Arm, Patient Position: Sitting, Cuff Size: Normal)   Pulse (!) 58   Ht 5\' 7"  (1.702 m)   Wt 186 lb (84.4 kg)   BMI 29.13 kg/m  , BMI Body mass index is 29.13 kg/m. GEN: Well nourished, well developed, in no acute distress , obese HEENT: normal  Neck: no JVD, carotid bruits, or masses Cardiac: RRR; no murmurs, rubs, or gallops,no edema  Respiratory:  clear to auscultation bilaterally, normal work of breathing GI: soft, nontender, nondistended, + BS MS: no deformity or atrophy  Skin: warm and dry, no rash Neuro:  Strength and sensation are intact Psych: euthymic mood, full affect    Recent Labs: No results found for requested labs within last 8760 hours.    Lipid Panel Lab Results   Component Value Date   CHOL 181 12/22/2014   HDL 57 12/22/2014   LDLCALC 108 (H) 12/22/2014   TRIG 79 12/22/2014      Wt Readings from Last 3 Encounters:  04/11/17 186 lb (84.4 kg)  10/31/16 185 lb 1 oz (83.9 kg)  10/06/16 187 lb 4 oz (84.9 kg)       ASSESSMENT AND PLAN:  Coronary artery calcification seen on CAT scan - Plan: EKG 12-Lead Currently with no symptoms of angina. No further workup at this time. Continue current medication regimen. mild calcification noted in the LAD  Aortic atherosclerosis (HCC) - Plan: EKG 12-Lead Very mild aortic atherosclerosis in the descending aorta  Syncope, unspecified syncope type No near syncope or syncope Long history of vestibular issues, gait instability Previously seen by ear nose throat  Carotid stenosis, asymptomatic, bilateral Minimal carotid plaque, less than 39% laterally No further workup needed  Mixed hyperlipidemia Mild diffuse  plaquing in coronary, carotid, aorta Stressed importance of weight loss and taking her cholesterol medication Goal LDL less than 70 If numbers do not trend down, may need to increase Lipitor up to 20  Disposition:   F/U  12 months as needed   Total encounter time more than 25 minutes  Greater than 50% was spent in counseling and coordination of care with the patient    Orders Placed This Encounter  Procedures  . EKG 12-Lead     Signed, Esmond Plants, M.D., Ph.D. 04/11/2017  [place], [place]

## 2017-04-11 ENCOUNTER — Ambulatory Visit (INDEPENDENT_AMBULATORY_CARE_PROVIDER_SITE_OTHER): Payer: PPO | Admitting: Cardiovascular Disease

## 2017-04-11 ENCOUNTER — Encounter: Payer: Self-pay | Admitting: Cardiovascular Disease

## 2017-04-11 VITALS — BP 135/81 | HR 58 | Ht 67.0 in | Wt 186.0 lb

## 2017-04-11 DIAGNOSIS — R55 Syncope and collapse: Secondary | ICD-10-CM | POA: Diagnosis not present

## 2017-04-11 DIAGNOSIS — I700 Atherosclerosis of aorta: Secondary | ICD-10-CM

## 2017-04-11 DIAGNOSIS — I6523 Occlusion and stenosis of bilateral carotid arteries: Secondary | ICD-10-CM | POA: Diagnosis not present

## 2017-04-11 DIAGNOSIS — I2510 Atherosclerotic heart disease of native coronary artery without angina pectoris: Secondary | ICD-10-CM

## 2017-04-11 DIAGNOSIS — E782 Mixed hyperlipidemia: Secondary | ICD-10-CM | POA: Diagnosis not present

## 2017-04-11 NOTE — Patient Instructions (Addendum)

## 2017-04-17 ENCOUNTER — Telehealth: Payer: Self-pay | Admitting: Cardiovascular Disease

## 2017-04-17 NOTE — Telephone Encounter (Signed)
Pt reports Dr. Rockey Situ told her there was a staple in her abdomen most likely from gall bladder surgery two years ago. States it was seen on some old scans. She would like more details so she can discuss with her surgeon. As I am unable to locate this information, will route to Dr. Rockey Situ for clarification.

## 2017-04-17 NOTE — Telephone Encounter (Signed)
Patient was told by Dr. Rockey Situ there was a stable seen in her body when viewing one of her images.      She would like more details so she can talk to her Surgeons office.  Please call.

## 2017-04-18 NOTE — Telephone Encounter (Signed)
I am not a surgeon but I often see metal clip (not staple) on CT scan after gall bladder removal. I see this frequently , that is how I was able to guess that gall bladder was out. Characteristic sign, not uncommon. Likely standard practice.

## 2017-04-18 NOTE — Telephone Encounter (Signed)
Patient states that she was told there was a staple left in her body and she wanted the verbiage that Dr. Rockey Situ used so that when she goes back to her next appointment she can ask them about it. Let her know that it is not uncommon and she repeatedly requested to check with Dr. Rockey Situ so she can just make sure she knows what it is. Let her know that I would send this over to him and I would give her a call back. She was very appreciative for the call.

## 2017-04-18 NOTE — Telephone Encounter (Signed)
Left voicemail message to call back  

## 2017-04-19 DIAGNOSIS — Z0000 Encounter for general adult medical examination without abnormal findings: Secondary | ICD-10-CM | POA: Diagnosis not present

## 2017-04-19 DIAGNOSIS — I2584 Coronary atherosclerosis due to calcified coronary lesion: Secondary | ICD-10-CM | POA: Diagnosis not present

## 2017-04-19 DIAGNOSIS — F411 Generalized anxiety disorder: Secondary | ICD-10-CM | POA: Diagnosis not present

## 2017-04-19 DIAGNOSIS — G609 Hereditary and idiopathic neuropathy, unspecified: Secondary | ICD-10-CM | POA: Diagnosis not present

## 2017-04-19 DIAGNOSIS — J309 Allergic rhinitis, unspecified: Secondary | ICD-10-CM | POA: Diagnosis not present

## 2017-04-19 DIAGNOSIS — K219 Gastro-esophageal reflux disease without esophagitis: Secondary | ICD-10-CM | POA: Diagnosis not present

## 2017-04-19 DIAGNOSIS — E039 Hypothyroidism, unspecified: Secondary | ICD-10-CM | POA: Diagnosis not present

## 2017-04-19 DIAGNOSIS — Z1389 Encounter for screening for other disorder: Secondary | ICD-10-CM | POA: Diagnosis not present

## 2017-04-19 DIAGNOSIS — I6523 Occlusion and stenosis of bilateral carotid arteries: Secondary | ICD-10-CM | POA: Diagnosis not present

## 2017-04-19 DIAGNOSIS — Z1231 Encounter for screening mammogram for malignant neoplasm of breast: Secondary | ICD-10-CM | POA: Diagnosis not present

## 2017-04-19 DIAGNOSIS — E785 Hyperlipidemia, unspecified: Secondary | ICD-10-CM | POA: Diagnosis not present

## 2017-04-19 NOTE — Telephone Encounter (Signed)
Reviewed Dr. Donivan Scull information regarding clip and how common this is with surgeries. She was very appreciative that I went back to clarify this information for her. She verbalized understanding with no further questions at this time.

## 2017-05-03 NOTE — H&P (Signed)
TOTAL KNEE ADMISSION H&P  Patient is being admitted for left total knee arthroplasty.  Subjective:  Chief Complaint:   Left knee primary OA / pain  HPI: [name], 76 y.o. female, has a history of pain and functional disability in the left knee due to arthritis and has failed non-surgical conservative treatments for greater than 12 weeks to include NSAID's and/or analgesics, corticosteriod injections, viscosupplementation injections and activity modification.  Onset of symptoms was gradual, starting 3+ years ago with gradually worsening course since that time. The patient noted prior procedures on the knee to include  arthroscopy and menisectomy on the left knee(s).  Patient currently rates pain in the left knee(s) at 4 out of 10 with activity. Patient has night pain, worsening of pain with activity and weight bearing, pain that interferes with activities of daily living, pain with passive range of motion, crepitus and joint swelling.  Patient has evidence of periarticular osteophytes and joint space narrowing by imaging studies.  There is no active infection.  Risks, benefits and expectations were discussed with the patient.  Risks including but not limited to the risk of anesthesia, blood clots, nerve damage, blood vessel damage, failure of the prosthesis, infection and up to and including death.  Patient understand the risks, benefits and expectations and wishes to proceed with surgery.   PCP: Birdie Sons, MD (Inactive)  D/C Plans:       SNF  Post-op Meds:       No Rx given  Tranexamic Acid:      To be given - IV   Decadron:      IOs to be given  FYI:     ASA  Norco  DME:   Rx given for - RW and 3-n-1  PT:   SNF   Patient Active Problem List   Diagnosis Date Noted  . Coronary artery calcification seen on CAT scan 04/09/2017  . Carotid stenosis, asymptomatic, bilateral 04/09/2017  . Mixed hyperlipidemia 04/09/2017  . MCI (mild cognitive impairment) 12/17/2015  .  Urinary frequency 08/09/2015  . Hypothyroidism 08/09/2015  . Syncope 08/08/2015  . Posterior vitreous detachment 07/23/2015  . Hereditary and idiopathic peripheral neuropathy 05/11/2015  . Acute pancreatitis 12/21/2014  . Gallstones 12/21/2014  . Abdominal pain 12/21/2014  . Acute bronchitis due to infection 07/04/2014  . Cellophane retinopathy 04/12/2012  . Cataract, nuclear 04/12/2012  . Left lower quadrant pain 08/16/2011  . Breast pain in female 07/18/2011  . Melanoma of back ([place]) 02/28/2011   Past Medical History:  Diagnosis Date  . Acid reflux   . Anemia    3 years ago  . Anxiety    in past  . Arthritis    all over  . Atypical moles   . Bronchitis   . CAD (coronary artery disease)   . Carotid artery calcification, bilateral 04/2016  . Complication of anesthesia    shoulder surgery- not all the way asleep  . Depression    in past  . Dizziness   . Fatigue   . Generalized headache    migraines  . GERD (gastroesophageal reflux disease)   . History of chemotherapy    topical cream for h/o skin CA  . Hypothyroidism   . Insomnia   . Irritable bowel syndrome   . LLQ abdominal pain   . Melanoma ([place]) 2013  . Melanoma ([place])    x2 stg II melanoma  . Neuropathy, peripheral   . Retina disorder    left  . Thyroid  disease   . Weight gain     Past Surgical History:  Procedure Laterality Date  . CHOLECYSTECTOMY N/A 01/28/2015   Procedure: LAPAROSCOPIC CHOLECYSTECTOMY WITH INTRAOPERATIVE CHOLANGIOGRAM;  Surgeon: Autumn Messing III, MD;  Location: [facility];  Service: General;  Laterality: N/A;  . CLOSED MANIPULATION SHOULDER     rt  . COLONOSCOPY WITH PROPOFOL N/A 11/20/2012   Procedure: COLONOSCOPY WITH PROPOFOL;  Surgeon: Garlan Fair, MD;  Location: [facility];  Service: Endoscopy;  Laterality: N/A;  . colonscopy     May 2014  . JOINT REPLACEMENT     RTK  . KNEE ARTHROSCOPY    . left knee miniscus tear repair    . MELANOMA EXCISION WITH SENTINEL LYMPH NODE BIOPSY   03/18/11   Back, nodes neg  . ROTATOR CUFF REPAIR     left  . SHOULDER SURGERY     right  . TOTAL KNEE ARTHROPLASTY     right  . TUMOR REMOVAL     ovary    Current Facility-Administered Medications  Medication Dose Route Frequency Provider Last Rate Last Dose  . 0.9 %  sodium chloride infusion  500 mL Intravenous Continuous Armbruster, Carlota Raspberry, MD       Current Outpatient Prescriptions  Medication Sig Dispense Refill Last Dose  . albuterol (PROVENTIL HFA;VENTOLIN HFA) 108 (90 BASE) MCG/ACT inhaler Inhale 1-2 puffs into the lungs every 6 (six) hours as needed for wheezing or shortness of breath.   Taking  . Ascorbic Acid (VITAMIN C) 1000 MG tablet Take 1,000 mg by mouth daily.   Taking  . aspirin EC 81 MG tablet Take 81 mg by mouth daily.   Taking  . atorvastatin (LIPITOR) 10 MG tablet Take 1 tablet by mouth daily.   Taking  . Biotin 5000 MCG CAPS Take 5,000 mcg by mouth daily.   Taking  . CALCIUM PO Take 500 mg by mouth daily.   Taking  . Cholecalciferol (VITAMIN D3) 2000 UNITS capsule Take 2,000 Units by mouth daily.   Taking  . levothyroxine (SYNTHROID) 50 MCG tablet Take 1 tablet (50 mcg total) by mouth daily before breakfast. 30 tablet 1 Taking  . MAGNESIUM PO Take by mouth daily.   Taking  . Multiple Vitamin (MULTIVITAMIN WITH MINERALS) TABS tablet Take 1 tablet by mouth daily.   Taking  . Omega-3 Fatty Acids (FISH OIL) 1200 MG CAPS Take 1,200 mg by mouth 3 (three) times daily.   Taking  . omeprazole (PRILOSEC) 20 MG capsule Take 1 capsule (20 mg total) by mouth daily. 90 capsule 3 Taking   Allergies  Allergen Reactions  . Epinephrine Other (See Comments)    Heart palpitations  . Penicillins Swelling    Lip swelling Has patient had a PCN reaction causing immediate rash, facial/tongue/throat swelling, SOB or lightheadedness with hypotension: Yes Has patient had a PCN reaction causing severe rash involving mucus membranes or skin necrosis: No Has patient had a PCN reaction  that required hospitalization No Has patient had a PCN reaction occurring within the last 10 years: No If all of the above answers are "NO", then may proceed with Cephalosporin use.  . Valium [Diazepam] Other (See Comments)    sedation    Social History  Substance Use Topics  . Smoking status: Never Smoker  . Smokeless tobacco: Never Used  . Alcohol use 0.6 oz/week    1 Cans of beer per week     Comment: once a week beer or wine  Family History  Problem Relation Age of Onset  . Hypertension Mother   . Stroke Mother   . Heart disease Father   . Diabetes Father   . Hypertension Father   . Hyperlipidemia Father   . Diabetes Sister   . Heart disease Sister        heart murmur  . Heart disease Brother   . Pancreatic cancer Maternal Uncle   . Alzheimer's disease Paternal Aunt   . Colon cancer Maternal Grandfather   . Alzheimer's disease Paternal Aunt   . Alzheimer's disease Paternal Aunt   . Colon polyps Sister        adenomous     Review of Systems  Constitutional: Positive for malaise/fatigue.  HENT: Negative.        Vertigo issues  Eyes: Negative.   Respiratory: Positive for shortness of breath (with exertion).   Cardiovascular: Negative.   Gastrointestinal: Positive for heartburn.  Genitourinary: Positive for frequency and urgency.  Musculoskeletal: Positive for joint pain.  Skin: Negative.   Neurological: Positive for headaches.  Endo/Heme/Allergies: Negative.   Psychiatric/Behavioral: Positive for depression.    Objective:  Physical Exam  Constitutional: She is oriented to person, place, and time. She appears well-developed.  HENT:  Head: Normocephalic.  Eyes: Pupils are equal, round, and reactive to light.  Neck: Neck supple. No JVD present. No tracheal deviation present. No thyromegaly present.  Cardiovascular: Normal rate, regular rhythm and intact distal pulses.   Respiratory: Effort normal and breath sounds normal. No respiratory distress. She has no  wheezes.  GI: Soft. There is no tenderness. There is no guarding.  Musculoskeletal:       Left knee: She exhibits decreased range of motion, swelling and bony tenderness. She exhibits no ecchymosis, no deformity, no laceration and no erythema. Tenderness found.  Lymphadenopathy:    She has no cervical adenopathy.  Neurological: She is alert and oriented to person, place, and time. A sensory deficit (neuropathy in bilateral feet and hands) is present.  Skin: Skin is warm and dry.  Psychiatric: She has a normal mood and affect.      Labs:  Estimated body mass index is 29.13 kg/m as calculated from the following:   Height as of 04/11/17: 5\' 7"  (1.702 m).   Weight as of 04/11/17: 84.4 kg (186 lb).   Imaging Review Plain radiographs demonstrate severe degenerative joint disease of the left knee(s). The bone quality appears to be good for age and reported activity level.  Assessment/Plan:  End stage arthritis, left knee   The patient history, physical examination, clinical judgment of the provider and imaging studies are consistent with end stage degenerative joint disease of the left knee(s) and total knee arthroplasty is deemed medically necessary. The treatment options including medical management, injection therapy arthroscopy and arthroplasty were discussed at length. The risks and benefits of total knee arthroplasty were presented and reviewed. The risks due to aseptic loosening, infection, stiffness, patella tracking problems, thromboembolic complications and other imponderables were discussed. The patient acknowledged the explanation, agreed to proceed with the plan and consent was signed. Patient is being admitted for inpatient treatment for surgery, pain control, PT, OT, prophylactic antibiotics, VTE prophylaxis, progressive ambulation and ADL's and discharge planning. The patient is planning to be discharged to skilled nursing facility.     [place] Pugh Mason Burleigh   PA-C  05/03/2017,  10:58 AM

## 2017-05-05 NOTE — Pre-Procedure Instructions (Signed)
PCP clearance on chart EKG 04/11/17 in epic Last Cardiology office visit Rockey Situ) 04/11/17 in epic CT abdomen, chest, pelvis 12/19/16 in epic

## 2017-05-05 NOTE — Pre-Procedure Instructions (Signed)
Echo 05/11/16 in epic

## 2017-05-05 NOTE — Patient Instructions (Signed)
[name]  05/05/2017   Your procedure is scheduled on:   Tuesday, Oct. 30, 2018   Report to [facility] Main  Entrance    Report to admitting at 7:30 AM   Call this number if you have problems the morning of surgery [phone]   Remember: ONLY 1 PERSON MAY GO WITH YOU TO SHORT STAY TO GET  READY MORNING OF [place].   Do not eat food or drink liquids :After Midnight.     Take these medicines the morning of surgery with A SIP OF WATER: Levothyroxine (Synthroid), Omeprazole (Prilosec), Sertraline (Zoloft)   Bring Asthma Inhaler day of surgery and use per normal routine                               You may not have any metal on your body including hair pins and              piercings    Do not wear jewelry, make-up, lotions, powders or perfumes, deodorant             Do not wear nail polish.  Do not shave  48 hours prior to surgery.                Do not bring valuables to the hospital. [place].   Contacts, dentures or bridgework may not be worn into surgery.   Leave suitcase in the car. After surgery it may be brought to your room.              Please read over the following fact sheets you were given: _____________________________________________________________________             [facility] - Preparing for Surgery Before surgery, you can play an important role.  Because skin is not sterile, your skin needs to be as free of germs as possible.  You can reduce the number of germs on your skin by washing with CHG (chlorahexidine gluconate) soap before surgery.  CHG is an antiseptic cleaner which kills germs and bonds with the skin to continue killing germs even after washing. Please DO NOT use if you have an allergy to CHG or antibacterial soaps.  If your skin becomes reddened/irritated stop using the CHG and inform your nurse when you arrive at Short Stay. Do not shave (including legs and  underarms) for at least 48 hours prior to the first CHG shower.  You may shave your face/neck. Please follow these instructions carefully:  1.  Shower with CHG Soap the night before surgery and the  morning of Surgery.  2.  If you choose to wash your hair, wash your hair first as usual with your  normal  shampoo.  3.  After you shampoo, rinse your hair and body thoroughly to remove the  shampoo.                             4.  Use CHG as you would any other liquid soap.  You can apply chg directly  to the skin and wash  Gently with a scrungie or clean washcloth.  5.  Apply the CHG Soap to your body ONLY FROM THE NECK DOWN.   Do not use on face/ open                           Wound or open sores. Avoid contact with eyes, ears mouth and genitals (private parts).                       Wash face,  Genitals (private parts) with your normal soap.             6.  Wash thoroughly, paying special attention to the area where your surgery  will be performed.  7.  Thoroughly rinse your body with warm water from the neck down.  8.  DO NOT shower/wash with your normal soap after using and rinsing off  the CHG Soap.                9.  Pat yourself dry with a clean towel.            10.  Wear clean pajamas.            11.  Place clean sheets on your bed the night of your first shower and do not  sleep with pets.  Day of Surgery : Do not apply any lotions/deodorants the morning of surgery.  Please wear clean clothes to the hospital/surgery center.  FAILURE TO FOLLOW THESE INSTRUCTIONS MAY RESULT IN THE CANCELLATION OF YOUR SURGERY PATIENT SIGNATURE_________________________________  NURSE SIGNATURE__________________________________  ________________________________________________________________________   Adam Phenix  An incentive spirometer is a tool that can help keep your lungs clear and active. This tool measures how well you are filling your lungs with each breath.  Taking long deep breaths may help reverse or decrease the chance of developing breathing (pulmonary) problems (especially infection) following:  A long period of time when you are unable to move or be active. BEFORE THE PROCEDURE   If the spirometer includes an indicator to show your best effort, your nurse or respiratory therapist will set it to a desired goal.  If possible, sit up straight or lean slightly forward. Try not to slouch.  Hold the incentive spirometer in an upright position. INSTRUCTIONS FOR USE  1. Sit on the edge of your bed if possible, or sit up as far as you can in bed or on a chair. 2. Hold the incentive spirometer in an upright position. 3. Breathe out normally. 4. Place the mouthpiece in your mouth and seal your lips tightly around it. 5. Breathe in slowly and as deeply as possible, raising the piston or the ball toward the top of the column. 6. Hold your breath for 3-5 seconds or for as long as possible. Allow the piston or ball to fall to the bottom of the column. 7. Remove the mouthpiece from your mouth and breathe out normally. 8. Rest for a few seconds and repeat Steps 1 through 7 at least 10 times every 1-2 hours when you are awake. Take your time and take a few normal breaths between deep breaths. 9. The spirometer may include an indicator to show your best effort. Use the indicator as a goal to work toward during each repetition. 10. After each set of 10 deep breaths, practice coughing to be sure your lungs are clear. If you have an incision (the cut made at the time of  surgery), support your incision when coughing by placing a pillow or rolled up towels firmly against it. Once you are able to get out of bed, walk around indoors and cough well. You may stop using the incentive spirometer when instructed by your caregiver.  RISKS AND COMPLICATIONS  Take your time so you do not get dizzy or light-headed.  If you are in pain, you may need to take or ask for pain  medication before doing incentive spirometry. It is harder to take a deep breath if you are having pain. AFTER USE  Rest and breathe slowly and easily.  It can be helpful to keep track of a log of your progress. Your caregiver can provide you with a simple table to help with this. If you are using the spirometer at home, follow these instructions: [place] IF:   You are having difficultly using the spirometer.  You have trouble using the spirometer as often as instructed.  Your pain medication is not giving enough relief while using the spirometer.  You develop fever of 100.5 F (38.1 C) or higher. SEEK IMMEDIATE MEDICAL CARE IF:   You cough up bloody sputum that had not been present before.  You develop fever of 102 F (38.9 C) or greater.  You develop worsening pain at or near the incision site. MAKE SURE YOU:   Understand these instructions.  Will watch your condition.  Will get help right away if you are not doing well or get worse. Document Released: 11/14/2006 Document Revised: 09/26/2011 Document Reviewed: 01/15/2007 Exit[employer] Patient Information 2014 Exit[employer], [place].   ________________________________________________________________________  WHAT IS A BLOOD TRANSFUSION? Blood Transfusion Information  A transfusion is the replacement of blood or some of its parts. Blood is made up of multiple cells which provide different functions.  Red blood cells carry oxygen and are used for blood loss replacement.  White blood cells fight against infection.  Platelets control bleeding.  Plasma helps clot blood.  Other blood products are available for specialized needs, such as hemophilia or other clotting disorders. BEFORE THE TRANSFUSION  Who gives blood for transfusions?   Healthy volunteers who are fully evaluated to make sure their blood is safe. This is blood bank blood. Transfusion therapy is the safest it has ever been in the practice of medicine.  Before blood is taken from a donor, a complete history is taken to make sure that person has no history of diseases nor engages in risky social behavior (examples are intravenous drug use or sexual activity with multiple partners). The donor's travel history is screened to minimize risk of transmitting infections, such as malaria. The donated blood is tested for signs of infectious diseases, such as HIV and hepatitis. The blood is then tested to be sure it is compatible with you in order to minimize the chance of a transfusion reaction. If you or a relative donates blood, this is often done in anticipation of surgery and is not appropriate for emergency situations. It takes many days to process the donated blood. RISKS AND COMPLICATIONS Although transfusion therapy is very safe and saves many lives, the main dangers of transfusion include:   Getting an infectious disease.  Developing a transfusion reaction. This is an allergic reaction to something in the blood you were given. Every precaution is taken to prevent this. The decision to have a blood transfusion has been considered carefully by your caregiver before blood is given. Blood is not given unless the benefits outweigh the risks. AFTER THE  TRANSFUSION  Right after receiving a blood transfusion, you will usually feel much better and more energetic. This is especially true if your red blood cells have gotten low (anemic). The transfusion raises the level of the red blood cells which carry oxygen, and this usually causes an energy increase.  The nurse administering the transfusion will monitor you carefully for complications. HOME CARE INSTRUCTIONS  No special instructions are needed after a transfusion. You may find your energy is better. Speak with your caregiver about any limitations on activity for underlying diseases you may have. SEEK MEDICAL CARE IF:   Your condition is not improving after your transfusion.  You develop redness or  irritation at the intravenous (IV) site. SEEK IMMEDIATE MEDICAL CARE IF:  Any of the following symptoms occur over the next 12 hours:  Shaking chills.  You have a temperature by mouth above 102 F (38.9 C), not controlled by medicine.  Chest, back, or muscle pain.  People around you feel you are not acting correctly or are confused.  Shortness of breath or difficulty breathing.  Dizziness and fainting.  You get a rash or develop hives.  You have a decrease in urine output.  Your urine turns a dark color or changes to pink, red, or brown. Any of the following symptoms occur over the next 10 days:  You have a temperature by mouth above 102 F (38.9 C), not controlled by medicine.  Shortness of breath.  Weakness after normal activity.  The white part of the eye turns yellow (jaundice).  You have a decrease in the amount of urine or are urinating less often.  Your urine turns a dark color or changes to pink, red, or brown. Document Released: 07/01/2000 Document Revised: 09/26/2011 Document Reviewed: 02/18/2008 [facility] Patient Information 2014 [place], [place].  _______________________________________________________________________

## 2017-05-08 ENCOUNTER — Encounter: Payer: Self-pay | Admitting: Podiatry

## 2017-05-08 ENCOUNTER — Ambulatory Visit (INDEPENDENT_AMBULATORY_CARE_PROVIDER_SITE_OTHER): Payer: PPO | Admitting: Podiatry

## 2017-05-08 DIAGNOSIS — M79676 Pain in unspecified toe(s): Secondary | ICD-10-CM | POA: Diagnosis not present

## 2017-05-08 DIAGNOSIS — B351 Tinea unguium: Secondary | ICD-10-CM

## 2017-05-08 DIAGNOSIS — N393 Stress incontinence (female) (male): Secondary | ICD-10-CM | POA: Insufficient documentation

## 2017-05-08 DIAGNOSIS — R102 Pelvic and perineal pain: Secondary | ICD-10-CM | POA: Insufficient documentation

## 2017-05-09 DIAGNOSIS — M1712 Unilateral primary osteoarthritis, left knee: Secondary | ICD-10-CM | POA: Diagnosis not present

## 2017-05-10 ENCOUNTER — Encounter (HOSPITAL_COMMUNITY)
Admission: RE | Admit: 2017-05-10 | Discharge: 2017-05-10 | Disposition: A | Discharge status: Home / Self Care | Payer: PPO | Source: Ambulatory Visit | Attending: Orthopedic Surgery | Admitting: Orthopedic Surgery

## 2017-05-10 ENCOUNTER — Encounter (HOSPITAL_COMMUNITY): Payer: Self-pay

## 2017-05-10 DIAGNOSIS — Z01818 Encounter for other preprocedural examination: Secondary | ICD-10-CM | POA: Insufficient documentation

## 2017-05-10 DIAGNOSIS — M1712 Unilateral primary osteoarthritis, left knee: Secondary | ICD-10-CM | POA: Insufficient documentation

## 2017-05-10 HISTORY — DX: Prediabetes: R73.03

## 2017-05-10 HISTORY — DX: Cerebral infarction, unspecified: I63.9

## 2017-05-10 HISTORY — DX: Nausea with vomiting, unspecified: R11.2

## 2017-05-10 HISTORY — DX: Personal history of urinary calculi: Z87.442

## 2017-05-10 HISTORY — DX: Pneumonia, unspecified organism: J18.9

## 2017-05-10 HISTORY — DX: Other specified postprocedural states: Z98.890

## 2017-05-10 HISTORY — DX: Family history of other specified conditions: Z84.89

## 2017-05-10 LAB — BASIC METABOLIC PANEL
Anion gap: 8 (ref 5–15)
BUN: 21 mg/dL — ABNORMAL HIGH (ref 6–20)
CO2: 27 mmol/L (ref 22–32)
Calcium: 8.9 mg/dL (ref 8.9–10.3)
Chloride: 105 mmol/L (ref 101–111)
Creatinine, Ser: 0.79 mg/dL (ref 0.44–1.00)
GFR calc Af Amer: >60 mL/min (ref >60)
GFR calc non Af Amer: >60 mL/min (ref >60)
Glucose, Bld: 97 mg/dL (ref 65–99)
Potassium: 4.8 mmol/L (ref 3.5–5.1)
Sodium: 140 mmol/L (ref 135–145)

## 2017-05-10 LAB — CBC
HCT: 42.3 % (ref 36.0–46.0)
Hemoglobin: 14 g/dL (ref 12.0–15.0)
MCH: 30.2 pg (ref 26.0–34.0)
MCHC: 33.1 g/dL (ref 30.0–36.0)
MCV: 91.2 fL (ref 78.0–100.0)
Platelets: 200 10*3/uL (ref 150–400)
RBC: 4.64 MIL/uL (ref 3.87–5.11)
RDW: 13.4 % (ref 11.5–15.5)
WBC: 6.5 10*3/uL (ref 4.0–10.5)

## 2017-05-10 LAB — HEMOGLOBIN A1C
Hgb A1c MFr Bld: 5.9 % — ABNORMAL HIGH (ref 4.8–5.6)
Mean Plasma Glucose: 122.63 mg/dL

## 2017-05-10 LAB — SURGICAL PCR SCREEN
MRSA, PCR: NEGATIVE
Staphylococcus aureus: NEGATIVE

## 2017-05-10 NOTE — Progress Notes (Signed)
[place]- dr Fara Olden- on chart  04/11/17- LOV- dr Rockey Situ- cardiology- on chart with clearance in note.  ekg-04/11/17-epic  Echo-05/11/16-epic

## 2017-05-10 NOTE — Progress Notes (Signed)
She presents today chief complaint of painful elongated toenails. She states she is getting ready to have a knee surgery and she would like to have her nails look a little better.  Objective: Vital signs are stable alert and oriented 3 pulses are palpable. Toenails are long thick yellow dystrophic clinically mycotic painful palpation as well as debridement.  Assessment: Pain in limb secondary to onychomycosis.  Plan: Debridement of toenails 1 through 5 bilateral.

## 2017-05-16 ENCOUNTER — Inpatient Hospital Stay (HOSPITAL_COMMUNITY): Payer: PPO | Admitting: Anesthesiology

## 2017-05-16 ENCOUNTER — Encounter (HOSPITAL_COMMUNITY)
Admission: RE | Disposition: A | Discharge status: Skilled Nursing Facility | Payer: Self-pay | Source: Ambulatory Visit | Attending: Orthopedic Surgery

## 2017-05-16 ENCOUNTER — Inpatient Hospital Stay (HOSPITAL_COMMUNITY)
Admission: RE | Admit: 2017-05-16 | Discharge: 2017-05-18 | DRG: 470 | Disposition: A | Discharge status: Skilled Nursing Facility | Payer: PPO | Source: Ambulatory Visit | Attending: Orthopedic Surgery | Admitting: Orthopedic Surgery

## 2017-05-16 ENCOUNTER — Encounter (HOSPITAL_COMMUNITY): Payer: Self-pay | Admitting: *Deleted

## 2017-05-16 DIAGNOSIS — Z888 Allergy status to other drugs, medicaments and biological substances status: Secondary | ICD-10-CM

## 2017-05-16 DIAGNOSIS — Z471 Aftercare following joint replacement surgery: Secondary | ICD-10-CM | POA: Diagnosis not present

## 2017-05-16 DIAGNOSIS — I2510 Atherosclerotic heart disease of native coronary artery without angina pectoris: Secondary | ICD-10-CM | POA: Diagnosis present

## 2017-05-16 DIAGNOSIS — F329 Major depressive disorder, single episode, unspecified: Secondary | ICD-10-CM | POA: Diagnosis present

## 2017-05-16 DIAGNOSIS — M1712 Unilateral primary osteoarthritis, left knee: Secondary | ICD-10-CM | POA: Diagnosis present

## 2017-05-16 DIAGNOSIS — E039 Hypothyroidism, unspecified: Secondary | ICD-10-CM | POA: Diagnosis present

## 2017-05-16 DIAGNOSIS — K219 Gastro-esophageal reflux disease without esophagitis: Secondary | ICD-10-CM | POA: Diagnosis present

## 2017-05-16 DIAGNOSIS — G8911 Acute pain due to trauma: Secondary | ICD-10-CM | POA: Diagnosis not present

## 2017-05-16 DIAGNOSIS — Z96652 Presence of left artificial knee joint: Secondary | ICD-10-CM

## 2017-05-16 DIAGNOSIS — G8918 Other acute postprocedural pain: Secondary | ICD-10-CM | POA: Diagnosis not present

## 2017-05-16 DIAGNOSIS — E782 Mixed hyperlipidemia: Secondary | ICD-10-CM | POA: Diagnosis present

## 2017-05-16 DIAGNOSIS — Z96651 Presence of right artificial knee joint: Secondary | ICD-10-CM | POA: Diagnosis present

## 2017-05-16 DIAGNOSIS — K8020 Calculus of gallbladder without cholecystitis without obstruction: Secondary | ICD-10-CM | POA: Diagnosis not present

## 2017-05-16 DIAGNOSIS — M1990 Unspecified osteoarthritis, unspecified site: Secondary | ICD-10-CM | POA: Diagnosis not present

## 2017-05-16 DIAGNOSIS — Z880 Allergy status to penicillin: Secondary | ICD-10-CM

## 2017-05-16 DIAGNOSIS — R262 Difficulty in walking, not elsewhere classified: Secondary | ICD-10-CM | POA: Diagnosis not present

## 2017-05-16 DIAGNOSIS — M6281 Muscle weakness (generalized): Secondary | ICD-10-CM | POA: Diagnosis not present

## 2017-05-16 DIAGNOSIS — Z79899 Other long term (current) drug therapy: Secondary | ICD-10-CM | POA: Diagnosis not present

## 2017-05-16 DIAGNOSIS — H43811 Vitreous degeneration, right eye: Secondary | ICD-10-CM | POA: Diagnosis not present

## 2017-05-16 DIAGNOSIS — E785 Hyperlipidemia, unspecified: Secondary | ICD-10-CM | POA: Diagnosis not present

## 2017-05-16 DIAGNOSIS — Z885 Allergy status to narcotic agent status: Secondary | ICD-10-CM

## 2017-05-16 DIAGNOSIS — Z8673 Personal history of transient ischemic attack (TIA), and cerebral infarction without residual deficits: Secondary | ICD-10-CM | POA: Diagnosis not present

## 2017-05-16 DIAGNOSIS — Z96659 Presence of unspecified artificial knee joint: Secondary | ICD-10-CM

## 2017-05-16 DIAGNOSIS — D649 Anemia, unspecified: Secondary | ICD-10-CM | POA: Diagnosis not present

## 2017-05-16 DIAGNOSIS — M25562 Pain in left knee: Secondary | ICD-10-CM | POA: Diagnosis not present

## 2017-05-16 DIAGNOSIS — Z7982 Long term (current) use of aspirin: Secondary | ICD-10-CM

## 2017-05-16 HISTORY — PX: TOTAL KNEE ARTHROPLASTY: SHX125

## 2017-05-16 LAB — TYPE AND SCREEN
ABO/RH(D): B POS
Antibody Screen: NEGATIVE

## 2017-05-16 SURGERY — ARTHROPLASTY, KNEE, TOTAL
Anesthesia: Spinal | Site: Knee | Laterality: Left

## 2017-05-16 MED ORDER — MIDAZOLAM HCL 2 MG/2ML IJ SOLN
INTRAMUSCULAR | Status: AC
  Administered 2017-05-16: 2 mg via INTRAVENOUS
  Filled 2017-05-16: qty 2

## 2017-05-16 MED ORDER — HYDROCODONE-ACETAMINOPHEN 7.5-325 MG PO TABS: 1.0000 | ORAL_TABLET | ORAL | 0 refills | Status: DC | PRN

## 2017-05-16 MED ORDER — ALBUTEROL SULFATE (2.5 MG/3ML) 0.083% IN NEBU
3.0000 mL | INHALATION_SOLUTION | Freq: Four times a day (QID) | RESPIRATORY_TRACT | Status: DC | PRN
Start: 2017-05-16 — End: 2017-05-18

## 2017-05-16 MED ORDER — PANTOPRAZOLE SODIUM 40 MG PO TBEC
40.0000 mg | DELAYED_RELEASE_TABLET | Freq: Every day | ORAL | Status: DC
  Administered 2017-05-16 – 2017-05-18 (×3): 40 mg via ORAL
  Filled 2017-05-16 (×3): qty 1

## 2017-05-16 MED ORDER — MIDAZOLAM HCL 2 MG/2ML IJ SOLN
1.0000 mg | INTRAMUSCULAR | Status: DC | PRN
  Administered 2017-05-16: 2 mg via INTRAVENOUS

## 2017-05-16 MED ORDER — MEPERIDINE HCL 50 MG/ML IJ SOLN: 6.2500 mg | INTRAMUSCULAR | Status: DC | PRN

## 2017-05-16 MED ORDER — FERROUS SULFATE 325 (65 FE) MG PO TABS
325.0000 mg | ORAL_TABLET | Freq: Three times a day (TID) | ORAL | Status: DC
  Administered 2017-05-16 – 2017-05-18 (×5): 325 mg via ORAL
  Filled 2017-05-16 (×5): qty 1

## 2017-05-16 MED ORDER — VANCOMYCIN HCL IN DEXTROSE 1-5 GM/200ML-% IV SOLN
INTRAVENOUS | Status: AC
  Filled 2017-05-16: qty 200

## 2017-05-16 MED ORDER — ONDANSETRON HCL 4 MG/2ML IJ SOLN
INTRAMUSCULAR | Status: DC | PRN
  Administered 2017-05-16: 4 mg via INTRAVENOUS

## 2017-05-16 MED ORDER — PROMETHAZINE HCL 25 MG/ML IJ SOLN: 6.2500 mg | INTRAMUSCULAR | Status: DC | PRN

## 2017-05-16 MED ORDER — ONDANSETRON HCL 4 MG PO TABS
4.0000 mg | ORAL_TABLET | Freq: Four times a day (QID) | ORAL | Status: DC | PRN
  Administered 2017-05-18: 4 mg via ORAL
  Filled 2017-05-16: qty 1

## 2017-05-16 MED ORDER — PROPOFOL 10 MG/ML IV BOLUS
INTRAVENOUS | Status: AC
  Filled 2017-05-16: qty 40

## 2017-05-16 MED ORDER — HYDROMORPHONE HCL 1 MG/ML IJ SOLN
0.5000 mg | INTRAMUSCULAR | Status: DC | PRN
  Administered 2017-05-16: 1 mg via INTRAVENOUS
  Administered 2017-05-17 (×2): 0.5 mg via INTRAVENOUS
  Administered 2017-05-18: 1 mg via INTRAVENOUS
  Filled 2017-05-16 (×4): qty 1

## 2017-05-16 MED ORDER — SODIUM CHLORIDE 0.9 % IV SOLN
INTRAVENOUS | Status: DC
  Administered 2017-05-16 – 2017-05-17 (×2): via INTRAVENOUS

## 2017-05-16 MED ORDER — METHOCARBAMOL 500 MG PO TABS: 500.0000 mg | ORAL_TABLET | Freq: Four times a day (QID) | ORAL | 0 refills | Status: DC | PRN

## 2017-05-16 MED ORDER — SODIUM CHLORIDE 0.9 % IV SOLN
1000.0000 mg | INTRAVENOUS | Status: AC
  Administered 2017-05-16: 1000 mg via INTRAVENOUS
  Filled 2017-05-16: qty 1100

## 2017-05-16 MED ORDER — FENTANYL CITRATE (PF) 100 MCG/2ML IJ SOLN
50.0000 ug | INTRAMUSCULAR | Status: DC | PRN
  Administered 2017-05-16: 50 ug via INTRAVENOUS

## 2017-05-16 MED ORDER — FENTANYL CITRATE (PF) 100 MCG/2ML IJ SOLN
INTRAMUSCULAR | Status: AC
  Administered 2017-05-16: 50 ug via INTRAVENOUS
  Filled 2017-05-16: qty 2

## 2017-05-16 MED ORDER — SODIUM CHLORIDE 0.9 % IJ SOLN
INTRAMUSCULAR | Status: AC
  Filled 2017-05-16: qty 50

## 2017-05-16 MED ORDER — DEXTROSE 5 % IV SOLN
INTRAVENOUS | Status: DC | PRN
  Administered 2017-05-16: 20 ug/min via INTRAVENOUS

## 2017-05-16 MED ORDER — LACTATED RINGERS IV SOLN: INTRAVENOUS | Status: DC

## 2017-05-16 MED ORDER — ASPIRIN 81 MG PO CHEW: 81.0000 mg | CHEWABLE_TABLET | Freq: Two times a day (BID) | ORAL | 0 refills | Status: DC

## 2017-05-16 MED ORDER — BUPIVACAINE HCL (PF) 0.25 % IJ SOLN
INTRAMUSCULAR | Status: AC
  Filled 2017-05-16: qty 30

## 2017-05-16 MED ORDER — DEXAMETHASONE SODIUM PHOSPHATE 10 MG/ML IJ SOLN
10.0000 mg | Freq: Once | INTRAMUSCULAR | Status: AC
  Administered 2017-05-17: 10 mg via INTRAVENOUS
  Filled 2017-05-16: qty 1

## 2017-05-16 MED ORDER — ATORVASTATIN CALCIUM 10 MG PO TABS
10.0000 mg | ORAL_TABLET | Freq: Every day | ORAL | Status: DC
  Administered 2017-05-17: 10 mg via ORAL
  Filled 2017-05-16 (×2): qty 1

## 2017-05-16 MED ORDER — METOCLOPRAMIDE HCL 5 MG PO TABS: 5.0000 mg | ORAL_TABLET | Freq: Three times a day (TID) | ORAL | Status: DC | PRN

## 2017-05-16 MED ORDER — DOCUSATE SODIUM 100 MG PO CAPS
100.0000 mg | ORAL_CAPSULE | Freq: Two times a day (BID) | ORAL | Status: DC
  Administered 2017-05-16 – 2017-05-18 (×4): 100 mg via ORAL
  Filled 2017-05-16 (×4): qty 1

## 2017-05-16 MED ORDER — KETOROLAC TROMETHAMINE 30 MG/ML IJ SOLN
INTRAMUSCULAR | Status: DC | PRN
  Administered 2017-05-16: 30 mg via INTRAVENOUS

## 2017-05-16 MED ORDER — ASPIRIN 81 MG PO CHEW
81.0000 mg | CHEWABLE_TABLET | Freq: Two times a day (BID) | ORAL | Status: DC
  Administered 2017-05-16 – 2017-05-18 (×4): 81 mg via ORAL
  Filled 2017-05-16 (×4): qty 1

## 2017-05-16 MED ORDER — BISACODYL 10 MG RE SUPP: 10.0000 mg | Freq: Every day | RECTAL | Status: DC | PRN

## 2017-05-16 MED ORDER — BUPIVACAINE-EPINEPHRINE (PF) 0.25% -1:200000 IJ SOLN
INTRAMUSCULAR | Status: AC
  Filled 2017-05-16: qty 30

## 2017-05-16 MED ORDER — DM-GUAIFENESIN ER 30-600 MG PO TB12
1.0000 | ORAL_TABLET | Freq: Two times a day (BID) | ORAL | Status: DC
  Administered 2017-05-16 – 2017-05-18 (×5): 1 via ORAL
  Filled 2017-05-16 (×5): qty 1

## 2017-05-16 MED ORDER — TRANEXAMIC ACID 1000 MG/10ML IV SOLN
1000.0000 mg | Freq: Once | INTRAVENOUS | Status: AC
  Administered 2017-05-16: 1000 mg via INTRAVENOUS
  Filled 2017-05-16: qty 1100

## 2017-05-16 MED ORDER — SODIUM CHLORIDE 0.9 % IJ SOLN
INTRAMUSCULAR | Status: DC | PRN
  Administered 2017-05-16: 60 mL via INTRAVENOUS

## 2017-05-16 MED ORDER — BUPIVACAINE-EPINEPHRINE (PF) 0.25% -1:200000 IJ SOLN
INTRAMUSCULAR | Status: DC | PRN
  Administered 2017-05-16: 30 mL via PERINEURAL

## 2017-05-16 MED ORDER — ROPIVACAINE HCL 5 MG/ML IJ SOLN
INTRAMUSCULAR | Status: DC | PRN
  Administered 2017-05-16: 30 mL via PERINEURAL

## 2017-05-16 MED ORDER — CELECOXIB 200 MG PO CAPS
200.0000 mg | ORAL_CAPSULE | Freq: Two times a day (BID) | ORAL | Status: DC
  Administered 2017-05-16 – 2017-05-18 (×4): 200 mg via ORAL
  Filled 2017-05-16 (×4): qty 1

## 2017-05-16 MED ORDER — DOCUSATE SODIUM 100 MG PO CAPS: 100.0000 mg | ORAL_CAPSULE | Freq: Two times a day (BID) | ORAL | 0 refills | Status: DC

## 2017-05-16 MED ORDER — FERROUS SULFATE 325 (65 FE) MG PO TABS: 325.0000 mg | ORAL_TABLET | Freq: Three times a day (TID) | ORAL | 3 refills | Status: DC

## 2017-05-16 MED ORDER — ACETAMINOPHEN 325 MG PO TABS: 650.0000 mg | ORAL_TABLET | ORAL | Status: DC | PRN

## 2017-05-16 MED ORDER — ACETAMINOPHEN 650 MG RE SUPP: 650.0000 mg | RECTAL | Status: DC | PRN

## 2017-05-16 MED ORDER — CHLORHEXIDINE GLUCONATE 4 % EX LIQD: 60.0000 mL | Freq: Once | CUTANEOUS | Status: DC

## 2017-05-16 MED ORDER — HYDROCODONE-ACETAMINOPHEN 7.5-325 MG PO TABS
2.0000 | ORAL_TABLET | ORAL | Status: DC | PRN
  Administered 2017-05-16 – 2017-05-18 (×6): 2 via ORAL
  Filled 2017-05-16 (×6): qty 2

## 2017-05-16 MED ORDER — PROPOFOL 500 MG/50ML IV EMUL
INTRAVENOUS | Status: DC | PRN
  Administered 2017-05-16: 50 ug/kg/min via INTRAVENOUS

## 2017-05-16 MED ORDER — MAGNESIUM CITRATE PO SOLN: 1.0000 | Freq: Once | ORAL | Status: DC | PRN

## 2017-05-16 MED ORDER — DIPHENHYDRAMINE HCL 12.5 MG/5ML PO ELIX: 12.5000 mg | ORAL_SOLUTION | ORAL | Status: DC | PRN

## 2017-05-16 MED ORDER — PROPOFOL 500 MG/50ML IV EMUL
INTRAVENOUS | Status: DC | PRN
  Administered 2017-05-16: 40 mg via INTRAVENOUS

## 2017-05-16 MED ORDER — KETOROLAC TROMETHAMINE 30 MG/ML IJ SOLN
INTRAMUSCULAR | Status: AC
  Filled 2017-05-16: qty 1

## 2017-05-16 MED ORDER — DEXAMETHASONE SODIUM PHOSPHATE 10 MG/ML IJ SOLN
10.0000 mg | Freq: Once | INTRAMUSCULAR | Status: AC
  Administered 2017-05-16: 10 mg via INTRAVENOUS

## 2017-05-16 MED ORDER — VANCOMYCIN HCL IN DEXTROSE 1-5 GM/200ML-% IV SOLN
1000.0000 mg | Freq: Two times a day (BID) | INTRAVENOUS | Status: AC
  Administered 2017-05-16: 1000 mg via INTRAVENOUS
  Filled 2017-05-16: qty 200

## 2017-05-16 MED ORDER — HYDROMORPHONE HCL 1 MG/ML IJ SOLN: 0.2500 mg | INTRAMUSCULAR | Status: DC | PRN

## 2017-05-16 MED ORDER — LACTATED RINGERS IV SOLN
INTRAVENOUS | Status: DC
  Administered 2017-05-16 (×2): via INTRAVENOUS

## 2017-05-16 MED ORDER — METHOCARBAMOL 500 MG PO TABS
500.0000 mg | ORAL_TABLET | Freq: Four times a day (QID) | ORAL | Status: DC | PRN
  Administered 2017-05-17 – 2017-05-18 (×4): 500 mg via ORAL
  Filled 2017-05-16 (×4): qty 1

## 2017-05-16 MED ORDER — LEVOTHYROXINE SODIUM 50 MCG PO TABS
50.0000 ug | ORAL_TABLET | Freq: Every day | ORAL | Status: DC
  Administered 2017-05-17 – 2017-05-18 (×2): 50 ug via ORAL
  Filled 2017-05-16 (×2): qty 1

## 2017-05-16 MED ORDER — POLYETHYLENE GLYCOL 3350 17 G PO PACK: 17.0000 g | PACK | Freq: Two times a day (BID) | ORAL | 0 refills | Status: DC

## 2017-05-16 MED ORDER — VANCOMYCIN HCL IN DEXTROSE 1-5 GM/200ML-% IV SOLN
1000.0000 mg | INTRAVENOUS | Status: AC
  Administered 2017-05-16: 1000 mg via INTRAVENOUS

## 2017-05-16 MED ORDER — SERTRALINE HCL 25 MG PO TABS
25.0000 mg | ORAL_TABLET | Freq: Every day | ORAL | Status: DC
  Administered 2017-05-16 – 2017-05-18 (×3): 25 mg via ORAL
  Filled 2017-05-16 (×3): qty 1

## 2017-05-16 MED ORDER — ALUM & MAG HYDROXIDE-SIMETH 200-200-20 MG/5ML PO SUSP
15.0000 mL | ORAL | Status: DC | PRN
  Administered 2017-05-17 – 2017-05-18 (×3): 15 mL via ORAL
  Filled 2017-05-16 (×3): qty 30

## 2017-05-16 MED ORDER — BUPIVACAINE IN DEXTROSE 0.75-8.25 % IT SOLN
INTRATHECAL | Status: DC | PRN
  Administered 2017-05-16: 1.8 mL via INTRATHECAL

## 2017-05-16 MED ORDER — HYDROCODONE-ACETAMINOPHEN 7.5-325 MG PO TABS
1.0000 | ORAL_TABLET | ORAL | Status: DC | PRN
  Administered 2017-05-16 – 2017-05-17 (×2): 1 via ORAL
  Filled 2017-05-16 (×2): qty 1

## 2017-05-16 MED ORDER — METHOCARBAMOL 1000 MG/10ML IJ SOLN
500.0000 mg | Freq: Four times a day (QID) | INTRAVENOUS | Status: DC | PRN
  Administered 2017-05-16: 500 mg via INTRAVENOUS
  Filled 2017-05-16: qty 550

## 2017-05-16 MED ORDER — ONDANSETRON HCL 4 MG/2ML IJ SOLN
4.0000 mg | Freq: Four times a day (QID) | INTRAMUSCULAR | Status: DC | PRN
  Administered 2017-05-16 – 2017-05-17 (×3): 4 mg via INTRAVENOUS
  Filled 2017-05-16 (×3): qty 2

## 2017-05-16 MED ORDER — MENTHOL 3 MG MT LOZG: 1.0000 | LOZENGE | OROMUCOSAL | Status: DC | PRN

## 2017-05-16 MED ORDER — METOCLOPRAMIDE HCL 5 MG/ML IJ SOLN: 5.0000 mg | Freq: Three times a day (TID) | INTRAMUSCULAR | Status: DC | PRN

## 2017-05-16 MED ORDER — PHENOL 1.4 % MT LIQD
1.0000 | OROMUCOSAL | Status: DC | PRN
  Filled 2017-05-16: qty 177

## 2017-05-16 MED ORDER — POLYETHYLENE GLYCOL 3350 17 G PO PACK
17.0000 g | PACK | Freq: Two times a day (BID) | ORAL | Status: DC
  Administered 2017-05-17 – 2017-05-18 (×3): 17 g via ORAL
  Filled 2017-05-16 (×4): qty 1

## 2017-05-16 SURGICAL SUPPLY — 47 items
ADH SKN CLS APL DERMABOND .7 (GAUZE/BANDAGES/DRESSINGS) ×1
BAG DECANTER FOR FLEXI CONT (MISCELLANEOUS) IMPLANT
BAG SPEC THK2 15X12 ZIP CLS (MISCELLANEOUS)
BAG ZIPLOCK 12X15 (MISCELLANEOUS) IMPLANT
BANDAGE ACE 6X5 VEL STRL LF (GAUZE/BANDAGES/DRESSINGS) ×2 IMPLANT
BLADE SAW SGTL 11.0X1.19X90.0M (BLADE) IMPLANT
BLADE SAW SGTL 13.0X1.19X90.0M (BLADE) ×2 IMPLANT
BOWL SMART MIX CTS (DISPOSABLE) ×2 IMPLANT
CAPT KNEE TOTAL 3 ATTUNE ×1 IMPLANT
COVER SURGICAL LIGHT HANDLE (MISCELLANEOUS) ×2 IMPLANT
CUFF TOURN SGL QUICK 34 (TOURNIQUET CUFF) ×2
CUFF TRNQT CYL 34X4X40X1 (TOURNIQUET CUFF) ×1 IMPLANT
DECANTER SPIKE VIAL GLASS SM (MISCELLANEOUS) ×2 IMPLANT
DERMABOND ADVANCED (GAUZE/BANDAGES/DRESSINGS) ×1
DERMABOND ADVANCED .7 DNX12 (GAUZE/BANDAGES/DRESSINGS) ×1 IMPLANT
DRAPE U-SHAPE 47X51 STRL (DRAPES) ×2 IMPLANT
DRESSING AQUACEL AG SP 3.5X10 (GAUZE/BANDAGES/DRESSINGS) ×1 IMPLANT
DRSG AQUACEL AG ADV 3.5X10 (GAUZE/BANDAGES/DRESSINGS) ×1 IMPLANT
DRSG AQUACEL AG SP 3.5X10 (GAUZE/BANDAGES/DRESSINGS) ×2
DURAPREP 26ML APPLICATOR (WOUND CARE) ×4 IMPLANT
ELECT REM PT RETURN 15FT ADLT (MISCELLANEOUS) ×2 IMPLANT
GLOVE BIOGEL M 7.0 STRL (GLOVE) IMPLANT
GLOVE BIOGEL PI IND STRL 7.5 (GLOVE) ×1 IMPLANT
GLOVE BIOGEL PI IND STRL 8.5 (GLOVE) ×1 IMPLANT
GLOVE BIOGEL PI INDICATOR 7.5 (GLOVE) ×1
GLOVE BIOGEL PI INDICATOR 8.5 (GLOVE) ×1
GLOVE ECLIPSE 8.0 STRL XLNG CF (GLOVE) ×2 IMPLANT
GLOVE ORTHO TXT STRL SZ7.5 (GLOVE) ×4 IMPLANT
GOWN STRL REUS W/TWL LRG LVL3 (GOWN DISPOSABLE) ×2 IMPLANT
GOWN STRL REUS W/TWL XL LVL3 (GOWN DISPOSABLE) ×2 IMPLANT
HANDPIECE INTERPULSE COAX TIP (DISPOSABLE) ×2
MANIFOLD NEPTUNE II (INSTRUMENTS) ×2 IMPLANT
PACK TOTAL KNEE CUSTOM (KITS) ×2 IMPLANT
POSITIONER SURGICAL ARM (MISCELLANEOUS) ×2 IMPLANT
SET HNDPC FAN SPRY TIP SCT (DISPOSABLE) ×1 IMPLANT
SET PAD KNEE POSITIONER (MISCELLANEOUS) ×2 IMPLANT
SUT MNCRL AB 4-0 PS2 18 (SUTURE) ×2 IMPLANT
SUT STRATAFIX 0 PDS 27 VIOLET (SUTURE) ×2
SUT VIC AB 1 CT1 36 (SUTURE) ×2 IMPLANT
SUT VIC AB 2-0 CT1 27 (SUTURE) ×6
SUT VIC AB 2-0 CT1 TAPERPNT 27 (SUTURE) ×3 IMPLANT
SUTURE STRATFX 0 PDS 27 VIOLET (SUTURE) ×1 IMPLANT
SYR 50ML LL SCALE MARK (SYRINGE) ×2 IMPLANT
TRAY FOLEY W/METER SILVER 16FR (SET/KITS/TRAYS/PACK) ×2 IMPLANT
WATER STERILE IRR 1500ML POUR (IV SOLUTION) ×2 IMPLANT
WRAP KNEE MAXI GEL POST OP (GAUZE/BANDAGES/DRESSINGS) ×2 IMPLANT
YANKAUER SUCT BULB TIP 10FT TU (MISCELLANEOUS) ×2 IMPLANT

## 2017-05-16 NOTE — Evaluation (Signed)
Physical Therapy Evaluation Patient Details Name: [name] MRN: [id number] DOB: Mar 22, 1941 Today's Date: 05/16/2017   History of Present Illness  76 yo s/p LTKA with h/o vestibular problems, RTKA several years ago, shoulder surgery back in 1990s, peripheral neuropathy, melanoma, migraines.   Clinical Impression  Pt is s/p TKA resulting in the deficits listed below (see PT Problem List).  Pt will benefit from skilled PT to increase their independence and safety with mobility to allow discharge to the venue listed below in order to eventually discharge home alone at independent level.     Follow Up Recommendations DC plan and follow up therapy as arranged by surgeon;SNF    Equipment Recommendations  None recommended by PT    Recommendations for Other Services       Precautions / Restrictions Precautions Precautions: Knee Restrictions Weight Bearing Restrictions: No      Mobility  Bed Mobility Overal bed mobility: Needs Assistance Bed Mobility: Supine to Sit;Sit to Supine     Supine to sit: Min assist     General bed mobility comments: Assist with LE and for minimal scooting to Edge due to slightly light headed.   Transfers Overall transfer level: Needs assistance Equipment used: Rolling walker (2 wheeled) Transfers: Sit to/from Stand Sit to Stand: Min assist         General transfer comment: cues for RW safety and hand placement   Ambulation/Gait Ambulation/Gait assistance: Min assist Ambulation Distance (Feet): 65 Feet Assistive device: Rolling walker (2 wheeled) Gait Pattern/deviations: Step-to pattern     General Gait Details: cues for stepping to sequence and safety for POD0 today. had some hesoitation and some c/o dizziness especially upon sitting due to her gaze changing directions setting off dizzy sensations   Stairs            Wheelchair Mobility    Modified Rankin (Stroke Patients Only)       Balance                                              Pertinent Vitals/Pain Pain Assessment: 0-10 Pain Score: 3  Pain Location: Lknee area , "feels better after I started getting up to move"  Pain Descriptors / Indicators: Aching Pain Intervention(s): Monitored during session;Ice applied    Home Living Family/patient expects to be discharged to:: [facility] in [place] ) Living Arrangements: Alone                    Prior Function Level of Independence: Independent         Comments: drives. Pt lives alone in 1 story home has a fear of falling due to her history of vestibular , single level home, 2-3 steps to enter, but states her plan is to DC to SNF for several days for rehab prior to home.      Hand Dominance        Extremity/Trunk Assessment        Lower Extremity Assessment Lower Extremity Assessment: LLE deficits/detail LLE Deficits / Details: tolerated heel slides and SLR independently for first 5 times, weakness (some lag)  and soreness after that.        Communication   Communication: No difficulties  Cognition Arousal/Alertness: Awake/alert Behavior During Therapy: WFL for tasks assessed/performed Overall Cognitive Status: Within Functional Limits for tasks assessed  General Comments      Exercises Total Joint Exercises Ankle Circles/Pumps: AROM;Both;10 reps;Supine Quad Sets: AROM;Left;Supine;10 reps Heel Slides: AAROM;Supine;10 reps;Left Hip ABduction/ADduction: AAROM;Left;10 reps;Supine Straight Leg Raises: AAROM;Supine;Left;10 reps Goniometric ROM: 0-75 supine   Assessment/Plan    PT Assessment Patient needs continued PT services  PT Problem List Decreased strength;Decreased range of motion;Decreased activity tolerance;Decreased mobility       PT Treatment Interventions DME instruction;Gait training;Functional mobility training;Therapeutic activities;Therapeutic  exercise;Patient/family education    PT Goals (Current goals can be found in the Care Plan section)  Acute Rehab PT Goals Patient Stated Goal: I want to start exercising and walking around the block again  PT Goal Formulation: With patient Time For Goal Achievement: 05/23/17 Potential to Achieve Goals: Good    Frequency 7X/week   Barriers to discharge        Co-evaluation               AM-PAC PT "6 Clicks" Daily Activity  Outcome Measure Difficulty turning over in bed (including adjusting bedclothes, sheets and blankets)?: Unable Difficulty moving from lying on back to sitting on the side of the bed? : Unable Difficulty sitting down on and standing up from a chair with arms (e.g., wheelchair, bedside commode, etc,.)?: Unable Help needed moving to and from a bed to chair (including a wheelchair)?: A Little Help needed walking in hospital room?: A Little Help needed climbing 3-5 steps with a railing? : A Lot 6 Click Score: 11    End of Session Equipment Utilized During Treatment: Gait belt Activity Tolerance: Patient tolerated treatment well Patient left: in chair;with call bell/phone within reach;with chair alarm set Nurse Communication: Mobility status PT Visit Diagnosis: Other abnormalities of gait and mobility (R26.89)    Time: 3235-5732 PT Time Calculation (min) (ACUTE ONLY): 30 min   Charges:   PT Evaluation $PT Eval Low Complexity: 1 Low PT Treatments $Gait Training: 8-22 mins   PT G Codes:   PT G-Codes **NOT FOR INPATIENT CLASS** Functional Assessment Tool Used: AM-PAC 6 Clicks Basic Mobility;Clinical judgement Functional Limitation: Mobility: Walking and moving around Mobility: Walking and Moving Around Current Status (K0254): At least 20 percent but less than 40 percent impaired, limited or restricted Mobility: Walking and Moving Around Goal Status [phone]): 0 percent impaired, limited or restricted    Clide Dales, PT Pager:  [phone] 05/16/2017   [name], Gatha Mayer 05/16/2017, 4:47 PM

## 2017-05-16 NOTE — Interval H&P Note (Signed)
History and Physical Interval Note:  05/16/2017 8:22 AM  [name]  has presented today for surgery, with the diagnosis of Left knee osteoarthritis  The various methods of treatment have been discussed with the patient and family. After consideration of risks, benefits and other options for treatment, the patient has consented to  Procedure(s) with comments: LEFT TOTAL KNEE ARTHROPLASTY (Left) - 70 mins as a surgical intervention .  The patient's history has been reviewed, patient examined, no change in status, stable for surgery.  I have reviewed the patient's chart and labs.  Questions were answered to the patient's satisfaction.     Mauri Pole

## 2017-05-16 NOTE — Progress Notes (Signed)
Assisted Dr. Hollis with left, ultrasound guided, adductor canal block. Side rails up, monitors on throughout procedure. See vital signs in flow sheet. Tolerated Procedure well.  

## 2017-05-16 NOTE — Anesthesia Preprocedure Evaluation (Addendum)
Anesthesia Evaluation  Patient identified by MRN, date of birth, ID band Patient awake    Reviewed: Allergy & Precautions, NPO status , Patient's Chart, lab work & pertinent test results  History of Anesthesia Complications (+) PONV and history of anesthetic complications  Airway Mallampati: II  TM Distance: >3 FB Neck ROM: Full    Dental  (+) Teeth Intact, Dental Advisory Given   Pulmonary neg pulmonary ROS,    breath sounds clear to auscultation       Cardiovascular + CAD and + Peripheral Vascular Disease   Rhythm:Regular Rate:Normal     Neuro/Psych  Headaches, PSYCHIATRIC DISORDERS Anxiety Depression  Neuromuscular disease CVA negative neurological ROS     GI/Hepatic Neg liver ROS, GERD  Medicated,  Endo/Other  Hypothyroidism   Renal/GU negative Renal ROS     Musculoskeletal  (+) Arthritis ,   Abdominal   Peds  Hematology negative hematology ROS (+)   Anesthesia Other Findings Day of surgery medications reviewed with the patient.  Reproductive/Obstetrics                            Lab Results  Component Value Date   WBC 6.5 05/10/2017   HGB 14.0 05/10/2017   HCT 42.3 05/10/2017   MCV 91.2 05/10/2017   PLT 200 05/10/2017   Echo: - Left ventricle: The cavity size was normal. Systolic function was   normal. The estimated ejection fraction was in the range of 60%   to 65%. Wall motion was normal; there were no regional wall   motion abnormalities. Doppler parameters are consistent with   abnormal left ventricular relaxation (grade 1 diastolic   dysfunction). - Left atrium: The atrium was normal in size. - Right ventricle: Systolic function was normal. - Pulmonary arteries: Systolic pressure was within the normal   range.  EKG: sinus bradycardia.  Anesthesia Physical Anesthesia Plan  ASA: II  Anesthesia Plan: Spinal   Post-op Pain Management:  Regional for Post-op pain    Induction: Intravenous  PONV Risk Score and Plan: 3 and Ondansetron, Dexamethasone, Midazolam, Propofol infusion and Treatment may vary due to age or medical condition  Airway Management Planned: Natural Airway  Additional Equipment:   Intra-op Plan:   Post-operative Plan:   Informed Consent: I have reviewed the patients History and Physical, chart, labs and discussed the procedure including the risks, benefits and alternatives for the proposed anesthesia with the patient or authorized representative who has indicated his/her understanding and acceptance.     Plan Discussed with: CRNA  Anesthesia Plan Comments:         Anesthesia Quick Evaluation

## 2017-05-16 NOTE — Anesthesia Procedure Notes (Signed)
Spinal  Patient location during procedure: OR Start time: 05/16/2017 9:30 AM End time: 05/16/2017 9:35 AM Staffing Anesthesiologist: Suella Broad D Resident/CRNA: Chrystine Oiler G Performed: resident/CRNA  Preanesthetic Checklist Completed: patient identified, site marked, surgical consent, pre-op evaluation, timeout performed, IV checked, risks and benefits discussed and monitors and equipment checked Spinal Block Patient position: sitting Prep: Betadine and site prepped and draped Patient monitoring: heart rate, continuous pulse ox and blood pressure Approach: midline Location: L2-3 Injection technique: single-shot Needle Needle type: Pencan  Needle gauge: 24 G Needle length: 9 cm Needle insertion depth: 5 cm Assessment Sensory level: T6 Additional Notes Kit expiration date checked and verified. - heme, -paraesthesia, + CSF pre and post injection, patient tolerated well.

## 2017-05-16 NOTE — Discharge Instructions (Signed)

## 2017-05-16 NOTE — Transfer of Care (Signed)
Immediate Anesthesia Transfer of Care Note  Patient: [name]  Procedure(s) Performed: LEFT TOTAL KNEE ARTHROPLASTY (Left Knee)  Patient Location: PACU  Anesthesia Type:Spinal  Level of Consciousness: awake, alert  and oriented  Airway & Oxygen Therapy: Patient Spontanous Breathing and Patient connected to face mask oxygen  Post-op Assessment: Report given to RN and Post -op Vital signs reviewed and stable  Post vital signs: Reviewed and stable  Last Vitals:  Vitals:   05/16/17 0918 05/16/17 0919  BP:  (!) 145/69  Pulse: 69 68  Resp: 17 17  Temp:    SpO2: 95% 95%    Last Pain:  Vitals:   05/16/17 0756  TempSrc: Oral         Complications: No apparent anesthesia complications

## 2017-05-16 NOTE — Anesthesia Procedure Notes (Signed)
Anesthesia Regional Block: Adductor canal block   Pre-Anesthetic Checklist: ,, timeout performed, Correct Patient, Correct Site, Correct Laterality, Correct Procedure, Correct Position, site marked, Risks and benefits discussed,  Surgical consent,  Pre-op evaluation,  At surgeon's request and post-op pain management  Laterality: Left  Prep: chloraprep       Needles:  Injection technique: Single-shot  Needle Type: Echogenic Needle     Needle Length: 9cm  Needle Gauge: 21     Additional Needles:   Procedures:,,,, ultrasound used (permanent image in chart),,,,  Narrative:  Start time: 05/16/2017 9:15 AM End time: 05/16/2017 9:25 AM Injection made incrementally with aspirations every 5 mL.  Performed by: Personally  Anesthesiologist: Suella Broad D  Additional Notes: Patient tolerated the procedure well. Local anesthetic introduced in an incremental fashion under minimal resistance after negative aspirations. No paresthesias were elicited. After completion of the procedure, no acute issues were identified and patient continued to be monitored by RN.

## 2017-05-16 NOTE — Anesthesia Postprocedure Evaluation (Signed)
Anesthesia Post Note  Patient: [name]  Procedure(s) Performed: LEFT TOTAL KNEE ARTHROPLASTY (Left Knee)     Patient location during evaluation: PACU Anesthesia Type: Spinal Level of consciousness: oriented and awake and alert Pain management: pain level controlled Vital Signs Assessment: post-procedure vital signs reviewed and stable Respiratory status: spontaneous breathing, respiratory function stable and patient connected to nasal cannula oxygen Cardiovascular status: blood pressure returned to baseline and stable Postop Assessment: no backache, no apparent nausea or vomiting, spinal receding, patient able to bend at knees and no headache Anesthetic complications: no    Last Vitals:  Vitals:   05/16/17 1401 05/16/17 1448  BP: (!) 144/90 137/69  Pulse: 64 63  Resp: 16 15  Temp: 36.6 C 36.5 C  SpO2: 100% 96%    Last Pain:  Vitals:   05/16/17 1448  TempSrc: Oral  PainSc:                  Effie Berkshire

## 2017-05-16 NOTE — Op Note (Signed)
NAME:  [name] RECORD NO.:  [id number]                             FACILITY:  [facility]      PHYSICIAN:  Pietro Cassis. Alvan Dame, M.D.  DATE OF BIRTH:  1940-11-23      DATE OF PROCEDURE:  05/16/2017                                     OPERATIVE REPORT         PREOPERATIVE DIAGNOSIS:  Left knee osteoarthritis.      POSTOPERATIVE DIAGNOSIS:  Left knee osteoarthritis.      FINDINGS:  The patient was noted to have complete loss of cartilage and   bone-on-bone arthritis with associated osteophytes in the lateral and patellofemoral compartments of   the knee with a significant synovitis and associated effusion.      PROCEDURE:  Left total knee replacement.      COMPONENTS USED:  DePuy Attune rotating platform posterior stabilized knee   system, a size 5 femur, 5 tibia, size 6 mm PS AOX insert, and 38 anatomic patellar   button.      SURGEON:  Pietro Cassis. Alvan Dame, M.D.      ASSISTANT:  Danae Orleans, PA-C.      ANESTHESIA:  Regional and Spinal.      SPECIMENS:  None.      COMPLICATION:  None.      DRAINS:  None.  EBL: <150cc      TOURNIQUET TIME:   28 min @250  mmHg     The patient was stable to the recovery room.      INDICATION FOR PROCEDURE:  [name] is a 76 y.o. female patient of   mine.  The patient had been seen, evaluated, and treated conservatively in the   office with medication, activity modification, and injections.  The patient had   radiographic changes of bone-on-bone arthritis with endplate sclerosis and osteophytes noted.      The patient failed conservative measures including medication, injections, and activity modification, and at this point was ready for more definitive measures.   Based on the radiographic changes and failed conservative measures, the patient   decided to proceed with total knee replacement.  Risks of infection,   DVT, component failure, need for revision surgery, postop course, and   expectations were all    discussed and reviewed.  Consent was obtained for benefit of pain   relief.      PROCEDURE IN DETAIL:  The patient was brought to the operative theater.   Once adequate anesthesia, preoperative antibiotics, 2 gm of Ancef, 1 gm of Tranexamic Acid, and 10 mg of Decadron administered, the patient was positioned supine with the left thigh tourniquet placed.  The  left lower extremity was prepped and draped in sterile fashion.  A time-   out was performed identifying the patient, planned procedure, and   extremity.      The left lower extremity was placed in the [facility] leg holder.  The leg was   exsanguinated, tourniquet elevated to 250 mmHg.  A midline incision was   made followed by median parapatellar arthrotomy.  Following initial   exposure, attention  was first directed to the patella.  Precut   measurement was noted to be 23 mm.  I resected down to 14 mm and used a   38 anatomic patellar button to restore patellar height as well as cover the cut   surface.      The lug holes were drilled and a metal shim was placed to protect the   patella from retractors and saw blades.      At this point, attention was now directed to the femur.  The femoral   canal was opened with a drill, irrigated to try to prevent fat emboli.  An   intramedullary rod was passed at 3 degrees valgus, 9 mm of bone was   resected off the distal femur.  Following this resection, the tibia was   subluxated anteriorly.  Using the extramedullary guide, 4 mm of bone was resected off   the proximal lateral tibia.  We confirmed the gap would be   stable medially and laterally with a 10 mm insert as well as confirmed   the cut was perpendicular in the coronal plane, checking with an alignment rod.      Once this was done, I sized the femur to be a size 5 in the anterior-   posterior dimension, chose a standard component based on medial and   lateral dimension.  The size 5 rotation block was then pinned in   position  anterior referenced using the C-clamp to set rotation.  The   anterior, posterior, and  chamfer cuts were made without difficulty nor   notching making certain that I was along the anterior cortex to help   with flexion gap stability.      The final box cut was made off the lateral aspect of distal femur.      At this point, the tibia was sized to be a size 5, the size 5 tray was   then pinned in position through the medial third of the tubercle,   drilled, and keel punched.  Trial reduction was now carried with a 5 femur,  5 tibia, a size 6 mm PS insert, and the 38 anatomic patella botton.  The knee was brought to   extension, full extension with good flexion stability with the patella   tracking through the trochlea without application of pressure.  Given   all these findings the femoral lug holes were drilled and then the trial components removed.  Final components were   opened and cement was mixed.  The knee was irrigated with normal saline   solution and pulse lavage.  The synovial lining was   then injected with 30 cc of 0.25% Marcaine with epinephrine and 1 cc of Toradol plus 30 cc of NS for a total of 61 cc.      The knee was irrigated.  Final implants were then cemented onto clean and   dried cut surfaces of bone with the knee brought to extension with a size 6 mm PS trial insert.      Once the cement had fully cured, the excess cement was removed   throughout the knee.  I confirmed I was satisfied with the range of   motion and stability, and the final size 6 mm PS AOX insert was chosen.  It was   placed into the knee.      The tourniquet had been let down at 28 minutes.  No significant   hemostasis required.  The medium Hemovac drain  was placed deep.  The   extensor mechanism was then reapproximated using #1 Vicryl and #0 Stratafix sutures with the knee   in flexion.  The   remaining wound was closed with 2-0 Vicryl and running 4-0 Monocryl.   The knee was cleaned, dried,  dressed sterilely using Dermabond and   Aquacel dressing.  The patient was then   brought to recovery room in stable condition, tolerating the procedure   well.   Please note that Physician Assistant, Danae Orleans, PA-C, was present for the entirety of the case, and was utilized for pre-operative positioning, peri-operative retractor management, general facilitation of the procedure.  He was also utilized for primary wound closure at the end of the case.              Pietro Cassis Alvan Dame, M.D.    05/16/2017 11:05 AM

## 2017-05-16 NOTE — Anesthesia Procedure Notes (Signed)
Date/Time: 05/16/2017 9:24 AM Performed by: Glory Buff Oxygen Delivery Method: Nasal cannula

## 2017-05-17 DIAGNOSIS — Z96651 Presence of right artificial knee joint: Secondary | ICD-10-CM | POA: Diagnosis present

## 2017-05-17 DIAGNOSIS — Z885 Allergy status to narcotic agent status: Secondary | ICD-10-CM | POA: Diagnosis not present

## 2017-05-17 DIAGNOSIS — I2510 Atherosclerotic heart disease of native coronary artery without angina pectoris: Secondary | ICD-10-CM | POA: Diagnosis present

## 2017-05-17 DIAGNOSIS — E039 Hypothyroidism, unspecified: Secondary | ICD-10-CM | POA: Diagnosis present

## 2017-05-17 DIAGNOSIS — K219 Gastro-esophageal reflux disease without esophagitis: Secondary | ICD-10-CM | POA: Diagnosis present

## 2017-05-17 DIAGNOSIS — M1712 Unilateral primary osteoarthritis, left knee: Secondary | ICD-10-CM | POA: Diagnosis present

## 2017-05-17 DIAGNOSIS — Z888 Allergy status to other drugs, medicaments and biological substances status: Secondary | ICD-10-CM | POA: Diagnosis not present

## 2017-05-17 DIAGNOSIS — Z7982 Long term (current) use of aspirin: Secondary | ICD-10-CM | POA: Diagnosis not present

## 2017-05-17 DIAGNOSIS — Z79899 Other long term (current) drug therapy: Secondary | ICD-10-CM | POA: Diagnosis not present

## 2017-05-17 DIAGNOSIS — Z880 Allergy status to penicillin: Secondary | ICD-10-CM | POA: Diagnosis not present

## 2017-05-17 DIAGNOSIS — E782 Mixed hyperlipidemia: Secondary | ICD-10-CM | POA: Diagnosis present

## 2017-05-17 DIAGNOSIS — Z8673 Personal history of transient ischemic attack (TIA), and cerebral infarction without residual deficits: Secondary | ICD-10-CM | POA: Diagnosis not present

## 2017-05-17 DIAGNOSIS — F329 Major depressive disorder, single episode, unspecified: Secondary | ICD-10-CM | POA: Diagnosis present

## 2017-05-17 LAB — CBC
HCT: 35.7 % — ABNORMAL LOW (ref 36.0–46.0)
Hemoglobin: 11.8 g/dL — ABNORMAL LOW (ref 12.0–15.0)
MCH: 29.9 pg (ref 26.0–34.0)
MCHC: 33.1 g/dL (ref 30.0–36.0)
MCV: 90.6 fL (ref 78.0–100.0)
Platelets: 184 10*3/uL (ref 150–400)
RBC: 3.94 MIL/uL (ref 3.87–5.11)
RDW: 13.1 % (ref 11.5–15.5)
WBC: 12.3 10*3/uL — ABNORMAL HIGH (ref 4.0–10.5)

## 2017-05-17 LAB — BASIC METABOLIC PANEL
Anion gap: 9 (ref 5–15)
BUN: 19 mg/dL (ref 6–20)
CO2: 24 mmol/L (ref 22–32)
Calcium: 8.3 mg/dL — ABNORMAL LOW (ref 8.9–10.3)
Chloride: 106 mmol/L (ref 101–111)
Creatinine, Ser: 0.79 mg/dL (ref 0.44–1.00)
GFR calc Af Amer: >60 mL/min (ref >60)
GFR calc non Af Amer: >60 mL/min (ref >60)
Glucose, Bld: 144 mg/dL — ABNORMAL HIGH (ref 65–99)
Potassium: 4.1 mmol/L (ref 3.5–5.1)
Sodium: 139 mmol/L (ref 135–145)

## 2017-05-17 NOTE — Progress Notes (Signed)
Plan for d/c to SNF, discharge planning per CSW. [phone] 

## 2017-05-17 NOTE — Clinical Social Work Note (Signed)
Clinical Social Work Assessment  Patient Details  Name: [name] MRN: [id number] Date of Birth: 03-Oct-1940  Date of referral:  05/17/17               Reason for consult:  Facility Placement, Discharge Planning                Permission sought to share information with:  Case Manager, Customer service manager Permission granted to share information::  Yes, Verbal Permission Granted  Name::        Agency::  [facility]  Relationship::     Contact Information:     Housing/Transportation Living arrangements for the past 2 months:  Single Family Home Source of Information:  Patient, Medical Team, Case Manager, Facility Patient Interpreter Needed:  None Criminal Activity/Legal Involvement Pertinent to Current Situation/Hospitalization:  No - Comment as needed Significant Relationships:  Other Family Members, Adult Children Lives with:  Self Do you feel safe going back to the place where you live?  No Need for family participation in patient care:  No (Coment)  Care giving concerns:  Patient admitted from home for surgery: total knee. Patient reports she lives alone, has support from her adult children and requesting SNF at discharge, specifically [facility] [facility]    Social Worker assessment / plan:  Consult completed and referral sent to [facility] completed SNF work up. Call placed to [place] regarding insurance [place].  This is pending currently and Health Team will follow up with authorization once reviewed.  Most likely 11/01.  SNF at [place]. [facility] was contacted and updated.  Reviewing referral for patient and feel they will have a bed on 11/01.  Employment status:  Retired Nurse, adult PT Recommendations:  [place] / Referral to community resources:  [place]  Patient/Family's Response to care:  Patient understanding and agreeable to SNF.  Patient/Family's  Understanding of and Emotional Response to Diagnosis, Current Treatment, and Prognosis:  Patient and facility voice that patient has been calling weekly to ensure patient will have bed at [facility] as this is the only facility she will complete rehab at.   Emotional Assessment Appearance:  Appears stated age Attitude/Demeanor/Rapport:    Affect (typically observed):  Accepting, Adaptable, Pleasant Orientation:  Oriented to Self, Oriented to Place, Oriented to  Time, Oriented to Situation Alcohol / Substance use:  Not Applicable Psych involvement (Current and /or in the community):  No (Comment)  Discharge Needs  Concerns to be addressed:  Denies Needs/Concerns at this time Readmission within the last 30 days:  No Current discharge risk:  None Barriers to Discharge:  No Barriers Identified, Continued Medical Work up, [place], [place], [place] 05/17/2017, 9:59 AM

## 2017-05-17 NOTE — Clinical Social Work Placement (Addendum)
9:02 AM 05/18/2017 Patient is medically stable for discharge to SNF.  [facility] Patient still pending insurance authorization, however should receive this morning. Patient most likely will transport by EMS as discussed on 10/31, but will confirm with patient. Facility made aware of discharge today and agreeable. All documents sent to the facility for review. Patient would like to each lunch per discussion on 10/31 and DC to SNF.   CLINICAL SOCIAL WORK PLACEMENT  NOTE  Date:  05/17/2017  Patient Details  Name: [name] MRN: [id number] Date of Birth: 03-16-1941  Clinical Social Work is seeking post-discharge placement for this patient at the [place] level of care (*CSW will initial, date and re-position this form in  chart as items are completed):  Yes   Patient/family provided with [facility] Work Department's list of facilities offering this level of care within the geographic area requested by the patient (or if unable, by the patient's family).  Yes   Patient/family informed of their freedom to choose among providers that offer the needed level of care, that participate in Medicare, Medicaid or managed care program needed by the patient, have an available bed and are willing to accept the patient.  Yes   Patient/family informed of [facility]'s ownership interest in [facility] and [facility], as well as of the fact that they are under no obligation to receive care at these facilities.  PASRR submitted to EDS on       PASRR number received on       Existing PASRR number confirmed on 05/17/17     FL2 transmitted to all facilities in geographic area requested by pt/family on 05/17/17     FL2 transmitted to all facilities within larger geographic area on       Patient informed that his/her managed care company has contracts with or will negotiate with certain facilities, including the following:            Patient/family  informed of bed offers received.  Patient chooses bed at       Physician recommends and patient chooses bed at      Patient to be transferred to   on  .  Patient to be transferred to facility by       Patient family notified on   of transfer.  Name of family member notified:        PHYSICIAN Please sign FL2     Additional Comment:    _______________________________________________ Lilly Cove, LCSW 05/17/2017, 9:46 AM

## 2017-05-17 NOTE — Progress Notes (Signed)
Physical Therapy Treatment Patient Details Name: JENNIER SCHISSLER MRN: [id number] DOB: 08/17/1940 Today's Date: 05/17/2017    History of Present Illness 76 yo s/p LTKA with h/o vestibular problems, RTKA several years ago, shoulder surgery back in 1990s, peripheral neuropathy, melanoma, migraines.     PT Comments    POD # 1 pm session Ambulated 120 ft with walker requiring min guard. Required 25% VCs for transfers for hand placement, turn completion and safety. Positioned in bed.   Follow Up Recommendations  DC plan and follow up therapy as arranged by surgeon;SNF     Equipment Recommendations  None recommended by PT    Recommendations for Other Services       Precautions / Restrictions Precautions Precautions: Knee Restrictions Weight Bearing Restrictions: No Other Position/Activity Restrictions: WBAT    Mobility  Bed Mobility Overal bed mobility: Modified Independent Bed Mobility: Sit to Supine     Supine to sit: Supervision;HOB elevated Sit to supine: Supervision      Transfers Overall transfer level: Needs assistance Equipment used: Rolling walker (2 wheeled) Transfers: Sit to/from Stand Sit to Stand: Min assist         General transfer comment: VC for hand placement and safety   Ambulation/Gait Ambulation/Gait assistance: Min guard Ambulation Distance (Feet): 120 Feet Assistive device: Rolling walker (2 wheeled) Gait Pattern/deviations: Step-through pattern;Decreased weight shift to left   Gait velocity interpretation: Below normal speed for age/gender General Gait Details: steady with RW   Stairs            Wheelchair Mobility    Modified Rankin (Stroke Patients Only)       Balance Overall balance assessment: Modified Independent                                          Cognition Arousal/Alertness: Awake/alert Behavior During Therapy: WFL for tasks assessed/performed Overall Cognitive Status: Within Functional  Limits for tasks assessed                                        Exercises Total Joint Exercises Ankle Circles/Pumps: AROM;Both;10 reps;Supine Quad Sets: AROM;Left;Supine;10 reps Short Arc Quad: AROM;Left;10 reps Heel Slides: AAROM;Supine;10 reps;Left Hip ABduction/ADduction: Left;10 reps;Supine;AROM Straight Leg Raises: Supine;Left;10 reps;AROM Goniometric ROM: 0-50* AAROM L knee    General Comments        Pertinent Vitals/Pain Pain Assessment: 0-10 Pain Score: 3  Pain Location: L knee Pain Descriptors / Indicators: Tender;Aching;Sore Pain Intervention(s): Ice applied;Repositioned;Monitored during session;Patient requesting pain meds-RN notified    Home Living                      Prior Function            PT Goals (current goals can now be found in the care plan section) Acute Rehab PT Goals Patient Stated Goal: I want to start exercising and walking around the block again; travel PT Goal Formulation: With patient Time For Goal Achievement: 05/23/17 Potential to Achieve Goals: Good Progress towards PT goals: Progressing toward goals    Frequency    7X/week      PT Plan Current plan remains appropriate    Co-evaluation              AM-PAC PT "6 Clicks" Daily Activity  Outcome  Measure  Difficulty turning over in bed (including adjusting bedclothes, sheets and blankets)?: A Little Difficulty moving from lying on back to sitting on the side of the bed? : A Little Difficulty sitting down on and standing up from a chair with arms (e.g., wheelchair, bedside commode, etc,.)?: A Little Help needed moving to and from a bed to chair (including a wheelchair)?: A Little Help needed walking in hospital room?: A Little Help needed climbing 3-5 steps with a railing? : A Lot 6 Click Score: 17    End of Session Equipment Utilized During Treatment: Gait belt Activity Tolerance: Patient tolerated treatment well Patient left: in bed;with  call bell/phone within reach Nurse Communication: Patient requests pain meds PT Visit Diagnosis: Other abnormalities of gait and mobility (R26.89)       Almond Lint, SPTA [facility]  PTA [facility]  Acute  Rehab Pager      [phone]

## 2017-05-17 NOTE — Evaluation (Signed)
Occupational Therapy Evaluation Patient Details Name: [name] MRN: [id number] DOB: Feb 09, 1941 Today's Date: 05/17/2017    History of Present Illness 76 yo s/p LTKA with h/o vestibular problems, RTKA several years ago, shoulder surgery back in 1990s, peripheral neuropathy, melanoma, migraines.    Clinical Impression   Pt was admitted for the above. Pt continues to have vestibular issues/imbalance which impacts ADLs/mobility in addition to TKA. She was mod I at baseline and now needs up to max A for LB adls.  Goals in acute are for min guard to min A    Follow Up Recommendations  SNF    Equipment Recommendations  3 in 1 bedside commode    Recommendations for Other Services       Precautions / Restrictions Precautions Precautions: Knee Restrictions Weight Bearing Restrictions: No      Mobility Bed Mobility         Supine to sit: Min assist Sit to supine: Min assist   General bed mobility comments: assist for LLE  Transfers   Equipment used: Rolling walker (2 wheeled)   Sit to Stand: Min assist         General transfer comment: cues for UE/LE placement    Balance                                           ADL either performed or assessed with clinical judgement   ADL Overall ADL's : Needs assistance/impaired Eating/Feeding: Independent   Grooming: Min guard;Sitting   Upper Body Bathing: Sitting;Min guard   Lower Body Bathing: Moderate assistance;Sit to/from stand   Upper Body Dressing : Min guard;Sitting   Lower Body Dressing: Maximal assistance;Sit to/from stand                 General ADL Comments: completed ADL from EOB, sit to stand. Pt with long standing vestibular problems and felt unsteady with movement.  Even sitting, grabbed for bed/rail at times due to sense of imbalance. Pt uses compensatory strategies to minimiize effects. Introduced [employer] school teacher for [employer] dressing:  mod A for this:  needs reinforcement to move  reacher when clothing gets stuck     Vision         Perception     Praxis      Pertinent Vitals/Pain Pain Assessment: Faces Faces Pain Scale: Hurts little more Pain Location: l knee Pain Descriptors / Indicators: Aching Pain Intervention(s): Limited activity within patient's tolerance;Monitored during session;Premedicated before session;Repositioned     Hand Dominance Left   Extremity/Trunk Assessment Upper Extremity Assessment Upper Extremity Assessment: Overall WFL for tasks assessed           Communication Communication Communication: No difficulties   Cognition Arousal/Alertness: Awake/alert Behavior During Therapy: WFL for tasks assessed/performed Overall Cognitive Status: Within Functional Limits for tasks assessed                                     General Comments       Exercises     Shoulder Instructions      Home Living Family/patient expects to be discharged to:: Skilled nursing facility Living Arrangements: Alone  Prior Functioning/Environment          Comments: drives. Pt lives alone in 1 story home has a fear of falling due to her history of vestibular , single level home, 2-3 steps to enter, but states her plan is to DC to SNF for several days for rehab prior to home.         OT Problem List: Decreased strength;Decreased activity tolerance;Impaired balance (sitting and/or standing);Decreased knowledge of use of DME or AE;Decreased knowledge of precautions;Pain      OT Treatment/Interventions: Self-care/ADL training;DME and/or AE instruction;Patient/family education;Balance training    OT Goals(Current goals can be found in the care plan section) Acute Rehab OT Goals Patient Stated Goal: I want to start exercising and walking around the block again  OT Goal Formulation: With patient Time For Goal Achievement: 05/24/17 Potential to Achieve Goals: Good ADL Goals Pt  Will Perform Grooming: with min guard assist;standing Pt Will Perform Lower Body Bathing: with min guard assist;sit to/from stand;with adaptive equipment Pt Will Perform Lower Body Dressing: with min assist;with adaptive equipment;sit to/from stand Pt Will Transfer to Toilet: with min guard assist;ambulating;bedside commode Pt Will Perform Toileting - Clothing Manipulation and hygiene: with min guard assist;sit to/from stand  OT Frequency: Min 2X/week   Barriers to D/C:            Co-evaluation              AM-PAC PT "6 Clicks" Daily Activity     Outcome Measure Help from another person eating meals?: None Help from another person taking care of personal grooming?: A Little Help from another person toileting, which includes using toliet, bedpan, or urinal?: A Little Help from another person bathing (including washing, rinsing, drying)?: A Lot Help from another person to put on and taking off regular upper body clothing?: A Little Help from another person to put on and taking off regular lower body clothing?: A Lot 6 Click Score: 17   End of Session    Activity Tolerance:  (limited by nauseous feeling) Patient left: in bed;with call bell/phone within reach  OT Visit Diagnosis: Unsteadiness on feet (R26.81);Pain Pain - Right/Left: Left Pain - part of body: Knee                Time: 4081-4481 OT Time Calculation (min): 38 min Charges:  OT General Charges $OT Visit: 1 Visit OT Evaluation $OT Eval Low Complexity: 1 Low OT Treatments $Self Care/Home Management : 8-22 mins G-Codes: OT G-codes **NOT FOR INPATIENT CLASS** Functional Assessment Tool Used: Clinical judgement Functional Limitation: Self care Self Care Current Status (E5631): At least 60 percent but less than 80 percent impaired, limited or restricted Self Care Goal Status (S9702): At least 20 percent but less than 40 percent impaired, limited or restricted   Lesle Chris,  OTR/L [phone] 05/17/2017  [name] 05/17/2017, 9:53 AM

## 2017-05-17 NOTE — Progress Notes (Signed)
Physical Therapy Treatment Patient Details Name: [name] MRN: [id number] DOB: 1941-03-29 Today's Date: 05/17/2017    History of Present Illness 76 yo s/p LTKA with h/o vestibular problems, RTKA several years ago, shoulder surgery back in 1990s, peripheral neuropathy, melanoma, migraines.     PT Comments    Good progress with mobility, pt ambulated 140' with RW, performed TKA exercises without assist.    Follow Up Recommendations  DC plan and follow up therapy as arranged by surgeon;SNF     Equipment Recommendations  None recommended by PT    Recommendations for Other Services       Precautions / Restrictions Precautions Precautions: Knee Restrictions Weight Bearing Restrictions: No Other Position/Activity Restrictions: WBAT    Mobility  Bed Mobility Overal bed mobility: Modified Independent Bed Mobility: Supine to Sit     Supine to sit: Supervision;HOB elevated Sit to supine: Min assist   General bed mobility comments: assist for LLE  Transfers Overall transfer level: Needs assistance Equipment used: Rolling walker (2 wheeled) Transfers: Sit to/from Stand Sit to Stand: Min assist         General transfer comment: cues for RW safety and hand placement   Ambulation/Gait Ambulation/Gait assistance: Min guard Ambulation Distance (Feet): 140 Feet Assistive device: Rolling walker (2 wheeled) Gait Pattern/deviations: Step-through pattern;Decreased weight shift to left   Gait velocity interpretation: Below normal speed for age/gender General Gait Details: steady with RW   Stairs            Wheelchair Mobility    Modified Rankin (Stroke Patients Only)       Balance Overall balance assessment: Modified Independent                                          Cognition Arousal/Alertness: Awake/alert Behavior During Therapy: WFL for tasks assessed/performed Overall Cognitive Status: Within Functional Limits for tasks  assessed                                        Exercises Total Joint Exercises Ankle Circles/Pumps: AROM;Both;10 reps;Supine Quad Sets: AROM;Left;Supine;10 reps Short Arc Quad: AROM;Left;10 reps Heel Slides: AAROM;Supine;10 reps;Left Hip ABduction/ADduction: Left;10 reps;Supine;AROM Straight Leg Raises: Supine;Left;10 reps;AROM Goniometric ROM: 0-50* AAROM L knee    General Comments        Pertinent Vitals/Pain Pain Assessment: Faces Pain Score: 5  Faces Pain Scale: Hurts little more Pain Location: L knee Pain Descriptors / Indicators: Aching;Sore Pain Intervention(s): Limited activity within patient's tolerance;Monitored during session;Premedicated before session;Ice applied    Home Living Family/patient expects to be discharged to:: Skilled nursing facility Living Arrangements: Alone                  Prior Function        Comments: drives. Pt lives alone in 1 story home has a fear of falling due to her history of vestibular , single level home, 2-3 steps to enter, but states her plan is to DC to SNF for several days for rehab prior to home.    PT Goals (current goals can now be found in the care plan section) Acute Rehab PT Goals Patient Stated Goal: I want to start exercising and walking around the block again; travel PT Goal Formulation: With patient Time For Goal Achievement: 05/23/17 Potential to  Achieve Goals: Good Progress towards PT goals: Progressing toward goals    Frequency    7X/week      PT Plan Current plan remains appropriate    Co-evaluation              AM-PAC PT "6 Clicks" Daily Activity  Outcome Measure  Difficulty turning over in bed (including adjusting bedclothes, sheets and blankets)?: A Little Difficulty moving from lying on back to sitting on the side of the bed? : A Little Difficulty sitting down on and standing up from a chair with arms (e.g., wheelchair, bedside commode, etc,.)?: A Little Help  needed moving to and from a bed to chair (including a wheelchair)?: A Little Help needed walking in hospital room?: A Little Help needed climbing 3-5 steps with a railing? : A Lot 6 Click Score: 17    End of Session Equipment Utilized During Treatment: Gait belt Activity Tolerance: Patient tolerated treatment well Patient left: in chair;with call bell/phone within reach Nurse Communication: Mobility status PT Visit Diagnosis: Other abnormalities of gait and mobility (R26.89)     Time: 2229-7989 PT Time Calculation (min) (ACUTE ONLY): 37 min  Charges:  $Gait Training: 8-22 mins $Therapeutic Exercise: 8-22 mins                    G Codes:          Philomena Doheny 05/17/2017, 12:48 PM [phone]

## 2017-05-17 NOTE — Progress Notes (Addendum)
     Subjective: 1 Day Post-Op Procedure(s) (LRB): LEFT TOTAL KNEE ARTHROPLASTY (Left)   Seen by Dr. Alvan Dame. Patient reports pain as mild, pain controlled. No events throughout the night.  Plan for discharge to SNF due to underlying medical co-morbidities, need for therapy to progress and concern for safety at home.   Objective:   VITALS:   Vitals:   05/17/17 0221 05/17/17 0645  BP: 137/69 (!) 161/74  Pulse: 60 66  Resp: 16 16  Temp: (!) 97.4 F (36.3 C) 97.7 F (36.5 C)  SpO2: 99% 98%    Dorsiflexion/Plantar flexion intact Incision: dressing C/D/I No cellulitis present Compartment soft  LABS  Recent Labs  05/17/17 0514  HGB 11.8*  HCT 35.7*  WBC 12.3*  PLT 184     Recent Labs  05/17/17 0514  NA 139  K 4.1  BUN 19  CREATININE 0.79  GLUCOSE 144*     Assessment/Plan: 1 Day Post-Op Procedure(s) (LRB): LEFT TOTAL KNEE ARTHROPLASTY (Left) Foley cath d/c'ed Advance diet Up with therapy D/C IV fluids Discharge to SNF eventually, when ready  Obese (BMI 30-39.9) Estimated body mass index is 31.12 kg/m as calculated from the following:   Height as of this encounter: 5\' 5"  (1.651 m).   Weight as of this encounter: 84.8 kg (187 lb). Patient also counseled that weight may inhibit the healing process Patient counseled that losing weight will help with future health issues       [place]. Tienna Bienkowski   PAC  05/17/2017, 7:48 AM

## 2017-05-17 NOTE — NC FL2 (Signed)
[place] LEVEL OF CARE SCREENING TOOL     IDENTIFICATION  Patient Name: [name] Birthdate: 11-11-1940 Sex: female Admission Date (Current Location): 05/16/2017  [facility] and [place] Number:  Herbalist and Address:  [facility],  [place] [address] [place]      Provider Number: [id number]  Attending Physician Name and Address:  Paralee Cancel, MD  Relative Name and Phone Number:       Current Level of Care: Hospital Recommended Level of Care: [place] Prior Approval Number:    Date Approved/Denied:   PASRR Number:   [id number]  Discharge Plan: SNF    Current Diagnoses: Patient Active Problem List   Diagnosis Date Noted  . S/P left TKA 05/16/2017  . S/P total knee replacement 05/16/2017  . Female stress incontinence 05/08/2017  . Pain in pelvis 05/08/2017  . Coronary artery calcification seen on CAT scan 04/09/2017  . Carotid stenosis, asymptomatic, bilateral 04/09/2017  . Mixed hyperlipidemia 04/09/2017  . MCI (mild cognitive impairment) 12/17/2015  . Urinary frequency 08/09/2015  . Hypothyroidism 08/09/2015  . Syncope 08/08/2015  . Posterior vitreous detachment of right eye 07/23/2015  . Hereditary and idiopathic peripheral neuropathy 05/11/2015  . Acute pancreatitis 12/21/2014  . Gallstones 12/21/2014  . Abdominal pain 12/21/2014  . Acute bronchitis due to infection 07/04/2014  . Epiretinal membrane, left 04/12/2012  . Nuclear cataract 04/12/2012  . Left lower quadrant pain 08/16/2011  . Breast pain in female 07/18/2011  . Melanoma of back ([place]) 02/28/2011    Orientation RESPIRATION BLADDER Height & Weight     Self, Time, Situation, Place  O2 (2L) working to wean Continent Weight: 187 lb (84.8 kg) Height:  5\' 5"  (165.1 cm)  BEHAVIORAL SYMPTOMS/MOOD NEUROLOGICAL BOWEL NUTRITION STATUS      Continent Diet (See DC summary)  AMBULATORY STATUS COMMUNICATION OF NEEDS Skin   Extensive  Assist Verbally Surgical wounds, Skin abrasions, Bruising                       Personal Care Assistance Level of Assistance  Bathing, Feeding, Dressing Bathing Assistance: Limited assistance Feeding assistance: Independent Dressing Assistance: Limited assistance     Functional Limitations Info  Sight, Hearing, Speech Sight Info: Adequate Hearing Info: Adequate Speech Info: Adequate    SPECIAL CARE FACTORS FREQUENCY  PT (By licensed PT), OT (By licensed OT)     PT Frequency: 5x OT Frequency: 5x            Contractures Contractures Info: Not present    Additional Factors Info  Code Status, Allergies Code Status Info: Full Code Allergies Info: Epinephrine, Penicillins, Other, Valium Diazepam           Current Medications (05/17/2017):  This is the current hospital active medication list Current Facility-Administered Medications  Medication Dose Route Frequency Provider Last Rate Last Dose  . 0.9 %  sodium chloride infusion   Intravenous Continuous Danae Orleans, PA-C 100 mL/hr at 05/17/17 0215    . acetaminophen (TYLENOL) tablet 650 mg  650 mg Oral Q4H PRN Danae Orleans, PA-C       Or  . acetaminophen (TYLENOL) suppository 650 mg  650 mg Rectal Q4H PRN Danae Orleans, PA-C      . albuterol (PROVENTIL) (2.5 MG/3ML) 0.083% nebulizer solution 3 mL  3 mL Inhalation Q6H PRN Babish, Matthew, PA-C      . alum & mag hydroxide-simeth (MAALOX/MYLANTA) 200-200-20 MG/5ML suspension 15 mL  15  mL Oral Q4H PRN Danae Orleans, PA-C      . aspirin chewable tablet 81 mg  81 mg Oral BID Danae Orleans, PA-C   81 mg at 05/17/17 0998  . atorvastatin (LIPITOR) tablet 10 mg  10 mg Oral Daily Danae Orleans, PA-C   10 mg at 05/17/17 3382  . bisacodyl (DULCOLAX) suppository 10 mg  10 mg Rectal Daily PRN Danae Orleans, PA-C      . celecoxib (CELEBREX) capsule 200 mg  200 mg Oral Q12H Danae Orleans, PA-C   200 mg at 05/17/17 5053  . dextromethorphan-guaiFENesin (MUCINEX DM)  30-600 MG per 12 hr tablet 1 tablet  1 tablet Oral BID Danae Orleans, PA-C   1 tablet at 05/17/17 9767  . diphenhydrAMINE (BENADRYL) 12.5 MG/5ML elixir 12.5-25 mg  12.5-25 mg Oral Q4H PRN Danae Orleans, PA-C      . docusate sodium (COLACE) capsule 100 mg  100 mg Oral BID Danae Orleans, PA-C   100 mg at 05/17/17 3419  . ferrous sulfate tablet 325 mg  325 mg Oral TID PC Danae Orleans, PA-C   325 mg at 05/17/17 3790  . HYDROcodone-acetaminophen (NORCO) 7.5-325 MG per tablet 1 tablet  1 tablet Oral Q4H PRN Danae Orleans, PA-C   1 tablet at 05/17/17 2409  . HYDROcodone-acetaminophen (NORCO) 7.5-325 MG per tablet 2 tablet  2 tablet Oral Q4H PRN Danae Orleans, PA-C   2 tablet at 05/17/17 7353  . HYDROmorphone (DILAUDID) injection 0.5-2 mg  0.5-2 mg Intravenous Q2H PRN Danae Orleans, PA-C   0.5 mg at 05/17/17 0403  . levothyroxine (SYNTHROID, LEVOTHROID) tablet 50 mcg  50 mcg Oral QAC breakfast Danae Orleans, PA-C   50 mcg at 05/17/17 2992  . magnesium citrate solution 1 Bottle  1 Bottle Oral Once PRN Babish, Matthew, PA-C      . menthol-cetylpyridinium (CEPACOL) lozenge 3 mg  1 lozenge Oral PRN Danae Orleans, PA-C       Or  . phenol (CHLORASEPTIC) mouth spray 1 spray  1 spray Mouth/Throat PRN Danae Orleans, PA-C      . methocarbamol (ROBAXIN) tablet 500 mg  500 mg Oral Q6H PRN Danae Orleans, PA-C   500 mg at 05/17/17 4268   Or  . methocarbamol (ROBAXIN) 500 mg in dextrose 5 % 50 mL IVPB  500 mg Intravenous Q6H PRN Danae Orleans, PA-C   Stopped at 05/16/17 1945  . metoCLOPramide (REGLAN) tablet 5-10 mg  5-10 mg Oral Q8H PRN Danae Orleans, PA-C       Or  . metoCLOPramide (REGLAN) injection 5-10 mg  5-10 mg Intravenous Q8H PRN Babish, Matthew, PA-C      . ondansetron [facility]) tablet 4 mg  4 mg Oral Q6H PRN Danae Orleans, PA-C       Or  . ondansetron [facility]) injection 4 mg  4 mg Intravenous Q6H PRN Danae Orleans, PA-C   4 mg at 05/17/17 0725  . pantoprazole (PROTONIX) EC  tablet 40 mg  40 mg Oral Daily Danae Orleans, PA-C   40 mg at 05/17/17 3419  . polyethylene glycol (MIRALAX / GLYCOLAX) packet 17 g  17 g Oral BID Babish, Matthew, PA-C      . sertraline (ZOLOFT) tablet 25 mg  25 mg Oral Daily Danae Orleans, PA-C   25 mg at 05/17/17 6222     Discharge Medications: Please see discharge summary for a list of discharge medications.  Relevant Imaging Results:  Relevant Lab Results:   Additional Information SSN: [id number]  Lilly Cove,  LCSW

## 2017-05-18 ENCOUNTER — Encounter
Admission: RE | Admit: 2017-05-18 | Discharge: 2017-05-18 | Disposition: A | Discharge status: Home / Self Care | Payer: PPO | Source: Ambulatory Visit | Attending: Internal Medicine | Admitting: Internal Medicine

## 2017-05-18 DIAGNOSIS — M25562 Pain in left knee: Secondary | ICD-10-CM | POA: Diagnosis not present

## 2017-05-18 DIAGNOSIS — E785 Hyperlipidemia, unspecified: Secondary | ICD-10-CM | POA: Diagnosis not present

## 2017-05-18 DIAGNOSIS — R600 Localized edema: Secondary | ICD-10-CM | POA: Diagnosis not present

## 2017-05-18 DIAGNOSIS — J45998 Other asthma: Secondary | ICD-10-CM | POA: Diagnosis not present

## 2017-05-18 DIAGNOSIS — F329 Major depressive disorder, single episode, unspecified: Secondary | ICD-10-CM | POA: Diagnosis not present

## 2017-05-18 DIAGNOSIS — M6281 Muscle weakness (generalized): Secondary | ICD-10-CM | POA: Diagnosis not present

## 2017-05-18 DIAGNOSIS — M1712 Unilateral primary osteoarthritis, left knee: Secondary | ICD-10-CM | POA: Diagnosis not present

## 2017-05-18 DIAGNOSIS — H43811 Vitreous degeneration, right eye: Secondary | ICD-10-CM | POA: Diagnosis not present

## 2017-05-18 DIAGNOSIS — Z7982 Long term (current) use of aspirin: Secondary | ICD-10-CM | POA: Diagnosis not present

## 2017-05-18 DIAGNOSIS — D649 Anemia, unspecified: Secondary | ICD-10-CM | POA: Diagnosis not present

## 2017-05-18 DIAGNOSIS — R262 Difficulty in walking, not elsewhere classified: Secondary | ICD-10-CM | POA: Diagnosis not present

## 2017-05-18 DIAGNOSIS — K219 Gastro-esophageal reflux disease without esophagitis: Secondary | ICD-10-CM | POA: Diagnosis not present

## 2017-05-18 DIAGNOSIS — I2510 Atherosclerotic heart disease of native coronary artery without angina pectoris: Secondary | ICD-10-CM | POA: Diagnosis not present

## 2017-05-18 DIAGNOSIS — G8911 Acute pain due to trauma: Secondary | ICD-10-CM | POA: Diagnosis not present

## 2017-05-18 DIAGNOSIS — Z471 Aftercare following joint replacement surgery: Secondary | ICD-10-CM | POA: Diagnosis not present

## 2017-05-18 DIAGNOSIS — E039 Hypothyroidism, unspecified: Secondary | ICD-10-CM | POA: Diagnosis not present

## 2017-05-18 DIAGNOSIS — M79605 Pain in left leg: Secondary | ICD-10-CM | POA: Diagnosis not present

## 2017-05-18 DIAGNOSIS — M1990 Unspecified osteoarthritis, unspecified site: Secondary | ICD-10-CM | POA: Diagnosis not present

## 2017-05-18 DIAGNOSIS — Z96652 Presence of left artificial knee joint: Secondary | ICD-10-CM | POA: Diagnosis not present

## 2017-05-18 LAB — BASIC METABOLIC PANEL
Anion gap: 7 (ref 5–15)
BUN: 23 mg/dL — ABNORMAL HIGH (ref 6–20)
CO2: 27 mmol/L (ref 22–32)
Calcium: 8.1 mg/dL — ABNORMAL LOW (ref 8.9–10.3)
Chloride: 105 mmol/L (ref 101–111)
Creatinine, Ser: 0.9 mg/dL (ref 0.44–1.00)
GFR calc Af Amer: >60 mL/min (ref >60)
GFR calc non Af Amer: >60 mL/min (ref >60)
Glucose, Bld: 143 mg/dL — ABNORMAL HIGH (ref 65–99)
Potassium: 4.2 mmol/L (ref 3.5–5.1)
Sodium: 139 mmol/L (ref 135–145)

## 2017-05-18 LAB — CBC
HCT: 33.2 % — ABNORMAL LOW (ref 36.0–46.0)
Hemoglobin: 10.8 g/dL — ABNORMAL LOW (ref 12.0–15.0)
MCH: 30.2 pg (ref 26.0–34.0)
MCHC: 32.5 g/dL (ref 30.0–36.0)
MCV: 92.7 fL (ref 78.0–100.0)
Platelets: 185 10*3/uL (ref 150–400)
RBC: 3.58 MIL/uL — ABNORMAL LOW (ref 3.87–5.11)
RDW: 13.6 % (ref 11.5–15.5)
WBC: 11.9 10*3/uL — ABNORMAL HIGH (ref 4.0–10.5)

## 2017-05-18 NOTE — Progress Notes (Signed)
Physical Therapy Treatment Patient Details Name: [name] MRN: [id number] DOB: [birth year] Today's Date: 05/18/2017    History of Present Illness 76 yo s/p LTKA with h/o vestibular problems, RTKA several years ago, shoulder surgery back in 1990s, peripheral neuropathy, melanoma, migraines.     PT Comments    Ambulated 100 ft with min guard. Transferred from EOB to standing with elevated bed, requiring 25% vc for hand placement. Reviewed HEP and gave hand out requiring 25% vc for proper tech  during exercises. Reported light headedness during sitting that diminished with rest.    Follow Up Recommendations  DC plan and follow up therapy as arranged by surgeon;SNF     Equipment Recommendations  None recommended by PT    Recommendations for Other Services       Precautions / Restrictions Precautions Precautions: Knee Restrictions Weight Bearing Restrictions: No Other Position/Activity Restrictions: WBAT    Mobility  Bed Mobility Overal bed mobility: Modified Independent Bed Mobility: Sit to Supine     Supine to sit: Supervision;HOB elevated Sit to supine: Supervision      Transfers Overall transfer level: Needs assistance Equipment used: Rolling walker (2 wheeled) Transfers: Sit to/from Stand Sit to Stand: Min assist;From elevated surface         General transfer comment: 25% VC for hand placement  Ambulation/Gait Ambulation/Gait assistance: Min guard Ambulation Distance (Feet): 100 Feet Assistive device: Rolling walker (2 wheeled) Gait Pattern/deviations: Step-through pattern;Decreased weight shift to left   Gait velocity interpretation: Below normal speed for age/gender     Stairs            Wheelchair Mobility    Modified Rankin (Stroke Patients Only)       Balance                                            Cognition Arousal/Alertness: Awake/alert Behavior During Therapy: WFL for tasks assessed/performed Overall  Cognitive Status: Within Functional Limits for tasks assessed                                        Exercises Total Joint Exercises Ankle Circles/Pumps: AROM;Both;20 reps;Seated Quad Sets: AROM;Left;10 reps;Seated Towel Squeeze: AROM;Both;10 reps;Seated Short Arc Quad: AROM;Left;Seated;10 reps Heel Slides: AROM;Left;10 reps;Supine Hip ABduction/ADduction: Left;10 reps;AROM;Supine Straight Leg Raises: Supine;Left;10 reps;AROM    General Comments        Pertinent Vitals/Pain Pain Assessment: 0-10 Pain Score: 3  Pain Location: L knee Pain Descriptors / Indicators: Tender;Aching;Sore Pain Intervention(s): Monitored during session;Ice applied;Repositioned    Home Living                      Prior Function            PT Goals (current goals can now be found in the care plan section)      Frequency    7X/week      PT Plan Current plan remains appropriate    Co-evaluation              AM-PAC PT "6 Clicks" Daily Activity  Outcome Measure  Difficulty turning over in bed (including adjusting bedclothes, sheets and blankets)?: A Little Difficulty moving from lying on back to sitting on the side of the bed? : A Little Difficulty sitting down  on and standing up from a chair with arms (e.g., wheelchair, bedside commode, etc,.)?: A Little Help needed moving to and from a bed to chair (including a wheelchair)?: A Little Help needed walking in hospital room?: A Little Help needed climbing 3-5 steps with a railing? : A Lot 6 Click Score: 17    End of Session Equipment Utilized During Treatment: Gait belt Activity Tolerance: Patient tolerated treatment well Patient left: in bed;with call bell/phone within reach   PT Visit Diagnosis: Other abnormalities of gait and mobility (R26.89)     Time: 1005-1045 PT Time Calculation (min) (ACUTE ONLY): 40 min  Charges:  $Gait Training: 8-22 mins $Therapeutic Exercise: 8-22 mins $Therapeutic  Activity: 8-22 mins                    G Codes:       Almond Lint, SPTA [facility] [place]  PTA [facility]  Acute  Rehab Pager      [phone]

## 2017-05-18 NOTE — Discharge Summary (Signed)
Physician Discharge Summary  Patient ID: [name] MRN: [id number] DOB/AGE: 1941-02-22 76 y.o.  Admit date: 05/16/2017 Discharge date:  05/18/2017  Procedures:  Procedure(s) (LRB): LEFT TOTAL KNEE ARTHROPLASTY (Left)  Attending Physician:  Dr. Paralee Cancel   Admission Diagnoses:   Left knee primary OA / pain  Discharge Diagnoses:  Principal Problem:   S/P left TKA Active Problems:   S/P total knee replacement  Past Medical History:  Diagnosis Date  . Acid reflux   . Anemia   . Anxiety    in past  . Arthritis    all over  . Atypical moles   . Bronchitis   . CAD (coronary artery disease)    patient denies on preop of 10/24   . Carotid artery calcification, bilateral 04/2016  . Complication of anesthesia    shoulder surgery- not all the way asleep  . Depression    in past  . Dizziness   . Family history of adverse reaction to anesthesia    sister- has problems with nausea and vomiting   . Fatigue   . Generalized headache    migraines  . GERD (gastroesophageal reflux disease)   . History of chemotherapy    topical cream for h/o skin CA  . History of kidney stones   . Hypothyroidism   . Insomnia   . Irritable bowel syndrome   . LLQ abdominal pain   . Melanoma ([place]) 2013  . Melanoma ([place])    x2 stg II melanoma  . Neuropathy, peripheral   . Pneumonia    hx of x 2  . PONV (postoperative nausea and vomiting)   . Pre-diabetes   . Retina disorder    left  . Stroke [facility])    ? TIA per Dr V per patient   . Thyroid disease   . Weight gain     HPI:    [name], 76 y.o. female, has a history of pain and functional disability in the left knee due to arthritis and has failed non-surgical conservative treatments for greater than 12 weeks to include NSAID's and/or analgesics, corticosteriod injections, viscosupplementation injections and activity modification.  Onset of symptoms was gradual, starting 3+ years ago with gradually worsening course since that  time. The patient noted prior procedures on the knee to include  arthroscopy and menisectomy on the left knee(s).  Patient currently rates pain in the left knee(s) at 4 out of 10 with activity. Patient has night pain, worsening of pain with activity and weight bearing, pain that interferes with activities of daily living, pain with passive range of motion, crepitus and joint swelling.  Patient has evidence of periarticular osteophytes and joint space narrowing by imaging studies.  There is no active infection.  Risks, benefits and expectations were discussed with the patient.  Risks including but not limited to the risk of anesthesia, blood clots, nerve damage, blood vessel damage, failure of the prosthesis, infection and up to and including death.  Patient understand the risks, benefits and expectations and wishes to proceed with surgery.   PCP: Leah Sons, MD (Inactive)   Discharged Condition: good  Hospital Course:  Patient underwent the above stated procedure on 05/16/2017. Patient tolerated the procedure well and brought to the recovery room in good condition and subsequently to the floor.  POD #1 BP: 161/74 ; Pulse: 66 ; Temp: 97.7 F (36.5 C) ; Resp: 16 Patient reports pain as mild, pain controlled. No events throughout the night.  Plan for  discharge to SNF due to underlying medical co-morbidities, need for therapy to progress and concern for safety at home.  Dorsiflexion/plantar flexion intact, incision: dressing C/D/I, no cellulitis present and compartment soft.   LABS  Basename    HGB     11.8  HCT     35.7   POD #2  BP: 146/66 ; Pulse: 58 ; Temp: 98 F (36.7 C) ; Resp: 16 Patient reports pain as mild, pain controlled.  No events throughout the night.  Experienced nausea yesterday, which she attributes to not timing her GERD medicine correctly.  Ready to be discharged to skilled nursing facility. Dorsiflexion/plantar flexion intact, incision: dressing C/D/I, no cellulitis  present and compartment soft.   LABS  Basename    HGB     10.8  HCT     33.2       Discharge Exam: General appearance: alert, cooperative and no distress Extremities: Homans sign is negative, no sign of DVT, no edema, redness or tenderness in the calves or thighs and no ulcers, gangrene or trophic changes  Disposition:   Skilled nursing facility with follow up in 2 weeks    Contact information for follow-up providers    Paralee Cancel, MD. Schedule an appointment as soon as possible for a visit in 2 week(s).   Specialty:  Orthopedic Surgery Contact information: [address] [place] [address] [phone]            Contact information for after-discharge care    Destination    [facility] Follow up.   Specialty:  [place] information: [address] [place] [phone]                  Discharge Instructions    Call MD / Call 911    Complete by:  As directed    If you experience chest pain or shortness of breath, CALL 911 and be transported to the hospital emergency room.  If you develope a fever above 101 F, pus (white drainage) or increased drainage or redness at the wound, or calf pain, call your surgeon's office.   Change dressing    Complete by:  As directed    Maintain surgical dressing until follow up in the clinic. If the edges start to pull up, may reinforce with tape. If the dressing is no longer working, may remove and cover with gauze and tape, but must keep the area dry and clean.  Call with any questions or concerns.   Constipation Prevention    Complete by:  As directed    Drink plenty of fluids.  Prune juice may be helpful.  You may use a stool softener, such as Colace (over the counter) 100 mg twice a day.  Use MiraLax (over the counter) for constipation as needed.   Diet - low sodium heart healthy    Complete by:  As directed    Discharge instructions     Complete by:  As directed    Maintain surgical dressing until follow up in the clinic. If the edges start to pull up, may reinforce with tape. If the dressing is no longer working, may remove and cover with gauze and tape, but must keep the area dry and clean.  Follow up in 2 weeks at [facility]. Call with any questions or concerns.   Increase activity slowly as tolerated    Complete by:  As directed    Weight bearing as tolerated  with assist device (walker, cane, etc) as directed, use it as long as suggested by your surgeon or therapist, typically at least 4-6 weeks.   TED hose    Complete by:  As directed    Use stockings (TED hose) for 2 weeks on both leg(s).  You may remove them at night for sleeping.      Allergies as of 05/18/2017      Reactions   Epinephrine Other (See Comments), Anaphylaxis   Heart palpitations   Penicillins Swelling, Other (See Comments)   Lip swelling Has patient had a PCN reaction causing immediate rash, facial/tongue/throat swelling, SOB or lightheadedness with hypotension: Yes Has patient had a PCN reaction causing severe rash involving mucus membranes or skin necrosis: No Has patient had a PCN reaction that required hospitalization No Has patient had a PCN reaction occurring within the last 10 years: No If all of the above answers are "NO", then may proceed with Cephalosporin use.   Other    Black pepper- caused bronchial tubes to start closing    Valium [diazepam] Other (See Comments)   Other reaction(s): Other (See Comments) Overly sensitive  sedation      Medication List    STOP taking these medications   aspirin EC 81 MG tablet Replaced by:  aspirin 81 MG chewable tablet     TAKE these medications   albuterol 108 (90 Base) MCG/ACT inhaler Commonly known as:  PROVENTIL HFA;VENTOLIN HFA Inhale 1-2 puffs into the lungs every 6 (six) hours as needed for wheezing or shortness of breath.   aspirin 81 MG chewable tablet Commonly  known as:  ASPIRIN CHILDRENS Chew 1 tablet (81 mg total) by mouth 2 (two) times daily. Take for 4 weeks, then resume regular dose. Replaces:  aspirin EC 81 MG tablet   atorvastatin 10 MG tablet Commonly known as:  LIPITOR Take 10 mg by mouth daily.   Biotin 1000 MCG tablet Take 1,000 mcg by mouth daily.   CALCIUM 600/VITAMIN D3 PO Take 1 tablet by mouth 2 (two) times daily.   dextromethorphan-guaiFENesin 30-600 MG 12hr tablet Commonly known as:  MUCINEX DM Take 1 tablet by mouth 2 (two) times daily.   docusate sodium 100 MG capsule Commonly known as:  COLACE Take 1 capsule (100 mg total) by mouth 2 (two) times daily.   ferrous sulfate 325 (65 FE) MG tablet Commonly known as:  FERROUSUL Take 1 tablet (325 mg total) by mouth 3 (three) times daily with meals.   Fish Oil 1000 MG Caps Take 1,000 mg by mouth daily.   folic acid 671 MCG tablet Commonly known as:  FOLVITE Take 800 mcg by mouth daily.   HYDROcodone-acetaminophen 7.5-325 MG tablet Commonly known as:  NORCO Take 1-2 tablets by mouth every 4 (four) hours as needed for moderate pain or severe pain.   levothyroxine 50 MCG tablet Commonly known as:  SYNTHROID Take 1 tablet (50 mcg total) by mouth daily before breakfast.   MELATONIN-THEANINE PO Take 1 tablet by mouth at bedtime.   methocarbamol 500 MG tablet Commonly known as:  ROBAXIN Take 1 tablet (500 mg total) by mouth every 6 (six) hours as needed for muscle spasms.   omeprazole 20 MG capsule Commonly known as:  PRILOSEC Take 1 capsule (20 mg total) by mouth daily.   polyethylene glycol packet Commonly known as:  MIRALAX / GLYCOLAX Take 17 g by mouth 2 (two) times daily.   PRESERVISION AREDS 2 PO Take 1 capsule by mouth daily.  sertraline 25 MG tablet Commonly known as:  ZOLOFT Take 25 mg by mouth daily.   vitamin B-12 1000 MCG tablet Commonly known as:  CYANOCOBALAMIN Take 2,000 mcg by mouth daily.   vitamin C 1000 MG tablet Take 1,000 mg by  mouth daily.   Vitamin D3 2000 units capsule Take 2,000 Units by mouth daily.            Discharge Care Instructions        Start     Ordered   05/17/17 0000  Change dressing    Comments:  Maintain surgical dressing until follow up in the clinic. If the edges start to pull up, may reinforce with tape. If the dressing is no longer working, may remove and cover with gauze and tape, but must keep the area dry and clean.  Call with any questions or concerns.   05/17/17 9295       Signed: [place]. Remas Sobel   PA-C  05/18/2017, 8:04 AM

## 2017-05-18 NOTE — Progress Notes (Signed)
     Subjective: 2 Days Post-Op Procedure(s) (LRB): LEFT TOTAL KNEE ARTHROPLASTY (Left)   Patient reports pain as mild, pain controlled.  No events throughout the night.  Experienced nausea yesterday, which she attributes to not timing her GERD medicine correctly.  She is much better throughout the night and into today.  Ready to be discharged to skilled nursing facility.  Objective:   VITALS:   Vitals:   05/17/17 2142 05/18/17 0545  BP: (!) 155/65 (!) 146/66  Pulse: 65 (!) 58  Resp: 15 16  Temp: 98 F (36.7 C)   SpO2: 98% 96%    Dorsiflexion/Plantar flexion intact Incision: dressing C/D/I No cellulitis present Compartment soft  LABS  Recent Labs  05/17/17 0514 05/18/17 0521  HGB 11.8* 10.8*  HCT 35.7* 33.2*  WBC 12.3* 11.9*  PLT 184 185     Recent Labs  05/17/17 0514 05/18/17 0521  NA 139 139  K 4.1 4.2  BUN 19 23*  CREATININE 0.79 0.90  GLUCOSE 144* 143*     Assessment/Plan: 2 Days Post-Op Procedure(s) (LRB): LEFT TOTAL KNEE ARTHROPLASTY (Left) Up with therapy Discharge to SNF  Follow up in 2 weeks at [facility]. Follow up with OLIN,Ellington Cornia D in 2 weeks.  Contact information:  [facility] [address] [facility] [place] [name]   PAC  05/18/2017, 7:42 AM

## 2017-05-18 NOTE — Progress Notes (Signed)
Bet B,RN called back and stated patient must have BM within 72 hours, I explained she had been given miralax daily and had been taken narcotics since surgery. I went in and spoke with patient, who stated she had a BM earlier today, checked patients chart and it had been documented by Dana,RN. I called back to [facility] and made Bet,RN aware.

## 2017-05-18 NOTE — Progress Notes (Signed)
Called report to Bet B,RN at [facility].

## 2017-05-19 ENCOUNTER — Other Ambulatory Visit: Payer: Self-pay

## 2017-05-19 DIAGNOSIS — J45998 Other asthma: Secondary | ICD-10-CM | POA: Insufficient documentation

## 2017-05-19 DIAGNOSIS — E039 Hypothyroidism, unspecified: Secondary | ICD-10-CM | POA: Diagnosis not present

## 2017-05-19 DIAGNOSIS — M1712 Unilateral primary osteoarthritis, left knee: Secondary | ICD-10-CM | POA: Insufficient documentation

## 2017-05-19 MED ORDER — HYDROCODONE-ACETAMINOPHEN 7.5-325 MG PO TABS: 1.0000 | ORAL_TABLET | ORAL | 0 refills | Status: DC | PRN

## 2017-05-19 NOTE — Telephone Encounter (Signed)
Rx sent to [facility] phone : [phone] , fax : [phone]  

## 2017-05-23 ENCOUNTER — Non-Acute Institutional Stay (SKILLED_NURSING_FACILITY): Payer: PPO | Admitting: Gerontology

## 2017-05-23 DIAGNOSIS — R600 Localized edema: Secondary | ICD-10-CM | POA: Diagnosis not present

## 2017-05-23 DIAGNOSIS — M1712 Unilateral primary osteoarthritis, left knee: Secondary | ICD-10-CM

## 2017-05-23 DIAGNOSIS — Z96652 Presence of left artificial knee joint: Secondary | ICD-10-CM | POA: Diagnosis not present

## 2017-05-24 ENCOUNTER — Encounter: Payer: Self-pay | Admitting: Gerontology

## 2017-05-24 NOTE — Progress Notes (Signed)
Location:   [facility] Room Number: 696V Place of Service:  SNF [phone]) Provider:  Toni Arthurs, NP-C  Leeroy Cha, MD  Patient Care Team: Leeroy Cha, MD as PCP - General (Internal Medicine) Patient, No Pcp Per (General Practice)  Extended Emergency Contact Information Primary Emergency Contact: Juel Burrow [place] of [place] Mobile Phone: [phone] Relation: Daughter Secondary Emergency Contact: Para March, [place] [address] Johnnette Litter of [place] Phone: [phone] Relation: Daughter  Code Status:  FULL Goals of care: Advanced Directive information Advanced Directives 05/24/2017  Does Patient Have a Medical Advance Directive? Yes  Type of Advance Directive -  Does patient want to make changes to medical advance directive? No - Patient declined  Copy of [place] in Chart? -  Would patient like information on creating a medical advance directive? -  Pre-existing out of facility DNR order (yellow form or pink MOST form) -     Chief Complaint  Patient presents with  . Acute Visit    Recheck edema    HPI:  Pt is a 76 y.o. female seen today for an acute visit for evaluation of edema of the left lower extremity.  Patient was admitted to the facility for rehab status post left total knee replacement.  She is been participating in PT/OT.  She reports her pain is generally well controlled on current regimen.  However, she began having increased swelling/edema of the left leg, causing worsening discomfort and concern.  Calf is tender to palpation.  Mild warmth.  No redness.  2+ edema to the left lower extremity.  Incision is well approximated.  No peri-incisional redness or drainage.  Patient denies chest pain or shortness of breath.  Appetite is good, voiding well, having regular BMs.  Vital signs stable.  No other complaints.   Past Medical History:  Diagnosis Date  . Acid  reflux   . Anemia   . Anxiety    in past  . Arthritis    all over  . Atypical moles   . Bronchitis   . CAD (coronary artery disease)    patient denies on preop of 10/24   . Carotid artery calcification, bilateral 04/2016  . Cataract   . Complication of anesthesia    shoulder surgery- not all the way asleep  . Depression    in past  . Dizziness   . Family history of adverse reaction to anesthesia    sister- has problems with nausea and vomiting   . Fatigue   . Generalized headache    migraines  . GERD (gastroesophageal reflux disease)   . History of chemotherapy    topical cream for h/o skin CA  . History of kidney stones   . Hypothyroidism   . Insomnia   . Irritable bowel syndrome   . LLQ abdominal pain   . Melanoma ([place]) 2013  . Melanoma ([place])    x2 stg II melanoma  . Neuropathy, peripheral   . Peripheral neuropathy   . Pneumonia    hx of x 2  . PONV (postoperative nausea and vomiting)   . Pre-diabetes   . Retina disorder    left  . Stroke [facility])    ? TIA per Dr V per patient   . Thyroid disease   . Weight gain    Past Surgical History:  Procedure Laterality Date  . CLOSED MANIPULATION SHOULDER     rt  .  colonscopy     May 2014  . JOINT REPLACEMENT     RTK  . KNEE ARTHROSCOPY    . left knee miniscus tear repair    . MELANOMA EXCISION WITH SENTINEL LYMPH NODE BIOPSY  03/18/11   Back, nodes neg  . ROTATOR CUFF REPAIR     left  . SHOULDER SURGERY     right  . TOTAL KNEE ARTHROPLASTY     right  . TUMOR REMOVAL     ovary    Allergies  Allergen Reactions  . Epinephrine Other (See Comments) and Anaphylaxis    Heart palpitations  . Penicillins Swelling and Other (See Comments)    Lip swelling Has patient had a PCN reaction causing immediate rash, facial/tongue/throat swelling, SOB or lightheadedness with hypotension: Yes Has patient had a PCN reaction causing severe rash involving mucus membranes or skin necrosis: No Has patient had a PCN reaction  that required hospitalization No Has patient had a PCN reaction occurring within the last 10 years: No If all of the above answers are "NO", then may proceed with Cephalosporin use.  . Other     Black pepper- caused bronchial tubes to start closing   . Valium [Diazepam] Other (See Comments)    Other reaction(s): Other (See Comments) Overly sensitive  sedation    Allergies as of 05/23/2017      Reactions   Epinephrine Other (See Comments), Anaphylaxis   Heart palpitations   Penicillins Swelling, Other (See Comments)   Lip swelling Has patient had a PCN reaction causing immediate rash, facial/tongue/throat swelling, SOB or lightheadedness with hypotension: Yes Has patient had a PCN reaction causing severe rash involving mucus membranes or skin necrosis: No Has patient had a PCN reaction that required hospitalization No Has patient had a PCN reaction occurring within the last 10 years: No If all of the above answers are "NO", then may proceed with Cephalosporin use.   Other    Black pepper- caused bronchial tubes to start closing    Valium [diazepam] Other (See Comments)   Other reaction(s): Other (See Comments) Overly sensitive  sedation      Medication List        Accurate as of 05/23/17 11:59 PM. Always use your most recent med list.          albuterol 108 (90 Base) MCG/ACT inhaler Commonly known as:  PROVENTIL HFA;VENTOLIN HFA Inhale 1-2 puffs into the lungs every 6 (six) hours as needed for wheezing or shortness of breath.   aspirin 325 MG EC tablet Take 325 mg 2 (two) times daily by mouth.   atorvastatin 10 MG tablet Commonly known as:  LIPITOR Take 10 mg daily by mouth.   Biotin 1000 MCG tablet Take 1,000 mcg by mouth daily.   Calcium 600-200 MG-UNIT tablet Take 1 tablet daily by mouth.   dextromethorphan-guaiFENesin 30-600 MG 12hr tablet Commonly known as:  MUCINEX DM Take 1 tablet 2 (two) times daily by mouth.   docusate sodium 100 MG capsule Commonly  known as:  COLACE Take 1 capsule (100 mg total) by mouth 2 (two) times daily.   ferrous sulfate 325 (65 FE) MG tablet Commonly known as:  FERROUSUL Take 1 tablet (325 mg total) by mouth 3 (three) times daily with meals.   Fish Oil 1000 MG Caps Take 1,000 mg by mouth daily.   folic acid 277 MCG tablet Commonly known as:  FOLVITE Take 800 mcg by mouth daily.   HYDROcodone-acetaminophen 7.5-325 MG tablet Commonly known  as:  NORCO Take 1-2 tablets by mouth every 4 (four) hours as needed for moderate pain or severe pain.   levothyroxine 50 MCG tablet Commonly known as:  SYNTHROID Take 1 tablet (50 mcg total) by mouth daily before breakfast.   methocarbamol 500 MG tablet Commonly known as:  ROBAXIN Take 1 tablet (500 mg total) by mouth every 6 (six) hours as needed for muscle spasms.   omeprazole 20 MG capsule Commonly known as:  PRILOSEC Take 1 capsule (20 mg total) by mouth daily.   polyethylene glycol packet Commonly known as:  MIRALAX / GLYCOLAX Take 17 g by mouth 2 (two) times daily.   PRESERVISION AREDS 2 PO Take 1 capsule by mouth daily.   senna 8.6 MG Tabs tablet Commonly known as:  SENOKOT Take 2 tablets 2 (two) times daily by mouth.   sertraline 25 MG tablet Commonly known as:  ZOLOFT Take 25 mg daily by mouth.   vitamin B-12 1000 MCG tablet Commonly known as:  CYANOCOBALAMIN Take 2,000 mcg daily by mouth. 2 tabs   vitamin C 1000 MG tablet Take 1,000 mg by mouth daily.   Vitamin D3 2000 units capsule Take 2,000 Units by mouth daily.       Review of Systems  Constitutional: Negative for activity change, appetite change, chills, diaphoresis and fever.  HENT: Negative for congestion, sneezing, sore throat, trouble swallowing and voice change.   Respiratory: Negative for apnea, cough, choking, chest tightness, shortness of breath and wheezing.   Cardiovascular: Positive for leg swelling. Negative for chest pain and palpitations.  Gastrointestinal:  Negative for abdominal distention, abdominal pain, constipation, diarrhea and nausea.  Genitourinary: Negative for difficulty urinating, dysuria, frequency and urgency.  Musculoskeletal: Positive for arthralgias (typical arthritis). Negative for back pain, gait problem and myalgias.  Skin: Positive for wound. Negative for color change, pallor and rash.  Neurological: Negative for dizziness, tremors, syncope, speech difficulty, weakness, numbness and headaches.  Psychiatric/Behavioral: Negative for agitation and behavioral problems.  All other systems reviewed and are negative.   Immunization History  Administered Date(s) Administered  . Influenza Split 04/18/2017   Pertinent  Health Maintenance Due  Topic Date Due  . DEXA SCAN  12/12/2005  . PNA vac Low Risk Adult (1 of 2 - PCV13) 12/12/2005  . INFLUENZA VACCINE  Completed   Fall Risk  10/31/2016 04/27/2016 12/17/2015 09/07/2015 05/11/2015  Falls in the past year? No No No No No   Functional Status Survey:    Vitals:   05/23/17 1128  BP: (!) 144/79  Pulse: 76  Resp: 18  Temp: 98.1 F (36.7 C)  TempSrc: Oral  SpO2: 94%  Weight: 186 lb 1.1 oz (84.4 kg)  Height: 5\' 7"  (1.702 m)   Body mass index is 29.14 kg/m. Physical Exam  Constitutional: She is oriented to person, place, and time. Vital signs are normal. She appears well-developed and well-nourished. She is active and cooperative. She does not appear ill. No distress.  HENT:  Head: Normocephalic and atraumatic.  Mouth/Throat: Uvula is midline, oropharynx is clear and moist and mucous membranes are normal. Mucous membranes are not pale, not dry and not cyanotic.  Eyes: Conjunctivae, EOM and lids are normal. Pupils are equal, round, and reactive to light.  Neck: Trachea normal, normal range of motion and full passive range of motion without pain. Neck supple. No JVD present. No tracheal deviation, no edema and no erythema present. No thyromegaly present.  Cardiovascular:  Normal rate, regular rhythm, normal heart sounds, intact  distal pulses and normal pulses. Exam reveals no gallop, no distant heart sounds and no friction rub.  No murmur heard. Pulses:      Dorsalis pedis pulses are 2+ on the right side, and 2+ on the left side.  2+ pitting edema left lower extremity  Pulmonary/Chest: Effort normal and breath sounds normal. No accessory muscle usage. No respiratory distress. She has no decreased breath sounds. She has no wheezes. She has no rhonchi. She has no rales. She exhibits no tenderness.  Abdominal: Soft. Normal appearance and bowel sounds are normal. She exhibits no distension and no ascites. There is no tenderness.  Musculoskeletal: She exhibits no edema.       Left knee: She exhibits decreased range of motion, swelling and laceration. Tenderness found.  Expected osteoarthritis, stiffness  Neurological: She is alert and oriented to person, place, and time. She has normal strength.  Skin: Skin is warm and dry. Laceration noted. She is not diaphoretic. No cyanosis. No pallor. Nails show no clubbing.  Psychiatric: She has a normal mood and affect. Her speech is normal and behavior is normal. Judgment and thought content normal. Cognition and memory are normal.  Nursing note and vitals reviewed.   Labs reviewed: Recent Labs    05/10/17 1130 05/17/17 0514 05/18/17 0521  NA 140 139 139  K 4.8 4.1 4.2  CL 105 106 105  CO2 27 24 27   GLUCOSE 97 144* 143*  BUN 21* 19 23*  CREATININE 0.79 0.79 0.90  CALCIUM 8.9 8.3* 8.1*   No results for input(s): AST, ALT, ALKPHOS, BILITOT, PROT, ALBUMIN in the last 8760 hours. Recent Labs    05/10/17 1030 05/17/17 0514 05/18/17 0521  WBC 6.5 12.3* 11.9*  HGB 14.0 11.8* 10.8*  HCT 42.3 35.7* 33.2*  MCV 91.2 90.6 92.7  PLT 200 184 185   Lab Results  Component Value Date   TSH 6.663 (H) 08/09/2015   Lab Results  Component Value Date   HGBA1C 5.9 (H) 05/10/2017   Lab Results  Component Value Date    CHOL 181 12/22/2014   HDL 57 12/22/2014   LDLCALC 108 (H) 12/22/2014   TRIG 79 12/22/2014   CHOLHDL 3.2 12/22/2014    Significant Diagnostic Results in last 30 days:  No results found.  Assessment/Plan 1.  Primary osteoarthritis of left knee 2.  Status post left total knee arthroplasty  Continue PT/OT  Continue exercises as taught by PT/OT  Continue ice pack/Polar Care to the left knee as needed for pain/edema  Continue aspirin 325 mg p.o. twice daily for DVT prophylaxis  Skin/incision care per protocol  Continue hydrocodone/APAP 7.5/325 mg 1-2 tablets p.o. every 4 hours as needed pain  Continue methocarbamol 500 mg 1 tablet p.o. every 6 hours as needed spasms or cramps  Follow-up with orthopedist as instructed  2.  Localized edema  Left lower extremity duplex Doppler to rule out DVT  If negative, bilateral thigh-high TED hose.  On in the early a.m., off in the p.m.  Elevate legs when at rest  Family/ staff Communication:   Total Time:  Documentation:  Face to Face:  Family/Phone:   Labs/tests ordered: Left lower extremity duplex Doppler  Medication list reviewed and assessed for continued appropriateness.  Vikki Ports, NP-C Geriatrics [facility] [phone], [place] [address] Cell Phone (Mon-Fri 8am-5pm):  [phone] On Call:  [phone] & follow prompts after 5pm & weekends Office Phone:  [phone] Office Fax:  [phone]

## 2017-05-29 DIAGNOSIS — Z96652 Presence of left artificial knee joint: Secondary | ICD-10-CM | POA: Diagnosis not present

## 2017-05-29 DIAGNOSIS — Z471 Aftercare following joint replacement surgery: Secondary | ICD-10-CM | POA: Diagnosis not present

## 2017-05-30 ENCOUNTER — Other Ambulatory Visit: Payer: Self-pay | Admitting: *Deleted

## 2017-05-30 ENCOUNTER — Other Ambulatory Visit: Payer: Self-pay

## 2017-05-30 ENCOUNTER — Encounter: Payer: Self-pay | Admitting: *Deleted

## 2017-05-30 DIAGNOSIS — F419 Anxiety disorder, unspecified: Secondary | ICD-10-CM | POA: Diagnosis not present

## 2017-05-30 DIAGNOSIS — I2510 Atherosclerotic heart disease of native coronary artery without angina pectoris: Secondary | ICD-10-CM | POA: Diagnosis not present

## 2017-05-30 DIAGNOSIS — R7303 Prediabetes: Secondary | ICD-10-CM | POA: Diagnosis not present

## 2017-05-30 DIAGNOSIS — E039 Hypothyroidism, unspecified: Secondary | ICD-10-CM | POA: Diagnosis not present

## 2017-05-30 DIAGNOSIS — D649 Anemia, unspecified: Secondary | ICD-10-CM | POA: Diagnosis not present

## 2017-05-30 DIAGNOSIS — K219 Gastro-esophageal reflux disease without esophagitis: Secondary | ICD-10-CM | POA: Diagnosis not present

## 2017-05-30 DIAGNOSIS — F329 Major depressive disorder, single episode, unspecified: Secondary | ICD-10-CM | POA: Diagnosis not present

## 2017-05-30 DIAGNOSIS — K589 Irritable bowel syndrome without diarrhea: Secondary | ICD-10-CM | POA: Diagnosis not present

## 2017-05-30 DIAGNOSIS — Z471 Aftercare following joint replacement surgery: Secondary | ICD-10-CM | POA: Diagnosis not present

## 2017-05-30 DIAGNOSIS — R42 Dizziness and giddiness: Secondary | ICD-10-CM | POA: Diagnosis not present

## 2017-05-30 DIAGNOSIS — M1991 Primary osteoarthritis, unspecified site: Secondary | ICD-10-CM | POA: Diagnosis not present

## 2017-05-30 DIAGNOSIS — H359 Unspecified retinal disorder: Secondary | ICD-10-CM | POA: Diagnosis not present

## 2017-05-30 DIAGNOSIS — G629 Polyneuropathy, unspecified: Secondary | ICD-10-CM | POA: Diagnosis not present

## 2017-05-30 NOTE — Patient Outreach (Signed)
[name]) Care Management  05/30/2017  [name] [birth year] [id number]   Return phone call from patient to discuss transportation needs.Oer patient, she is receiving HH PT, however her doctor is recommending outpatient rehab twice a week. Patient states that she has family and friends, however has always been independent and does not want ti impose on family and friends.   Patient states that she has contacted [employer], however she lives outside of their coverage area. [place] discussed, [phone] as well as other commercial transportation companies like [place] or [place].  Patient states that she will consider requesting assistance from friends due to her motivation to becoming active again.  Patient verbalized having no additional community resource needs. Patient to be closed to social work at this time.   [name] [facility] Care Management [phone]

## 2017-05-30 NOTE — Patient Outreach (Signed)
[facility]) Care Management  05/30/2017  [name] March 13, 1941 [id number]   Referral received on 05/30/17 by telephonic RN to assist patient with community resources for transportation. Patient answered the phone, however was sitting down to eat her lunch and requested that she call this social work back after she eats.   Plan:Patient will call this social worker back after she eats her lunch to discuss transportation needs.    [name] [facility] Care Management [phone]

## 2017-05-30 NOTE — Patient Outreach (Signed)
[facility]) Care Management  05/30/2017  [name] November 01, 1940 [id number]    Transition of Care Referral  Referral Date:  05/30/17 Referral Source: HTA Discharge Report Date of Admission: unknown Diagnosis: left total knee arthroplasty Date of Discharge: 05/28/17 Facility: [facility] attempt # 1 to patient. Spoke with patient. She voices that she is doing much better today. Yesterday she tried to go without taking her pain meds as often and she had a serious pain episode. RN CM educated patient on importance of adequate pain mgmt and staying on top of pain. Patient voiced understanding. She reports that she went for her surgeon f/u appt and tat is here she had the pain incident. They thought she had possibly injured herself unknowingly and did an x-ray. Results received on further injuries. Patient reports that HHPT is coming today. However, surgeon really wanted and highly reccommended dot her to go to outpatient therapy as she needed more aggressive therapy to aide in recovery. She reports that she is unable to drive and has no transportation.Patient states she has all her meds. She has question about rather or not she should continue iron med for anemia as she does not think she is anemic. RN CM encouraged patient to fu with PCP regarding this matter. She voices she has not contacted PCP office to advise them of her recent admission. RN CM encouraged patient to do and she stated she would follow up with MD. She voices no further RN CM needs or concerns. She was appreciative of call.     Plan: RN CM will notify [facility] administrative assistant of case status. RN CM will send [facility] SW referral for possible transportation assistance.     Enzo Montgomery, RN,BSN,CCM [place] Management Telephonic Care Management Coordinator Direct Phone: [phone] Toll Free: [phone] Fax: [phone]

## 2017-05-31 NOTE — Telephone Encounter (Signed)
This encounter was created in error - please disregard.  This encounter was created in error - please disregard.

## 2017-06-05 DIAGNOSIS — H359 Unspecified retinal disorder: Secondary | ICD-10-CM | POA: Diagnosis not present

## 2017-06-05 DIAGNOSIS — E039 Hypothyroidism, unspecified: Secondary | ICD-10-CM | POA: Diagnosis not present

## 2017-06-05 DIAGNOSIS — M1991 Primary osteoarthritis, unspecified site: Secondary | ICD-10-CM | POA: Diagnosis not present

## 2017-06-05 DIAGNOSIS — G629 Polyneuropathy, unspecified: Secondary | ICD-10-CM | POA: Diagnosis not present

## 2017-06-05 DIAGNOSIS — Z471 Aftercare following joint replacement surgery: Secondary | ICD-10-CM | POA: Diagnosis not present

## 2017-06-05 DIAGNOSIS — R42 Dizziness and giddiness: Secondary | ICD-10-CM | POA: Diagnosis not present

## 2017-06-05 DIAGNOSIS — K219 Gastro-esophageal reflux disease without esophagitis: Secondary | ICD-10-CM | POA: Diagnosis not present

## 2017-06-05 DIAGNOSIS — K589 Irritable bowel syndrome without diarrhea: Secondary | ICD-10-CM | POA: Diagnosis not present

## 2017-06-05 DIAGNOSIS — R7303 Prediabetes: Secondary | ICD-10-CM | POA: Diagnosis not present

## 2017-06-05 DIAGNOSIS — D649 Anemia, unspecified: Secondary | ICD-10-CM | POA: Diagnosis not present

## 2017-06-05 DIAGNOSIS — F329 Major depressive disorder, single episode, unspecified: Secondary | ICD-10-CM | POA: Diagnosis not present

## 2017-06-05 DIAGNOSIS — F419 Anxiety disorder, unspecified: Secondary | ICD-10-CM | POA: Diagnosis not present

## 2017-06-05 DIAGNOSIS — I2510 Atherosclerotic heart disease of native coronary artery without angina pectoris: Secondary | ICD-10-CM | POA: Diagnosis not present

## 2017-06-06 DIAGNOSIS — D509 Iron deficiency anemia, unspecified: Secondary | ICD-10-CM | POA: Diagnosis not present

## 2017-06-06 DIAGNOSIS — Z96649 Presence of unspecified artificial hip joint: Secondary | ICD-10-CM | POA: Diagnosis not present

## 2017-06-14 ENCOUNTER — Ambulatory Visit: Payer: PPO | Attending: Orthopedic Surgery

## 2017-06-14 DIAGNOSIS — M6281 Muscle weakness (generalized): Secondary | ICD-10-CM | POA: Diagnosis not present

## 2017-06-14 DIAGNOSIS — R2689 Other abnormalities of gait and mobility: Secondary | ICD-10-CM | POA: Diagnosis not present

## 2017-06-14 DIAGNOSIS — M25662 Stiffness of left knee, not elsewhere classified: Secondary | ICD-10-CM | POA: Diagnosis not present

## 2017-06-14 NOTE — Therapy (Signed)
[place] MAIN [facility] SERVICES [address] [place], [place], [address] Phone: [phone]   Fax:  [phone]  Physical Therapy Treatment  Patient Details  Name: [name] MRN: [id number] Date of Birth: February 17, 1941 Referring Provider: Mauri Pole , MD   Encounter Date: 06/14/2017  PT End of Session - 06/14/17 1054    Visit Number  1    Number of Visits  8    Date for PT Re-Evaluation  07/12/17    Authorization Type  1    Authorization Time Period  10    PT Start Time  1002    PT Stop Time  1101    PT Time Calculation (min)  59 min    Equipment Utilized During Treatment  Gait belt    Activity Tolerance  Patient tolerated treatment well;Patient limited by pain    Behavior During Therapy  [facility] for tasks assessed/performed       Past Medical History:  Diagnosis Date  . Acid reflux   . Anemia   . Anxiety    in past  . Arthritis    all over  . Atypical moles   . Bronchitis   . CAD (coronary artery disease)    patient denies on preop of 10/24   . Carotid artery calcification, bilateral 04/2016  . Cataract   . Complication of anesthesia    shoulder surgery- not all the way asleep  . Depression    in past  . Dizziness   . Family history of adverse reaction to anesthesia    sister- has problems with nausea and vomiting   . Fatigue   . Generalized headache    migraines  . GERD (gastroesophageal reflux disease)   . History of chemotherapy    topical cream for h/o skin CA  . History of kidney stones   . Hypothyroidism   . Insomnia   . Irritable bowel syndrome   . LLQ abdominal pain   . Melanoma ([place]) 2013  . Melanoma ([place])    x2 stg II melanoma  . Neuropathy, peripheral   . Peripheral neuropathy   . Pneumonia    hx of x 2  . PONV (postoperative nausea and vomiting)   . Pre-diabetes   . Retina disorder    left  . Stroke [facility])    ? TIA per Dr V per patient   . Thyroid disease   . Weight gain     Past  Surgical History:  Procedure Laterality Date  . CHOLECYSTECTOMY N/A 01/28/2015   Procedure: LAPAROSCOPIC CHOLECYSTECTOMY WITH INTRAOPERATIVE CHOLANGIOGRAM;  Surgeon: Autumn Messing III, MD;  Location: [place];  Service: General;  Laterality: N/A;  . CLOSED MANIPULATION SHOULDER     rt  . COLONOSCOPY WITH PROPOFOL N/A 11/20/2012   Procedure: COLONOSCOPY WITH PROPOFOL;  Surgeon: Garlan Fair, MD;  Location: [facility];  Service: Endoscopy;  Laterality: N/A;  . colonscopy     May 2014  . JOINT REPLACEMENT     RTK  . KNEE ARTHROSCOPY    . left knee miniscus tear repair    . MELANOMA EXCISION WITH SENTINEL LYMPH NODE BIOPSY  03/18/11   Back, nodes neg  . ROTATOR CUFF REPAIR     left  . SHOULDER SURGERY     right  . TOTAL KNEE ARTHROPLASTY     right  . TOTAL KNEE ARTHROPLASTY Left 05/16/2017   Procedure: LEFT TOTAL KNEE ARTHROPLASTY;  Surgeon: Paralee Cancel, MD;  Location: [facility];  Service: Orthopedics;  Laterality: Left;  70 mins  . TUMOR REMOVAL     [place]    There were no vitals filed for this visit.  Subjective Assessment - 06/14/17 1021    Subjective  Patient is a pleasant 76 year old female who presents to physical therpay s/p LTKA on 05/16/17.     Pertinent History  76 yo s/p LTKA with h/o vestibular problems, RTKA several years ago, back pain shoulder surgery back in 1990s, peripheral neuropathy, melanoma, migraines.  Patient had surgery October 30th with no noted complications. Went to [facility] for 10 days. Went home with home health PT, released last Friday. Doctor wants more aggressive therapy two times a week so referred to PT. Pt. Reports icing several times a day.     Limitations  Lifting;Standing;Walking;House hold activities;Other (comment)    How long can you stand comfortably?  5 minutes    How long can you walk comfortably?  needs AD, 5 minutes    Patient Stated Goals  to return to previous level of function    Currently in Pain?  Yes    Pain Score  4     Pain Location   Knee    Pain Orientation  Left    Pain Descriptors / Indicators  Aching;Tightness    Pain Type  Surgical pain    Pain Radiating Towards  shoots down towards ankle and up towards groin     Pain Onset  1 to 4 weeks ago    Pain Frequency  Intermittent    Aggravating Factors   exercise    Pain Relieving Factors  pain medication     Effect of Pain on Daily Activities  limits all LE movement        PAIN: Current pain: stiffness: 5/10 Worst pain:8/10 Best: 3/10  POSTURE: Decreased weight shift over LLE.   PROM/AROM:  AROM SEATED L knee flexion: 96 PROM L knee flexion: 103  AROM SUPINE L knee extension: -15 L knee flexion : 104  PROM SUPINE L knee extension: -15 L knee flexion 105  STRENGTH:  Graded on a 0-5 scale Muscle Group Left Right  Hip Flex 4-/5 4/5  Hip Abd 4-/5 4/5  Hip Add 3/5 4/5  Hip Ext 3+/5 4/5  Hip IR/ER    Knee Flex 2+/5 4/5  Knee Ext 2+/5 4/5  Ankle DF 4-/5 4/5  Ankle PF 4-/5 4/5   SENSATION: L sensation limited in thigh : lateral and medial  SPECIAL TESTS: circumferential area:  R 29 L 33  FUNCTIONAL MOBILITY: STS with weight shift over LLE.  Back pain limits positions, difficulty having knee flexed while in supine  BALANCE: Require cane for short ambulation, walker for longer distances  GAIT: Decreased weight acceptance on LLE, decreased hip and knee flexion standing, unable to fully extend upon push off phase of gait.   OUTCOME MEASURES: TEST Outcome Interpretation  5 times sit<>stand 18 sec  With slight increase in weight shift to RLE  >52 yo, >15 sec indicates increased risk for falls  10 meter walk test     .714            m/s <1.0 m/s indicates increased risk for falls; limited community ambulator  LEFS 39/80   ABC 36.9%                [facility] PT Assessment - 06/14/17 0001      Assessment   Medical Diagnosis  L TKA  Referring Provider  Mauri Pole , MD    Onset Date/Surgical Date  05/16/17    Hand Dominance   Left    Next MD Visit  06/29/17    Prior Therapy  yes      Precautions   Precautions  None      Restrictions   Weight Bearing Restrictions  No      Balance Screen   Has the patient fallen in the past 6 months  No    Has the patient had a decrease in activity level because of a fear of falling?   Yes    Is the patient reluctant to leave their home because of a fear of falling?   Yes      [place]  Private residence    Living Arrangements  Alone    Available Help at Discharge  Neighbor    Type of Home  Other(Comment) [place] entry    [place] - standard;Cane - single point;Toilet riser;Shower seat;Grab bars - tub/shower      Prior Function   Level of Independence  Independent    Vocation  Retired    Leisure  enjoy reading, walking, church, gathering with friends, dance      Cognition   Overall Cognitive Status  Within Functional Limits for tasks assessed      Observation/Other Assessments   Observations  warmth, slight edema, swelling, of L knee      Observation/Other Assessments-Edema    Edema  Circumferential      Circumferential Edema   Circumferential - Right  29 cm    Circumferential - Left   33 cm      Sensation   Light Touch  Impaired by gross assessment    Additional Comments  L thigh impaired      Coordination   Gross Motor Movements are Fluid and Coordinated  No      Posture/Postural Control   Posture/Postural Control  Postural limitations    Postural Limitations  Rounded Shoulders;Forward head;Weight shift right      Transfers   Transfers  Independent with all Transfers      Ambulation/Gait   Ambulation/Gait  Yes    Ambulation/Gait Assistance  6: Modified independent (Device/Increase time)    Assistive device  Straight cane    Gait Pattern  Decreased stance time - left;Decreased hip/knee flexion - left;Decreased weight shift to left;Left flexed knee in  stance    Ambulation Surface  Level;Indoor    Gait velocity  .28m/s                        PT Education - 06/14/17 1054    Education provided  Yes    Education Details  POC, continution of home health HEP, call doctor to report swelling     Person(s) Educated  Patient    Methods  Explanation    Comprehension  Verbalized understanding       PT Short Term Goals - 06/14/17 1129      PT SHORT TERM GOAL #1   Title  Patient will be independent in home exercise program to improve strength/mobility for better functional independence with ADLs.    Baseline  HEP modifications    Time  2    Period  Weeks    Status  New    Target Date  06/28/17  PT SHORT TERM GOAL #2   Title  Pt. will increase L knee flexion to 112 degrees, and extension to -10 to improve ambulatory body mechanics    Baseline  L knee flexion: 106, extension -15    Time  2    Period  Weeks    Status  New    Target Date  06/28/17      PT SHORT TERM GOAL #3   Title  Patient will ambulate with SPC in public to impove mobility with least assistive device.     Baseline  ambulate with walker in public    Time  2    Period  Weeks    Status  New    Target Date  06/28/17        PT Long Term Goals - 06/14/17 1133      PT LONG TERM GOAL #1   Title  Patient will improve L knee flexion to 120 degrees and extension to neutral to increase ambulatory body mechanics.     Baseline  11/28: flexion 106, extension -15    Time  4    Period  Weeks    Status  New    Target Date  07/12/17      PT LONG TERM GOAL #2   Title  Patient (> 48 years old) will complete five times sit to stand test in < 15 seconds indicating an increased LE strength and improved balance    Baseline  11/28 : 18 seconds with poor weight shift    Time  4    Period  Weeks    Target Date  07/12/17      PT LONG TERM GOAL #3   Title  Patient will increase 10 meter walk test to >1.72m/s as to improve gait speed for better community  ambulation and to reduce fall risk.    Baseline  11/28: .38m/s    Time  4    Period  Weeks    Status  New    Target Date  07/12/17      PT LONG TERM GOAL #4   Title  Patient will increase lower extremity functional scale to >60/80 to demonstrate improved functional mobility and increased tolerance with ADLs.     Baseline  11/18: 39/80    Time  4    Period  Weeks    Status  New    Target Date  07/12/17      PT LONG TERM GOAL #5   Title  Patient will increase ABC scale score >80% to demonstrate better functional mobility and better confidence with ADLs.     Baseline  11/28: 36.9 %    Time  4    Period  Weeks    Status  New    Target Date  07/12/17            Plan - 06/14/17 1123    Clinical Impression Statement  Patient is a pleasant 76 year old female who presents s/p LTKA on 05/16/17. Pt. Has a history of RTKA, depression, CAD, GERD, hypothyroidism, neuropathy, stroke, and back pain. L knee presents with warmth, slight redness, swelling of calf and edema of calf. Pt. Educated on awareness of DVT's and to call doctor to be aware of changes. L knee mobility is limited in both flexion and extension with flexion 106 and extension -15, Arom more limited than PROM. Patient strength is limited on LLE and RLE. Gait pattern affected by limited weight shift over LLE and  decreased push off. 5x STS=18 seconds without UE assistance and weight shift over RLE, 10MWT=.30m/s, LEFS: 39/80, ABC=36.9%. Patient will benefit from skilled physical therapy to address above mentioned deficits to improve L knee ROM, strength, and mobility for improved quality of life and return to prior level of function.     History and Personal Factors relevant to plan of care:  This patient presents with  1- 2, personal factors/ comorbidities and 3   body elements including body structures and functions, activity limitations and or participation restrictions. Patient's condition is evolving.    Clinical Presentation   Evolving    Clinical Presentation due to:  progressive ROM, edema, swelling    Clinical Decision Making  Low    Rehab Potential  Good    Clinical Impairments Affecting Rehab Potential  (+) motivated, not first knee replacement; (-) lives alone, swelling, age, back pain    PT Frequency  2x / week    PT Duration  4 weeks    PT Treatment/Interventions  Electrical Stimulation;Patient/family education;Cryotherapy;Moist Heat;Neuromuscular re-education;Therapeutic exercise;Manual techniques;ADLs/Self Care Home Management;Ultrasound;DME Instruction;Gait training;Stair training;Functional mobility training;Therapeutic activities;Balance training;Compression bandaging;Taping;Energy conservation;Dry needling;Passive range of motion;Scar mobilization    PT Next Visit Plan  knee ROM and strength, HEP    PT Home Exercise Plan  continue home health, modify next session    Consulted and Agree with Plan of Care  Patient       Patient will benefit from skilled therapeutic intervention in order to improve the following deficits and impairments:  Decreased strength, Decreased activity tolerance, Impaired perceived functional ability, Pain, Decreased endurance, Decreased range of motion, Difficulty walking, Abnormal gait, Decreased balance, Decreased knowledge of use of DME, Decreased mobility, Decreased scar mobility, Hypomobility, Increased edema, Impaired flexibility, Impaired sensation, Postural dysfunction, Improper body mechanics  Visit Diagnosis: Stiffness of left knee, not elsewhere classified  Other abnormalities of gait and mobility  Muscle weakness (generalized)   G-Codes - July 13, 2017 1140    Functional Assessment Tool Used (Outpatient Only)  LEFS, pain scale, ABC, gait mechanics ROM, strength, clinical judgment    Functional Limitation  Mobility: Walking and moving around    Mobility: Walking and Moving Around Current Status ([id number])  At least 40 percent but less than 60 percent impaired, limited or  restricted    Mobility: Walking and Moving Around Goal Status [phone])  At least 1 percent but less than 20 percent impaired, limited or restricted       Problem List Patient Active Problem List   Diagnosis Date Noted  . S/P left TKA 05/16/2017  . S/P total knee replacement 05/16/2017  . Female stress incontinence 05/08/2017  . Pain in pelvis 05/08/2017  . Coronary artery calcification seen on CAT scan 04/09/2017  . Carotid stenosis, asymptomatic, bilateral 04/09/2017  . Mixed hyperlipidemia 04/09/2017  . MCI (mild cognitive impairment) 12/17/2015  . Urinary frequency 08/09/2015  . Hypothyroidism 08/09/2015  . Syncope 08/08/2015  . Posterior vitreous detachment of right eye 07/23/2015  . Hereditary and idiopathic peripheral neuropathy 05/11/2015  . Acute pancreatitis 12/21/2014  . Gallstones 12/21/2014  . Abdominal pain 12/21/2014  . Acute bronchitis due to infection 07/04/2014  . Epiretinal membrane, left 04/12/2012  . Nuclear cataract 04/12/2012  . Left lower quadrant pain 08/16/2011  . Breast pain in female 07/18/2011  . Melanoma of back ([place]) 02/28/2011   Janna Arch, PT, DPT   Janna Arch 2017/07/13, 11:40 AM  [place] MAIN [place] [address] [place], [place], [address]  Phone: [phone]   Fax:  [phone]  Name: [name] MRN: [id number] Date of Birth: 1940/09/29

## 2017-06-20 ENCOUNTER — Ambulatory Visit: Payer: PPO | Attending: Orthopedic Surgery

## 2017-06-20 DIAGNOSIS — M6281 Muscle weakness (generalized): Secondary | ICD-10-CM

## 2017-06-20 DIAGNOSIS — G8929 Other chronic pain: Secondary | ICD-10-CM | POA: Diagnosis not present

## 2017-06-20 DIAGNOSIS — R2689 Other abnormalities of gait and mobility: Secondary | ICD-10-CM | POA: Diagnosis not present

## 2017-06-20 DIAGNOSIS — M25662 Stiffness of left knee, not elsewhere classified: Secondary | ICD-10-CM | POA: Diagnosis not present

## 2017-06-20 DIAGNOSIS — M25562 Pain in left knee: Secondary | ICD-10-CM | POA: Diagnosis not present

## 2017-06-20 NOTE — Therapy (Signed)
[place] MAIN [facility] SERVICES [address] [place], [place], [address] Phone: [phone]   Fax:  [phone]  Physical Therapy Treatment  Patient Details  Name: [name] MRN: [id number] Date of Birth: [birth year] Referring Provider: Mauri Pole , MD   Encounter Date: 06/20/2017  PT End of Session - 06/20/17 1706    Visit Number  2    Number of Visits  8    Date for PT Re-Evaluation  07/12/17    Authorization Type  2    Authorization Time Period  10    PT Start Time  1615    PT Stop Time  1701    PT Time Calculation (min)  46 min    Equipment Utilized During Treatment  Gait belt    Activity Tolerance  Patient tolerated treatment well;Patient limited by pain    Behavior During Therapy  [facility] for tasks assessed/performed       Past Medical History:  Diagnosis Date  . Acid reflux   . Anemia   . Anxiety    in past  . Arthritis    all over  . Atypical moles   . Bronchitis   . CAD (coronary artery disease)    patient denies on preop of 10/24   . Carotid artery calcification, bilateral 04/2016  . Cataract   . Complication of anesthesia    shoulder surgery- not all the way asleep  . Depression    in past  . Dizziness   . Family history of adverse reaction to anesthesia    sister- has problems with nausea and vomiting   . Fatigue   . Generalized headache    migraines  . GERD (gastroesophageal reflux disease)   . History of chemotherapy    topical cream for h/o skin CA  . History of kidney stones   . Hypothyroidism   . Insomnia   . Irritable bowel syndrome   . LLQ abdominal pain   . Melanoma ([place]) 2013  . Melanoma ([place])    x2 stg II melanoma  . Neuropathy, peripheral   . Peripheral neuropathy   . Pneumonia    hx of x 2  . PONV (postoperative nausea and vomiting)   . Pre-diabetes   . Retina disorder    left  . Stroke [facility])    ? TIA per Dr V per patient   . Thyroid disease   . Weight gain     Past  Surgical History:  Procedure Laterality Date  . CHOLECYSTECTOMY N/A 01/28/2015   Procedure: LAPAROSCOPIC CHOLECYSTECTOMY WITH INTRAOPERATIVE CHOLANGIOGRAM;  Surgeon: Autumn Messing III, MD;  Location: [place];  Service: General;  Laterality: N/A;  . CLOSED MANIPULATION SHOULDER     rt  . COLONOSCOPY WITH PROPOFOL N/A 11/20/2012   Procedure: COLONOSCOPY WITH PROPOFOL;  Surgeon: Garlan Fair, MD;  Location: [facility];  Service: Endoscopy;  Laterality: N/A;  . colonscopy     May 2014  . JOINT REPLACEMENT     RTK  . KNEE ARTHROSCOPY    . left knee miniscus tear repair    . MELANOMA EXCISION WITH SENTINEL LYMPH NODE BIOPSY  03/18/11   Back, nodes neg  . ROTATOR CUFF REPAIR     left  . SHOULDER SURGERY     right  . TOTAL KNEE ARTHROPLASTY     right  . TOTAL KNEE ARTHROPLASTY Left 05/16/2017   Procedure: LEFT TOTAL KNEE ARTHROPLASTY;  Surgeon: Paralee Cancel, MD;  Location: [facility];  Service: Orthopedics;  Laterality: Left;  70 mins  . TUMOR REMOVAL     ovary    There were no vitals filed for this visit.  Subjective Assessment - 06/20/17 1621    Subjective  Patient compliant with HEP. Back pain continues to interrupt sleep. Pt. knee pain this morning 5-6/10, now no pain. Took medication this morning for pain.     Pertinent History  76 yo s/p LTKA with h/o vestibular problems, RTKA several years ago, back pain shoulder surgery back in 1990s, peripheral neuropathy, melanoma, migraines.  Patient had surgery October 30th with no noted complications. Went to [facility] for 10 days. Went home with home health PT, released last Friday. Doctor wants more aggressive therapy two times a week so referred to PT. Pt. Reports icing several times a day.     Limitations  Lifting;Standing;Walking;House hold activities;Other (comment)    How long can you stand comfortably?  5 minutes    How long can you walk comfortably?  needs AD, 5 minutes    Patient Stated Goals  to return to previous level of function     Currently in Pain?  Yes    Pain Score  2     Pain Location  Knee    Pain Orientation  Left    Pain Descriptors / Indicators  Aching;Tightness    Pain Type  Surgical pain    Pain Onset  1 to 4 weeks ago      seated flexion AROM 106 110 AAROM Supine knee extension -9 degrees  L lrg  Nustep Lvl 2 seat 8 4 minutes warm up Stair stretch knee flexion and extension 3x 30 seconds Standing knee extension pressing ball into wall 10x , 10x without ball Seated resisted knee flexion with RTB 12x  Supine AAROM knee flexion with band 5x20 second holds  Gluteal sets 10x 5 second holds  Seated LAQ with 3 second holds 10x   Manual:  PROM to L knee in supine Grade II-III AP, PA mobs L knee, also STM to incision site.  Patella: inferior, superior, med, lat mobilizations 60 seconds each direction    Pt. response to medical necessity:  Patient will continue to benefit from skilled physical therapy to improve L knee ROM and strength for improved quality of life and return to prior level of function.                       PT Education - 06/20/17 1622    Education provided  Yes    Education Details  HEP , strength and mobility of L knee    Person(s) Educated  Patient    Methods  Explanation;Demonstration;Verbal cues    Comprehension  Verbalized understanding;Returned demonstration       PT Short Term Goals - 06/14/17 1129      PT SHORT TERM GOAL #1   Title  Patient will be independent in home exercise program to improve strength/mobility for better functional independence with ADLs.    Baseline  HEP modifications    Time  2    Period  Weeks    Status  New    Target Date  06/28/17      PT SHORT TERM GOAL #2   Title  Pt. will increase L knee flexion to 112 degrees, and extension to -10 to improve ambulatory body mechanics    Baseline  L knee flexion: 106, extension -15    Time  2  Period  Weeks    Status  New    Target Date  06/28/17      PT SHORT TERM GOAL #3    Title  Patient will ambulate with SPC in public to impove mobility with least assistive device.     Baseline  ambulate with walker in public    Time  2    Period  Weeks    Status  New    Target Date  06/28/17        PT Long Term Goals - 06/14/17 1133      PT LONG TERM GOAL #1   Title  Patient will improve L knee flexion to 120 degrees and extension to neutral to increase ambulatory body mechanics.     Baseline  11/28: flexion 106, extension -15    Time  4    Period  Weeks    Status  New    Target Date  07/12/17      PT LONG TERM GOAL #2   Title  Patient (> 27 years old) will complete five times sit to stand test in < 15 seconds indicating an increased LE strength and improved balance    Baseline  11/28 : 18 seconds with poor weight shift    Time  4    Period  Weeks    Target Date  07/12/17      PT LONG TERM GOAL #3   Title  Patient will increase 10 meter walk test to >1.63m/s as to improve gait speed for better community ambulation and to reduce fall risk.    Baseline  11/28: .59m/s    Time  4    Period  Weeks    Status  New    Target Date  07/12/17      PT LONG TERM GOAL #4   Title  Patient will increase lower extremity functional scale to >60/80 to demonstrate improved functional mobility and increased tolerance with ADLs.     Baseline  11/18: 39/80    Time  4    Period  Weeks    Status  New    Target Date  07/12/17      PT LONG TERM GOAL #5   Title  Patient will increase ABC scale score >80% to demonstrate better functional mobility and better confidence with ADLs.     Baseline  11/28: 36.9 %    Time  4    Period  Weeks    Status  New    Target Date  07/12/17            Plan - 06/20/17 1706    Clinical Impression Statement  Patient improving in L knee mobility and ROM. Demonstrated understanding of new HEP and performed exercises with occasional cueing for form. Pt. Tender to knee flexion in supine position due to back pain. Patient will continue to benefit  from skilled physical therapy to improve L knee ROM and strength for improved quality of life and return to prior level of function.     Rehab Potential  Good    Clinical Impairments Affecting Rehab Potential  (+) motivated, not first knee replacement; (-) lives alone, swelling, age, back pain    PT Treatment/Interventions  Electrical Stimulation;Patient/family education;Cryotherapy;Moist Heat;Neuromuscular re-education;Therapeutic exercise;Manual techniques;ADLs/Self Care Home Management;Ultrasound;DME Instruction;Gait training;Stair training;Functional mobility training;Therapeutic activities;Balance training;Compression bandaging;Taping;Energy conservation;Dry needling;Passive range of motion;Scar mobilization    PT Next Visit Plan  knee ROM and strength, HEP    PT Home Exercise Plan  continue  home health, modify next session    Consulted and Agree with Plan of Care  Patient       Patient will benefit from skilled therapeutic intervention in order to improve the following deficits and impairments:  Decreased strength, Decreased activity tolerance, Impaired perceived functional ability, Pain, Decreased endurance, Decreased range of motion, Difficulty walking, Abnormal gait, Decreased balance, Decreased knowledge of use of DME, Decreased mobility, Decreased scar mobility, Hypomobility, Increased edema, Impaired flexibility, Impaired sensation, Postural dysfunction, Improper body mechanics  Visit Diagnosis: Stiffness of left knee, not elsewhere classified  Other abnormalities of gait and mobility  Muscle weakness (generalized)     Problem List Patient Active Problem List   Diagnosis Date Noted  . S/P left TKA 05/16/2017  . S/P total knee replacement 05/16/2017  . Female stress incontinence 05/08/2017  . Pain in pelvis 05/08/2017  . Coronary artery calcification seen on CAT scan 04/09/2017  . Carotid stenosis, asymptomatic, bilateral 04/09/2017  . Mixed hyperlipidemia 04/09/2017  . MCI  (mild cognitive impairment) 12/17/2015  . Urinary frequency 08/09/2015  . Hypothyroidism 08/09/2015  . Syncope 08/08/2015  . Posterior vitreous detachment of right eye 07/23/2015  . Hereditary and idiopathic peripheral neuropathy 05/11/2015  . Acute pancreatitis 12/21/2014  . Gallstones 12/21/2014  . Abdominal pain 12/21/2014  . Acute bronchitis due to infection 07/04/2014  . Epiretinal membrane, left 04/12/2012  . Nuclear cataract 04/12/2012  . Left lower quadrant pain 08/16/2011  . Breast pain in female 07/18/2011  . Melanoma of back ([place]) 02/28/2011   Janna Arch, PT, DPT   Janna Arch 06/20/2017, 5:09 PM  [place] MAIN [facility] SERVICES [address] [place], [place], [address] Phone: [phone]   Fax:  [phone]  Name: ANNIAH GLICK MRN: [id number] Date of Birth: 04-08-1941

## 2017-06-22 ENCOUNTER — Ambulatory Visit: Payer: PPO

## 2017-06-22 DIAGNOSIS — M25562 Pain in left knee: Secondary | ICD-10-CM

## 2017-06-22 DIAGNOSIS — R2689 Other abnormalities of gait and mobility: Secondary | ICD-10-CM

## 2017-06-22 DIAGNOSIS — M6281 Muscle weakness (generalized): Secondary | ICD-10-CM

## 2017-06-22 DIAGNOSIS — M25662 Stiffness of left knee, not elsewhere classified: Secondary | ICD-10-CM | POA: Diagnosis not present

## 2017-06-22 DIAGNOSIS — G8929 Other chronic pain: Secondary | ICD-10-CM

## 2017-06-22 NOTE — Therapy (Addendum)
[place] MAIN [facility] SERVICES [address] [place], [place], [address] Phone: [phone]   Fax:  [phone]  Physical Therapy Treatment  Patient Details  Name: [name] MRN: [id number] Date of Birth: [birth year] Referring Provider: Mauri Pole , MD   Encounter Date: 06/22/2017  PT End of Session - 06/22/17 1109    Visit Number  3    Number of Visits  8    Date for PT Re-Evaluation  07/12/17    Authorization Type  3    Authorization Time Period  10    PT Start Time  1100    PT Stop Time  1145    PT Time Calculation (min)  45 min    Equipment Utilized During Treatment  Gait belt    Activity Tolerance  Patient tolerated treatment well;Patient limited by pain    Behavior During Therapy  [facility] for tasks assessed/performed       Past Medical History:  Diagnosis Date  . Acid reflux   . Anemia   . Anxiety    in past  . Arthritis    all over  . Atypical moles   . Bronchitis   . CAD (coronary artery disease)    patient denies on preop of 10/24   . Carotid artery calcification, bilateral 04/2016  . Cataract   . Complication of anesthesia    shoulder surgery- not all the way asleep  . Depression    in past  . Dizziness   . Family history of adverse reaction to anesthesia    sister- has problems with nausea and vomiting   . Fatigue   . Generalized headache    migraines  . GERD (gastroesophageal reflux disease)   . History of chemotherapy    topical cream for h/o skin CA  . History of kidney stones   . Hypothyroidism   . Insomnia   . Irritable bowel syndrome   . LLQ abdominal pain   . Melanoma ([place]) 2013  . Melanoma ([place])    x2 stg II melanoma  . Neuropathy, peripheral   . Peripheral neuropathy   . Pneumonia    hx of x 2  . PONV (postoperative nausea and vomiting)   . Pre-diabetes   . Retina disorder    left  . Stroke [facility])    ? TIA per Dr V per patient   . Thyroid disease   . Weight gain     Past  Surgical History:  Procedure Laterality Date  . CHOLECYSTECTOMY N/A 01/28/2015   Procedure: LAPAROSCOPIC CHOLECYSTECTOMY WITH INTRAOPERATIVE CHOLANGIOGRAM;  Surgeon: Autumn Messing III, MD;  Location: [place];  Service: General;  Laterality: N/A;  . CLOSED MANIPULATION SHOULDER     rt  . COLONOSCOPY WITH PROPOFOL N/A 11/20/2012   Procedure: COLONOSCOPY WITH PROPOFOL;  Surgeon: Garlan Fair, MD;  [facility]: [facility];  Service: Endoscopy;  Laterality: N/A;  . colonscopy     May 2014  . JOINT REPLACEMENT     RTK  . KNEE ARTHROSCOPY    . left knee miniscus tear repair    . MELANOMA EXCISION WITH SENTINEL LYMPH NODE BIOPSY  03/18/11   Back, nodes neg  . ROTATOR CUFF REPAIR     left  . SHOULDER SURGERY     right  . TOTAL KNEE ARTHROPLASTY     right  . TOTAL KNEE ARTHROPLASTY Left 05/16/2017   Procedure: LEFT TOTAL KNEE ARTHROPLASTY;  Surgeon: Paralee Cancel, MD;  [facility];  Service: Orthopedics;  Laterality: Left;  70 mins  . TUMOR REMOVAL     ovary    There were no vitals filed for this visit.  Subjective Assessment - 06/22/17 1107    Subjective  Patient having increased pain below knee that shoots like a "shock" down into feet, not in knee but below it. Pain began last night and conitnuing into today. Took medicine for back and hydrocodone so pain is improving from medications.     Pertinent History  76 yo s/p LTKA with h/o vestibular problems, RTKA several years ago, back pain shoulder surgery back in 1990s, peripheral neuropathy, melanoma, migraines.  Patient had surgery October 30th with no noted complications. Went to [facility] for 10 days. Went home with home health PT, released last Friday. Doctor wants more aggressive therapy two times a week so referred to PT. Pt. Reports icing several times a day.     Limitations  Lifting;Standing;Walking;House hold activities;Other (comment)    How long can you stand comfortably?  5 minutes    How long can you walk comfortably?  needs AD, 5  minutes    Patient Stated Goals  to return to previous level of function    Currently in Pain?  Yes    Pain Score  6     Pain Location  Knee    Pain Orientation  Left    Pain Descriptors / Indicators  Radiating;Shooting    Pain Onset  1 to 4 weeks ago       seated flexion AROM 110 Supine knee extension -8 degrees   L lrg Nustep Lvl 3 seat 10 4 minutes warm up Stair stretch knee flexion and extension 3x 30 seconds Hamstring curl machine: #4 both legs 2x 15 Hamstring curl machine #3 both legs down L leg eccentric raise up  Quad extension machine: #3 both legs 2x 15 Quad extension machine #2 down with both legs eccentric control back down with LLE 2x 15  Manual:  PROM to L knee in supine Grade II-III AP, PA mobs L knee, also STM to incision site.      Pt. response to medical necessity:  Patient will continue to benefit from skilled physical therapy to improve L knee ROM and strength for improved quality of life and return to prior level of function                       PT Education - 06/22/17 1108    Education provided  Yes    Education Details  strength and mobility of L knee    Person(s) Educated  Patient    Methods  Explanation;Demonstration;Verbal cues;Tactile cues    Comprehension  Returned demonstration;Verbalized understanding       PT Short Term Goals - 06/14/17 1129      PT SHORT TERM GOAL #1   Title  Patient will be independent in home exercise program to improve strength/mobility for better functional independence with ADLs.    Baseline  HEP modifications    Time  2    Period  Weeks    Status  New    Target Date  06/28/17      PT SHORT TERM GOAL #2   Title  Pt. will increase L knee flexion to 112 degrees, and extension to -10 to improve ambulatory body mechanics    Baseline  L knee flexion: 106, extension -15    Time  2    Period  Weeks  Status  New    Target Date  06/28/17      PT SHORT TERM GOAL #3   Title  Patient will  ambulate with SPC in public to impove mobility with least assistive device.     Baseline  ambulate with walker in public    Time  2    Period  Weeks    Status  New    Target Date  06/28/17        PT Long Term Goals - 06/14/17 1133      PT LONG TERM GOAL #1   Title  Patient will improve L knee flexion to 120 degrees and extension to neutral to increase ambulatory body mechanics.     Baseline  11/28: flexion 106, extension -15    Time  4    Period  Weeks    Status  New    Target Date  07/12/17      PT LONG TERM GOAL #2   Title  Patient (> 51 years old) will complete five times sit to stand test in < 15 seconds indicating an increased LE strength and improved balance    Baseline  11/28 : 18 seconds with poor weight shift    Time  4    Period  Weeks    Target Date  07/12/17      PT LONG TERM GOAL #3   Title  Patient will increase 10 meter walk test to >1.27m/s as to improve gait speed for better community ambulation and to reduce fall risk.    Baseline  11/28: .19m/s    Time  4    Period  Weeks    Status  New    Target Date  07/12/17      PT LONG TERM GOAL #4   Title  Patient will increase lower extremity functional scale to >60/80 to demonstrate improved functional mobility and increased tolerance with ADLs.     Baseline  11/18: 39/80    Time  4    Period  Weeks    Status  New    Target Date  07/12/17      PT LONG TERM GOAL #5   Title  Patient will increase ABC scale score >80% to demonstrate better functional mobility and better confidence with ADLs.     Baseline  11/28: 36.9 %    Time  4    Period  Weeks    Status  New    Target Date  07/12/17            Plan - 06/22/17 1154    Clinical Impression Statement  Patient progressing with functional strength. Weak musculature surrounding knee leads to decreased gait mechanics. Patient demonstrates good body mechanics with minimal cueing required for strengthening interventions.     Rehab Potential  Good    Clinical  Impairments Affecting Rehab Potential  (+) motivated, not first knee replacement; (-) lives alone, swelling, age, back pain    PT Treatment/Interventions  Electrical Stimulation;Patient/family education;Cryotherapy;Moist Heat;Neuromuscular re-education;Therapeutic exercise;Manual techniques;ADLs/Self Care Home Management;Ultrasound;DME Instruction;Gait training;Stair training;Functional mobility training;Therapeutic activities;Balance training;Compression bandaging;Taping;Energy conservation;Dry needling;Passive range of motion;Scar mobilization    PT Next Visit Plan  knee ROM and strength, HEP    PT Home Exercise Plan  continue home health, modify next session    Consulted and Agree with Plan of Care  Patient       Patient will benefit from skilled therapeutic intervention in order to improve the following deficits and impairments:  Decreased  strength, Decreased activity tolerance, Impaired perceived functional ability, Pain, Decreased endurance, Decreased range of motion, Difficulty walking, Abnormal gait, Decreased balance, Decreased knowledge of use of DME, Decreased mobility, Decreased scar mobility, Hypomobility, Increased edema, Impaired flexibility, Impaired sensation, Postural dysfunction, Improper body mechanics  Visit Diagnosis: Stiffness of left knee, not elsewhere classified  Other abnormalities of gait and mobility  Muscle weakness (generalized)  Chronic pain of left knee     Problem List Patient Active Problem List   Diagnosis Date Noted  . S/P left TKA 05/16/2017  . S/P total knee replacement 05/16/2017  . Female stress incontinence 05/08/2017  . Pain in pelvis 05/08/2017  . Coronary artery calcification seen on CAT scan 04/09/2017  . Carotid stenosis, asymptomatic, bilateral 04/09/2017  . Mixed hyperlipidemia 04/09/2017  . MCI (mild cognitive impairment) 12/17/2015  . Urinary frequency 08/09/2015  . Hypothyroidism 08/09/2015  . Syncope 08/08/2015  . Posterior  vitreous detachment of right eye 07/23/2015  . Hereditary and idiopathic peripheral neuropathy 05/11/2015  . Acute pancreatitis 12/21/2014  . Gallstones 12/21/2014  . Abdominal pain 12/21/2014  . Acute bronchitis due to infection 07/04/2014  . Epiretinal membrane, left 04/12/2012  . Nuclear cataract 04/12/2012  . Left lower quadrant pain 08/16/2011  . Breast pain in female 07/18/2011  . Melanoma of back ([place]) 02/28/2011  Janna Arch, PT, DPT    Janna Arch 06/22/2017, 11:54 AM  [place] MAIN [facility] SERVICES [address] [place], [place], [address] Phone: [phone]   Fax:  [phone]  Name: [name] MRN: [id number] Date of Birth: [birth year]

## 2017-06-29 ENCOUNTER — Ambulatory Visit: Payer: PPO

## 2017-06-29 DIAGNOSIS — R2689 Other abnormalities of gait and mobility: Secondary | ICD-10-CM

## 2017-06-29 DIAGNOSIS — M25662 Stiffness of left knee, not elsewhere classified: Secondary | ICD-10-CM | POA: Diagnosis not present

## 2017-06-29 DIAGNOSIS — Z96652 Presence of left artificial knee joint: Secondary | ICD-10-CM | POA: Diagnosis not present

## 2017-06-29 DIAGNOSIS — Z471 Aftercare following joint replacement surgery: Secondary | ICD-10-CM | POA: Diagnosis not present

## 2017-06-29 DIAGNOSIS — R5383 Other fatigue: Secondary | ICD-10-CM | POA: Diagnosis not present

## 2017-06-29 DIAGNOSIS — M6281 Muscle weakness (generalized): Secondary | ICD-10-CM

## 2017-06-29 NOTE — Therapy (Signed)
[place] MAIN [facility] SERVICES [address] [place], [place], [address] Phone: [phone]   Fax:  [phone]  Physical Therapy Treatment  Patient Details  Name: [name] MRN: [id number] Date of Birth: [birth year] Referring Provider: Mauri Pole , MD   Encounter Date: 06/29/2017  PT End of Session - 06/29/17 0936    Visit Number  4    Number of Visits  8    Date for PT Re-Evaluation  07/12/17    Authorization Type  4    Authorization Time Period  10    PT Start Time  0930    PT Stop Time  1015    PT Time Calculation (min)  45 min    Equipment Utilized During Treatment  Gait belt    Activity Tolerance  Patient tolerated treatment well;Patient limited by pain    Behavior During Therapy  [facility] for tasks assessed/performed       Past Medical History:  Diagnosis Date  . Acid reflux   . Anemia   . Anxiety    in past  . Arthritis    all over  . Atypical moles   . Bronchitis   . CAD (coronary artery disease)    patient denies on preop of 10/24   . Carotid artery calcification, bilateral 04/2016  . Cataract   . Complication of anesthesia    shoulder surgery- not all the way asleep  . Depression    in past  . Dizziness   . Family history of adverse reaction to anesthesia    sister- has problems with nausea and vomiting   . Fatigue   . Generalized headache    migraines  . GERD (gastroesophageal reflux disease)   . History of chemotherapy    topical cream for h/o skin CA  . History of kidney stones   . Hypothyroidism   . Insomnia   . Irritable bowel syndrome   . LLQ abdominal pain   . Melanoma ([place]) 2013  . Melanoma ([place])    x2 stg II melanoma  . Neuropathy, peripheral   . Peripheral neuropathy   . Pneumonia    hx of x 2  . PONV (postoperative nausea and vomiting)   . Pre-diabetes   . Retina disorder    left  . Stroke [facility])    ? TIA per Dr V per patient   . Thyroid disease   . Weight gain     Past  Surgical History:  Procedure Laterality Date  . CHOLECYSTECTOMY N/A 01/28/2015   Procedure: LAPAROSCOPIC CHOLECYSTECTOMY WITH INTRAOPERATIVE CHOLANGIOGRAM;  Surgeon: Autumn Messing III, MD;  Location: [place];  Service: General;  Laterality: N/A;  . CLOSED MANIPULATION SHOULDER     rt  . COLONOSCOPY WITH PROPOFOL N/A 11/20/2012   Procedure: COLONOSCOPY WITH PROPOFOL;  Surgeon: Garlan Fair, MD;  [facility];  Service: Endoscopy;  Laterality: N/A;  . colonscopy     May 2014  . JOINT REPLACEMENT     RTK  . KNEE ARTHROSCOPY    . left knee miniscus tear repair    . MELANOMA EXCISION WITH SENTINEL LYMPH NODE BIOPSY  03/18/11   Back, nodes neg  . ROTATOR CUFF REPAIR     left  . SHOULDER SURGERY     right  . TOTAL KNEE ARTHROPLASTY     right  . TOTAL KNEE ARTHROPLASTY Left 05/16/2017   Procedure: LEFT TOTAL KNEE ARTHROPLASTY;  Surgeon: Paralee Cancel, MD;  Location: [facility];  Service: Orthopedics;  Laterality: Left;  70 mins  . TUMOR REMOVAL     ovary    There were no vitals filed for this visit.  Subjective Assessment - 06/29/17 0934    Subjective  Patient meeting with doctor today. Concerned about continued pain in knee. Did not come to Tuesday session due to fear of slipping on ice.     Pertinent History  76 yo s/p LTKA with h/o vestibular problems, RTKA several years ago, back pain shoulder surgery back in 1990s, peripheral neuropathy, melanoma, migraines.  Patient had surgery October 30th with no noted complications. Went to [facility] for 10 days. Went home with home health PT, released last Friday. Doctor wants more aggressive therapy two times a week so referred to PT. Pt. Reports icing several times a day.     Limitations  Lifting;Standing;Walking;House hold activities;Other (comment)    How long can you stand comfortably?  5 minutes    How long can you walk comfortably?  needs AD, 5 minutes    Patient Stated Goals  to return to previous level of function    Currently in Pain?   Yes    Pain Score  3     Pain Location  Knee    Pain Orientation  Left    Pain Descriptors / Indicators  Shooting    Pain Type  Surgical pain    Pain Onset  1 to 4 weeks ago    Pain Frequency  Intermittent       seated flexion AROM 114 Supine knee extension -7 degrees    L LE  Nustep Lvl 3 seat 10 4 minutes warm up  Hip flexor stretch supine 2x 60, requires foot on ground to prevent spasming.  Stair stretch knee flexion and extension 3x 30 seconds Lunges onto Bosu ball 15x  Side lunges onto Bosu Ball 15x  6 " step step ups and downs with finger tip support 20x with left leg lead Seated LAQ 15x 6 " step eccentric step down .  Hip flexion standing 15x each leg    Manual:  PROM to L knee in supine Grade II-III AP, PA mobs L knee, also STM to incision site.  Patellar mobilizations Superior, Inferior, Med, Lat 6x 10 seconds       Pt. response to medical necessity:  Patient will continue to benefit from skilled physical therapy to improve L knee ROM and strength for improved quality of life and return to prior level of function                     PT Education - 06/29/17 0935    Education provided  Yes    Education Details  strength and mobility of L knee.     Person(s) Educated  Patient    Methods  Explanation;Demonstration;Verbal cues    Comprehension  Verbalized understanding;Returned demonstration       PT Short Term Goals - 06/14/17 1129      PT SHORT TERM GOAL #1   Title  Patient will be independent in home exercise program to improve strength/mobility for better functional independence with ADLs.    Baseline  HEP modifications    Time  2    Period  Weeks    Status  New    Target Date  06/28/17      PT SHORT TERM GOAL #2   Title  Pt. will increase L knee flexion to 112 degrees, and extension to -10 to  improve ambulatory body mechanics    Baseline  L knee flexion: 106, extension -15    Time  2    Period  Weeks    Status  New    Target  Date  06/28/17      PT SHORT TERM GOAL #3   Title  Patient will ambulate with SPC in public to impove mobility with least assistive device.     Baseline  ambulate with walker in public    Time  2    Period  Weeks    Status  New    Target Date  06/28/17        PT Long Term Goals - 06/14/17 1133      PT LONG TERM GOAL #1   Title  Patient will improve L knee flexion to 120 degrees and extension to neutral to increase ambulatory body mechanics.     Baseline  11/28: flexion 106, extension -15    Time  4    Period  Weeks    Status  New    Target Date  07/12/17      PT LONG TERM GOAL #2   Title  Patient (> 54 years old) will complete five times sit to stand test in < 15 seconds indicating an increased LE strength and improved balance    Baseline  11/28 : 18 seconds with poor weight shift    Time  4    Period  Weeks    Target Date  07/12/17      PT LONG TERM GOAL #3   Title  Patient will increase 10 meter walk test to >1.11m/s as to improve gait speed for better community ambulation and to reduce fall risk.    Baseline  11/28: .27m/s    Time  4    Period  Weeks    Status  New    Target Date  07/12/17      PT LONG TERM GOAL #4   Title  Patient will increase lower extremity functional scale to >60/80 to demonstrate improved functional mobility and increased tolerance with ADLs.     Baseline  11/18: 39/80    Time  4    Period  Weeks    Status  New    Target Date  07/12/17      PT LONG TERM GOAL #5   Title  Patient will increase ABC scale score >80% to demonstrate better functional mobility and better confidence with ADLs.     Baseline  11/28: 36.9 %    Time  4    Period  Weeks    Status  New    Target Date  07/12/17            Plan - 06/29/17 1008    Clinical Impression Statement  Patient progressing with functional ROM and strength of LLE. L knee AROM extension continues to be limited but improved with repetition and exercise Pt. Challenged by stability of LLE at  this time, requiring finger tip support for stability. Patient will continue to benefit from skilled physical therapy to improve L knee ROM and strength for improved quality of life and return to prior level of function    Rehab Potential  Good    Clinical Impairments Affecting Rehab Potential  (+) motivated, not first knee replacement; (-) lives alone, swelling, age, back pain    PT Treatment/Interventions  Electrical Stimulation;Patient/family education;Cryotherapy;Moist Heat;Neuromuscular re-education;Therapeutic exercise;Manual techniques;ADLs/Self Care Home Management;Ultrasound;DME Instruction;Gait training;Stair training;Functional mobility training;Therapeutic activities;Balance training;Compression bandaging;Taping;Energy  conservation;Dry needling;Passive range of motion;Scar mobilization    PT Next Visit Plan  knee ROM and strength, HEP    PT Home Exercise Plan  continue home health, modify next session    Consulted and Agree with Plan of Care  Patient       Patient will benefit from skilled therapeutic intervention in order to improve the following deficits and impairments:  Decreased strength, Decreased activity tolerance, Impaired perceived functional ability, Pain, Decreased endurance, Decreased range of motion, Difficulty walking, Abnormal gait, Decreased balance, Decreased knowledge of use of DME, Decreased mobility, Decreased scar mobility, Hypomobility, Increased edema, Impaired flexibility, Impaired sensation, Postural dysfunction, Improper body mechanics  Visit Diagnosis: Stiffness of left knee, not elsewhere classified  Other abnormalities of gait and mobility  Muscle weakness (generalized)     Problem List Patient Active Problem List   Diagnosis Date Noted  . S/P left TKA 05/16/2017  . S/P total knee replacement 05/16/2017  . Female stress incontinence 05/08/2017  . Pain in pelvis 05/08/2017  . Coronary artery calcification seen on CAT scan 04/09/2017  . Carotid  stenosis, asymptomatic, bilateral 04/09/2017  . Mixed hyperlipidemia 04/09/2017  . MCI (mild cognitive impairment) 12/17/2015  . Urinary frequency 08/09/2015  . Hypothyroidism 08/09/2015  . Syncope 08/08/2015  . Posterior vitreous detachment of right eye 07/23/2015  . Hereditary and idiopathic peripheral neuropathy 05/11/2015  . Acute pancreatitis 12/21/2014  . Gallstones 12/21/2014  . Abdominal pain 12/21/2014  . Acute bronchitis due to infection 07/04/2014  . Epiretinal membrane, left 04/12/2012  . Nuclear cataract 04/12/2012  . Left lower quadrant pain 08/16/2011  . Breast pain in female 07/18/2011  . Melanoma of back ([place]) 02/28/2011   Janna Arch, PT, DPT   Janna Arch 06/29/2017, 10:14 AM  [place] MAIN [facility] SERVICES [address] [place], [place], [address] Phone: [phone]   Fax:  [phone]  Name: [name] MRN: [id number] Date of Birth: 07-04-1941

## 2017-07-04 ENCOUNTER — Ambulatory Visit: Payer: PPO

## 2017-07-06 ENCOUNTER — Ambulatory Visit: Payer: PPO

## 2017-07-06 DIAGNOSIS — R2689 Other abnormalities of gait and mobility: Secondary | ICD-10-CM

## 2017-07-06 DIAGNOSIS — M25662 Stiffness of left knee, not elsewhere classified: Secondary | ICD-10-CM

## 2017-07-06 DIAGNOSIS — M6281 Muscle weakness (generalized): Secondary | ICD-10-CM

## 2017-07-06 NOTE — Therapy (Signed)
[place] MAIN [facility] SERVICES [address] [place], [place], [address] Phone: [phone]   Fax:  [phone]  Physical Therapy Treatment  Patient Details  Name: [name] MRN: [id number] Date of Birth: 13-Mar-1941 Referring Provider: Mauri Pole , MD   Encounter Date: 07/06/2017  PT End of Session - 07/06/17 1407    Visit Number  5    Number of Visits  8    Date for PT Re-Evaluation  07/12/17    Authorization Type  5    Authorization Time Period  10    PT Start Time  1400    PT Stop Time  1445    PT Time Calculation (min)  45 min    Equipment Utilized During Treatment  Gait belt    Activity Tolerance  Patient tolerated treatment well;Patient limited by pain    Behavior During Therapy  [facility] for tasks assessed/performed       Past Medical History:  Diagnosis Date  . Acid reflux   . Anemia   . Anxiety    in past  . Arthritis    all over  . Atypical moles   . Bronchitis   . CAD (coronary artery disease)    patient denies on preop of 10/24   . Carotid artery calcification, bilateral 04/2016  . Cataract   . Complication of anesthesia    shoulder surgery- not all the way asleep  . Depression    in past  . Dizziness   . Family history of adverse reaction to anesthesia    sister- has problems with nausea and vomiting   . Fatigue   . Generalized headache    migraines  . GERD (gastroesophageal reflux disease)   . History of chemotherapy    topical cream for h/o skin CA  . History of kidney stones   . Hypothyroidism   . Insomnia   . Irritable bowel syndrome   . LLQ abdominal pain   . Melanoma ([place]) 2013  . Melanoma ([place])    x2 stg II melanoma  . Neuropathy, peripheral   . Peripheral neuropathy   . Pneumonia    hx of x 2  . PONV (postoperative nausea and vomiting)   . Pre-diabetes   . Retina disorder    left  . Stroke [facility])    ? TIA per Dr V per patient   . Thyroid disease   . Weight gain     Past  Surgical History:  Procedure Laterality Date  . CHOLECYSTECTOMY N/A 01/28/2015   Procedure: LAPAROSCOPIC CHOLECYSTECTOMY WITH INTRAOPERATIVE CHOLANGIOGRAM;  Surgeon: Autumn Messing III, MD;  Location: [place];  Service: General;  Laterality: N/A;  . CLOSED MANIPULATION SHOULDER     rt  . COLONOSCOPY WITH PROPOFOL N/A 11/20/2012   Procedure: COLONOSCOPY WITH PROPOFOL;  Surgeon: Garlan Fair, MD;  [facility];  Service: Endoscopy;  Laterality: N/A;  . colonscopy     May 2014  . JOINT REPLACEMENT     RTK  . KNEE ARTHROSCOPY    . left knee miniscus tear repair    . MELANOMA EXCISION WITH SENTINEL LYMPH NODE BIOPSY  03/18/11   Back, nodes neg  . ROTATOR CUFF REPAIR     left  . SHOULDER SURGERY     right  . TOTAL KNEE ARTHROPLASTY     right  . TOTAL KNEE ARTHROPLASTY Left 05/16/2017   Procedure: LEFT TOTAL KNEE ARTHROPLASTY;  Surgeon: Paralee Cancel, MD;  [facility];  Service: Orthopedics;  Laterality: Left;  70 mins  . TUMOR REMOVAL     ovary    There were no vitals filed for this visit.  Subjective Assessment - 07/06/17 1404    Subjective  Patient reports doctor is concerned with limited ROM, states he wants her to have 120 degrees by next session or will do surgical intervention. Pt. reports vestibular problems have started back up, had multiple near falls, will be doing another vesitbular test in the future.      Pertinent History  76 yo s/p LTKA with h/o vestibular problems, RTKA several years ago, back pain shoulder surgery back in 1990s, peripheral neuropathy, melanoma, migraines.  Patient had surgery October 30th with no noted complications. Went to [facility] for 10 days. Went home with home health PT, released last Friday. Doctor wants more aggressive therapy two times a week so referred to PT. Pt. Reports icing several times a day.     Limitations  Lifting;Standing;Walking;House hold activities;Other (comment)    How long can you stand comfortably?  5 minutes    How  long can you walk comfortably?  needs AD, 5 minutes    Patient Stated Goals  to return to previous level of function    Currently in Pain?  Yes    Pain Score  5     Pain Location  Knee    Pain Orientation  Left    Pain Descriptors / Indicators  Aching;Shooting    Pain Type  Surgical pain    Pain Onset  1 to 4 weeks ago    Pain Frequency  Intermittent         PROM seated: 108  Nustep Lvl 3 4 minutes   Stair stretch 10x 10 second holds Ankle pumps 20x  Sit to stand   Manual:  Contract relax: push pull sitting (push into extension) 10x 8 second holds  Contract relax: push pull 90 90 supine (kick into PT's shoulder) 10x 8 second holds    First 30 seconds of supine has severe dizziness making patient feel like shes passing out.   Posterior, anterior mobilizations hooklying in supine: 8x 15 seconds  Patellar mobilizations: superior, inferior, med, lat 6x 10 seconds   Prone: knee flexion PROM 8x 10 seconds    Pt. response to medical necessity:  Patient will continue to benefit from skilled physical therapy to improve L knee ROM and strength for improved quality of life and return to prior level of function.                   PT Education - 07/06/17 1407    Education provided  Yes    Education Details  not using heat on joint, improving ROM    Person(s) Educated  Patient    Methods  Explanation;Demonstration;Verbal cues    Comprehension  Verbalized understanding;Returned demonstration       PT Short Term Goals - 06/14/17 1129      PT SHORT TERM GOAL #1   Title  Patient will be independent in home exercise program to improve strength/mobility for better functional independence with ADLs.    Baseline  HEP modifications    Time  2    Period  Weeks    Status  New    Target Date  06/28/17      PT SHORT TERM GOAL #2   Title  Pt. will increase L knee flexion to 112 degrees, and extension to -10 to improve ambulatory body mechanics  Baseline  L knee  flexion: 106, extension -15    Time  2    Period  Weeks    Status  New    Target Date  06/28/17      PT SHORT TERM GOAL #3   Title  Patient will ambulate with SPC in public to impove mobility with least assistive device.     Baseline  ambulate with walker in public    Time  2    Period  Weeks    Status  New    Target Date  06/28/17        PT Long Term Goals - 06/14/17 1133      PT LONG TERM GOAL #1   Title  Patient will improve L knee flexion to 120 degrees and extension to neutral to increase ambulatory body mechanics.     Baseline  11/28: flexion 106, extension -15    Time  4    Period  Weeks    Status  New    Target Date  07/12/17      PT LONG TERM GOAL #2   Title  Patient (> 72 years old) will complete five times sit to stand test in < 15 seconds indicating an increased LE strength and improved balance    Baseline  11/28 : 18 seconds with poor weight shift    Time  4    Period  Weeks    Target Date  07/12/17      PT LONG TERM GOAL #3   Title  Patient will increase 10 meter walk test to >1.53m/s as to improve gait speed for better community ambulation and to reduce fall risk.    Baseline  11/28: .19m/s    Time  4    Period  Weeks    Status  New    Target Date  07/12/17      PT LONG TERM GOAL #4   Title  Patient will increase lower extremity functional scale to >60/80 to demonstrate improved functional mobility and increased tolerance with ADLs.     Baseline  11/18: 39/80    Time  4    Period  Weeks    Status  New    Target Date  07/12/17      PT LONG TERM GOAL #5   Title  Patient will increase ABC scale score >80% to demonstrate better functional mobility and better confidence with ADLs.     Baseline  11/28: 36.9 %    Time  4    Period  Weeks    Status  New    Target Date  07/12/17            Plan - 07/06/17 1455    Clinical Impression Statement  Patient progressing with functional ROM. Will increase focus on flexion of L knee due to pt. Report of  surgeon worried about not having 120 degrees yet at this time. Patient limited in positional changes due to dizziness and potential orthostatic hypotension.  Patient will continue to benefit from skilled physical therapy to improve L knee ROM and strength for improved quality of life and return to prior level of function.     Rehab Potential  Good    Clinical Impairments Affecting Rehab Potential  (+) motivated, not first knee replacement; (-) lives alone, swelling, age, back pain    PT Treatment/Interventions  Electrical Stimulation;Patient/family education;Cryotherapy;Moist Heat;Neuromuscular re-education;Therapeutic exercise;Manual techniques;ADLs/Self Care Home Management;Ultrasound;DME Instruction;Gait training;Stair training;Functional mobility training;Therapeutic activities;Balance training;Compression bandaging;Taping;Energy conservation;Dry needling;Passive  range of motion;Scar mobilization    PT Next Visit Plan  knee ROM and strength, HEP    PT Home Exercise Plan  continue home health, modify next session    Consulted and Agree with Plan of Care  Patient       Patient will benefit from skilled therapeutic intervention in order to improve the following deficits and impairments:  Decreased strength, Decreased activity tolerance, Impaired perceived functional ability, Pain, Decreased endurance, Decreased range of motion, Difficulty walking, Abnormal gait, Decreased balance, Decreased knowledge of use of DME, Decreased mobility, Decreased scar mobility, Hypomobility, Increased edema, Impaired flexibility, Impaired sensation, Postural dysfunction, Improper body mechanics  Visit Diagnosis: Stiffness of left knee, not elsewhere classified  Other abnormalities of gait and mobility  Muscle weakness (generalized)     Problem List Patient Active Problem List   Diagnosis Date Noted  . S/P left TKA 05/16/2017  . S/P total knee replacement 05/16/2017  . Female stress incontinence 05/08/2017   . Pain in pelvis 05/08/2017  . Coronary artery calcification seen on CAT scan 04/09/2017  . Carotid stenosis, asymptomatic, bilateral 04/09/2017  . Mixed hyperlipidemia 04/09/2017  . MCI (mild cognitive impairment) 12/17/2015  . Urinary frequency 08/09/2015  . Hypothyroidism 08/09/2015  . Syncope 08/08/2015  . Posterior vitreous detachment of right eye 07/23/2015  . Hereditary and idiopathic peripheral neuropathy 05/11/2015  . Acute pancreatitis 12/21/2014  . Gallstones 12/21/2014  . Abdominal pain 12/21/2014  . Acute bronchitis due to infection 07/04/2014  . Epiretinal membrane, left 04/12/2012  . Nuclear cataract 04/12/2012  . Left lower quadrant pain 08/16/2011  . Breast pain in female 07/18/2011  . Melanoma of back ([place]) 02/28/2011  Janna Arch, PT, DPT    Janna Arch 07/06/2017, 2:56 PM  [place] MAIN [facility] SERVICES [address] [place], [place], [address] Phone: [phone]   Fax:  [phone]  Name: [name] MRN: [id number] Date of Birth: [birth year]

## 2017-07-12 ENCOUNTER — Ambulatory Visit: Payer: PPO

## 2017-07-12 DIAGNOSIS — M6281 Muscle weakness (generalized): Secondary | ICD-10-CM

## 2017-07-12 DIAGNOSIS — R2689 Other abnormalities of gait and mobility: Secondary | ICD-10-CM

## 2017-07-12 DIAGNOSIS — M25662 Stiffness of left knee, not elsewhere classified: Secondary | ICD-10-CM | POA: Diagnosis not present

## 2017-07-12 DIAGNOSIS — M25562 Pain in left knee: Secondary | ICD-10-CM

## 2017-07-12 DIAGNOSIS — G8929 Other chronic pain: Secondary | ICD-10-CM

## 2017-07-12 NOTE — Therapy (Signed)
[place] MAIN [facility] SERVICES [address] [place], [place], [address] Phone: [phone]   Fax:  [phone]  Physical Therapy Treatment  Patient Details  Name: [name] MRN: [id number] Date of Birth: [birth year] Referring Provider: Mauri Pole , MD   Encounter Date: 07/12/2017  PT End of Session - 07/12/17 1307    Visit Number  6    Number of Visits  16    Date for PT Re-Evaluation  08/09/17    Authorization Type  6 (next one 1/10)    Authorization Time Period  10    PT Start Time  1300    PT Stop Time  1345    PT Time Calculation (min)  45 min    Equipment Utilized During Treatment  Gait belt    Activity Tolerance  Patient tolerated treatment well;Patient limited by pain    Behavior During Therapy  [facility] for tasks assessed/performed       Past Medical History:  Diagnosis Date  . Acid reflux   . Anemia   . Anxiety    in past  . Arthritis    all over  . Atypical moles   . Bronchitis   . CAD (coronary artery disease)    patient denies on preop of 10/24   . Carotid artery calcification, bilateral 04/2016  . Cataract   . Complication of anesthesia    shoulder surgery- not all the way asleep  . Depression    in past  . Dizziness   . Family history of adverse reaction to anesthesia    sister- has problems with nausea and vomiting   . Fatigue   . Generalized headache    migraines  . GERD (gastroesophageal reflux disease)   . History of chemotherapy    topical cream for h/o skin CA  . History of kidney stones   . Hypothyroidism   . Insomnia   . Irritable bowel syndrome   . LLQ abdominal pain   . Melanoma ([place]) 2013  . Melanoma ([place])    x2 stg II melanoma  . Neuropathy, peripheral   . Peripheral neuropathy   . Pneumonia    hx of x 2  . PONV (postoperative nausea and vomiting)   . Pre-diabetes   . Retina disorder    left  . Stroke [facility])    ? TIA per Dr V per patient   . Thyroid disease   . Weight gain      Past Surgical History:  Procedure Laterality Date  . CHOLECYSTECTOMY N/A 01/28/2015   Procedure: LAPAROSCOPIC CHOLECYSTECTOMY WITH INTRAOPERATIVE CHOLANGIOGRAM;  Surgeon: Autumn Messing III, MD;  Location: [place];  Service: General;  Laterality: N/A;  . CLOSED MANIPULATION SHOULDER     rt  . COLONOSCOPY WITH PROPOFOL N/A 11/20/2012   Procedure: COLONOSCOPY WITH PROPOFOL;  Surgeon: Garlan Fair, MD;  [facility];  Service: Endoscopy;  Laterality: N/A;  . colonscopy     May 2014  . JOINT REPLACEMENT     RTK  . KNEE ARTHROSCOPY    . left knee miniscus tear repair    . MELANOMA EXCISION WITH SENTINEL LYMPH NODE BIOPSY  03/18/11   Back, nodes neg  . ROTATOR CUFF REPAIR     left  . SHOULDER SURGERY     right  . TOTAL KNEE ARTHROPLASTY     right  . TOTAL KNEE ARTHROPLASTY Left 05/16/2017   Procedure: LEFT TOTAL KNEE ARTHROPLASTY;  Surgeon: Paralee Cancel, MD;  Location: [facility];  Service: Orthopedics;  Laterality: Left;  70 mins  . TUMOR REMOVAL     ovary    There were no vitals filed for this visit.  Subjective Assessment - 07/12/17 1304    Subjective  Patient reports having a great christmas. Feeling stiff today. Is feeling more off balance due to dizziness today.  Is getting a vestibular test tomorrow.     Pertinent History  76 yo s/p LTKA with h/o vestibular problems, RTKA several years ago, back pain shoulder surgery back in 1990s, peripheral neuropathy, melanoma, migraines.  Patient had surgery October 30th with no noted complications. Went to [facility] for 10 days. Went home with home health PT, released last Friday. Doctor wants more aggressive therapy two times a week so referred to PT. Pt. Reports icing several times a day.     Limitations  Lifting;Standing;Walking;House hold activities;Other (comment)    How long can you stand comfortably?  5 minutes    How long can you walk comfortably?  needs AD, 5 minutes    Patient Stated Goals  to return to previous level of  function    Currently in Pain?  Yes    Pain Score  3     Pain Location  Knee    Pain Orientation  Left    Pain Descriptors / Indicators  Aching;Shooting    Pain Type  Surgical pain    Pain Onset  1 to 4 weeks ago    Pain Frequency  Intermittent          L knee flexion  Seated flexion: : 112 with slight hip hike   Supine extension: -6 extension  Supine flexion AROM : 114    5x STS: 9 seconds with hands, 11 seconds without hands 10MWT: 9 seconds with cane. =1.63ms with cane LEFS= 42/80 ABC=57.8%  Nustep Lvl 3 4 minutes warm up (no charge)  Manual: Supine knee flexion prom with prolonged holds 20 second holds x 5  Patellar mobilizations superior inferior med lat 6x10 seconds  Scar tissue massage                     PT Education - 07/12/17 1306    Education provided  Yes    Education Details  POC, improving ROM of L knee    Person(s) Educated  Patient    Methods  Explanation;Demonstration;Verbal cues    Comprehension  Verbalized understanding;Returned demonstration       PT Short Term Goals - 07/12/17 1312      PT SHORT TERM GOAL #1   Title  Patient will be independent in home exercise program to improve strength/mobility for better functional independence with ADLs.    Baseline  HEP compliant    Time  2    Period  Weeks    Status  Partially Met      PT SHORT TERM GOAL #2   Title  Pt. will increase L knee flexion to 112 degrees, and extension to -10 to improve ambulatory body mechanics    Baseline  114 degrees flexion -6 extension     Time  2    Period  Weeks    Status  Achieved      PT SHORT TERM GOAL #3   Title  Patient will ambulate with SPC in public to impove mobility with least assistive device.     Baseline  ambulate with SPC    Time  2    Period  Weeks  Status  Achieved      PT SHORT TERM GOAL #4   Title  Patient will ambulate 10 ft with no UE device demonstrating return to previous level of function.     Baseline  Require SPC     Time  2    Period  Weeks    Status  New    Target Date  07/26/17        PT Long Term Goals - 07/12/17 1313      PT LONG TERM GOAL #1   Title  Patient will improve L knee flexion to 120 degrees and extension to neutral to increase ambulatory body mechanics.     Baseline  11/28: flexion 106, extension -15: 12/26: supine AROM 114, extension -6     Time  4    Period  Weeks    Status  Partially Met      PT LONG TERM GOAL #2   Title  Patient (> 71 years old) will complete five times sit to stand test in < 15 seconds indicating an increased LE strength and improved balance    Baseline  11/28 : 18 seconds with poor weight shift 12/26: 11 seconds with good weight shift.     Time  4    Period  Weeks    Status  Achieved      PT LONG TERM GOAL #3   Title  Patient will increase 10 meter walk test to >1.3ms as to improve gait speed for better community ambulation and to reduce fall risk.    Baseline  11/28: .752m 12/26: 1.70m370mwith cane.     Time  4    Period  Weeks    Status  Achieved      PT LONG TERM GOAL #4   Title  Patient will increase lower extremity functional scale to >60/80 to demonstrate improved functional mobility and increased tolerance with ADLs.     Baseline  11/18: 39/80 12/26: 42/80     Time  4    Period  Weeks    Status  Partially Met      PT LONG TERM GOAL #5   Title  Patient will increase ABC scale score >80% to demonstrate better functional mobility and better confidence with ADLs.     Baseline  11/28: 36.9 % 12/26: 57.8%    Time  4    Period  Weeks    Status  Partially Met            Plan - 07/12/17 1700    Clinical Impression Statement  Patient reports balance confidence is not related to leg,  Rather to unsteadiness due to dizziness. Patient is improving with increased functional ROM with supine AROM 114 degrees and extension -6 degrees with overpressure. 5x STS without hands 11 seconds, 10 MWT= 1.1 m/s with cane. LEFS 42/80, ABC 57.8%, Patient will  continue to benefit from skilled physical therapy to improve L knee ROM and strength for improved quality of life and return to prior level of function.     Rehab Potential  Good    Clinical Impairments Affecting Rehab Potential  (+) motivated, not first knee replacement; (-) lives alone, swelling, age, back pain    PT Treatment/Interventions  Electrical Stimulation;Patient/family education;Cryotherapy;Moist Heat;Neuromuscular re-education;Therapeutic exercise;Manual techniques;ADLs/Self Care Home Management;Ultrasound;DME Instruction;Gait training;Stair training;Functional mobility training;Therapeutic activities;Balance training;Compression bandaging;Taping;Energy conservation;Dry needling;Passive range of motion;Scar mobilization    PT Next Visit Plan  knee ROM and strength, HEP    PT Home Exercise Plan  continue home health, modify next session    Consulted and Agree with Plan of Care  Patient       Patient will benefit from skilled therapeutic intervention in order to improve the following deficits and impairments:  Decreased strength, Decreased activity tolerance, Impaired perceived functional ability, Pain, Decreased endurance, Decreased range of motion, Difficulty walking, Abnormal gait, Decreased balance, Decreased knowledge of use of DME, Decreased mobility, Decreased scar mobility, Hypomobility, Increased edema, Impaired flexibility, Impaired sensation, Postural dysfunction, Improper body mechanics  Visit Diagnosis: Stiffness of left knee, not elsewhere classified  Other abnormalities of gait and mobility  Muscle weakness (generalized)  Chronic pain of left knee   G-Codes - 07-25-2017 1316    Functional Assessment Tool Used (Outpatient Only)  LEFS, pain scale, ABC, gait mechanics ROM, 10MWT, 5x STS, strength, clinical judgment    Functional Limitation  Mobility: Walking and moving around    Mobility: Walking and Moving Around Current Status [phone])  At least 20 percent but less than  40 percent impaired, limited or restricted    Mobility: Walking and Moving Around Goal Status [phone])  At least 1 percent but less than 20 percent impaired, limited or restricted       Problem List Patient Active Problem List   Diagnosis Date Noted  . S/P left TKA 05/16/2017  . S/P total knee replacement 05/16/2017  . Female stress incontinence 05/08/2017  . Pain in pelvis 05/08/2017  . Coronary artery calcification seen on CAT scan 04/09/2017  . Carotid stenosis, asymptomatic, bilateral 04/09/2017  . Mixed hyperlipidemia 04/09/2017  . MCI (mild cognitive impairment) 12/17/2015  . Urinary frequency 08/09/2015  . Hypothyroidism 08/09/2015  . Syncope 08/08/2015  . Posterior vitreous detachment of right eye 07/23/2015  . Hereditary and idiopathic peripheral neuropathy 05/11/2015  . Acute pancreatitis 12/21/2014  . Gallstones 12/21/2014  . Abdominal pain 12/21/2014  . Acute bronchitis due to infection 07/04/2014  . Epiretinal membrane, left 04/12/2012  . Nuclear cataract 04/12/2012  . Left lower quadrant pain 08/16/2011  . Breast pain in female 07/18/2011  . Melanoma of back ([place]) 02/28/2011   Janna Arch, PT, DPT   Janna Arch 07-25-17, [place] MAIN [facility] SERVICES [address] [place], [place], [address] Phone: [phone]   Fax:  [phone]  Name: [name] MRN: [id number] Date of Birth: [birth year]

## 2017-07-13 DIAGNOSIS — H8110 Benign paroxysmal vertigo, unspecified ear: Secondary | ICD-10-CM | POA: Diagnosis not present

## 2017-07-14 ENCOUNTER — Ambulatory Visit: Payer: PPO

## 2017-07-14 DIAGNOSIS — M6281 Muscle weakness (generalized): Secondary | ICD-10-CM

## 2017-07-14 DIAGNOSIS — M25662 Stiffness of left knee, not elsewhere classified: Secondary | ICD-10-CM | POA: Diagnosis not present

## 2017-07-14 DIAGNOSIS — R2689 Other abnormalities of gait and mobility: Secondary | ICD-10-CM

## 2017-07-14 NOTE — Therapy (Signed)
[place] MAIN [facility] SERVICES [address] [place], [place], [address] Phone: [phone]   Fax:  [phone]  Physical Therapy Treatment  Patient Details  Name: [name] MRN: [id number] Date of Birth: 11/21/1940 Referring Provider: Mauri Pole , MD   Encounter Date: 07/14/2017  PT End of Session - 07/14/17 1052    Visit Number  7    Number of Visits  16    Date for PT Re-Evaluation  08/09/17    Authorization Type  1    Authorization Time Period  10    PT Start Time  1045    PT Stop Time  1130    PT Time Calculation (min)  45 min    Equipment Utilized During Treatment  Gait belt    Activity Tolerance  Patient tolerated treatment well;Patient limited by pain    Behavior During Therapy  [facility] for tasks assessed/performed       Past Medical History:  Diagnosis Date  . Acid reflux   . Anemia   . Anxiety    in past  . Arthritis    all over  . Atypical moles   . Bronchitis   . CAD (coronary artery disease)    patient denies on preop of 10/24   . Carotid artery calcification, bilateral 04/2016  . Cataract   . Complication of anesthesia    shoulder surgery- not all the way asleep  . Depression    in past  . Dizziness   . Family history of adverse reaction to anesthesia    sister- has problems with nausea and vomiting   . Fatigue   . Generalized headache    migraines  . GERD (gastroesophageal reflux disease)   . History of chemotherapy    topical cream for h/o skin CA  . History of kidney stones   . Hypothyroidism   . Insomnia   . Irritable bowel syndrome   . LLQ abdominal pain   . Melanoma ([place]) 2013  . Melanoma ([place])    x2 stg II melanoma  . Neuropathy, peripheral   . Peripheral neuropathy   . Pneumonia    hx of x 2  . PONV (postoperative nausea and vomiting)   . Pre-diabetes   . Retina disorder    left  . Stroke [facility])    ? TIA per Dr V per patient   . Thyroid disease   . Weight gain     Past  Surgical History:  Procedure Laterality Date  . CHOLECYSTECTOMY N/A 01/28/2015   Procedure: LAPAROSCOPIC CHOLECYSTECTOMY WITH INTRAOPERATIVE CHOLANGIOGRAM;  Surgeon: Autumn Messing III, MD;  Location: [place];  Service: General;  Laterality: N/A;  . CLOSED MANIPULATION SHOULDER     rt  . COLONOSCOPY WITH PROPOFOL N/A 11/20/2012   Procedure: COLONOSCOPY WITH PROPOFOL;  Surgeon: Garlan Fair, MD;  [facility];  Service: Endoscopy;  Laterality: N/A;  . colonscopy     May 2014  . JOINT REPLACEMENT     RTK  . KNEE ARTHROSCOPY    . left knee miniscus tear repair    . MELANOMA EXCISION WITH SENTINEL LYMPH NODE BIOPSY  03/18/11   Back, nodes neg  . ROTATOR CUFF REPAIR     left  . SHOULDER SURGERY     right  . TOTAL KNEE ARTHROPLASTY     right  . TOTAL KNEE ARTHROPLASTY Left 05/16/2017   Procedure: LEFT TOTAL KNEE ARTHROPLASTY;  Surgeon: Paralee Cancel, MD;  Location: [facility];  Service: Orthopedics;  Laterality: Left;  70 mins  . TUMOR REMOVAL     ovary    There were no vitals filed for this visit.  Subjective Assessment - 07/14/17 1050    Subjective  Patient met with vestibular doctor between sessions but was unable to perform multiple tests due to not having some of the directions so had taken medications limiting testing exam. Feeling sore and achy throughout all body due to weather.     Pertinent History  76 yo s/p LTKA with h/o vestibular problems, RTKA several years ago, back pain shoulder surgery back in 1990s, peripheral neuropathy, melanoma, migraines.  Patient had surgery October 30th with no noted complications. Went to [facility] for 10 days. Went home with home health PT, released last Friday. Doctor wants more aggressive therapy two times a week so referred to PT. Pt. Reports icing several times a day.     Limitations  Lifting;Standing;Walking;House hold activities;Other (comment)    How long can you stand comfortably?  5 minutes    How long can you walk comfortably?  needs  AD, 5 minutes    Patient Stated Goals  to return to previous level of function    Currently in Pain?  Yes    Pain Score  4     Pain Location  Knee    Pain Orientation  Left    Pain Descriptors / Indicators  Aching    Pain Type  Surgical pain    Pain Onset  1 to 4 weeks ago    Pain Frequency  Intermittent      PROM knee flexion supine 120  Active knee flexion 117   Nustep Lvl 3 4 minutes     Stair stretch 4x 20 second holds (20 second holds flexion, 20 second holds extension)    Hamstring curls swiss ball supine 20x  Adduction ball squeeze with knee extension 2x 10 with ball between ankles   Resisted knee flexion GTB 15x LLE.   Resisted abduction LLE GTB 15x   Resisted pf LLE GTB 15x   Seated abduction with straight legs (legs straight ahead in full extension then abduct) 10x    Ankle pumps 20x      Manual:  Contract relax: push pull sitting (push into extension) 10x 8 second holds   Contract relax: push pull 90 90 supine (kick into PT's shoulder) 10x 8 second holds      First 30 seconds of supine has severe dizziness making patient feel like shes passing out.      Patellar mobilizations: superior, inferior, med, lat 6x 10 seconds   Prone: knee flexion PROM 8x 10 seconds    Pt. response to medical necessity:  Patient will continue to benefit from skilled physical therapy to improve L knee ROM and strength for improved quality of life and return to prior level of function.                         PT Education - 07/14/17 1052    Education provided  Yes    Education Details  L knee ROM and strength    Person(s) Educated  Patient    Methods  Explanation;Demonstration;Verbal cues    Comprehension  Verbalized understanding;Returned demonstration       PT Short Term Goals - 07/12/17 1312      PT SHORT TERM GOAL #1   Title  Patient will be independent in home exercise program to improve strength/mobility  for better functional independence  with ADLs.    Baseline  HEP compliant    Time  2    Period  Weeks    Status  Partially Met      PT SHORT TERM GOAL #2   Title  Pt. will increase L knee flexion to 112 degrees, and extension to -10 to improve ambulatory body mechanics    Baseline  114 degrees flexion -6 extension     Time  2    Period  Weeks    Status  Achieved      PT SHORT TERM GOAL #3   Title  Patient will ambulate with SPC in public to impove mobility with least assistive device.     Baseline  ambulate with SPC    Time  2    Period  Weeks    Status  Achieved      PT SHORT TERM GOAL #4   Title  Patient will ambulate 10 ft with no UE device demonstrating return to previous level of function.     Baseline  Require SPC    Time  2    Period  Weeks    Status  New    Target Date  07/26/17        PT Long Term Goals - 07/12/17 1313      PT LONG TERM GOAL #1   Title  Patient will improve L knee flexion to 120 degrees and extension to neutral to increase ambulatory body mechanics.     Baseline  11/28: flexion 106, extension -15: 12/26: supine AROM 114, extension -6     Time  4    Period  Weeks    Status  Partially Met      PT LONG TERM GOAL #2   Title  Patient (> 30 years old) will complete five times sit to stand test in < 15 seconds indicating an increased LE strength and improved balance    Baseline  11/28 : 18 seconds with poor weight shift 12/26: 11 seconds with good weight shift.     Time  4    Period  Weeks    Status  Achieved      PT LONG TERM GOAL #3   Title  Patient will increase 10 meter walk test to >1.36ms as to improve gait speed for better community ambulation and to reduce fall risk.    Baseline  11/28: .778m 12/26: 1.8m78mwith cane.     Time  4    Period  Weeks    Status  Achieved      PT LONG TERM GOAL #4   Title  Patient will increase lower extremity functional scale to >60/80 to demonstrate improved functional mobility and increased tolerance with ADLs.     Baseline  11/18: 39/80  12/26: 42/80     Time  4    Period  Weeks    Status  Partially Met      PT LONG TERM GOAL #5   Title  Patient will increase ABC scale score >80% to demonstrate better functional mobility and better confidence with ADLs.     Baseline  11/28: 36.9 % 12/26: 57.8%    Time  4    Period  Weeks    Status  Partially Met            Plan - 07/14/17 1139    Clinical Impression Statement  Patient presents with increased muscular fatigue and dizziness limiting standing interventions. Seated strengthening  interventions performed. Patient AROM and PROM improved reaching 120 degrees of PROM in supine today. Patient will continue to benefit from skilled physical therapy to improve L knee ROM and strength for improved quality of life and return to prior level of function.     Rehab Potential  Good    Clinical Impairments Affecting Rehab Potential  (+) motivated, not first knee replacement; (-) lives alone, swelling, age, back pain    PT Treatment/Interventions  Electrical Stimulation;Patient/family education;Cryotherapy;Moist Heat;Neuromuscular re-education;Therapeutic exercise;Manual techniques;ADLs/Self Care Home Management;Ultrasound;DME Instruction;Gait training;Stair training;Functional mobility training;Therapeutic activities;Balance training;Compression bandaging;Taping;Energy conservation;Dry needling;Passive range of motion;Scar mobilization    PT Next Visit Plan  knee ROM and strength, HEP    PT Home Exercise Plan  continue home health, modify next session    Consulted and Agree with Plan of Care  Patient       Patient will benefit from skilled therapeutic intervention in order to improve the following deficits and impairments:  Decreased strength, Decreased activity tolerance, Impaired perceived functional ability, Pain, Decreased endurance, Decreased range of motion, Difficulty walking, Abnormal gait, Decreased balance, Decreased knowledge of use of DME, Decreased mobility, Decreased scar  mobility, Hypomobility, Increased edema, Impaired flexibility, Impaired sensation, Postural dysfunction, Improper body mechanics  Visit Diagnosis: Stiffness of left knee, not elsewhere classified  Other abnormalities of gait and mobility  Muscle weakness (generalized)     Problem List Patient Active Problem List   Diagnosis Date Noted  . S/P left TKA 05/16/2017  . S/P total knee replacement 05/16/2017  . Female stress incontinence 05/08/2017  . Pain in pelvis 05/08/2017  . Coronary artery calcification seen on CAT scan 04/09/2017  . Carotid stenosis, asymptomatic, bilateral 04/09/2017  . Mixed hyperlipidemia 04/09/2017  . MCI (mild cognitive impairment) 12/17/2015  . Urinary frequency 08/09/2015  . Hypothyroidism 08/09/2015  . Syncope 08/08/2015  . Posterior vitreous detachment of right eye 07/23/2015  . Hereditary and idiopathic peripheral neuropathy 05/11/2015  . Acute pancreatitis 12/21/2014  . Gallstones 12/21/2014  . Abdominal pain 12/21/2014  . Acute bronchitis due to infection 07/04/2014  . Epiretinal membrane, left 04/12/2012  . Nuclear cataract 04/12/2012  . Left lower quadrant pain 08/16/2011  . Breast pain in female 07/18/2011  . Melanoma of back ([place]) 02/28/2011   Janna Arch, PT, DPT   Janna Arch 07/14/2017, 11:39 AM  [place] MAIN [facility] SERVICES [address] [place], [place], [address] Phone: [phone]   Fax:  [phone]  Name: YASMIN BRONAUGH MRN: [id number] Date of Birth: [birth year]

## 2017-07-19 DIAGNOSIS — R42 Dizziness and giddiness: Secondary | ICD-10-CM | POA: Diagnosis not present

## 2017-07-20 ENCOUNTER — Ambulatory Visit: Payer: PPO | Attending: Orthopedic Surgery

## 2017-07-20 DIAGNOSIS — G8929 Other chronic pain: Secondary | ICD-10-CM

## 2017-07-20 DIAGNOSIS — M25662 Stiffness of left knee, not elsewhere classified: Secondary | ICD-10-CM | POA: Insufficient documentation

## 2017-07-20 DIAGNOSIS — R2689 Other abnormalities of gait and mobility: Secondary | ICD-10-CM | POA: Diagnosis not present

## 2017-07-20 DIAGNOSIS — M6281 Muscle weakness (generalized): Secondary | ICD-10-CM

## 2017-07-20 DIAGNOSIS — M25562 Pain in left knee: Secondary | ICD-10-CM | POA: Insufficient documentation

## 2017-07-20 DIAGNOSIS — S76011A Strain of muscle, fascia and tendon of right hip, initial encounter: Secondary | ICD-10-CM | POA: Diagnosis not present

## 2017-07-20 NOTE — Therapy (Signed)
[place] MAIN [facility] SERVICES [address] [place], [place], [address] Phone: [phone]   Fax:  [phone]  Physical Therapy Treatment  Patient Details  Name: [name] MRN: [id number] Date of Birth: Apr 01, 1941 Referring Provider: Mauri Pole , MD   Encounter Date: 07/20/2017  PT End of Session - 07/20/17 1521    Visit Number  8    Number of Visits  16    Date for PT Re-Evaluation  08/09/17    Authorization Type  2    Authorization Time Period  10    PT Start Time  1445    PT Stop Time  1530    PT Time Calculation (min)  45 min    Equipment Utilized During Treatment  Gait belt    Activity Tolerance  Patient tolerated treatment well;Patient limited by pain    Behavior During Therapy  [facility] for tasks assessed/performed       Past Medical History:  Diagnosis Date  . Acid reflux   . Anemia   . Anxiety    in past  . Arthritis    all over  . Atypical moles   . Bronchitis   . CAD (coronary artery disease)    patient denies on preop of 10/24   . Carotid artery calcification, bilateral 04/2016  . Cataract   . Complication of anesthesia    shoulder surgery- not all the way asleep  . Depression    in past  . Dizziness   . Family history of adverse reaction to anesthesia    sister- has problems with nausea and vomiting   . Fatigue   . Generalized headache    migraines  . GERD (gastroesophageal reflux disease)   . History of chemotherapy    topical cream for h/o skin CA  . History of kidney stones   . Hypothyroidism   . Insomnia   . Irritable bowel syndrome   . LLQ abdominal pain   . Melanoma ([place]) 2013  . Melanoma ([place])    x2 stg II melanoma  . Neuropathy, peripheral   . Peripheral neuropathy   . Pneumonia    hx of x 2  . PONV (postoperative nausea and vomiting)   . Pre-diabetes   . Retina disorder    left  . Stroke [facility])    ? TIA per Dr V per patient   . Thyroid disease   . Weight gain     Past  Surgical History:  Procedure Laterality Date  . CHOLECYSTECTOMY N/A 01/28/2015   Procedure: LAPAROSCOPIC CHOLECYSTECTOMY WITH INTRAOPERATIVE CHOLANGIOGRAM;  Surgeon: Autumn Messing III, MD;  Location: [place];  Service: General;  Laterality: N/A;  . CLOSED MANIPULATION SHOULDER     rt  . COLONOSCOPY WITH PROPOFOL N/A 11/20/2012   Procedure: COLONOSCOPY WITH PROPOFOL;  Surgeon: Garlan Fair, MD;  [facility];  Service: Endoscopy;  Laterality: N/A;  . colonscopy     May 2014  . JOINT REPLACEMENT     RTK  . KNEE ARTHROSCOPY    . left knee miniscus tear repair    . MELANOMA EXCISION WITH SENTINEL LYMPH NODE BIOPSY  03/18/11   Back, nodes neg  . ROTATOR CUFF REPAIR     left  . SHOULDER SURGERY     right  . TOTAL KNEE ARTHROPLASTY     right  . TOTAL KNEE ARTHROPLASTY Left 05/16/2017   Procedure: LEFT TOTAL KNEE ARTHROPLASTY;  Surgeon: Paralee Cancel, MD;  Location: [facility];  Service: Orthopedics;  Laterality: Left;  70 mins  . TUMOR REMOVAL     ovary    There were no vitals filed for this visit.  Subjective Assessment - 07/20/17 1450    Subjective  Patient twisted in bathroom after last session pulling on R hip and abdomen causing excessive pain, no imaging completed. Laying flat painful. Also went to vestibular doctor and will go back again the 8th.     Pertinent History  77 yo s/p LTKA with h/o vestibular problems, RTKA several years ago, back pain shoulder surgery back in 1990s, peripheral neuropathy, melanoma, migraines.  Patient had surgery October 30th with no noted complications. Went to [facility] for 10 days. Went home with home health PT, released last Friday. Doctor wants more aggressive therapy two times a week so referred to PT. Pt. Reports icing several times a day.     Limitations  Lifting;Standing;Walking;House hold activities;Other (comment)    How long can you stand comfortably?  5 minutes    How long can you walk comfortably?  needs AD, 5 minutes    Patient Stated  Goals  to return to previous level of function    Currently in Pain?  Yes    Pain Score  5     Pain Location  Knee    Pain Orientation  Left    Pain Descriptors / Indicators  Burning    Pain Type  Surgical pain    Pain Onset  1 to 4 weeks ago    Pain Frequency  Intermittent    Multiple Pain Sites  Yes    Pain Score  4    Pain Location  Abdomen    Pain Orientation  Lower;Right    Pain Descriptors / Indicators  Squeezing;Tender    Pain Type  Acute pain    Pain Onset  In the past 7 days    Pain Frequency  Constant        Supine L knee supine PROM 121  L standing position 105   lunge stretches in // bars  2x 30 seconds   Seated hamstring stretch 60 seconds   Quantum leg press bilateral leg: #100 lb 2x 10   Quantum leg press LLE #45 2x10   STS 10 x with cues for putting knees in line with ankles to increase LE strength   Airex pad: mini marches 20x    Manual:  AP mobilization to improve knee extension 10x 10 second mobilizations    First 30 seconds of supine has severe dizziness making patient feel like shes passing out.    Supine hamstring stretch 60 seconds   Patellar mobilizations: superior, inferior, med, lat 6x 10 seconds   Prone: knee flexion PROM 8x 10 seconds     Pt. response to medical necessity:  Patient will continue to benefit from skilled physical therapy to improve L knee ROM and strength for improved quality of life and return to prior level of function                      PT Education - 07/20/17 1521    Education provided  Yes    Education Details  assessing R abdomen, L knee ROM and strength     Person(s) Educated  Patient    Methods  Explanation;Demonstration;Verbal cues    Comprehension  Verbalized understanding;Returned demonstration       PT Short Term Goals - 07/12/17 1312      PT SHORT TERM  GOAL #1   Title  Patient will be independent in home exercise program to improve strength/mobility for better functional  independence with ADLs.    Baseline  HEP compliant    Time  2    Period  Weeks    Status  Partially Met      PT SHORT TERM GOAL #2   Title  Pt. will increase L knee flexion to 112 degrees, and extension to -10 to improve ambulatory body mechanics    Baseline  114 degrees flexion -6 extension     Time  2    Period  Weeks    Status  Achieved      PT SHORT TERM GOAL #3   Title  Patient will ambulate with SPC in public to impove mobility with least assistive device.     Baseline  ambulate with SPC    Time  2    Period  Weeks    Status  Achieved      PT SHORT TERM GOAL #4   Title  Patient will ambulate 10 ft with no UE device demonstrating return to previous level of function.     Baseline  Require SPC    Time  2    Period  Weeks    Status  New    Target Date  07/26/17        PT Long Term Goals - 07/12/17 1313      PT LONG TERM GOAL #1   Title  Patient will improve L knee flexion to 120 degrees and extension to neutral to increase ambulatory body mechanics.     Baseline  11/28: flexion 106, extension -15: 12/26: supine AROM 114, extension -6     Time  4    Period  Weeks    Status  Partially Met      PT LONG TERM GOAL #2   Title  Patient (> 35 years old) will complete five times sit to stand test in < 15 seconds indicating an increased LE strength and improved balance    Baseline  11/28 : 18 seconds with poor weight shift 12/26: 11 seconds with good weight shift.     Time  4    Period  Weeks    Status  Achieved      PT LONG TERM GOAL #3   Title  Patient will increase 10 meter walk test to >1.54ms as to improve gait speed for better community ambulation and to reduce fall risk.    Baseline  11/28: .766m 12/26: 1.22m59mwith cane.     Time  4    Period  Weeks    Status  Achieved      PT LONG TERM GOAL #4   Title  Patient will increase lower extremity functional scale to >60/80 to demonstrate improved functional mobility and increased tolerance with ADLs.     Baseline   11/18: 39/80 12/26: 42/80     Time  4    Period  Weeks    Status  Partially Met      PT LONG TERM GOAL #5   Title  Patient will increase ABC scale score >80% to demonstrate better functional mobility and better confidence with ADLs.     Baseline  11/28: 36.9 % 12/26: 57.8%    Time  4    Period  Weeks    Status  Partially Met            Plan - 07/20/17 1629  Clinical Impression Statement  Assessed patients R lower abdomen/ hip to determine limitations to therapy. Patient L knee PROM 121 degrees in supine. Pt. Stability limited by vestibular and R abdomen pain. Patient will continue to benefit from skilled physical therapy to improve L knee ROM and strength for improved quality of life and return to prior level of function    Rehab Potential  Good    Clinical Impairments Affecting Rehab Potential  (+) motivated, not first knee replacement; (-) lives alone, swelling, age, back pain    PT Treatment/Interventions  Electrical Stimulation;Patient/family education;Cryotherapy;Moist Heat;Neuromuscular re-education;Therapeutic exercise;Manual techniques;ADLs/Self Care Home Management;Ultrasound;DME Instruction;Gait training;Stair training;Functional mobility training;Therapeutic activities;Balance training;Compression bandaging;Taping;Energy conservation;Dry needling;Passive range of motion;Scar mobilization    PT Next Visit Plan  knee ROM and strength, HEP    PT Home Exercise Plan  continue home health, modify next session    Consulted and Agree with Plan of Care  Patient       Patient will benefit from skilled therapeutic intervention in order to improve the following deficits and impairments:  Decreased strength, Decreased activity tolerance, Impaired perceived functional ability, Pain, Decreased endurance, Decreased range of motion, Difficulty walking, Abnormal gait, Decreased balance, Decreased knowledge of use of DME, Decreased mobility, Decreased scar mobility, Hypomobility, Increased  edema, Impaired flexibility, Impaired sensation, Postural dysfunction, Improper body mechanics  Visit Diagnosis: Stiffness of left knee, not elsewhere classified  Other abnormalities of gait and mobility  Muscle weakness (generalized)  Chronic pain of left knee     Problem List Patient Active Problem List   Diagnosis Date Noted  . S/P left TKA 05/16/2017  . S/P total knee replacement 05/16/2017  . Female stress incontinence 05/08/2017  . Pain in pelvis 05/08/2017  . Coronary artery calcification seen on CAT scan 04/09/2017  . Carotid stenosis, asymptomatic, bilateral 04/09/2017  . Mixed hyperlipidemia 04/09/2017  . MCI (mild cognitive impairment) 12/17/2015  . Urinary frequency 08/09/2015  . Hypothyroidism 08/09/2015  . Syncope 08/08/2015  . Posterior vitreous detachment of right eye 07/23/2015  . Hereditary and idiopathic peripheral neuropathy 05/11/2015  . Acute pancreatitis 12/21/2014  . Gallstones 12/21/2014  . Abdominal pain 12/21/2014  . Acute bronchitis due to infection 07/04/2014  . Epiretinal membrane, left 04/12/2012  . Nuclear cataract 04/12/2012  . Left lower quadrant pain 08/16/2011  . Breast pain in female 07/18/2011  . Melanoma of back ([place]) 02/28/2011    Janna Arch, PT, DPT    Janna Arch 07/20/2017, 4:30 PM  [place] MAIN [facility] SERVICES [address] [place], [place], [address] Phone: [phone]   Fax:  [phone]  Name: [name] MRN: [id number] Date of Birth: 11-13-1940

## 2017-07-25 DIAGNOSIS — H8111 Benign paroxysmal vertigo, right ear: Secondary | ICD-10-CM | POA: Diagnosis not present

## 2017-07-25 DIAGNOSIS — R42 Dizziness and giddiness: Secondary | ICD-10-CM | POA: Diagnosis not present

## 2017-07-26 ENCOUNTER — Ambulatory Visit
Admission: RE | Admit: 2017-07-26 | Discharge: 2017-07-26 | Disposition: A | Discharge status: Home / Self Care | Payer: PPO | Source: Ambulatory Visit | Attending: Nurse Practitioner | Admitting: Nurse Practitioner

## 2017-07-26 ENCOUNTER — Other Ambulatory Visit: Payer: Self-pay | Admitting: Nurse Practitioner

## 2017-07-26 DIAGNOSIS — R1031 Right lower quadrant pain: Secondary | ICD-10-CM

## 2017-07-26 DIAGNOSIS — K449 Diaphragmatic hernia without obstruction or gangrene: Secondary | ICD-10-CM | POA: Diagnosis not present

## 2017-07-26 MED ORDER — IOPAMIDOL (ISOVUE-300) INJECTION 61%
100.0000 mL | Freq: Once | INTRAVENOUS | Status: AC | PRN
  Administered 2017-07-26: 100 mL via INTRAVENOUS

## 2017-07-28 ENCOUNTER — Ambulatory Visit: Payer: PPO

## 2017-07-28 ENCOUNTER — Other Ambulatory Visit: Payer: Self-pay | Admitting: Nurse Practitioner

## 2017-07-28 DIAGNOSIS — R1031 Right lower quadrant pain: Secondary | ICD-10-CM

## 2017-07-28 DIAGNOSIS — R42 Dizziness and giddiness: Secondary | ICD-10-CM | POA: Diagnosis not present

## 2017-07-28 DIAGNOSIS — M6281 Muscle weakness (generalized): Secondary | ICD-10-CM

## 2017-07-28 DIAGNOSIS — R2689 Other abnormalities of gait and mobility: Secondary | ICD-10-CM

## 2017-07-28 DIAGNOSIS — M25662 Stiffness of left knee, not elsewhere classified: Secondary | ICD-10-CM | POA: Diagnosis not present

## 2017-07-28 DIAGNOSIS — M25562 Pain in left knee: Secondary | ICD-10-CM

## 2017-07-28 DIAGNOSIS — G8929 Other chronic pain: Secondary | ICD-10-CM

## 2017-07-28 DIAGNOSIS — H8111 Benign paroxysmal vertigo, right ear: Secondary | ICD-10-CM | POA: Diagnosis not present

## 2017-07-28 NOTE — Therapy (Signed)
[place] MAIN [facility] SERVICES [address] [place], [place], [address] Phone: [phone]   Fax:  [phone]  Physical Therapy Treatment  Patient Details  Name: [name] MRN: [id number] Date of Birth: March 30, 1941 Referring Provider: Mauri Pole , MD   Encounter Date: 07/28/2017  PT End of Session - 07/28/17 0806    Visit Number  9    Number of Visits  16    Date for PT Re-Evaluation  08/09/17    Authorization Type  3    Authorization Time Period  10    PT Start Time  0759    PT Stop Time  0847    PT Time Calculation (min)  48 min    Equipment Utilized During Treatment  Gait belt    Activity Tolerance  Patient tolerated treatment well;Patient limited by pain    Behavior During Therapy  [facility] for tasks assessed/performed       Past Medical History:  Diagnosis Date  . Acid reflux   . Anemia   . Anxiety    in past  . Arthritis    all over  . Atypical moles   . Bronchitis   . CAD (coronary artery disease)    patient denies on preop of 10/24   . Carotid artery calcification, bilateral 04/2016  . Cataract   . Complication of anesthesia    shoulder surgery- not all the way asleep  . Depression    in past  . Dizziness   . Family history of adverse reaction to anesthesia    sister- has problems with nausea and vomiting   . Fatigue   . Generalized headache    migraines  . GERD (gastroesophageal reflux disease)   . History of chemotherapy    topical cream for h/o skin CA  . History of kidney stones   . Hypothyroidism   . Insomnia   . Irritable bowel syndrome   . LLQ abdominal pain   . Melanoma ([place]) 2013  . Melanoma ([place])    x2 stg II melanoma  . Neuropathy, peripheral   . Peripheral neuropathy   . Pneumonia    hx of x 2  . PONV (postoperative nausea and vomiting)   . Pre-diabetes   . Retina disorder    left  . Stroke [facility])    ? TIA per Dr V per patient   . Thyroid disease   . Weight gain     Past  Surgical History:  Procedure Laterality Date  . CHOLECYSTECTOMY N/A 01/28/2015   Procedure: LAPAROSCOPIC CHOLECYSTECTOMY WITH INTRAOPERATIVE CHOLANGIOGRAM;  Surgeon: Autumn Messing III, MD;  Location: [place];  Service: General;  Laterality: N/A;  . CLOSED MANIPULATION SHOULDER     rt  . COLONOSCOPY WITH PROPOFOL N/A 11/20/2012   Procedure: COLONOSCOPY WITH PROPOFOL;  Surgeon: Garlan Fair, MD;  [facility];  Service: Endoscopy;  Laterality: N/A;  . colonscopy     May 2014  . JOINT REPLACEMENT     RTK  . KNEE ARTHROSCOPY    . left knee miniscus tear repair    . MELANOMA EXCISION WITH SENTINEL LYMPH NODE BIOPSY  03/18/11   Back, nodes neg  . ROTATOR CUFF REPAIR     left  . SHOULDER SURGERY     right  . TOTAL KNEE ARTHROPLASTY     right  . TOTAL KNEE ARTHROPLASTY Left 05/16/2017   Procedure: LEFT TOTAL KNEE ARTHROPLASTY;  Surgeon: Paralee Cancel, MD;  Location: [facility];  Service: Orthopedics;  Laterality: Left;  70 mins  . TUMOR REMOVAL     ovary    There were no vitals filed for this visit.  Subjective Assessment - 07/28/17 0803    Subjective  Patient having decreased hip pain from previous fall. Pt. went to vestibular doctor and now is feeling a lot better, is going again today and hopefully that will do finish her vestibular therapy.     Pertinent History  77 yo s/p LTKA with h/o vestibular problems, RTKA several years ago, back pain shoulder surgery back in 1990s, peripheral neuropathy, melanoma, migraines.  Patient had surgery October 30th with no noted complications. Went to [facility] for 10 days. Went home with home health PT, released last Friday. Doctor wants more aggressive therapy two times a week so referred to PT. Pt. Reports icing several times a day.     Limitations  Lifting;Standing;Walking;House hold activities;Other (comment)    How long can you stand comfortably?  5 minutes    How long can you walk comfortably?  needs AD, 5 minutes    Patient Stated Goals  to  return to previous level of function    Currently in Pain?  Yes    Pain Score  2     Pain Location  Knee    Pain Orientation  Left    Pain Descriptors / Indicators  Burning    Pain Type  Surgical pain    Pain Onset  1 to 4 weeks ago       Supine L knee supine PROM 121   Supine L knee extension -8  lunge stretches in // bars  4x 30 seconds    Seated hamstring stretch 60 seconds   Standing gastroc/soleus stretch on prostretch in // bars 5x 20 seconds   Manual:  AP mobilization to improve knee extension 10x 10 second mobilizations    Posterior, anterior mobilizations hooklying in supine: 8x 15 seconds   Supine hamstring stretch 60 seconds   Patellar mobilizations: superior, inferior, med, lat 6x 10 seconds  LockHome mechanism mobilizations 8x 10 seconds left and right.  LLE.   Prone gastroc/soleus rolling to increase ROM.    Supine: knee flexion PROM 8x 10 seconds  Supine : knee extension PROM 8x 10 seconds        Pt. response to medical necessity:  Patient will continue to benefit from skilled physical therapy to improve L knee ROM and strength for improved quality of life and return to prior level of function                        PT Education - 07/28/17 0805    Education provided  Yes    Education Details  L knee ROM and strength    Person(s) Educated  Patient    Methods  Explanation;Demonstration;Verbal cues    Comprehension  Verbalized understanding;Returned demonstration       PT Short Term Goals - 07/12/17 1312      PT SHORT TERM GOAL #1   Title  Patient will be independent in home exercise program to improve strength/mobility for better functional independence with ADLs.    Baseline  HEP compliant    Time  2    Period  Weeks    Status  Partially Met      PT SHORT TERM GOAL #2   Title  Pt. will increase L knee flexion to 112 degrees, and extension to -10 to improve ambulatory  body mechanics    Baseline  114 degrees flexion -6  extension     Time  2    Period  Weeks    Status  Achieved      PT SHORT TERM GOAL #3   Title  Patient will ambulate with SPC in public to impove mobility with least assistive device.     Baseline  ambulate with SPC    Time  2    Period  Weeks    Status  Achieved      PT SHORT TERM GOAL #4   Title  Patient will ambulate 10 ft with no UE device demonstrating return to previous level of function.     Baseline  Require SPC    Time  2    Period  Weeks    Status  New    Target Date  07/26/17        PT Long Term Goals - 07/12/17 1313      PT LONG TERM GOAL #1   Title  Patient will improve L knee flexion to 120 degrees and extension to neutral to increase ambulatory body mechanics.     Baseline  11/28: flexion 106, extension -15: 12/26: supine AROM 114, extension -6     Time  4    Period  Weeks    Status  Partially Met      PT LONG TERM GOAL #2   Title  Patient (> 56 years old) will complete five times sit to stand test in < 15 seconds indicating an increased LE strength and improved balance    Baseline  11/28 : 18 seconds with poor weight shift 12/26: 11 seconds with good weight shift.     Time  4    Period  Weeks    Status  Achieved      PT LONG TERM GOAL #3   Title  Patient will increase 10 meter walk test to >1.73ms as to improve gait speed for better community ambulation and to reduce fall risk.    Baseline  11/28: .735m 12/26: 1.69m78mwith cane.     Time  4    Period  Weeks    Status  Achieved      PT LONG TERM GOAL #4   Title  Patient will increase lower extremity functional scale to >60/80 to demonstrate improved functional mobility and increased tolerance with ADLs.     Baseline  11/18: 39/80 12/26: 42/80     Time  4    Period  Weeks    Status  Partially Met      PT LONG TERM GOAL #5   Title  Patient will increase ABC scale score >80% to demonstrate better functional mobility and better confidence with ADLs.     Baseline  11/28: 36.9 % 12/26: 57.8%    Time  4     Period  Weeks    Status  Partially Met            Plan - 07/28/17 [id number] Clinical Impression Statement  Patient presents with increased stiffness limiting L knee extension . Patient required additional manual stretching and mobilization due to increased stiffness. Tightened hamstring and gastroc/soleus complex was relieved with manual increasing ROM by end of session.  Patient will continue to benefit from skilled physical therapy to improve L knee ROM and strength for improved quality of life and return to prior level of function    Rehab Potential  Good  Clinical Impairments Affecting Rehab Potential  (+) motivated, not first knee replacement; (-) lives alone, swelling, age, back pain    PT Treatment/Interventions  Electrical Stimulation;Patient/family education;Cryotherapy;Moist Heat;Neuromuscular re-education;Therapeutic exercise;Manual techniques;ADLs/Self Care Home Management;Ultrasound;DME Instruction;Gait training;Stair training;Functional mobility training;Therapeutic activities;Balance training;Compression bandaging;Taping;Energy conservation;Dry needling;Passive range of motion;Scar mobilization    PT Next Visit Plan  knee ROM and strength, HEP    PT Home Exercise Plan  continue home health, modify next session    Consulted and Agree with Plan of Care  Patient       Patient will benefit from skilled therapeutic intervention in order to improve the following deficits and impairments:  Decreased strength, Decreased activity tolerance, Impaired perceived functional ability, Pain, Decreased endurance, Decreased range of motion, Difficulty walking, Abnormal gait, Decreased balance, Decreased knowledge of use of DME, Decreased mobility, Decreased scar mobility, Hypomobility, Increased edema, Impaired flexibility, Impaired sensation, Postural dysfunction, Improper body mechanics  Visit Diagnosis: Stiffness of left knee, not elsewhere classified  Other abnormalities of gait and  mobility  Muscle weakness (generalized)  Chronic pain of left knee     Problem List Patient Active Problem List   Diagnosis Date Noted  . S/P left TKA 05/16/2017  . S/P total knee replacement 05/16/2017  . Female stress incontinence 05/08/2017  . Pain in pelvis 05/08/2017  . Coronary artery calcification seen on CAT scan 04/09/2017  . Carotid stenosis, asymptomatic, bilateral 04/09/2017  . Mixed hyperlipidemia 04/09/2017  . MCI (mild cognitive impairment) 12/17/2015  . Urinary frequency 08/09/2015  . Hypothyroidism 08/09/2015  . Syncope 08/08/2015  . Posterior vitreous detachment of right eye 07/23/2015  . Hereditary and idiopathic peripheral neuropathy 05/11/2015  . Acute pancreatitis 12/21/2014  . Gallstones 12/21/2014  . Abdominal pain 12/21/2014  . Acute bronchitis due to infection 07/04/2014  . Epiretinal membrane, left 04/12/2012  . Nuclear cataract 04/12/2012  . Left lower quadrant pain 08/16/2011  . Breast pain in female 07/18/2011  . Melanoma of back ([place]) 02/28/2011   Janna Arch, PT, DPT   Janna Arch 07/28/2017, 8:54 AM  [place] MAIN [facility] SERVICES [address] [place], [place], [address] Phone: [phone]   Fax:  [phone]  Name: [name] MRN: [id number] Date of Birth: [birth year]

## 2017-07-31 ENCOUNTER — Ambulatory Visit: Payer: PPO

## 2017-07-31 DIAGNOSIS — M25662 Stiffness of left knee, not elsewhere classified: Secondary | ICD-10-CM

## 2017-07-31 DIAGNOSIS — M6281 Muscle weakness (generalized): Secondary | ICD-10-CM

## 2017-07-31 DIAGNOSIS — R2689 Other abnormalities of gait and mobility: Secondary | ICD-10-CM

## 2017-07-31 DIAGNOSIS — M25562 Pain in left knee: Secondary | ICD-10-CM

## 2017-07-31 DIAGNOSIS — G8929 Other chronic pain: Secondary | ICD-10-CM

## 2017-07-31 NOTE — Therapy (Signed)
[place] MAIN [facility] SERVICES [address] [place], [place], [address] Phone: [phone]   Fax:  [phone]  Physical Therapy Treatment  Patient Details  Name: [name] MRN: [id number] Date of Birth: 1940-09-22 Referring Provider: Mauri Pole , MD   Encounter Date: 07/31/2017  PT End of Session - 07/31/17 1521    Visit Number  10    Number of Visits  16    Date for PT Re-Evaluation  08/09/17    PT Start Time  6811    PT Stop Time  1601    PT Time Calculation (min)  46 min    Equipment Utilized During Treatment  Gait belt    Activity Tolerance  Patient tolerated treatment well;Patient limited by pain    Behavior During Therapy  [facility] for tasks assessed/performed       Past Medical History:  Diagnosis Date  . Acid reflux   . Anemia   . Anxiety    in past  . Arthritis    all over  . Atypical moles   . Bronchitis   . CAD (coronary artery disease)    patient denies on preop of 10/24   . Carotid artery calcification, bilateral 04/2016  . Cataract   . Complication of anesthesia    shoulder surgery- not all the way asleep  . Depression    in past  . Dizziness   . Family history of adverse reaction to anesthesia    sister- has problems with nausea and vomiting   . Fatigue   . Generalized headache    migraines  . GERD (gastroesophageal reflux disease)   . History of chemotherapy    topical cream for h/o skin CA  . History of kidney stones   . Hypothyroidism   . Insomnia   . Irritable bowel syndrome   . LLQ abdominal pain   . Melanoma ([place]) 2013  . Melanoma ([place])    x2 stg II melanoma  . Neuropathy, peripheral   . Peripheral neuropathy   . Pneumonia    hx of x 2  . PONV (postoperative nausea and vomiting)   . Pre-diabetes   . Retina disorder    left  . Stroke [facility])    ? TIA per Dr V per patient   . Thyroid disease   . Weight gain     Past Surgical History:  Procedure Laterality Date  . CHOLECYSTECTOMY  N/A 01/28/2015   Procedure: LAPAROSCOPIC CHOLECYSTECTOMY WITH INTRAOPERATIVE CHOLANGIOGRAM;  Surgeon: Autumn Messing III, MD;  Location: [place];  Service: General;  Laterality: N/A;  . CLOSED MANIPULATION SHOULDER     rt  . COLONOSCOPY WITH PROPOFOL N/A 11/20/2012   Procedure: COLONOSCOPY WITH PROPOFOL;  Surgeon: Garlan Fair, MD;  [facility];  Service: Endoscopy;  Laterality: N/A;  . colonscopy     May 2014  . JOINT REPLACEMENT     RTK  . KNEE ARTHROSCOPY    . left knee miniscus tear repair    . MELANOMA EXCISION WITH SENTINEL LYMPH NODE BIOPSY  03/18/11   Back, nodes neg  . ROTATOR CUFF REPAIR     left  . SHOULDER SURGERY     right  . TOTAL KNEE ARTHROPLASTY     right  . TOTAL KNEE ARTHROPLASTY Left 05/16/2017   Procedure: LEFT TOTAL KNEE ARTHROPLASTY;  Surgeon: Paralee Cancel, MD;  Location: [facility];  Service: Orthopedics;  Laterality: Left;  70 mins  . TUMOR REMOVAL  ovary    There were no vitals filed for this visit.  Subjective Assessment - 07/31/17 1519    Subjective  Patient having increased R abdomen pain again from fall. Went to vestibular again on friday and now independent due to progress. Will see ortho next week.     Pertinent History  77 yo s/p LTKA with h/o vestibular problems, RTKA several years ago, back pain shoulder surgery back in 1990s, peripheral neuropathy, melanoma, migraines.  Patient had surgery October 30th with no noted complications. Went to [facility] for 10 days. Went home with home health PT, released last Friday. Doctor wants more aggressive therapy two times a week so referred to PT. Pt. Reports icing several times a day.     Limitations  Lifting;Standing;Walking;House hold activities;Other (comment)    How long can you stand comfortably?  5 minutes    How long can you walk comfortably?  needs AD, 5 minutes    Patient Stated Goals  to return to previous level of function    Currently in Pain?  Yes    Pain Score  2     Pain Location  Knee     Pain Orientation  Left    Pain Descriptors / Indicators  Burning    Pain Type  Surgical pain    Pain Onset  1 to 4 weeks ago    Pain Frequency  Intermittent       Supine L knee supine PROM 121   Nu Step Lvl 4 4 minutes    lunge stretches in // bars  4x 30 seconds    Seated hamstring stretch 60 seconds    Quantum leg press bilateral leg: #100 lb 2x 10    Quantum leg press LLE #45 2x10    Heel raises 20x   6: step toe taps 20x cues for decreasing velocity  6" step side toe taps 15 x each leg       Manual:  AP mobilization to improve knee extension 10x 10 second mobilizations   Contract relax push against PT shoulder supine 10x   Supine hamstring stretch 60 seconds   Patellar mobilizations: superior, inferior, med, lat 6x 10 seconds   Prone: knee flexion PROM 8x 10 seconds     Pt. response to medical necessity:  Patient will continue to benefit from skilled physical therapy to improve L knee ROM and strength for improved quality of life and return to prior level of function                        PT Education - 07/31/17 1521    Education provided  Yes    Education Details  L knee ROM and strength     Person(s) Educated  Patient    Methods  Explanation;Demonstration;Verbal cues    Comprehension  Verbalized understanding;Returned demonstration       PT Short Term Goals - 07/12/17 1312      PT SHORT TERM GOAL #1   Title  Patient will be independent in home exercise program to improve strength/mobility for better functional independence with ADLs.    Baseline  HEP compliant    Time  2    Period  Weeks    Status  Partially Met      PT SHORT TERM GOAL #2   Title  Pt. will increase L knee flexion to 112 degrees, and extension to -10 to improve ambulatory body mechanics    Baseline  114 degrees flexion -  6 extension     Time  2    Period  Weeks    Status  Achieved      PT SHORT TERM GOAL #3   Title  Patient will ambulate with SPC in  public to impove mobility with least assistive device.     Baseline  ambulate with SPC    Time  2    Period  Weeks    Status  Achieved      PT SHORT TERM GOAL #4   Title  Patient will ambulate 10 ft with no UE device demonstrating return to previous level of function.     Baseline  Require SPC    Time  2    Period  Weeks    Status  New    Target Date  07/26/17        PT Long Term Goals - 07/12/17 1313      PT LONG TERM GOAL #1   Title  Patient will improve L knee flexion to 120 degrees and extension to neutral to increase ambulatory body mechanics.     Baseline  11/28: flexion 106, extension -15: 12/26: supine AROM 114, extension -6     Time  4    Period  Weeks    Status  Partially Met      PT LONG TERM GOAL #2   Title  Patient (> 73 years old) will complete five times sit to stand test in < 15 seconds indicating an increased LE strength and improved balance    Baseline  11/28 : 18 seconds with poor weight shift 12/26: 11 seconds with good weight shift.     Time  4    Period  Weeks    Status  Achieved      PT LONG TERM GOAL #3   Title  Patient will increase 10 meter walk test to >1.58ms as to improve gait speed for better community ambulation and to reduce fall risk.    Baseline  11/28: .710m 12/26: 1.75m63mwith cane.     Time  4    Period  Weeks    Status  Achieved      PT LONG TERM GOAL #4   Title  Patient will increase lower extremity functional scale to >60/80 to demonstrate improved functional mobility and increased tolerance with ADLs.     Baseline  11/18: 39/80 12/26: 42/80     Time  4    Period  Weeks    Status  Partially Met      PT LONG TERM GOAL #5   Title  Patient will increase ABC scale score >80% to demonstrate better functional mobility and better confidence with ADLs.     Baseline  11/28: 36.9 % 12/26: 57.8%    Time  4    Period  Weeks    Status  Partially Met            Plan - 07/31/17 1543    Clinical Impression Statement  Patient  continues to have functional ROM of >120 degrees flexion of LLE. Stability and strength of LLE performed with close chained activities. Pain of R abdomen limited strengthening interventions due to positioning limitations.  Patient will continue to benefit from skilled physical therapy to improve L knee ROM and strength for improved quality of life and return to prior level of function    Rehab Potential  Good    Clinical Impairments Affecting Rehab Potential  (+) motivated, not first knee replacement; (-)  lives alone, swelling, age, back pain    PT Treatment/Interventions  Electrical Stimulation;Patient/family education;Cryotherapy;Moist Heat;Neuromuscular re-education;Therapeutic exercise;Manual techniques;ADLs/Self Care Home Management;Ultrasound;DME Instruction;Gait training;Stair training;Functional mobility training;Therapeutic activities;Balance training;Compression bandaging;Taping;Energy conservation;Dry needling;Passive range of motion;Scar mobilization    PT Next Visit Plan  knee ROM and strength, HEP    PT Home Exercise Plan  continue home health, modify next session    Consulted and Agree with Plan of Care  Patient       Patient will benefit from skilled therapeutic intervention in order to improve the following deficits and impairments:  Decreased strength, Decreased activity tolerance, Impaired perceived functional ability, Pain, Decreased endurance, Decreased range of motion, Difficulty walking, Abnormal gait, Decreased balance, Decreased knowledge of use of DME, Decreased mobility, Decreased scar mobility, Hypomobility, Increased edema, Impaired flexibility, Impaired sensation, Postural dysfunction, Improper body mechanics  Visit Diagnosis: Stiffness of left knee, not elsewhere classified  Other abnormalities of gait and mobility  Muscle weakness (generalized)  Chronic pain of left knee     Problem List Patient Active Problem List   Diagnosis Date Noted  . S/P left TKA  05/16/2017  . S/P total knee replacement 05/16/2017  . Female stress incontinence 05/08/2017  . Pain in pelvis 05/08/2017  . Coronary artery calcification seen on CAT scan 04/09/2017  . Carotid stenosis, asymptomatic, bilateral 04/09/2017  . Mixed hyperlipidemia 04/09/2017  . MCI (mild cognitive impairment) 12/17/2015  . Urinary frequency 08/09/2015  . Hypothyroidism 08/09/2015  . Syncope 08/08/2015  . Posterior vitreous detachment of right eye 07/23/2015  . Hereditary and idiopathic peripheral neuropathy 05/11/2015  . Acute pancreatitis 12/21/2014  . Gallstones 12/21/2014  . Abdominal pain 12/21/2014  . Acute bronchitis due to infection 07/04/2014  . Epiretinal membrane, left 04/12/2012  . Nuclear cataract 04/12/2012  . Left lower quadrant pain 08/16/2011  . Breast pain in female 07/18/2011  . Melanoma of back ([place]) 02/28/2011   Janna Arch, PT, DPT   Janna Arch 07/31/2017, 4:02 PM  [place] MAIN [facility] SERVICES [address] [place], [place], [address] Phone: [phone]   Fax:  [phone]  Name: [name] MRN: [id number] Date of Birth: 03-11-1941

## 2017-08-03 ENCOUNTER — Ambulatory Visit: Payer: PPO

## 2017-08-03 DIAGNOSIS — M6281 Muscle weakness (generalized): Secondary | ICD-10-CM

## 2017-08-03 DIAGNOSIS — M25562 Pain in left knee: Secondary | ICD-10-CM

## 2017-08-03 DIAGNOSIS — M25662 Stiffness of left knee, not elsewhere classified: Secondary | ICD-10-CM | POA: Diagnosis not present

## 2017-08-03 DIAGNOSIS — R2689 Other abnormalities of gait and mobility: Secondary | ICD-10-CM

## 2017-08-03 DIAGNOSIS — G8929 Other chronic pain: Secondary | ICD-10-CM

## 2017-08-03 NOTE — Therapy (Signed)
[place] MAIN [facility] SERVICES [address] [place], [place], [address] Phone: [phone]   Fax:  [phone]  Physical Therapy Treatment  Patient Details  Name: [name] MRN: [id number] Date of Birth: [birth year] Referring Provider: Mauri Pole , MD   Encounter Date: 08/03/2017  PT End of Session - 08/03/17 1606    Visit Number  11    Number of Visits  16    Date for PT Re-Evaluation  08/09/17    PT Start Time  1600    PT Stop Time  1645    PT Time Calculation (min)  45 min    Equipment Utilized During Treatment  Gait belt    Activity Tolerance  Patient tolerated treatment well;Patient limited by pain    Behavior During Therapy  [facility] for tasks assessed/performed       Past Medical History:  Diagnosis Date  . Acid reflux   . Anemia   . Anxiety    in past  . Arthritis    all over  . Atypical moles   . Bronchitis   . CAD (coronary artery disease)    patient denies on preop of 10/24   . Carotid artery calcification, bilateral 04/2016  . Cataract   . Complication of anesthesia    shoulder surgery- not all the way asleep  . Depression    in past  . Dizziness   . Family history of adverse reaction to anesthesia    sister- has problems with nausea and vomiting   . Fatigue   . Generalized headache    migraines  . GERD (gastroesophageal reflux disease)   . History of chemotherapy    topical cream for h/o skin CA  . History of kidney stones   . Hypothyroidism   . Insomnia   . Irritable bowel syndrome   . LLQ abdominal pain   . Melanoma ([place]) 2013  . Melanoma ([place])    x2 stg II melanoma  . Neuropathy, peripheral   . Peripheral neuropathy   . Pneumonia    hx of x 2  . PONV (postoperative nausea and vomiting)   . Pre-diabetes   . Retina disorder    left  . Stroke [facility])    ? TIA per Dr V per patient   . Thyroid disease   . Weight gain     Past Surgical History:  Procedure Laterality Date  . CHOLECYSTECTOMY  N/A 01/28/2015   Procedure: LAPAROSCOPIC CHOLECYSTECTOMY WITH INTRAOPERATIVE CHOLANGIOGRAM;  Surgeon: Autumn Messing III, MD;  Location: [place];  Service: General;  Laterality: N/A;  . CLOSED MANIPULATION SHOULDER     rt  . COLONOSCOPY WITH PROPOFOL N/A 11/20/2012   Procedure: COLONOSCOPY WITH PROPOFOL;  Surgeon: Garlan Fair, MD;  [facility];  Service: Endoscopy;  Laterality: N/A;  . colonscopy     May 2014  . JOINT REPLACEMENT     RTK  . KNEE ARTHROSCOPY    . left knee miniscus tear repair    . MELANOMA EXCISION WITH SENTINEL LYMPH NODE BIOPSY  03/18/11   Back, nodes neg  . ROTATOR CUFF REPAIR     left  . SHOULDER SURGERY     right  . TOTAL KNEE ARTHROPLASTY     right  . TOTAL KNEE ARTHROPLASTY Left 05/16/2017   Procedure: LEFT TOTAL KNEE ARTHROPLASTY;  Surgeon: Paralee Cancel, MD;  [facility];  Service: Orthopedics;  Laterality: Left;  70 mins  . TUMOR REMOVAL  ovary    There were no vitals filed for this visit.  Subjective Assessment - 08/03/17 1606    Subjective  Patient     Pertinent History  77 yo s/p LTKA with h/o vestibular problems, RTKA several years ago, back pain shoulder surgery back in 1990s, peripheral neuropathy, melanoma, migraines.  Patient had surgery October 30th with no noted complications. Went to [facility] for 10 days. Went home with home health PT, released last Friday. Doctor wants more aggressive therapy two times a week so referred to PT. Pt. Reports icing several times a day.     Limitations  Lifting;Standing;Walking;House hold activities;Other (comment)    How long can you stand comfortably?  5 minutes    How long can you walk comfortably?  needs AD, 5 minutes    Patient Stated Goals  to return to previous level of function    Currently in Pain?  Yes    Pain Score  2     Pain Location  Knee    Pain Orientation  Left    Pain Descriptors / Indicators  Burning    Pain Type  Surgical pain    Pain Onset  1 to 4 weeks ago    Pain  Frequency  Intermittent          Supine L knee supine PROM 123   Nu Step Lvl 4 4 minutes    stair stretches in // bars  4x 30 seconds BLE   stair hamstring stretch 60 seconds   Supine bridge 2x10, with PT assistance to feet for stability    Quantum leg press bilateral leg: #120 lb 2x 10    Quantum leg press LLE #60 2x10   airex : marching with 5-6 second holds 4 minutes    Heel raises 20x       Attempted prone donkey kick: caused hip flexor spasm       Manual:   Prone: Hip flexor stretch 4x 20 seconds    Contract relax push against PT shoulder supine 10x    Supine hamstring stretch 60 seconds   Patellar mobilizations: superior, inferior, med, lat 6x 10 seconds   Prone: knee flexion PROM 3x 10 seconds     Pt. response to medical necessity:  Patient will continue to benefit from skilled physical therapy to improve L knee ROM and strength for improved quality of life and return to prior level of function                      PT Education - 08/03/17 1606    Education provided  Yes    Education Details  L knee ROM and strength    Person(s) Educated  Patient    Methods  Explanation;Demonstration;Verbal cues    Comprehension  Verbalized understanding;Returned demonstration       PT Short Term Goals - 07/12/17 1312      PT SHORT TERM GOAL #1   Title  Patient will be independent in home exercise program to improve strength/mobility for better functional independence with ADLs.    Baseline  HEP compliant    Time  2    Period  Weeks    Status  Partially Met      PT SHORT TERM GOAL #2   Title  Pt. will increase L knee flexion to 112 degrees, and extension to -10 to improve ambulatory body mechanics    Baseline  114 degrees flexion -6 extension     Time  2  Period  Weeks    Status  Achieved      PT SHORT TERM GOAL #3   Title  Patient will ambulate with SPC in public to impove mobility with least assistive device.     Baseline  ambulate  with SPC    Time  2    Period  Weeks    Status  Achieved      PT SHORT TERM GOAL #4   Title  Patient will ambulate 10 ft with no UE device demonstrating return to previous level of function.     Baseline  Require SPC    Time  2    Period  Weeks    Status  New    Target Date  07/26/17        PT Long Term Goals - 07/12/17 1313      PT LONG TERM GOAL #1   Title  Patient will improve L knee flexion to 120 degrees and extension to neutral to increase ambulatory body mechanics.     Baseline  11/28: flexion 106, extension -15: 12/26: supine AROM 114, extension -6     Time  4    Period  Weeks    Status  Partially Met      PT LONG TERM GOAL #2   Title  Patient (> 9 years old) will complete five times sit to stand test in < 15 seconds indicating an increased LE strength and improved balance    Baseline  11/28 : 18 seconds with poor weight shift 12/26: 11 seconds with good weight shift.     Time  4    Period  Weeks    Status  Achieved      PT LONG TERM GOAL #3   Title  Patient will increase 10 meter walk test to >1.76ms as to improve gait speed for better community ambulation and to reduce fall risk.    Baseline  11/28: .745m 12/26: 1.36m42mwith cane.     Time  4    Period  Weeks    Status  Achieved      PT LONG TERM GOAL #4   Title  Patient will increase lower extremity functional scale to >60/80 to demonstrate improved functional mobility and increased tolerance with ADLs.     Baseline  11/18: 39/80 12/26: 42/80     Time  4    Period  Weeks    Status  Partially Met      PT LONG TERM GOAL #5   Title  Patient will increase ABC scale score >80% to demonstrate better functional mobility and better confidence with ADLs.     Baseline  11/28: 36.9 % 12/26: 57.8%    Time  4    Period  Weeks    Status  Partially Met            Plan - 08/03/17 1636    Clinical Impression Statement  Patient is a progressing with strength and ROM of LLE.  LLE PROM knee flexion >120 degrees.  Leg press progressing with strength indicating improved strength. Patient kept spasming when performing exercises, spasms decreased after drinking water. Patient will continue to benefit from skilled physical therapy to improve L knee ROM and strength for improved quality of life and return to prior level of function    Rehab Potential  Good    Clinical Impairments Affecting Rehab Potential  (+) motivated, not first knee replacement; (-) lives alone, swelling, age, back pain    PT  Frequency  2x / week    PT Duration  4 weeks    PT Treatment/Interventions  Electrical Stimulation;Patient/family education;Cryotherapy;Moist Heat;Neuromuscular re-education;Therapeutic exercise;Manual techniques;ADLs/Self Care Home Management;Ultrasound;DME Instruction;Gait training;Stair training;Functional mobility training;Therapeutic activities;Balance training;Compression bandaging;Taping;Energy conservation;Dry needling;Passive range of motion;Scar mobilization    PT Next Visit Plan  knee ROM and strength, HEP    PT Home Exercise Plan  continue home health, modify next session    Consulted and Agree with Plan of Care  Patient       Patient will benefit from skilled therapeutic intervention in order to improve the following deficits and impairments:  Decreased strength, Decreased activity tolerance, Impaired perceived functional ability, Pain, Decreased endurance, Decreased range of motion, Difficulty walking, Abnormal gait, Decreased balance, Decreased knowledge of use of DME, Decreased mobility, Decreased scar mobility, Hypomobility, Increased edema, Impaired flexibility, Impaired sensation, Postural dysfunction, Improper body mechanics  Visit Diagnosis: Stiffness of left knee, not elsewhere classified  Other abnormalities of gait and mobility  Muscle weakness (generalized)  Chronic pain of left knee     Problem List Patient Active Problem List   Diagnosis Date Noted  . S/P left TKA 05/16/2017  . S/P  total knee replacement 05/16/2017  . Female stress incontinence 05/08/2017  . Pain in pelvis 05/08/2017  . Coronary artery calcification seen on CAT scan 04/09/2017  . Carotid stenosis, asymptomatic, bilateral 04/09/2017  . Mixed hyperlipidemia 04/09/2017  . MCI (mild cognitive impairment) 12/17/2015  . Urinary frequency 08/09/2015  . Hypothyroidism 08/09/2015  . Syncope 08/08/2015  . Posterior vitreous detachment of right eye 07/23/2015  . Hereditary and idiopathic peripheral neuropathy 05/11/2015  . Acute pancreatitis 12/21/2014  . Gallstones 12/21/2014  . Abdominal pain 12/21/2014  . Acute bronchitis due to infection 07/04/2014  . Epiretinal membrane, left 04/12/2012  . Nuclear cataract 04/12/2012  . Left lower quadrant pain 08/16/2011  . Breast pain in female 07/18/2011  . Melanoma of back ([place]) 02/28/2011   Janna Arch, PT, DPT   Janna Arch 08/03/2017, 5:51 PM  [place] MAIN [facility] SERVICES [address] [place], [place], [address] Phone: [phone]   Fax:  [phone]  Name: [name] MRN: [id number] Date of Birth: 1940-10-17

## 2017-08-07 ENCOUNTER — Ambulatory Visit: Payer: PPO

## 2017-08-07 DIAGNOSIS — R2689 Other abnormalities of gait and mobility: Secondary | ICD-10-CM

## 2017-08-07 DIAGNOSIS — M25662 Stiffness of left knee, not elsewhere classified: Secondary | ICD-10-CM | POA: Diagnosis not present

## 2017-08-07 DIAGNOSIS — M25562 Pain in left knee: Secondary | ICD-10-CM

## 2017-08-07 DIAGNOSIS — M6281 Muscle weakness (generalized): Secondary | ICD-10-CM

## 2017-08-07 DIAGNOSIS — G8929 Other chronic pain: Secondary | ICD-10-CM

## 2017-08-07 NOTE — Therapy (Signed)
[place] MAIN [facility] SERVICES [address] [place], [place], [address] Phone: [phone]   Fax:  [phone]  Physical Therapy Treatment  Patient Details  Name: [name] MRN: [id number] Date of Birth: 18-Jul-1941 Referring Provider: Mauri Pole , MD   Encounter Date: 08/07/2017  PT End of Session - 08/07/17 1526    Visit Number  12    Number of Visits  16    Date for PT Re-Evaluation  09/04/17    PT Start Time  1025    PT Stop Time  1600    PT Time Calculation (min)  45 min    Equipment Utilized During Treatment  Gait belt    Activity Tolerance  Patient tolerated treatment well;Patient limited by pain    Behavior During Therapy  [facility] for tasks assessed/performed       Past Medical History:  Diagnosis Date  . Acid reflux   . Anemia   . Anxiety    in past  . Arthritis    all over  . Atypical moles   . Bronchitis   . CAD (coronary artery disease)    patient denies on preop of 10/24   . Carotid artery calcification, bilateral 04/2016  . Cataract   . Complication of anesthesia    shoulder surgery- not all the way asleep  . Depression    in past  . Dizziness   . Family history of adverse reaction to anesthesia    sister- has problems with nausea and vomiting   . Fatigue   . Generalized headache    migraines  . GERD (gastroesophageal reflux disease)   . History of chemotherapy    topical cream for h/o skin CA  . History of kidney stones   . Hypothyroidism   . Insomnia   . Irritable bowel syndrome   . LLQ abdominal pain   . Melanoma ([place]) 2013  . Melanoma ([place])    x2 stg II melanoma  . Neuropathy, peripheral   . Peripheral neuropathy   . Pneumonia    hx of x 2  . PONV (postoperative nausea and vomiting)   . Pre-diabetes   . Retina disorder    left  . Stroke [facility])    ? TIA per Dr V per patient   . Thyroid disease   . Weight gain     Past Surgical History:  Procedure Laterality Date  . CHOLECYSTECTOMY  N/A 01/28/2015   Procedure: LAPAROSCOPIC CHOLECYSTECTOMY WITH INTRAOPERATIVE CHOLANGIOGRAM;  Surgeon: Autumn Messing III, MD;  Location: [place];  Service: General;  Laterality: N/A;  . CLOSED MANIPULATION SHOULDER     rt  . COLONOSCOPY WITH PROPOFOL N/A 11/20/2012   Procedure: COLONOSCOPY WITH PROPOFOL;  Surgeon: Garlan Fair, MD;  [facility];  Service: Endoscopy;  Laterality: N/A;  . colonscopy     May 2014  . JOINT REPLACEMENT     RTK  . KNEE ARTHROSCOPY    . left knee miniscus tear repair    . MELANOMA EXCISION WITH SENTINEL LYMPH NODE BIOPSY  03/18/11   Back, nodes neg  . ROTATOR CUFF REPAIR     left  . SHOULDER SURGERY     right  . TOTAL KNEE ARTHROPLASTY     right  . TOTAL KNEE ARTHROPLASTY Left 05/16/2017   Procedure: LEFT TOTAL KNEE ARTHROPLASTY;  Surgeon: Paralee Cancel, MD;  Location: [facility];  Service: Orthopedics;  Laterality: Left;  70 mins  . TUMOR REMOVAL  ovary    There were no vitals filed for this visit.  Subjective Assessment - 08/07/17 1523    Subjective  Patient compliant with HEP, was doing some walking between sessions. Pain 2/10 with rise to 4/10 after performing exercises in medial aspect of knee.     Pertinent History  77 yo s/p LTKA with h/o vestibular problems, RTKA several years ago, back pain shoulder surgery back in 1990s, peripheral neuropathy, melanoma, migraines.  Patient had surgery October 30th with no noted complications. Went to [facility] for 10 days. Went home with home health PT, released last Friday. Doctor wants more aggressive therapy two times a week so referred to PT. Pt. Reports icing several times a day.     Limitations  Lifting;Standing;Walking;House hold activities;Other (comment)    How long can you stand comfortably?  5 minutes    How long can you walk comfortably?  needs AD, 5 minutes    Patient Stated Goals  to return to previous level of function    Currently in Pain?  Yes    Pain Score  2     Pain Location  Knee     Pain Orientation  Left    Pain Descriptors / Indicators  Burning    Pain Type  Surgical pain    Pain Onset  1 to 4 weeks ago    Pain Frequency  Intermittent       VAS: 4/10 medial knee   Walk 10 ft with no UE device L knee flexion LEFS= 50/80 ABC= 63.75%  6 min walk test: 950 ft with cane  Seated AROM: Flexion 113 Seated PROM: 120  Supine AROM: Flexion: 115 Supine PROM: 123  Treat:  sidelying IT band stretch 2x 30 seconds   Manual:   AP, PA, screwhome mechanism mobilizations 10x 5 second moments.   Contract relax push against PT shoulder supine 10x  Contract relax push against PT hand seated 5x   Supine hamstring stretch 60 seconds  Patellar mobilizations: superior, inferior, med, lat 6x 10 seconds  Ankle PROM flexion and extension 6x 15 second holds.   Pt. response to medical necessity: Patient will continue to benefit from skilled physical therapy to improve L knee ROM and strength for improved quality of life and return to prior level of function                    PT Education - 08/07/17 1525    Education provided  Yes    Education Details  L knee ROM and strength    Person(s) Educated  Patient    Methods  Explanation;Demonstration;Verbal cues    Comprehension  Verbalized understanding;Returned demonstration       PT Short Term Goals - 08/07/17 1748      PT SHORT TERM GOAL #1   Title  Patient will be independent in home exercise program to improve strength/mobility for better functional independence with ADLs.    Baseline  HEP compliant    Time  2    Period  Weeks    Status  Partially Met      PT SHORT TERM GOAL #2   Title  Pt. will increase L knee flexion to 112 degrees, and extension to -10 to improve ambulatory body mechanics    Baseline  PROM: 123, AROM 115    Time  2    Period  Weeks    Status  Achieved      PT SHORT TERM GOAL #3   Title  Patient will ambulate with SPC in public to impove mobility with least  assistive device.     Baseline  ambulate with SPC    Time  2    Period  Weeks    Status  Achieved      PT SHORT TERM GOAL #4   Title  Patient will ambulate 10 ft with no UE device demonstrating return to previous level of function.     Baseline  ambulate 15 ft with no UE device    Time  2    Period  Weeks    Status  Achieved        PT Long Term Goals - 08/07/17 1749      PT LONG TERM GOAL #1   Title  Patient will improve L knee flexion to 120 degrees and extension to neutral to increase ambulatory body mechanics.     Baseline  11/28: flexion 106, extension -15: 12/26: supine AROM 114, extension -6 1/21: PROM: 123, AROM 115,     Time  4    Period  Weeks    Status  Partially Met      PT LONG TERM GOAL #2   Title  Patient (> 56 years old) will complete five times sit to stand test in < 15 seconds indicating an increased LE strength and improved balance    Baseline  11/28 : 18 seconds with poor weight shift 12/26: 11 seconds with good weight shift.     Time  4    Period  Weeks    Status  Achieved      PT LONG TERM GOAL #3   Title  Patient will increase 10 meter walk test to >1.20ms as to improve gait speed for better community ambulation and to reduce fall risk.    Baseline  11/28: .777m 12/26: 1.59m77mwith cane.     Time  4    Period  Weeks    Status  Achieved    Target Date  09/04/17      PT LONG TERM GOAL #4   Title  Patient will increase lower extremity functional scale to >60/80 to demonstrate improved functional mobility and increased tolerance with ADLs.     Baseline  11/18: 39/80 12/26: 42/80 1/21: 50/80    Time  4    Period  Weeks    Status  Partially Met    Target Date  09/04/17      PT LONG TERM GOAL #5   Title  Patient will increase ABC scale score >80% to demonstrate better functional mobility and better confidence with ADLs.     Baseline  11/28: 36.9 % 12/26: 57.8% 1/21: 63.75    Time  4    Period  Weeks    Status  Partially Met    Target Date  09/04/17       Additional Long Term Goals   Additional Long Term Goals  Yes      PT LONG TERM GOAL #6   Title  Patient will increase six minute walk test distance to >1000 for progression to community ambulator and improve gait ability    Baseline  1/21: 950 ft with cane    Time  4    Period  Weeks    Status  New    Target Date  09/04/17            Plan - 08/07/17 1748    Clinical Impression Statement  Patient progressing with functional ROM. PROM continues to  be greater than AROM in all planes due to continued tightness of LE musculature. PROM flexion 123 supine. LEFS 50/80, ABC 63.75%. 6 min walk test =950 ft with cane, patient demonstrated ability to ambulate without cane for short durations additionally. Patient will continue to benefit from skilled physical therapy to improve L knee ROM and strength for improved quality of life and return to prior level of function.     Rehab Potential  Good    Clinical Impairments Affecting Rehab Potential  (+) motivated, not first knee replacement; (-) lives alone, swelling, age, back pain    PT Frequency  2x / week    PT Duration  4 weeks    PT Treatment/Interventions  Electrical Stimulation;Patient/family education;Cryotherapy;Moist Heat;Neuromuscular re-education;Therapeutic exercise;Manual techniques;ADLs/Self Care Home Management;Ultrasound;DME Instruction;Gait training;Stair training;Functional mobility training;Therapeutic activities;Balance training;Compression bandaging;Taping;Energy conservation;Dry needling;Passive range of motion;Scar mobilization    PT Next Visit Plan  knee ROM and strength, HEP    PT Home Exercise Plan  continue home health, modify next session    Consulted and Agree with Plan of Care  Patient       Patient will benefit from skilled therapeutic intervention in order to improve the following deficits and impairments:  Decreased strength, Decreased activity tolerance, Impaired perceived functional ability, Pain, Decreased  endurance, Decreased range of motion, Difficulty walking, Abnormal gait, Decreased balance, Decreased knowledge of use of DME, Decreased mobility, Decreased scar mobility, Hypomobility, Increased edema, Impaired flexibility, Impaired sensation, Postural dysfunction, Improper body mechanics  Visit Diagnosis: Stiffness of left knee, not elsewhere classified  Other abnormalities of gait and mobility  Muscle weakness (generalized)  Chronic pain of left knee     Problem List Patient Active Problem List   Diagnosis Date Noted  . S/P left TKA 05/16/2017  . S/P total knee replacement 05/16/2017  . Female stress incontinence 05/08/2017  . Pain in pelvis 05/08/2017  . Coronary artery calcification seen on CAT scan 04/09/2017  . Carotid stenosis, asymptomatic, bilateral 04/09/2017  . Mixed hyperlipidemia 04/09/2017  . MCI (mild cognitive impairment) 12/17/2015  . Urinary frequency 08/09/2015  . Hypothyroidism 08/09/2015  . Syncope 08/08/2015  . Posterior vitreous detachment of right eye 07/23/2015  . Hereditary and idiopathic peripheral neuropathy 05/11/2015  . Acute pancreatitis 12/21/2014  . Gallstones 12/21/2014  . Abdominal pain 12/21/2014  . Acute bronchitis due to infection 07/04/2014  . Epiretinal membrane, left 04/12/2012  . Nuclear cataract 04/12/2012  . Left lower quadrant pain 08/16/2011  . Breast pain in female 07/18/2011  . Melanoma of back ([place]) 02/28/2011   Janna Arch, PT, DPT   Janna Arch 08/07/2017, 5:52 PM  [place] MAIN [facility] SERVICES [address] [place], [place], [address] Phone: [phone]   Fax:  [phone]  Name: RUTHER EPHRAIM MRN: [id number] Date of Birth: [birth year]

## 2017-08-09 ENCOUNTER — Ambulatory Visit: Payer: PPO

## 2017-08-09 DIAGNOSIS — M25662 Stiffness of left knee, not elsewhere classified: Secondary | ICD-10-CM

## 2017-08-09 DIAGNOSIS — M25562 Pain in left knee: Secondary | ICD-10-CM

## 2017-08-09 DIAGNOSIS — R2689 Other abnormalities of gait and mobility: Secondary | ICD-10-CM

## 2017-08-09 DIAGNOSIS — M6281 Muscle weakness (generalized): Secondary | ICD-10-CM

## 2017-08-09 DIAGNOSIS — G8929 Other chronic pain: Secondary | ICD-10-CM

## 2017-08-09 NOTE — Therapy (Signed)
[place] MAIN [facility] SERVICES [address] [place], [place], [address] Phone: [phone]   Fax:  [phone]  Physical Therapy Treatment  Patient Details  Name: [name] MRN: [id number] Date of Birth: [birth year] Referring Provider: Mauri Pole , MD   Encounter Date: 08/09/2017  PT End of Session - 08/09/17 1522    Visit Number  13    Number of Visits  16    Date for PT Re-Evaluation  09/04/17    PT Start Time  9678    PT Stop Time  1600    PT Time Calculation (min)  45 min    Equipment Utilized During Treatment  Gait belt    Activity Tolerance  Patient tolerated treatment well;Patient limited by pain    Behavior During Therapy  [facility] for tasks assessed/performed       Past Medical History:  Diagnosis Date  . Acid reflux   . Anemia   . Anxiety    in past  . Arthritis    all over  . Atypical moles   . Bronchitis   . CAD (coronary artery disease)    patient denies on preop of 10/24   . Carotid artery calcification, bilateral 04/2016  . Cataract   . Complication of anesthesia    shoulder surgery- not all the way asleep  . Depression    in past  . Dizziness   . Family history of adverse reaction to anesthesia    sister- has problems with nausea and vomiting   . Fatigue   . Generalized headache    migraines  . GERD (gastroesophageal reflux disease)   . History of chemotherapy    topical cream for h/o skin CA  . History of kidney stones   . Hypothyroidism   . Insomnia   . Irritable bowel syndrome   . LLQ abdominal pain   . Melanoma ([place]) 2013  . Melanoma ([place])    x2 stg II melanoma  . Neuropathy, peripheral   . Peripheral neuropathy   . Pneumonia    hx of x 2  . PONV (postoperative nausea and vomiting)   . Pre-diabetes   . Retina disorder    left  . Stroke [facility])    ? TIA per Dr V per patient   . Thyroid disease   . Weight gain     Past Surgical History:  Procedure Laterality Date  . CHOLECYSTECTOMY  N/A 01/28/2015   Procedure: LAPAROSCOPIC CHOLECYSTECTOMY WITH INTRAOPERATIVE CHOLANGIOGRAM;  Surgeon: Autumn Messing III, MD;  Location: [place];  Service: General;  Laterality: N/A;  . CLOSED MANIPULATION SHOULDER     rt  . COLONOSCOPY WITH PROPOFOL N/A 11/20/2012   Procedure: COLONOSCOPY WITH PROPOFOL;  Surgeon: Garlan Fair, MD;  [facility];  Service: Endoscopy;  Laterality: N/A;  . colonscopy     May 2014  . JOINT REPLACEMENT     RTK  . KNEE ARTHROSCOPY    . left knee miniscus tear repair    . MELANOMA EXCISION WITH SENTINEL LYMPH NODE BIOPSY  03/18/11   Back, nodes neg  . ROTATOR CUFF REPAIR     left  . SHOULDER SURGERY     right  . TOTAL KNEE ARTHROPLASTY     right  . TOTAL KNEE ARTHROPLASTY Left 05/16/2017   Procedure: LEFT TOTAL KNEE ARTHROPLASTY;  Surgeon: Paralee Cancel, MD;  [facility];  Service: Orthopedics;  Laterality: Left;  70 mins  . TUMOR REMOVAL  ovary    There were no vitals filed for this visit.   Supine extension: -3 Supine flexion: 124  Subjective Assessment - 08/09/17 1521    Subjective  Patient was at a funeral home this morning due to her friend's husband passing. Patient seeing surgeon tomorrow for follow up appointment.     Pertinent History  77 yo s/p LTKA with h/o vestibular problems, RTKA several years ago, back pain shoulder surgery back in 1990s, peripheral neuropathy, melanoma, migraines.  Patient had surgery October 30th with no noted complications. Went to [facility] for 10 days. Went home with home health PT, released last Friday. Doctor wants more aggressive therapy two times a week so referred to PT. Pt. Reports icing several times a day.     Limitations  Lifting;Standing;Walking;House hold activities;Other (comment)    How long can you stand comfortably?  5 minutes    How long can you walk comfortably?  needs AD, 5 minutes    Patient Stated Goals  to return to previous level of function    Currently in Pain?  Yes    Pain Score   2     Pain Location  Knee    Pain Orientation  Left    Pain Descriptors / Indicators  Burning    Pain Type  Surgical pain    Pain Onset  1 to 4 weeks ago     Octane seated elliptical level 2 4 minutes   Supine heel slides Ambulate 90 ft x2 without cane Prone hamstring curl 2x 10x   AP mobilization to improve knee extension 10x 10 second mobilizations    Posterior, anterior mobilizations hooklying in supine: 8x 15 seconds   Supine hamstring stretch 60 seconds   Patellar mobilizations: superior, inferior, med, lat 6x 10 seconds   LockHome mechanism mobilizations 8x 10 seconds left and right.  LLE.   Supine gastroc stretch Passive and active    Prone gastroc/soleus rolling to increase ROM.    Supine: knee flexion PROM 8x 10 seconds   Supine : knee extension PROM 8x 10 seconds     Tender spot noted medial posterior aspect of upper calf.     Pt. response to medical necessity:  Patient will continue to benefit from skilled physical therapy to improve L knee ROM and strength for improved quality of life and return to prior level of function                       PT Education - 08/09/17 1522    Education provided  Yes    Education Details  L knee ROM and strength    Person(s) Educated  Patient    Methods  Explanation;Demonstration;Verbal cues    Comprehension  Verbalized understanding;Returned demonstration       PT Short Term Goals - 08/07/17 1748      PT SHORT TERM GOAL #1   Title  Patient will be independent in home exercise program to improve strength/mobility for better functional independence with ADLs.    Baseline  HEP compliant    Time  2    Period  Weeks    Status  Partially Met      PT SHORT TERM GOAL #2   Title  Pt. will increase L knee flexion to 112 degrees, and extension to -10 to improve ambulatory body mechanics    Baseline  PROM: 123, AROM 115    Time  2    Period  Weeks  Status  Achieved      PT SHORT TERM GOAL #3   Title   Patient will ambulate with SPC in public to impove mobility with least assistive device.     Baseline  ambulate with SPC    Time  2    Period  Weeks    Status  Achieved      PT SHORT TERM GOAL #4   Title  Patient will ambulate 10 ft with no UE device demonstrating return to previous level of function.     Baseline  ambulate 15 ft with no UE device    Time  2    Period  Weeks    Status  Achieved        PT Long Term Goals - 08/07/17 1749      PT LONG TERM GOAL #1   Title  Patient will improve L knee flexion to 120 degrees and extension to neutral to increase ambulatory body mechanics.     Baseline  11/28: flexion 106, extension -15: 12/26: supine AROM 114, extension -6 1/21: PROM: 123, AROM 115,     Time  4    Period  Weeks    Status  Partially Met      PT LONG TERM GOAL #2   Title  Patient (> 50 years old) will complete five times sit to stand test in < 15 seconds indicating an increased LE strength and improved balance    Baseline  11/28 : 18 seconds with poor weight shift 12/26: 11 seconds with good weight shift.     Time  4    Period  Weeks    Status  Achieved      PT LONG TERM GOAL #3   Title  Patient will increase 10 meter walk test to >1.24ms as to improve gait speed for better community ambulation and to reduce fall risk.    Baseline  11/28: .734m 12/26: 1.59m75mwith cane.     Time  4    Period  Weeks    Status  Achieved    Target Date  09/04/17      PT LONG TERM GOAL #4   Title  Patient will increase lower extremity functional scale to >60/80 to demonstrate improved functional mobility and increased tolerance with ADLs.     Baseline  11/18: 39/80 12/26: 42/80 1/21: 50/80    Time  4    Period  Weeks    Status  Partially Met    Target Date  09/04/17      PT LONG TERM GOAL #5   Title  Patient will increase ABC scale score >80% to demonstrate better functional mobility and better confidence with ADLs.     Baseline  11/28: 36.9 % 12/26: 57.8% 1/21: 63.75    Time   4    Period  Weeks    Status  Partially Met    Target Date  09/04/17      Additional Long Term Goals   Additional Long Term Goals  Yes      PT LONG TERM GOAL #6   Title  Patient will increase six minute walk test distance to >1000 for progression to community ambulator and improve gait ability    Baseline  1/21: 950 ft with cane    Time  4    Period  Weeks    Status  New    Target Date  09/04/17            Plan -  08/09/17 1552    Clinical Impression Statement  Patient active and passive ROM increasing with repetitive motion as patient is exhibiting increased fascial tightness today due to cold weather and personal loss/grief causing muscular tightness. Patient strength and mobility of L knee continuing to progress towards functional goals.  Patient will continue to benefit from skilled physical therapy to improve L knee ROM and strength for improved quality of life and return to prior level of function    Rehab Potential  Good    Clinical Impairments Affecting Rehab Potential  (+) motivated, not first knee replacement; (-) lives alone, swelling, age, back pain    PT Frequency  2x / week    PT Duration  4 weeks    PT Treatment/Interventions  Electrical Stimulation;Patient/family education;Cryotherapy;Moist Heat;Neuromuscular re-education;Therapeutic exercise;Manual techniques;ADLs/Self Care Home Management;Ultrasound;DME Instruction;Gait training;Stair training;Functional mobility training;Therapeutic activities;Balance training;Compression bandaging;Taping;Energy conservation;Dry needling;Passive range of motion;Scar mobilization    PT Next Visit Plan  knee ROM and strength, HEP    PT Home Exercise Plan  continue home health, modify next session    Consulted and Agree with Plan of Care  Patient       Patient will benefit from skilled therapeutic intervention in order to improve the following deficits and impairments:  Decreased strength, Decreased activity tolerance, Impaired  perceived functional ability, Pain, Decreased endurance, Decreased range of motion, Difficulty walking, Abnormal gait, Decreased balance, Decreased knowledge of use of DME, Decreased mobility, Decreased scar mobility, Hypomobility, Increased edema, Impaired flexibility, Impaired sensation, Postural dysfunction, Improper body mechanics  Visit Diagnosis: Stiffness of left knee, not elsewhere classified  Other abnormalities of gait and mobility  Muscle weakness (generalized)  Chronic pain of left knee     Problem List Patient Active Problem List   Diagnosis Date Noted  . S/P left TKA 05/16/2017  . S/P total knee replacement 05/16/2017  . Female stress incontinence 05/08/2017  . Pain in pelvis 05/08/2017  . Coronary artery calcification seen on CAT scan 04/09/2017  . Carotid stenosis, asymptomatic, bilateral 04/09/2017  . Mixed hyperlipidemia 04/09/2017  . MCI (mild cognitive impairment) 12/17/2015  . Urinary frequency 08/09/2015  . Hypothyroidism 08/09/2015  . Syncope 08/08/2015  . Posterior vitreous detachment of right eye 07/23/2015  . Hereditary and idiopathic peripheral neuropathy 05/11/2015  . Acute pancreatitis 12/21/2014  . Gallstones 12/21/2014  . Abdominal pain 12/21/2014  . Acute bronchitis due to infection 07/04/2014  . Epiretinal membrane, left 04/12/2012  . Nuclear cataract 04/12/2012  . Left lower quadrant pain 08/16/2011  . Breast pain in female 07/18/2011  . Melanoma of back ([place]) 02/28/2011   Janna Arch, PT, DPT   Janna Arch 08/09/2017, 4:00 PM  [place] MAIN [facility] SERVICES [address] [place], [place], [address] Phone: [phone]   Fax:  [phone]  Name: [name] MRN: [id number] Date of Birth: 12/17/1940

## 2017-08-10 DIAGNOSIS — M25551 Pain in right hip: Secondary | ICD-10-CM | POA: Diagnosis not present

## 2017-08-10 DIAGNOSIS — Z96652 Presence of left artificial knee joint: Secondary | ICD-10-CM | POA: Diagnosis not present

## 2017-08-10 DIAGNOSIS — Z471 Aftercare following joint replacement surgery: Secondary | ICD-10-CM | POA: Diagnosis not present

## 2017-08-14 ENCOUNTER — Ambulatory Visit: Payer: PPO

## 2017-08-16 ENCOUNTER — Ambulatory Visit: Payer: PPO

## 2017-08-22 ENCOUNTER — Ambulatory Visit: Payer: PPO

## 2017-08-28 ENCOUNTER — Other Ambulatory Visit: Payer: Self-pay | Admitting: Gastroenterology

## 2017-09-07 DIAGNOSIS — D0472 Carcinoma in situ of skin of left lower limb, including hip: Secondary | ICD-10-CM | POA: Diagnosis not present

## 2017-09-07 DIAGNOSIS — D485 Neoplasm of uncertain behavior of skin: Secondary | ICD-10-CM | POA: Diagnosis not present

## 2017-09-07 DIAGNOSIS — L821 Other seborrheic keratosis: Secondary | ICD-10-CM | POA: Diagnosis not present

## 2017-09-07 DIAGNOSIS — Z23 Encounter for immunization: Secondary | ICD-10-CM | POA: Diagnosis not present

## 2017-09-07 DIAGNOSIS — Z808 Family history of malignant neoplasm of other organs or systems: Secondary | ICD-10-CM | POA: Diagnosis not present

## 2017-09-07 DIAGNOSIS — L570 Actinic keratosis: Secondary | ICD-10-CM | POA: Diagnosis not present

## 2017-09-07 DIAGNOSIS — Z87898 Personal history of other specified conditions: Secondary | ICD-10-CM | POA: Diagnosis not present

## 2017-09-07 DIAGNOSIS — Z85828 Personal history of other malignant neoplasm of skin: Secondary | ICD-10-CM | POA: Diagnosis not present

## 2017-09-07 DIAGNOSIS — Z85820 Personal history of malignant melanoma of skin: Secondary | ICD-10-CM | POA: Diagnosis not present

## 2017-09-11 ENCOUNTER — Ambulatory Visit: Payer: PPO

## 2017-09-22 ENCOUNTER — Encounter: Payer: Self-pay | Admitting: Neurology

## 2017-09-22 ENCOUNTER — Ambulatory Visit: Payer: PPO | Admitting: Neurology

## 2017-09-22 VITALS — BP 130/80 | HR 66 | Ht 67.0 in | Wt 185.1 lb

## 2017-09-22 DIAGNOSIS — G609 Hereditary and idiopathic neuropathy, unspecified: Secondary | ICD-10-CM | POA: Diagnosis not present

## 2017-09-22 DIAGNOSIS — G3184 Mild cognitive impairment, so stated: Secondary | ICD-10-CM | POA: Diagnosis not present

## 2017-09-22 DIAGNOSIS — G250 Essential tremor: Secondary | ICD-10-CM | POA: Diagnosis not present

## 2017-09-22 NOTE — Patient Instructions (Addendum)
Start using a cane for your balance  Return to clinic in 1 year   You have been referred for a neurocognitive evaluation in our office.   The evaluation takes approximately two hours. The first part of the appointment is a clinical interview with the neuropsychologist (Dr. Macarthur Critchley). Please bring someone with you to this appointment if possible, as it is helpful for Dr. Si Raider to hear from both you and another adult who knows you well. After speaking with Dr. Si Raider, you will complete testing with her technician. The testing includes a variety of tasks- mostly question-and-answer, some paper-and-pencil. There is nothing you need to do to prepare for this appointment, but having a good night's sleep prior to the testing, and bringing eyeglasses and hearing aids (if you wear them), is advised.   About a week after the evaluation, you will return to follow up with Dr. Si Raider to review the test results. This appointment is about 30 minutes. If you would like a family member to receive this information as well, please bring them to the appointment.   We have to reserve several hours of the neuropsychologist's time and the psychometrician's time for your evaluation appointment. As such, please note that there is a No-Show fee of $100. If you are unable to attend any of your appointments, please contact our office as soon as possible to reschedule.

## 2017-09-22 NOTE — Progress Notes (Signed)
Follow-up Visit   Date: 09/22/17    ADDISSON [name] MRN: [id number] DOB: [birth year]   Interim History: [name] is a 77 y.o. left-handed Caucasian female with GERD, hypothyroidism, stage IA malignant melanoma of the back (2012) s/p excision, idiopathic neuropathy, vestibular neuritis (followed by ENT) and GERD returning to the clinic with complains of memory changes.  The patient was accompanied to the clinic by self.  History of present illness: Neuropathy started in 1990s and was seeing Dr. Erling Cruz at [facility] since 2008. Symptoms are described as numbness, burning, and stinging sensation involving the legs and fingertips. Numbness is at the level of her mid-calf.. She was taking gabapentin 100mg  at bedtime which helped her sleep but did not provide any relief and stopped it. She was briefly taking Lyrica, but did not continue due to side effects. Previously evaluation has included normal serum protein electrophoresis, borderline high TSH, normal RA, ACE , ANA, sedimentation rate, and vitamin B12. NCS/EMG was also done, but results are unknown.   She has a strong family history of Alzheimer's disease.  She began having spells of feeling lightheaded in January 2017 and presented to the Emergency Department after experiencing four presyncopal events.  MRI of brain not reveal intracranial mass lesion, but there was an indeterminate 3 mm lesion at the tip of the dens and recommended a short interval follow-up study in 3 months given history of melanoma.  Repeat MRI brain shows stable lesion of the dens, most like due to arthritis.  In late 2017, she began having problems with short-term memory and forgetting appointments.  She was referred for neurocognitive testing and cancelled the appointment.  UPDATE 09/22/2017:  She is here for 1 year follow-up visit. She continues to have imbalance, has noticed new left hand tremors, and new complainrs with letter substitution when writing.  She is  still highly independent and manages her own finances, medications, and household chores.  She is driving and has not been involved in any MVAs.  A few times, she has found herself forgetting directions and having to pull off to the side, until she remembers again.  On one occasion, she accidentally forget the time of her hair appointment and went a day later.  She is very anxious about dementia because of her family history.  She continues to be worried that her memory changes are due to the abnormality seen involving her dens, despite me reassuring that this is a boney abnormality. She cancelled her neurocogntive testing which was ordered the last time, but is agreeable to keep the appointment now.   Left hand tremors sometimes interfere with fine motor tasks, such as applying makeup and putting on jewelry. There is no family history of tremors.   She also complains of blurred vision and will be seeing an eye doctor in May.     Medications:  Current Outpatient Medications on File Prior to Visit  Medication Sig Dispense Refill  . albuterol (PROVENTIL HFA;VENTOLIN HFA) 108 (90 BASE) MCG/ACT inhaler Inhale 1-2 puffs into the lungs every 6 (six) hours as needed for wheezing or shortness of breath.    . Ascorbic Acid (VITAMIN C) 1000 MG tablet Take 1,000 mg by mouth daily.    Marland Kitchen aspirin 325 MG EC tablet Take 325 mg 2 (two) times daily by mouth.    Marland Kitchen atorvastatin (LIPITOR) 10 MG tablet Take 10 mg daily by mouth.     . Biotin 1000 MCG tablet Take 1,000 mcg by mouth daily.     Marland Kitchen  Calcium 600-200 MG-UNIT tablet Take 1 tablet daily by mouth.    . Cholecalciferol (VITAMIN D3) 2000 UNITS capsule Take 2,000 Units by mouth daily.    Marland Kitchen dextromethorphan-guaiFENesin (MUCINEX DM) 30-600 MG 12hr tablet Take 1 tablet 2 (two) times daily by mouth.     . docusate sodium (COLACE) 100 MG capsule Take 1 capsule (100 mg total) by mouth 2 (two) times daily. 10 capsule 0  . ferrous sulfate (FERROUSUL) 325 (65 FE) MG tablet  Take 1 tablet (325 mg total) by mouth 3 (three) times daily with meals.  3  . folic acid (FOLVITE) 063 MCG tablet Take 800 mcg by mouth daily.    Marland Kitchen HYDROcodone-acetaminophen (NORCO) 7.5-325 MG tablet Take 1-2 tablets by mouth every 4 (four) hours as needed for moderate pain or severe pain. 120 tablet 0  . levothyroxine (SYNTHROID) 50 MCG tablet Take 1 tablet (50 mcg total) by mouth daily before breakfast. 30 tablet 1  . methocarbamol (ROBAXIN) 500 MG tablet Take 1 tablet (500 mg total) by mouth every 6 (six) hours as needed for muscle spasms. 40 tablet 0  . Multiple Vitamins-Minerals (PRESERVISION AREDS 2 PO) Take 1 capsule by mouth daily.    . Omega-3 Fatty Acids (FISH OIL) 1000 MG CAPS Take 1,000 mg by mouth daily.     Marland Kitchen omeprazole (PRILOSEC) 20 MG capsule TAKE 1 CAPSULE (20 MG TOTAL) BY MOUTH DAILY. 90 capsule 0  . polyethylene glycol (MIRALAX / GLYCOLAX) packet Take 17 g by mouth 2 (two) times daily. 14 each 0  . senna (SENOKOT) 8.6 MG TABS tablet Take 2 tablets 2 (two) times daily by mouth.    . sertraline (ZOLOFT) 25 MG tablet Take 25 mg daily by mouth.     . vitamin B-12 (CYANOCOBALAMIN) 1000 MCG tablet Take 2,000 mcg daily by mouth. 2 tabs     No current facility-administered medications on file prior to visit.     Allergies:  Allergies  Allergen Reactions  . Epinephrine Other (See Comments) and Anaphylaxis    Heart palpitations  . Penicillins Swelling and Other (See Comments)    Lip swelling Has patient had a PCN reaction causing immediate rash, facial/tongue/throat swelling, SOB or lightheadedness with hypotension: Yes Has patient had a PCN reaction causing severe rash involving mucus membranes or skin necrosis: No Has patient had a PCN reaction that required hospitalization No Has patient had a PCN reaction occurring within the last 10 years: No If all of the above answers are "NO", then may proceed with Cephalosporin use.  . Other     Black pepper- caused bronchial tubes to  start closing   . Valium [Diazepam] Other (See Comments)    Other reaction(s): Other (See Comments) Overly sensitive  sedation    Review of Systems:  CONSTITUTIONAL: No fevers, chills, night sweats, or weight loss.  EYES: +visual changes or eye pain ENT: No hearing changes.  No history of nose bleeds.   RESPIRATORY: No cough, wheezing and shortness of breath.   CARDIOVASCULAR: Negative for chest pain, and palpitations.   GI: Negative for abdominal discomfort, blood in stools or black stools.  No recent change in bowel habits.   GU:  No history of incontinence.   MUSCLOSKELETAL: No history of joint pain or swelling.  No myalgias.   SKIN: Negative for lesions, rash, and itching.   ENDOCRINE: Negative for cold or heat intolerance, polydipsia or goiter.   PSYCH:  No depression +anxiety symptoms.   NEURO: As Above.   Vital  Signs:  BP 130/80   Pulse 66   Ht 5\' 7"  (1.702 m)   Wt 185 lb 2 oz (84 kg)   SpO2 94%   BMI 28.99 kg/m   Neurological Exam: MENTAL STATUS including orientation to time, place, person, recent and remote memory, attention span and concentration, language, and fund of knowledge is normal.  Speech is not dysarthric. Montreal Cognitive Assessment  09/22/2017 12/17/2015 12/17/2015  Visuospatial/ Executive (0/5) 4 4 4   Naming (0/3) 3 3 3   Attention: Read list of digits (0/2) 2 2 2   Attention: Read list of letters (0/1) 1 1 1   Attention: Serial 7 subtraction starting at 100 (0/3) 2 3 3   Language: Repeat phrase (0/2) 2 2 2   Language : Fluency (0/1) 0 1 1  Abstraction (0/2) 2 2 2   Delayed Recall (0/5) 4 3 3   Orientation (0/6) 6 6 6   Total 26 27 27   Adjusted Score (based on education) 26 - 27   CRANIAL NERVES:   Face is symmetric. Palate elevates symmetrically.  Tongue is midline.  MOTOR:  Motor strength is 5/5 in all extremities    MSRs:  Reflexes are 2+/4 throughout, except 1+ at the Achilles bilaterally.  SENSORY:  Vibration diminished distal to knees  bilaterally.  COORDINATION/GAIT:  Gait is unsteady at times, unassisted.  Data: Peripheral vascular studies 02/13/2015: Normal ABI  MRI brain and cervical spine 11/10/2014: No acute infarct. Remote right frontal lobe infarct with encephalomalacia. Prominent small vessel disease type changes representing significant progression since prior exam. No intracranial mass lesion noted on this unenhanced exam. Cervical spondylotic changes upper cervical spine most prominent C4-5 level.  MRI brain 08/07/2015: 1. No intracranial mass lesion or abnormal enhancement. 2. Previously noted indeterminate 3 mm lesion at the tip of the dens demonstrates homogeneous postcontrast enhancement. Given the location and morphology of this lesion, this is favored to be degenerative in in nature. However, given the history of melanoma, a short interval follow-up study in 3 months is recommended to document stability to ensure no underlying metastatic lesion is present.  MRI brain wwo contrast 10/19/2015: Negative for metastatic disease to the brain. No acute infarct. Moderate to advanced chronic microvascular ischemia. Large joint effusion of the right TMJ. This is unchanged and may be related to degenerative change or rheumatoid arthritis 3 mm enhancing lesion the dens is stable and most likely related to arthritis. Question rheumatoid arthritis Negative for metastatic disease to the brain.  NCS/EMG of the upper extremities 01/05/2016: 1. Chronic C6, C7, and C8 radiculopathy affecting right upper extremity, mild in degree electrically. 2. Chronic C8 radiculopathy affecting the left upper extremity, mild in degree electrically. 3. Incidentally, there is a Martin-Gruber anastomosis bilaterally, a normal variant.  US carotids 05/12/2016:  Less than 50% stenosis in the right and left internal carotid arteries.  IMPRESSION/PLAN: 1.  Mild cognitive impairment, stable.  She remains independent with all IADLs, but is  having some difficulty with episodic memory and language.  There is an overlay of anxiety which may also be contributing to her memory changes.  - She had cancelled her last neuropsychological testing, but is agreeable to proceed.    2. Idiopathic peripheral neuropathy, diagnosed 1990s, manifesting predominately with paresthesias following a glove-stocking distribution - stable.  Again, she was encouraged to wear flat shoes and use a cane to minimize risk of falls.  3. Left hand essential tremor, mild.  Reassurance provided that she has no signs of parkinson's disease.  Follow clinically, if it  gets worse we can start medication for benign essential tremor  Return to clinic in 1 year  Greater than 50% of this 25 minute visit was spent in counseling, explanation of diagnosis, planning of further management, and coordination of care.   Thank you for allowing me to participate in patient's care.  If I can answer any additional questions, I would be pleased to do so.    Sincerely,    Donika K. Posey Pronto, DO

## 2017-09-28 DIAGNOSIS — D0472 Carcinoma in situ of skin of left lower limb, including hip: Secondary | ICD-10-CM | POA: Diagnosis not present

## 2017-10-13 ENCOUNTER — Ambulatory Visit: Payer: PPO | Admitting: Neurology

## 2017-10-17 DIAGNOSIS — E785 Hyperlipidemia, unspecified: Secondary | ICD-10-CM | POA: Diagnosis not present

## 2017-10-17 DIAGNOSIS — F411 Generalized anxiety disorder: Secondary | ICD-10-CM | POA: Diagnosis not present

## 2017-10-17 DIAGNOSIS — E559 Vitamin D deficiency, unspecified: Secondary | ICD-10-CM | POA: Diagnosis not present

## 2017-10-17 DIAGNOSIS — R42 Dizziness and giddiness: Secondary | ICD-10-CM | POA: Diagnosis not present

## 2017-10-17 DIAGNOSIS — E039 Hypothyroidism, unspecified: Secondary | ICD-10-CM | POA: Diagnosis not present

## 2017-10-17 DIAGNOSIS — R102 Pelvic and perineal pain: Secondary | ICD-10-CM | POA: Diagnosis not present

## 2017-10-17 DIAGNOSIS — I2584 Coronary atherosclerosis due to calcified coronary lesion: Secondary | ICD-10-CM | POA: Diagnosis not present

## 2017-10-17 DIAGNOSIS — R350 Frequency of micturition: Secondary | ICD-10-CM | POA: Diagnosis not present

## 2017-10-19 DIAGNOSIS — Z803 Family history of malignant neoplasm of breast: Secondary | ICD-10-CM | POA: Diagnosis not present

## 2017-10-19 DIAGNOSIS — Z1231 Encounter for screening mammogram for malignant neoplasm of breast: Secondary | ICD-10-CM | POA: Diagnosis not present

## 2017-10-19 LAB — HM MAMMOGRAPHY

## 2017-11-03 ENCOUNTER — Ambulatory Visit: Payer: PPO | Admitting: Neurology

## 2017-11-22 DIAGNOSIS — D0472 Carcinoma in situ of skin of left lower limb, including hip: Secondary | ICD-10-CM | POA: Diagnosis not present

## 2017-11-22 DIAGNOSIS — L570 Actinic keratosis: Secondary | ICD-10-CM | POA: Diagnosis not present

## 2017-11-22 DIAGNOSIS — D485 Neoplasm of uncertain behavior of skin: Secondary | ICD-10-CM | POA: Diagnosis not present

## 2017-11-27 ENCOUNTER — Other Ambulatory Visit: Payer: Self-pay | Admitting: Gastroenterology

## 2017-12-14 DIAGNOSIS — L570 Actinic keratosis: Secondary | ICD-10-CM | POA: Diagnosis not present

## 2017-12-14 DIAGNOSIS — H2513 Age-related nuclear cataract, bilateral: Secondary | ICD-10-CM | POA: Diagnosis not present

## 2017-12-14 DIAGNOSIS — H35363 Drusen (degenerative) of macula, bilateral: Secondary | ICD-10-CM | POA: Diagnosis not present

## 2017-12-14 DIAGNOSIS — H43811 Vitreous degeneration, right eye: Secondary | ICD-10-CM | POA: Diagnosis not present

## 2017-12-14 DIAGNOSIS — D0472 Carcinoma in situ of skin of left lower limb, including hip: Secondary | ICD-10-CM | POA: Diagnosis not present

## 2017-12-14 DIAGNOSIS — H35372 Puckering of macula, left eye: Secondary | ICD-10-CM | POA: Diagnosis not present

## 2018-01-23 ENCOUNTER — Telehealth: Payer: Self-pay | Admitting: Podiatry

## 2018-01-23 NOTE — Telephone Encounter (Signed)
I informed pt she should go to salon, or come to see one of our doctors. Pt states she has done for years, but this is the best they have stayed on, she will try to soak off and then call a salon.

## 2018-01-23 NOTE — Telephone Encounter (Signed)
Needs to know what to use to get artificial toenails off.

## 2018-01-30 DIAGNOSIS — H9201 Otalgia, right ear: Secondary | ICD-10-CM | POA: Diagnosis not present

## 2018-01-30 DIAGNOSIS — J40 Bronchitis, not specified as acute or chronic: Secondary | ICD-10-CM | POA: Diagnosis not present

## 2018-02-12 ENCOUNTER — Ambulatory Visit: Payer: PPO | Admitting: Podiatry

## 2018-02-22 ENCOUNTER — Ambulatory Visit: Payer: PPO | Admitting: Psychology

## 2018-02-22 ENCOUNTER — Encounter: Payer: Self-pay | Admitting: Psychology

## 2018-02-22 ENCOUNTER — Ambulatory Visit (INDEPENDENT_AMBULATORY_CARE_PROVIDER_SITE_OTHER): Payer: PPO | Admitting: Psychology

## 2018-02-22 DIAGNOSIS — F329 Major depressive disorder, single episode, unspecified: Secondary | ICD-10-CM

## 2018-02-22 DIAGNOSIS — R413 Other amnesia: Secondary | ICD-10-CM | POA: Diagnosis not present

## 2018-02-22 DIAGNOSIS — F32A Depression, unspecified: Secondary | ICD-10-CM

## 2018-02-22 DIAGNOSIS — G3184 Mild cognitive impairment, so stated: Secondary | ICD-10-CM

## 2018-02-22 NOTE — Progress Notes (Signed)
   Neuropsychology Note  [name] completed 60 minutes of neuropsychological testing with technician, Milana Kidney, BS, under the supervision of Dr. Macarthur Critchley, Licensed Psychologist. The patient did not appear overtly distressed by the testing session, per behavioral observation or via self-report to the technician. Rest breaks were offered.   Clinical Decision Making: In considering the patient's current level of functioning, level of presumed impairment, nature of symptoms, emotional and behavioral responses during the interview, level of literacy, and observed level of motivation/effort, a battery of tests was selected and communicated to the psychometrician.  Communication between the psychologist and technician was ongoing throughout the testing session and changes were made as deemed necessary based on patient performance on testing, technician observations and additional pertinent factors such as those listed above.  [name] will return within approximately 2 weeks for an interactive feedback session with Dr. Si Raider at which time her test performances, clinical impressions and treatment recommendations will be reviewed in detail. The patient understands she can contact our office should she require our assistance before this time.  35 minutes spent performing neuropsychological evaluation services/clinical decision making (psychologist). [CPT 27782] 60 minutes spent face-to-face with patient administering standardized tests, 30 minutes spent scoring (technician). [CPT [id number], 42353]  Full report to follow.

## 2018-02-22 NOTE — Progress Notes (Signed)
NEUROBEHAVIORAL STATUS EXAM   Name: [name] (goes by "[name]") Date of Birth: [birth year] Date of Interview: 02/22/2018  Reason for Referral:  [name] (goes by "[name]") is a 77 y.o. female who is referred for neuropsychological evaluation by Dr. Narda Amber of [facility] due to concerns about memory changes. This patient is unaccompanied in the office for today's visit.  History of Presenting Problem:  [name] has been followed by Dr. Posey Pronto since 04/2015 for neuropathy and imbalance but the patient has also reported memory concerns. She was most recently seen by Dr. Posey Pronto on 09/22/2017 and reported new left hand tremors as well as more memory concerns and anxiety about dementia. She noted a strong family history of dementia. MoCA was 26/30. Hand tremors were thought represent essential tremor, mild, and not Parkinson's disease. The patient had been referred for neurocognitive testing in the past (2017) but she cancelled the appointment.  Ms. Job reports that she has experienced memory problems at various times in her life when under significant stress. She reported that in 2008/2009 she had neurocognitive evaluation with Dr. Valentina Shaggy and the results were "inconclusive" because she was under significant stress at the time. Symptoms did improve when stressful situation was improved. However in the past few years she has become concerned again, although symptoms are not as bad as they were in 2008/2009. She reports that she is less certain of directions when driving and has had to pull over to think it through or stop and ask for directions. She also has had some difficulty keeping track of appointments. For example, she went to her hair stylist on a Friday thinking she had an appointment, but her hair stylist has never worked on Fridays. The patient lives alone and continues to manage all instrumental ADLs. She denies any trouble managing her medications, finances/bills, or cooking.    Upon direct questioning, the patient reported the following with regard to current cognitive functioning:   Forgetting recent conversations/events: Yes Repeating statements/questions: Yes Misplacing/losing items: Yes Forgetting appointments or other obligations: Yes, on occasion Forgetting to take medications: no  Difficulty concentrating: A little bit Starting but not finishing tasks: Yes, but have always been this way Processing information more slowly: Yes  Word-finding difficulty: Yes Writing difficulty: Yes, in the past year she will notice that she goes to write a word and writes the wrong letter Spelling difficulty: Some Comprehension difficulty: No  The patient has a history of depression, this appears mainly related to a very difficult family situation she went through that continues to affect her and her children. She has also had more recent distress related to tragic deaths of a few friends/people she knows. She reports that she frequently experiences depressed and anxious mood, "at times off the chart". She takes sertraline. She has had some counseling in the past but is not currently seeing a therapist. 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 She is also less social now, which is not typical for her, but she feels this is related to the community she is living in. She is interested in moving and in fact is looking at houses to buy.  She has sleep difficulty characterized by difficulty with both falling and staying asleep. She used to take Aleve PM and melatonin but stopped taking both of these for fear she was becoming to reliant on them. However, on occasion she has an orange juice  and vodka to help her sleep. She does not drink heavily, she has wine on occasion.    She has peripheral neuropathy, particularly bothersome in her legs and feet. She also has a vestibular problem. She has difficulty with balance.  However, she has not had any falls.   The patient has a family history of Alzheimer's disease or dementia in 5 paternal aunts. Her parents did not have dementia.  Brain MRI completed 10/19/2015 reportedly revealed the following: Negative for metastatic disease to the brain; No acute infarct; Moderate to advanced chronic microvascular ischemia; 3 mm enhancing lesion the dens is stable and most likely related to arthritis.   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 She is now retired. Marital history: Divorced x1. Her ex-husband died in prison. Children: She has 3 adult children (1 son and 2 daughters).  Alcohol: Wine socially.  Tobacco: None   Medical History: Past Medical History:  Diagnosis Date  . Acid reflux   . Anemia   . Anxiety    in past  . Arthritis    all over  . Atypical moles   . Bronchitis   . CAD (coronary artery disease)    patient denies on preop of 10/24   . Carotid artery calcification, bilateral 04/2016  . Cataract   . Complication of anesthesia    shoulder surgery- not all the way asleep  . Depression    in past  . Dizziness   . Family history of adverse reaction to anesthesia    sister- has problems with nausea and vomiting   . Fatigue   . Generalized headache    migraines  . GERD (gastroesophageal reflux disease)   . History of chemotherapy    topical cream for h/o skin CA  . History of kidney stones   . Hypothyroidism   . Insomnia   . Irritable bowel syndrome   . LLQ abdominal pain   . Melanoma ([place]) 2013  . Melanoma ([place])    x2 stg II melanoma  . Neuropathy, peripheral   . Peripheral neuropathy   . Pneumonia    hx of x 2  . PONV (postoperative nausea and vomiting)   . Pre-diabetes   . Retina disorder    left  . Stroke [facility])    ? TIA per Dr V  per patient   . Thyroid disease   . Weight gain      Current Medications:  Outpatient Encounter Medications as of 02/22/2018  Medication Sig  . albuterol (PROVENTIL HFA;VENTOLIN HFA) 108 (90 BASE) MCG/ACT inhaler Inhale 1-2 puffs into the lungs every 6 (six) hours as needed for wheezing or shortness of breath.  . Ascorbic Acid (VITAMIN C) 1000 MG tablet Take 1,000 mg by mouth daily.  Marland Kitchen aspirin 325 MG EC tablet Take 325 mg 2 (two) times daily by mouth.  Marland Kitchen atorvastatin (LIPITOR) 10 MG tablet Take 10 mg daily by mouth.   . Biotin 1000 MCG tablet Take 1,000 mcg by mouth daily.   . Calcium 600-200 MG-UNIT tablet Take 1 tablet daily by mouth.  . Cholecalciferol (VITAMIN D3) 2000 UNITS capsule Take 2,000 Units by mouth daily.  Marland Kitchen dextromethorphan-guaiFENesin (MUCINEX DM) 30-600 MG 12hr tablet Take 1 tablet 2 (two) times daily by mouth.   . docusate sodium (COLACE) 100 MG capsule Take  1 capsule (100 mg total) by mouth 2 (two) times daily.  . ferrous sulfate (FERROUSUL) 325 (65 FE) MG tablet Take 1 tablet (325 mg total) by mouth 3 (three) times daily with meals.  . folic acid (FOLVITE) 828 MCG tablet Take 800 mcg by mouth daily.  Marland Kitchen HYDROcodone-acetaminophen (NORCO) 7.5-325 MG tablet Take 1-2 tablets by mouth every 4 (four) hours as needed for moderate pain or severe pain.  Marland Kitchen levothyroxine (SYNTHROID) 50 MCG tablet Take 1 tablet (50 mcg total) by mouth daily before breakfast.  . methocarbamol (ROBAXIN) 500 MG tablet Take 1 tablet (500 mg total) by mouth every 6 (six) hours as needed for muscle spasms.  . Multiple Vitamins-Minerals (PRESERVISION AREDS 2 PO) Take 1 capsule by mouth daily.  . Omega-3 Fatty Acids (FISH OIL) 1000 MG CAPS Take 1,000 mg by mouth daily.   Marland Kitchen omeprazole (PRILOSEC) 20 MG capsule TAKE 1 CAPSULE (20 MG TOTAL) BY MOUTH DAILY.  Marland Kitchen polyethylene glycol (MIRALAX / GLYCOLAX) packet Take 17 g by mouth 2 (two) times daily.  Marland Kitchen senna (SENOKOT) 8.6 MG TABS tablet Take 2 tablets 2 (two) times  daily by mouth.  . sertraline (ZOLOFT) 25 MG tablet Take 25 mg daily by mouth.   . vitamin B-12 (CYANOCOBALAMIN) 1000 MCG tablet Take 2,000 mcg daily by mouth. 2 tabs   No facility-administered encounter medications on file as of 02/22/2018.      Behavioral Observations:   Appearance: Neatly and appropriately dressed and groomed, appearing younger than her stated age Gait: Ambulated independently, mild unsteadiness/ataxia observed Speech: Fluent; normal rate, rhythm and volume. Mild to moderate word finding difficulty. Thought process: Linear, goal directed Affect: Full, mildly anxious Interpersonal: Very pleasant, appropriate   60 minutes spent face-to-face with patient completing neurobehavioral status exam. 40 minutes spent integrating medical records/clinical data and completing this report. [id number] unit; [id number] unit.   TESTING: There is medical necessity to proceed with neuropsychological assessment as the results will be used to aid in differential diagnosis and clinical decision-making and to inform specific treatment recommendations. Per the patient and medical records reviewed, there has been a change in cognitive functioning and a reasonable suspicion of neurocognitive disorder (rule out MCI versus pseudodementia due to depression/anxiety).  Clinical Decision Making: In considering the patient's current level of functioning, level of presumed impairment, nature of symptoms, emotional and behavioral responses during the interview, level of literacy, and observed level of motivation, a battery of tests was selected and communicated to the psychometrician.   Following the clinical interview/neurobehavioral status exam, the patient completed this full battery of neuropsychological testing with my psychometrician under my supervision (see separate note).   PLAN: The patient will return to see me for a follow-up session at which time her test performances and my impressions and  treatment recommendations will be reviewed in detail.  Evaluation ongoing; full report to follow.

## 2018-02-23 ENCOUNTER — Other Ambulatory Visit: Payer: Self-pay | Admitting: Gastroenterology

## 2018-02-26 ENCOUNTER — Ambulatory Visit
Admission: RE | Admit: 2018-02-26 | Discharge: 2018-02-26 | Disposition: A | Discharge status: Home / Self Care | Payer: PPO | Source: Ambulatory Visit | Attending: Internal Medicine | Admitting: Internal Medicine

## 2018-02-26 ENCOUNTER — Other Ambulatory Visit: Payer: Self-pay | Admitting: Internal Medicine

## 2018-02-26 DIAGNOSIS — R05 Cough: Secondary | ICD-10-CM | POA: Diagnosis not present

## 2018-02-26 DIAGNOSIS — J410 Simple chronic bronchitis: Secondary | ICD-10-CM | POA: Diagnosis not present

## 2018-02-27 ENCOUNTER — Other Ambulatory Visit: Payer: Self-pay | Admitting: Gastroenterology

## 2018-03-12 NOTE — Progress Notes (Signed)
NEUROPSYCHOLOGICAL EVALUATION   Name:    [name] (goes by "[name]")  Date of Birth:   28-[name]-1941 Date of Interview:  02/22/2018 Date of Testing:  02/22/2018   Date of Feedback:  03/13/2018       Background Information:  Reason for Referral:  [name] is a 77 y.o. female referred by Dr. Narda Amber to assess her current level of cognitive functioning and assist in differential diagnosis. The current evaluation consisted of a review of available medical records, an interview with the patient, and the completion of a neuropsychological testing battery. Informed consent was obtained.  History of Presenting Problem:  [name] has been followed by Dr. Posey Pronto since 04/2015 for neuropathy and imbalance but the patient has also reported memory concerns. She was most recently seen by Dr. Posey Pronto on 09/22/2017 and reported new left hand tremors as well as more memory concerns and anxiety about dementia. She noted a strong family history of dementia. MoCA was 26/30. Hand tremors were thought represent essential tremor, mild, and not Parkinson's disease. The patient had been referred for neurocognitive testing in the past (2017) but she cancelled the appointment.  [name] reports that she has experienced memory problems at various times in her life when under significant stress. She reported that in 2008/2009 she had neurocognitive evaluation with Dr. Valentina Shaggy and the results were "inconclusive" because she was under significant stress at the time. Symptoms did improve when stressful situation was improved. However in the past few years she has become concerned again, although symptoms are not as bad as they were in 2008/2009. She reports that she is less certain of directions when driving and has had to pull over to think it through or stop and ask for directions. She also has had some difficulty keeping track of appointments. For example, she went to her hair stylist on a Friday thinking she had an  appointment, but her hair stylist has never worked on Fridays. The patient lives alone and continues to manage all instrumental ADLs. She denies any trouble managing her medications, finances/bills, or cooking.   Upon direct questioning, the patient reported the following with regard to current cognitive functioning:   Forgetting recent conversations/events: Yes Repeating statements/questions: Yes Misplacing/losing items: Yes Forgetting appointments or other obligations: Yes, on occasion Forgetting to take medications: no  Difficulty concentrating: A little bit Starting but not finishing tasks: Yes, but have always been this way Processing information more slowly: Yes  Word-finding difficulty: Yes Writing difficulty: Yes, in the past year she will notice that she goes to write a word and writes the wrong letter Spelling difficulty: Some Comprehension difficulty: No  The patient has a history of depression, this appears mainly related to a very difficult family situation she went through that continues to affect her and her children. She has also had more recent distress related to tragic deaths of a few friends/people she knows. She reports that she frequently experiences depressed and anxious mood, "at times off the chart". She takes sertraline. She has had some counseling in the past but is not currently seeing a therapist. 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 She is also less social now, which is not typical for her, but she feels this is related to the community she is living in. She is interested in moving and in fact is looking at houses to buy.  She has sleep difficulty  characterized by difficulty with both falling and staying asleep. She used to take Aleve PM and melatonin but stopped taking both of these for fear she was becoming to reliant on them. However, on occasion she has an orange juice and vodka to help  her sleep. She does not drink heavily, she has wine on occasion.    She has peripheral neuropathy, particularly bothersome in her legs and feet. She also has a vestibular problem. She has difficulty with balance. However, she has not had any falls.   The patient has a family history of Alzheimer's disease or dementia in 5 paternal aunts. Her parents did not have dementia.  Brain MRI completed 10/19/2015 reportedly revealed the following: Negative for metastatic disease to the brain; No acute infarct; Moderate to advanced chronic microvascular ischemia; 3 mm enhancing lesion the dens is stable and most likely related to arthritis.   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 She is now retired. Marital history: Divorced x1. Her ex-husband died in prison. Children: She has 3 adult children (1 son and 2 daughters).  Alcohol: Wine socially.  Tobacco: None   Medical History:  Past Medical History:  Diagnosis Date  . Acid reflux   . Anemia   . Anxiety    in past  . Arthritis    all over  . Atypical moles   . Bronchitis   . CAD (coronary artery disease)    patient denies on preop of 10/24   . Carotid artery calcification, bilateral 04/2016  . Cataract   . Complication of anesthesia    shoulder surgery- not all the way asleep  . Depression    in past  . Dizziness   . Family history of adverse reaction to anesthesia    sister- has problems with nausea and vomiting   . Fatigue   . Generalized headache    migraines  . GERD (gastroesophageal reflux disease)   . History of chemotherapy    topical cream for h/o skin CA  . History of kidney stones   . Hypothyroidism   . Insomnia   . Irritable bowel syndrome   . LLQ abdominal pain   . Melanoma ([place]) 2013  . Melanoma ([place])    x2 stg II  melanoma  . Neuropathy, peripheral   . Peripheral neuropathy   . Pneumonia    hx of x 2  . PONV (postoperative nausea and vomiting)   . Pre-diabetes   . Retina disorder    left  . Stroke [facility])    ? TIA per Dr V per patient   . Thyroid disease   . Weight gain     Current medications:  Outpatient Encounter Medications as of 03/13/2018  Medication Sig  . albuterol (PROVENTIL HFA;VENTOLIN HFA) 108 (90 BASE) MCG/ACT inhaler Inhale 1-2 puffs into the lungs every 6 (six) hours as needed for wheezing or shortness of breath.  . Ascorbic Acid (VITAMIN C) 1000 MG tablet Take 1,000 mg by mouth daily.  Marland Kitchen aspirin 325 MG EC tablet Take 325 mg 2 (two) times daily by mouth.  Marland Kitchen atorvastatin (LIPITOR) 10 MG tablet Take 10 mg daily by mouth.   . Biotin 1000 MCG tablet Take 1,000 mcg by mouth daily.   . Calcium 600-200 MG-UNIT tablet Take 1 tablet daily by mouth.  Marland Kitchen  Cholecalciferol (VITAMIN D3) 2000 UNITS capsule Take 2,000 Units by mouth daily.  Marland Kitchen dextromethorphan-guaiFENesin (MUCINEX DM) 30-600 MG 12hr tablet Take 1 tablet 2 (two) times daily by mouth.   . docusate sodium (COLACE) 100 MG capsule Take 1 capsule (100 mg total) by mouth 2 (two) times daily.  . ferrous sulfate (FERROUSUL) 325 (65 FE) MG tablet Take 1 tablet (325 mg total) by mouth 3 (three) times daily with meals.  . folic acid (FOLVITE) 660 MCG tablet Take 800 mcg by mouth daily.  Marland Kitchen HYDROcodone-acetaminophen (NORCO) 7.5-325 MG tablet Take 1-2 tablets by mouth every 4 (four) hours as needed for moderate pain or severe pain.  Marland Kitchen levothyroxine (SYNTHROID) 50 MCG tablet Take 1 tablet (50 mcg total) by mouth daily before breakfast.  . methocarbamol (ROBAXIN) 500 MG tablet Take 1 tablet (500 mg total) by mouth every 6 (six) hours as needed for muscle spasms.  . Multiple Vitamins-Minerals (PRESERVISION AREDS 2 PO) Take 1 capsule by mouth daily.  . Omega-3 Fatty Acids (FISH OIL) 1000 MG CAPS Take 1,000 mg by mouth daily.   Marland Kitchen omeprazole (PRILOSEC)  20 MG capsule Take 1 capsule (20 mg total) by mouth daily.  . polyethylene glycol (MIRALAX / GLYCOLAX) packet Take 17 g by mouth 2 (two) times daily.  Marland Kitchen senna (SENOKOT) 8.6 MG TABS tablet Take 2 tablets 2 (two) times daily by mouth.  . sertraline (ZOLOFT) 25 MG tablet Take 25 mg daily by mouth.   . vitamin B-12 (CYANOCOBALAMIN) 1000 MCG tablet Take 2,000 mcg daily by mouth. 2 tabs   No facility-administered encounter medications on file as of 03/13/2018.      Current Examination:  Behavioral Observations:  Appearance: Neatly and appropriately dressed and groomed, appearing younger than her stated age Gait: Ambulated independently, mild unsteadiness/ataxia observed Speech: Fluent; normal rate, rhythm and volume. Mild to moderate word finding difficulty. Thought process: Linear, goal directed Affect: Full, mildly anxious Interpersonal: Very pleasant, appropriate Orientation: Oriented to all spheres. Accurately named the current President and his predecessor.   Tests Administered: . Test of Premorbid Functioning (TOPF) . Wechsler Adult Intelligence Scale-Fourth Edition (WAIS-IV): Similarities, Music therapist, Coding and Digit Span subtests . Wechsler Memory Scale-Fourth Edition (WMS-IV) Older Adult Version (ages 53-90): Logical Memory I, II and Recognition subtests  . [employer] Verbal Learning Test - 2nd Edition (CVLT-2) Short Form . Repeatable Battery for the Assessment of Neuropsychological Status (RBANS) Form A:  Figure Copy and Recall subtests and Semantic Fluency subtest . Boston Naming Test (BNT) . Boston Diagnostic Aphasia Examination: Complex Ideational Material subtest . Controlled Oral Word Association Test (COWAT) . Trail Making Test A and B . Clock drawing test . Beck Depression Inventory - 2nd edition (BDI-II) . Generalized Anxiety Disorder - 7 item screener (GAD-7)  Test Results: Note: Standardized scores are presented only for use by appropriately trained professionals  and to allow for any future test-retest comparison. These scores should not be interpreted without consideration of all the information that is contained in the rest of the report. The most recent standardization samples from the test publisher or other sources were used whenever possible to derive standard scores; scores were corrected for age, gender, ethnicity and education when available.   Test Scores:  Test Name Raw Score Standardized Score Descriptor  TOPF 48/70 SS= 106 Average  WAIS-IV Subtests     Similarities 21/36 ss= 9 Average  Block Design 32/66 ss= 11 Average  Coding 54/135 ss= 12 High average  Digit Span Forward 9/16 ss= 9  Average  Digit Span Backward 8/16 ss= 10 Average  WMS-IV Subtests     LM I 43/53 ss= 16 Very superior  LM II 31/39 ss= 16 Very superior  LM II Recognition 23/23 Cum %: >75 Above average  RBANS Subtests     Figure Copy 16/20 Z= -1 Low average  Figure Recall 15/20 Z= 0.6 Average  Semantic Fluency 24 Z= 0.8 High average  CVLT-II Scores     Trial 1 4/9 Z= -1 Low average  Trial 4 9/9 Z= 1 High average  Trials 1-4 total 29/36 T= 57 High average  SD Free Recall 9/9 Z= 2 Very superior  LD Free Recall 9/9 Z= 1 High average  LD Cued Recall 9/9 Z= 1 High average  Recognition Discriminability 9/9 hits 0  false positives Z= 0.5 Average  Forced Choice Recognition 9/9  WNL  BNT 51/60 T= 50 Average  BDAE Subtest     Complex Ideational Material 10/12  WNL  COWAT-FAS 24 T= 35 Borderline  COWAT-Animals 20 T= 54 Average  Trail Making Test A  34" 0 errors T= 57 High average  Trail Making Test B  126" 0 errors T= 49 Average  Clock Drawing   WNL  BDI-II 26/63  Moderate  GAD-7 12/21  Moderate      Description of Test Results:  Premorbid verbal intellectual abilities were estimated to have been within the average range based on a test of word reading. Psychomotor processing speed was high average. Auditory attention and working memory were average.  Visual-spatial construction was low average to average. Language abilities were within normal limits. Specifically, confrontation naming was average, and semantic verbal fluency was average to high average. Auditory comprehension of complex ideational material was within normal limits. With regard to verbal memory, encoding and acquisition of non-contextual information (i.e., word list) was high average. After a brief distracter task, free recall was very superior (9/9 items). After a delay, free recall was high average with 100% retention (9/9 items). Performance on a yes/no recognition task was average with 100% accuracy. On another verbal memory test, encoding and acquisition of contextual auditory information (i.e., short stories) was very superior. After a delay, free recall was very superior. Performance on a yes/no recognition task was above average with 100% accuracy. With regard to non-verbal memory, delayed free recall of visual information was average. Executive functioning was mostly within normal limits. Mental flexibility and set-shifting were average on Trails B. Verbal fluency with phonemic search restrictions was borderline. Verbal abstract reasoning was average. Performance on a clock drawing task was normal. On a self-report measure of mood, the patient's responses were indicative of clinically significant depression at the present time. Symptoms endorsed included: sadness much of the time, anhedonia, pessimism, feelings of failure, guilty feelings, punishment feelings, disappointment in self, self-criticalness, inability to cry despite feeling like crying, restlessness, loss of interest, indecisiveness, worthlessness, loss of energy, reduced sleep, concentration difficulty, fatigue. She endorsed passive suicidal ideation but denied intention or plan. On a self-report measure of anxiety, the patient endorsed clinically significant generalized anxiety characterized by excessive worry, nervousness,  inability to control worry, difficulty relaxing, restlessness, irritability and fear of something awful happening.  Clinical Impressions: Major depressive disorder, moderate. Rule out generalized anxiety disorder. Results of cognitive testing were entirely within normal limits for age with no indication of any cognitive impairment. In fact, memory function was an area of relative strength, falling in the high average to very superior range. There is no evidence of neurocognitive  disorder or dementia and no indication of developing Alzheimer's disease. The patient does have chronic depression and anxiety, and I suspect these are contributing to her cognitive complaints in daily life.     Recommendations/Plan: Based on the findings of the present evaluation, the following recommendations are offered:  1. Could consider increasing Zoloft as she is on a low dose. She will discuss with her PCP. 2. Could consider psychotherapy for depression/anxiety. She is unsure if she wants to do this. 3. She was advised of the risk of confusion and falls with anticholinergic medications like Aleve PM, and she has stopped taking these. 4. She was reassured test results showed strong cognitive function and no sign of dementia/AD. 5. We discussed the need for increased activity and socialization to enhance mood, and she is going to try to make positive changes. 6. If there is any decline in cognitive functioning reported or observed in the future, these test results will serve as a nice baseline for comparison.   Feedback to Patient: [name] returned for a feedback appointment on 03/13/2018 to review the results of her neuropsychological evaluation with this provider. 25 minutes face-to-face time was spent reviewing her test results, my impressions and my recommendations as detailed above.    Total time spent on this patient's case: 100 minutes for neurobehavioral status exam with psychologist (CPT code [phone],  [phone] unit); 90 minutes of testing/scoring by psychometrician under psychologist's supervision (CPT codes [phone], [phone] units); 180 minutes for integration of patient data, interpretation of standardized test results and clinical data, clinical decision making, treatment planning and preparation of this report, and interactive feedback with review of results to the patient/family by psychologist (CPT codes [phone], [phone] units).      Thank you for your referral of [name]. Please feel free to contact me if you have any questions or concerns regarding this report.

## 2018-03-13 ENCOUNTER — Ambulatory Visit (INDEPENDENT_AMBULATORY_CARE_PROVIDER_SITE_OTHER): Payer: PPO | Admitting: Psychology

## 2018-03-13 ENCOUNTER — Encounter: Payer: Self-pay | Admitting: Psychology

## 2018-03-13 DIAGNOSIS — F32A Depression, unspecified: Secondary | ICD-10-CM

## 2018-03-13 DIAGNOSIS — R413 Other amnesia: Secondary | ICD-10-CM

## 2018-03-13 DIAGNOSIS — F329 Major depressive disorder, single episode, unspecified: Secondary | ICD-10-CM

## 2018-03-13 NOTE — Patient Instructions (Signed)
Fortunately, results of cognitive testing were entirely within normal limits and in fact well above the average range for your age. There is NO SIGN of Alzheimer's disease or dementia at this point.   It is most likely the case that your cognitive symptoms in daily life are due to depression.   Below is some more information on this.  I am hopeful that more aggressive treatment of depression and anxiety (e.g. Psychotherapy, possibly increasing Zoloft dosage) will not only improve your mood and coping, but also result in improved cognitive functioning in your daily life.    The effect of depression and anxiety on your cognitive functioning: . One of the typical symptoms of depression is difficulty concentrating and making decisions, and various types of anxiety also interfere with attention and concentration . Problems with attention and concentration can disrupt the process of learning and making new memories, which can make it seem like there is a problem with your memory. In your daily life, you may experience this disruption as forgetting names and appointments, misplacing items, and needing to make lists for shopping and errands. It may be harder for you to stay focused on tasks and feel as "sharp" as you did in the past.  . Also, when we are depressed or anxious, we often pay more attention to our difficulties (rather than our strengths) in our daily life, and this can make it seem to [name] like we are doing worse cognitively than we really are. . The cognitive aspects of depression and anxiety are sometimes observed as an identifiable pattern of poor performance on a neuropsychological evaluation, but it is also possible that all scores on an evaluation are within normal limits. . Regardless of the test scores, distress related to depression and anxiety can interfere with the ability to make use of your cognitive resources and function optimally across settings such as work or school, maintaining  the home and responsibilities, and personal relationships. . Fortunately, there are treatments for depression and anxiety, and when mood improves, cognitive functioning in daily life often improves. . Treatment options include psychotherapy, medications (e.g., antidepressants), and behavioral changes, such as increasing your involvement in enjoyable activities, increasing the amount of exercise you are getting, and maintaining a regular routine.

## 2018-03-21 DIAGNOSIS — D485 Neoplasm of uncertain behavior of skin: Secondary | ICD-10-CM | POA: Diagnosis not present

## 2018-03-21 DIAGNOSIS — C44729 Squamous cell carcinoma of skin of left lower limb, including hip: Secondary | ICD-10-CM | POA: Diagnosis not present

## 2018-04-17 DIAGNOSIS — C44722 Squamous cell carcinoma of skin of right lower limb, including hip: Secondary | ICD-10-CM | POA: Diagnosis not present

## 2018-04-17 DIAGNOSIS — L089 Local infection of the skin and subcutaneous tissue, unspecified: Secondary | ICD-10-CM | POA: Diagnosis not present

## 2018-04-17 DIAGNOSIS — Z23 Encounter for immunization: Secondary | ICD-10-CM | POA: Diagnosis not present

## 2018-04-17 DIAGNOSIS — D485 Neoplasm of uncertain behavior of skin: Secondary | ICD-10-CM | POA: Diagnosis not present

## 2018-04-17 DIAGNOSIS — Z5189 Encounter for other specified aftercare: Secondary | ICD-10-CM | POA: Diagnosis not present

## 2018-04-23 DIAGNOSIS — I2584 Coronary atherosclerosis due to calcified coronary lesion: Secondary | ICD-10-CM | POA: Diagnosis not present

## 2018-04-23 DIAGNOSIS — J410 Simple chronic bronchitis: Secondary | ICD-10-CM | POA: Diagnosis not present

## 2018-04-23 DIAGNOSIS — K219 Gastro-esophageal reflux disease without esophagitis: Secondary | ICD-10-CM | POA: Diagnosis not present

## 2018-04-23 DIAGNOSIS — G609 Hereditary and idiopathic neuropathy, unspecified: Secondary | ICD-10-CM | POA: Diagnosis not present

## 2018-04-23 DIAGNOSIS — R3915 Urgency of urination: Secondary | ICD-10-CM | POA: Diagnosis not present

## 2018-04-23 DIAGNOSIS — R7303 Prediabetes: Secondary | ICD-10-CM | POA: Diagnosis not present

## 2018-04-23 DIAGNOSIS — E785 Hyperlipidemia, unspecified: Secondary | ICD-10-CM | POA: Diagnosis not present

## 2018-04-23 DIAGNOSIS — E039 Hypothyroidism, unspecified: Secondary | ICD-10-CM | POA: Diagnosis not present

## 2018-04-23 DIAGNOSIS — E559 Vitamin D deficiency, unspecified: Secondary | ICD-10-CM | POA: Diagnosis not present

## 2018-04-23 DIAGNOSIS — Z0000 Encounter for general adult medical examination without abnormal findings: Secondary | ICD-10-CM | POA: Diagnosis not present

## 2018-04-23 DIAGNOSIS — F320 Major depressive disorder, single episode, mild: Secondary | ICD-10-CM | POA: Diagnosis not present

## 2018-04-23 DIAGNOSIS — F411 Generalized anxiety disorder: Secondary | ICD-10-CM | POA: Diagnosis not present

## 2018-05-01 DIAGNOSIS — C44729 Squamous cell carcinoma of skin of left lower limb, including hip: Secondary | ICD-10-CM | POA: Diagnosis not present

## 2018-06-08 DIAGNOSIS — L905 Scar conditions and fibrosis of skin: Secondary | ICD-10-CM | POA: Diagnosis not present

## 2018-06-08 DIAGNOSIS — C44722 Squamous cell carcinoma of skin of right lower limb, including hip: Secondary | ICD-10-CM | POA: Diagnosis not present

## 2018-06-19 ENCOUNTER — Other Ambulatory Visit: Payer: Self-pay | Admitting: Dermatology

## 2018-06-19 DIAGNOSIS — L570 Actinic keratosis: Secondary | ICD-10-CM | POA: Diagnosis not present

## 2018-06-19 DIAGNOSIS — Z85820 Personal history of malignant melanoma of skin: Secondary | ICD-10-CM

## 2018-06-19 DIAGNOSIS — L821 Other seborrheic keratosis: Secondary | ICD-10-CM | POA: Diagnosis not present

## 2018-06-19 DIAGNOSIS — R160 Hepatomegaly, not elsewhere classified: Secondary | ICD-10-CM | POA: Diagnosis not present

## 2018-06-19 DIAGNOSIS — Z23 Encounter for immunization: Secondary | ICD-10-CM | POA: Diagnosis not present

## 2018-06-19 DIAGNOSIS — Z808 Family history of malignant neoplasm of other organs or systems: Secondary | ICD-10-CM | POA: Diagnosis not present

## 2018-06-19 DIAGNOSIS — R1011 Right upper quadrant pain: Secondary | ICD-10-CM

## 2018-06-19 DIAGNOSIS — Z85828 Personal history of other malignant neoplasm of skin: Secondary | ICD-10-CM | POA: Diagnosis not present

## 2018-06-19 DIAGNOSIS — Z87898 Personal history of other specified conditions: Secondary | ICD-10-CM | POA: Diagnosis not present

## 2018-06-26 ENCOUNTER — Ambulatory Visit
Admission: RE | Admit: 2018-06-26 | Discharge: 2018-06-26 | Disposition: A | Discharge status: Home / Self Care | Payer: PPO | Source: Ambulatory Visit | Attending: Dermatology | Admitting: Dermatology

## 2018-06-26 DIAGNOSIS — Z85820 Personal history of malignant melanoma of skin: Secondary | ICD-10-CM

## 2018-06-26 DIAGNOSIS — R1011 Right upper quadrant pain: Secondary | ICD-10-CM

## 2018-06-26 DIAGNOSIS — K7689 Other specified diseases of liver: Secondary | ICD-10-CM | POA: Diagnosis not present

## 2018-06-26 DIAGNOSIS — R160 Hepatomegaly, not elsewhere classified: Secondary | ICD-10-CM

## 2018-07-09 DIAGNOSIS — R3915 Urgency of urination: Secondary | ICD-10-CM | POA: Diagnosis not present

## 2018-07-16 DIAGNOSIS — J019 Acute sinusitis, unspecified: Secondary | ICD-10-CM | POA: Diagnosis not present

## 2018-07-16 DIAGNOSIS — B9689 Other specified bacterial agents as the cause of diseases classified elsewhere: Secondary | ICD-10-CM | POA: Diagnosis not present

## 2018-07-16 DIAGNOSIS — J209 Acute bronchitis, unspecified: Secondary | ICD-10-CM | POA: Diagnosis not present

## 2018-07-26 DIAGNOSIS — R05 Cough: Secondary | ICD-10-CM | POA: Diagnosis not present

## 2018-08-06 ENCOUNTER — Encounter: Payer: Self-pay | Admitting: Podiatry

## 2018-08-06 ENCOUNTER — Ambulatory Visit: Payer: PPO | Admitting: Podiatry

## 2018-08-06 DIAGNOSIS — M778 Other enthesopathies, not elsewhere classified: Secondary | ICD-10-CM

## 2018-08-06 DIAGNOSIS — M7751 Other enthesopathy of right foot: Secondary | ICD-10-CM

## 2018-08-06 DIAGNOSIS — M779 Enthesopathy, unspecified: Secondary | ICD-10-CM

## 2018-08-06 DIAGNOSIS — Q828 Other specified congenital malformations of skin: Secondary | ICD-10-CM

## 2018-08-06 NOTE — Progress Notes (Signed)
[name] presents today chief complaint of painful lesion sub-fifth metatarsal phalangeal joint area of the right foot.  Objective: Vital signs are stable alert and oriented x3.  Pulses are palpable.  Reactive hyper keratoma sub-fifth metatarsal phalangeal joint with overlying bursitis.  Assessment: Bursitis poor keratoma.  Plan: Injected the bursitis today with 2 mg of dexamethasone and local anesthetic after sterile Betadine skin prep.  Also went ahead and performed a deep nucleation of the lesion today.  Tolerated procedure well without complications follow-up with her as needed.

## 2018-08-09 DIAGNOSIS — J069 Acute upper respiratory infection, unspecified: Secondary | ICD-10-CM | POA: Diagnosis not present

## 2018-08-09 DIAGNOSIS — B370 Candidal stomatitis: Secondary | ICD-10-CM | POA: Diagnosis not present

## 2018-08-15 DIAGNOSIS — R509 Fever, unspecified: Secondary | ICD-10-CM | POA: Diagnosis not present

## 2018-08-15 DIAGNOSIS — M546 Pain in thoracic spine: Secondary | ICD-10-CM | POA: Diagnosis not present

## 2018-08-15 DIAGNOSIS — R05 Cough: Secondary | ICD-10-CM | POA: Diagnosis not present

## 2018-08-15 DIAGNOSIS — J019 Acute sinusitis, unspecified: Secondary | ICD-10-CM | POA: Diagnosis not present

## 2018-08-15 DIAGNOSIS — B9689 Other specified bacterial agents as the cause of diseases classified elsewhere: Secondary | ICD-10-CM | POA: Diagnosis not present

## 2018-08-15 DIAGNOSIS — R7309 Other abnormal glucose: Secondary | ICD-10-CM | POA: Diagnosis not present

## 2018-08-15 DIAGNOSIS — J209 Acute bronchitis, unspecified: Secondary | ICD-10-CM | POA: Diagnosis not present

## 2018-08-15 DIAGNOSIS — J9801 Acute bronchospasm: Secondary | ICD-10-CM | POA: Diagnosis not present

## 2018-09-03 ENCOUNTER — Encounter: Payer: Self-pay | Admitting: Physician Assistant

## 2018-09-03 ENCOUNTER — Ambulatory Visit (INDEPENDENT_AMBULATORY_CARE_PROVIDER_SITE_OTHER): Payer: PPO | Admitting: Physician Assistant

## 2018-09-03 VITALS — BP 130/70 | HR 64 | Ht 64.57 in | Wt 184.0 lb

## 2018-09-03 DIAGNOSIS — B3781 Candidal esophagitis: Secondary | ICD-10-CM

## 2018-09-03 DIAGNOSIS — K219 Gastro-esophageal reflux disease without esophagitis: Secondary | ICD-10-CM | POA: Diagnosis not present

## 2018-09-03 DIAGNOSIS — R1310 Dysphagia, unspecified: Secondary | ICD-10-CM

## 2018-09-03 MED ORDER — OMEPRAZOLE 20 MG PO CPDR: 20.0000 mg | DELAYED_RELEASE_CAPSULE | Freq: Every day | ORAL | 0 refills | Status: DC

## 2018-09-03 MED ORDER — NYSTATIN 100000 UNIT/ML MT SUSP: 5.0000 mL | Freq: Four times a day (QID) | OROMUCOSAL | 0 refills | Status: DC

## 2018-09-03 MED ORDER — FLUCONAZOLE 100 MG PO TABS: ORAL_TABLET | ORAL | 0 refills | Status: DC

## 2018-09-03 NOTE — Progress Notes (Signed)
Subjective:    Patient ID: [name], female    DOB: 1940/09/15, 78 y.o.   MRN: [id number]  HPI [name] is a pleasant 78 year old white female, established with Dr. Havery Moros who was last seen in 2018 when she underwent upper endoscopy with finding of a 3 cm hiatal hernia and multiple plaques in the proximal and mid esophagus which were proven to be Candida esophagitis. She comes in today stating that she has been having heartburn, indigestion, dysphagia and some odynophagia over the past several weeks.  He states that all of her food tasted bad "like bile" and that her tongue has been irritated and at times has had a "strawberry" appearance. Is been maintained on omeprazole 20 mg p.o. every morning. Unfortunately she has been ill since mid December initially with upper respiratory infection, complicated by bronchitis and sinusitis.  She has now had 3 courses of antibiotics, the last which was Levaquin she did not finish due to rash.  She had also taken 2 courses of steroids. She was treated by an [facility] primary care physician with a very short course of Diflucan for oral thrush a couple of weeks ago.  She was given 1 tablet, and then told to take another tablet in a week if symptoms came back.  This briefly helped the symptoms but did not resolve them and they are now back with a vengeance. Scribes a spasm like  tight sensation in the lower chest/esophagus, intermittent sore throat and ongoing heartburn and indigestion.  She has had a couple of episodes of vomiting.  She denies any abdominal pain. Fortunately she feels that all of her respiratory and sinusitis symptoms have finally for the most part resolved.  Review of Systems Pertinent positive and negative review of systems were noted in the above HPI section.  All other review of systems was otherwise negative.  Outpatient Encounter Medications as of 09/03/2018  Medication Sig  . albuterol (PROVENTIL HFA;VENTOLIN HFA) 108 (90 BASE) MCG/ACT  inhaler Inhale 1-2 puffs into the lungs every 6 (six) hours as needed for wheezing or shortness of breath.  . Ascorbic Acid (VITAMIN C) 1000 MG tablet Take 1,000 mg by mouth daily.  Marland Kitchen aspirin 325 MG EC tablet Take 325 mg 2 (two) times daily by mouth.  Marland Kitchen atorvastatin (LIPITOR) 10 MG tablet Take 10 mg daily by mouth.   . Biotin 1000 MCG tablet Take 1,000 mcg by mouth daily.   . Calcium 600-200 MG-UNIT tablet Take 1 tablet daily by mouth.  . Cholecalciferol (VITAMIN D3) 2000 UNITS capsule Take 2,000 Units by mouth daily.  Marland Kitchen dextromethorphan-guaiFENesin (MUCINEX DM) 30-600 MG 12hr tablet Take 1 tablet by mouth as needed.   . folic acid (FOLVITE) 630 MCG tablet Take 800 mcg by mouth daily.  Marland Kitchen levothyroxine (SYNTHROID) 50 MCG tablet Take 1 tablet (50 mcg total) by mouth daily before breakfast.  . methocarbamol (ROBAXIN) 500 MG tablet Take 1 tablet (500 mg total) by mouth every 6 (six) hours as needed for muscle spasms.  . Multiple Vitamins-Minerals (PRESERVISION AREDS 2 PO) Take 1 capsule by mouth daily.  . Omega-3 Fatty Acids (FISH OIL) 1000 MG CAPS Take 1,000 mg by mouth daily.   Marland Kitchen omeprazole (PRILOSEC) 20 MG capsule Take 1 capsule (20 mg total) by mouth daily. Take twice daily for three weeks, then back to once daily.  Marland Kitchen oxybutynin (DITROPAN) 5 MG tablet Take 1 tablet by mouth 2 (two) times daily.  . vitamin B-12 (CYANOCOBALAMIN) 1000 MCG tablet Take 2,000  mcg daily by mouth. 2 tabs  . [DISCONTINUED] omeprazole (PRILOSEC) 20 MG capsule Take 1 capsule (20 mg total) by mouth daily.  . fluconazole (DIFLUCAN) 100 MG tablet Take two today then take one daily for 13 days.  Marland Kitchen nystatin (MYCOSTATIN) 100000 UNIT/ML suspension Take 5 mLs (500,000 Units total) by mouth 4 (four) times daily. Take 5 cc swish and swallow four times daily for 10 days.  . [DISCONTINUED] docusate sodium (COLACE) 100 MG capsule Take 1 capsule (100 mg total) by mouth 2 (two) times daily.  . [DISCONTINUED] ferrous sulfate (FERROUSUL)  325 (65 FE) MG tablet Take 1 tablet (325 mg total) by mouth 3 (three) times daily with meals.  . [DISCONTINUED] HYDROcodone-acetaminophen (NORCO) 7.5-325 MG tablet Take 1-2 tablets by mouth every 4 (four) hours as needed for moderate pain or severe pain.  . [DISCONTINUED] polyethylene glycol (MIRALAX / GLYCOLAX) packet Take 17 g by mouth 2 (two) times daily.  . [DISCONTINUED] senna (SENOKOT) 8.6 MG TABS tablet Take 2 tablets 2 (two) times daily by mouth.  . [DISCONTINUED] sertraline (ZOLOFT) 25 MG tablet Take 25 mg daily by mouth.    No facility-administered encounter medications on file as of 09/03/2018.    Allergies  Allergen Reactions  . Epinephrine Other (See Comments) and Anaphylaxis    Heart palpitations  . Penicillins Swelling and Other (See Comments)    Lip swelling Has patient had a PCN reaction causing immediate rash, facial/tongue/throat swelling, SOB or lightheadedness with hypotension: Yes Has patient had a PCN reaction causing severe rash involving mucus membranes or skin necrosis: No Has patient had a PCN reaction that required hospitalization No Has patient had a PCN reaction occurring within the last 10 years: No If all of the above answers are "NO", then may proceed with Cephalosporin use.  . Lidocaine Hcl Other (See Comments)  . Other     Black pepper- caused bronchial tubes to start closing   . Valium [Diazepam] Other (See Comments)    Other reaction(s): Other (See Comments) Overly sensitive  sedation   Patient Active Problem List   Diagnosis Date Noted  . S/P left TKA 05/16/2017  . S/P total knee replacement 05/16/2017  . Female stress incontinence 05/08/2017  . Pain in pelvis 05/08/2017  . Coronary artery calcification seen on CAT scan 04/09/2017  . Carotid stenosis, asymptomatic, bilateral 04/09/2017  . Mixed hyperlipidemia 04/09/2017  . MCI (mild cognitive impairment) 12/17/2015  . Urinary frequency 08/09/2015  . Hypothyroidism 08/09/2015  . Syncope  08/08/2015  . Posterior vitreous detachment of right eye 07/23/2015  . Hereditary and idiopathic peripheral neuropathy 05/11/2015  . Acute pancreatitis 12/21/2014  . Gallstones 12/21/2014  . Abdominal pain 12/21/2014  . Acute bronchitis due to infection 07/04/2014  . Epiretinal membrane, left 04/12/2012  . Nuclear cataract 04/12/2012  . Left lower quadrant pain 08/16/2011  . Breast pain in female 07/18/2011  . Melanoma of back ([place]) 02/28/2011   Social History   Socioeconomic History  . Marital status: Divorced    Spouse name: Not on file  . Number of children: 3  . Years of education: College  . Highest education level: Some college, no degree  Occupational History  . Occupation: Retired    Fish farm manager: RETIRED  Social Needs  . Financial resource strain: Not on file  . Food insecurity:    Worry: Not on file    Inability: Not on file  . Transportation needs:    Medical: Not on file    Non-medical: Not on  file  Tobacco Use  . Smoking status: Never Smoker  . Smokeless tobacco: Never Used  Substance and Sexual Activity  . Alcohol use: Yes    Alcohol/week: 1.0 standard drinks    Types: 1 Cans of beer per week    Comment: once a week beer or wine  . Drug use: No  . Sexual activity: Not Currently  Lifestyle  . Physical activity:    Days per week: Not on file    Minutes per session: Not on file  . Stress: Not on file  Relationships  . Social connections:    Talks on phone: Not on file    Gets together: Not on file    Attends religious service: Not on file    Active member of club or organization: Not on file    Attends meetings of clubs or organizations: Not on file    Relationship status: Not on file  . Intimate partner violence:    Fear of current or ex partner: Not on file    Emotionally abused: Not on file    Physically abused: Not on file    Forced sexual activity: Not on file  Other Topics Concern  . Not on file  Social History Narrative   ** Merged History  Encounter **       Patient lives at home alone in a one story home.   Has 3 children.   Retired from [employer]   Caffeine Use: 1 cup daily    Ms. Storbeck's family history includes Alzheimer's disease in her paternal aunt, paternal aunt, and paternal aunt; Colon cancer in her maternal grandfather; Colon polyps in her sister; Diabetes in her father and sister; Heart disease in her brother, father, and sister; Hyperlipidemia in her father; Hypertension in her father and mother; Pancreatic cancer in her maternal uncle; Stroke in her mother.      Objective:    Vitals:   09/03/18 0845  BP: 130/70  Pulse: 64    Physical Exam; well-developed elderly white female in no acute distress, very pleasant.  Height 5 foot 4, weight 184, BMI 31.0.  HEENT ;nontraumatic normocephalic EOMI PERRLA sclera anicteric, oral mucosa moist, tongue with tiny white plaques more posteriorly I do not see any definite thrush posterior pharynx.  Cardiovascular; regular rate and rhythm with S1-S2 no murmur rub or gallop, Pulmonary; clear bilaterally, Abdomen; soft, nontender nondistended bowel sounds are active there is no palpable mass or hepatosplenomegaly.  Rectal ;exam not done, Extremities ;no clubbing cyanosis or edema skin warm dry, Neuropsych; alert and oriented, grossly nonfocal mood and affect appropriate       Assessment & Plan:   #59 78 year old white female with history of chronic GERD and prior Candida esophagitis documented on EGD 2018, who presents with several week history of progressive heartburn indigestion, dysphagia/mild odynophagia, recent oral thrush and alteration of taste after several courses of antibiotics and steroids  I suspect she has acute esophagitis, likely candidal in etiology.  #2  S/p cholecystectomy #3.  Coronary artery disease #4.  Hypothyroid  Plan; Increase omeprazole to 20 mg p.o. twice daily x3 weeks Start Diflucan 200 mg p.o. on day 1 and then 100 mg daily x13  days. Mycostatin oral suspension 5 cc swish and swallow 4 times daily. Patient is asked to call back, and speak to my nurse, in a week if she has not had significant improvement in symptoms.  Amy S Esterwood PA-C 09/03/2018   Cc: Leeroy Cha,*

## 2018-09-03 NOTE — Patient Instructions (Addendum)
If you are age 78 or older, your body mass index should be between 23-30. Your Body mass index is 31.03 kg/m. If this is out of the aforementioned range listed, please consider follow up with your Primary Care Provider.  If you are age 73 or younger, your body mass index should be between 19-25. Your Body mass index is 31.03 kg/m. If this is out of the aformentioned range listed, please consider follow up with your Primary Care Provider.   We have sent the following medications to your pharmacy for you to pick up at your convenience: Omeprazole Diflucan Mycostatin  Call back in one week if not significantly better and speak with my nurse.  Thank you for choosing me and [facility].   Amy Esterwood, PA-C

## 2018-09-04 NOTE — Progress Notes (Signed)
Agree with assessment and plan as outlined.  

## 2018-09-13 ENCOUNTER — Encounter: Payer: Self-pay | Admitting: Internal Medicine

## 2018-09-13 ENCOUNTER — Telehealth: Payer: Self-pay | Admitting: Internal Medicine

## 2018-09-13 ENCOUNTER — Ambulatory Visit (INDEPENDENT_AMBULATORY_CARE_PROVIDER_SITE_OTHER): Payer: PPO | Admitting: Internal Medicine

## 2018-09-13 VITALS — BP 122/66 | HR 68 | Temp 98.0°F | Ht 64.57 in | Wt 186.6 lb

## 2018-09-13 DIAGNOSIS — K219 Gastro-esophageal reflux disease without esophagitis: Secondary | ICD-10-CM

## 2018-09-13 DIAGNOSIS — E038 Other specified hypothyroidism: Secondary | ICD-10-CM

## 2018-09-13 DIAGNOSIS — Z8673 Personal history of transient ischemic attack (TIA), and cerebral infarction without residual deficits: Secondary | ICD-10-CM | POA: Insufficient documentation

## 2018-09-13 DIAGNOSIS — Z87898 Personal history of other specified conditions: Secondary | ICD-10-CM | POA: Diagnosis not present

## 2018-09-13 DIAGNOSIS — E782 Mixed hyperlipidemia: Secondary | ICD-10-CM | POA: Diagnosis not present

## 2018-09-13 DIAGNOSIS — R059 Cough, unspecified: Secondary | ICD-10-CM

## 2018-09-13 DIAGNOSIS — Z85828 Personal history of other malignant neoplasm of skin: Secondary | ICD-10-CM | POA: Diagnosis not present

## 2018-09-13 DIAGNOSIS — Z1159 Encounter for screening for other viral diseases: Secondary | ICD-10-CM

## 2018-09-13 DIAGNOSIS — E039 Hypothyroidism, unspecified: Secondary | ICD-10-CM

## 2018-09-13 DIAGNOSIS — Z8659 Personal history of other mental and behavioral disorders: Secondary | ICD-10-CM | POA: Insufficient documentation

## 2018-09-13 DIAGNOSIS — E559 Vitamin D deficiency, unspecified: Secondary | ICD-10-CM

## 2018-09-13 DIAGNOSIS — R7303 Prediabetes: Secondary | ICD-10-CM | POA: Insufficient documentation

## 2018-09-13 DIAGNOSIS — Z13818 Encounter for screening for other digestive system disorders: Secondary | ICD-10-CM

## 2018-09-13 DIAGNOSIS — G609 Hereditary and idiopathic neuropathy, unspecified: Secondary | ICD-10-CM | POA: Diagnosis not present

## 2018-09-13 DIAGNOSIS — Z0000 Encounter for general adult medical examination without abnormal findings: Secondary | ICD-10-CM

## 2018-09-13 DIAGNOSIS — R05 Cough: Secondary | ICD-10-CM

## 2018-09-13 DIAGNOSIS — K760 Fatty (change of) liver, not elsewhere classified: Secondary | ICD-10-CM | POA: Insufficient documentation

## 2018-09-13 DIAGNOSIS — Z8669 Personal history of other diseases of the nervous system and sense organs: Secondary | ICD-10-CM | POA: Insufficient documentation

## 2018-09-13 DIAGNOSIS — K449 Diaphragmatic hernia without obstruction or gangrene: Secondary | ICD-10-CM | POA: Insufficient documentation

## 2018-09-13 DIAGNOSIS — E538 Deficiency of other specified B group vitamins: Secondary | ICD-10-CM

## 2018-09-13 MED ORDER — ALBUTEROL SULFATE HFA 108 (90 BASE) MCG/ACT IN AERS: 1.0000 | INHALATION_SPRAY | Freq: Four times a day (QID) | RESPIRATORY_TRACT | 11 refills | Status: DC | PRN

## 2018-09-13 NOTE — Telephone Encounter (Signed)
Pt did not have intake forms complete at the beginning nor end of visit and had this with her Where are they I need them?   [place]

## 2018-09-13 NOTE — Progress Notes (Addendum)
Chief Complaint  Patient presents with  . Establish Care   New patient former pt Dr. Jossie Ng GI need to get records   1. Hypothyroidism TSH 1.524 08/15/2018 on levo 50 mcg qd 2. H/o stroke note on prior imaging 11/11/14 right frontal infarct pt was unaware she had but associated it to death of her cousin around that time 3. History of depression off zoloft now  4. H/o fatty liver CMET normal 08/15/2018 noted on prior US  5. GERD h/o hiatal hernia on prilosec 20 mg bid  6. Chronic cough since 07/16/2018 tried Zpack, Doxycycline, levaquin no h/o allergies. She still c/o cough on PPI BID  -CXR had 08/15/18 unable to see records [facility]  7. HLD on lipitor 10 mg qhs  8. H/o migraines and memory loss and idiopathic neuropathy f/u [facility]   Review of Systems  Constitutional: Negative for weight loss.  HENT: Negative for hearing loss.   Eyes: Negative for blurred vision.  Respiratory: Positive for cough.   Cardiovascular: Negative for chest pain.  Gastrointestinal: Negative for abdominal pain.  Musculoskeletal: Negative for falls.  Skin: Negative for rash.  Neurological: Negative for headaches.  Psychiatric/Behavioral: Positive for memory loss. Negative for depression.   Past Medical History:  Diagnosis Date  . Acid reflux   . Allergy   . Anemia   . Anxiety    in past  . Arthritis    all over  . Atypical moles   . Bronchitis   . Bronchospasm    reactive with perfume/cologne and smoke  . CAD (coronary artery disease)    patient denies on preop of 10/24   . Candida esophagitis ([place])   . Carotid artery calcification, bilateral 04/2016  . Cataract   . Cervical stenosis of spine   . Chronic insomnia   . Complication of anesthesia    shoulder surgery- not all the way asleep  . COPD (chronic obstructive pulmonary disease) (HCC)    chronic bronchitis   . Depression    in past  . Diverticulitis   . Diverticulosis   . Dizziness   . Elevated liver enzymes   . Family history of  adverse reaction to anesthesia    sister- has problems with nausea and vomiting   . Fatigue   . Fatty liver   . Generalized headache    migraines  . GERD (gastroesophageal reflux disease)   . History of chemotherapy    topical cream for h/o skin CA  . History of kidney stones   . History of neuropathy    FH Dr. Posey Pronto neurology  . History of vertigo   . Hx of migraines   . Hyperlipidemia   . Hypothyroidism   . Insomnia   . Irritable bowel syndrome   . LLQ abdominal pain   . Melanoma ([place]) 2013  . Melanoma ([place])    x2 stg II melanoma  . Neuropathy, peripheral   . Peripheral neuropathy   . Pneumonia    hx of x 2  . PONV (postoperative nausea and vomiting)   . Pre-diabetes   . Retina disorder    left  . SCC (squamous cell carcinoma)    skin   . Stroke [facility])    ? TIA per Dr V per patient   . Thyroid disease   . Urine incontinence   . Vitamin D deficiency   . Weight gain    Past Surgical History:  Procedure Laterality Date  . BILATERAL SALPINGOOPHORECTOMY  ovarian cyst Dr. Ouida Sills and Dr. Alisia Ferrari 2008  . BREAST SURGERY     in 2014 bx   . BREAST SURGERY     augmentation with saline Dr. Hubbard Hartshorn 1995   . CHOLECYSTECTOMY N/A 01/28/2015   Procedure: LAPAROSCOPIC CHOLECYSTECTOMY WITH INTRAOPERATIVE CHOLANGIOGRAM;  Surgeon: Autumn Messing III, MD;  Location: [place];  Service: General;  Laterality: N/A;  . CLOSED MANIPULATION SHOULDER     rt  . COLONOSCOPY WITH PROPOFOL N/A 11/20/2012   Procedure: COLONOSCOPY WITH PROPOFOL;  Surgeon: Garlan Fair, MD;  Location: [facility];  Service: Endoscopy;  Laterality: N/A;  . colonscopy     May 2014  . JOINT REPLACEMENT     RTK  . KNEE ARTHROSCOPY    . left knee miniscus tear repair    . MELANOMA EXCISION WITH SENTINEL LYMPH NODE BIOPSY  03/18/11   Back, nodes neg  . OOPHORECTOMY    . ROTATOR CUFF REPAIR     left  . SHOULDER SURGERY     right and left   . TOTAL HIP ARTHROPLASTY    . TOTAL KNEE ARTHROPLASTY     right  2010 and left in 2018   . TOTAL KNEE ARTHROPLASTY Left 05/16/2017   Procedure: LEFT TOTAL KNEE ARTHROPLASTY;  Surgeon: Paralee Cancel, MD;  Location: [facility];  Service: Orthopedics;  Laterality: Left;  70 mins  . TUBAL LIGATION    . TUMOR REMOVAL     ovary   Family History  Problem Relation Age of Onset  . Hypertension Mother   . Stroke Mother   . Hyperlipidemia Mother   . Heart disease Father   . Diabetes Father   . Hypertension Father   . Hyperlipidemia Father   . Early death Father   . Diabetes Sister   . Heart disease Sister        heart murmur  . Arthritis Sister   . COPD Sister   . Hyperlipidemia Sister   . Hypertension Sister   . Pancreatic cancer Maternal Uncle   . Alzheimer's disease Paternal Aunt   . Colon cancer Maternal Grandfather   . Cancer Maternal Grandfather   . Early death Maternal Grandfather   . Alzheimer's disease Paternal Aunt   . Alzheimer's disease Paternal Aunt   . Colon polyps Sister        adenomous  . Arthritis Sister   . Heart disease Sister   . Hyperlipidemia Sister   . Hypertension Sister   . Cancer Sister   . Heart disease Daughter   . Hyperlipidemia Daughter   . Hypertension Daughter   . Heart disease Son   . Hyperlipidemia Son   . Alcohol abuse Son   . Depression Son   . Diabetes Son   . Stroke Maternal Grandmother   . Early death Paternal Grandmother   . Stroke Paternal Grandmother   . Cancer Other        2 paternal uncles and 1 maternal uncle colon cancer   Social History   Socioeconomic History  . Marital status: Divorced    Spouse name: Not on file  . Number of children: 3  . Years of education: College  . Highest education level: Some college, no degree  Occupational History  . Occupation: Retired    Fish farm manager: RETIRED  Social Needs  . Financial resource strain: Not on file  . Food insecurity:    Worry: Not on file    Inability: Not on file  . Transportation needs:  Medical: Not on file    Non-medical: Not on  file  Tobacco Use  . Smoking status: Never Smoker  . Smokeless tobacco: Never Used  Substance and Sexual Activity  . Alcohol use: Yes    Alcohol/week: 1.0 standard drinks    Types: 1 Cans of beer per week    Comment: once a week beer or wine  . Drug use: No  . Sexual activity: Not Currently  Lifestyle  . Physical activity:    Days per week: Not on file    Minutes per session: Not on file  . Stress: Not on file  Relationships  . Social connections:    Talks on phone: Not on file    Gets together: Not on file    Attends religious service: Not on file    Active member of club or organization: Not on file    Attends meetings of clubs or organizations: Not on file    Relationship status: Not on file  . Intimate partner violence:    Fear of current or ex partner: Not on file    Emotionally abused: Not on file    Physically abused: Not on file    Forced sexual activity: Not on file  Other Topics Concern  . Not on file  Social History Narrative   Patient lives at home alone in a one story home.   Has 3 children (2 daughters and 1 son)    Retired from [employer]   Divorced since 1992    Caffeine Use: 1 cup daily   No guns    Wears seat belt    Current Meds  Medication Sig  . albuterol (PROVENTIL HFA;VENTOLIN HFA) 108 (90 Base) MCG/ACT inhaler Inhale 1-2 puffs into the lungs every 6 (six) hours as needed for wheezing or shortness of breath.  . Apoaequorin (PREVAGEN) 10 MG CAPS Take by mouth.  . Ascorbic Acid (VITAMIN C) 1000 MG tablet Take 1,000 mg by mouth daily.  Marland Kitchen aspirin 81 MG tablet TAKE 1 TABLET BY MOUTH EVERY DAY  . Biotin 1000 MCG tablet Take 1,000 mcg by mouth daily.   . Calcium 600-200 MG-UNIT tablet Take 1 tablet daily by mouth.  . Cholecalciferol (VITAMIN D3) 2000 UNITS capsule Take 2,000 Units by mouth daily.  . Coenzyme Q10 (COQ10) 30 MG CAPS Take by mouth.  . dextromethorphan-guaiFENesin (MUCINEX DM) 30-600 MG 12hr tablet Take 1 tablet by mouth as  needed.   Marland Kitchen levothyroxine (SYNTHROID) 50 MCG tablet Take 1 tablet (50 mcg total) by mouth daily before breakfast.  . Multiple Vitamins-Minerals (PRESERVISION AREDS 2 PO) Take 1 capsule by mouth daily.  . Omega-3 Fatty Acids (FISH OIL) 1000 MG CAPS Take 1,000 mg by mouth daily.   Marland Kitchen omeprazole (PRILOSEC) 20 MG capsule Take 1 capsule (20 mg total) by mouth daily. Take twice daily for three weeks, then back to once daily. (Patient taking differently: Take 20 mg by mouth 2 (two) times daily before a meal. Take twice daily for three weeks, then back to once daily.)  . oxybutynin (DITROPAN) 5 MG tablet Take 1 tablet by mouth 2 (two) times daily.  . vitamin B-12 (CYANOCOBALAMIN) 1000 MCG tablet Take 2,000 mcg daily by mouth. 2 tabs  . [DISCONTINUED] albuterol (PROVENTIL HFA;VENTOLIN HFA) 108 (90 BASE) MCG/ACT inhaler Inhale 1-2 puffs into the lungs every 6 (six) hours as needed for wheezing or shortness of breath.  . [DISCONTINUED] methocarbamol (ROBAXIN) 500 MG tablet Take 1 tablet (500 mg total) by mouth  every 6 (six) hours as needed for muscle spasms.   Allergies  Allergen Reactions  . Epinephrine Other (See Comments) and Anaphylaxis    Heart palpitations  . Penicillins Swelling and Other (See Comments)    Lip swelling Has patient had a PCN reaction causing immediate rash, facial/tongue/throat swelling, SOB or lightheadedness with hypotension: Yes Has patient had a PCN reaction causing severe rash involving mucus membranes or skin necrosis: No Has patient had a PCN reaction that required hospitalization No Has patient had a PCN reaction occurring within the last 10 years: No If all of the above answers are "NO", then may proceed with Cephalosporin use.  . Lidocaine Hcl Other (See Comments)  . Other     Black pepper- caused bronchial tubes to start closing   . Valium [Diazepam] Other (See Comments)    Other reaction(s): Other (See Comments) Overly sensitive  sedation   No results found for  this or any previous visit (from the past 2160 hour(s)). Objective  Body mass index is 31.47 kg/m. Wt Readings from Last 3 Encounters:  09/13/18 186 lb 9.6 oz (84.6 kg)  09/03/18 184 lb (83.5 kg)  09/22/17 185 lb 2 oz (84 kg)   Temp Readings from Last 3 Encounters:  09/13/18 98 F (36.7 C) (Oral)  05/23/17 98.1 F (36.7 C) (Oral)  05/17/17 98 F (36.7 C) (Oral)   BP Readings from Last 3 Encounters:  09/13/18 122/66  09/03/18 130/70  09/22/17 130/80   Pulse Readings from Last 3 Encounters:  09/13/18 68  09/03/18 64  09/22/17 66    Physical Exam Vitals signs and nursing note reviewed.  Constitutional:      Appearance: Normal appearance. She is well-developed and well-groomed. She is obese.  HENT:     Head: Normocephalic and atraumatic.     Mouth/Throat:     Mouth: Mucous membranes are moist.     Pharynx: Oropharynx is clear.  Eyes:     Conjunctiva/sclera: Conjunctivae normal.     Pupils: Pupils are equal, round, and reactive to light.  Cardiovascular:     Rate and Rhythm: Normal rate and regular rhythm.     Heart sounds: Normal heart sounds.  Pulmonary:     Effort: Pulmonary effort is normal.     Breath sounds: Normal breath sounds.  Skin:    General: Skin is warm and dry.  Neurological:     General: No focal deficit present.     Mental Status: She is alert and oriented to person, place, and time. Mental status is at baseline.     Gait: Gait normal.  Psychiatric:        Attention and Perception: Attention and perception normal.        Mood and Affect: Mood and affect normal.        Speech: Speech normal.        Behavior: Behavior normal. Behavior is cooperative.        Thought Content: Thought content normal.        Cognition and Memory: Cognition and memory normal.        Judgment: Judgment normal.     Assessment   1. Hypothyroidism TSH 1.524 08/15/2018  2. H/o stroke note on prior imaging 11/11/14 right frontal infarct 3. History of depression off  zoloft now  4. H/o fatty liver  5. GERD h/o hiatal hernia 6. Chronic cough since 07/16/2018 tried Zpack, Doxycycline, levaquin no h/o allergies  -CXR had 08/15/18 unable to see records [facility]  7.  HLD  8. H/o migraines and memory loss and idiopathic neuropathy  9. HM Plan   1.  On levo 50 mcg qd  2.  F/u neurology [facility]  3.  [facility]  4.  Check Hep A/B/C  5. On prilosec 20 mg bid  6. Consider Chest CT cough is not getting better per pt  Get copy of CXR [facility]  Prn Albuterol inhaler  7.  sch fasting labs CMET, CBC, lipid, TSH, A1C, vitamin D, Hep A/B/C, B12  Not currently lipitor 10 mg qhs  8.  F/u [facility] Dr. Posey Pronto 9. Need to get accurate med list call pharmacy pt is unsure what she is taking today and paperwork completed after exam as she did not arrive in time to fill out paperwork and was not able to get it done before visit   sch fasting labs 11/2018 and f/u 11/2018 end or 12/2018   Flu shot had 04/12/18  Tdap -had TD 10/18/12 not pertussis will need this in future  shingrix will need  zostervax had 06/16/11  prevnar had 05/15/14  Pna 23 had 12/16/05, 05/18/16  -reviewed records from [place] and updated vaccines in my note   Pap out of age window LMP [place] get records due spring 2020 -will need with implants -10/18/16 screening with implants neg FH breast cancer  p cousins dx'ed in 49s x 70, sister  Had at age 63 y.o and colon cancer age 23, 2 paternal cousins ovarian cancer age 48 and 65   Colonoscopy 11/2012 tortuous colon no further rec. Consider cologuard in future if no h/o polyps DEXA -[facility] 06/28/16 normal reviewed report did not scan into chart -h/o vitamin D def on D3 2000 IU daily  Prediabetes A1C 08/15/18 6.1 04/2018 60  CBC normal 08/15/18, CMET Cr. 1.0 GFR 54 (slightly low), UA rare bacteria, TSH 1.524   Cards-Dr. Rockey Situ  [facility]  Eye MD Dr. Cordelia Pen [facility]-retinal specialist  Dentist Dr. Lynelle Smoke  Neurology-[facility]   ENT-Dr. Tami Ribas for vestibular balance issue s Podiatry Dr. Milinda Pointer  H/o D.r [facility]   Dermatology Dr. Delman Cheadle in [place] h/o [facility] left leg and MM right leg, neck and back with node dissection Dr. Marlou Starks 8/12   Former PCP Dr. Jossie Ng requested labs, all vaccines, DEXA  -pt brought in all records week of 09/21/2018 and reviewed  -h/o iron def anemia  -last seen 07/09/18 and 08/09/18 possible thrush and URI US carotid 05/13/16 <50% stenosis in right and left ICA -she was at this time put on lipitor 10 mg qhs  CT ab/pelvis 07/26/17 left base scarring lung, well circumcised liver lesion likely cysts and stable hepatomegaly 19.4 cm cranlocaudal pancreas/spleen normal, extensive colonic diverticulosis, aortic and branch vessell atherosclerosis no adenopathy, normal uterus and adnexa, advanced LS spondylosis grade 1 L4/5 and L5/S1 anterolisthesis are similar, pelvic floor laxity, hiatal hernia  CXR 02/26/18 thoracic aorta mildly atherosclerotic unchanged, Deg changes T spine, Deg changes b/l shoulders prior right shoulder surgery calcification distal left supraspinatus tendon at its insertion on the great tuberosity of the left humerus   UA normal 07/09/18  04/23/18 A1c 6.0  04/23/18 CBC 6.2/14.2/42.9/plts 199  04/23/18 CMET Cr 0.88 BUN 20 EGFR 62, total bili chronically elevated 1.1 (prior direct bili low 0.1 likely unconjugated high) AST15, ALT 14 normal  04/23/18 lipidTC 155, TG 143, HDL 68, LDL 58 10/17/17 vitamin D 41.1 04/18/16 33.7  TSH 06/29/17 2.06  FOBT neg  x 3 05/19/16     Former cards Dr. Yvone Neu    Of note her cousin is Laurence Compton  Provider: Dr. Olivia Mackie McLean-Scocuzza-Internal Medicine    Pre visit review using our clinic review tool, if applicable. No additional management support is needed unless otherwise documented below in the visit note.

## 2018-09-13 NOTE — Progress Notes (Signed)
No vaccines in [facility]   

## 2018-09-13 NOTE — Patient Instructions (Signed)
Copy of  Vaccines, bone density, labs from Dr. Clayton Bibles   Cough, Adult  Coughing is a reflex that clears your throat and your airways. Coughing helps to heal and protect your lungs. It is normal to cough occasionally, but a cough that happens with other symptoms or lasts a long time may be a sign of a condition that needs treatment. A cough may last only 2-3 weeks (acute), or it may last longer than 8 weeks (chronic). What are the causes? Coughing is commonly caused by:  Breathing in substances that irritate your lungs.  A viral or bacterial respiratory infection.  Allergies.  Asthma.  Postnasal drip.  Smoking.  Acid backing up from the stomach into the esophagus (gastroesophageal reflux).  Certain medicines.  Chronic lung problems, including COPD (or rarely, lung cancer).  Other medical conditions such as heart failure. Follow these instructions at home: Pay attention to any changes in your symptoms. Take these actions to help with your discomfort:  Take medicines only as told by your health care provider. ? If you were prescribed an antibiotic medicine, take it as told by your health care provider. Do not stop taking the antibiotic even if you start to feel better. ? Talk with your health care provider before you take a cough suppressant medicine.  Drink enough fluid to keep your urine clear or pale yellow.  If the air is dry, use a cold steam vaporizer or humidifier in your bedroom or your home to help loosen secretions.  Avoid anything that causes you to cough at work or at home.  If your cough is worse at night, try sleeping in a semi-upright position.  Avoid cigarette smoke. If you smoke, quit smoking. If you need help quitting, ask your health care provider.  Avoid caffeine.  Avoid alcohol.  Rest as needed. Contact a health care provider if:  You have new symptoms.  You cough up pus.  Your cough does not get better after 2-3 weeks, or your cough gets  worse.  You cannot control your cough with suppressant medicines and you are losing sleep.  You develop pain that is getting worse or pain that is not controlled with pain medicines.  You have a fever.  You have unexplained weight loss.  You have night sweats. Get help right away if:  You cough up blood.  You have difficulty breathing.  Your heartbeat is very fast. This information is not intended to replace advice given to you by your health care provider. Make sure you discuss any questions you have with your health care provider. Document Released: 12/31/2010 Document Revised: 12/10/2015 Document Reviewed: 09/10/2014 Elsevier Interactive Patient Education  2019 [employer].

## 2018-09-16 ENCOUNTER — Encounter: Payer: Self-pay | Admitting: Internal Medicine

## 2018-09-16 ENCOUNTER — Telehealth: Payer: Self-pay | Admitting: Internal Medicine

## 2018-09-16 NOTE — Telephone Encounter (Signed)
1. Call patient she needs to schedule f/u in 2-3 weeks BUT BEFORE   2. sch fasting lab appt nothing but water and medications, schedule this lab visit and office visit   3. [facility] and get medication list faxed to [name] over the last 1 year    Cash

## 2018-09-19 ENCOUNTER — Telehealth: Payer: Self-pay | Admitting: Internal Medicine

## 2018-09-19 NOTE — Telephone Encounter (Signed)
Pt dropped off medical records at [facility] today. Records were delivered to physician by Lorriane Shire on 09/19/18.

## 2018-09-20 NOTE — Telephone Encounter (Signed)
Noted.  [name]

## 2018-09-22 ENCOUNTER — Encounter: Payer: Self-pay | Admitting: Internal Medicine

## 2018-09-22 NOTE — Addendum Note (Signed)
Addended by: Orland Mustard on: 09/22/2018 11:47 PM   Modules accepted: Orders

## 2018-09-25 ENCOUNTER — Telehealth: Payer: Self-pay

## 2018-09-25 DIAGNOSIS — N3281 Overactive bladder: Secondary | ICD-10-CM | POA: Diagnosis not present

## 2018-09-25 NOTE — Telephone Encounter (Signed)
-----   Message from Delorise Jackson, MD sent at 09/22/2018 11:47 PM EST ----- Call pt  1. Records ready for pick up  2. Make fasting lab appt 8-12 hours nothing but water and meds 8-12 hrs here in mid to late may and f/u 1 week after labs or in 12/2018   Thanks tMS

## 2018-09-25 NOTE — Telephone Encounter (Signed)
Left message for patient to return call back. [facility] may give and obtain information.  

## 2018-09-28 ENCOUNTER — Other Ambulatory Visit: Payer: Self-pay | Admitting: Physician Assistant

## 2018-09-30 ENCOUNTER — Other Ambulatory Visit: Payer: Self-pay | Admitting: Physician Assistant

## 2018-10-02 ENCOUNTER — Emergency Department: Payer: PPO

## 2018-10-02 ENCOUNTER — Encounter: Payer: Self-pay | Admitting: Emergency Medicine

## 2018-10-02 ENCOUNTER — Other Ambulatory Visit: Payer: Self-pay

## 2018-10-02 ENCOUNTER — Emergency Department
Admission: EM | Admit: 2018-10-02 | Discharge: 2018-10-02 | Disposition: A | Discharge status: Home / Self Care | Payer: PPO | Attending: Emergency Medicine | Admitting: Emergency Medicine

## 2018-10-02 ENCOUNTER — Ambulatory Visit: Payer: Self-pay | Admitting: *Deleted

## 2018-10-02 DIAGNOSIS — R600 Localized edema: Secondary | ICD-10-CM | POA: Insufficient documentation

## 2018-10-02 DIAGNOSIS — E039 Hypothyroidism, unspecified: Secondary | ICD-10-CM | POA: Diagnosis not present

## 2018-10-02 DIAGNOSIS — Z96651 Presence of right artificial knee joint: Secondary | ICD-10-CM | POA: Diagnosis not present

## 2018-10-02 DIAGNOSIS — I2510 Atherosclerotic heart disease of native coronary artery without angina pectoris: Secondary | ICD-10-CM | POA: Insufficient documentation

## 2018-10-02 DIAGNOSIS — Z8673 Personal history of transient ischemic attack (TIA), and cerebral infarction without residual deficits: Secondary | ICD-10-CM | POA: Diagnosis not present

## 2018-10-02 DIAGNOSIS — Z79899 Other long term (current) drug therapy: Secondary | ICD-10-CM | POA: Insufficient documentation

## 2018-10-02 DIAGNOSIS — R079 Chest pain, unspecified: Secondary | ICD-10-CM | POA: Insufficient documentation

## 2018-10-02 DIAGNOSIS — J449 Chronic obstructive pulmonary disease, unspecified: Secondary | ICD-10-CM | POA: Insufficient documentation

## 2018-10-02 DIAGNOSIS — Z85820 Personal history of malignant melanoma of skin: Secondary | ICD-10-CM | POA: Insufficient documentation

## 2018-10-02 DIAGNOSIS — R0789 Other chest pain: Secondary | ICD-10-CM | POA: Diagnosis not present

## 2018-10-02 LAB — BASIC METABOLIC PANEL
Anion gap: 12 (ref 5–15)
BUN: 13 mg/dL (ref 8–23)
CO2: 25 mmol/L (ref 22–32)
Calcium: 9.2 mg/dL (ref 8.9–10.3)
Chloride: 105 mmol/L (ref 98–111)
Creatinine, Ser: 0.88 mg/dL (ref 0.44–1.00)
GFR calc Af Amer: >60 mL/min (ref >60)
GFR calc non Af Amer: >60 mL/min (ref >60)
Glucose, Bld: 99 mg/dL (ref 70–99)
Potassium: 3.5 mmol/L (ref 3.5–5.1)
Sodium: 142 mmol/L (ref 135–145)

## 2018-10-02 LAB — CBC
HCT: 40.8 % (ref 36.0–46.0)
Hemoglobin: 13.5 g/dL (ref 12.0–15.0)
MCH: 30.2 pg (ref 26.0–34.0)
MCHC: 33.1 g/dL (ref 30.0–36.0)
MCV: 91.3 fL (ref 80.0–100.0)
Platelets: 210 10*3/uL (ref 150–400)
RBC: 4.47 MIL/uL (ref 3.87–5.11)
RDW: 13.2 % (ref 11.5–15.5)
WBC: 5.4 10*3/uL (ref 4.0–10.5)
nRBC: 0 % (ref 0.0–0.2)

## 2018-10-02 LAB — TROPONIN I
Troponin I: <0.03 ng/mL (ref <0.03)
Troponin I: <0.03 ng/mL (ref <0.03)

## 2018-10-02 LAB — BRAIN NATRIURETIC PEPTIDE: B Natriuretic Peptide: 73 pg/mL (ref 0.0–100.0)

## 2018-10-02 NOTE — Telephone Encounter (Signed)
Patient experienced bilateral lower extremity edema from her feet to above the knees. Left >Right. Noticed this 4 days ago (Friday). Put on knee hi compression stockings yesterday. Swelling has decreased each day and is minimal today, this is without elevating her legs. No warmth or redness to the legs/no discoloration to ankles/feet.Tender to touch the anterior area of the calves. Legs felt overworked yesterday-not today. Denies any new SOB that is not her normal. Stated she has a significant family hx of heart disease. She had surgery last year for squamous cell carcinoma on one or both legs last year. Reviewed symptoms warranting immediate evaluation. Stated she understood.appointment made for tomorrow with PCP.  Reason for Disposition . [1] MODERATE leg swelling (e.g., swelling extends up to knees) AND [2] new onset or worsening  Answer Assessment - Initial Assessment Questions 1. ONSET: "When did the swelling start?" (e.g., minutes, hours, days)     Noticed lower bilateral extremity edema on Friday, 4 days ago.  2. LOCATION: "What part of the leg is swollen?"  "Are both legs swollen or just one leg?"     Left was more swollen than the right. Both legs were swollen above the knees and down including her toes.  3. SEVERITY: "How bad is the swelling?" (e.g., localized; mild, moderate, severe)  - Localized - small area of swelling localized to one leg  - MILD pedal edema - swelling limited to foot and ankle, pitting edema < 1/4 inch (6 mm) deep, rest and elevation eliminate most or all swelling  - MODERATE edema - swelling of lower leg to knee, pitting edema > 1/4 inch (6 mm) deep, rest and elevation only partially reduce swelling  - SEVERE edema - swelling extends above knee, facial or hand swelling present      All legs, did not notice swelling at hands or face. Uncomfortable to walk on Friday but not since.  4. REDNESS: "Does the swelling look red or infected?"    no 5. PAIN: "Is the swelling  painful to touch?" If so, ask: "How painful is it?"   (Scale 1-10; mild, moderate or severe)     Not painful today. 6. FEVER: "Do you have a fever?" If so, ask: "What is it, how was it measured, and when did it start?"      no 7. CAUSE: "What do you think is causing the leg swelling?"     Does not know. 8. MEDICAL HISTORY: "Do you have a history of heart failure, kidney disease, liver failure, or cancer?"     Squamous cell carcinoma on her legs last year.  9. RECURRENT SYMPTOM: "Have you had leg swelling before?" If so, ask: "When was the last time?" "What happened that time?"     no 10. OTHER SYMPTOMS: "Do you have any other symptoms?" (e.g., chest pain, difficulty breathing)       none 11. PREGNANCY: "Is there any chance you are pregnant?" "When was your last menstrual period?"       no  Protocols used: LEG SWELLING AND EDEMA-A-AH

## 2018-10-02 NOTE — Discharge Instructions (Signed)
Go to your doctor tomorrow as scheduled.  Call Dr. Donivan Scull office to make an appointment as soon as you are able to.  Return to the ER immediately for new, worsening, or persistent chest pain or discomfort, shortness of breath, weakness, or any other new or worsening symptoms that concern you.

## 2018-10-02 NOTE — Telephone Encounter (Signed)
Agree with ED visit   [place]

## 2018-10-02 NOTE — ED Provider Notes (Signed)
[facility] Emergency Department Provider Note ____________________________________________   First MD Initiated Contact with Patient 10/02/18 1839     (approximate)  I have reviewed the triage vital signs and the nursing notes.   HISTORY  Chief Complaint Chest Pain    HPI [name] is a 78 y.o. female with PMH as noted below including possible history of CAD although no stents and no active treatment who presents with chest pain, acute onset around 2 PM, described somewhat pressure-like, nonexertional, and resolving after approximately 30 minutes.  She also reports some discomfort in her jaw earlier but not directly associated with the pain.  She denies shortness of breath, lightheadedness, or any nausea or vomiting.  No prior history of this pain.  She states the pain started when she was doing her taxes but she otherwise has not been significantly stressed.  She reports some bilateral lower extremity swelling intermittently over the last few weeks but it has now mostly resolved.  It has been symmetrical.  No significant lower extremity pain.  Past Medical History:  Diagnosis Date  . Acid reflux   . Allergy   . Anemia   . Anxiety    in past  . Arthritis    all over  . Atypical moles   . Bronchitis   . Bronchospasm    reactive with perfume/cologne and smoke  . CAD (coronary artery disease)    patient denies on preop of 10/24   . Candida esophagitis ([place])   . Carotid artery calcification, bilateral 04/2016  . Cataract   . Cervical stenosis of spine   . Chronic insomnia   . Complication of anesthesia    shoulder surgery- not all the way asleep  . COPD (chronic obstructive pulmonary disease) (HCC)    chronic bronchitis   . Depression    in past  . Diverticulitis   . Diverticulosis   . Dizziness   . Elevated liver enzymes   . Family history of adverse reaction to anesthesia    sister- has problems with nausea and vomiting   . Fatigue   .  Fatty liver   . Generalized headache    migraines  . GERD (gastroesophageal reflux disease)   . History of chemotherapy    topical cream for h/o skin CA  . History of kidney stones   . History of neuropathy    FH Dr. Posey Pronto neurology  . History of vertigo   . Hx of migraines   . Hyperlipidemia   . Hypothyroidism   . Insomnia   . Irritable bowel syndrome   . LLQ abdominal pain   . Melanoma ([place]) 2013  . Melanoma ([name])    x2 stg II melanoma  . Neuropathy, peripheral   . Peripheral neuropathy   . Pneumonia    hx of x 2  . PONV (postoperative nausea and vomiting)   . Pre-diabetes   . Retina disorder    left  . SCC (squamous cell carcinoma)    skin   . Stroke [facility])    ? TIA per Dr V per patient   . Thyroid disease   . Urine incontinence   . Vitamin D deficiency   . Weight gain     Patient Active Problem List   Diagnosis Date Noted  . Idiopathic neuropathy 09/13/2018  . History of stroke 09/13/2018  . History of squamous cell carcinoma of skin 09/13/2018  . History of depression 09/13/2018  . History of migraine 09/13/2018  .  Fatty liver 09/13/2018  . GERD (gastroesophageal reflux disease) 09/13/2018  . History of memory loss 09/13/2018  . Prediabetes 09/13/2018  . Hiatal hernia 09/13/2018  . Female stress incontinence 05/08/2017  . Pain in pelvis 05/08/2017  . Coronary artery calcification seen on CAT scan 04/09/2017  . Carotid stenosis, asymptomatic, bilateral 04/09/2017  . Mixed hyperlipidemia 04/09/2017  . MCI (mild cognitive impairment) 12/17/2015  . Urinary frequency 08/09/2015  . Hypothyroidism 08/09/2015  . Syncope 08/08/2015  . Posterior vitreous detachment of right eye 07/23/2015  . Hereditary and idiopathic peripheral neuropathy 05/11/2015  . Epiretinal membrane, left 04/12/2012  . Nuclear cataract 04/12/2012  . Breast pain in female 07/18/2011  . Melanoma of back ([place]) 02/28/2011    Past Surgical History:  Procedure Laterality Date  . BILATERAL  SALPINGOOPHORECTOMY     ovarian cyst Dr. Ouida Sills and Dr. Alisia Ferrari 2008  . BREAST SURGERY     in 2014 bx   . BREAST SURGERY     augmentation with saline Dr. Hubbard Hartshorn 1995   . CHOLECYSTECTOMY N/A 01/28/2015   Procedure: LAPAROSCOPIC CHOLECYSTECTOMY WITH INTRAOPERATIVE CHOLANGIOGRAM;  Surgeon: Autumn Messing III, MD;  Location: [place];  Service: General;  Laterality: N/A;  . CLOSED MANIPULATION SHOULDER     rt  . COLONOSCOPY WITH PROPOFOL N/A 11/20/2012   Procedure: COLONOSCOPY WITH PROPOFOL;  Surgeon: Garlan Fair, MD;  Location: [facility];  Service: Endoscopy;  Laterality: N/A;  . colonscopy     May 2014  . JOINT REPLACEMENT     RTK  . KNEE ARTHROSCOPY    . left knee miniscus tear repair    . MELANOMA EXCISION WITH SENTINEL LYMPH NODE BIOPSY  03/18/11   Back, nodes neg  . OOPHORECTOMY    . ROTATOR CUFF REPAIR     left  . SHOULDER SURGERY     right and left   . TOTAL HIP ARTHROPLASTY    . TOTAL KNEE ARTHROPLASTY     right 2010 and left in 2018   . TOTAL KNEE ARTHROPLASTY Left 05/16/2017   Procedure: LEFT TOTAL KNEE ARTHROPLASTY;  Surgeon: Paralee Cancel, MD;  Location: [facility];  Service: Orthopedics;  Laterality: Left;  70 mins  . TUBAL LIGATION    . TUMOR REMOVAL     ovary    Prior to Admission medications   Medication Sig Start Date End Date Taking? Authorizing Provider  albuterol (PROVENTIL HFA;VENTOLIN HFA) 108 (90 Base) MCG/ACT inhaler Inhale 1-2 puffs into the lungs every 6 (six) hours as needed for wheezing or shortness of breath. 09/13/18   McLean-Scocuzza, Nino Glow, MD  Apoaequorin (PREVAGEN) 10 MG CAPS Take by mouth.    [provider]  Ascorbic Acid (VITAMIN C) 1000 MG tablet Take 1,000 mg by mouth daily.    [provider]  aspirin 81 MG tablet TAKE 1 TABLET BY MOUTH EVERY DAY 04/23/18   [provider]  atorvastatin (LIPITOR) 10 MG tablet Take 10 mg daily by mouth.     [provider]  Biotin 1000 MCG tablet Take 1,000 mcg by mouth  daily.     [provider]  Calcium 600-200 MG-UNIT tablet Take 1 tablet daily by mouth.    [provider]  Cholecalciferol (VITAMIN D3) 2000 UNITS capsule Take 2,000 Units by mouth daily.    [provider]  Coenzyme Q10 (COQ10) 30 MG CAPS Take by mouth.    [provider]  dextromethorphan-guaiFENesin (MUCINEX DM) 30-600 MG 12hr tablet Take 1 tablet by mouth  as needed.     [provider]  levothyroxine (SYNTHROID) 50 MCG tablet Take 1 tablet (50 mcg total) by mouth daily before breakfast. 08/10/15   Kelvin Cellar, MD  Multiple Vitamins-Minerals (PRESERVISION AREDS 2 PO) Take 1 capsule by mouth daily.    [provider]  Omega-3 Fatty Acids (FISH OIL) 1000 MG CAPS Take 1,000 mg by mouth daily.     [provider]  omeprazole (PRILOSEC) 20 MG capsule Take 1 capsule (20 mg total) by mouth daily. 10/01/18   Esterwood, Amy S, PA-C  oxybutynin (DITROPAN) 5 MG tablet Take 1 tablet by mouth 2 (two) times daily. 08/10/18   [provider]  vitamin B-12 (CYANOCOBALAMIN) 1000 MCG tablet Take 2,000 mcg daily by mouth. 2 tabs    [provider]    Allergies Epinephrine; Penicillins; Lidocaine hcl; Other; and Valium [diazepam]  Family History  Problem Relation Age of Onset  . Hypertension Mother   . Stroke Mother   . Hyperlipidemia Mother   . Heart disease Father   . Diabetes Father   . Hypertension Father   . Hyperlipidemia Father   . Early death Father   . Diabetes Sister   . Heart disease Sister        heart murmur  . Arthritis Sister   . COPD Sister   . Hyperlipidemia Sister   . Hypertension Sister   . Pancreatic cancer Maternal Uncle   . Alzheimer's disease Paternal Aunt   . Colon cancer Maternal Grandfather   . Cancer Maternal Grandfather   . Early death Maternal Grandfather   . Alzheimer's disease Paternal Aunt   . Alzheimer's disease Paternal Aunt   . Colon polyps Sister        adenomous  .  Arthritis Sister   . Heart disease Sister   . Hyperlipidemia Sister   . Hypertension Sister   . Cancer Sister   . Heart disease Daughter   . Hyperlipidemia Daughter   . Hypertension Daughter   . Heart disease Son   . Hyperlipidemia Son   . Alcohol abuse Son   . Depression Son   . Diabetes Son   . Stroke Maternal Grandmother   . Early death Paternal Grandmother   . Stroke Paternal Grandmother   . Cancer Other        2 paternal uncles and 1 maternal uncle colon cancer    Social History Social History   Tobacco Use  . Smoking status: Never Smoker  . Smokeless tobacco: Never Used  Substance Use Topics  . Alcohol use: Yes    Alcohol/week: 1.0 standard drinks    Types: 1 Cans of beer per week    Comment: once a week beer or wine  . Drug use: No    Review of Systems  Constitutional: No fever. Eyes: No redness. ENT: No neck pain. Cardiovascular: Positive for resolved chest pain. Respiratory: Denies shortness of breath. Gastrointestinal: No nausea or vomiting. Genitourinary: Negative for flank pain.  Musculoskeletal: Negative for back pain. Skin: Negative for rash. Neurological: Negative for headache.   ____________________________________________   PHYSICAL EXAM:  VITAL SIGNS: ED Triage Vitals  Enc Vitals Group     BP 10/02/18 1729 (!) 182/80     Pulse Rate 10/02/18 1729 72     Resp 10/02/18 1729 18     Temp 10/02/18 1729 98.2 F (36.8 C)     Temp Source 10/02/18 1729 Oral     SpO2 10/02/18 1729 98 %  Weight 10/02/18 1730 186 lb 9.6 oz (84.6 kg)     Height 10/02/18 1730 5' 4.57" (1.64 m)     Head Circumference --      Peak Flow --      Pain Score 10/02/18 1730 6     Pain Loc --      Pain Edu? --      Excl. in [place]? --     Constitutional: Alert and oriented. Well appearing for age and in no acute distress. Eyes: Conjunctivae are normal.  Head: Atraumatic. Nose: No congestion/rhinnorhea. Mouth/Throat: Mucous membranes are moist.   Neck: Normal  range of motion.  Cardiovascular: Normal rate, regular rhythm. Grossly normal heart sounds.  Good peripheral circulation. Respiratory: Normal respiratory effort.  No retractions. Lungs CTAB. Gastrointestinal: No distention.  Musculoskeletal: Trace bilateral lower extremity edema.  No calf or popliteal swelling or tenderness.  Extremities warm and well perfused.  Neurologic:  Normal speech and language. No gross focal neurologic deficits are appreciated.  Skin:  Skin is warm and dry. No rash noted. Psychiatric: Mood and affect are normal. Speech and behavior are normal.  ____________________________________________   LABS (all labs ordered are listed, but only abnormal results are displayed)  Labs Reviewed  BASIC METABOLIC PANEL  CBC  TROPONIN I  BRAIN NATRIURETIC PEPTIDE  TROPONIN I   ____________________________________________  EKG  ED ECG REPORT I, Arta Silence, the attending physician, personally viewed and interpreted this ECG.  Date: 10/02/2018 EKG Time: 1727 Rate: 69 Rhythm: normal sinus rhythm QRS Axis: normal Intervals: normal ST/T Wave abnormalities: normal Narrative Interpretation: no evidence of acute ischemia; no significant change when compared to EKG of 04/11/2017  ____________________________________________  RADIOLOGY  CXR: No focal infiltrate or other acute abnormality  ____________________________________________   PROCEDURES  Procedure(s) performed: No  Procedures  Critical Care performed: No ____________________________________________   INITIAL IMPRESSION / ASSESSMENT AND PLAN / ED COURSE  Pertinent labs & imaging results that were available during my care of the patient were reviewed by me and considered in my medical decision making (see chart for details).  78 year old female with PMH as noted above presents with chest pain acute onset earlier this afternoon, lasting 30 minutes and now resolved.  No significant associated  symptoms.  No prior history of this pain.  The patient also reports some lower extremity bilateral swelling recently but has resolved.  I reviewed the past medical records in [place].  The patient has no recent ED visits or admissions.  Last echocardiogram in 2017 shows normal EF.  On exam, the patient is very well-appearing for her age.  She is currently asymptomatic.  Vital signs are normal except for hypertension.  The remainder of the exam is unremarkable.  Her EKG is nonischemic.  Although the patient does have increased risk for ACS, I have a low suspicion for cardiac etiology on this presentation.  There is no clinical evidence to suggest DVT or PE, and no evidence of aortic dissection or other vascular etiology especially given the reassuring vital signs and resolved symptoms.  The initial labs including troponin and the chest x-ray are normal.  We will obtain repeat troponin after 3 hours.  If it is negative and the patient remains asymptomatic, I anticipate discharge home.  ----------------------------------------- 8:32 PM on 10/02/2018 -----------------------------------------  Repeat troponin is pending.  I signed the patient out to the oncoming physician Dr. Burlene Arnt.  Plan will be discharged home if it is negative.  The patient has follow-up with her PMD already scheduled  for tomorrow.   ____________________________________________   FINAL CLINICAL IMPRESSION(S) / ED DIAGNOSES  Final diagnoses:  Nonspecific chest pain      NEW MEDICATIONS STARTED DURING THIS VISIT:  New Prescriptions   No medications on file     Note:  This document was prepared using Dragon voice recognition software and may include unintentional dictation errors.    Arta Silence, MD 10/02/18 2032

## 2018-10-02 NOTE — Telephone Encounter (Signed)
This encounter was created in error - please disregard.

## 2018-10-02 NOTE — Telephone Encounter (Signed)
Pt going to ED.  

## 2018-10-02 NOTE — ED Triage Notes (Signed)
PT arrives with complaints of chest pain that pt reports is in the center of her chest. Pt reports the pain as a tightness. Pt states the pain radiates to her jaw and makes her short of breath & dizzy. PT states she was lightheaded this morning and then her chest pain started 2 hours prior. PT reports her first concerning symptom was lower leg swelling that started last Friday.

## 2018-10-02 NOTE — Telephone Encounter (Addendum)
Patient called and says she's now experiencing tightness in her chest and SOB, dizzy earlier but not now. She says she was told to call back if new symptoms. I advised since she has leg swelling and now with the new symptoms, go to ED and have someone to drive her. She says her cousin is coming to take her now.

## 2018-10-03 ENCOUNTER — Other Ambulatory Visit: Payer: Self-pay

## 2018-10-03 ENCOUNTER — Telehealth: Payer: Self-pay | Admitting: Internal Medicine

## 2018-10-03 ENCOUNTER — Encounter: Payer: Self-pay | Admitting: Internal Medicine

## 2018-10-03 ENCOUNTER — Ambulatory Visit (INDEPENDENT_AMBULATORY_CARE_PROVIDER_SITE_OTHER): Payer: PPO | Admitting: Internal Medicine

## 2018-10-03 ENCOUNTER — Ambulatory Visit: Payer: PPO | Admitting: Internal Medicine

## 2018-10-03 ENCOUNTER — Ambulatory Visit
Admission: RE | Admit: 2018-10-03 | Discharge: 2018-10-03 | Disposition: A | Discharge status: Home / Self Care | Payer: PPO | Source: Ambulatory Visit | Attending: Internal Medicine | Admitting: Internal Medicine

## 2018-10-03 VITALS — BP 136/70 | HR 82 | Temp 97.9°F | Ht 64.57 in | Wt 188.0 lb

## 2018-10-03 DIAGNOSIS — B3781 Candidal esophagitis: Secondary | ICD-10-CM

## 2018-10-03 DIAGNOSIS — Z1231 Encounter for screening mammogram for malignant neoplasm of breast: Secondary | ICD-10-CM | POA: Diagnosis not present

## 2018-10-03 DIAGNOSIS — R079 Chest pain, unspecified: Secondary | ICD-10-CM | POA: Diagnosis not present

## 2018-10-03 DIAGNOSIS — B370 Candidal stomatitis: Secondary | ICD-10-CM | POA: Diagnosis not present

## 2018-10-03 DIAGNOSIS — R05 Cough: Secondary | ICD-10-CM | POA: Diagnosis not present

## 2018-10-03 DIAGNOSIS — N3281 Overactive bladder: Secondary | ICD-10-CM

## 2018-10-03 DIAGNOSIS — R600 Localized edema: Secondary | ICD-10-CM | POA: Insufficient documentation

## 2018-10-03 DIAGNOSIS — I2510 Atherosclerotic heart disease of native coronary artery without angina pectoris: Secondary | ICD-10-CM | POA: Diagnosis not present

## 2018-10-03 DIAGNOSIS — R053 Chronic cough: Secondary | ICD-10-CM

## 2018-10-03 MED ORDER — FLUCONAZOLE 150 MG PO TABS: 150.0000 mg | ORAL_TABLET | Freq: Once | ORAL | 0 refills | Status: AC

## 2018-10-03 MED ORDER — NYSTATIN 100000 UNIT/ML MT SUSP: 5.0000 mL | Freq: Four times a day (QID) | OROMUCOSAL | 0 refills | Status: DC

## 2018-10-03 MED ORDER — FUROSEMIDE 20 MG PO TABS: 20.0000 mg | ORAL_TABLET | Freq: Every day | ORAL | 5 refills | Status: DC

## 2018-10-03 MED ORDER — MIRABEGRON ER 25 MG PO TB24: 25.0000 mg | ORAL_TABLET | Freq: Every day | ORAL | Status: DC

## 2018-10-03 NOTE — Progress Notes (Signed)
Pre visit review using our clinic review tool, if applicable. No additional management support is needed unless otherwise documented below in the visit note. 

## 2018-10-03 NOTE — Telephone Encounter (Signed)
Caryl Pina from [facility] called with result of Venous U/S.  IMPRESSION: No evidence of deep venous thrombosis in either lower extremity. Flow at [facility] at [employer] to the office.

## 2018-10-03 NOTE — Patient Instructions (Signed)
I referred you to [facility] pulmonary/lung and Dr Rockey Situ (cardiology)   Cough, Adult  Coughing is a reflex that clears your throat and your airways. Coughing helps to heal and protect your lungs. It is normal to cough occasionally, but a cough that happens with other symptoms or lasts a long time may be a sign of a condition that needs treatment. A cough may last only 2-3 weeks (acute), or it may last longer than 8 weeks (chronic). What are the causes? Coughing is commonly caused by:  Breathing in substances that irritate your lungs.  A viral or bacterial respiratory infection.  Allergies.  Asthma.  Postnasal drip.  Smoking.  Acid backing up from the stomach into the esophagus (gastroesophageal reflux).  Certain medicines.  Chronic lung problems, including COPD (or rarely, lung cancer).  Other medical conditions such as heart failure. Follow these instructions at home: Pay attention to any changes in your symptoms. Take these actions to help with your discomfort:  Take medicines only as told by your health care provider. ? If you were prescribed an antibiotic medicine, take it as told by your health care provider. Do not stop taking the antibiotic even if you start to feel better. ? Talk with your health care provider before you take a cough suppressant medicine.  Drink enough fluid to keep your urine clear or pale yellow.  If the air is dry, use a cold steam vaporizer or humidifier in your bedroom or your home to help loosen secretions.  Avoid anything that causes you to cough at work or at home.  If your cough is worse at night, try sleeping in a semi-upright position.  Avoid cigarette smoke. If you smoke, quit smoking. If you need help quitting, ask your health care provider.  Avoid caffeine.  Avoid alcohol.  Rest as needed. Contact a health care provider if:  You have new symptoms.  You cough up pus.  Your cough does not get better after 2-3 weeks, or your  cough gets worse.  You cannot control your cough with suppressant medicines and you are losing sleep.  You develop pain that is getting worse or pain that is not controlled with pain medicines.  You have a fever.  You have unexplained weight loss.  You have night sweats. Get help right away if:  You cough up blood.  You have difficulty breathing.  Your heartbeat is very fast. This information is not intended to replace advice given to you by your health care provider. Make sure you discuss any questions you have with your health care provider. Document Released: 12/31/2010 Document Revised: 12/10/2015 Document Reviewed: 09/10/2014 Elsevier Interactive Patient Education  2019 [employer].

## 2018-10-03 NOTE — Progress Notes (Addendum)
Chief Complaint  Patient presents with  . Follow-up   ED f/u for chest pain and b/l leg edema L>R  1. Chest pain was tight and heavy troponin negative x 2 in the ED BNP negative she has not seen cardiology in a while h/o CAD on CT scan 2. C/o sore tongue and c/w thrush and yeast infection in esophagus again wants nystatin refilled  3. Chronic cough since 06/2018 she has been on 3 different antibiotics and still has cough though she is taking prilosec at times cough is not there but is intermittently.   4. Overactive bladder could not tolerate oxybutynin so on myrbetriq now 25 mg qd XL  5. Leg edema was worse but improved w/o any treatment though still there    Review of Systems  Constitutional: Negative for weight loss.  HENT: Negative for hearing loss.   Eyes: Negative for blurred vision.  Respiratory: Positive for cough.   Cardiovascular: Positive for chest pain and leg swelling.  Gastrointestinal: Negative for abdominal pain.  Musculoskeletal: Negative for falls.  Skin: Negative for rash.  Neurological: Negative for headaches.  Psychiatric/Behavioral: Positive for memory loss. Negative for depression.   Past Medical History:  Diagnosis Date  . Acid reflux   . Allergy   . Anemia   . Anxiety    in past  . Arthritis    all over  . Atypical moles   . Bronchitis   . Bronchospasm    reactive with perfume/cologne and smoke  . CAD (coronary artery disease)    patient denies on preop of 10/24   . Candida esophagitis ([place])   . Carotid artery calcification, bilateral 04/2016  . Cataract   . Cervical stenosis of spine   . Chronic insomnia   . Complication of anesthesia    shoulder surgery- not all the way asleep  . COPD (chronic obstructive pulmonary disease) (HCC)    chronic bronchitis   . Depression    in past  . Diverticulitis   . Diverticulosis   . Dizziness   . Elevated liver enzymes   . Family history of adverse reaction to anesthesia    sister- has problems with  nausea and vomiting   . Fatigue   . Fatty liver   . Generalized headache    migraines  . GERD (gastroesophageal reflux disease)   . History of chemotherapy    topical cream for h/o skin CA  . History of kidney stones   . History of neuropathy    FH Dr. Posey Pronto neurology  . History of vertigo   . Hx of migraines   . Hyperlipidemia   . Hypothyroidism   . Insomnia   . Irritable bowel syndrome   . LLQ abdominal pain   . Melanoma ([place]) 2013  . Melanoma ([place])    x2 stg II melanoma  . Neuropathy, peripheral   . Peripheral neuropathy   . Pneumonia    hx of x 2  . PONV (postoperative nausea and vomiting)   . Pre-diabetes   . Retina disorder    left  . SCC (squamous cell carcinoma)    skin   . Stroke [facility])    ? TIA per Dr V per patient   . Thyroid disease   . Urine incontinence   . Vitamin D deficiency   . Weight gain    Past Surgical History:  Procedure Laterality Date  . BILATERAL SALPINGOOPHORECTOMY     ovarian cyst Dr. Ouida Sills and Dr. Alisia Ferrari 2008  .  BREAST SURGERY     in 2014 bx   . BREAST SURGERY     augmentation with saline Dr. Hubbard Hartshorn 1995   . CHOLECYSTECTOMY N/A 01/28/2015   Procedure: LAPAROSCOPIC CHOLECYSTECTOMY WITH INTRAOPERATIVE CHOLANGIOGRAM;  Surgeon: Autumn Messing III, MD;  Location: [facility];  Service: General;  Laterality: N/A;  . CLOSED MANIPULATION SHOULDER     rt  . COLONOSCOPY WITH PROPOFOL N/A 11/20/2012   Procedure: COLONOSCOPY WITH PROPOFOL;  Surgeon: Garlan Fair, MD;  Location: [facility];  Service: Endoscopy;  Laterality: N/A;  . colonscopy     May 2014  . JOINT REPLACEMENT     RTK  . KNEE ARTHROSCOPY    . left knee miniscus tear repair    . MELANOMA EXCISION WITH SENTINEL LYMPH NODE BIOPSY  03/18/11   Back, nodes neg  . OOPHORECTOMY    . ROTATOR CUFF REPAIR     left  . SHOULDER SURGERY     right and left   . TOTAL HIP ARTHROPLASTY    . TOTAL KNEE ARTHROPLASTY     right 2010 and left in 2018   . TOTAL KNEE ARTHROPLASTY Left  05/16/2017   Procedure: LEFT TOTAL KNEE ARTHROPLASTY;  Surgeon: Paralee Cancel, MD;  Location: [facility];  Service: Orthopedics;  Laterality: Left;  70 mins  . TUBAL LIGATION    . TUMOR REMOVAL     ovary   Family History  Problem Relation Age of Onset  . Hypertension Mother   . Stroke Mother   . Hyperlipidemia Mother   . Heart disease Father   . Diabetes Father   . Hypertension Father   . Hyperlipidemia Father   . Early death Father   . Diabetes Sister   . Heart disease Sister        heart murmur  . Arthritis Sister   . COPD Sister   . Hyperlipidemia Sister   . Hypertension Sister   . Pancreatic cancer Maternal Uncle   . Alzheimer's disease Paternal Aunt   . Colon cancer Maternal Grandfather   . Cancer Maternal Grandfather   . Early death Maternal Grandfather   . Alzheimer's disease Paternal Aunt   . Alzheimer's disease Paternal Aunt   . Colon polyps Sister        adenomous  . Arthritis Sister   . Heart disease Sister   . Hyperlipidemia Sister   . Hypertension Sister   . Cancer Sister   . Heart disease Daughter   . Hyperlipidemia Daughter   . Hypertension Daughter   . Heart disease Son   . Hyperlipidemia Son   . Alcohol abuse Son   . Depression Son   . Diabetes Son   . Stroke Maternal Grandmother   . Early death Paternal Grandmother   . Stroke Paternal Grandmother   . Cancer Other        2 paternal uncles and 1 maternal uncle colon cancer   Social History   Socioeconomic History  . Marital status: Divorced    Spouse name: Not on file  . Number of children: 3  . Years of education: College  . Highest education level: Some college, no degree  Occupational History  . Occupation: Retired    Fish farm manager: RETIRED  Social Needs  . Financial resource strain: Not on file  . Food insecurity:    Worry: Not on file    Inability: Not on file  . Transportation needs:    Medical: Not on file    Non-medical: Not  on file  Tobacco Use  . Smoking status: Never Smoker  .  Smokeless tobacco: Never Used  Substance and Sexual Activity  . Alcohol use: Yes    Alcohol/week: 1.0 standard drinks    Types: 1 Cans of beer per week    Comment: once a week beer or wine  . Drug use: No  . Sexual activity: Not Currently  Lifestyle  . Physical activity:    Days per week: Not on file    Minutes per session: Not on file  . Stress: Not on file  Relationships  . Social connections:    Talks on phone: Not on file    Gets together: Not on file    Attends religious service: Not on file    Active member of club or organization: Not on file    Attends meetings of clubs or organizations: Not on file    Relationship status: Not on file  . Intimate partner violence:    Fear of current or ex partner: Not on file    Emotionally abused: Not on file    Physically abused: Not on file    Forced sexual activity: Not on file  Other Topics Concern  . Not on file  Social History Narrative   Patient lives at home alone in a one story home.   Has 3 children (2 daughters and 1 son)    Retired from [employer]   Divorced since 1992    Caffeine Use: 1 cup daily   No guns    Wears seat belt    Current Meds  Medication Sig  . albuterol (PROVENTIL HFA;VENTOLIN HFA) 108 (90 Base) MCG/ACT inhaler Inhale 1-2 puffs into the lungs every 6 (six) hours as needed for wheezing or shortness of breath.  . Apoaequorin (PREVAGEN) 10 MG CAPS Take by mouth.  . Ascorbic Acid (VITAMIN C) 1000 MG tablet Take 1,000 mg by mouth daily.  Marland Kitchen aspirin 81 MG tablet TAKE 1 TABLET BY MOUTH EVERY DAY  . atorvastatin (LIPITOR) 10 MG tablet Take 10 mg daily by mouth.   . Biotin 1000 MCG tablet Take 1,000 mcg by mouth daily.   . Calcium 600-200 MG-UNIT tablet Take 1 tablet daily by mouth.  . Cholecalciferol (VITAMIN D3) 2000 UNITS capsule Take 2,000 Units by mouth daily.  . Coenzyme Q10 (COQ10) 30 MG CAPS Take by mouth.  . dextromethorphan-guaiFENesin (MUCINEX DM) 30-600 MG 12hr tablet Take 1 tablet  by mouth as needed.   Marland Kitchen levothyroxine (SYNTHROID) 50 MCG tablet Take 1 tablet (50 mcg total) by mouth daily before breakfast.  . Multiple Vitamins-Minerals (PRESERVISION AREDS 2 PO) Take 1 capsule by mouth daily.  . Omega-3 Fatty Acids (FISH OIL) 1000 MG CAPS Take 1,000 mg by mouth daily.   Marland Kitchen omeprazole (PRILOSEC) 20 MG capsule Take 1 capsule (20 mg total) by mouth daily.  Marland Kitchen oxybutynin (DITROPAN) 5 MG tablet Take 1 tablet by mouth 2 (two) times daily.  . vitamin B-12 (CYANOCOBALAMIN) 1000 MCG tablet Take 2,000 mcg daily by mouth. 2 tabs   Allergies  Allergen Reactions  . Epinephrine Other (See Comments) and Anaphylaxis    Heart palpitations  . Penicillins Swelling and Other (See Comments)    Lip swelling Has patient had a PCN reaction causing immediate rash, facial/tongue/throat swelling, SOB or lightheadedness with hypotension: Yes Has patient had a PCN reaction causing severe rash involving mucus membranes or skin necrosis: No Has patient had a PCN reaction that required hospitalization No Has patient had  a PCN reaction occurring within the last 10 years: No If all of the above answers are "NO", then may proceed with Cephalosporin use.  . Lidocaine Hcl Other (See Comments)  . Other     Black pepper- caused bronchial tubes to start closing   . Valium [Diazepam] Other (See Comments)    Other reaction(s): Other (See Comments) Overly sensitive  sedation   Recent Results (from the past 2160 hour(s))  Basic metabolic panel     Status: None   Collection Time: 10/02/18  5:31 PM  Result Value Ref Range   Sodium 142 135 - 145 mmol/L   Potassium 3.5 3.5 - 5.1 mmol/L   Chloride 105 98 - 111 mmol/L   CO2 25 22 - 32 mmol/L   Glucose, Bld 99 70 - 99 mg/dL   BUN 13 8 - 23 mg/dL   Creatinine, Ser 0.88 0.44 - 1.00 mg/dL   Calcium 9.2 8.9 - 10.3 mg/dL   GFR calc non Af Amer >60 >60 mL/min   GFR calc Af Amer >60 >60 mL/min   Anion gap 12 5 - 15    Comment: Performed at [facility],  [address] [place], [place] [address]  CBC     Status: None   Collection Time: 10/02/18  5:31 PM  Result Value Ref Range   WBC 5.4 4.0 - 10.5 K/uL   RBC 4.47 3.87 - 5.11 MIL/uL   Hemoglobin 13.5 12.0 - 15.0 g/dL   HCT 40.8 36.0 - 46.0 %   MCV 91.3 80.0 - 100.0 fL   MCH 30.2 26.0 - 34.0 pg   MCHC 33.1 30.0 - 36.0 g/dL   RDW 13.2 11.5 - 15.5 %   Platelets 210 150 - 400 K/uL   nRBC 0.0 0.0 - 0.2 %    Comment: Performed at [facility], [address] [place], [place] [address]  Troponin I - ONCE - STAT     Status: None   Collection Time: 10/02/18  5:31 PM  Result Value Ref Range   Troponin I <0.03 <0.03 ng/mL    Comment: Performed at [facility], [address] [place], [place] [address]  Brain natriuretic peptide     Status: None   Collection Time: 10/02/18  5:35 PM  Result Value Ref Range   B Natriuretic Peptide 73.0 0.0 - 100.0 pg/mL    Comment: Performed at [facility], [address] [place], [place] [address]  Troponin I - ONCE - STAT     Status: None   Collection Time: 10/02/18  8:17 PM  Result Value Ref Range   Troponin I <0.03 <0.03 ng/mL    Comment: Performed at [facility], [address] [place], [place] [address]   Objective  Body mass index is 31.7 kg/m. Wt Readings from Last 3 Encounters:  10/03/18 188 lb (85.3 kg)  10/02/18 186 lb 9.6 oz (84.6 kg)  09/13/18 186 lb 9.6 oz (84.6 kg)   Temp Readings from Last 3 Encounters:  10/03/18 97.9 F (36.6 C) (Oral)  10/02/18 98.3 F (36.8 C) (Oral)  09/13/18 98 F (36.7 C) (Oral)   BP Readings from Last 3 Encounters:  10/03/18 136/70  10/02/18 (!) 149/81  09/13/18 122/66   Pulse Readings from Last 3 Encounters:  10/03/18 82  10/02/18 70  09/13/18 68    Physical Exam Vitals signs and nursing note reviewed.  Constitutional:      Appearance: Normal appearance. She is well-developed and well-groomed.  She is obese.  HENT:     Head: Normocephalic and  atraumatic.     Nose: Nose normal.     Mouth/Throat:     Mouth: Mucous membranes are moist.     Pharynx: Oropharynx is clear.  Eyes:     Conjunctiva/sclera: Conjunctivae normal.     Pupils: Pupils are equal, round, and reactive to light.  Cardiovascular:     Rate and Rhythm: Normal rate and regular rhythm.     Heart sounds: Normal heart sounds. No murmur.     Comments: 1 to 2+ leg edema b/l  Pulmonary:     Effort: Pulmonary effort is normal.     Breath sounds: Normal breath sounds.  Musculoskeletal:     Right lower leg: Edema present.  Skin:    General: Skin is warm and dry.  Neurological:     General: No focal deficit present.     Mental Status: She is alert and oriented to person, place, and time. Mental status is at baseline.     Gait: Gait normal.  Psychiatric:        Attention and Perception: Attention and perception normal.        Mood and Affect: Mood and affect normal.        Speech: Speech normal.        Behavior: Behavior normal. Behavior is cooperative.        Thought Content: Thought content normal.        Cognition and Memory: Cognition and memory normal.        Judgment: Judgment normal.     Assessment   1. Chest pain/tightness h/o CAD  2. Leg edema b/l  3. H/o candida esophagitis and thrush  4. Chronic cough  5. Overactive bladder  6.  Plan   1. CXR 10/02/2018 negative  Refer back to Dr. Rockey Situ further w/u  2. Trial of lasix 20 mg qd  Stat US r/o DVT 3. Diflucan and nystatin swish and swallow  4. Refer to pulm to further w/u se notes care everywhere  5. On myrbetriq 25 mg qd now  6.  sch fasting labs 11/2018 and f/u 11/2018 end or 12/2018   Flu shot had 04/12/18  Tdap -had TD 10/18/12 not pertussis will need this in future  shingrix will need  zostervax had 06/16/11  prevnar had 05/15/14  Pna 23 had 12/16/05, 05/18/16  -reviewed records from [place] and updated vaccines in my note   Pap out of age window LMP [place] get records due spring  2020 -will need with implants -10/18/16 screening with implants neg FH breast cancer  p cousins dx'ed in 86s x 68, sister  Had at age 68 y.o and colon cancer age 67, 2 paternal cousins ovarian cancer age 30 and 57   Colonoscopy 11/2012 tortuous colon no further rec. Consider cologuard in future if no h/o polyps DEXA -[facility] 06/28/16 normal reviewed report did not scan into chart -h/o vitamin D def on D3 2000 IU daily   Prediabetes A1C 08/15/18 6.1 04/2018 60  CBC normal 08/15/18, CMET Cr. 1.0 GFR 54 (slightly low), UA rare bacteria, TSH 1.524   [facility] saw 01/01/19 increased nocturia and urgency pending Botox to tx and UDS Dr. Junious Silk and try myrbetriq before UDS   Cards-Dr. Rockey Situ  [facility]  Eye MD Dr. Cordelia Pen [facility]-retinal specialist  Dentist Dr. Lynelle Smoke  Neurology-[facility]  ENT-Dr. Tami Ribas for vestibular balance issue s Podiatry Dr. Milinda Pointer  H/o  D.r [facility]   Dermatology Dr. Delman Cheadle in [place] h/o SCC left leg and MM right leg, neck and back with node dissection Dr. Marlou Starks 8/12   Former PCP Dr. Jossie Ng requested labs, all vaccines, DEXA  -pt brought in all records week of 09/21/2018 and reviewed  -h/o iron def anemia  -last seen 07/09/18 and 08/09/18 possible thrush and URI  US carotid 05/13/16 <50% stenosis in right and left ICA -she was at this time put on lipitor 10 mg qhs   CT ab/pelvis 07/26/17 left base scarring lung, well circumcised liver lesion likely cysts and stable hepatomegaly 19.4 cm cranlocaudal pancreas/spleen normal, extensive colonic diverticulosis, aortic and branch vessell atherosclerosis no adenopathy, normal uterus and adnexa, advanced LS spondylosis grade 1 L4/5 and L5/S1 anterolisthesis are similar, pelvic floor laxity, hiatal hernia  CXR 02/26/18 thoracic aorta mildly atherosclerotic unchanged, Deg changes T spine, Deg changes b/l shoulders prior right shoulder surgery calcification distal left supraspinatus tendon at its insertion on the great tuberosity  of the left humerus   UA normal 07/09/18  04/23/18 A1c 6.0  04/23/18 CBC 6.2/14.2/42.9/plts 199  04/23/18 CMET Cr 0.88 BUN 20 EGFR 62, total bili chronically elevated 1.1 (prior direct bili low 0.1 likely unconjugated high) AST15, ALT 14 normal  04/23/18 lipidTC 155, TG 143, HDL 68, LDL 58 10/17/17 vitamin D 41.1 04/18/16 33.7  TSH 06/29/17 2.06  FOBT neg x 3 05/19/16     Provider: Dr. Olivia Mackie McLean-[facility]oc[facility]-Internal Medicine

## 2018-10-04 ENCOUNTER — Telehealth: Payer: Self-pay | Admitting: Cardiovascular Disease

## 2018-10-04 NOTE — Telephone Encounter (Signed)
Referral message sent by the front desk. Dr. Aundra Dubin- Jacklynn Lewis has requested that the patient be seen and evaluated again by Dr. Rockey Situ for chest pain.  I called and spoke with the patient. She states she has been having and "uncomfortable" feeling in her chest, like a "heaviness." At times she finds it hard to breath and some intermittent pain from her ear down her jaw. She has been having this since prior to early December 2019.  The patient reports she has been recovering from bronchitis/ pneumonia since 06/25/18.  She did have a course of levaquin. She is still dealing with a residual cough per her report. She denies fever. She denies traveling recently.  She states she does have a strong family history of heart disease.   I have advised her in light of the COVID-19 pandemic, we are trying to push patients out at least 6-8 weeks if possible for appointments.  She was evaluated in the ER on 3/17 and her troponin levels were stable x 3.   She is aware I will send a message to Dr. Rockey Situ to review, but I do not feel it is urgent at this time that she is seen.  The patient confirms she feels stable at this time.  She is aware we will call back with Dr. Donivan Scull recommendations for the timing of her follow up appointment.

## 2018-10-06 NOTE — Telephone Encounter (Signed)
Would be appropriate for an evisit/televisit when set up

## 2018-10-08 NOTE — Telephone Encounter (Signed)
Awaiting process for "E-visit."

## 2018-10-09 NOTE — Telephone Encounter (Signed)
-----   Message from Minna Merritts, MD sent at 10/06/2018  9:44 PM EDT ----- Regarding: evisit Atypical chestpain

## 2018-10-09 NOTE — Telephone Encounter (Signed)
TELEPHONE CALL NOTE  [name] has been deemed a candidate for a follow-up tele-health visit to limit community exposure during the Covid-19 pandemic. I spoke with the patient via phone to ensure availability of phone/video source, confirm preferred email & phone number, discuss instructions and expectations, and review consent.   I reminded [name] to be prepared with any vital sign and/or heart rhythm information that could potentially be obtained via home monitoring, at the time of her visit.  Finally, I reminded [name] to expect an e-mail containing a link for their video-based visit approximately 15 minutes before her visit, or alternatively, a phone call at the time of her visit if her visit is planned to be a phone encounter.  Did the patient verbally consent to treatment as below? yes  [name] 10/09/2018 2:29 PM  DOWNLOADING THE SOFTWARE (If applicable)  Download the [employer] app to enable video and telephone visits with your [facility] Provider.   Instructions for downloading [employer]: - Go to https://www.webex.com/downloads.html and follow the instructions - If you have technical difficulties with downloading WebEx, please call WebEx at [phone]. - Once the app is downloaded (can be done on either mobile or desktop computer), go to Settings in the upper left hand corner.  Be sure that camera and audio are enabled.  - You will receive an email message with a link to the meeting with a time to join for your tele-health visit.  - Please download the app and have settings configured prior to the appointment time.    CONSENT FOR TELE-HEALTH VISIT - PLEASE REVIEW  I hereby voluntarily request, consent and authorize [place] and its employed or contracted physicians, physician assistants, nurse practitioners or other licensed health care professionals (the Practitioner), to provide me with telemedicine health care services (the  Services") as deemed necessary by the treating Practitioner. I acknowledge and consent to receive the Services by the Practitioner via telemedicine. I understand that the telemedicine visit will involve communicating with the Practitioner through live audiovisual communication technology and the disclosure of certain medical information by electronic transmission. I acknowledge that I have been given the opportunity to request an in-person assessment or other available alternative prior to the telemedicine visit and am voluntarily participating in the telemedicine visit.  I understand that I have the right to withhold or withdraw my consent to the use of telemedicine in the course of my care at any time, without affecting my right to future care or treatment, and that the Practitioner or I may terminate the telemedicine visit at any time. I understand that I have the right to inspect all information obtained and/or recorded in the course of the telemedicine visit and may receive copies of available information for a reasonable fee.  I understand that some of the potential risks of receiving the Services via telemedicine include:   Delay or interruption in medical evaluation due to technological equipment failure or disruption;  Information transmitted may not be sufficient (e.g. poor resolution of images) to allow for appropriate medical decision making by the Practitioner; and/or   In rare instances, security protocols could fail, causing a breach of personal health information.  Furthermore, I acknowledge that it is my responsibility to provide information about my medical history, conditions and care that is complete and accurate to the best of my ability. I acknowledge that Practitioner's advice, recommendations, and/or decision may be based on factors not within their control, such as incomplete  or inaccurate data provided by me or distortions of diagnostic images or specimens that may result from  electronic transmissions. I understand that the practice of medicine is not an exact science and that Practitioner makes no warranties or guarantees regarding treatment outcomes. I acknowledge that I will receive a copy of this consent concurrently upon execution via email to the email address I last provided but may also request a printed copy by calling the office of [place].    I understand that my insurance will be billed for this visit.   I have read or had this consent read to me.  I understand the contents of this consent, which adequately explains the benefits and risks of the Services being provided via telemedicine.   I have been provided ample opportunity to ask questions regarding this consent and the Services and have had my questions answered to my satisfaction.  I give my informed consent for the services to be provided through the use of telemedicine in my medical care  By participating in this telemedicine visit I agree to the above.

## 2018-10-10 ENCOUNTER — Telehealth (INDEPENDENT_AMBULATORY_CARE_PROVIDER_SITE_OTHER): Payer: PPO | Admitting: Cardiovascular Disease

## 2018-10-10 ENCOUNTER — Other Ambulatory Visit: Payer: Self-pay

## 2018-10-10 ENCOUNTER — Encounter: Payer: Self-pay | Admitting: Internal Medicine

## 2018-10-10 DIAGNOSIS — R0789 Other chest pain: Secondary | ICD-10-CM | POA: Insufficient documentation

## 2018-10-10 DIAGNOSIS — I700 Atherosclerosis of aorta: Secondary | ICD-10-CM | POA: Diagnosis not present

## 2018-10-10 DIAGNOSIS — I2510 Atherosclerotic heart disease of native coronary artery without angina pectoris: Secondary | ICD-10-CM | POA: Diagnosis not present

## 2018-10-10 DIAGNOSIS — E782 Mixed hyperlipidemia: Secondary | ICD-10-CM

## 2018-10-10 NOTE — Progress Notes (Signed)
Telephone Visit     Evaluation Performed:  Follow-up visit  This visit type was conducted due to national recommendations for restrictions regarding the COVID-19 Pandemic (e.g. social distancing).  This format is felt to be most appropriate for this patient at this time.  All issues noted in this document were discussed and addressed.  No physical exam was performed (except for noted visual exam findings with Telehealth visits).  See MyChart message from today for the patient's consent to telehealth for [facility].  Date:  10/10/2018   ID:  [name], DOB [birth year], MRN [id number]  Patient Location:  [place] [address]   Provider location:   [facility], [place] office  PCP:  McLean-Scocuzza, Nino Glow, MD  Cardiologist:  No primary care provider on file.   Chief Complaint:   SOB, chest tightness   History of Present Illness:    [name] is a 78 y.o. female who presents via audio/video conferencing for a telehealth visit today.   The patient does not symptoms concerning for COVID-19 infection (fever, chills, cough, or new SHORTNESS OF BREATH).  Please refer to prior office visit for complete details: Patient has a past medical history of  CAD and aortic athero on CT scan  lightheadedness,  Mild carotid dz Hyperlipidemia morbid obesity Syncope S/p right TKR [place] visit today for coronary disease aortic atherosclerosis  Next day 2 pm, tightness in chest In the emergency room October 02, 2022 chest pain Hospital records reviewed with the patient in detail Pressure feeling nonexertional resolved after 30 minutes Mild discomfort in her jaw Intermittent bilateral lower extremity edema resolved Hypertensive in the emergency room  Gone to bed, funny chest feeling Took asa Went away  Tired all the time Weight up Does not go to gym , sedentary  Has hiatal hernia and GERD Takes PPI   Pain from ear to neck , atypical  CT ABD:  mild ABD athero There is 3 vessel coronary calcification  2017 and 2018   Prior CV studies:   The following studies were reviewed today:  Previous imaging and studies below reviewed with her in detail on today's visit Stress test 04/2016: no ischemia EF >55%  Carotid u/s <39% b/l  CT chest 12/2016 Images pulled up in the office and reviewed with her Very mild Aortic and branch vessel atherosclerosis. Mild cardiomegaly, without pericardial effusion. LAD coronary artery atherosclerosis.    Past Medical History:  Diagnosis Date  . Acid reflux   . Allergy   . Anemia   . Anxiety    in past  . Arthritis    all over  . Atypical moles   . Bronchitis   . Bronchospasm    reactive with perfume/cologne and smoke  . CAD (coronary artery disease)    patient denies on preop of 10/24   . Candida esophagitis ([place])   . Carotid artery calcification, bilateral 04/2016  . Cataract   . Cervical stenosis of spine   . Chronic insomnia   . Complication of anesthesia    shoulder surgery- not all the way asleep  . COPD (chronic obstructive pulmonary disease) (HCC)    chronic bronchitis   . Depression    in past  . Diverticulitis   . Diverticulosis   . Dizziness   . Elevated liver enzymes   . Family history of adverse reaction to anesthesia    sister- has problems with nausea and vomiting   . Fatigue   .  Fatty liver   . Generalized headache    migraines  . GERD (gastroesophageal reflux disease)   . History of chemotherapy    topical cream for h/o skin CA  . History of kidney stones   . History of neuropathy    FH Dr. Posey Pronto neurology  . History of vertigo   . Hx of migraines   . Hyperlipidemia   . Hypothyroidism   . Insomnia   . Irritable bowel syndrome   . LLQ abdominal pain   . Melanoma ([place]) 2013  . Melanoma ([place])    x2 stg II melanoma  . Neuropathy, peripheral   . Peripheral neuropathy   . Pneumonia    hx of x 2  . PONV (postoperative nausea and vomiting)   .  Pre-diabetes   . Retina disorder    left  . SCC (squamous cell carcinoma)    skin   . Stroke [facility])    ? TIA per Dr V per patient   . Thyroid disease   . Urine incontinence   . Vitamin D deficiency   . Weight gain    Past Surgical History:  Procedure Laterality Date  . BILATERAL SALPINGOOPHORECTOMY     ovarian cyst Dr. Ouida Sills and Dr. Alisia Ferrari 2008  . BREAST SURGERY     in 2014 bx   . BREAST SURGERY     augmentation with saline Dr. Hubbard Hartshorn 1995   . CHOLECYSTECTOMY N/A 01/28/2015   Procedure: LAPAROSCOPIC CHOLECYSTECTOMY WITH INTRAOPERATIVE CHOLANGIOGRAM;  Surgeon: Autumn Messing III, MD;  Location: [facility] [facility];  Service: General;  Laterality: N/A;  . CLOSED MANIPULATION SHOULDER     rt  . COLONOSCOPY WITH PROPOFOL N/A 11/20/2012   Procedure: COLONOSCOPY WITH PROPOFOL;  Surgeon: Garlan Fair, MD;  [facility];  Service: Endoscopy;  Laterality: N/A;  . colonscopy     May 2014  . JOINT REPLACEMENT     RTK  . KNEE ARTHROSCOPY    . left knee miniscus tear repair    . MELANOMA EXCISION WITH SENTINEL LYMPH NODE BIOPSY  03/18/11   Back, nodes neg  . OOPHORECTOMY    . ROTATOR CUFF REPAIR     left  . SHOULDER SURGERY     right and left   . TOTAL HIP ARTHROPLASTY    . TOTAL KNEE ARTHROPLASTY     right 2010 and left in 2018   . TOTAL KNEE ARTHROPLASTY Left 05/16/2017   Procedure: LEFT TOTAL KNEE ARTHROPLASTY;  Surgeon: Paralee Cancel, MD;  [facility];  Service: Orthopedics;  Laterality: Left;  70 mins  . TUBAL LIGATION    . TUMOR REMOVAL     ovary     No outpatient medications have been marked as taking for the 10/10/18 encounter (Telemedicine) with Minna Merritts, MD.     Allergies:   Epinephrine; Penicillins; Lidocaine hcl; Other; and Valium [diazepam]   Social History   Tobacco Use  . Smoking status: Never Smoker  . Smokeless tobacco: Never Used  Substance Use Topics  . Alcohol use: Yes    Alcohol/week: 1.0 standard drinks    Types: 1 Cans of beer per  week    Comment: once a week beer or wine  . Drug use: No     Family Hx: The patient's family history includes Alcohol abuse in her son; Alzheimer's disease in her paternal aunt, paternal aunt, and paternal aunt; Arthritis in her sister and sister; COPD in her sister; Cancer in her maternal grandfather, sister,  and another family member; Colon cancer in her maternal grandfather; Colon polyps in her sister; Depression in her son; Diabetes in her father, sister, and son; Early death in her father, maternal grandfather, and paternal grandmother; Heart disease in her daughter, father, sister, sister, and son; Hyperlipidemia in her daughter, father, mother, sister, sister, and son; Hypertension in her daughter, father, mother, sister, and sister; Pancreatic cancer in her maternal uncle; Stroke in her maternal grandmother, mother, and paternal grandmother.  ROS:   Please see the history of present illness.    Review of Systems  Constitutional: Negative.   Respiratory: Positive for shortness of breath.   Cardiovascular: Negative.        Chest tightness  Gastrointestinal: Negative.   Musculoskeletal: Negative.   Neurological: Negative.   Psychiatric/Behavioral: Negative.   All other systems reviewed and are negative.     Labs/Other Tests and Data Reviewed:    Recent Labs: 10/02/2018: B Natriuretic Peptide 73.0; BUN 13; Creatinine, Ser 0.88; Hemoglobin 13.5; Platelets 210; Potassium 3.5; Sodium 142   Recent Lipid Panel Lab Results  Component Value Date/Time   CHOL 181 12/22/2014 04:32 AM   TRIG 79 12/22/2014 04:32 AM   HDL 57 12/22/2014 04:32 AM   CHOLHDL 3.2 12/22/2014 04:32 AM   LDLCALC 108 (H) 12/22/2014 04:32 AM    Wt Readings from Last 3 Encounters:  10/03/18 188 lb (85.3 kg)  10/02/18 186 lb 9.6 oz (84.6 kg)  09/13/18 186 lb 9.6 oz (84.6 kg)     Exam:    Vital Signs:  There were no vitals taken for this visit.   Well nourished, well developed female in no acute distress.    ASSESSMENT & PLAN:    Aortic atherosclerosis (HCC) Seen on CT, mild On statin  Coronary artery calcification seen on CAT scan Scan in 2017 and 2018 Goal LDL <70   Mixed hyperlipidemia She will stay on lipitor Goal LDL <70  Chest tightness Long discussion concerning her chest pain symptoms Somewhat atypical, but has coronary calcification Strong family history She prefers to wait on a stress test Sedentary, she will try a walking program Does not want NTG for now Has hiatal hernia She will call us back for worsening symptoms, stress test could be ordered  SOB: Has been referred to pulmonary deconditioned    COVID-19 Education: The signs and symptoms of COVID-19 were discussed with the patient and how to seek care for testing (follow up with PCP or arrange E-visit).  The importance of social distancing was discussed today.  Patient Risk:   After full review of this patients clinical status, I feel that they are at least moderate risk at this time.  Time:   Today, I have spent 25 minutes with the patient with telehealth technology discussing .     Medication Adjustments/Labs and Tests Ordered: Current medicines are reviewed at length with the patient today.  Concerns regarding medicines are outlined above.   Tests Ordered: No tests ordered   Medication Changes: No changes made   Disposition: Follow-up in 6 months   Signed, Ida Rogue, MD  10/10/2018 11:52 AM    [facility] [address], [place], [place] [address]

## 2018-10-10 NOTE — Patient Instructions (Signed)

## 2018-10-12 NOTE — Progress Notes (Signed)
66 m recall entered.  Patient should call when ready to schedule.  Removing from Scheduling pool.

## 2018-10-17 ENCOUNTER — Telehealth: Payer: Self-pay | Admitting: Internal Medicine

## 2018-10-17 NOTE — Telephone Encounter (Signed)
Pt called an l/m on the  refill voicemail stating that her dog bit on her hand, she said that it red , punctured next her thumb, swollen, and very painful. She said she is using otc first aid cream . Wants someone call her back to set up appt.

## 2018-10-18 ENCOUNTER — Telehealth: Payer: Self-pay

## 2018-10-18 NOTE — Telephone Encounter (Signed)
Copied from [place] [id number]. Topic: General - Other >> Oct 18, 2018  8:48 AM Leward Quan A wrote: Reason for CRM: Patient called back e-mail address was updated and she also stated that she does not have a smart phone and is not tech savvy so a phone visit is all she can do. Please advise.

## 2018-10-18 NOTE — Telephone Encounter (Signed)
Left message for patient to return call back. PEC may give and obtain information. Trying schedule virtual visit for today with Dr Olivia Mackie.  Need current email address.

## 2018-10-18 NOTE — Telephone Encounter (Signed)
No appointment was made. It can be a phone visit.

## 2018-10-24 DIAGNOSIS — E1122 Type 2 diabetes mellitus with diabetic chronic kidney disease: Secondary | ICD-10-CM | POA: Diagnosis not present

## 2018-10-24 DIAGNOSIS — E785 Hyperlipidemia, unspecified: Secondary | ICD-10-CM | POA: Diagnosis not present

## 2018-10-24 DIAGNOSIS — M170 Bilateral primary osteoarthritis of knee: Secondary | ICD-10-CM | POA: Diagnosis not present

## 2018-10-24 DIAGNOSIS — N183 Chronic kidney disease, stage 3 (moderate): Secondary | ICD-10-CM | POA: Diagnosis not present

## 2018-10-24 DIAGNOSIS — M25511 Pain in right shoulder: Secondary | ICD-10-CM | POA: Diagnosis not present

## 2018-10-24 DIAGNOSIS — M75101 Unspecified rotator cuff tear or rupture of right shoulder, not specified as traumatic: Secondary | ICD-10-CM | POA: Diagnosis not present

## 2018-10-26 ENCOUNTER — Ambulatory Visit: Payer: Self-pay

## 2018-10-26 DIAGNOSIS — I493 Ventricular premature depolarization: Secondary | ICD-10-CM | POA: Diagnosis not present

## 2018-10-26 DIAGNOSIS — M25511 Pain in right shoulder: Secondary | ICD-10-CM | POA: Diagnosis not present

## 2018-10-26 DIAGNOSIS — M1711 Unilateral primary osteoarthritis, right knee: Secondary | ICD-10-CM | POA: Diagnosis not present

## 2018-10-26 DIAGNOSIS — M25611 Stiffness of right shoulder, not elsewhere classified: Secondary | ICD-10-CM | POA: Diagnosis not present

## 2018-10-26 NOTE — Telephone Encounter (Signed)
  Pt c/o rash like a sunburn for the past 2 weeks. Pt stated that the rash is red and is located to chest and to face. No bumps or spots or itching. Pt c/o burning and feels hot. Care advice given and pt verbalized understanding. Pt put on the Saturday schedule.        Reason for Disposition . Localized rash present > 7 days  Answer Assessment - Initial Assessment Questions 1. APPEARANCE of RASH: "Describe the rash."      Red rash like a sunburn 2. LOCATION: "Where is the rash located?"      Located to face and chest  3. NUMBER: "How many spots are there?"      No spots 4. SIZE: "How big are the spots?" (Inches, centimeters or compare to size of a coin)      No spots 5. ONSET: "When did the rash start?"      2 weeks ago 6. ITCHING: "Does the rash itch?" If so, ask: "How bad is the itch?"  (Scale 1-10; or mild, moderate, severe)     no 7. PAIN: "Does the rash hurt?" If so, ask: "How bad is the pain?"  (Scale 1-10; or mild, moderate, severe)    Burns and hot-mild 8. OTHER SYMPTOMS: "Do you have any other symptoms?" (e.g., fever)     no 9. PREGNANCY: "Is there any chance you are pregnant?" "When was your last menstrual period?"     n/a  Protocols used: RASH OR REDNESS - LOCALIZED-A-AH

## 2018-10-27 ENCOUNTER — Encounter: Payer: Self-pay | Admitting: Family Medicine

## 2018-10-27 ENCOUNTER — Ambulatory Visit (INDEPENDENT_AMBULATORY_CARE_PROVIDER_SITE_OTHER): Payer: PPO | Admitting: Family Medicine

## 2018-10-27 DIAGNOSIS — R232 Flushing: Secondary | ICD-10-CM | POA: Insufficient documentation

## 2018-10-27 NOTE — Progress Notes (Signed)
[name] - 78 y.o. female MRN [id number]  Date of birth: 14-Feb-1941   This visit type was conducted due to national recommendations for restrictions regarding the COVID-19 Pandemic (e.g. social distancing).  This format is felt to be most appropriate for this patient at this time.  All issues noted in this document were discussed and addressed.  No physical exam was performed (except for noted visual exam findings with Video Visits).  I discussed the limitations of evaluation and management by telemedicine and the availability of in person appointments. The patient expressed understanding and agreed to proceed.  I connected with@ on 10/27/18 at 12:40 PM EDT by a video enabled telemedicine application and verified that I am speaking with the correct person using two identifiers.  Interactive audio and video telecommunications were attempted between this provider and patient, however failed, due to patient having technical difficulties OR patient did not have access to video capability.  We continued and completed visit with audio only.     Patient Location: Home [place] [address]   Provider location:   [facility]  Chief Complaint  Patient presents with  . Hot Flashes    on face/chest , swelling in legs- pink in color(not today)Recent ABX/Pred taper    HPI  [name] is a 78 y.o. female who presents via audio/video conferencing for a telehealth visit today.  She has complaint of recurrent flushing feeling located on her chest.  Has had associated redness/rash across chest.  Sometimes flushing will involve neck and face.  She reports that this started about 6 weeks ago.  She has not had any medication changes that would have contributed to this.  She did get a dog and was started on myrbetriq recently however this was after symptoms had started.  Reports recent mold mitigation/remediation in December for home she purchased last year.  Is having additional  carpet removed soon but there may be residual mold under some areas.  She does have some mild congestion but denies increased shortness of breath, wheezing, or dizziness.  She has not had itching associated with the flushing.  She has not noticed any association with particular foods.  She drinks a glass of wine occasionally and has not noticed symptoms after this either.     ROS:  A comprehensive ROS was completed and negative except as noted per HPI  Past Medical History:  Diagnosis Date  . Acid reflux   . Allergy   . Anemia   . Anxiety    in past  . Arthritis    all over  . Atypical moles   . Bronchitis   . Bronchospasm    reactive with perfume/cologne and smoke  . CAD (coronary artery disease)    patient denies on preop of 10/24   . Candida esophagitis ([place])   . Carotid artery calcification, bilateral 04/2016  . Cataract   . Cervical stenosis of spine   . Chronic insomnia   . Complication of anesthesia    shoulder surgery- not all the way asleep  . COPD (chronic obstructive pulmonary disease) (HCC)    chronic bronchitis   . Depression    in past  . Diverticulitis   . Diverticulosis   . Dizziness   . Elevated liver enzymes   . Family history of adverse reaction to anesthesia    sister- has problems with nausea and vomiting   . Fatigue   . Fatty liver   . Generalized headache  migraines  . GERD (gastroesophageal reflux disease)   . History of chemotherapy    topical cream for h/o skin CA  . History of kidney stones   . History of neuropathy    FH Dr. Posey Pronto neurology  . History of vertigo   . Hx of migraines   . Hyperlipidemia   . Hypothyroidism   . Insomnia   . Irritable bowel syndrome   . LLQ abdominal pain   . Melanoma ([place]) 2013  . Melanoma ([place])    x2 stg II melanoma  . Neuropathy, peripheral   . Peripheral neuropathy   . Pneumonia    hx of x 2  . PONV (postoperative nausea and vomiting)   . Pre-diabetes   . Retina disorder    left  . SCC  (squamous cell carcinoma)    skin   . Stroke [facility])    ? TIA per Dr V per patient   . Thyroid disease   . Urine incontinence   . Vitamin D deficiency   . Weight gain     Past Surgical History:  Procedure Laterality Date  . BILATERAL SALPINGOOPHORECTOMY     ovarian cyst Dr. Ouida Sills and Dr. Alisia Ferrari 2008  . BREAST SURGERY     in 2014 bx   . BREAST SURGERY     augmentation with saline Dr. Hubbard Hartshorn 1995   . CHOLECYSTECTOMY N/A 01/28/2015   Procedure: LAPAROSCOPIC CHOLECYSTECTOMY WITH INTRAOPERATIVE CHOLANGIOGRAM;  Surgeon: Autumn Messing III, MD;  Location: [facility];  Service: General;  Laterality: N/A;  . CLOSED MANIPULATION SHOULDER     rt  . COLONOSCOPY WITH PROPOFOL N/A 11/20/2012   Procedure: COLONOSCOPY WITH PROPOFOL;  Surgeon: Garlan Fair, MD;  [facility];  Service: Endoscopy;  Laterality: N/A;  . colonscopy     May 2014  . JOINT REPLACEMENT     RTK  . KNEE ARTHROSCOPY    . left knee miniscus tear repair    . MELANOMA EXCISION WITH SENTINEL LYMPH NODE BIOPSY  03/18/11   Back, nodes neg  . OOPHORECTOMY    . ROTATOR CUFF REPAIR     left  . SHOULDER SURGERY     right and left   . TOTAL HIP ARTHROPLASTY    . TOTAL KNEE ARTHROPLASTY     right 2010 and left in 2018   . TOTAL KNEE ARTHROPLASTY Left 05/16/2017   Procedure: LEFT TOTAL KNEE ARTHROPLASTY;  Surgeon: Paralee Cancel, MD;  [facility];  Service: Orthopedics;  Laterality: Left;  70 mins  . TUBAL LIGATION    . TUMOR REMOVAL     ovary    Family History  Problem Relation Age of Onset  . Hypertension Mother   . Stroke Mother   . Hyperlipidemia Mother   . Heart disease Father   . Diabetes Father   . Hypertension Father   . Hyperlipidemia Father   . Early death Father   . Diabetes Sister   . Heart disease Sister        heart murmur  . Arthritis Sister   . COPD Sister   . Hyperlipidemia Sister   . Hypertension Sister   . Pancreatic cancer Maternal Uncle   . Alzheimer's disease Paternal Aunt    . Colon cancer Maternal Grandfather   . Cancer Maternal Grandfather   . Early death Maternal Grandfather   . Alzheimer's disease Paternal Aunt   . Alzheimer's disease Paternal Aunt   . Colon polyps Sister  adenomous  . Arthritis Sister   . Heart disease Sister   . Hyperlipidemia Sister   . Hypertension Sister   . Cancer Sister   . Heart disease Daughter   . Hyperlipidemia Daughter   . Hypertension Daughter   . Heart disease Son   . Hyperlipidemia Son   . Alcohol abuse Son   . Depression Son   . Diabetes Son   . Stroke Maternal Grandmother   . Early death Paternal Grandmother   . Stroke Paternal Grandmother   . Cancer Other        2 paternal uncles and 1 maternal uncle colon cancer    Social History   Socioeconomic History  . Marital status: Divorced    Spouse name: Not on file  . Number of children: 3  . Years of education: College  . Highest education level: Some college, no degree  Occupational History  . Occupation: Retired    Fish farm manager: RETIRED  Social Needs  . Financial resource strain: Not on file  . Food insecurity:    Worry: Not on file    Inability: Not on file  . Transportation needs:    Medical: Not on file    Non-medical: Not on file  Tobacco Use  . Smoking status: Never Smoker  . Smokeless tobacco: Never Used  Substance and Sexual Activity  . Alcohol use: Yes    Alcohol/week: 1.0 standard drinks    Types: 1 Cans of beer per week    Comment: once a week beer or wine  . Drug use: No  . Sexual activity: Not Currently  Lifestyle  . Physical activity:    Days per week: Not on file    Minutes per session: Not on file  . Stress: Not on file  Relationships  . Social connections:    Talks on phone: Not on file    Gets together: Not on file    Attends religious service: Not on file    Active member of club or organization: Not on file    Attends meetings of clubs or organizations: Not on file    Relationship status: Not on file  . Intimate  partner violence:    Fear of current or ex partner: Not on file    Emotionally abused: Not on file    Physically abused: Not on file    Forced sexual activity: Not on file  Other Topics Concern  . Not on file  Social History Narrative   Patient lives at home alone in a one story home.   Has 3 children (2 daughters and 1 son)    Retired from [employer]   Divorced since 1992    Caffeine Use: 1 cup daily   No guns    Wears seat belt      Current Outpatient Medications:  .  Apoaequorin (PREVAGEN) 10 MG CAPS, Take by mouth., Disp: , Rfl:  .  Ascorbic Acid (VITAMIN C) 1000 MG tablet, Take 1,000 mg by mouth daily., Disp: , Rfl:  .  aspirin 81 MG tablet, TAKE 1 TABLET BY MOUTH EVERY DAY, Disp: , Rfl:  .  atorvastatin (LIPITOR) 10 MG tablet, Take 10 mg daily by mouth. , Disp: , Rfl:  .  Biotin 1000 MCG tablet, Take 1,000 mcg by mouth daily. , Disp: , Rfl:  .  Calcium 600-200 MG-UNIT tablet, Take 1 tablet daily by mouth., Disp: , Rfl:  .  Cholecalciferol (VITAMIN D3) 2000 UNITS capsule, Take 2,000 Units by mouth  daily., Disp: , Rfl:  .  Coenzyme Q10 (COQ10) 30 MG CAPS, Take by mouth., Disp: , Rfl:  .  dextromethorphan-guaiFENesin (MUCINEX DM) 30-600 MG 12hr tablet, Take 1 tablet by mouth as needed. , Disp: , Rfl:  .  furosemide (LASIX) 20 MG tablet, Take 1 tablet (20 mg total) by mouth daily. In am for leg swelling, Disp: 30 tablet, Rfl: 5 .  levothyroxine (SYNTHROID) 50 MCG tablet, Take 1 tablet (50 mcg total) by mouth daily before breakfast., Disp: 30 tablet, Rfl: 1 .  mirabegron ER (MYRBETRIQ) 25 MG TB24 tablet, Take 1 tablet (25 mg total) by mouth daily., Disp: 30 tablet, Rfl:  .  Multiple Vitamins-Minerals (PRESERVISION AREDS 2 PO), Take 1 capsule by mouth daily., Disp: , Rfl:  .  nystatin (MYCOSTATIN) 100000 UNIT/ML suspension, Take 5 mLs (500,000 Units total) by mouth 4 (four) times daily. Swish and swallow, Disp: 473 mL, Rfl: 0 .  Omega-3 Fatty Acids (FISH OIL) 1000 MG  CAPS, Take 1,000 mg by mouth daily. , Disp: , Rfl:  .  omeprazole (PRILOSEC) 20 MG capsule, Take 1 capsule (20 mg total) by mouth daily., Disp: 30 capsule, Rfl: 1 .  oxybutynin (DITROPAN) 5 MG tablet, oxybutynin chloride 5 mg tablet, Disp: , Rfl:  .  sertraline (ZOLOFT) 50 MG tablet, sertraline 50 mg tablet  TAKE 1 TABLET BY MOUTH EVERY DAY, Disp: , Rfl:  .  vitamin B-12 (CYANOCOBALAMIN) 1000 MCG tablet, Take 2,000 mcg daily by mouth. 2 tabs, Disp: , Rfl:  .  albuterol (PROAIR HFA) 108 (90 Base) MCG/ACT inhaler, ProAir HFA 90 mcg/actuation aerosol inhaler  INHALE 1 2 PUFFS INTO THE LUNGS EVERY 6 (SIX) HOURS AS NEEDED FOR WHEEZING OR SHORTNESS OF BREATH., Disp: , Rfl:  .  benzonatate (TESSALON) 200 MG capsule, benzonatate 200 mg capsule  TAKE 1 CAPSULE BY MOUTH THREE TIMES A DAY AS NEEDED FOR COUGH FOR UP TO 7 DAYS, Disp: , Rfl:  .  buPROPion (WELLBUTRIN XL) 150 MG 24 hr tablet, bupropion HCl XL 150 mg 24 hr tablet, extended release  TAKE 1 TABLET BY MOUTH EVERY DAY IN THE MORNING, Disp: , Rfl:  .  diclofenac sodium (VOLTAREN) 1 % GEL, Voltaren 1 % topical gel  APPLY 4 GRAMS TO THE AFFECTED AREA(S) BY TOPICAL ROUTE 4 TIMES PER DAY, Disp: , Rfl:  .  fluticasone (FLONASE) 50 MCG/ACT nasal spray, fluticasone propionate 50 mcg/actuation nasal spray,suspension  SPRAY 2 SPRAYS INTO EACH NOSTRIL EVERY DAY, Disp: , Rfl:   EXAM:  VITALS per patient if applicable: Temp 70.0 F (36.6 C) (Oral) Comment: Pt reports from [place] 10/2018  Wt 184 lb (83.5 kg) Comment: pt wt @ hm.  BMI 31.03 kg/m   GENERAL: alert, oriented, nad   LUNGS:no obvious gross SOB, gasping or wheezing   PSYCH/NEURO: pleasant and cooperative, no obvious depression or anxiety, speech and thought processing grossly intact  ASSESSMENT AND PLAN:  Discussed the following assessment and plan:  Flushing -Unclear etiology, ?related to allergies -Reviewed medications and do not see anything that was recently started that would cause  this.  -Recommend that she keep a journal of symptoms with timing, foods/beverages eaten that day, where she is at when this occurs, stress/anxiety level, and medications taken.  -Recommend trial of cetirizine to see if this improves her symptoms at all. -F/u with PCP if not improving.        I discussed the assessment and treatment plan with the patient. The patient was provided an opportunity  to ask questions and all were answered. The patient agreed with the plan and demonstrated an understanding of the instructions.   The patient was advised to call back or seek an in-person evaluation if the symptoms worsen or if the condition fails to improve as anticipated.     Luetta Nutting, DO

## 2018-10-27 NOTE — Assessment & Plan Note (Signed)
-  Unclear etiology, ?related to allergies -Reviewed medications and do not see anything that was recently started that would cause this.  -Recommend that she keep a journal of symptoms with timing, foods/beverages eaten that day, where she is at when this occurs, stress/anxiety level, and medications taken.  -Recommend trial of cetirizine to see if this improves her symptoms at all. -F/u with PCP if not improving.

## 2018-11-05 DIAGNOSIS — M25511 Pain in right shoulder: Secondary | ICD-10-CM | POA: Diagnosis not present

## 2018-11-05 DIAGNOSIS — M25611 Stiffness of right shoulder, not elsewhere classified: Secondary | ICD-10-CM | POA: Diagnosis not present

## 2018-11-09 DIAGNOSIS — M25511 Pain in right shoulder: Secondary | ICD-10-CM | POA: Diagnosis not present

## 2018-11-09 DIAGNOSIS — M25611 Stiffness of right shoulder, not elsewhere classified: Secondary | ICD-10-CM | POA: Diagnosis not present

## 2018-11-22 DIAGNOSIS — M25611 Stiffness of right shoulder, not elsewhere classified: Secondary | ICD-10-CM | POA: Diagnosis not present

## 2018-11-22 DIAGNOSIS — M25511 Pain in right shoulder: Secondary | ICD-10-CM | POA: Diagnosis not present

## 2018-12-05 ENCOUNTER — Other Ambulatory Visit: Payer: Self-pay

## 2018-12-05 ENCOUNTER — Other Ambulatory Visit (INDEPENDENT_AMBULATORY_CARE_PROVIDER_SITE_OTHER): Payer: PPO

## 2018-12-05 DIAGNOSIS — M25511 Pain in right shoulder: Secondary | ICD-10-CM | POA: Diagnosis not present

## 2018-12-05 DIAGNOSIS — Z13818 Encounter for screening for other digestive system disorders: Secondary | ICD-10-CM

## 2018-12-05 DIAGNOSIS — R05 Cough: Secondary | ICD-10-CM

## 2018-12-05 DIAGNOSIS — Z1159 Encounter for screening for other viral diseases: Secondary | ICD-10-CM

## 2018-12-05 DIAGNOSIS — E538 Deficiency of other specified B group vitamins: Secondary | ICD-10-CM | POA: Diagnosis not present

## 2018-12-05 DIAGNOSIS — G609 Hereditary and idiopathic neuropathy, unspecified: Secondary | ICD-10-CM | POA: Diagnosis not present

## 2018-12-05 DIAGNOSIS — Z0000 Encounter for general adult medical examination without abnormal findings: Secondary | ICD-10-CM | POA: Diagnosis not present

## 2018-12-05 DIAGNOSIS — M19011 Primary osteoarthritis, right shoulder: Secondary | ICD-10-CM | POA: Diagnosis not present

## 2018-12-05 DIAGNOSIS — R7303 Prediabetes: Secondary | ICD-10-CM | POA: Diagnosis not present

## 2018-12-05 DIAGNOSIS — K760 Fatty (change of) liver, not elsewhere classified: Secondary | ICD-10-CM | POA: Diagnosis not present

## 2018-12-05 DIAGNOSIS — E039 Hypothyroidism, unspecified: Secondary | ICD-10-CM | POA: Diagnosis not present

## 2018-12-05 DIAGNOSIS — R059 Cough, unspecified: Secondary | ICD-10-CM

## 2018-12-05 DIAGNOSIS — E782 Mixed hyperlipidemia: Secondary | ICD-10-CM

## 2018-12-05 LAB — CBC WITH DIFFERENTIAL/PLATELET
Basophils Absolute: 0 10*3/uL (ref 0.0–0.1)
Basophils Relative: 0.9 % (ref 0.0–3.0)
Eosinophils Absolute: 0.1 10*3/uL (ref 0.0–0.7)
Eosinophils Relative: 2.5 % (ref 0.0–5.0)
HCT: 40.7 % (ref 36.0–46.0)
Hemoglobin: 14 g/dL (ref 12.0–15.0)
Lymphocytes Relative: 22.7 % (ref 12.0–46.0)
Lymphs Abs: 1.2 10*3/uL (ref 0.7–4.0)
MCHC: 34.3 g/dL (ref 30.0–36.0)
MCV: 92.2 fl (ref 78.0–100.0)
Monocytes Absolute: 0.4 10*3/uL (ref 0.1–1.0)
Monocytes Relative: 8.2 % (ref 3.0–12.0)
Neutro Abs: 3.5 10*3/uL (ref 1.4–7.7)
Neutrophils Relative %: 65.7 % (ref 43.0–77.0)
Platelets: 216 10*3/uL (ref 150.0–400.0)
RBC: 4.42 Mil/uL (ref 3.87–5.11)
RDW: 13.1 % (ref 11.5–15.5)
WBC: 5.3 10*3/uL (ref 4.0–10.5)

## 2018-12-05 LAB — COMPREHENSIVE METABOLIC PANEL
ALT: 12 U/L (ref 0–35)
AST: 10 U/L (ref 0–37)
Albumin: 4.3 g/dL (ref 3.5–5.2)
Alkaline Phosphatase: 66 U/L (ref 39–117)
BUN: 22 mg/dL (ref 6–23)
CO2: 28 mEq/L (ref 19–32)
Calcium: 9.1 mg/dL (ref 8.4–10.5)
Chloride: 105 mEq/L (ref 96–112)
Creatinine, Ser: 0.88 mg/dL (ref 0.40–1.20)
GFR: 62.15 mL/min (ref >60.00)
Glucose, Bld: 122 mg/dL — ABNORMAL HIGH (ref 70–99)
Potassium: 4 mEq/L (ref 3.5–5.1)
Sodium: 141 mEq/L (ref 135–145)
Total Bilirubin: 1.5 mg/dL — ABNORMAL HIGH (ref 0.2–1.2)
Total Protein: 6.5 g/dL (ref 6.0–8.3)

## 2018-12-05 LAB — VITAMIN D 25 HYDROXY (VIT D DEFICIENCY, FRACTURES): VITD: 41.85 ng/mL (ref 30.00–100.00)

## 2018-12-05 LAB — LIPID PANEL
Cholesterol: 148 mg/dL (ref 0–200)
HDL: 71.6 mg/dL (ref >39.00)
LDL Cholesterol: 58 mg/dL (ref 0–99)
NonHDL: 76.58
Total CHOL/HDL Ratio: 2
Triglycerides: 94 mg/dL (ref 0.0–149.0)
VLDL: 18.8 mg/dL (ref 0.0–40.0)

## 2018-12-05 LAB — HEMOGLOBIN A1C: Hgb A1c MFr Bld: 6.2 % (ref 4.6–6.5)

## 2018-12-05 LAB — TSH: TSH: 2.4 u[IU]/mL (ref 0.35–4.50)

## 2018-12-05 LAB — VITAMIN B12: Vitamin B-12: 1034 pg/mL — ABNORMAL HIGH (ref 211–911)

## 2018-12-06 DIAGNOSIS — M545 Low back pain: Secondary | ICD-10-CM | POA: Diagnosis not present

## 2018-12-06 DIAGNOSIS — M47817 Spondylosis without myelopathy or radiculopathy, lumbosacral region: Secondary | ICD-10-CM | POA: Insufficient documentation

## 2018-12-06 LAB — HEPATITIS C ANTIBODY
Hepatitis C Ab: NONREACTIVE
SIGNAL TO CUT-OFF: 0.01 (ref <1.00)

## 2018-12-06 LAB — HEPATITIS B SURFACE ANTIGEN: Hepatitis B Surface Ag: NONREACTIVE

## 2018-12-06 LAB — HEPATITIS B SURFACE ANTIBODY, QUANTITATIVE: Hep B S AB Quant (Post): <5 m[IU]/mL — ABNORMAL LOW (ref >=10)

## 2018-12-06 LAB — HEPATITIS A ANTIBODY, TOTAL: Hepatitis A AB,Total: REACTIVE — AB

## 2018-12-07 ENCOUNTER — Encounter: Payer: Self-pay | Admitting: Internal Medicine

## 2018-12-11 ENCOUNTER — Telehealth: Payer: Self-pay | Admitting: *Deleted

## 2018-12-11 NOTE — Telephone Encounter (Signed)
Copied from [place] [id number]. Topic: General - Inquiry >> Dec 07, 2018  5:07 PM Rutherford Nail, NT wrote: Reason for CRM: Patient calling and would like to know if Dr Aundra Dubin could call in a medication for her anxiety that she is having? Please advise. [id number] [address], [place]  [phone]

## 2018-12-11 NOTE — Telephone Encounter (Signed)
Does she want Zoloft? This is what she was on in the past  [place]

## 2018-12-11 NOTE — Telephone Encounter (Signed)
Unable to leave message for patient to return call back. [facility] may obtain information.

## 2018-12-19 DIAGNOSIS — Z85820 Personal history of malignant melanoma of skin: Secondary | ICD-10-CM | POA: Diagnosis not present

## 2018-12-19 DIAGNOSIS — L570 Actinic keratosis: Secondary | ICD-10-CM | POA: Diagnosis not present

## 2018-12-19 DIAGNOSIS — L565 Disseminated superficial actinic porokeratosis (DSAP): Secondary | ICD-10-CM | POA: Diagnosis not present

## 2018-12-19 DIAGNOSIS — L821 Other seborrheic keratosis: Secondary | ICD-10-CM | POA: Diagnosis not present

## 2018-12-19 DIAGNOSIS — Z808 Family history of malignant neoplasm of other organs or systems: Secondary | ICD-10-CM | POA: Diagnosis not present

## 2018-12-19 DIAGNOSIS — Z87898 Personal history of other specified conditions: Secondary | ICD-10-CM | POA: Diagnosis not present

## 2018-12-19 DIAGNOSIS — Z85828 Personal history of other malignant neoplasm of skin: Secondary | ICD-10-CM | POA: Diagnosis not present

## 2018-12-20 DIAGNOSIS — M25611 Stiffness of right shoulder, not elsewhere classified: Secondary | ICD-10-CM | POA: Diagnosis not present

## 2018-12-20 DIAGNOSIS — M25511 Pain in right shoulder: Secondary | ICD-10-CM | POA: Diagnosis not present

## 2018-12-21 ENCOUNTER — Telehealth: Payer: Self-pay | Admitting: Pulmonary Disease

## 2018-12-21 NOTE — Telephone Encounter (Signed)
Called pt to confirm 12/24/2018 appt, screen and give directions.

## 2018-12-24 ENCOUNTER — Institutional Professional Consult (permissible substitution): Payer: PPO | Admitting: Pulmonary Disease

## 2018-12-27 NOTE — Telephone Encounter (Signed)
Called pt to confirm 12/28/2018 appt, screen and give directions.

## 2018-12-28 ENCOUNTER — Other Ambulatory Visit: Payer: Self-pay

## 2018-12-28 ENCOUNTER — Ambulatory Visit: Payer: PPO | Admitting: Pulmonary Disease

## 2018-12-28 ENCOUNTER — Encounter: Payer: Self-pay | Admitting: Pulmonary Disease

## 2018-12-28 VITALS — BP 122/72 | HR 87 | Temp 97.9°F | Ht 65.0 in | Wt 185.0 lb

## 2018-12-28 DIAGNOSIS — Z85820 Personal history of malignant melanoma of skin: Secondary | ICD-10-CM

## 2018-12-28 DIAGNOSIS — R0602 Shortness of breath: Secondary | ICD-10-CM

## 2018-12-28 DIAGNOSIS — R05 Cough: Secondary | ICD-10-CM

## 2018-12-28 DIAGNOSIS — Z9889 Other specified postprocedural states: Secondary | ICD-10-CM | POA: Diagnosis not present

## 2018-12-28 DIAGNOSIS — R059 Cough, unspecified: Secondary | ICD-10-CM

## 2018-12-28 MED ORDER — OMEPRAZOLE 20 MG PO CPDR: 20.0000 mg | DELAYED_RELEASE_CAPSULE | Freq: Two times a day (BID) | ORAL | 3 refills | Status: DC

## 2018-12-28 NOTE — Progress Notes (Signed)
Subjective:    Patient ID: [name], female    DOB: 1940/11/02, 78 y.o.   MRN: [id number]  HPI This is a 78 year old lifelong never smoker who presents for evaluation of cough since December 2019.  She is kindly referred by Dr. Aundra Dubin- she states that her issues started with an upper respiratory infection.  She had 3 rounds of antibiotics and her cough persisted.  She is very vague with regards to her history she does state that her cough is getting better.  He states that the cough at that time sounded like she had "whooping" cough.  She does not note any particular triggers of the cough except perhaps going outside which will sometimes trigger it.  She does not associated with meals per se.  She has had dyspnea on exertion for several years.  Again she cannot pinpoint how long.  It is that she attributed the dyspnea to increase weight and inactivity.  She has not had this investigated.  Recently she was given a prescription for pro-air which she feels may help her at times at other times not.  He also complains of upper thoracic left-sided back pain that occasionally feels like a "toothache".  She has not had any fevers, chills or sweats.  No chest pain.  No orthopnea or paroxysmal nocturnal dyspnea.  No lower extremity edema.  He also states that he recently she had mold cleared out of her home.  I have reviewed her past chest imaging most recent chest x-ray was from March 2020 and showed no acute disease.  A prior CT scan of 2017 showed hiatal hernia and evidence of esophageal reflux.  No other abnormalities.  Patient past medical history, surgical history, family history and social history have been reviewed.  Most salient features are that patient had stage I A melanoma diagnosed in 2012.  She had wide excision and sentinel node biopsy from the left axilla by Dr. Autumn Messing.  She follows with Dr. Lorna Few afterwards.  She followed for this issue for approximately 5 years and has not  had any recurrence noted.  Was released from oncology follow-up in 2017.  She is a lifelong never smoker.    Review of Systems  Constitutional: Negative.   HENT: Negative.   Respiratory: Positive for cough (Almost resolved by now) and shortness of breath.   Cardiovascular: Negative.   Gastrointestinal:       Gastroesophageal reflux symptoms  Endocrine: Negative.   Genitourinary: Positive for frequency and urgency.  Musculoskeletal: Positive for back pain (Upper thoracic left-sided).  Skin: Negative.   Neurological: Negative.   Hematological: Negative.   Psychiatric/Behavioral: The patient is nervous/anxious.   All other systems reviewed and are negative.      Objective:   Physical Exam Constitutional:      General: She is not in acute distress.    Appearance: Normal appearance. She is overweight. She is not ill-appearing.  HENT:     Head: Normocephalic and atraumatic.     Comments: Nose/mouth/throat deferred at present due to masking needs for COVID-19    Right Ear: External ear normal.     Left Ear: External ear normal.  Eyes:     General: No scleral icterus.    Conjunctiva/sclera: Conjunctivae normal.     Pupils: Pupils are equal, round, and reactive to light.  Neck:     Musculoskeletal: Neck supple.     Thyroid: No thyromegaly.     Trachea: Trachea and phonation normal.  Cardiovascular:  Rate and Rhythm: Normal rate and regular rhythm.     Pulses: Normal pulses.     Heart sounds: Normal heart sounds. No murmur.  Pulmonary:     Effort: Pulmonary effort is normal.     Breath sounds: Normal breath sounds. No wheezing.  Chest:     Chest wall: No tenderness.  Abdominal:     General: There is no distension.  Musculoskeletal: Normal range of motion.     Thoracic back: She exhibits tenderness.     Right lower leg: No edema.     Left lower leg: No edema.     Comments: Upper thoracic left-sided, pinpoint.  Skin:    General: Skin is warm and dry.  Neurological:      General: No focal deficit present.     Mental Status: She is alert and oriented to person, place, and time.  Psychiatric:        Mood and Affect: Mood normal.        Behavior: Behavior normal.    Available imaging data has been reviewed independently as noted above.       Assessment & Plan:    1.  Cough: This is likely multifactorial.  It appears that she had a post infectious cough which started in December.  These issues usually occur after a viral illness.  Can last anywhere between 8 to 12 weeks.  It can be persistent.  Additionally this can sensitized the larynx and create a laryngeal sensory neuropathy which may prolong the course.  Appears that it is now resolving for the patient.  Patient does have evidence of hiatal hernia and esophageal reflux as noted by prior imaging.  This may also perpetuate the symptoms.  Not on ACE inhibitors or any other medication that may aggravate cough.  Presently recommend maximizing treatment for gastroesophageal reflux and see if this abolishes the symptom altogether.  She has had issues with prior melanoma and it would be prudent to evaluate this as to potential structural issue (metastatic) causing cough will obtain CT scan of the chest.    2.  Dyspnea: She has had this symptom for several years mostly on exertion.  She has never had formal pulmonary function testing.  Will schedule pulmonary function testing to evaluate this.  3.  History of stage I A melanoma status post wide excision: This issue adds complexity to her management.  4.  Upper thoracic left-sided back pain: This appears to be at a muscular insertion on this area.  I have recommended that she speak with her primary care physician in this regard.  For now conservative measures wth over-the-counter analgesics and warm compresses.  We will see the patient in follow-up in 4 to 6 weeks time she is to contact us prior to that time should any new difficulties arise.  Thank you for  allowing me to participate in this patient's care.  This chart was dictated using voice recognition software/Dragon.  Despite best efforts to proofread, errors can occur which can change the meaning.  Any change was purely unintentional.

## 2018-12-28 NOTE — Patient Instructions (Signed)
1.  Will increase her Prilosec to twice a day.  2.  We will obtain a chest CT to evaluate your cough issues.  3.  We will obtain breathing test to evaluate your shortness of breath.  4.  We will see you in follow-up in 4 to 6 weeks time contact us prior to that time should any new difficulties arise.

## 2019-01-01 DIAGNOSIS — N3281 Overactive bladder: Secondary | ICD-10-CM | POA: Diagnosis not present

## 2019-01-01 DIAGNOSIS — N3941 Urge incontinence: Secondary | ICD-10-CM | POA: Diagnosis not present

## 2019-01-15 DIAGNOSIS — Z803 Family history of malignant neoplasm of breast: Secondary | ICD-10-CM | POA: Diagnosis not present

## 2019-01-15 DIAGNOSIS — Z1231 Encounter for screening mammogram for malignant neoplasm of breast: Secondary | ICD-10-CM | POA: Diagnosis not present

## 2019-01-15 LAB — HM MAMMOGRAPHY

## 2019-01-17 ENCOUNTER — Encounter: Payer: Self-pay | Admitting: Internal Medicine

## 2019-01-21 ENCOUNTER — Ambulatory Visit
Admission: RE | Admit: 2019-01-21 | Discharge: 2019-01-21 | Disposition: A | Discharge status: Home / Self Care | Payer: PPO | Source: Ambulatory Visit | Attending: Pulmonary Disease | Admitting: Pulmonary Disease

## 2019-01-21 ENCOUNTER — Other Ambulatory Visit: Payer: Self-pay

## 2019-01-21 DIAGNOSIS — R05 Cough: Secondary | ICD-10-CM | POA: Insufficient documentation

## 2019-01-21 DIAGNOSIS — Z85820 Personal history of malignant melanoma of skin: Secondary | ICD-10-CM | POA: Diagnosis not present

## 2019-01-21 DIAGNOSIS — K449 Diaphragmatic hernia without obstruction or gangrene: Secondary | ICD-10-CM | POA: Diagnosis not present

## 2019-01-21 DIAGNOSIS — R059 Cough, unspecified: Secondary | ICD-10-CM

## 2019-01-21 DIAGNOSIS — R0602 Shortness of breath: Secondary | ICD-10-CM | POA: Diagnosis not present

## 2019-01-23 ENCOUNTER — Telehealth: Payer: Self-pay | Admitting: Pulmonary Disease

## 2019-01-23 NOTE — Telephone Encounter (Signed)
Dr. Patsey Berthold is actually out of the office so pt will have to reschedule for another day, reccommended after PFT on 12/24/2018

## 2019-01-23 NOTE — Telephone Encounter (Signed)
Called pt about appt. 01/24/2019. If pt does not have an urgent need to be seen, it is recommended that she reschedule for after PFT on 12/24/2018 as this appt. Is a follow up for those results.

## 2019-01-24 ENCOUNTER — Ambulatory Visit: Payer: PPO | Admitting: Pulmonary Disease

## 2019-01-25 ENCOUNTER — Other Ambulatory Visit: Payer: Self-pay

## 2019-01-25 ENCOUNTER — Ambulatory Visit: Payer: PPO | Admitting: Pulmonary Disease

## 2019-01-25 ENCOUNTER — Encounter: Payer: Self-pay | Admitting: Pulmonary Disease

## 2019-01-25 VITALS — BP 130/78 | HR 74 | Temp 98.4°F | Ht 66.0 in | Wt 186.8 lb

## 2019-01-25 DIAGNOSIS — R0602 Shortness of breath: Secondary | ICD-10-CM | POA: Diagnosis not present

## 2019-01-25 DIAGNOSIS — Z9889 Other specified postprocedural states: Secondary | ICD-10-CM

## 2019-01-25 DIAGNOSIS — R05 Cough: Secondary | ICD-10-CM

## 2019-01-25 DIAGNOSIS — Z85820 Personal history of malignant melanoma of skin: Secondary | ICD-10-CM | POA: Diagnosis not present

## 2019-01-25 DIAGNOSIS — K2100 Gastro-esophageal reflux disease with esophagitis, without bleeding: Secondary | ICD-10-CM

## 2019-01-25 DIAGNOSIS — R059 Cough, unspecified: Secondary | ICD-10-CM

## 2019-01-25 NOTE — Patient Instructions (Signed)
1.  Continue omeprazole twice a day as you are doing.  You do have a hiatal hernia and evidence of reflux on the CT scan you recently had.  2.  Follow-up with your breathing tests.  3.  We will see you in follow-up in 3 months time.  Call sooner should any new difficulties arise.  4.  He can make an appointment with Dr. Rockey Situ as he leaves.  You check out.

## 2019-01-26 NOTE — Progress Notes (Signed)
Cardiology Office Note  Date:  01/29/2019   ID:  [name], DOB 1940-09-30, MRN [id number]  PCP:  McLean-Scocuzza, Nino Glow, MD   Chief Complaint  Patient presents with  . Other    Patient c/o SOB and swelling in ankles. Meds reviewed verbally with patient.     HPI:  [name] is a 78 y.o. Female with hx of  CAD and aortic athero on CT scan  lightheadedness,  Mild carotid dz Hyperlipidemia morbid obesity Syncope S/p right TKR Who presents for  coronary disease, edema  Hx of  leg swelling Started lasix one month,  Swelling better, then stopped Uncertain if lasix helped with breathing  Mold in the house, "caused SOB?  Echo in 2017: normal EF, normal RVSP  CT chest 01/2019 Aortic Atherosclerosis  Diffuse coronary artery calcifications, particularly dense within the left anterior descending coronary artery Small hiatal hernia mild thickening of the walls of the lower esophagus/hiatal hernia  S/p right TKR  Very deconditioned, walks short distance, gets SOB "Coughing since dec"  Nonsmoker No diabetes  EKG personally reviewed by myself on todays visit NSR rate 63 bpm no ST or T wave changes  Other past medical reviewed CT ABD: mild A athero There is 3 vessel coronary calcification  2017 and 2018  Surgery for TKR left scheduled in the near future On anxiety pill  Previous imaging and studies below reviewed with her in detail on today's visit Stress test 04/2016: no ischemia EF >55%  Carotid u/s <39% b/l  CT chest 12/2016 Very mild Aortic and branch vessel atherosclerosis. Mild cardiomegaly, without pericardial effusion. LAD coronary artery atherosclerosis.   PMH:   has a past medical history of Acid reflux, Allergy, Anemia, Anxiety, Arthritis, Atypical moles, Bronchitis, Bronchospasm, CAD (coronary artery disease), Candida esophagitis ([place]), Carotid artery calcification, bilateral (04/2016), Cataract, Cervical stenosis of spine, Chronic insomnia,  Complication of anesthesia, COPD (chronic obstructive pulmonary disease) ([place]), Depression, Diverticulitis, Diverticulosis, Dizziness, Elevated liver enzymes, Family history of adverse reaction to anesthesia, Fatigue, Fatty liver, Generalized headache, GERD (gastroesophageal reflux disease), History of chemotherapy, History of kidney stones, History of neuropathy, History of vertigo, migraines, Hyperlipidemia, Hypothyroidism, Insomnia, Irritable bowel syndrome, LLQ abdominal pain, Melanoma ([place]) (2013), Melanoma ([place]), Neuropathy, peripheral, Peripheral neuropathy, Pneumonia, PONV (postoperative nausea and vomiting), Pre-diabetes, Retina disorder, SCC (squamous cell carcinoma), Stroke ([place]), Thyroid disease, Urine incontinence, Vitamin D deficiency, and Weight gain.  PSH:    Past Surgical History:  Procedure Laterality Date  . BILATERAL SALPINGOOPHORECTOMY     ovarian cyst Dr. Ouida Sills and Dr. Alisia Ferrari 2008  . BREAST SURGERY     in 2014 bx   . BREAST SURGERY     augmentation with saline Dr. Hubbard Hartshorn 1995   . CHOLECYSTECTOMY N/A 01/28/2015   Procedure: LAPAROSCOPIC CHOLECYSTECTOMY WITH INTRAOPERATIVE CHOLANGIOGRAM;  Surgeon: Autumn Messing III, MD;  Location: [facility];  Service: General;  Laterality: N/A;  . CLOSED MANIPULATION SHOULDER     rt  . COLONOSCOPY WITH PROPOFOL N/A 11/20/2012   Procedure: COLONOSCOPY WITH PROPOFOL;  Surgeon: Garlan Fair, MD;  Location: [facility];  Service: Endoscopy;  Laterality: N/A;  . colonscopy     May 2014  . JOINT REPLACEMENT     RTK  . KNEE ARTHROSCOPY    . left knee miniscus tear repair    . MELANOMA EXCISION WITH SENTINEL LYMPH NODE BIOPSY  03/18/11   Back, nodes neg  . OOPHORECTOMY    . ROTATOR CUFF REPAIR     left  .  SHOULDER SURGERY     right and left   . TOTAL HIP ARTHROPLASTY    . TOTAL KNEE ARTHROPLASTY     right 2010 and left in 2018   . TOTAL KNEE ARTHROPLASTY Left 05/16/2017   Procedure: LEFT TOTAL KNEE ARTHROPLASTY;  Surgeon: Paralee Cancel, MD;  Location: [facility];  Service: Orthopedics;  Laterality: Left;  70 mins  . TUBAL LIGATION    . TUMOR REMOVAL     ovary    Current Outpatient Medications  Medication Sig Dispense Refill  . albuterol (PROAIR HFA) 108 (90 Base) MCG/ACT inhaler ProAir HFA 90 mcg/actuation aerosol inhaler  INHALE 1 2 PUFFS INTO THE LUNGS EVERY 6 (SIX) HOURS AS NEEDED FOR WHEEZING OR SHORTNESS OF BREATH.    Marland Kitchen Apoaequorin (PREVAGEN) 10 MG CAPS Take by mouth.    . Ascorbic Acid (VITAMIN C) 1000 MG tablet Take 1,000 mg by mouth daily.    Marland Kitchen aspirin 81 MG tablet TAKE 1 TABLET BY MOUTH EVERY DAY    . atorvastatin (LIPITOR) 10 MG tablet Take 10 mg daily by mouth.     . Calcium 600-200 MG-UNIT tablet Take 1 tablet daily by mouth.    . Cholecalciferol (VITAMIN D3) 2000 UNITS capsule Take 2,000 Units by mouth daily.    . Coenzyme Q10 (COQ10) 30 MG CAPS Take by mouth.    . diclofenac sodium (VOLTAREN) 1 % GEL Voltaren 1 % topical gel  APPLY 4 GRAMS TO THE AFFECTED AREA(S) BY TOPICAL ROUTE 4 TIMES PER DAY    . levothyroxine (SYNTHROID) 50 MCG tablet Take 1 tablet (50 mcg total) by mouth daily before breakfast. 30 tablet 1  . mirabegron ER (MYRBETRIQ) 25 MG TB24 tablet Take 1 tablet (25 mg total) by mouth daily. 30 tablet   . Multiple Vitamins-Minerals (PRESERVISION AREDS 2 PO) Take 1 capsule by mouth daily.    . Omega-3 Fatty Acids (FISH OIL) 1000 MG CAPS Take 1,000 mg by mouth daily.     Marland Kitchen omeprazole (PRILOSEC) 20 MG capsule Take 1 capsule (20 mg total) by mouth 2 (two) times daily before a meal. 60 capsule 3  . vitamin B-12 (CYANOCOBALAMIN) 1000 MCG tablet Take 2,000 mcg daily by mouth. 2 tabs    . furosemide (LASIX) 20 MG tablet Take 1 tablet (20 mg total) by mouth daily. In am for leg swelling (Patient not taking: Reported on 01/29/2019) 30 tablet 5   No current facility-administered medications for this visit.      Allergies:   Epinephrine, Penicillins, Lidocaine hcl, Other, and Valium [diazepam]    Social History:  The patient  reports that she has never smoked. She has never used smokeless tobacco. She reports current alcohol use of about 1.0 standard drinks of alcohol per week. She reports that she does not use drugs.   Family History:   family history includes Alcohol abuse in her son; Alzheimer's disease in her paternal aunt, paternal aunt, and paternal aunt; Arthritis in her sister and sister; COPD in her sister; Cancer in her maternal grandfather, sister, and another family member; Colon cancer in her maternal grandfather; Colon polyps in her sister; Depression in her son; Diabetes in her father, sister, and son; Early death in her father, maternal grandfather, and paternal grandmother; Heart disease in her daughter, father, sister, sister, and son; Hyperlipidemia in her daughter, father, mother, sister, sister, and son; Hypertension in her daughter, father, mother, sister, and sister; Pancreatic cancer in her maternal uncle; Stroke in her maternal grandmother, mother, and  paternal grandmother.    Review of Systems: Review of Systems  Constitutional: Negative.   HENT: Negative.   Respiratory: Positive for shortness of breath.   Cardiovascular: Negative.   Gastrointestinal: Negative.   Musculoskeletal: Negative.   Neurological: Negative.   Psychiatric/Behavioral: Negative.   All other systems reviewed and are negative.   PHYSICAL EXAM: VS:  BP (!) 140/46 (BP Location: Left Arm, Patient Position: Sitting, Cuff Size: Normal)   Pulse 63   Ht 5\' 6"  (1.676 m)   Wt 186 lb (84.4 kg)   BMI 30.02 kg/m  , BMI Body mass index is 30.02 kg/m. GEN: Well nourished, well developed, in no acute distress , obese HEENT: normal  Neck: no JVD, carotid bruits, or masses Cardiac: RRR; no murmurs, rubs, or gallops,trace LE  edema  Respiratory:  clear to auscultation bilaterally, normal work of breathing GI: soft, nontender, nondistended, + BS MS: no deformity or atrophy  Skin: warm and dry,  no rash Neuro:  Strength and sensation are intact Psych: euthymic mood, full affect   Recent Labs: 10/02/2018: B Natriuretic Peptide 73.0 12/05/2018: ALT 12; BUN 22; Creatinine, Ser 0.88; Hemoglobin 14.0; Platelets 216.0; Potassium 4.0; Sodium 141; TSH 2.40    Lipid Panel Lab Results  Component Value Date   CHOL 148 12/05/2018   HDL 71.60 12/05/2018   LDLCALC 58 12/05/2018   TRIG 94.0 12/05/2018      Wt Readings from Last 3 Encounters:  01/29/19 186 lb (84.4 kg)  01/25/19 186 lb 12.8 oz (84.7 kg)  12/28/18 185 lb (83.9 kg)       ASSESSMENT AND PLAN:  Aortic atherosclerosis (HCC) Seen on CT, mild On statin  Coronary artery calcification seen on CAT scan Scan in 2017 and 2018 Goal LDL <70   Mixed hyperlipidemia She will stay on lipitor Goal LDL <70  Chest tightness Long discussion concerning her chest pain symptoms atypical, but has coronary calcification Recommended she start walking problem  SOB: Deconditioned Happened for years Lasix as needed, has overactive bladder  COVID-19 Education: The signs and symptoms of COVID-19 were discussed with the patient and how to seek care for testing (follow up with PCP or arrange E-visit).  The importance of social distancing was discussed today. Disposition:   F/U  12 months as needed   Total encounter time more than 25 minutes  Greater than 50% was spent in counseling and coordination of care with the patient    Orders Placed This Encounter  Procedures  . EKG 12-Lead     Signed, Esmond Plants, M.D., Ph.D. 01/29/2019  [place], [place]

## 2019-01-28 ENCOUNTER — Encounter: Payer: Self-pay | Admitting: Pulmonary Disease

## 2019-01-28 NOTE — Progress Notes (Signed)
Subjective:    Patient ID: [name], female    DOB: [birth year], 78 y.o.   MRN: [id number]  HPI Patient is a 78 year old lifelong never smoker, who presents for follow-up of cough that had been present since December 2019.  At her initial visit on 28 December 2018 we had instructed her to wean increase her omeprazole to 20 mg twice a day.  Since she has done this, she has noted that her cough is better.  She had chest CT performed on 6 July that showed no pulmonary parenchymal issues.  She has evidence of hiatal hernia and thickening of the esophagus consistent with esophagitis.  Suspect that her cough is likely initially triggered by postinfectious cough then perpetuated by gastroesophageal reflux with laryngopharyngeal component.  She does not endorse any fevers, chills or sweats.  She states that she has felt markedly better.  She has noted some issues with increasing dyspnea and lower extremity edema.  She is to follow-up with Dr. Rockey Situ for evaluation of this issue.  She has not had pulmonary function testing as of yet but I do not anticipate that this is going to show much in the way of obstructive lung disease.  The testing has been delayed due to the COVID-19 pandemic.   The patient CT scan was independently reviewed I also reviewed the scan with the patient.  Review of Systems  Constitutional: Positive for fatigue.  HENT: Negative.   Eyes: Negative.   Respiratory: Positive for shortness of breath.   Cardiovascular: Positive for leg swelling.  Gastrointestinal:       Gastroesophageal reflux symptoms markedly improved  Endocrine: Negative.   Genitourinary: Negative.   Musculoskeletal: Negative.   Allergic/Immunologic: Negative.   Neurological: Negative.   Psychiatric/Behavioral: Negative.   All other systems reviewed and are negative.      Objective:   Physical Exam Constitutional:      General: She is not in acute distress.    Appearance: Normal appearance. She is  overweight. She is not ill-appearing.  HENT:     Head: Normocephalic and atraumatic.     Comments: Nose/mouth/throat deferred at present due to masking needs for COVID-19    Right Ear: External ear normal.     Left Ear: External ear normal.     Nose:     Comments: Nose/mouth/throat not examined due to masking requirements for COVID 19. Eyes:     General: No scleral icterus.    Conjunctiva/sclera: Conjunctivae normal.     Pupils: Pupils are equal, round, and reactive to light.  Neck:     Musculoskeletal: Neck supple.     Thyroid: No thyromegaly.     Trachea: Trachea and phonation normal.  Cardiovascular:     Rate and Rhythm: Normal rate and regular rhythm.     Pulses: Normal pulses.     Heart sounds: Normal heart sounds. No murmur.  Pulmonary:     Effort: Pulmonary effort is normal.     Breath sounds: Normal breath sounds. No wheezing.  Chest:     Chest wall: No tenderness.  Abdominal:     General: There is no distension.  Musculoskeletal:     Right lower leg: 1+ Pitting Edema present.     Left lower leg: 1+ Pitting Edema present.  Skin:    General: Skin is warm and dry.  Neurological:     General: No focal deficit present.     Mental Status: She is alert and oriented to person, place, and time.  Psychiatric:        Mood and Affect: Mood normal.        Behavior: Behavior normal.    Representative image of the patient's CT scan of the chest noted below.  It shows a sizable hiatal hernia:       Assessment & Plan:   1.  Cough: The issue appears to have initially started as a postinfectious cough, perpetuated by gastroesophageal reflux.  She does have a hiatal hernia and evidence of esophagitis on CT.  She is responding to increasing PPI twice a day.  This symptom has not been much of an issue over the last week.  Continue PPI twice a day for now.  2.  Dyspnea with lower extremity edema: She has had this symptom for several years mostly on exertion.  Pulmonary function  testing is pending however given normal appearance of the lungs on CT and a normal lung exam and a history of having never smoked I do not suspect that this symptom is due to pulmonary disease.  She does have evidence of diastolic dysfunction on prior echo, she is to be reevaluated by Dr. Rockey Situ in this regard.  Suspect that this is likely a cardiac issue.  3.  Gastroesophageal reflux disease with esophagitis: Continue PPI twice a day.  4.  History of stage I a melanoma status post wide excision in the past: No evidence of recurrence of this disease.  This issue adds complexity to her management.    We will see the patient in follow-up in 3 months time she is to contact us prior to that time should any new difficulties arise.    This chart was dictated using voice recognition software/Dragon.  Despite best efforts to proofread, errors can occur which can change the meaning.  Any change was purely unintentional.

## 2019-01-29 ENCOUNTER — Other Ambulatory Visit: Payer: Self-pay

## 2019-01-29 ENCOUNTER — Encounter (INDEPENDENT_AMBULATORY_CARE_PROVIDER_SITE_OTHER): Payer: Self-pay

## 2019-01-29 ENCOUNTER — Ambulatory Visit (INDEPENDENT_AMBULATORY_CARE_PROVIDER_SITE_OTHER): Payer: PPO | Admitting: Cardiovascular Disease

## 2019-01-29 ENCOUNTER — Encounter: Payer: Self-pay | Admitting: Cardiovascular Disease

## 2019-01-29 VITALS — BP 140/46 | HR 63 | Ht 66.0 in | Wt 186.0 lb

## 2019-01-29 DIAGNOSIS — E782 Mixed hyperlipidemia: Secondary | ICD-10-CM | POA: Diagnosis not present

## 2019-01-29 DIAGNOSIS — I700 Atherosclerosis of aorta: Secondary | ICD-10-CM

## 2019-01-29 DIAGNOSIS — I739 Peripheral vascular disease, unspecified: Secondary | ICD-10-CM

## 2019-01-29 DIAGNOSIS — R55 Syncope and collapse: Secondary | ICD-10-CM | POA: Diagnosis not present

## 2019-01-29 DIAGNOSIS — I2510 Atherosclerotic heart disease of native coronary artery without angina pectoris: Secondary | ICD-10-CM

## 2019-01-29 NOTE — Patient Instructions (Addendum)

## 2019-01-31 ENCOUNTER — Telehealth: Payer: Self-pay | Admitting: Pulmonary Disease

## 2019-01-31 NOTE — Telephone Encounter (Signed)
Will route to LG as an [place]

## 2019-01-31 NOTE — Telephone Encounter (Signed)
Pt called to cancel PFT on 02/21/2019 and stated that she has been having problems with her eyes and she has an appointment with a Retina Specialist on 02/21/2019 . Asked patient if she wanted to R/S and she stated that she would if Dr. Patsey Berthold thought that she still needed to have this test, however, when patient saw Dr. Rockey Situ, per patient, he advised patient that he didn't think she needed this test. Asked patient if she still had SOB and she stated that she was, but that her heart was ok per Dr. Rockey Situ,  Message taken to advise Dr. Patsey Berthold of PFT cancellation and patient stated that if Dr. Patsey Berthold still felt like she needed to have this test done, that she would R/S.  Please advise. Rhonda J Cobb

## 2019-02-05 NOTE — Telephone Encounter (Signed)
We can reassess need for PFT on return appt.

## 2019-02-05 NOTE — Telephone Encounter (Signed)
Called and spoke to patient. She is aware will reassess at f/u apt. Nothing further needed at this time.

## 2019-02-08 ENCOUNTER — Other Ambulatory Visit: Payer: Self-pay

## 2019-02-08 DIAGNOSIS — Z20822 Contact with and (suspected) exposure to covid-19: Secondary | ICD-10-CM

## 2019-02-11 LAB — NOVEL CORONAVIRUS, NAA: SARS-CoV-2, NAA: NOT DETECTED

## 2019-02-19 ENCOUNTER — Ambulatory Visit (INDEPENDENT_AMBULATORY_CARE_PROVIDER_SITE_OTHER): Payer: PPO | Admitting: Internal Medicine

## 2019-02-19 ENCOUNTER — Encounter: Payer: Self-pay | Admitting: Internal Medicine

## 2019-02-19 ENCOUNTER — Other Ambulatory Visit: Payer: Self-pay

## 2019-02-19 VITALS — Ht 64.0 in | Wt 188.0 lb

## 2019-02-19 DIAGNOSIS — F339 Major depressive disorder, recurrent, unspecified: Secondary | ICD-10-CM | POA: Diagnosis not present

## 2019-02-19 DIAGNOSIS — Z1211 Encounter for screening for malignant neoplasm of colon: Secondary | ICD-10-CM

## 2019-02-19 DIAGNOSIS — R232 Flushing: Secondary | ICD-10-CM

## 2019-02-19 DIAGNOSIS — R05 Cough: Secondary | ICD-10-CM | POA: Diagnosis not present

## 2019-02-19 DIAGNOSIS — K580 Irritable bowel syndrome with diarrhea: Secondary | ICD-10-CM | POA: Insufficient documentation

## 2019-02-19 DIAGNOSIS — R059 Cough, unspecified: Secondary | ICD-10-CM | POA: Insufficient documentation

## 2019-02-19 DIAGNOSIS — R053 Chronic cough: Secondary | ICD-10-CM

## 2019-02-19 MED ORDER — SERTRALINE HCL 25 MG PO TABS: 25.0000 mg | ORAL_TABLET | Freq: Every day | ORAL | 3 refills | Status: DC

## 2019-02-19 NOTE — Patient Instructions (Signed)
Rosacea Rosacea is a long-term (chronic) condition that affects the skin of the face, including the cheeks, nose, forehead, and chin. This condition can also affect the eyes. Rosacea causes blood vessels near the surface of the skin to enlarge, which results in redness. What are the causes? The cause of this condition is not known. Certain triggers can make rosacea worse, including:  Hot baths.  Exercise.  Sunlight.  Very hot or cold temperatures.  Hot or spicy foods and drinks.  Drinking alcohol.  Stress.  Taking blood pressure medicine.  Long-term use of topical steroids on the face. What increases the risk? You are more likely to develop this condition if you:  Are older than 78 years of age.  Are a woman.  Have light-colored skin (light complexion).  Have a family history of rosacea. What are the signs or symptoms? Symptoms of this condition include:  Redness of the face.  Red bumps or pimples on the face.  A red, enlarged nose.  Blushing easily.  Red lines on the skin.  Irritated, burning, or itchy feeling in the eyes.  Swollen eyelids.  Drainage from the eyes.  Feeling like there is something in your eye. How is this diagnosed? This condition is diagnosed with a medical history and physical exam. How is this treated? There is no cure for this condition, but treatment can help to control your symptoms. Your health care provider may recommend that you see a skin specialist (dermatologist). Treatment may include:  Medicines that are applied to the skin or taken by mouth (orally). This can include antibiotic medicines.  Laser treatment to improve the appearance of the skin.  Surgery. This is rare. Your health care provider will also recommend the best way to take care of your skin. Even after your skin improves, you will likely need to continue treatment to prevent your rosacea from coming back. Follow these instructions at home: Skin care Take care  of your skin as told by your health care provider. You may be told to do these things:  Wash your skin gently two or more times each day.  Use mild soap.  Use a sunscreen or sunblock with SPF 30 or greater.  Use gentle cosmetics that are meant for sensitive skin.  Shave with an electric shaver instead of a blade. Lifestyle  Try to keep track of what foods trigger this condition. Avoid any triggers. These may include: ? Spicy foods. ? Seafood. ? Cheese. ? Hot liquids. ? Nuts. ? Chocolate. ? Iodized salt.  Do not drink alcohol.  Avoid extremely cold or hot temperatures.  Try to reduce your stress. If you need help, talk with your health care provider.  When you exercise, do these things to stay cool: ? Limit sun exposure to your face. ? Use a fan. ? Do shorter and more frequent intervals of exercise. General instructions  Take and apply over-the-counter and prescription medicines only as told by your health care provider.  If you were prescribed an antibiotic medicine, apply it or take it as told by your health care provider. Do not stop using the antibiotic even if your condition improves.  If your eyelids are affected, apply warm compresses to them. Do this as told by your health care provider.  Keep all follow-up visits as told by your health care provider. This is important. Contact a health care provider if:  Your symptoms get worse.  Your symptoms do not improve after 2 months of treatment.  You have new   symptoms.  You have any changes in vision or you have problems with your eyes, such as redness or itching.  You feel depressed.  You lose your appetite.  You have trouble concentrating. Summary  Rosacea is a long-term (chronic) condition that affects the skin of the face, including the cheeks, nose, forehead, and chin.  Take care of your skin as told by your health care provider.  Take and apply over-the-counter and prescription medicines only as told  by your health care provider.  Contact a health care provider if your symptoms get worse or if you have any changes in vision or other problems with your eyes, such as redness or itching.  Keep all follow-up visits as told by your health care provider. This is important. This information is not intended to replace advice given to you by your health care provider. Make sure you discuss any questions you have with your health care provider. Document Released: 08/11/2004 Document Revised: 12/06/2017 Document Reviewed: 12/06/2017 Elsevier Patient Education  2020 [employer]vier Inc.  

## 2019-02-19 NOTE — Progress Notes (Signed)
Telephone Note  I connected with [name]   on 02/19/19 at 10:00 AM EDT by telephone and verified that I am speaking with the correct person using two identifiers.  Location patient: home Location provider:work or home office Persons participating in the virtual visit: patient, provider  I discussed the limitations of evaluation and management by telemedicine and the availability of in person appointments. The patient expressed understanding and agreed to proceed.   HPI: 1. C/o IBS with loose stools chronically she does eat a lot of fruit and veggies last colonoscopy 2014 and rec no more  -she wants to do cologuard  2. C/o depression due to 4 deaths close to to her a young aunt of cancer, and former fiance and 1 of friends with stage 4 cancer and in the house and not able to go to church of out with friends in past was on zoloft 25 mg and wellbutrin 150 mg xl and wants to resume zoloft  3. H/o skin cancer AKs and SCC h/o stage 3 MM in 2012 she will call dermatology to f/u and also c/o flushing to her race which is new and face is not itching.  4. She reports she had mold in her home but repaired this   ROS: See pertinent positives and negatives per HPI.  Past Medical History:  Diagnosis Date  . Acid reflux   . Allergy   . Anemia   . Anxiety    in past  . Arthritis    all over  . Atypical moles   . Bronchitis   . Bronchospasm    reactive with perfume/cologne and smoke  . CAD (coronary artery disease)    patient denies on preop of 10/24   . Candida esophagitis ([place])   . Carotid artery calcification, bilateral 04/2016  . Cataract   . Cervical stenosis of spine   . Chronic insomnia   . Complication of anesthesia    shoulder surgery- not all the way asleep  . COPD (chronic obstructive pulmonary disease) (HCC)    chronic bronchitis   . Depression    in past  . Diverticulitis   . Diverticulosis   . Dizziness   . Elevated liver enzymes   . Family history of adverse  reaction to anesthesia    sister- has problems with nausea and vomiting   . Fatigue   . Fatty liver   . Generalized headache    migraines  . GERD (gastroesophageal reflux disease)   . History of chemotherapy    topical cream for h/o skin CA  . History of kidney stones   . History of neuropathy    FH Dr. Posey Pronto neurology  . History of vertigo   . Hx of migraines   . Hyperlipidemia   . Hypothyroidism   . Insomnia   . Irritable bowel syndrome   . LLQ abdominal pain   . Melanoma ([place]) 2013  . Melanoma ([place])    x2 stg II melanoma  . Neuropathy, peripheral   . Peripheral neuropathy   . Pneumonia    hx of x 2  . PONV (postoperative nausea and vomiting)   . Pre-diabetes   . Retina disorder    left  . SCC (squamous cell carcinoma)    skin   . Stroke [facility])    ? TIA per Dr V per patient   . Thyroid disease   . Urine incontinence   . Vitamin D deficiency   . Weight gain  Past Surgical History:  Procedure Laterality Date  . BILATERAL SALPINGOOPHORECTOMY     ovarian cyst Dr. Ouida Sills and Dr. Alisia Ferrari 2008  . BREAST SURGERY     in 2014 bx   . BREAST SURGERY     augmentation with saline Dr. Hubbard Hartshorn 1995   . CHOLECYSTECTOMY N/A 01/28/2015   Procedure: LAPAROSCOPIC CHOLECYSTECTOMY WITH INTRAOPERATIVE CHOLANGIOGRAM;  Surgeon: Autumn Messing III, MD;  Location: Home;  Service: General;  Laterality: N/A;  . CLOSED MANIPULATION SHOULDER     rt  . COLONOSCOPY WITH PROPOFOL N/A 11/20/2012   Procedure: COLONOSCOPY WITH PROPOFOL;  Surgeon: Garlan Fair, MD;  Location: [facility];  Service: Endoscopy;  Laterality: N/A;  . colonscopy     May 2014  . JOINT REPLACEMENT     RTK  . KNEE ARTHROSCOPY    . left knee miniscus tear repair    . MELANOMA EXCISION WITH SENTINEL LYMPH NODE BIOPSY  03/18/11   Back, nodes neg  . OOPHORECTOMY    . ROTATOR CUFF REPAIR     left  . SHOULDER SURGERY     right and left   . TOTAL HIP ARTHROPLASTY    . TOTAL KNEE ARTHROPLASTY     right 2010  and left in 2018   . TOTAL KNEE ARTHROPLASTY Left 05/16/2017   Procedure: LEFT TOTAL KNEE ARTHROPLASTY;  Surgeon: Paralee Cancel, MD;  Location: [facility];  Service: Orthopedics;  Laterality: Left;  70 mins  . TUBAL LIGATION    . TUMOR REMOVAL     ovary    Family History  Problem Relation Age of Onset  . Hypertension Mother   . Stroke Mother   . Hyperlipidemia Mother   . Heart disease Father   . Diabetes Father   . Hypertension Father   . Hyperlipidemia Father   . Early death Father   . Diabetes Sister   . Heart disease Sister        heart murmur  . Arthritis Sister   . COPD Sister   . Hyperlipidemia Sister   . Hypertension Sister   . Pancreatic cancer Maternal Uncle   . Alzheimer's disease Paternal Aunt   . Colon cancer Maternal Grandfather   . Cancer Maternal Grandfather   . Early death Maternal Grandfather   . Alzheimer's disease Paternal Aunt   . Alzheimer's disease Paternal Aunt   . Colon polyps Sister        adenomous  . Arthritis Sister   . Heart disease Sister   . Hyperlipidemia Sister   . Hypertension Sister   . Cancer Sister   . Heart disease Daughter   . Hyperlipidemia Daughter   . Hypertension Daughter   . Heart disease Son   . Hyperlipidemia Son   . Alcohol abuse Son   . Depression Son   . Diabetes Son   . Stroke Maternal Grandmother   . Early death Paternal Grandmother   . Stroke Paternal Grandmother   . Cancer Other        2 paternal uncles and 1 maternal uncle colon cancer    SOCIAL HX:  Patient lives at home alone in a one story home. Has 3 children (2 daughters and 1 son)  Retired from [employer] Divorced since 1992  Caffeine Use: 1 cup daily No guns  Wears seat belt    Current Outpatient Medications:  .  albuterol (PROAIR HFA) 108 (90 Base) MCG/ACT inhaler, ProAir HFA 90 mcg/actuation aerosol inhaler  INHALE 1 2 PUFFS  INTO THE LUNGS EVERY 6 (SIX) HOURS AS NEEDED FOR WHEEZING OR SHORTNESS OF BREATH., Disp: , Rfl:  .   Apoaequorin (PREVAGEN) 10 MG CAPS, Take by mouth., Disp: , Rfl:  .  Ascorbic Acid (VITAMIN C) 1000 MG tablet, Take 1,000 mg by mouth daily., Disp: , Rfl:  .  aspirin 81 MG tablet, TAKE 1 TABLET BY MOUTH EVERY DAY, Disp: , Rfl:  .  atorvastatin (LIPITOR) 10 MG tablet, Take 10 mg daily by mouth. , Disp: , Rfl:  .  Calcium 600-200 MG-UNIT tablet, Take 1 tablet daily by mouth., Disp: , Rfl:  .  Cholecalciferol (VITAMIN D3) 2000 UNITS capsule, Take 2,000 Units by mouth daily., Disp: , Rfl:  .  Coenzyme Q10 (COQ10) 30 MG CAPS, Take by mouth., Disp: , Rfl:  .  diclofenac sodium (VOLTAREN) 1 % GEL, Voltaren 1 % topical gel  APPLY 4 GRAMS TO THE AFFECTED AREA(S) BY TOPICAL ROUTE 4 TIMES PER DAY, Disp: , Rfl:  .  furosemide (LASIX) 20 MG tablet, Take 1 tablet (20 mg total) by mouth daily. In am for leg swelling, Disp: 30 tablet, Rfl: 5 .  levothyroxine (SYNTHROID) 50 MCG tablet, Take 1 tablet (50 mcg total) by mouth daily before breakfast., Disp: 30 tablet, Rfl: 1 .  mirabegron ER (MYRBETRIQ) 25 MG TB24 tablet, Take 1 tablet (25 mg total) by mouth daily., Disp: 30 tablet, Rfl:  .  Multiple Vitamins-Minerals (PRESERVISION AREDS 2 PO), Take 1 capsule by mouth daily., Disp: , Rfl:  .  Omega-3 Fatty Acids (FISH OIL) 1000 MG CAPS, Take 1,000 mg by mouth daily. , Disp: , Rfl:  .  omeprazole (PRILOSEC) 20 MG capsule, Take 1 capsule (20 mg total) by mouth 2 (two) times daily before a meal., Disp: 60 capsule, Rfl: 3 .  vitamin B-12 (CYANOCOBALAMIN) 1000 MCG tablet, Take 2,000 mcg daily by mouth. 2 tabs, Disp: , Rfl:  .  sertraline (ZOLOFT) 25 MG tablet, Take 1 tablet (25 mg total) by mouth daily., Disp: 90 tablet, Rfl: 3  EXAM:  VITALS per patient if applicable:  GENERAL: alert, oriented, appears well and in no acute distress  PSYCH/NEURO: pleasant and cooperative, no obvious depression or anxiety, speech and thought processing grossly intact  ASSESSMENT AND PLAN:  Discussed the following assessment and  plan:  Depression, recurrent ([place]) - Plan: sertraline (ZOLOFT) 25 MG tablet qd  Call back in 4 weeks to see how doing  phq 9 score today done score 8  Irritable bowel syndrome with diarrhea - Plan: rec prn immodium   Chronic cough - Plan: f/u pulm leb 04/2019  Rec continue ppi bid   Red face ? Rosacea -rec pt call dermatology to discuss less likely meds  -consider ANA check if continues   HM Flu shot had 04/12/18  Tdap-had TD 10/18/12 not pertussis will need this in future shingrixwill need  zostervax had 06/16/11 prevnarhad 05/15/14 Pna 23had 12/16/05, 05/18/16 -[place] [facility] [place] in my note  Pap out of age window LMP [place] with implants 01/15/2019 normal  -10/18/16 screening with implants neg FH breast cancer p cousins dx'ed in 103s x 19, sister Had at age 66 y.o and colon cancer age 70, 2 paternal cousins ovarian cancer age 64 and 60   Colonoscopy 11/2012 tortuous colon no further rec.  -Consider cologuard in future if no h/o polyps ordered today 02/19/2019  DEXA Sadie Haber PCP 06/28/16 normal reviewed report did not scan into chart -h/o vitamin D def on  D3 2000 IU daily  Prediabetes A1C 08/15/18 6.110/2019 60 CBC normal 08/15/18, CMET Cr. 1.0 GFR 54 (slightly low), UA rare bacteria, TSH 1.524   [facility] saw 01/01/19 increased nocturia and urgency pending Botox to tx and UDS Dr. Junious Silk and try myrbetriq before UDS   Cards-Dr. Rockey Situ  [facility]  Eye MD Dr. Cordelia Pen [facility]-retinal specialist  Dentist Dr. Lynelle Smoke  Neurology-[facility]  ENT-Dr. Tami Ribas for vestibular balance issue s Podiatry Dr. Milinda Pointer  [facility]   Dermatology Dr. Delman Cheadle in [place] h/o [facility] left leg and MM right leg,neck and backwith node dissection Dr. Marlou Starks 8/12  Former PCP Dr. Jossie Ng requested labs, all vaccines, DEXA -pt brought in all records week of 09/21/2018 and reviewed  -h/o iron def anemia  -last seen 07/09/18 and 08/09/18 possible  thrush and URI  US carotid 05/13/16 <50% stenosis in right and left ICA -she was at this time put on lipitor 10 mg qhs   CT ab/pelvis 07/26/17 left base scarring lung, well circumcised liver lesion likely cysts and stable hepatomegaly 19.4 cm cranlocaudal pancreas/spleen normal, extensive colonic diverticulosis, aortic and branch vessell atherosclerosis no adenopathy, normal uterus and adnexa, advanced LS spondylosis grade 1 L4/5 and L5/S1 anterolisthesis are similar, pelvic floor laxity, hiatal hernia  CXR 02/26/18 thoracic aorta mildly atherosclerotic unchanged, Deg changes T spine, Deg changes b/l shoulders prior right shoulder surgery calcification distal left supraspinatus tendon at its insertion on the great tuberosity of the left humerus   UA normal 07/09/18  04/23/18 A1c 6.0  04/23/18 CBC 6.2/14.2/42.9/plts 199  04/23/18 CMET Cr 0.88 BUN 20 EGFR 62, total bili chronically elevated 1.1 (prior direct bili low 0.1 likely unconjugated high) AST15, ALT 14 normal  04/23/18 lipidTC 155, TG 143, HDL 68, LDL 58 10/17/17 vitamin D 41.1 04/18/16 33.7  TSH 06/29/17 2.06  FOBT neg x 3 05/19/16       I discussed the assessment and treatment plan with the patient. The patient was provided an opportunity to ask questions and all were answered. The patient agreed with the plan and demonstrated an understanding of the instructions.   The patient was advised to call back or seek an in-person evaluation if the symptoms worsen or if the condition fails to improve as anticipated.  Time spent 20 minutes  Delorise Jackson, MD

## 2019-02-21 ENCOUNTER — Ambulatory Visit: Payer: PPO

## 2019-02-21 ENCOUNTER — Telehealth: Payer: Self-pay | Admitting: Internal Medicine

## 2019-02-21 DIAGNOSIS — H43811 Vitreous degeneration, right eye: Secondary | ICD-10-CM | POA: Diagnosis not present

## 2019-02-21 DIAGNOSIS — H2513 Age-related nuclear cataract, bilateral: Secondary | ICD-10-CM | POA: Diagnosis not present

## 2019-02-21 DIAGNOSIS — H35363 Drusen (degenerative) of macula, bilateral: Secondary | ICD-10-CM | POA: Diagnosis not present

## 2019-02-21 DIAGNOSIS — H35372 Puckering of macula, left eye: Secondary | ICD-10-CM | POA: Diagnosis not present

## 2019-02-21 NOTE — Telephone Encounter (Signed)
I called pt and left a vm to call ofc to sch Return in about 3 months (around 05/22/2019).

## 2019-03-05 ENCOUNTER — Other Ambulatory Visit: Payer: Self-pay

## 2019-03-05 DIAGNOSIS — Z20822 Contact with and (suspected) exposure to covid-19: Secondary | ICD-10-CM

## 2019-03-06 LAB — NOVEL CORONAVIRUS, NAA: SARS-CoV-2, NAA: NOT DETECTED

## 2019-03-08 DIAGNOSIS — N3281 Overactive bladder: Secondary | ICD-10-CM | POA: Insufficient documentation

## 2019-03-08 DIAGNOSIS — M47817 Spondylosis without myelopathy or radiculopathy, lumbosacral region: Secondary | ICD-10-CM | POA: Diagnosis not present

## 2019-03-08 DIAGNOSIS — M545 Low back pain: Secondary | ICD-10-CM | POA: Diagnosis not present

## 2019-03-12 ENCOUNTER — Telehealth: Payer: Self-pay

## 2019-03-12 NOTE — Telephone Encounter (Signed)
Copied from [place] [id number]. 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 Please resend with dr signature.

## 2019-03-12 NOTE — Telephone Encounter (Signed)
Printed requisition for you to sign

## 2019-03-12 NOTE — Telephone Encounter (Signed)
Signed in box   [place]

## 2019-03-14 NOTE — Telephone Encounter (Signed)
faxed

## 2019-03-21 NOTE — Telephone Encounter (Signed)
Called patient to triage frequency of diarrhea.  No answer.  LMTCB.

## 2019-03-21 NOTE — Telephone Encounter (Signed)
We ordered cologuard 02/19/19 did she get it mailed to her?  -if not call company or send another order   Also if she is having diarrhea will refer her to [place] (bananas, rice, applesauce, toast, crackers, increase hydration with low sugar gatorade or water, broth) Reduce grease  [place]

## 2019-03-21 NOTE — Telephone Encounter (Signed)
Patient called to check the status of the cologard test.  Patient states that she is still having the diarrhea.  Please advise and call patient back to discuss.  CB# [phone]

## 2019-03-22 ENCOUNTER — Other Ambulatory Visit: Payer: Self-pay | Admitting: Internal Medicine

## 2019-03-22 DIAGNOSIS — R197 Diarrhea, unspecified: Secondary | ICD-10-CM

## 2019-03-22 DIAGNOSIS — L570 Actinic keratosis: Secondary | ICD-10-CM | POA: Diagnosis not present

## 2019-03-22 DIAGNOSIS — Q828 Other specified congenital malformations of skin: Secondary | ICD-10-CM | POA: Diagnosis not present

## 2019-03-22 DIAGNOSIS — C44729 Squamous cell carcinoma of skin of left lower limb, including hip: Secondary | ICD-10-CM | POA: Diagnosis not present

## 2019-03-22 DIAGNOSIS — D485 Neoplasm of uncertain behavior of skin: Secondary | ICD-10-CM | POA: Diagnosis not present

## 2019-03-22 NOTE — Telephone Encounter (Signed)
Patient has been informed.

## 2019-03-26 DIAGNOSIS — I872 Venous insufficiency (chronic) (peripheral): Secondary | ICD-10-CM | POA: Diagnosis not present

## 2019-03-26 DIAGNOSIS — L570 Actinic keratosis: Secondary | ICD-10-CM | POA: Diagnosis not present

## 2019-04-02 ENCOUNTER — Ambulatory Visit: Payer: Self-pay | Admitting: *Deleted

## 2019-04-02 NOTE — Telephone Encounter (Signed)
I scheduled patient for tomorrow in office refuses to see UC or ER ants to see PCP advised calling dermatology but patient refuses being related to area where biopsy performed stated lower than biopsy area.

## 2019-04-02 NOTE — Telephone Encounter (Signed)
Ill see tomorrow but area may be related to prior biopsy and would call dermatology as well to let them know   [place]

## 2019-04-02 NOTE — Telephone Encounter (Signed)
Pt called stating that she has a red area (4-5 in x 2-3 in) on her left leg, which started on a week ago; it is located between her shin and ankle; she previously had a biopsy on her 03/19/2019; the area is sore to touch and goes down to her ankle; she is scheduled for surgery due to squamous cell carcinoma due to this; her biopsy was done by Dr Delman Cheadle, Dermatology; in [place]; the pain and redness is intermittent, and is more prominent in the mornings; recommendations made per nurse triage protocol; further explained to pt that the office may not be able to meet this timeframe, and they may request that she go to Urgent Care or ER; she verbalized understanding; she sees Dr Terese Door, [place]; pt transferred to [facility] for scheduling/ final disposition per PCP.   Reason for Disposition . [1] Red area or streak AND [2] large (> 2 in. or 5 cm)  Answer Assessment - Initial Assessment Questions 1. ONSET: "When did the pain start?"      03/26/2019 2. LOCATION: "Where is the pain located?"     Left leg between shin and ankle 3. PAIN: "How bad is the pain?"    (Scale 1-10; or mild, moderate, severe)   -  MILD (1-3): doesn't interfere with normal activities    -  MODERATE (4-7): interferes with normal activities (e.g., work or school) or awakens from sleep, limping    -  SEVERE (8-10): excruciating pain, unable to do any normal activities, unable to walk    1-2 out of 10 4. WORK OR EXERCISE: "Has there been any recent work or exercise that involved this part of the body?"      no 5. CAUSE: "What do you think is causing the leg pain?"    In connection biopsy 6. OTHER SYMPTOMS: "Do you have any other symptoms?" (e.g., chest pain, back pain, breathing difficulty, swelling, rash, fever, numbness, weakness)   Red, tender to the touch, prickly feeling intermittently 7. PREGNANCY: "Is there any chance you are pregnant?" "When was your last menstrual period?"  no  Protocols used: LEG PAIN-A-AH

## 2019-04-03 ENCOUNTER — Ambulatory Visit (INDEPENDENT_AMBULATORY_CARE_PROVIDER_SITE_OTHER): Payer: PPO

## 2019-04-03 ENCOUNTER — Other Ambulatory Visit: Payer: Self-pay

## 2019-04-03 ENCOUNTER — Ambulatory Visit (INDEPENDENT_AMBULATORY_CARE_PROVIDER_SITE_OTHER): Payer: PPO | Admitting: Internal Medicine

## 2019-04-03 DIAGNOSIS — B370 Candidal stomatitis: Secondary | ICD-10-CM | POA: Diagnosis not present

## 2019-04-03 DIAGNOSIS — M25562 Pain in left knee: Secondary | ICD-10-CM

## 2019-04-03 DIAGNOSIS — L03116 Cellulitis of left lower limb: Secondary | ICD-10-CM

## 2019-04-03 DIAGNOSIS — M7050 Other bursitis of knee, unspecified knee: Secondary | ICD-10-CM | POA: Diagnosis not present

## 2019-04-03 DIAGNOSIS — Z96652 Presence of left artificial knee joint: Secondary | ICD-10-CM | POA: Diagnosis not present

## 2019-04-03 MED ORDER — MUPIROCIN 2 % EX OINT: 1.0000 "application " | TOPICAL_OINTMENT | Freq: Three times a day (TID) | CUTANEOUS | 2 refills | Status: DC

## 2019-04-03 MED ORDER — FLUCONAZOLE 150 MG PO TABS: 150.0000 mg | ORAL_TABLET | Freq: Once | ORAL | 0 refills | Status: AC

## 2019-04-03 NOTE — Patient Instructions (Signed)
Pes Anserine Bursitis  The pes anserine is an area on the inside of your knee, just below the joint, that is cushioned by a fluid-filled sac (bursa). Pes anserine bursitis is a condition that happens when the bursa gets swollen and irritated. The condition causes knee pain. What are the causes? This condition may be caused by:  Making the same movement over and over.  A direct hit (trauma) to the inside of the leg. What increases the risk? You are more likely to develop this condition if you:  Are a runner.  Play sports that involve a lot of running and quick side-to-side movements (cutting).  Are an athlete who plays contact sports.  Swim using an inward angle of the knee, such as with the breaststroke.  Have tight hamstring muscles.  Are a woman.  Are overweight.  Have flat feet.  Have diabetes or osteoarthritis. What are the signs or symptoms? Symptoms of this condition include:  Knee pain that gets better with rest and worse with activities like climbing stairs, walking, running, or getting in and out of a chair.  Swelling.  Warmth.  Tenderness when pressing at the inside of the lower leg, just below the knee. How is this diagnosed? This condition may be diagnosed based on:  Your symptoms.  Your medical history.  A physical exam. ? During your physical exam, your health care provider will press on the tendon attachment to see if you feel pain. ? Your health care provider may also check your hip and knee motion and strength.  Tests to check for swelling and fluid buildup in the bursa and to look at muscles, bones, and tendons. These tests might include: ? X-rays. ? MRI. ? Ultrasound. How is this treated? This condition may be treated by:  Resting your knee. You may be told to raise (elevate) your knee while resting.  Avoiding activities that cause pain.  Icing the inside of your knee.  Sleeping with a pillow between your knees. This will cushion your  injured knee.  Taking medicine by mouth (orally) to reduce pain and swelling or having medicine injected into your knee.  Doing strengthening and stretching exercises (physical therapy). If these treatments do not work or if the condition keeps coming back, you may need to have surgery to remove the bursa. Follow these instructions at home: Managing pain, stiffness, and swelling   If directed, put ice on the injured area. ? Put ice in a plastic bag. ? Place a towel between your skin and the bag. ? Leave the ice on for 20 minutes, 2-3 times a day.  Elevate the injured area above the level of your heart while you are sitting or lying down. Activity  Return to your normal activities as told by your health care provider. Ask your health care provider what activities are safe for you.  Do exercises as told by your health care provider. General instructions  Take over-the-counter and prescription medicines only as told by your health care provider.  Sleep with a pillow between your knees.  Do not use any products that contain nicotine or tobacco, such as cigarettes, e-cigarettes, and chewing tobacco. These can delay healing. If you need help quitting, ask your health care provider.  If you are overweight, work with your health care provider and a dietitian to set a weight-loss goal that is healthy and reasonable for you.  Keep all follow-up visits as told by your health care provider. This is important. How is this prevented?    When exercising, make sure that you: ? Warm up and stretch before being active. ? Cool down and stretch after being active. ? Give your body time to rest between periods of activity. ? Use equipment that fits you. ? Are safe and responsible while being active to avoid falls. ? Do at least 150 minutes of moderate-intensity exercise each week, such as brisk walking or water aerobics. ? Maintain physical fitness, including:  Strength.  Flexibility.   Cardiovascular fitness.  Endurance. ? Maintain a healthy weight. Contact a health care provider if:  Your symptoms do not improve.  Your symptoms get worse. Summary  Pes anserine bursitis is a condition that happens when the fluid-filled sac (bursa) at the inside of your knee gets swollen and irritated. The condition causes knee pain.  Treatment for pes anserine bursitis may include resting your knee, icing the inside of your knee, sleeping with a pillow between your knees, taking medicine by mouth or by injection, and doing strengthening and stretching exercises (physical therapy).  Follow instructions for managing pain, stiffness, and swelling.  Take over-the-counter and prescription medicines only as told by your health care provider. This information is not intended to replace advice given to you by your health care provider. Make sure you discuss any questions you have with your health care provider. Document Released: 07/04/2005 Document Revised: 10/25/2018 Document Reviewed: 12/13/2017 Elsevier Patient Education  2020 [employer].

## 2019-04-03 NOTE — Progress Notes (Signed)
Pre visit review using our clinic review tool, if applicable. No additional management support is needed unless otherwise documented below in the visit note. 

## 2019-04-03 NOTE — Progress Notes (Signed)
Chief Complaint  Patient presents with  . Cellulitis   Acute visit  1. Left lower leg redness and swelling s/p bx anterior shin SCC with Dr. Delman Cheadle dermatology 2 weeks ago. Swelling and redness are less today  2. C/o diarrhea which is resolved and vomiting appt with GI 04/26/19 [facility] 3 pm. She also reports change in taste and h/o thrush since being on multiple antibiotics for respiratory illness of the last year and still c/o white change on tongue and abnormal taste will have her f/u with GI about this as well and continue nystatin she has a home and do 1x Diflucan   01/2019 CT chest  Small hiatal hernia. At least mild thickening of the walls of the lower esophagus/hiatal hernia, likely related to associated reflux Will f/u with GI .  3. C/o left knee pain and swelling and left lower shin swelling new s/p left total knee with Dr. Alvan Dame    Review of Systems  Constitutional: Negative for weight loss.  HENT: Negative for hearing loss.   Eyes: Negative for blurred vision.  Respiratory: Negative for shortness of breath.   Cardiovascular: Negative for chest pain.  Gastrointestinal: Positive for diarrhea and vomiting.       +thrush tongue   Musculoskeletal: Positive for joint pain.  Skin: Positive for rash.  Neurological: Negative for headaches.  Psychiatric/Behavioral: Negative for depression.   Past Medical History:  Diagnosis Date  . Acid reflux   . Allergy   . Anemia   . Anxiety    in past  . Arthritis    all over  . Atypical moles   . Bronchitis   . Bronchospasm    reactive with perfume/cologne and smoke  . CAD (coronary artery disease)    patient denies on preop of 10/24   . Candida esophagitis ([place])   . Carotid artery calcification, bilateral 04/2016  . Cataract   . Cervical stenosis of spine   . Chronic insomnia   . Complication of anesthesia    shoulder surgery- not all the way asleep  . COPD (chronic obstructive pulmonary disease) (H[facility])    chronic bronchitis   .  Depression    in past  . Diverticulitis   . Diverticulosis   . Dizziness   . Elevated liver enzymes   . Family history of adverse reaction to anesthesia    sister- has problems with nausea and vomiting   . Fatigue   . Fatty liver   . Generalized headache    migraines  . GERD (gastroesophageal reflux disease)   . History of chemotherapy    topical cream for h/o skin CA  . History of kidney stones   . History of neuropathy    FH Dr. Posey Pronto neurology  . History of vertigo   . Hx of migraines   . Hyperlipidemia   . Hypothyroidism   . Insomnia   . Irritable bowel syndrome   . LLQ abdominal pain   . Melanoma ([place]) 2013  . Melanoma ([place])    x2 stg II melanoma  . Neuropathy, peripheral   . Peripheral neuropathy   . Pneumonia    hx of x 2  . PONV (postoperative nausea and vomiting)   . Pre-diabetes   . Retina disorder    left  . SCC (squamous cell carcinoma)    skin   . Stroke [facility])    ? TIA per Dr V per patient   . Thyroid disease   . Urine incontinence   .  Vitamin D deficiency   . Weight gain    Past Surgical History:  Procedure Laterality Date  . BILATERAL SALPINGOOPHORECTOMY     ovarian cyst Dr. Ouida Sills and Dr. Alisia Ferrari 2008  . BREAST SURGERY     in 2014 bx   . BREAST SURGERY     augmentation with saline Dr. Hubbard Hartshorn 1995   . CHOLECYSTECTOMY N/A 01/28/2015   Procedure: LAPAROSCOPIC CHOLECYSTECTOMY WITH INTRAOPERATIVE CHOLANGIOGRAM;  Surgeon: Autumn Messing III, MD;  Location: [place];  Service: General;  Laterality: N/A;  . CLOSED MANIPULATION SHOULDER     rt  . COLONOSCOPY WITH PROPOFOL N/A 11/20/2012   Procedure: COLONOSCOPY WITH PROPOFOL;  Surgeon: Garlan Fair, MD;  Location: [facility];  Service: Endoscopy;  Laterality: N/A;  . colonscopy     May 2014  . JOINT REPLACEMENT     RTK  . KNEE ARTHROSCOPY    . left knee miniscus tear repair    . MELANOMA EXCISION WITH SENTINEL LYMPH NODE BIOPSY  03/18/11   Back, nodes neg  . OOPHORECTOMY    . ROTATOR CUFF  REPAIR     left  . SHOULDER SURGERY     right and left   . TOTAL HIP ARTHROPLASTY    . TOTAL KNEE ARTHROPLASTY     right 2010 and left in 2018   . TOTAL KNEE ARTHROPLASTY Left 05/16/2017   Procedure: LEFT TOTAL KNEE ARTHROPLASTY;  Surgeon: Paralee Cancel, MD;  Location: [facility];  Service: Orthopedics;  Laterality: Left;  70 mins  . TUBAL LIGATION    . TUMOR REMOVAL     ovary   Family History  Problem Relation Age of Onset  . Hypertension Mother   . Stroke Mother   . Hyperlipidemia Mother   . Heart disease Father   . Diabetes Father   . Hypertension Father   . Hyperlipidemia Father   . Early death Father   . Diabetes Sister   . Heart disease Sister        heart murmur  . Arthritis Sister   . COPD Sister   . Hyperlipidemia Sister   . Hypertension Sister   . Pancreatic cancer Maternal Uncle   . Alzheimer's disease Paternal Aunt   . Colon cancer Maternal Grandfather   . Cancer Maternal Grandfather   . Early death Maternal Grandfather   . Alzheimer's disease Paternal Aunt   . Alzheimer's disease Paternal Aunt   . Colon polyps Sister        adenomous  . Arthritis Sister   . Heart disease Sister   . Hyperlipidemia Sister   . Hypertension Sister   . Cancer Sister   . Heart disease Daughter   . Hyperlipidemia Daughter   . Hypertension Daughter   . Heart disease Son   . Hyperlipidemia Son   . Alcohol abuse Son   . Depression Son   . Diabetes Son   . Stroke Maternal Grandmother   . Early death Paternal Grandmother   . Stroke Paternal Grandmother   . Cancer Other        2 paternal uncles and 1 maternal uncle colon cancer   Social History   Socioeconomic History  . Marital status: Divorced    Spouse name: Not on file  . Number of children: 3  . Years of education: College  . Highest education level: Some college, no degree  Occupational History  . Occupation: Retired    Fish farm manager: RETIRED  Social Needs  . Financial resource strain: Not  on file  . Food insecurity     Worry: Not on file    Inability: Not on file  . Transportation needs    Medical: Not on file    Non-medical: Not on file  Tobacco Use  . Smoking status: Never Smoker  . Smokeless tobacco: Never Used  Substance and Sexual Activity  . Alcohol use: Yes    Alcohol/week: 1.0 standard drinks    Types: 1 Cans of beer per week    Comment: once a week beer or wine  . Drug use: No  . Sexual activity: Not Currently  Lifestyle  . Physical activity    Days per week: Not on file    Minutes per session: Not on file  . Stress: Not on file  Relationships  . Social Herbalist on phone: Not on file    Gets together: Not on file    Attends religious service: Not on file    Active member of club or organization: Not on file    Attends meetings of clubs or organizations: Not on file    Relationship status: Not on file  . Intimate partner violence    Fear of current or ex partner: Not on file    Emotionally abused: Not on file    Physically abused: Not on file    Forced sexual activity: Not on file  Other Topics Concern  . Not on file  Social History Narrative   Patient lives at home alone in a one story home.   Has 3 children (2 daughters and 1 son)    Retired from [employer]   Divorced since 1992    Caffeine Use: 1 cup daily   No guns    Wears seat belt    Lives with small dog    Current Meds  Medication Sig  . albuterol (PROAIR HFA) 108 (90 Base) MCG/ACT inhaler ProAir HFA 90 mcg/actuation aerosol inhaler  INHALE 1 2 PUFFS INTO THE LUNGS EVERY 6 (SIX) HOURS AS NEEDED FOR WHEEZING OR SHORTNESS OF BREATH.  Marland Kitchen Apoaequorin (PREVAGEN) 10 MG CAPS Take by mouth.  . Ascorbic Acid (VITAMIN C) 1000 MG tablet Take 1,000 mg by mouth daily.  Marland Kitchen aspirin 81 MG tablet TAKE 1 TABLET BY MOUTH EVERY DAY  . atorvastatin (LIPITOR) 10 MG tablet Take 10 mg daily by mouth.   . Calcium 600-200 MG-UNIT tablet Take 1 tablet daily by mouth.  . Cholecalciferol (VITAMIN D3) 2000 UNITS  capsule Take 2,000 Units by mouth daily.  . Coenzyme Q10 (COQ10) 30 MG CAPS Take by mouth.  . diclofenac sodium (VOLTAREN) 1 % GEL Voltaren 1 % topical gel  APPLY 4 GRAMS TO THE AFFECTED AREA(S) BY TOPICAL ROUTE 4 TIMES PER DAY  . furosemide (LASIX) 20 MG tablet Take 1 tablet (20 mg total) by mouth daily. In am for leg swelling  . levothyroxine (SYNTHROID) 50 MCG tablet Take 1 tablet (50 mcg total) by mouth daily before breakfast.  . mirabegron ER (MYRBETRIQ) 25 MG TB24 tablet Take 1 tablet (25 mg total) by mouth daily.  . Multiple Vitamins-Minerals (PRESERVISION AREDS 2 PO) Take 1 capsule by mouth daily.  . Omega-3 Fatty Acids (FISH OIL) 1000 MG CAPS Take 1,000 mg by mouth daily.   Marland Kitchen omeprazole (PRILOSEC) 20 MG capsule Take 1 capsule (20 mg total) by mouth 2 (two) times daily before a meal.  . sertraline (ZOLOFT) 25 MG tablet Take 1 tablet (25 mg total) by mouth daily.  Marland Kitchen  vitamin B-12 (CYANOCOBALAMIN) 1000 MCG tablet Take 2,000 mcg daily by mouth. 2 tabs   Allergies  Allergen Reactions  . Epinephrine Other (See Comments) and Anaphylaxis    Heart palpitations  . Penicillins Swelling and Other (See Comments)    Lip swelling Has patient had a PCN reaction causing immediate rash, facial/tongue/throat swelling, SOB or lightheadedness with hypotension: Yes Has patient had a PCN reaction causing severe rash involving mucus membranes or skin necrosis: No Has patient had a PCN reaction that required hospitalization No Has patient had a PCN reaction occurring within the last 10 years: No If all of the above answers are "NO", then may proceed with Cephalosporin use.  . Lidocaine Hcl Other (See Comments)  . Other     Black pepper- caused bronchial tubes to start closing   . Valium [Diazepam] Other (See Comments)    Other reaction(s): Other (See Comments) Overly sensitive  sedation   Recent Results (from the past 2160 hour(s))  [facility] MAMMOGRAPHY     Status: None   Collection Time: 01/15/19 12:00  AM  Result Value Ref Range   HM Mammogram 0-4 Bi-Rad 0-4 Bi-Rad, Self Reported Normal    Comment: [name] negative 01/16/19   Novel Coronavirus, NAA ([facility])     Status: None   Collection Time: 02/08/19 12:00 AM  Result Value Ref Range   SARS-CoV-2, NAA Not Detected Not Detected    Comment: Testing was performed using the cobas(R) SARS-CoV-2 test. This test was developed and its performance characteristics determined by [employer]. This test has not been FDA cleared or approved. This test has been authorized by FDA under an Emergency Use Authorization (EUA). This test is only authorized for the duration of time the declaration that circumstances exist justifying the authorization of the emergency use of in vitro diagnostic tests for detection of SARS-CoV-2 virus and/or diagnosis of COVID-19 infection under section 564(b)(1) of the Act, 21 U.S.C. 335KTG-2(B)(6), unless the authorization is terminated or revoked sooner. When diagnostic testing is negative, the possibility of a false negative result should be considered in the context of a patient's recent exposures and the presence of clinical signs and symptoms consistent with COVID-19. An individual without symptoms of COVID-19 and who is not shedding SARS-CoV-2 virus would expect to have a negati ve (not detected) result in this assay.   Novel Coronavirus, NAA ([facility])     Status: None   Collection Time: 03/05/19 12:00 AM   Specimen: Oropharyngeal(OP) collection in vial transport medium   OROPHARYNGEA  TESTING  Result Value Ref Range   SARS-CoV-2, NAA Not Detected Not Detected    Comment: Testing was performed using the cobas(R) SARS-CoV-2 test. This test was developed and its performance characteristics determined by [employer]. This test has not been FDA cleared or approved. This test has been authorized by FDA under an Emergency Use Authorization (EUA). This test is only authorized for the duration of time  the declaration that circumstances exist justifying the authorization of the emergency use of in vitro diagnostic tests for detection of SARS-CoV-2 virus and/or diagnosis of COVID-19 infection under section 564(b)(1) of the Act, 21 U.S.C. 389HTD-4(K)(8), unless the authorization is terminated or revoked sooner. When diagnostic testing is negative, the possibility of a false negative result should be considered in the context of a patient's recent exposures and the presence of clinical signs and symptoms consistent with COVID-19. An individual without symptoms of COVID-19 and who is not shedding SARS-CoV-2 virus would expect to have a negati ve (  not detected) result in this assay.    Objective  There is no height or weight on file to calculate BMI. Wt Readings from Last 3 Encounters:  02/19/19 188 lb (85.3 kg)  01/29/19 186 lb (84.4 kg)  01/25/19 186 lb 12.8 oz (84.7 kg)   Temp Readings from Last 3 Encounters:  01/25/19 98.4 F (36.9 C) (Skin)  12/28/18 97.9 F (36.6 C) (Oral)  10/27/18 97.8 F (36.6 C) (Oral)   BP Readings from Last 3 Encounters:  01/29/19 (!) 140/46  01/25/19 130/78  12/28/18 122/72   Pulse Readings from Last 3 Encounters:  01/29/19 63  01/25/19 74  12/28/18 87    Physical Exam Vitals signs and nursing note reviewed.  Constitutional:      Appearance: Normal appearance. She is well-developed and well-groomed.  HENT:     Head: Normocephalic and atraumatic.     Comments: +mask on      Mouth/Throat:     Comments: Mild exudate to post tongue   Cardiovascular:     Rate and Rhythm: Normal rate and regular rhythm.     Heart sounds: Normal heart sounds.  Pulmonary:     Effort: Pulmonary effort is normal.     Breath sounds: Normal breath sounds.  Musculoskeletal:     Left knee: She exhibits swelling. Tenderness found.       Legs:  Skin:    General: Skin is warm and dry.       Neurological:     General: No focal deficit present.     Mental  Status: She is alert and oriented to person, place, and time. Mental status is at baseline.     Gait: Gait normal.  Psychiatric:        Attention and Perception: Attention and perception normal.        Mood and Affect: Mood and affect normal.        Speech: Speech normal.        Behavior: Behavior normal. Behavior is cooperative.        Thought Content: Thought content normal.        Cognition and Memory: Cognition and memory normal.        Judgment: Judgment normal.     Assessment  Plan  Left leg cellulitis - Plan: mupirocin ointment (BACTROBAN) 2 % Will call back in 1 week to see how doing  Try to avoid oral Ab with h/o thrush for now but if not better will start  Thrush - Plan: fluconazole (DIFLUCAN) 150 MG tablet, continue with nystatin   Acute pain of left knee - Plan: DG Knee Complete 4 Views Left Anserine bursitis  -if continues to be painful f/u with Dr. Alvan Dame  C/o diarrhea which is resolved and vomiting appt with GI 04/26/19 [facility] 3 pm. She also reports change in taste and h/o thrush since being on multiple antibiotics for respiratory illness of the last year and still c/o white change on tongue and abnormal taste will have her f/u with GI about this as well and continue nystatin she has a home and do 1x Diflucan   01/2019 CT chest  Small hiatal hernia. At least mild thickening of the walls of the lower esophagus/hiatal hernia, likely related to associated reflux Will f/u with GI   HM-labs in future at f/u in 3 months 06/2019   Flu shot had 04/12/18 will wait until 04/2019  Tdap-had TD 10/18/12 not pertussis will need this in future shingrixwill need  zostervax had 06/16/11  prevnarhad 05/15/14 Pna 23had 12/16/05, 05/18/16 -[facility] [place] [facility] in my note  Pap out of age window LMP [place] with implants 01/15/2019 normal  -10/18/16 screening with implants neg FH breast cancer p cousins dx'ed in 39s x 3, sister Had at age 51  y.o and colon cancer age 69, 2 paternal cousins ovarian cancer age 86 and 24   Colonoscopy 11/2012 tortuous colon no further rec.  -Consider cologuard in future if no h/o polyps ordered today 02/19/2019 still not done as of 04/03/2019  DEXA -[facility] 06/28/16 normal reviewed report did not scan into chart -h/o vitamin D def on D3 2000 IU daily  Prediabetes A1C 08/15/18 6.110/2019 60 CBC normal 08/15/18, CMET Cr. 1.0 GFR 54 (slightly low), UA rare bacteria, TSH 1.524   [facility] saw 01/01/19 increased nocturia and urgency pending Botox to tx and UDS Dr. Junious Silk and try myrbetriq before UDS  Cards-Dr. Rockey Situ  [facility]  Eye MD Dr. Cordelia Pen [facility]-retinal specialist  Dentist Dr. Lynelle Smoke  Neurology-[facility]  ENT-Dr. Tami Ribas for vestibular balance issue s Podiatry Dr. Milinda Pointer  H/o D.r [facility]   Dermatology Dr. Delman Cheadle in [place] h/o SCC left leg and MM right leg,neck and backwith node dissection Dr. Marlou Starks 8/12 ortho Dr. Alvan Dame  Former PCP Dr. Jossie Ng requested labs, all vaccines, DEXA -pt brought in all records week of 09/21/2018 and reviewed  -h/o iron def anemia  -last seen 07/09/18 and 08/09/18 possible thrush and URI  US carotid 05/13/16 <50% stenosis in right and left ICA -she was at this time put on lipitor 10 mg qhs   CT ab/pelvis 07/26/17 left base scarring lung, well circumcised liver lesion likely cysts and stable hepatomegaly 19.4 cm cranlocaudal pancreas/spleen normal, extensive colonic diverticulosis, aortic and branch vessell atherosclerosis no adenopathy, normal uterus and adnexa, advanced LS spondylosis grade 1 L4/5 and L5/S1 anterolisthesis are similar, pelvic floor laxity, hiatal hernia  CXR 02/26/18 thoracic aorta mildly atherosclerotic unchanged, Deg changes T spine, Deg changes b/l shoulders prior right shoulder surgery calcification distal left supraspinatus tendon at its insertion on the great tuberosity of the left humerus   UA normal 07/09/18  04/23/18 A1c  6.0  04/23/18 CBC 6.2/14.2/42.9/plts 199  04/23/18 CMET Cr 0.88 BUN 20 EGFR 62, total bili chronically elevated 1.1 (prior direct bili low 0.1 likely unconjugated high) AST15, ALT 14 normal  04/23/18 lipidTC 155, TG 143, HDL 68, LDL 58 10/17/17 vitamin D 41.1 04/18/16 33.7  TSH 06/29/17 2.06  FOBT neg x 3 05/19/16   Provider: Dr. Olivia Mackie [facility]

## 2019-04-04 ENCOUNTER — Encounter: Payer: Self-pay | Admitting: Internal Medicine

## 2019-04-05 ENCOUNTER — Telehealth: Payer: Self-pay

## 2019-04-05 NOTE — Telephone Encounter (Signed)
Pt. Called back, given results from knee x-ray. States she is scheduled for surgery 04/10/19. After that, states she will follow up with Dr. Alvan Dame.

## 2019-04-08 ENCOUNTER — Telehealth: Payer: Self-pay | Admitting: *Deleted

## 2019-04-08 NOTE — Telephone Encounter (Signed)
Copied from [place] [phone]. Topic: General - Other >> Apr 08, 2019  9:13 AM Pauline Good wrote: Reason for CRM: pt is having her surgery 9.23.20 and want to know if she need to come back in for Dr Aundra Dubin to check the areas on her leg to make sure she is ok for surgery.

## 2019-04-08 NOTE — Telephone Encounter (Signed)
Unfortunately I dont have any availability if she is having surgery with dermatology they can examine her leg as well as they deal with skin as well   [place]

## 2019-04-09 NOTE — Telephone Encounter (Signed)
Patient has been notified

## 2019-04-10 DIAGNOSIS — C44729 Squamous cell carcinoma of skin of left lower limb, including hip: Secondary | ICD-10-CM | POA: Diagnosis not present

## 2019-04-22 ENCOUNTER — Telehealth: Payer: Self-pay

## 2019-04-22 NOTE — Telephone Encounter (Signed)
Copied from [place] [phone]. Topic: General - Other >> Apr 22, 2019  4:13 PM Leward Quan A wrote: Reason for CRM: Patient called to ask Dr Olivia Mackie if she can use the mupirocin ointment (BACTROBAN) 2 % on her chest area since she is having issues with the skin peeling off like how she used it on her leg. Asking if she need to be seen by Dr Olivia Mackie to evaluate. Please call patient at Ph# [phone]

## 2019-04-23 NOTE — Telephone Encounter (Signed)
I would rec she call her dermatologist  bactroban wont hurt its to help wound healing   [place]

## 2019-04-24 ENCOUNTER — Other Ambulatory Visit: Payer: Self-pay

## 2019-04-24 ENCOUNTER — Other Ambulatory Visit: Payer: Self-pay | Admitting: Internal Medicine

## 2019-04-24 ENCOUNTER — Ambulatory Visit: Payer: Self-pay | Admitting: *Deleted

## 2019-04-24 DIAGNOSIS — L03116 Cellulitis of left lower limb: Secondary | ICD-10-CM

## 2019-04-24 MED ORDER — DOXYCYCLINE HYCLATE 100 MG PO TABS: 100.0000 mg | ORAL_TABLET | Freq: Two times a day (BID) | ORAL | 0 refills | Status: DC

## 2019-04-24 NOTE — Telephone Encounter (Signed)
Lower left leg with edema, red and feels warm to touch. Was prescribed topical antibiotic. No improvement per the patient.Recently had surgery on the same leg for skin cancer and had used a topical chemotherapy agent for two weeks.  She does not wish to see the dermatologist. Requesting appointment with PCP. Denies fever/contacts/travel. Transferred for appointment.  No triage performed. Patient requested to explain and have the appointment scheduled.

## 2019-04-24 NOTE — Telephone Encounter (Signed)
Sent doxycycline 2x per day x 10 days with food for this   [place]

## 2019-04-24 NOTE — Telephone Encounter (Signed)
Appointment made for 04/25/2019 at 10am.

## 2019-04-24 NOTE — Telephone Encounter (Signed)
Pt states she has been waiting 2 days to hear back. I let her know you would call back.

## 2019-04-25 ENCOUNTER — Ambulatory Visit (INDEPENDENT_AMBULATORY_CARE_PROVIDER_SITE_OTHER): Payer: PPO | Admitting: Internal Medicine

## 2019-04-25 ENCOUNTER — Encounter: Payer: Self-pay | Admitting: Internal Medicine

## 2019-04-25 ENCOUNTER — Other Ambulatory Visit: Payer: Self-pay

## 2019-04-25 VITALS — BP 128/68 | HR 74 | Temp 97.7°F | Ht 64.0 in | Wt 185.0 lb

## 2019-04-25 DIAGNOSIS — K449 Diaphragmatic hernia without obstruction or gangrene: Secondary | ICD-10-CM | POA: Diagnosis not present

## 2019-04-25 DIAGNOSIS — K219 Gastro-esophageal reflux disease without esophagitis: Secondary | ICD-10-CM

## 2019-04-25 DIAGNOSIS — F329 Major depressive disorder, single episode, unspecified: Secondary | ICD-10-CM

## 2019-04-25 DIAGNOSIS — R21 Rash and other nonspecific skin eruption: Secondary | ICD-10-CM | POA: Diagnosis not present

## 2019-04-25 DIAGNOSIS — R059 Cough, unspecified: Secondary | ICD-10-CM

## 2019-04-25 DIAGNOSIS — L03116 Cellulitis of left lower limb: Secondary | ICD-10-CM

## 2019-04-25 DIAGNOSIS — F32A Depression, unspecified: Secondary | ICD-10-CM

## 2019-04-25 DIAGNOSIS — R05 Cough: Secondary | ICD-10-CM | POA: Diagnosis not present

## 2019-04-25 DIAGNOSIS — Z23 Encounter for immunization: Secondary | ICD-10-CM

## 2019-04-25 MED ORDER — HYDROCORTISONE 2.5 % EX OINT: TOPICAL_OINTMENT | Freq: Two times a day (BID) | CUTANEOUS | 0 refills | Status: DC

## 2019-04-25 MED ORDER — MUPIROCIN 2 % EX OINT: 1.0000 "application " | TOPICAL_OINTMENT | Freq: Three times a day (TID) | CUTANEOUS | 2 refills | Status: DC

## 2019-04-25 NOTE — Progress Notes (Signed)
Pre visit review using our clinic review tool, if applicable. No additional management support is needed unless otherwise documented below in the visit note. 

## 2019-04-25 NOTE — Progress Notes (Signed)
Chief Complaint  Patient presents with  . Cellulitis   F/u  1. Left leg lower ant shin with 2 cm wound s/p skin cancer removal healing slowly by secondary intention but slow to heal leg gets red, warm with redness around wound and bloody yellow discharge since had surgery 2 weeks ago  2. Sun damage Aks to chestand used 5 FU per dermatology x 2 weeks and 2 days not 3 weeks skin broken out and irritated and bleeding so she stopped the cream chest is also itching 3. H/o cough CT chest with hiatal hernia and esophagus changes will f/u GI 04/26/2019 may need further w/u I.e EGD 4. Depression stopped zoloft 25 mg debating if will resume   Review of Systems  Constitutional: Positive for malaise/fatigue. Negative for weight loss.  HENT: Negative for hearing loss.   Eyes: Negative for blurred vision.  Cardiovascular: Positive for leg swelling. Negative for chest pain.  Gastrointestinal: Negative for heartburn.  Skin: Positive for itching and rash.  Neurological: Negative for headaches.  Psychiatric/Behavioral: Positive for depression.   Past Medical History:  Diagnosis Date  . Acid reflux   . Allergy   . Anemia   . Anxiety    in past  . Arthritis    all over  . Atypical moles   . Bronchitis   . Bronchospasm    reactive with perfume/cologne and smoke  . CAD (coronary artery disease)    patient denies on preop of 10/24   . Candida esophagitis ([place])   . Carotid artery calcification, bilateral 04/2016  . Cataract   . Cervical stenosis of spine   . Chronic insomnia   . Complication of anesthesia    shoulder surgery- not all the way asleep  . COPD (chronic obstructive pulmonary disease) (HCC)    chronic bronchitis   . Depression    in past  . Diverticulitis   . Diverticulosis   . Dizziness   . Elevated liver enzymes   . Family history of adverse reaction to anesthesia    sister- has problems with nausea and vomiting   . Fatigue   . Fatty liver   . Generalized headache    migraines  . GERD (gastroesophageal reflux disease)   . History of chemotherapy    topical cream for h/o skin CA  . History of kidney stones   . History of neuropathy    FH Dr. Posey Pronto neurology  . History of vertigo   . Hx of migraines   . Hyperlipidemia   . Hypothyroidism   . Insomnia   . Irritable bowel syndrome   . LLQ abdominal pain   . Melanoma ([place]) 2013  . Melanoma ([place])    x2 stg II melanoma  . Neuropathy, peripheral   . Peripheral neuropathy   . Pneumonia    hx of x 2  . PONV (postoperative nausea and vomiting)   . Pre-diabetes   . Retina disorder    left  . SCC (squamous cell carcinoma)    skin   . Stroke [facility])    ? TIA per Dr V per patient   . Thyroid disease   . Urine incontinence   . Vitamin D deficiency   . Weight gain    Past Surgical History:  Procedure Laterality Date  . BILATERAL SALPINGOOPHORECTOMY     ovarian cyst Dr. Ouida Sills and Dr. Alisia Ferrari 2008  . BREAST SURGERY     in 2014 bx   . BREAST SURGERY     augmentation  with saline Dr. Hubbard Hartshorn 1995   . CHOLECYSTECTOMY N/A 01/28/2015   Procedure: LAPAROSCOPIC CHOLECYSTECTOMY WITH INTRAOPERATIVE CHOLANGIOGRAM;  Surgeon: Autumn Messing III, MD;  Location: [place];  Service: General;  Laterality: N/A;  . CLOSED MANIPULATION SHOULDER     rt  . COLONOSCOPY WITH PROPOFOL N/A 11/20/2012   Procedure: COLONOSCOPY WITH PROPOFOL;  Surgeon: Garlan Fair, MD;  Location: [facility];  Service: Endoscopy;  Laterality: N/A;  . colonscopy     May 2014  . JOINT REPLACEMENT     RTK  . KNEE ARTHROSCOPY    . left knee miniscus tear repair    . MELANOMA EXCISION WITH SENTINEL LYMPH NODE BIOPSY  03/18/11   Back, nodes neg  . OOPHORECTOMY    . ROTATOR CUFF REPAIR     left  . SHOULDER SURGERY     right and left   . TOTAL HIP ARTHROPLASTY    . TOTAL KNEE ARTHROPLASTY     right 2010 and left in 2018   . TOTAL KNEE ARTHROPLASTY Left 05/16/2017   Procedure: LEFT TOTAL KNEE ARTHROPLASTY;  Surgeon: Paralee Cancel, MD;   Location: [facility];  Service: Orthopedics;  Laterality: Left;  70 mins  . TUBAL LIGATION    . TUMOR REMOVAL     ovary   Family History  Problem Relation Age of Onset  . Hypertension Mother   . Stroke Mother   . Hyperlipidemia Mother   . Heart disease Father   . Diabetes Father   . Hypertension Father   . Hyperlipidemia Father   . Early death Father   . Diabetes Sister   . Heart disease Sister        heart murmur  . Arthritis Sister   . COPD Sister   . Hyperlipidemia Sister   . Hypertension Sister   . Pancreatic cancer Maternal Uncle   . Alzheimer's disease Paternal Aunt   . Colon cancer Maternal Grandfather   . Cancer Maternal Grandfather   . Early death Maternal Grandfather   . Alzheimer's disease Paternal Aunt   . Alzheimer's disease Paternal Aunt   . Colon polyps Sister        adenomous  . Arthritis Sister   . Heart disease Sister   . Hyperlipidemia Sister   . Hypertension Sister   . Cancer Sister   . Heart disease Daughter   . Hyperlipidemia Daughter   . Hypertension Daughter   . Heart disease Son   . Hyperlipidemia Son   . Alcohol abuse Son   . Depression Son   . Diabetes Son   . Stroke Maternal Grandmother   . Early death Paternal Grandmother   . Stroke Paternal Grandmother   . Cancer Other        2 paternal uncles and 1 maternal uncle colon cancer   Social History   Socioeconomic History  . Marital status: Divorced    Spouse name: Not on file  . Number of children: 3  . Years of education: College  . Highest education level: Some college, no degree  Occupational History  . Occupation: Retired    Fish farm manager: RETIRED  Social Needs  . Financial resource strain: Not on file  . Food insecurity    Worry: Not on file    Inability: Not on file  . Transportation needs    Medical: Not on file    Non-medical: Not on file  Tobacco Use  . Smoking status: Never Smoker  . Smokeless tobacco: Never Used  Substance  and Sexual Activity  . Alcohol use: Yes     Alcohol/week: 1.0 standard drinks    Types: 1 Cans of beer per week    Comment: once a week beer or wine  . Drug use: No  . Sexual activity: Not Currently  Lifestyle  . Physical activity    Days per week: Not on file    Minutes per session: Not on file  . Stress: Not on file  Relationships  . Social Herbalist on phone: Not on file    Gets together: Not on file    Attends religious service: Not on file    Active member of club or organization: Not on file    Attends meetings of clubs or organizations: Not on file    Relationship status: Not on file  . Intimate partner violence    Fear of current or ex partner: Not on file    Emotionally abused: Not on file    Physically abused: Not on file    Forced sexual activity: Not on file  Other Topics Concern  . Not on file  Social History Narrative   Patient lives at home alone in a one story home.   Has 3 children (2 daughters and 1 son)    Retired from [employer]   Divorced since 1992    Caffeine Use: 1 cup daily   No guns    Wears seat belt    Lives with small dog    Current Meds  Medication Sig  . albuterol (PROAIR HFA) 108 (90 Base) MCG/ACT inhaler ProAir HFA 90 mcg/actuation aerosol inhaler  INHALE 1 2 PUFFS INTO THE LUNGS EVERY 6 (SIX) HOURS AS NEEDED FOR WHEEZING OR SHORTNESS OF BREATH.  Marland Kitchen Apoaequorin (PREVAGEN) 10 MG CAPS Take by mouth.  . Ascorbic Acid (VITAMIN C) 1000 MG tablet Take 1,000 mg by mouth daily.  Marland Kitchen aspirin 81 MG tablet TAKE 1 TABLET BY MOUTH EVERY DAY  . atorvastatin (LIPITOR) 10 MG tablet Take 10 mg daily by mouth.   . Calcium 600-200 MG-UNIT tablet Take 1 tablet daily by mouth.  . Cholecalciferol (VITAMIN D3) 2000 UNITS capsule Take 2,000 Units by mouth daily.  . Coenzyme Q10 (COQ10) 30 MG CAPS Take by mouth.  . diclofenac sodium (VOLTAREN) 1 % GEL Voltaren 1 % topical gel  APPLY 4 GRAMS TO THE AFFECTED AREA(S) BY TOPICAL ROUTE 4 TIMES PER DAY  . doxycycline (VIBRA-TABS) 100 MG  tablet Take 1 tablet (100 mg total) by mouth 2 (two) times daily. With food  . furosemide (LASIX) 20 MG tablet Take 1 tablet (20 mg total) by mouth daily. In am for leg swelling  . levothyroxine (SYNTHROID) 50 MCG tablet Take 1 tablet (50 mcg total) by mouth daily before breakfast.  . mirabegron ER (MYRBETRIQ) 25 MG TB24 tablet Take 1 tablet (25 mg total) by mouth daily.  . Multiple Vitamins-Minerals (PRESERVISION AREDS 2 PO) Take 1 capsule by mouth daily.  . mupirocin ointment (BACTROBAN) 2 % Apply 1 application topically 3 (three) times daily.  . Omega-3 Fatty Acids (FISH OIL) 1000 MG CAPS Take 1,000 mg by mouth daily.   Marland Kitchen omeprazole (PRILOSEC) 20 MG capsule Take 1 capsule (20 mg total) by mouth 2 (two) times daily before a meal.  . sertraline (ZOLOFT) 25 MG tablet Take 1 tablet (25 mg total) by mouth daily.  . vitamin B-12 (CYANOCOBALAMIN) 1000 MCG tablet Take 2,000 mcg daily by mouth. 2 tabs  . [DISCONTINUED] mupirocin  ointment (BACTROBAN) 2 % Apply 1 application topically 3 (three) times daily.   Allergies  Allergen Reactions  . Epinephrine Other (See Comments) and Anaphylaxis    Heart palpitations  . Penicillins Swelling and Other (See Comments)    Lip swelling Has patient had a PCN reaction causing immediate rash, facial/tongue/throat swelling, SOB or lightheadedness with hypotension: Yes Has patient had a PCN reaction causing severe rash involving mucus membranes or skin necrosis: No Has patient had a PCN reaction that required hospitalization No Has patient had a PCN reaction occurring within the last 10 years: No If all of the above answers are "NO", then may proceed with Cephalosporin use.  . Lidocaine Hcl Other (See Comments)  . Other     Black pepper- caused bronchial tubes to start closing   . Valium [Diazepam] Other (See Comments)    Other reaction(s): Other (See Comments) Overly sensitive  sedation   Recent Results (from the past 2160 hour(s))  Novel Coronavirus, NAA  ([facility])     Status: None   Collection Time: 02/08/19 12:00 AM  Result Value Ref Range   SARS-CoV-2, NAA Not Detected Not Detected    Comment: Testing was performed using the cobas(R) SARS-CoV-2 test. This test was developed and its performance characteristics determined by [employer]. This test has not been FDA cleared or approved. This test has been authorized by FDA under an Emergency Use Authorization (EUA). This test is only authorized for the duration of time the declaration that circumstances exist justifying the authorization of the emergency use of in vitro diagnostic tests for detection of SARS-CoV-2 virus and/or diagnosis of COVID-19 infection under section 564(b)(1) of the Act, 21 U.S.C. 956OZH-0(Q)(6), unless the authorization is terminated or revoked sooner. When diagnostic testing is negative, the possibility of a false negative result should be considered in the context of a patient's recent exposures and the presence of clinical signs and symptoms consistent with COVID-19. An individual without symptoms of COVID-19 and who is not shedding SARS-CoV-2 virus would expect to have a negati ve (not detected) result in this assay.   Novel Coronavirus, NAA ([facility])     Status: None   Collection Time: 03/05/19 12:00 AM   Specimen: Oropharyngeal(OP) collection in vial transport medium   OROPHARYNGEA  TESTING  Result Value Ref Range   SARS-CoV-2, NAA Not Detected Not Detected    Comment: Testing was performed using the cobas(R) SARS-CoV-2 test. This test was developed and its performance characteristics determined by [employer]. This test has not been FDA cleared or approved. This test has been authorized by FDA under an Emergency Use Authorization (EUA). This test is only authorized for the duration of time the declaration that circumstances exist justifying the authorization of the emergency use of in vitro diagnostic tests for detection of SARS-CoV-2  virus and/or diagnosis of COVID-19 infection under section 564(b)(1) of the Act, 21 U.S.C. 578ION-6(E)(9), unless the authorization is terminated or revoked sooner. When diagnostic testing is negative, the possibility of a false negative result should be considered in the context of a patient's recent exposures and the presence of clinical signs and symptoms consistent with COVID-19. An individual without symptoms of COVID-19 and who is not shedding SARS-CoV-2 virus would expect to have a negati ve (not detected) result in this assay.    Objective  Body mass index is 31.76 kg/m. Wt Readings from Last 3 Encounters:  04/25/19 185 lb (83.9 kg)  02/19/19 188 lb (85.3 kg)  01/29/19 186 lb (84.4 kg)  Temp Readings from Last 3 Encounters:  04/25/19 97.7 F (36.5 C) (Oral)  01/25/19 98.4 F (36.9 C) (Skin)  12/28/18 97.9 F (36.6 C) (Oral)   BP Readings from Last 3 Encounters:  04/25/19 128/68  01/29/19 (!) 140/46  01/25/19 130/78   Pulse Readings from Last 3 Encounters:  04/25/19 74  01/29/19 63  01/25/19 74    Physical Exam Vitals signs and nursing note reviewed.  Constitutional:      Appearance: Normal appearance. She is well-developed and well-groomed.     Comments: +mask on    HENT:     Head: Normocephalic and atraumatic.  Eyes:     Conjunctiva/sclera: Conjunctivae normal.     Pupils: Pupils are equal, round, and reactive to light.  Cardiovascular:     Rate and Rhythm: Normal rate and regular rhythm.     Heart sounds: Normal heart sounds. No murmur.  Pulmonary:     Effort: Pulmonary effort is normal.     Breath sounds: Normal breath sounds.  Chest:     Comments: Skin rash 2/2 5FU red and inflammed and peeling   Skin:    General: Skin is warm and dry.       Neurological:     General: No focal deficit present.     Mental Status: She is alert and oriented to person, place, and time. Mental status is at baseline.     Gait: Gait normal.  Psychiatric:         Attention and Perception: Attention and perception normal.        Mood and Affect: Mood and affect normal.        Speech: Speech normal.        Behavior: Behavior normal. Behavior is cooperative.        Thought Content: Thought content normal.        Cognition and Memory: Cognition and memory normal.        Judgment: Judgment normal.     Assessment  Plan  Left leg cellulitis - Plan: mupirocin ointment (BACTROBAN) 2 % Doxy bid x 10 days f/u derm surgery in [place] if not healing   Skin rash 2/2/ 5FU - Plan: hydrocortisone 2.5 % ointment, mupirocin ointment (BACTROBAN) 2 %   Hiatal hernia Gastroesophageal reflux disease, unspecified whether esophagitis present Cough F/U GI 10/9/202 was having diarrhea so address as well if still having  Depression, unspecified depression type  Stopped zoloft 25 trying to decide if will resume   HM Flu shot given today 04/2019  Tdap-had TD 10/18/12 not pertussis will need this in future shingrixwill need  zostervax had 06/16/11 prevnarhad 05/15/14 Pna 23had 12/16/05, 05/18/16 -[facility] my [facility]  Pap out of age window LMP [place] with implants6/30/2020 normal -10/18/16 screening with implants neg FH breast cancer p cousins dx'ed in 39s x 74, sister Had at age 75 y.o and colon cancer age 69, 2 paternal cousins ovarian cancer age 25 and 15   Colonoscopy 11/2012 tortuous colon no further rec. -Consider cologuard in future if no h/o polypsordered today 02/19/2019 still not done as of 04/03/2019 DEXA -[facility] 06/28/16 normal reviewed report did not scan into chart -h/o vitamin D def on D3 2000 IU daily  Prediabetes A1C 08/15/18 6.110/2019 60 CBC normal 08/15/18, CMET Cr. 1.0 GFR 54 (slightly low), UA rare bacteria, TSH 1.524   [facility] saw 01/01/19 increased nocturia and urgency pending Botox to tx and UDS Dr. Junious Silk and try myrbetriq  before UDS  Cards-Dr. Rockey Situ  GI [facility]  GI  Eye MD Dr. Cordelia Pen [facility]-retinal specialist  Dentist Dr. Lynelle Smoke  Neurology-[facility]  ENT-Dr. Tami Ribas for vestibular balance issue s Podiatry Dr. Milinda Pointer  H[facility]o [facility]   Dermatology Dr. Delman Cheadle in [place] h/o [facility] left leg and MM right leg,neck and backwith node dissection Dr. Marlou Starks 8/12 ortho Dr. Alvan Dame  Former PCP Dr. Jossie Ng requested labs, all vaccines, DEXA -pt brought in all records week of 09/21/2018 and reviewed  -h/o iron def anemia  -last seen 07/09/18 and 08/09/18 possible thrush and URI  US carotid 05/13/16 <50% stenosis in right and left ICA -she was at this time put on lipitor 10 mg qhs   CT ab/pelvis 07/26/17 left base scarring lung, well circumcised liver lesion likely cysts and stable hepatomegaly 19.4 cm cranlocaudal pancreas/spleen normal, extensive colonic diverticulosis, aortic and branch vessell atherosclerosis no adenopathy, normal uterus and adnexa, advanced LS spondylosis grade 1 L4/5 and L5/S1 anterolisthesis are similar, pelvic floor laxity, hiatal hernia  CXR 02/26/18 thoracic aorta mildly atherosclerotic unchanged, Deg changes T spine, Deg changes b/l shoulders prior right shoulder surgery calcification distal left supraspinatus tendon at its insertion on the great tuberosity of the left humerus   UA normal 07/09/18  04/23/18 A1c 6.0  04/23/18 CBC 6.2/14.2/42.9/plts 199  04/23/18 CMET Cr 0.88 BUN 20 EGFR 62, total bili chronically elevated 1.1 (prior direct bili low 0.1 likely unconjugated high) AST15, ALT 14 normal  04/23/18 lipidTC 155, TG 143, HDL 68, LDL 58 10/17/17 vitamin D 41.1 04/18/16 33.7  TSH 06/29/17 2.06  FOBT neg x 3 05/19/16   Provider: Dr. Olivia Mackie McLean-Scocuzza-Internal Medicine

## 2019-04-25 NOTE — Patient Instructions (Signed)

## 2019-04-25 NOTE — Telephone Encounter (Signed)
Patient has been informed at appointment today.

## 2019-04-26 ENCOUNTER — Encounter: Payer: Self-pay | Admitting: Gastroenterology

## 2019-04-26 ENCOUNTER — Ambulatory Visit: Payer: PPO | Admitting: Gastroenterology

## 2019-04-26 VITALS — BP 122/76 | HR 76 | Temp 97.4°F | Ht 64.0 in | Wt 184.0 lb

## 2019-04-26 DIAGNOSIS — R194 Change in bowel habit: Secondary | ICD-10-CM

## 2019-04-26 DIAGNOSIS — Z1211 Encounter for screening for malignant neoplasm of colon: Secondary | ICD-10-CM

## 2019-04-26 DIAGNOSIS — K219 Gastro-esophageal reflux disease without esophagitis: Secondary | ICD-10-CM | POA: Diagnosis not present

## 2019-04-26 NOTE — Progress Notes (Signed)
HPI :  78 year old female here for follow-up visit.  She is been followed by our office for history of GERD and esophageal candidiasis in the past.  She states a few months ago she had some persistent loose stools that was bothering her for a few weeks.  She had frequent loose bowel movements associated with frequent nausea and vomiting.  She states it went on for a few weeks but then has improved over time.  During this time she had no fevers.  She denied any abdominal pains.  She did not have any stool testing.  She states in recent weeks her bowels have considerably improved.  She has gone to perhaps 2 bowel movements per day which is her normal frequency, about half of the time her stools are a bit looser than others.  She has some mild urgency with this but much better than before.  No blood in her stools.  She has not tried much of anything to treat this yet.  She was started on Mirabegron around the time symptoms started, and she has continued with this medication.  She denies any new changes in her medicines prior to onset of symptoms.  She was given a Cologuard test otherwise for screening for colon cancer.  She states she has had 4 colonoscopies in the past, she has never had successful cecal intubation due to severe tortuosity.  Her last colonoscopy was in 2014 by [facility].  She had to be admitted to the hospital for pain after the procedure and concern for perforation, she was told she should never have a colonoscopy again.  She states she will never undergo another screening colonoscopy.  Otherwise she has been taking omeprazole 20 mg twice a day for her reflux.  She generally states this is working quite well in controlling her symptoms.  She denies any dysphagia.  She had an EGD with me in March 2018 which showed a 3 cm hiatal hernia and esophageal candidiasis.  The rest of her exam was normal.  No Barrett's esophagus.  CT scan 07/26/2017 IMPRESSION: 1. No acute process in the abdomen or  pelvis. No evidence of metastatic disease in the abdomen or pelvis. 2.  Aortic Atherosclerosis (ICD10-I70.0). 3. Pelvic floor laxity. 4. Hiatal hernia.   Past Medical History:  Diagnosis Date  . Acid reflux   . Allergy   . Anemia   . Anxiety    in past  . Arthritis    all over  . Atypical moles   . Bronchitis   . Bronchospasm    reactive with perfume/cologne and smoke  . CAD (coronary artery disease)    patient denies on preop of 10/24   . Candida esophagitis ([place])   . Carotid artery calcification, bilateral 04/2016  . Cataract   . Cervical stenosis of spine   . Chronic insomnia   . Complication of anesthesia    shoulder surgery- not all the way asleep  . COPD (chronic obstructive pulmonary disease) (HCC)    chronic bronchitis   . Depression    in past  . Diverticulitis   . Diverticulosis   . Dizziness   . Elevated liver enzymes   . Family history of adverse reaction to anesthesia    sister- has problems with nausea and vomiting   . Fatigue   . Fatty liver   . Generalized headache    migraines  . GERD (gastroesophageal reflux disease)   . History of chemotherapy    topical cream for  h/o skin CA  . History of kidney stones   . History of neuropathy    [facility] Dr. Posey Pronto neurology  . History of vertigo   . Hx of migraines   . Hyperlipidemia   . Hypothyroidism   . Insomnia   . Irritable bowel syndrome   . LLQ abdominal pain   . Melanoma ([place]) 2013  . Melanoma ([place])    x2 stg II melanoma  . Neuropathy, peripheral   . Peripheral neuropathy   . Pneumonia    hx of x 2  . PONV (postoperative nausea and vomiting)   . Pre-diabetes   . Retina disorder    left  . SCC (squamous cell carcinoma)    skin   . Stroke [facility])    ? TIA per Dr V per patient   . Thyroid disease   . Urine incontinence   . Vitamin D deficiency   . Weight gain      Past Surgical History:  Procedure Laterality Date  . BILATERAL SALPINGOOPHORECTOMY     ovarian cyst Dr. Ouida Sills and Dr.  Alisia Ferrari 2008  . BREAST SURGERY     in 2014 bx   . BREAST SURGERY     augmentation with saline Dr. Hubbard Hartshorn 1995   . CHOLECYSTECTOMY N/A 01/28/2015   Procedure: LAPAROSCOPIC CHOLECYSTECTOMY WITH INTRAOPERATIVE CHOLANGIOGRAM;  Surgeon: Autumn Messing III, MD;  Location: [place];  Service: General;  Laterality: N/A;  . CLOSED MANIPULATION SHOULDER     rt  . COLONOSCOPY WITH PROPOFOL N/A 11/20/2012   Procedure: COLONOSCOPY WITH PROPOFOL;  Surgeon: Garlan Fair, MD;  Location: [facility];  Service: Endoscopy;  Laterality: N/A;  . colonscopy     May 2014  . JOINT REPLACEMENT     RTK  . KNEE ARTHROSCOPY    . left knee miniscus tear repair    . MELANOMA EXCISION WITH SENTINEL LYMPH NODE BIOPSY  03/18/11   Back, nodes neg  . OOPHORECTOMY    . ROTATOR CUFF REPAIR     left  . SHOULDER SURGERY     right and left   . TOTAL HIP ARTHROPLASTY    . TOTAL KNEE ARTHROPLASTY     right 2010 and left in 2018   . TOTAL KNEE ARTHROPLASTY Left 05/16/2017   Procedure: LEFT TOTAL KNEE ARTHROPLASTY;  Surgeon: Paralee Cancel, MD;  Location: [facility];  Service: Orthopedics;  Laterality: Left;  70 mins  . TUBAL LIGATION    . TUMOR REMOVAL     ovary   Family History  Problem Relation Age of Onset  . Hypertension Mother   . Stroke Mother   . Hyperlipidemia Mother   . Heart disease Father   . Diabetes Father   . Hypertension Father   . Hyperlipidemia Father   . Early death Father   . Diabetes Sister   . Heart disease Sister        heart murmur  . Arthritis Sister   . COPD Sister   . Hyperlipidemia Sister   . Hypertension Sister   . Pancreatic cancer Maternal Uncle   . Alzheimer's disease Paternal Aunt   . Colon cancer Maternal Grandfather   . Cancer Maternal Grandfather   . Early death Maternal Grandfather   . Alzheimer's disease Paternal Aunt   . Alzheimer's disease Paternal Aunt   . Colon polyps Sister        adenomous  . Arthritis Sister   . Heart disease Sister   . Hyperlipidemia Sister    .  Hypertension Sister   . Cancer Sister   . Heart disease Daughter   . Hyperlipidemia Daughter   . Hypertension Daughter   . Heart disease Son   . Hyperlipidemia Son   . Alcohol abuse Son   . Depression Son   . Diabetes Son   . Stroke Maternal Grandmother   . Early death Paternal Grandmother   . Stroke Paternal Grandmother   . Cancer Other        2 paternal uncles and 1 maternal uncle colon cancer   Social History   Tobacco Use  . Smoking status: Never Smoker  . Smokeless tobacco: Never Used  Substance Use Topics  . Alcohol use: Yes    Alcohol/week: 1.0 standard drinks    Types: 1 Cans of beer per week    Comment: once a week beer or wine  . Drug use: No   Current Outpatient Medications  Medication Sig Dispense Refill  . albuterol (PROAIR HFA) 108 (90 Base) MCG/ACT inhaler ProAir HFA 90 mcg/actuation aerosol inhaler  INHALE 1 2 PUFFS INTO THE LUNGS EVERY 6 (SIX) HOURS AS NEEDED FOR WHEEZING OR SHORTNESS OF BREATH.    Marland Kitchen Ascorbic Acid (VITAMIN C) 1000 MG tablet Take 1,000 mg by mouth daily.    Marland Kitchen aspirin 81 MG tablet TAKE 1 TABLET BY MOUTH EVERY DAY    . atorvastatin (LIPITOR) 10 MG tablet Take 10 mg daily by mouth.     . Calcium 600-200 MG-UNIT tablet Take 1 tablet daily by mouth.    . Cholecalciferol (VITAMIN D3) 2000 UNITS capsule Take 2,000 Units by mouth daily.    . Coenzyme Q10 (COQ10) 30 MG CAPS Take by mouth.    . diclofenac sodium (VOLTAREN) 1 % GEL Voltaren 1 % topical gel  APPLY 4 GRAMS TO THE AFFECTED AREA(S) BY TOPICAL ROUTE 4 TIMES PER DAY    . doxycycline (VIBRA-TABS) 100 MG tablet Take 1 tablet (100 mg total) by mouth 2 (two) times daily. With food 20 tablet 0  . hydrocortisone 2.5 % ointment Apply topically 2 (two) times daily. 60 g 0  . levothyroxine (SYNTHROID) 50 MCG tablet Take 1 tablet (50 mcg total) by mouth daily before breakfast. 30 tablet 1  . mirabegron ER (MYRBETRIQ) 25 MG TB24 tablet Take 1 tablet (25 mg total) by mouth daily. 30 tablet   .  Multiple Vitamins-Minerals (PRESERVISION AREDS 2 PO) Take 1 capsule by mouth daily.    . mupirocin ointment (BACTROBAN) 2 % Apply 1 application topically 3 (three) times daily. 60 g 2  . Omega-3 Fatty Acids (FISH OIL) 1000 MG CAPS Take 1,000 mg by mouth daily.     Marland Kitchen omeprazole (PRILOSEC) 20 MG capsule Take 1 capsule (20 mg total) by mouth 2 (two) times daily before a meal. 60 capsule 3  . vitamin B-12 (CYANOCOBALAMIN) 1000 MCG tablet Take 2,000 mcg daily by mouth. 2 tabs     No current facility-administered medications for this visit.    Allergies  Allergen Reactions  . Epinephrine Other (See Comments) and Anaphylaxis    Heart palpitations  . Penicillins Swelling and Other (See Comments)    Lip swelling Has patient had a PCN reaction causing immediate rash, facial/tongue/throat swelling, SOB or lightheadedness with hypotension: Yes Has patient had a PCN reaction causing severe rash involving mucus membranes or skin necrosis: No Has patient had a PCN reaction that required hospitalization No Has patient had a PCN reaction occurring within the last 10 years: No If all of the  above answers are "NO", then may proceed with Cephalosporin use.  . Lidocaine Hcl Other (See Comments)  . Other     Black pepper- caused bronchial tubes to start closing   . Valium [Diazepam] Other (See Comments)    Other reaction(s): Other (See Comments) Overly sensitive  sedation     Review of Systems: All systems reviewed and negative except where noted in HPI.   Lab Results  Component Value Date   WBC 5.3 12/05/2018   HGB 14.0 12/05/2018   HCT 40.7 12/05/2018   MCV 92.2 12/05/2018   PLT 216.0 12/05/2018    Lab Results  Component Value Date   CREATININE 0.88 12/05/2018   BUN 22 12/05/2018   NA 141 12/05/2018   K 4.0 12/05/2018   CL 105 12/05/2018   CO2 28 12/05/2018    Lab Results  Component Value Date   ALT 12 12/05/2018   AST 10 12/05/2018   ALKPHOS 66 12/05/2018   BILITOT 1.5 (H)  12/05/2018    Physical Exam: BP 122/76   Pulse 76   Temp (!) 97.4 F (36.3 C)   Ht 5\' 4"  (1.626 m)   Wt 184 lb (83.5 kg)   BMI 31.58 kg/m  Constitutional: Pleasant,well-developed, female in no acute distress. HEENT: Normocephalic and atraumatic. Conjunctivae are normal. No scleral icterus. Neck supple.  Cardiovascular: Normal rate, regular rhythm.  Pulmonary/chest: Effort normal and breath sounds normal. No wheezing, rales or rhonchi. Abdominal: Soft, nondistended, nontender. There are no masses palpable. No hepatomegaly. Extremities: no edema Lymphadenopathy: No cervical adenopathy noted. Neurological: Alert and oriented to person place and time. Skin: Skin is warm and dry. No rashes noted. Psychiatric: Normal mood and affect. Behavior is normal.   ASSESSMENT AND PLAN: 78 year old female here for reassessment of the following issues:  Change in bowel habits - development of a few weeks worth of loose stools with urgency and associated nausea and vomiting which has since considerably improved.  Stool frequency is normal, having intermittent loose stools, no further nausea vomiting.  No alarm symptoms.  She feels much better, we discussed differential diagnosis. Mirabegron can be associated with change in bowel habits although this is not common.  She could have had a self-limited infectious etiology which is since resolved.  I recommend she try some low-dose Imodium as needed initially to see if this helps.  If symptoms persist or worsen moving forward she will contact me for reassessment, as below she adamantly is against a colonoscopy at this time.  Colon cancer screening - she has never had cecal intubation successful on prior colonoscopies, her last exam was limited by post procedure pain which led to admission.  She asks if she should complete the Cologuard test that was sent to her.  She has no alarm symptoms and no anemia.  Given she is over the age of 88, she does not warrant  any further colon cancer screening.  Further, given she has never had a successful colonoscopy and has had complications from prior attempts, I would not recommend performing a colonoscopy or a Cologuard.  Discussed what Cologuard is, she agreed that given she never wants a colonoscopy she will not perform the Cologuard.  GERD - longstanding symptoms, no evidence of Barrett's esophagus.  We discussed long-term risks and benefits of chronic PPI use.  I recommend she reduce the dose of her omeprazole to 20 mg once a day to see if she maintains good control of symptoms on a lower dose.  If for  some reason she does not tolerate it and has breakthrough she can increase to twice a day again as needed.  She agreed  [place] Cellar, MD [facility]

## 2019-04-28 DIAGNOSIS — L03116 Cellulitis of left lower limb: Secondary | ICD-10-CM | POA: Diagnosis not present

## 2019-04-29 DIAGNOSIS — S81802D Unspecified open wound, left lower leg, subsequent encounter: Secondary | ICD-10-CM | POA: Diagnosis not present

## 2019-05-03 ENCOUNTER — Other Ambulatory Visit: Payer: Self-pay | Admitting: Pulmonary Disease

## 2019-05-09 ENCOUNTER — Ambulatory Visit: Payer: PPO | Admitting: Internal Medicine

## 2019-05-09 ENCOUNTER — Other Ambulatory Visit: Payer: Self-pay

## 2019-05-10 ENCOUNTER — Ambulatory Visit (INDEPENDENT_AMBULATORY_CARE_PROVIDER_SITE_OTHER): Payer: PPO | Admitting: Internal Medicine

## 2019-05-10 ENCOUNTER — Ambulatory Visit
Admission: RE | Admit: 2019-05-10 | Discharge: 2019-05-10 | Disposition: A | Discharge status: Home / Self Care | Payer: PPO | Source: Ambulatory Visit | Attending: Internal Medicine | Admitting: Internal Medicine

## 2019-05-10 ENCOUNTER — Other Ambulatory Visit: Payer: Self-pay

## 2019-05-10 VITALS — BP 136/84 | HR 73 | Temp 97.8°F | Ht 64.0 in | Wt 183.8 lb

## 2019-05-10 DIAGNOSIS — S81802D Unspecified open wound, left lower leg, subsequent encounter: Secondary | ICD-10-CM

## 2019-05-10 DIAGNOSIS — M79605 Pain in left leg: Secondary | ICD-10-CM | POA: Diagnosis not present

## 2019-05-10 DIAGNOSIS — M79662 Pain in left lower leg: Secondary | ICD-10-CM | POA: Diagnosis not present

## 2019-05-10 NOTE — Progress Notes (Addendum)
Chief Complaint  Patient presents with   Follow-up   1. F/u left wound not healing 1 month after bx oozing yellow discharge and painful and red surrounding leg is hurting and swollen and c/o calf pain went to [facility] 04/28/19 and given levaquin and Xray of her left leg to r/o OM 2. Grief 1 of her friends just died 2019/05/16    Review of Systems  Constitutional: Negative for weight loss.  HENT: Negative for hearing loss.   Eyes: Negative for blurred vision.  Cardiovascular: Positive for leg swelling. Negative for chest pain.  Skin: Positive for rash.       +left leg wound   Psychiatric/Behavioral: Negative for depression.   Past Medical History:  Diagnosis Date   Acid reflux    Allergy    Anemia    Anxiety    in past   Arthritis    all over   Atypical moles    Bronchitis    Bronchospasm    reactive with perfume/cologne and smoke   CAD (coronary artery disease)    patient denies on preop of 10/24    Candida esophagitis (HCC)    Carotid artery calcification, bilateral May 15, 2016   Cataract    Cervical stenosis of spine    Chronic insomnia    Complication of anesthesia    shoulder surgery- not all the way asleep   COPD (chronic obstructive pulmonary disease) (HCC)    chronic bronchitis    Depression    in past   Diverticulitis    Diverticulosis    Dizziness    Elevated liver enzymes    Family history of adverse reaction to anesthesia    sister- has problems with nausea and vomiting    Fatigue    Fatty liver    Generalized headache    migraines   GERD (gastroesophageal reflux disease)    History of chemotherapy    topical cream for h/o skin CA   History of kidney stones    History of neuropathy    FH Dr. Posey Pronto neurology   History of vertigo    Hx of migraines    Hyperlipidemia    Hypothyroidism    Insomnia    Irritable bowel syndrome    LLQ abdominal pain    Melanoma ([place]) 2013   Melanoma ([place])    x2 stg II melanoma     Neuropathy, peripheral    Peripheral neuropathy    Pneumonia    hx of x 2   PONV (postoperative nausea and vomiting)    Pre-diabetes    Retina disorder    left   SCC (squamous cell carcinoma)    skin    Stroke ([place])    ? TIA per Dr V per patient    Thyroid disease    Urine incontinence    Vitamin D deficiency    Weight gain    Past Surgical History:  Procedure Laterality Date   BILATERAL SALPINGOOPHORECTOMY     ovarian cyst Dr. Ouida Sills and Dr. Alisia Ferrari 2008   BREAST SURGERY     in 2014 bx    BREAST SURGERY     augmentation with saline Dr. Hubbard Hartshorn 1995    CHOLECYSTECTOMY N/A 01/28/2015   Procedure: LAPAROSCOPIC CHOLECYSTECTOMY WITH INTRAOPERATIVE CHOLANGIOGRAM;  Surgeon: Autumn Messing III, MD;  Location: [place];  Service: General;  Laterality: N/A;   CLOSED MANIPULATION SHOULDER     rt   COLONOSCOPY WITH PROPOFOL N/A 11/20/2012   Procedure: COLONOSCOPY WITH PROPOFOL;  Surgeon: Garlan Fair, MD;  Location: [facility];  Service: Endoscopy;  Laterality: N/A;   colonscopy     May 2014   JOINT REPLACEMENT     RTK   KNEE ARTHROSCOPY     left knee miniscus tear repair     MELANOMA EXCISION WITH SENTINEL LYMPH NODE BIOPSY  03/18/11   Back, nodes neg   OOPHORECTOMY     ROTATOR CUFF REPAIR     left   SHOULDER SURGERY     right and left    TOTAL HIP ARTHROPLASTY     TOTAL KNEE ARTHROPLASTY     right 2010 and left in 2018    [place] Left 05/16/2017   Procedure: LEFT TOTAL KNEE ARTHROPLASTY;  Surgeon: Paralee Cancel, MD;  [facility];  Service: Orthopedics;  Laterality: Left;  70 mins   TUBAL LIGATION     TUMOR REMOVAL     ovary   Family History  Problem Relation Age of Onset   Hypertension Mother    Stroke Mother    Hyperlipidemia Mother    Heart disease Father    Diabetes Father    Hypertension Father    Hyperlipidemia Father    Early death Father    Diabetes Sister    Heart disease Sister         heart murmur   Arthritis Sister    COPD Sister    Hyperlipidemia Sister    Hypertension Sister    Pancreatic cancer Maternal Uncle    Alzheimer's disease Paternal Aunt    Colon cancer Maternal Grandfather    Cancer Maternal Grandfather    Early death Maternal Grandfather    Alzheimer's disease Paternal Aunt    Alzheimer's disease Paternal Aunt    Colon polyps Sister        adenomous   Arthritis Sister    Heart disease Sister    Hyperlipidemia Sister    Hypertension Sister    Cancer Sister    Heart disease Daughter    Hyperlipidemia Daughter    Hypertension Daughter    Heart disease Son    Hyperlipidemia Son    Alcohol abuse Son    Depression Son    Diabetes Son    Stroke Maternal Grandmother    Early death Paternal Grandmother    Stroke Paternal Grandmother    Cancer Other        2 paternal uncles and 1 maternal uncle colon cancer   Social History   Socioeconomic History   Marital status: Divorced    Spouse name: Not on file   Number of children: 3   Years of education: Secretary/administrator   Highest education level: Some college, no degree  Occupational History   Occupation: Retired    Fish farm manager: RETIRED  Scientist, product/process development strain: Not on file   Food insecurity    Worry: Not on file    Inability: Not on Lexicographer needs    Medical: Not on file    Non-medical: Not on file  Tobacco Use   Smoking status: Never Smoker   Smokeless tobacco: Never Used  Substance and Sexual Activity   Alcohol use: Yes    Alcohol/week: 1.0 standard drinks    Types: 1 Cans of beer per week    Comment: once a week beer or wine   Drug use: No   Sexual activity: Not Currently  Lifestyle   Physical activity    Days per week: Not on  file    Minutes per session: Not on file   Stress: Not on file  Relationships   Social connections    Talks on phone: Not on file    Gets together: Not on file    Attends religious service:  Not on file    Active member of club or organization: Not on file    Attends meetings of clubs or organizations: Not on file    Relationship status: Not on file   Intimate partner violence    Fear of current or ex partner: Not on file    Emotionally abused: Not on file    Physically abused: Not on file    Forced sexual activity: Not on file  Other Topics Concern   Not on file  Social History Narrative   Patient lives at home alone in a one story home.   Has 3 children (2 daughters and 1 son)    Retired from [employer]   Divorced since 1992    Caffeine Use: 1 cup daily   No guns    Wears seat belt    Lives with small dog    Current Meds  Medication Sig   albuterol (PROAIR HFA) 108 (90 Base) MCG/ACT inhaler ProAir HFA 90 mcg/actuation aerosol inhaler  INHALE 1 2 PUFFS INTO THE LUNGS EVERY 6 (SIX) HOURS AS NEEDED FOR WHEEZING OR SHORTNESS OF BREATH.   Ascorbic Acid (VITAMIN C) 1000 MG tablet Take 1,000 mg by mouth daily.   aspirin 81 MG tablet TAKE 1 TABLET BY MOUTH EVERY DAY   atorvastatin (LIPITOR) 10 MG tablet Take 10 mg daily by mouth.    Calcium 600-200 MG-UNIT tablet Take 1 tablet daily by mouth.   Cholecalciferol (VITAMIN D3) 2000 UNITS capsule Take 2,000 Units by mouth daily.   Coenzyme Q10 (COQ10) 30 MG CAPS Take by mouth.   diclofenac sodium (VOLTAREN) 1 % GEL Voltaren 1 % topical gel  APPLY 4 GRAMS TO THE AFFECTED AREA(S) BY TOPICAL ROUTE 4 TIMES PER DAY   hydrocortisone 2.5 % ointment Apply topically 2 (two) times daily.   levothyroxine (SYNTHROID) 50 MCG tablet Take 1 tablet (50 mcg total) by mouth daily before breakfast.   mirabegron ER (MYRBETRIQ) 25 MG TB24 tablet Take 1 tablet (25 mg total) by mouth daily.   Multiple Vitamins-Minerals (PRESERVISION AREDS 2 PO) Take 1 capsule by mouth daily.   mupirocin ointment (BACTROBAN) 2 % Apply 1 application topically 3 (three) times daily.   Omega-3 Fatty Acids (FISH OIL) 1000 MG CAPS Take 1,000  mg by mouth daily.    vitamin B-12 (CYANOCOBALAMIN) 1000 MCG tablet Take 2,000 mcg daily by mouth. 2 tabs   [DISCONTINUED] doxycycline (VIBRA-TABS) 100 MG tablet Take 1 tablet (100 mg total) by mouth 2 (two) times daily. With food   Allergies  Allergen Reactions   Epinephrine Other (See Comments) and Anaphylaxis    Heart palpitations   Penicillins Swelling and Other (See Comments)    Lip swelling Has patient had a PCN reaction causing immediate rash, facial/tongue/throat swelling, SOB or lightheadedness with hypotension: Yes Has patient had a PCN reaction causing severe rash involving mucus membranes or skin necrosis: No Has patient had a PCN reaction that required hospitalization No Has patient had a PCN reaction occurring within the last 10 years: No If all of the above answers are "NO", then may proceed with Cephalosporin use.   Lidocaine Hcl Other (See Comments)   Other     Black pepper- caused bronchial  tubes to start closing    Valium [Diazepam] Other (See Comments)    Other reaction(s): Other (See Comments) Overly sensitive  sedation   Recent Results (from the past 2160 hour(s))  Novel Coronavirus, NAA ([facility])     Status: None   Collection Time: 03/05/19 12:00 AM   Specimen: Oropharyngeal(OP) collection in vial transport medium   OROPHARYNGEA  TESTING  Result Value Ref Range   SARS-CoV-2, NAA Not Detected Not Detected    Comment: Testing was performed using the cobas(R) SARS-CoV-2 test. This test was developed and its performance characteristics determined by [employer]. This test has not been FDA cleared or approved. This test has been authorized by FDA under an Emergency Use Authorization (EUA). This test is only authorized for the duration of time the declaration that circumstances exist justifying the authorization of the emergency use of in vitro diagnostic tests for detection of SARS-CoV-2 virus and/or diagnosis of COVID-19 infection under section  564(b)(1) of the Act, 21 U.S.C. 583ENM-0(H)(6), unless the authorization is terminated or revoked sooner. When diagnostic testing is negative, the possibility of a false negative result should be considered in the context of a patient's recent exposures and the presence of clinical signs and symptoms consistent with COVID-19. An individual without symptoms of COVID-19 and who is not shedding SARS-CoV-2 virus would expect to have a negati ve (not detected) result in this assay.    Objective  Body mass index is 31.55 kg/m. Wt Readings from Last 3 Encounters:  05/10/19 183 lb 12.8 oz (83.4 kg)  04/26/19 184 lb (83.5 kg)  04/25/19 185 lb (83.9 kg)   Temp Readings from Last 3 Encounters:  05/10/19 97.8 F (36.6 C) (Skin)  04/26/19 (!) 97.4 F (36.3 C)  04/25/19 97.7 F (36.5 C) (Oral)   BP Readings from Last 3 Encounters:  05/10/19 136/84  04/26/19 122/76  04/25/19 128/68   Pulse Readings from Last 3 Encounters:  05/10/19 73  04/26/19 76  04/25/19 74    Physical Exam Vitals signs and nursing note reviewed.  Constitutional:      Appearance: Normal appearance. She is well-developed and well-groomed.     Comments: +mask on    HENT:     Head: Normocephalic and atraumatic.  Eyes:     Conjunctiva/sclera: Conjunctivae normal.     Pupils: Pupils are equal, round, and reactive to light.  Cardiovascular:     Rate and Rhythm: Normal rate and regular rhythm.     Heart sounds: Normal heart sounds. No murmur.  Pulmonary:     Effort: Pulmonary effort is normal.     Breath sounds: Normal breath sounds.  Skin:    General: Skin is warm and dry.          Comments: 2-2.5 cm shin wound oozing yellow with red surrounding    Neurological:     General: No focal deficit present.     Mental Status: She is alert and oriented to person, place, and time. Mental status is at baseline.     Gait: Gait normal.  Psychiatric:        Attention and Perception: Attention and perception normal.         Mood and Affect: Mood and affect normal.        Speech: Speech normal.        Behavior: Behavior normal. Behavior is cooperative.        Thought Content: Thought content normal.        Cognition and Memory: Cognition and  memory normal.        Judgment: Judgment normal.     Assessment  Plan  Left leg pain - Plan: US Venous Img Lower Unilateral Left r/o DVT   Wound of left lower extremity, subsequent encounter - Plan: Ambulatory referral to Plastic Surgery Dr. Lyndee Leo [place] for wound healing  [facility]/10/[facility] [address] [place] surgery left mid lower leg SCC 2.0 cm x 1.7 cm Mohs surgery Dr. Drema Pry referred by Dr. Delman Cheadle who did bx  S/p 04/10/19 mohs surgery left to heal by 2nd intention and electrocoagulation was used dressed with vasoline telfa, pressure dressing    HM Flu shot given 04/2019 Tdap-had TD 10/18/12 not pertussis will need this in future shingrixwill need  zostervax had 06/16/11 prevnarhad 05/15/14 Pna 23had 12/16/05, 05/18/16 -[facility] vaccines in my note  Pap out of age window LMP [place] with implants6/30/2020 normal -10/18/16 screening with implants neg FH breast cancer p cousins dx'ed in 77s x 16, sister Had at age 32 y.o and colon cancer age 31, 2 paternal cousins ovarian cancer age 47 and 95   Colonoscopy 11/2012 tortuous colon no further rec. -Consider cologuard in future if no h/o polypsordered today 8/4/2020still not done as of 04/03/2019 DEXA -[facility] 06/28/16 normal reviewed report did not scan into chart -h/o vitamin D def on D3 2000 IU daily  Prediabetes A1C 08/15/18 6.110/2019 60 CBC normal 08/15/18, CMET Cr. 1.0 GFR 54 (slightly low), UA rare bacteria, TSH 1.524    [facility] 04[facility]/2019 Dr. Delman Cheadle mohs surgery  F/u from 04/10/19 left lower leg cellulitis for SCC changed to levaquin rec compression socks and acidic acid soaks qhs    05/15/19 f/u appt s/p SCC mohs removal f/u in 3 months Dr. Winifred Olive   [facility] saw 01/01/19 increased nocturia and urgency pending Botox to tx and UDS Dr. Junious Silk and try myrbetriq before UDS  Cards-Dr. Rockey Situ  [facility]  Eye MD Dr. Cordelia Pen [facility]-retinal specialist  Dentist Dr. Lynelle Smoke  Neurology-[facility]  ENT-Dr. Tami Ribas for vestibular balance issue s Podiatry Dr. Milinda Pointer  H/o D.[facility]   Dermatology Dr. Delman Cheadle in [place] h/o [facility] left leg and MM right leg,neck and backwith node dissection Dr. Marlou Starks 8/12 ortho Dr. Alvan Dame  Former PCP Dr. Jossie Ng requested labs, all vaccines, DEXA -pt brought in all records week of 09/21/2018 and reviewed  -h/o iron def anemia  -last seen 07/09/18 and 08/09/18 possible thrush and URI  US carotid 05/13/16 <50% stenosis in right and left ICA -she was at this time put on lipitor 10 mg qhs   CT ab/pelvis 07/26/17 left base scarring lung, well circumcised liver lesion likely cysts and stable hepatomegaly 19.4 cm cranlocaudal pancreas/spleen normal, extensive colonic diverticulosis, aortic and branch vessell atherosclerosis no adenopathy, normal uterus and adnexa, advanced LS spondylosis grade 1 L4/5 and L5/S1 anterolisthesis are similar, pelvic floor laxity, hiatal hernia  CXR 02/26/18 thoracic aorta mildly atherosclerotic unchanged, Deg changes T spine, Deg changes b/l shoulders prior right shoulder surgery calcification distal left supraspinatus tendon at its insertion on the great tuberosity of the left humerus   UA normal 07/09/18  04/23/18 A1c 6.0  04/23/18 CBC 6.2/14.2/42.9/plts 199  04/23/18 CMET Cr 0.88 BUN 20 EGFR 62, total bili chronically elevated 1.1 (prior direct bili low 0.1 likely unconjugated high) AST15, ALT 14 normal  04/23/18 lipidTC 155, TG 143, HDL 68, LDL 58 10/17/17 vitamin D 41.1 04/18/16  33.7  TSH 06/29/17 2.06  FOBT neg x 3 05/19/16  Provider: Dr. Olivia Mackie Mc[facility]

## 2019-05-10 NOTE — Patient Instructions (Signed)
Dr. Theodoro Kos [place] plastic surgeon deals with wounds!  [phone] [address]  [place] [address]

## 2019-05-14 ENCOUNTER — Other Ambulatory Visit: Payer: Self-pay

## 2019-05-14 ENCOUNTER — Encounter: Payer: Self-pay | Admitting: Plastic Surgery

## 2019-05-14 ENCOUNTER — Ambulatory Visit: Payer: PPO | Admitting: Plastic Surgery

## 2019-05-14 DIAGNOSIS — Z719 Counseling, unspecified: Secondary | ICD-10-CM

## 2019-05-14 DIAGNOSIS — S81802A Unspecified open wound, left lower leg, initial encounter: Secondary | ICD-10-CM | POA: Diagnosis not present

## 2019-05-14 NOTE — Progress Notes (Signed)
Patient ID: [name], female    DOB: 03/02/1941, 78 y.o.   MRN: [id number]   Chief Complaint  Patient presents with  . Advice Only    for (L) lower leg wound  . Skin Problem    The patient is a 78 year old female here for evaluation of her left leg wound.  She underwent excision of a skin cancer of the left leg.  She had this done over a month ago.  She states that it has not healed and has continued to be an open tender wound.  She was using Vaseline on it but was told not to.  She has now been washing keeping it clean and dry.  The surrounding area looks healthy.  She has some mild swelling in her lower extremities but no sign of cellulitis.  She does have 1.5 cm red patch on the outer lower aspect of the left leg could be a skin cancer. I will continue to monitor it.  The wound is 2 cm.  I do not see any tendon exposed.  It does not appear to be infected.   Review of Systems  Constitutional: Positive for activity change.  Eyes: Negative.   Respiratory: Negative.   Cardiovascular: Negative.   Gastrointestinal: Negative.   Endocrine: Negative.   Genitourinary: Negative.   Musculoskeletal: Positive for gait problem.  Skin: Positive for color change and wound.  Hematological: Negative.   Psychiatric/Behavioral: Negative.     Past Medical History:  Diagnosis Date  . Acid reflux   . Allergy   . Anemia   . Anxiety    in past  . Arthritis    all over  . Atypical moles   . Bronchitis   . Bronchospasm    reactive with perfume/cologne and smoke  . CAD (coronary artery disease)    patient denies on preop of 10/24   . Candida esophagitis ([place])   . Carotid artery calcification, bilateral 04/2016  . Cataract   . Cervical stenosis of spine   . Chronic insomnia   . Complication of anesthesia    shoulder surgery- not all the way asleep  . COPD (chronic obstructive pulmonary disease) (HCC)    chronic bronchitis   . Depression    in past  . Diverticulitis   .  Diverticulosis   . Dizziness   . Elevated liver enzymes   . Family history of adverse reaction to anesthesia    sister- has problems with nausea and vomiting   . Fatigue   . Fatty liver   . Generalized headache    migraines  . GERD (gastroesophageal reflux disease)   . History of chemotherapy    topical cream for h/o skin CA  . History of kidney stones   . History of neuropathy    FH Dr. Posey Pronto neurology  . History of vertigo   . Hx of migraines   . Hyperlipidemia   . Hypothyroidism   . Insomnia   . Irritable bowel syndrome   . LLQ abdominal pain   . Melanoma ([place]) 2013  . Melanoma ([place])    x2 stg II melanoma  . Neuropathy, peripheral   . Peripheral neuropathy   . Pneumonia    hx of x 2  . PONV (postoperative nausea and vomiting)   . Pre-diabetes   . Retina disorder    left  . SCC (squamous cell carcinoma)    skin   . Stroke [facility])    ? TIA per Dr  V per patient   . Thyroid disease   . Urine incontinence   . Vitamin D deficiency   . Weight gain     Past Surgical History:  Procedure Laterality Date  . BILATERAL SALPINGOOPHORECTOMY     ovarian cyst Dr. Ouida Sills and Dr. Alisia Ferrari 2008  . BREAST SURGERY     in 2014 bx   . BREAST SURGERY     augmentation with saline Dr. Hubbard Hartshorn 1995   . CHOLECYSTECTOMY N/A 01/28/2015   Procedure: LAPAROSCOPIC CHOLECYSTECTOMY WITH INTRAOPERATIVE CHOLANGIOGRAM;  Surgeon: Autumn Messing III, MD;  Location: [place];  Service: General;  Laterality: N/A;  . CLOSED MANIPULATION SHOULDER     rt  . COLONOSCOPY WITH PROPOFOL N/A 11/20/2012   Procedure: COLONOSCOPY WITH PROPOFOL;  Surgeon: Garlan Fair, MD;  [facility];  Service: Endoscopy;  Laterality: N/A;  . colonscopy     May 2014  . JOINT REPLACEMENT     RTK  . KNEE ARTHROSCOPY    . left knee miniscus tear repair    . MELANOMA EXCISION WITH SENTINEL LYMPH NODE BIOPSY  03/18/11   Back, nodes neg  . OOPHORECTOMY    . ROTATOR CUFF REPAIR     left  . SHOULDER SURGERY      right and left   . TOTAL HIP ARTHROPLASTY    . TOTAL KNEE ARTHROPLASTY     right 2010 and left in 2018   . TOTAL KNEE ARTHROPLASTY Left 05/16/2017   Procedure: LEFT TOTAL KNEE ARTHROPLASTY;  Surgeon: Paralee Cancel, MD;  [facility];  Service: Orthopedics;  Laterality: Left;  70 mins  . TUBAL LIGATION    . TUMOR REMOVAL     ovary      Current Outpatient Medications:  .  albuterol (PROAIR HFA) 108 (90 Base) MCG/ACT inhaler, ProAir HFA 90 mcg/actuation aerosol inhaler  INHALE 1 2 PUFFS INTO THE LUNGS EVERY 6 (SIX) HOURS AS NEEDED FOR WHEEZING OR SHORTNESS OF BREATH., Disp: , Rfl:  .  Ascorbic Acid (VITAMIN C) 1000 MG tablet, Take 1,000 mg by mouth daily., Disp: , Rfl:  .  aspirin 81 MG tablet, TAKE 1 TABLET BY MOUTH EVERY DAY, Disp: , Rfl:  .  atorvastatin (LIPITOR) 10 MG tablet, Take 10 mg daily by mouth. , Disp: , Rfl:  .  Calcium 600-200 MG-UNIT tablet, Take 1 tablet daily by mouth., Disp: , Rfl:  .  Cholecalciferol (VITAMIN D3) 2000 UNITS capsule, Take 2,000 Units by mouth daily., Disp: , Rfl:  .  Coenzyme Q10 (COQ10) 30 MG CAPS, Take by mouth., Disp: , Rfl:  .  levothyroxine (SYNTHROID) 50 MCG tablet, Take 1 tablet (50 mcg total) by mouth daily before breakfast., Disp: 30 tablet, Rfl: 1 .  mirabegron ER (MYRBETRIQ) 25 MG TB24 tablet, Take 1 tablet (25 mg total) by mouth daily., Disp: 30 tablet, Rfl:  .  Multiple Vitamins-Minerals (PRESERVISION AREDS 2 PO), Take 1 capsule by mouth daily., Disp: , Rfl:  .  Omega-3 Fatty Acids (FISH OIL) 1000 MG CAPS, Take 1,000 mg by mouth daily. , Disp: , Rfl:  .  vitamin B-12 (CYANOCOBALAMIN) 1000 MCG tablet, Take 2,000 mcg daily by mouth. 2 tabs, Disp: , Rfl:  .  omeprazole (PRILOSEC) 20 MG capsule, Take 1 capsule (20 mg total) by mouth 2 (two) times daily before a meal., Disp: 60 capsule, Rfl: 3   Objective:   Vitals:   05/14/19 1456  BP: (!) 148/71  Pulse: 80  Temp: (!) 96.9 F (  36.1 C)  SpO2: 93%    Physical Exam Vitals signs and  nursing note reviewed.  Constitutional:      Appearance: Normal appearance.  HENT:     Head: Normocephalic and atraumatic.  Neck:     Musculoskeletal: Normal range of motion.  Cardiovascular:     Rate and Rhythm: Normal rate.     Pulses: Normal pulses.  Pulmonary:     Effort: Pulmonary effort is normal.  Musculoskeletal:        General: Swelling and tenderness present.       Legs:  Neurological:     General: No focal deficit present.     Mental Status: She is alert and oriented to person, place, and time.  Psychiatric:        Mood and Affect: Mood normal.        Behavior: Behavior normal.     Assessment & Plan:  Wound of left lower extremity, initial encounter  We discussed options for intervention versus conservative treatment.  She is very frustrated with the length of time she has had the wound and would like to proceed with aggressive treatment.  I recommend Dr. Sharol Given silver socks while we get surgery set up.  She is in agreement. Plan on left leg wound with ACell placement. Pictures were obtained of the patient and placed in the chart with the patient's or guardian's permission.   [place], DO

## 2019-05-27 ENCOUNTER — Encounter: Payer: Self-pay | Admitting: Podiatry

## 2019-05-27 ENCOUNTER — Ambulatory Visit: Payer: PPO | Admitting: Podiatry

## 2019-05-27 ENCOUNTER — Other Ambulatory Visit: Payer: Self-pay

## 2019-05-27 DIAGNOSIS — M79676 Pain in unspecified toe(s): Secondary | ICD-10-CM

## 2019-05-27 DIAGNOSIS — B351 Tinea unguium: Secondary | ICD-10-CM

## 2019-05-27 NOTE — Progress Notes (Signed)
She presents today for chief complaint of painfully elongated toenails.  Objective: Pulses are palpable.  Nails are long thick yellow dystrophic onychomycotic painful palpation as well as debridement.  Porokeratotic lesion plantar right.  Assessment: Pain in limb second onychomycosis porokeratosis.  Plan: Debridement of toenails 1 through 5 bilateral.  Debridement of all porokeratotic lesions.  Remember to thank her for my mother's old photograph.

## 2019-05-30 ENCOUNTER — Other Ambulatory Visit: Payer: Self-pay

## 2019-05-30 ENCOUNTER — Ambulatory Visit (INDEPENDENT_AMBULATORY_CARE_PROVIDER_SITE_OTHER): Payer: PPO | Admitting: Surgical

## 2019-05-30 ENCOUNTER — Encounter: Payer: Self-pay | Admitting: Surgical

## 2019-05-30 VITALS — BP 117/76 | HR 64 | Temp 97.1°F | Ht 66.0 in | Wt 184.2 lb

## 2019-05-30 DIAGNOSIS — S81802A Unspecified open wound, left lower leg, initial encounter: Secondary | ICD-10-CM

## 2019-05-30 NOTE — Progress Notes (Signed)
Subjective:     Patient ID: [name], female    DOB: June 07, 1941, 78 y.o.   MRN: [id number]  Chief Complaint  Patient presents with  . Pre-op Exam    HPI: The patient is a 78 y.o. female who was here for pre-op evaluation for LLE skin wound s/p Mohs Resection. Since her last visit, her wound has improved since wearing the compression socks.  She has been applying Vaseline daily and wearing a bandage under the compression socks.  Today her left leg wound is 1 x 0.8 cm and flush with the surrounding skin.  She has epithelialized a portion of this wound.  There is no sign of periwound infection. There is minimal exudate noted on her bandage.   She does report that she had cellulitis in this leg prior to her Mohs resection and she does not feel it has completely resolved.  On exam today she is quite tender along the anterior and posterior portion of her left lower extremity.  She reports that this is not new and it has been tender for quite some time now, greater than a month.  She has not noticed any increased redness or swelling.  Denies fever, chills, n/v  Review of Systems  Constitutional: Positive for activity change. Negative for appetite change, chills, diaphoresis, fatigue and fever.  Respiratory: Positive for shortness of breath (chronic, stable). Negative for cough and wheezing.   Cardiovascular: Positive for leg swelling. Negative for chest pain and palpitations.  Gastrointestinal: Negative for abdominal pain, diarrhea, nausea and vomiting.  Genitourinary: Negative.   Musculoskeletal: Positive for gait problem and myalgias. Negative for arthralgias.  Skin: Positive for color change and wound.  Neurological: Negative for dizziness, weakness, numbness and headaches.     Objective:   Vital Signs BP 117/76 (BP Location: Left Arm, Patient Position: Sitting, Cuff Size: Large)   Pulse 64   Temp (!) 97.1 F (36.2 C) (Temporal)   Ht 5\' 6"  (1.676 m)   Wt 184 lb 3.2 oz (83.6  kg)   SpO2 97%   BMI 29.73 kg/m  Vital Signs and Nursing Note Reviewed  Physical Exam  Constitutional: She is oriented to person, place, and time and well-developed, well-nourished, and in no distress.  HENT:  Head: Normocephalic and atraumatic.  Neck: Normal range of motion. Neck supple.  Cardiovascular: Normal rate.  Pulmonary/Chest: Effort normal. No respiratory distress.  Abdominal: Soft. She exhibits no distension.  Musculoskeletal: Normal range of motion.        General: Tenderness (L) and edema (L) present.       Legs:  Neurological: She is alert and oriented to person, place, and time.  Skin: Skin is warm and dry. No rash noted. She is not diaphoretic. There is erythema. No pallor.  Psychiatric: Mood and affect normal.   Assessment/Plan:     ICD-10-CM   1. Wound of left lower extremity, initial encounter  S81.802A    Mrs. Singhal is progressing in her wound healing and would prefer to not move forward with surgery at this time. Spoke with Dr. Marla Roe and based on her progress and her improvement, cancel surgery for 06/05/19.  Donated Acell applied to LLE wound with adaptic, KY jelly, gauze, kerlix. Continue to apply KY jelly daily for 3 days then shower on Monday and continue with daily vaseline.   Call with questions or concerns. Follow up in 1 week.     Carola Rhine Azuree Minish, PA-C 05/30/2019, 4:55 PM

## 2019-05-31 ENCOUNTER — Encounter (HOSPITAL_COMMUNITY): Payer: Self-pay | Admitting: Emergency Medicine

## 2019-05-31 ENCOUNTER — Emergency Department (HOSPITAL_COMMUNITY)
Admission: EM | Admit: 2019-05-31 | Discharge: 2019-05-31 | Disposition: A | Discharge status: Home / Self Care | Payer: PPO | Attending: Emergency Medicine | Admitting: Emergency Medicine

## 2019-05-31 ENCOUNTER — Encounter: Payer: Self-pay | Admitting: Surgical

## 2019-05-31 ENCOUNTER — Emergency Department (HOSPITAL_COMMUNITY): Payer: PPO

## 2019-05-31 ENCOUNTER — Ambulatory Visit: Payer: Self-pay | Admitting: *Deleted

## 2019-05-31 ENCOUNTER — Other Ambulatory Visit: Payer: Self-pay

## 2019-05-31 DIAGNOSIS — Z79899 Other long term (current) drug therapy: Secondary | ICD-10-CM | POA: Diagnosis not present

## 2019-05-31 DIAGNOSIS — M79605 Pain in left leg: Secondary | ICD-10-CM | POA: Diagnosis not present

## 2019-05-31 DIAGNOSIS — J449 Chronic obstructive pulmonary disease, unspecified: Secondary | ICD-10-CM | POA: Insufficient documentation

## 2019-05-31 DIAGNOSIS — Z96651 Presence of right artificial knee joint: Secondary | ICD-10-CM | POA: Diagnosis not present

## 2019-05-31 DIAGNOSIS — R0602 Shortness of breath: Secondary | ICD-10-CM | POA: Diagnosis not present

## 2019-05-31 DIAGNOSIS — Z85828 Personal history of other malignant neoplasm of skin: Secondary | ICD-10-CM | POA: Insufficient documentation

## 2019-05-31 DIAGNOSIS — Z8673 Personal history of transient ischemic attack (TIA), and cerebral infarction without residual deficits: Secondary | ICD-10-CM | POA: Insufficient documentation

## 2019-05-31 DIAGNOSIS — E039 Hypothyroidism, unspecified: Secondary | ICD-10-CM | POA: Diagnosis not present

## 2019-05-31 DIAGNOSIS — Z96652 Presence of left artificial knee joint: Secondary | ICD-10-CM | POA: Diagnosis not present

## 2019-05-31 DIAGNOSIS — I2510 Atherosclerotic heart disease of native coronary artery without angina pectoris: Secondary | ICD-10-CM | POA: Diagnosis not present

## 2019-05-31 DIAGNOSIS — M7989 Other specified soft tissue disorders: Secondary | ICD-10-CM | POA: Diagnosis not present

## 2019-05-31 DIAGNOSIS — Z7982 Long term (current) use of aspirin: Secondary | ICD-10-CM | POA: Diagnosis not present

## 2019-05-31 LAB — CBC WITH DIFFERENTIAL/PLATELET
Abs Immature Granulocytes: 0.02 10*3/uL (ref 0.00–0.07)
Basophils Absolute: 0 10*3/uL (ref 0.0–0.1)
Basophils Relative: 0 %
Eosinophils Absolute: 0.1 10*3/uL (ref 0.0–0.5)
Eosinophils Relative: 2 %
HCT: 44.8 % (ref 36.0–46.0)
Hemoglobin: 14.5 g/dL (ref 12.0–15.0)
Immature Granulocytes: 0 %
Lymphocytes Relative: 28 %
Lymphs Abs: 1.4 10*3/uL (ref 0.7–4.0)
MCH: 29.8 pg (ref 26.0–34.0)
MCHC: 32.4 g/dL (ref 30.0–36.0)
MCV: 92.2 fL (ref 80.0–100.0)
Monocytes Absolute: 0.4 10*3/uL (ref 0.1–1.0)
Monocytes Relative: 9 %
Neutro Abs: 3.1 10*3/uL (ref 1.7–7.7)
Neutrophils Relative %: 61 %
Platelets: 202 10*3/uL (ref 150–400)
RBC: 4.86 MIL/uL (ref 3.87–5.11)
RDW: 13 % (ref 11.5–15.5)
WBC: 5.1 10*3/uL (ref 4.0–10.5)
nRBC: 0 % (ref 0.0–0.2)

## 2019-05-31 LAB — COMPREHENSIVE METABOLIC PANEL
ALT: 17 U/L (ref 0–44)
AST: 22 U/L (ref 15–41)
Albumin: 4.1 g/dL (ref 3.5–5.0)
Alkaline Phosphatase: 70 U/L (ref 38–126)
Anion gap: 14 (ref 5–15)
BUN: 16 mg/dL (ref 8–23)
CO2: 21 mmol/L — ABNORMAL LOW (ref 22–32)
Calcium: 9.4 mg/dL (ref 8.9–10.3)
Chloride: 106 mmol/L (ref 98–111)
Creatinine, Ser: 0.97 mg/dL (ref 0.44–1.00)
GFR calc Af Amer: >60 mL/min (ref >60)
GFR calc non Af Amer: 56 mL/min — ABNORMAL LOW (ref >60)
Glucose, Bld: 125 mg/dL — ABNORMAL HIGH (ref 70–99)
Potassium: 4.2 mmol/L (ref 3.5–5.1)
Sodium: 141 mmol/L (ref 135–145)
Total Bilirubin: 1.6 mg/dL — ABNORMAL HIGH (ref 0.3–1.2)
Total Protein: 6.7 g/dL (ref 6.5–8.1)

## 2019-05-31 LAB — LACTIC ACID, PLASMA: Lactic Acid, Venous: 1.7 mmol/L (ref 0.5–1.9)

## 2019-05-31 LAB — PROTIME-INR
INR: 1 (ref 0.8–1.2)
Prothrombin Time: 12.6 seconds (ref 11.4–15.2)

## 2019-05-31 MED ORDER — CLINDAMYCIN PHOSPHATE 600 MG/50ML IV SOLN
600.0000 mg | Freq: Once | INTRAVENOUS | Status: AC
  Administered 2019-05-31: 600 mg via INTRAVENOUS
  Filled 2019-05-31: qty 50

## 2019-05-31 MED ORDER — CLINDAMYCIN HCL 150 MG PO CAPS: 450.0000 mg | ORAL_CAPSULE | Freq: Three times a day (TID) | ORAL | 0 refills | Status: AC

## 2019-05-31 NOTE — Telephone Encounter (Signed)
If she needs to be on IV antibiotics she needs to go to [facility] or [facility]  Plastic surgeon Dr. Theodoro Kos Dillingham is addressing the wound care as well  Call plastic surgery to inform    Sabine

## 2019-05-31 NOTE — ED Notes (Signed)
ED Provider to D/C COVID Test

## 2019-05-31 NOTE — ED Triage Notes (Signed)
Pt reports left lower extremity cellulitis. She is being seen by wound care, new dressing with pig bladder placed yesterday. She is here to receive IV antibiotics because oral antibiotics have not worked. Hx of skin CA

## 2019-05-31 NOTE — Telephone Encounter (Signed)
Pt called in c/o her left leg being pink and very warm to the touch.  She is being treated for cellulitis in this leg.  She had a skin cancer surgery on the left leg that is not healing.   It is not swollen this morning because she has kept it elevated all night but it is swollen when not elevated.   She is also having needle sensations too. She has been getting treatment for this at the wound care center.   The doctor told her while there yesterday she needed to get in touch with her PCP because she may need to be on IV antibiotics.    Protocol was go to ED or PCP triage.   She let me know she really needed to be seen and wanted to come into the office since Dr. Aundra Dubin has been treating her for the infection in her leg.   She's the one that referred her to the wound care center.  "I don't want a virtual visit someone needs to see this leg".  I called into the office and spoke with Juliann Pulse regarding the situation.   Dr. Aundra Dubin is fully booked today.  At this point the pt wasn't agreeable to a virtual visit and that's the only appt available with another provider.  I got back on line with pt and let her know Dr. Aundra Dubin was fully booked today.   I agree her leg needs to be seen.   She went to the [facility] walk in clinic and "I had excellent care there".   "But I feel like I should be seen by my doctor since she has been treating me for this".    "I'm really getting disappointed with the [facility]".   "This is an urgent matter that needs to be seen by my doctor not a doctor at the walk in clinic".  "I've already been on 2 antibiotics for this and now the wound care doctor thinks I may need IV antibiotics".    "I can't believe this".       I know she had mentioned she did not want a virtual visit however I let her know that was the only appt available with another provider was a virtual.   Would she be willing to do that?   She said,   "Yes I will do that".    I warm transferred her call to Juliann Pulse to be  scheduled a virtual visit.    Pt thanked me for my help and understanding.      Reason for Disposition . Patient sounds very sick or weak to the triager  Answer Assessment - Initial Assessment Questions 1. ONSET: "When did the pain start?"      I had cellulitis in left leg.   I had skin cancer surgery on 04/10/2019.   This has been going on since then.   I've been to the wound care center.     I called and spoke with someone yesterday and they scheduled me for Monday but I need to be seen before then 2. LOCATION: "Where is the pain located?"      I'm having throbbing  and needles feeling.   It's not swollen.   I've been keeping it elevated.   It's pink and hot.    The doctor mentioned I may need IV treatment.   I've been on oral antibiotics.   The skin cancer surgery would not heal.    The wound care doctor yesterday told  me to call my PCP.   3. PAIN: "How bad is the pain?"    (Scale 1-10; or mild, moderate, severe)   -  MILD (1-3): doesn't interfere with normal activities    -  MODERATE (4-7): interferes with normal activities (e.g., work or school) or awakens from sleep, limping    -  SEVERE (8-10): excruciating pain, unable to do any normal activities, unable to walk     It's not hurting now but when it's being pressed on it makes me "jump off the table in the wound care center".   4. WORK OR EXERCISE: "Has there been any recent work or exercise that involved this part of the body?"      No 5. CAUSE: "What do you think is causing the leg pain?"     Infection from the cancer surgery.    I've had 2 ultrasounds at the hospital. 6. OTHER SYMPTOMS: "Do you have any other symptoms?" (e.g., chest pain, back pain, breathing difficulty, swelling, rash, fever, numbness, weakness)     My left leg is pink and red and painful. 7. PREGNANCY: "Is there any chance you are pregnant?" "When was your last menstrual period?"     N/A  Due to age  Protocols used: LEG PAIN-A-AH

## 2019-05-31 NOTE — Consult Note (Signed)
Reason for Consult: left leg wound  [name] is an 78 y.o. female.  HPI:  Mrs Cellucci is a 78 yo female who presented to the ED for evaluation of her left leg pain and swelling. She reported to me during evaluation that she presented to the ED today because her leg had hurt her so bad last night and that she felt it was very swollen compared to her baseline.   She is currently being treated at [facility] for her left leg wound. She was scheduled for surgery on 06/05/19 for this, but surgery was cancelled because her wound has improved.  No fever, chills, n/v today. Mild pitting edema with palpation.  Past Medical History:  Diagnosis Date  . Acid reflux   . Allergy   . Anemia   . Anxiety    in past  . Arthritis    all over  . Atypical moles   . Bronchitis   . Bronchospasm    reactive with perfume/cologne and smoke  . CAD (coronary artery disease)    patient denies on preop of 10/24   . Candida esophagitis ([facility])   . Carotid artery calcification, bilateral 04/2016  . Cataract   . Cervical stenosis of spine   . Chronic insomnia   . Complication of anesthesia    shoulder surgery- not all the way asleep  . COPD (chronic obstructive pulmonary disease) (HCC)    chronic bronchitis   . Depression    in past  . Diverticulitis   . Diverticulosis   . Dizziness   . Elevated liver enzymes   . Family history of adverse reaction to anesthesia    sister- has problems with nausea and vomiting   . Fatigue   . Fatty liver   . Generalized headache    migraines  . GERD (gastroesophageal reflux disease)   . History of chemotherapy    topical cream for h/o skin CA  . History of kidney stones   . History of neuropathy    [facility] Dr. Posey Pronto neurology  . History of vertigo   . Hx of migraines   . Hyperlipidemia   . Hypothyroidism   . Insomnia   . Irritable bowel syndrome   . LLQ abdominal pain   . Melanoma ([facility]) 2013  . Melanoma ([facility])    x2 stg II melanoma  . Neuropathy,  peripheral   . Peripheral neuropathy   . Pneumonia    hx of x 2  . PONV (postoperative nausea and vomiting)   . Pre-diabetes   . Retina disorder    left  . SCC (squamous cell carcinoma)    skin   . Stroke [facility])    ? TIA per Dr V per patient   . Thyroid disease   . Urine incontinence   . Vitamin D deficiency   . Weight gain     Past Surgical History:  Procedure Laterality Date  . BILATERAL SALPINGOOPHORECTOMY     ovarian cyst Dr. Ouida Sills and Dr. Alisia Ferrari 2008  . BREAST SURGERY     in 2014 bx   . BREAST SURGERY     augmentation with saline Dr. Hubbard Hartshorn 1995   . CHOLECYSTECTOMY N/A 01/28/2015   Procedure: LAPAROSCOPIC CHOLECYSTECTOMY WITH INTRAOPERATIVE CHOLANGIOGRAM;  Surgeon: Autumn Messing III, MD;  Location: [facility];  Service: General;  Laterality: N/A;  . CLOSED MANIPULATION SHOULDER     rt  . COLONOSCOPY WITH PROPOFOL N/A 11/20/2012   Procedure: COLONOSCOPY WITH PROPOFOL;  Surgeon:  Garlan Fair, MD;  Location: [facility];  Service: Endoscopy;  Laterality: N/A;  . colonscopy     May 2014  . JOINT REPLACEMENT     RTK  . KNEE ARTHROSCOPY    . left knee miniscus tear repair    . MELANOMA EXCISION WITH SENTINEL LYMPH NODE BIOPSY  03/18/11   Back, nodes neg  . OOPHORECTOMY    . ROTATOR CUFF REPAIR     left  . SHOULDER SURGERY     right and left   . TOTAL HIP ARTHROPLASTY    . TOTAL KNEE ARTHROPLASTY     right 2010 and left in 2018   . TOTAL KNEE ARTHROPLASTY Left 05/16/2017   Procedure: LEFT TOTAL KNEE ARTHROPLASTY;  Surgeon: Paralee Cancel, MD;  Location: [facility];  Service: Orthopedics;  Laterality: Left;  70 mins  . TUBAL LIGATION    . TUMOR REMOVAL     ovary    Family History  Problem Relation Age of Onset  . Hypertension Mother   . Stroke Mother   . Hyperlipidemia Mother   . Heart disease Father   . Diabetes Father   . Hypertension Father   . Hyperlipidemia Father   . Early death Father   . Diabetes Sister   . Heart disease Sister        heart murmur   . Arthritis Sister   . COPD Sister   . Hyperlipidemia Sister   . Hypertension Sister   . Pancreatic cancer Maternal Uncle   . Alzheimer's disease Paternal Aunt   . Colon cancer Maternal Grandfather   . Cancer Maternal Grandfather   . Early death Maternal Grandfather   . Alzheimer's disease Paternal Aunt   . Alzheimer's disease Paternal Aunt   . Colon polyps Sister        adenomous  . Arthritis Sister   . Heart disease Sister   . Hyperlipidemia Sister   . Hypertension Sister   . Cancer Sister   . Heart disease Daughter   . Hyperlipidemia Daughter   . Hypertension Daughter   . Heart disease Son   . Hyperlipidemia Son   . Alcohol abuse Son   . Depression Son   . Diabetes Son   . Stroke Maternal Grandmother   . Early death Paternal Grandmother   . Stroke Paternal Grandmother   . Cancer Other        2 paternal uncles and 1 maternal uncle colon cancer    Social History:  reports that she has never smoked. She has never used smokeless tobacco. She reports current alcohol use of about 1.0 standard drinks of alcohol per week. She reports that she does not use drugs.  Allergies:  Allergies  Allergen Reactions  . Epinephrine Other (See Comments) and Anaphylaxis    Heart palpitations  . Penicillins Swelling and Other (See Comments)    Lip swelling Has patient had a PCN reaction causing immediate rash, facial/tongue/throat swelling, SOB or lightheadedness with hypotension: Yes Has patient had a PCN reaction causing severe rash involving mucus membranes or skin necrosis: No Has patient had a PCN reaction that required hospitalization No Has patient had a PCN reaction occurring within the last 10 years: No If all of the above answers are "NO", then may proceed with Cephalosporin use.  . Lidocaine Hcl Other (See Comments)  . Other     Black pepper- caused bronchial tubes to start closing   . Valium [Diazepam] Other (See Comments)    Other reaction(s):  Other (See Comments) Overly  sensitive  sedation    Medications: I have reviewed the patient's current medications.  Results for orders placed or performed during the hospital encounter of 05/31/19 (from the past 48 hour(s))  Lactic acid, plasma     Status: None   Collection Time: 05/31/19 12:49 PM  Result Value Ref Range   Lactic Acid, Venous 1.7 0.5 - 1.9 mmol/L    Comment: Performed at [facility], [place] [address] [place], [place] [address]  Comprehensive metabolic panel     Status: Abnormal   Collection Time: 05/31/19 12:51 PM  Result Value Ref Range   Sodium 141 135 - 145 mmol/L   Potassium 4.2 3.5 - 5.1 mmol/L   Chloride 106 98 - 111 mmol/L   CO2 21 (L) 22 - 32 mmol/L   Glucose, Bld 125 (H) 70 - 99 mg/dL   BUN 16 8 - 23 mg/dL   Creatinine, Ser 0.97 0.44 - 1.00 mg/dL   Calcium 9.4 8.9 - 10.3 mg/dL   Total Protein 6.7 6.5 - 8.1 g/dL   Albumin 4.1 3.5 - 5.0 g/dL   AST 22 15 - 41 U/L   ALT 17 0 - 44 U/L   Alkaline Phosphatase 70 38 - 126 U/L   Total Bilirubin 1.6 (H) 0.3 - 1.2 mg/dL   GFR calc non Af Amer 56 (L) >60 mL/min   GFR calc Af Amer >60 >60 mL/min   Anion gap 14 5 - 15    Comment: Performed at [facility] [address] [place], [place] [address]  CBC with Differential     Status: None   Collection Time: 05/31/19 12:51 PM  Result Value Ref Range   WBC 5.1 4.0 - 10.5 K/uL   RBC 4.86 3.87 - 5.11 MIL/uL   Hemoglobin 14.5 12.0 - 15.0 g/dL   HCT 44.8 36.0 - 46.0 %   MCV 92.2 80.0 - 100.0 fL   MCH 29.8 26.0 - 34.0 pg   MCHC 32.4 30.0 - 36.0 g/dL   RDW 13.0 11.5 - 15.5 %   Platelets 202 150 - 400 K/uL   nRBC 0.0 0.0 - 0.2 %   Neutrophils Relative % 61 %   Neutro Abs 3.1 1.7 - 7.7 K/uL   Lymphocytes Relative 28 %   Lymphs Abs 1.4 0.7 - 4.0 K/uL   Monocytes Relative 9 %   Monocytes Absolute 0.4 0.1 - 1.0 K/uL   Eosinophils Relative 2 %   Eosinophils Absolute 0.1 0.0 - 0.5 K/uL   Basophils Relative 0 %   Basophils Absolute 0.0 0.0 - 0.1 K/uL   Immature Granulocytes 0 %    Abs Immature Granulocytes 0.02 0.00 - 0.07 K/uL    Comment: Performed at [facility], [address], [place], [place] [address]  Protime-INR     Status: None   Collection Time: 05/31/19 12:51 PM  Result Value Ref Range   Prothrombin Time 12.6 11.4 - 15.2 seconds   INR 1.0 0.8 - 1.2    Comment: (NOTE) INR goal varies based on device and disease states. Performed at [facility], [place] [address], [place], [place] [address]     Dg Chest 2 View  Result Date: 05/31/2019 CLINICAL DATA:  Cellulitis. Possible sepsis. Chronic shortness of breath. EXAM: CHEST - 2 VIEW COMPARISON:  CT chest 01/21/2019.  Chest x-ray 10/02/2018. FINDINGS: Mediastinum and hilar structures normal. Lungs are clear. No pleural effusion or pneumothorax. Surgical clips  right upper quadrant. Degenerative change thoracic spine. Surgical clips right shoulder. IMPRESSION: No acute cardiopulmonary disease. Electronically Signed   By: Marcello Moores  Register   On: 05/31/2019 13:31   Dg Tibia/fibula Left  Result Date: 05/31/2019 CLINICAL DATA:  Concern for osteomyelitis. History of cellulitis. EXAM: LEFT TIBIA AND FIBULA - 2 VIEW COMPARISON:  None. FINDINGS: No osseous abnormality in the tibia fibula suggest acute infection. Total knee arthroplasty noted. Vascular calcifications in the soft tissues. IMPRESSION: 1. No radiographic evidence of osteomyelitis in the tibia or fibula. 2. Vascular calcifications in the soft tissues of the lower leg. Electronically Signed   By: Suzy Bouchard M.D.   On: 05/31/2019 13:26    Review of Systems  Constitutional: Negative for chills, fever and weight loss.  Respiratory: Negative.   Cardiovascular: Positive for leg swelling. Negative for chest pain and palpitations.  Gastrointestinal: Negative.   Genitourinary: Negative.   Musculoskeletal: Positive for myalgias (left leg pain ).  Skin:       + wound   Neurological: Negative for dizziness and headaches.   Blood pressure (!)  157/69, pulse (!) 58, temperature 98.6 F (37 C), temperature source Oral, resp. rate 16, SpO2 98 %. Physical Exam  Constitutional: She is oriented to person, place, and time. She appears well-developed and well-nourished. No distress.  Cardiovascular: Normal rate.  Respiratory: Effort normal.  Musculoskeletal:        General: Tenderness and edema present.  Neurological: She is alert and oriented to person, place, and time.  Skin: Skin is warm and dry. No rash noted. She is not diaphoretic.     LLE wound with epithelialization peripherally with Acell in place, yellowing and incorporating. No purulence, drainage noted. TTP along entire anterior portion of LLE distal to knee.  Psychiatric: She has a normal mood and affect. Judgment normal.    Assessment/Plan:  In regards to LLE wound, continue with daily KY jelly over adaptic mesh, 4x4 gauze, kerlix, ACE wrap. In 3 days, begin with Vaseline daily to LLE and cover with 4x4 gauzed, tape.  Carola Rhine Tatym Schermer, PA-C 05/31/2019, 5:09 PM

## 2019-05-31 NOTE — Telephone Encounter (Signed)
Called and advised patient to f PCP order ad she is gong o theER at [facility] advised patient to have someone else drive her that she should not drive patient stated she would due to she feels weak and dizzy.

## 2019-05-31 NOTE — ED Provider Notes (Signed)
[facility] EMERGENCY DEPARTMENT Provider Note   CSN: [id number] Arrival date & time: 05/31/19  1230     History   Chief Complaint Chief Complaint  Patient presents with  . Cellulitis    HPI [name] is a 78 y.o. female.     HPI  Patient presents today for left lower extremity throbbing that lasted about 10 minutes last night and concerned that she has worsening cellulitis.  Patient has had cellulitis over the last 2 to 3 months for which she has tried outpatient doxycycline and Levaquin, the last antibiotic course completed about 3 weeks ago.  She denies any fevers, chills, cough, shortness of breath, nausea, emesis.  Associated symptoms include a graft placed by plastic surgery yesterday for a skin lesion that was recently removed and increased pain there.  Nothing seems to make throbbing better but time, ambulating seems to make it worse.  Past Medical History:  Diagnosis Date  . Acid reflux   . Allergy   . Anemia   . Anxiety    in past  . Arthritis    all over  . Atypical moles   . Bronchitis   . Bronchospasm    reactive with perfume/cologne and smoke  . CAD (coronary artery disease)    patient denies on preop of 10/24   . Candida esophagitis ([place])   . Carotid artery calcification, bilateral 04/2016  . Cataract   . Cervical stenosis of spine   . Chronic insomnia   . Complication of anesthesia    shoulder surgery- not all the way asleep  . COPD (chronic obstructive pulmonary disease) (HCC)    chronic bronchitis   . Depression    in past  . Diverticulitis   . Diverticulosis   . Dizziness   . Elevated liver enzymes   . Family history of adverse reaction to anesthesia    sister- has problems with nausea and vomiting   . Fatigue   . Fatty liver   . Generalized headache    migraines  . GERD (gastroesophageal reflux disease)   . History of chemotherapy    topical cream for h/o skin CA  . History of kidney stones   . History of  neuropathy    FH Dr. Posey Pronto neurology  . History of vertigo   . Hx of migraines   . Hyperlipidemia   . Hypothyroidism   . Insomnia   . Irritable bowel syndrome   . LLQ abdominal pain   . Melanoma ([place]) 2013  . Melanoma ([place])    x2 stg II melanoma  . Neuropathy, peripheral   . Peripheral neuropathy   . Pneumonia    hx of x 2  . PONV (postoperative nausea and vomiting)   . Pre-diabetes   . Retina disorder    left  . SCC (squamous cell carcinoma)    skin   . Stroke [facility])    ? TIA per Dr V per patient   . Thyroid disease   . Urine incontinence   . Vitamin D deficiency   . Weight gain     Patient Active Problem List   Diagnosis Date Noted  . Leg wound, left 05/14/2019  . Chronic cough 02/19/2019  . Irritable bowel syndrome with diarrhea 02/19/2019  . Depression, recurrent ([place]) 02/19/2019  . Flushing 10/27/2018  . Aortic atherosclerosis ([place]) 10/10/2018  . Chest tightness 10/10/2018  . Idiopathic neuropathy 09/13/2018  . History of stroke 09/13/2018  . History of squamous cell carcinoma  of skin 09/13/2018  . History of depression 09/13/2018  . History of migraine 09/13/2018  . Fatty liver 09/13/2018  . GERD (gastroesophageal reflux disease) 09/13/2018  . History of memory loss 09/13/2018  . Prediabetes 09/13/2018  . Hiatal hernia 09/13/2018  . Female stress incontinence 05/08/2017  . Pain in pelvis 05/08/2017  . Coronary artery calcification seen on CAT scan 04/09/2017  . Carotid stenosis, asymptomatic, bilateral 04/09/2017  . Mixed hyperlipidemia 04/09/2017  . MCI (mild cognitive impairment) 12/17/2015  . Urinary frequency 08/09/2015  . Hypothyroidism 08/09/2015  . Syncope 08/08/2015  . Posterior vitreous detachment of right eye 07/23/2015  . Hereditary and idiopathic peripheral neuropathy 05/11/2015  . Epiretinal membrane, left 04/12/2012  . Nuclear cataract 04/12/2012  . Breast pain in female 07/18/2011  . Melanoma of back ([place]) 02/28/2011    Past  Surgical History:  Procedure Laterality Date  . BILATERAL SALPINGOOPHORECTOMY     ovarian cyst Dr. Ouida Sills and Dr. Alisia Ferrari 2008  . BREAST SURGERY     in 2014 bx   . BREAST SURGERY     augmentation with saline Dr. Hubbard Hartshorn 1995   . CHOLECYSTECTOMY N/A 01/28/2015   Procedure: LAPAROSCOPIC CHOLECYSTECTOMY WITH INTRAOPERATIVE CHOLANGIOGRAM;  Surgeon: Autumn Messing III, MD;  Location: [facility];  Service: General;  Laterality: N/A;  . CLOSED MANIPULATION SHOULDER     rt  . COLONOSCOPY WITH PROPOFOL N/A 11/20/2012   Procedure: COLONOSCOPY WITH PROPOFOL;  Surgeon: Garlan Fair, MD;  Location: [facility];  Service: Endoscopy;  Laterality: N/A;  . colonscopy     May 2014  . JOINT REPLACEMENT     RTK  . KNEE ARTHROSCOPY    . left knee miniscus tear repair    . MELANOMA EXCISION WITH SENTINEL LYMPH NODE BIOPSY  03/18/11   Back, nodes neg  . OOPHORECTOMY    . ROTATOR CUFF REPAIR     left  . SHOULDER SURGERY     right and left   . TOTAL HIP ARTHROPLASTY    . TOTAL KNEE ARTHROPLASTY     right 2010 and left in 2018   . TOTAL KNEE ARTHROPLASTY Left 05/16/2017   Procedure: LEFT TOTAL KNEE ARTHROPLASTY;  Surgeon: Paralee Cancel, MD;  Location: [facility];  Service: Orthopedics;  Laterality: Left;  70 mins  . TUBAL LIGATION    . TUMOR REMOVAL     ovary     OB History    Gravida  0   Para  0   Term  0   Preterm  0   AB  0   Living        SAB  0   TAB  0   Ectopic  0   Multiple      Live Births               Home Medications    Prior to Admission medications   Medication Sig Start Date End Date Taking? Authorizing Provider  albuterol (PROAIR HFA) 108 (90 Base) MCG/ACT inhaler ProAir HFA 90 mcg/actuation aerosol inhaler  INHALE 1 2 PUFFS INTO THE LUNGS EVERY 6 (SIX) HOURS AS NEEDED FOR WHEEZING OR SHORTNESS OF BREATH.    [provider]  Ascorbic Acid (VITAMIN C) 1000 MG tablet Take 1,000 mg by mouth daily.    [provider]  aspirin 81 MG tablet TAKE  1 TABLET BY MOUTH EVERY DAY 04/23/18   [provider]  atorvastatin (LIPITOR) 10 MG tablet Take 10 mg daily by mouth.  [provider]  Calcium 600-200 MG-UNIT tablet Take 1 tablet daily by mouth.    [provider]  Cholecalciferol (VITAMIN D3) 2000 UNITS capsule Take 2,000 Units by mouth daily.    [provider]  clindamycin (CLEOCIN) 150 MG capsule Take 3 capsules (450 mg total) by mouth 3 (three) times daily for 7 days. 05/31/19 06/07/19  Julianne Rice, MD  Coenzyme Q10 (COQ10) 30 MG CAPS Take by mouth.    [provider]  levothyroxine (SYNTHROID) 50 MCG tablet Take 1 tablet (50 mcg total) by mouth daily before breakfast. 08/10/15   Kelvin Cellar, MD  mirabegron ER (MYRBETRIQ) 25 MG TB24 tablet Take 1 tablet (25 mg total) by mouth daily. 10/03/18   McLean-Scocuzza, Nino Glow, MD  Multiple Vitamins-Minerals (PRESERVISION AREDS 2 PO) Take 1 capsule by mouth daily.    [provider]  Omega-3 Fatty Acids (FISH OIL) 1000 MG CAPS Take 1,000 mg by mouth daily.     [provider]  omeprazole (PRILOSEC) 20 MG capsule Take 1 capsule (20 mg total) by mouth 2 (two) times daily before a meal. 12/28/18 04/27/19  Tyler Pita, MD  vitamin B-12 (CYANOCOBALAMIN) 1000 MCG tablet Take 2,000 mcg daily by mouth. 2 tabs    [provider]    Family History Family History  Problem Relation Age of Onset  . Hypertension Mother   . Stroke Mother   . Hyperlipidemia Mother   . Heart disease Father   . Diabetes Father   . Hypertension Father   . Hyperlipidemia Father   . Early death Father   . Diabetes Sister   . Heart disease Sister        heart murmur  . Arthritis Sister   . COPD Sister   . Hyperlipidemia Sister   . Hypertension Sister   . Pancreatic cancer Maternal Uncle   . Alzheimer's disease Paternal Aunt   . Colon cancer Maternal Grandfather   . Cancer Maternal Grandfather   . Early death Maternal Grandfather    . Alzheimer's disease Paternal Aunt   . Alzheimer's disease Paternal Aunt   . Colon polyps Sister        adenomous  . Arthritis Sister   . Heart disease Sister   . Hyperlipidemia Sister   . Hypertension Sister   . Cancer Sister   . Heart disease Daughter   . Hyperlipidemia Daughter   . Hypertension Daughter   . Heart disease Son   . Hyperlipidemia Son   . Alcohol abuse Son   . Depression Son   . Diabetes Son   . Stroke Maternal Grandmother   . Early death Paternal Grandmother   . Stroke Paternal Grandmother   . Cancer Other        2 paternal uncles and 1 maternal uncle colon cancer    Social History Social History   Tobacco Use  . Smoking status: Never Smoker  . Smokeless tobacco: Never Used  Substance Use Topics  . Alcohol use: Yes    Alcohol/week: 1.0 standard drinks    Types: 1 Cans of beer per week    Comment: once a week beer or wine  . Drug use: No     Allergies   Epinephrine, Penicillins, Lidocaine hcl, Other, and Valium [diazepam]   Review of Systems Review of Systems  Constitutional: Negative for chills and fever.  Respiratory: Negative for cough and shortness of breath.   Cardiovascular: Negative for chest pain and palpitations.  Gastrointestinal: Negative for abdominal  pain, nausea and vomiting.  Musculoskeletal: Positive for gait problem. Negative for arthralgias and back pain.  Skin: Positive for color change and wound.  Neurological: Negative for syncope.  All other systems reviewed and are negative.    Physical Exam Updated Vital Signs BP (!) 158/78   Pulse 62   Temp 98.6 F (37 C) (Oral)   Resp 13   Ht 5\' 6"  (1.676 m)   Wt 81.6 kg   SpO2 100%   BMI 29.05 kg/m   Physical Exam Vitals signs and nursing note reviewed.  Constitutional:      Appearance: She is well-developed. She is not ill-appearing.  HENT:     Head: Normocephalic and atraumatic.  Eyes:     Extraocular Movements: Extraocular movements intact.      Conjunctiva/sclera: Conjunctivae normal.  Neck:     Musculoskeletal: Neck supple.  Cardiovascular:     Rate and Rhythm: Normal rate and regular rhythm.     Heart sounds: No murmur.  Pulmonary:     Effort: Pulmonary effort is normal. No respiratory distress.     Breath sounds: Normal breath sounds.  Abdominal:     General: There is no distension.     Palpations: Abdomen is soft.     Tenderness: There is no abdominal tenderness. There is no guarding or rebound.     Comments: No abdominal tenderness to palpation  Musculoskeletal:     Comments: Symmetric edema 1+ in bilateral lower extremities  Skin:    General: Skin is warm and dry.     Comments: Very mild pinkness to the left leg compared to right leg  Neurological:     Mental Status: She is alert.     Comments: Able to tolerate ambulating well although subjectively reporting pain 5 out of 10 while ambulating      ED Treatments / Results  Labs (all labs ordered are listed, but only abnormal results are displayed) Labs Reviewed  COMPREHENSIVE METABOLIC PANEL - Abnormal; Notable for the following components:      Result Value   CO2 21 (*)    Glucose, Bld 125 (*)    Total Bilirubin 1.6 (*)    GFR calc non Af Amer 56 (*)    All other components within normal limits  CULTURE, BLOOD (ROUTINE X 2)  CULTURE, BLOOD (ROUTINE X 2)  LACTIC ACID, PLASMA  CBC WITH DIFFERENTIAL/PLATELET  PROTIME-INR    EKG None  Radiology Dg Chest 2 View  Result Date: 05/31/2019 CLINICAL DATA:  Cellulitis. Possible sepsis. Chronic shortness of breath. EXAM: CHEST - 2 VIEW COMPARISON:  CT chest 01/21/2019.  Chest x-ray 10/02/2018. FINDINGS: Mediastinum and hilar structures normal. Lungs are clear. No pleural effusion or pneumothorax. Surgical clips right upper quadrant. Degenerative change thoracic spine. Surgical clips right shoulder. IMPRESSION: No acute cardiopulmonary disease. Electronically Signed   By: Marcello Moores  Register   On: 05/31/2019 13:31    Dg Tibia/fibula Left  Result Date: 05/31/2019 CLINICAL DATA:  Concern for osteomyelitis. History of cellulitis. EXAM: LEFT TIBIA AND FIBULA - 2 VIEW COMPARISON:  None. FINDINGS: No osseous abnormality in the tibia fibula suggest acute infection. Total knee arthroplasty noted. Vascular calcifications in the soft tissues. IMPRESSION: 1. No radiographic evidence of osteomyelitis in the tibia or fibula. 2. Vascular calcifications in the soft tissues of the lower leg. Electronically Signed   By: Suzy Bouchard M.D.   On: 05/31/2019 13:26    Procedures Procedures (including critical care time)  Medications Ordered in ED Medications  clindamycin (CLEOCIN) IVPB 600 mg (0 mg Intravenous Stopped 05/31/19 1940)     Initial Impression / Assessment and Plan / ED Course  I have reviewed the triage vital signs and the nursing notes.  Pertinent labs & imaging results that were available during my care of the patient were reviewed by me and considered in my medical decision making (see chart for details).        [name] is a 78 y.o. female with a complex past medical history above presenting today for concern for cellulitis.  Her PCP was concerned that she might need IV antibiotics and sent her to be evaluated in the emergency department.  Labs and imaging sent for differential diagnosis of osteomyelitis, sepsis, electrolyte abnormality, anemia.  Labs do not show any significant leukocytosis, anemia, electrolyte abnormality.  Plastic surgery consulted for wound check and possible cellulitis from the wound.  They feel that the wound is unrelated to the cellulitis.  At this time, considering well appearance of patient unable to ambulate, afebrile, normal vital signs, will trial third antibiotic outpatient with clindamycin.  Unclear if this is actually cellulitis or left leg pain from unknown etiology.  Patient reports that she has known neuropathy, but this feels different.  She does admit that  the redness of her left lower leg is intermittent, and comes and goes throughout the day.  This is not consistent with cellulitis.  We will continue with the antibiotic outpatient to see if this helps her symptoms.  Recommend outpatient follow-up with her PCP.  Care of patient discussed with the supervising attending.  Final Clinical Impressions(s) / ED Diagnoses   Final diagnoses:  Left leg pain    ED Discharge Orders         Ordered    clindamycin (CLEOCIN) 150 MG capsule  3 times daily     05/31/19 1937           Julianne Rice, MD 06/01/19 [id number]    Margette Fast, MD 06/01/19 1209

## 2019-06-01 ENCOUNTER — Other Ambulatory Visit (HOSPITAL_COMMUNITY): Payer: PPO

## 2019-06-03 ENCOUNTER — Other Ambulatory Visit: Payer: Self-pay | Admitting: *Deleted

## 2019-06-03 ENCOUNTER — Ambulatory Visit: Payer: PPO | Admitting: Family Medicine

## 2019-06-04 ENCOUNTER — Ambulatory Visit: Payer: PPO | Admitting: Surgical

## 2019-06-05 ENCOUNTER — Encounter (HOSPITAL_BASED_OUTPATIENT_CLINIC_OR_DEPARTMENT_OTHER): Payer: Self-pay

## 2019-06-05 ENCOUNTER — Ambulatory Visit (HOSPITAL_BASED_OUTPATIENT_CLINIC_OR_DEPARTMENT_OTHER): Admit: 2019-06-05 | Payer: PPO | Admitting: Plastic Surgery

## 2019-06-05 LAB — CULTURE, BLOOD (ROUTINE X 2): Culture: NO GROWTH

## 2019-06-05 SURGERY — IRRIGATION AND DEBRIDEMENT WOUND
Anesthesia: General | Site: Leg Lower | Laterality: Left

## 2019-06-06 ENCOUNTER — Other Ambulatory Visit: Payer: Self-pay

## 2019-06-06 ENCOUNTER — Ambulatory Visit: Payer: PPO | Admitting: Surgical

## 2019-06-06 DIAGNOSIS — Z20822 Contact with and (suspected) exposure to covid-19: Secondary | ICD-10-CM

## 2019-06-09 LAB — NOVEL CORONAVIRUS, NAA: SARS-CoV-2, NAA: NOT DETECTED

## 2019-06-11 ENCOUNTER — Encounter: Payer: PPO | Admitting: Plastic Surgery

## 2019-06-11 ENCOUNTER — Encounter: Payer: PPO | Admitting: Surgical

## 2019-06-19 DIAGNOSIS — Z23 Encounter for immunization: Secondary | ICD-10-CM | POA: Diagnosis not present

## 2019-06-19 DIAGNOSIS — L821 Other seborrheic keratosis: Secondary | ICD-10-CM | POA: Diagnosis not present

## 2019-06-19 DIAGNOSIS — C44729 Squamous cell carcinoma of skin of left lower limb, including hip: Secondary | ICD-10-CM | POA: Diagnosis not present

## 2019-06-19 DIAGNOSIS — L570 Actinic keratosis: Secondary | ICD-10-CM | POA: Diagnosis not present

## 2019-06-25 ENCOUNTER — Encounter: Payer: PPO | Admitting: Surgical

## 2019-07-02 ENCOUNTER — Telehealth: Payer: Self-pay | Admitting: Internal Medicine

## 2019-07-02 NOTE — Telephone Encounter (Signed)
The patient wants to be seen in the office . Left leg pink and painful to touch. See ED note from 11.13.20. Patient has an office appointment scheduled for 12.17.20 @ 2:00.

## 2019-07-02 NOTE — Telephone Encounter (Signed)
She needs to call Dr. Theodoro Kos Dillingham's office I am no longer managing her leg wound  This is a plastic surgeon wound specialist and she is established   [place]

## 2019-07-03 NOTE — Telephone Encounter (Signed)
Pt went on to mention about if you need to see her for cellulitis? Dr Alessandra Grout surgeon released pt. Please advise and Thank you!   Call pt @ [phone]

## 2019-07-03 NOTE — Telephone Encounter (Signed)
Patient stated she doesn't need an appointment right now. She will let sx get better on their own. Her leg is getting better. Her surgeon says she doesn't need to be seen anymore by her. The pig bladder procedure has helped tremendously per patient. She canceled her surgery.

## 2019-07-03 NOTE — Telephone Encounter (Signed)
Call pt I am not managing her cellulitis  When she has an issue she needs to call Dr. Theodoro Kos dillinghams office who manages her wound she is plastic surgeon who manages and tries to heal wounds  I cant do anything for her for wound but above can   [place]

## 2019-07-03 NOTE — Telephone Encounter (Signed)
Pt has scheduled the appt with Dr Migdalia Dk ofc.

## 2019-07-03 NOTE — Telephone Encounter (Signed)
Noted  

## 2019-07-04 ENCOUNTER — Ambulatory Visit: Payer: PPO | Admitting: Internal Medicine

## 2019-07-17 ENCOUNTER — Encounter: Payer: Self-pay | Admitting: Internal Medicine

## 2019-07-17 DIAGNOSIS — C4490 Unspecified malignant neoplasm of skin, unspecified: Secondary | ICD-10-CM

## 2019-07-17 HISTORY — DX: Unspecified malignant neoplasm of skin, unspecified: C44.90

## 2019-08-02 DIAGNOSIS — L570 Actinic keratosis: Secondary | ICD-10-CM | POA: Diagnosis not present

## 2019-08-02 DIAGNOSIS — L821 Other seborrheic keratosis: Secondary | ICD-10-CM | POA: Diagnosis not present

## 2019-08-08 ENCOUNTER — Encounter (INDEPENDENT_AMBULATORY_CARE_PROVIDER_SITE_OTHER): Payer: Self-pay

## 2019-08-08 ENCOUNTER — Other Ambulatory Visit: Payer: Self-pay

## 2019-08-08 ENCOUNTER — Ambulatory Visit (INDEPENDENT_AMBULATORY_CARE_PROVIDER_SITE_OTHER): Payer: PPO

## 2019-08-08 VITALS — Ht 66.0 in | Wt 180.0 lb

## 2019-08-08 DIAGNOSIS — Z0000 Encounter for general adult medical examination without abnormal findings: Secondary | ICD-10-CM | POA: Diagnosis not present

## 2019-08-08 NOTE — Progress Notes (Signed)
Subjective:   [name] is a 79 y.o. female who presents for an Initial Medicare Annual Wellness Visit.  Review of Systems    No ROS.  Medicare Wellness Virtual Visit.  Visual/audio telehealth visit, UTA vital signs.   Ht/Wt provided.  See social history for additional risk factors.  Cardiac Risk Factors include: advanced age (>18men, >45 women)     Objective:    Today's Vitals   08/08/19 1032  Weight: 180 lb (81.6 kg)  Height: 5\' 6"  (1.676 m)   Body mass index is 29.05 kg/m.  Advanced Directives 08/08/2019 05/31/2019 10/02/2018 05/24/2017 05/16/2017 05/10/2017 09/28/2016  Does Patient Have a Medical Advance Directive? Yes Yes Yes Yes Yes Yes Yes  Type of Paramedic of [place];Living will [place];Living will [place];Living will;Out of facility DNR (pink MOST or yellow form) - [place];Living will [place];Living will -  Does patient want to make changes to medical advance directive? - - - No - Patient declined No - Patient declined - -  Copy of [place] in Chart? No - copy requested No - copy requested - - No - copy requested No - copy requested -  Would patient like information on creating a medical advance directive? - - - - - - -  Pre-existing out of facility DNR order (yellow form or pink MOST form) - - - - - - -    Current Medications (verified) Outpatient Encounter Medications as of 08/08/2019  Medication Sig  . albuterol (PROAIR HFA) 108 (90 Base) MCG/ACT inhaler ProAir HFA 90 mcg/actuation aerosol inhaler  INHALE 1 2 PUFFS INTO THE LUNGS EVERY 6 (SIX) HOURS AS NEEDED FOR WHEEZING OR SHORTNESS OF BREATH.  Marland Kitchen Ascorbic Acid (VITAMIN C) 1000 MG tablet Take 1,000 mg by mouth daily.  Marland Kitchen aspirin 81 MG tablet TAKE 1 TABLET BY MOUTH EVERY DAY  . atorvastatin (LIPITOR) 10 MG tablet Take 10 mg daily by mouth.   . Calcium 600-200 MG-UNIT tablet Take 1  tablet daily by mouth.  . Cholecalciferol (VITAMIN D3) 2000 UNITS capsule Take 2,000 Units by mouth daily.  . Coenzyme Q10 (COQ10) 30 MG CAPS Take by mouth.  . levothyroxine (SYNTHROID) 50 MCG tablet Take 1 tablet (50 mcg total) by mouth daily before breakfast.  . mirabegron ER (MYRBETRIQ) 25 MG TB24 tablet Take 1 tablet (25 mg total) by mouth daily.  . Multiple Vitamins-Minerals (PRESERVISION AREDS 2 PO) Take 1 capsule by mouth daily.  . Omega-3 Fatty Acids (FISH OIL) 1000 MG CAPS Take 1,000 mg by mouth daily.   . sertraline (ZOLOFT) 25 MG tablet Take 25 mg by mouth daily.  . vitamin B-12 (CYANOCOBALAMIN) 1000 MCG tablet Take 2,000 mcg daily by mouth. 2 tabs  . omeprazole (PRILOSEC) 20 MG capsule Take 1 capsule (20 mg total) by mouth 2 (two) times daily before a meal.   No facility-administered encounter medications on file as of 08/08/2019.    Allergies (verified) Epinephrine, Penicillins, Lidocaine hcl, Other, and Valium [diazepam]   History: Past Medical History:  Diagnosis Date  . Acid reflux   . Allergy   . Anemia   . Anxiety    in past  . Arthritis    all over  . Atypical moles   . Bronchitis   . Bronchospasm    reactive with perfume/cologne and smoke  . CAD (coronary artery disease)    patient denies on preop of 10/24   .  Candida esophagitis ([place])   . Carotid artery calcification, bilateral 04/2016  . Cataract   . Cervical stenosis of spine   . Chronic insomnia   . Complication of anesthesia    shoulder surgery- not all the way asleep  . COPD (chronic obstructive pulmonary disease) (HCC)    chronic bronchitis   . Depression    in past  . Diverticulitis   . Diverticulosis   . Dizziness   . Elevated liver enzymes   . Family history of adverse reaction to anesthesia    sister- has problems with nausea and vomiting   . Fatigue   . Fatty liver   . Generalized headache    migraines  . GERD (gastroesophageal reflux disease)   . History of chemotherapy     topical cream for h/o skin CA  . History of kidney stones   . History of neuropathy    FH Dr. Posey Pronto neurology  . History of vertigo   . Hx of migraines   . Hyperlipidemia   . Hypothyroidism   . Insomnia   . Irritable bowel syndrome   . LLQ abdominal pain   . Melanoma ([place]) 2013  . Melanoma ([place])    x2 stg II melanoma  . Neuropathy, peripheral   . Peripheral neuropathy   . Pneumonia    hx of x 2  . PONV (postoperative nausea and vomiting)   . Pre-diabetes   . Retina disorder    left  . SCC (squamous cell carcinoma)    skin   . Stroke [facility])    ? TIA per Dr V per patient   . Thyroid disease   . Urine incontinence   . Vitamin D deficiency   . Weight gain    Past Surgical History:  Procedure Laterality Date  . BILATERAL SALPINGOOPHORECTOMY     ovarian cyst Dr. Ouida Sills and Dr. Alisia Ferrari 2008  . BREAST SURGERY     in 2014 bx   . BREAST SURGERY     augmentation with saline Dr. Hubbard Hartshorn 1995   . CHOLECYSTECTOMY N/A 01/28/2015   Procedure: LAPAROSCOPIC CHOLECYSTECTOMY WITH INTRAOPERATIVE CHOLANGIOGRAM;  Surgeon: Autumn Messing III, MD;  Location: [facility];  Service: General;  Laterality: N/A;  . CLOSED MANIPULATION SHOULDER     rt  . COLONOSCOPY WITH PROPOFOL N/A 11/20/2012   Procedure: COLONOSCOPY WITH PROPOFOL;  Surgeon: Garlan Fair, MD;  Location: [facility];  Service: Endoscopy;  Laterality: N/A;  . colonscopy     May 2014  . JOINT REPLACEMENT     RTK  . KNEE ARTHROSCOPY    . left knee miniscus tear repair    . MELANOMA EXCISION WITH SENTINEL LYMPH NODE BIOPSY  03/18/11   Back, nodes neg  . OOPHORECTOMY    . ROTATOR CUFF REPAIR     left  . SHOULDER SURGERY     right and left   . TOTAL HIP ARTHROPLASTY    . TOTAL KNEE ARTHROPLASTY     right 2010 and left in 2018   . TOTAL KNEE ARTHROPLASTY Left 05/16/2017   Procedure: LEFT TOTAL KNEE ARTHROPLASTY;  Surgeon: Paralee Cancel, MD;  Location: [facility];  Service: Orthopedics;  Laterality: Left;  70 mins  . TUBAL LIGATION     . TUMOR REMOVAL     ovary   Family History  Problem Relation Age of Onset  . Hypertension Mother   . Stroke Mother   . Hyperlipidemia Mother   . Heart disease Father   .  Diabetes Father   . Hypertension Father   . Hyperlipidemia Father   . Early death Father   . Diabetes Sister   . Heart disease Sister        heart murmur  . Arthritis Sister   . COPD Sister   . Hyperlipidemia Sister   . Hypertension Sister   . Pancreatic cancer Maternal Uncle   . Alzheimer's disease Paternal Aunt   . Colon cancer Maternal Grandfather   . Cancer Maternal Grandfather   . Early death Maternal Grandfather   . Alzheimer's disease Paternal Aunt   . Alzheimer's disease Paternal Aunt   . Colon polyps Sister        adenomous  . Arthritis Sister   . Heart disease Sister   . Hyperlipidemia Sister   . Hypertension Sister   . Cancer Sister   . Heart disease Daughter   . Hyperlipidemia Daughter   . Hypertension Daughter   . Heart disease Son   . Hyperlipidemia Son   . Alcohol abuse Son   . Depression Son   . Diabetes Son   . Stroke Maternal Grandmother   . Early death Paternal Grandmother   . Stroke Paternal Grandmother   . Cancer Other        2 paternal uncles and 1 maternal uncle colon cancer  . Alzheimer's disease Paternal Aunt    Social History   Socioeconomic History  . Marital status: Divorced    Spouse name: Not on file  . Number of children: 3  . Years of education: College  . Highest education level: Some college, no degree  Occupational History  . Occupation: Retired    Fish farm manager: RETIRED  Tobacco Use  . Smoking status: Never Smoker  . Smokeless tobacco: Never Used  Substance and Sexual Activity  . Alcohol use: Yes    Alcohol/week: 1.0 standard drinks    Types: 1 Cans of beer per week    Comment: once a week beer or wine  . Drug use: No  . Sexual activity: Not Currently  Other Topics Concern  . Not on file  Social History Narrative   Patient lives at home alone  in a one story home.   Has 3 children (2 daughters and 1 son)    Retired from [employer]   Divorced since 1992    Caffeine Use: 1 cup daily   No guns    Wears seat belt    Lives with cat   Social Determinants of Health   Financial Resource Strain: Low Risk   . Difficulty of Paying Living Expenses: Not hard at all  Food Insecurity: No Food Insecurity  . Worried About Charity fundraiser in the Last Year: Never true  . Ran Out of Food in the Last Year: Never true  Transportation Needs: No Transportation Needs  . Lack of Transportation (Medical): No  . Lack of Transportation (Non-Medical): No  Physical Activity:   . Days of Exercise per Week: Not on file  . Minutes of Exercise per Session: Not on file  Stress: No Stress Concern Present  . Feeling of Stress : Only a little  Social Connections:   . Frequency of Communication with Friends and Family: Not on file  . Frequency of Social Gatherings with Friends and Family: Not on file  . Attends Religious Services: Not on file  . Active Member of Clubs or Organizations: Not on file  . Attends Archivist Meetings: Not on file  .  Marital Status: Not on file    Tobacco Counseling Counseling given: Not Answered   Clinical Intake:                        Activities of Daily Living In your present state of health, do you have any difficulty performing the following activities: 08/08/2019  Hearing? N  Vision? N  Difficulty concentrating or making decisions? N  Walking or climbing stairs? Y  Comment Unsteady gait; cane in use as needed  Dressing or bathing? N  Doing errands, shopping? N  Preparing Food and eating ? N  Using the Toilet? N  In the past six months, have you accidently leaked urine? Y  Comment Managed with daily pad  Do you have problems with loss of bowel control? N  Managing your Medications? N  Managing your Finances? N  Housekeeping or managing your Housekeeping? N  Some  recent data might be hidden     Immunizations and Health Maintenance Immunization History  Administered Date(s) Administered  . Fluad Quad(high Dose 65+) 04/25/2019  . Influenza Split 04/18/2017  . Influenza, High Dose Seasonal PF 04/18/2017, 04/12/2018  . Influenza-Unspecified 05/03/2018  . Pneumococcal Conjugate-13 05/15/2014  . Pneumococcal Polysaccharide-23 12/16/2005, 05/18/2016  . Td 10/18/2012  . Zoster Recombinat (Shingrix) 06/16/2011   Health Maintenance Due  Topic Date Due  . URINE MICROALBUMIN  12/13/1950    Patient Care Team: McLean-Scocuzza, Nino Glow, MD as PCP - General (Internal Medicine) Patient, No Pcp Per (General Practice)  Indicate any recent Medical Services you may have received from other than [facility] providers in the past year (date may be approximate).     Assessment:   This is a routine wellness examination for [place].  Nurse connected with patient 08/08/19 at 10:30 AM EST by a telephone enabled telemedicine application and verified that I am speaking with the correct person using two identifiers. Patient stated full name and DOB. Patient gave permission to continue with virtual visit. Patient's location was at home and Nurse's location was at [place] office.   Patient is alert and oriented x3. Patient denies difficulty focusing or concentrating. Patient likes to read mystery books and sew for brain stimulation.   Health Maintenance Due: -Urine microalbumin- followed by pcp. See completed HM at the end of note.   Eye: Visual acuity not assessed. Virtual visit. Followed by their ophthalmologist.  Dental: Visits every 6 months.    Hearing: Demonstrates normal hearing during visit.  Safety:  Patient feels safe at home- yes Patient does have smoke detectors at home- yes Patient does wear sunscreen or protective clothing when in direct sunlight - yes Patient does wear seat belt when in a moving vehicle - yes Patient drives- yes Adequate lighting  in walkways free from debris- yes Grab bars and handrails used as appropriate- yes Ambulates with an assistive device- yes; cane as needed Cell phone on person when ambulating outside of the home- yes  Social: Alcohol intake - yes      Smoking history- never   Smokers in home? none Illicit drug use? none  Medication: Taking as directed and without issues.  Pill box in use -yes  Self managed - yes   Covid-19: Precautions and sickness symptoms discussed. Wears mask, social distancing, hand hygiene as appropriate.   Activities of Daily Living Patient denies needing assistance with: household chores, feeding themselves, getting from bed to chair, getting to the toilet, bathing/showering, dressing, managing money, or preparing meals.  Discussed the importance of a healthy diet, water intake and the benefits of aerobic exercise.    Physical activity- no routine. Encouraged to stay active.   Diet:  Regular Water: poor intake Caffeine: 1 cup of coffee  Other Providers Patient Care Team: McLean-Scocuzza, Nino Glow, MD as PCP - General (Internal Medicine) Patient, No Pcp Per (General Practice)  Hearing/Vision screen  Hearing Screening   125Hz  250Hz  500Hz  1000Hz  2000Hz  3000Hz  4000Hz  6000Hz  8000Hz   Right ear:           Left ear:           Comments: Patient is able to hear conversational tones without difficulty.  No issues reported.    Vision Screening Comments: Visual acuity not assessed, virtual visit.  They have seen their ophthalmologist in the last 12 months.     Dietary issues and exercise activities discussed: Current Exercise Habits: The patient does not participate in regular exercise at present  Goals      Patient Stated   . I want to walk more for exercise (pt-stated)      Depression Screen PHQ 2/9 Scores 08/08/2019 02/19/2019 09/07/2015  PHQ - 2 Score 1 4 -  PHQ- 9 Score - 8 -  Exception Documentation - - Patient refusal    Fall Risk Fall Risk  08/08/2019  02/19/2019 09/22/2017 10/31/2016 04/27/2016  Falls in the past year? 0 0 No No No  Number falls in past yr: - 0 - - -  Follow up Falls evaluation completed - - - -   Timed Get Up and Go Performed no, virtual visit  Cognitive Function: MMSE - Mini Mental State Exam 10/31/2016 04/27/2016  Not completed: Refused Unable to complete   [facility] Cognitive Assessment  09/22/2017 12/17/2015 12/17/2015  Visuospatial/ Executive (0/5) 4 4 4   Naming (0/3) 3 3 3   Attention: Read list of digits (0/2) 2 2 2   Attention: Read list of letters (0/1) 1 1 1   Attention: Serial 7 subtraction starting at 100 (0/3) 2 3 3   Language: Repeat phrase (0/2) 2 2 2   Language : Fluency (0/1) 0 1 1  Abstraction (0/2) 2 2 2   Delayed Recall (0/5) 4 3 3   Orientation (0/6) 6 6 6   Total 26 27 27   Adjusted Score (based on education) 26 - 27   6CIT Screen 08/08/2019  What Year? 0 points  What month? 0 points  What time? 0 points  Count back from 20 0 points  Months in reverse 0 points  Repeat phrase 0 points  Total Score 0    Screening Tests Health Maintenance  Topic Date Due  . URINE MICROALBUMIN  12/13/1950  . TETANUS/TDAP  10/19/2022  . INFLUENZA VACCINE  Completed  . DEXA SCAN  Completed  . PNA vac Low Risk Adult  Completed     Plan:   Keep all routine maintenance appointments.   Follow up 08/13/19 @ 3:00  Medicare Attestation I have personally reviewed: The patient's medical and social history Their use of alcohol, tobacco or illicit drugs Their current medications and supplements The patient's functional ability including ADLs,fall risks, home safety risks, cognitive, and hearing and visual impairment Diet and physical activities Evidence for depression   I have reviewed and discussed with patient certain preventive protocols, quality metrics, and best practice recommendations.     Varney Biles, LPN   [phone]

## 2019-08-08 NOTE — Patient Instructions (Addendum)
  [name] , Thank you for taking time to come for your Medicare Wellness Visit. I appreciate your ongoing commitment to your health goals. Please review the following plan we discussed and let me know if I can assist you in the future.   These are the goals we discussed: Goals      Patient Stated   . I want to walk more for exercise (pt-stated)       This is a list of the screening recommended for you and due dates:  Health Maintenance  Topic Date Due  . Urine Protein Check  12/13/1950  . Tetanus Vaccine  10/19/2022  . Flu Shot  Completed  . DEXA scan (bone density measurement)  Completed  . Pneumonia vaccines  Completed

## 2019-08-13 ENCOUNTER — Ambulatory Visit (INDEPENDENT_AMBULATORY_CARE_PROVIDER_SITE_OTHER): Payer: PPO | Admitting: Internal Medicine

## 2019-08-13 ENCOUNTER — Other Ambulatory Visit: Payer: Self-pay

## 2019-08-13 VITALS — Ht 66.0 in | Wt 180.0 lb

## 2019-08-13 DIAGNOSIS — R42 Dizziness and giddiness: Secondary | ICD-10-CM

## 2019-08-13 DIAGNOSIS — R030 Elevated blood-pressure reading, without diagnosis of hypertension: Secondary | ICD-10-CM

## 2019-08-13 DIAGNOSIS — I6523 Occlusion and stenosis of bilateral carotid arteries: Secondary | ICD-10-CM

## 2019-08-13 DIAGNOSIS — C4490 Unspecified malignant neoplasm of skin, unspecified: Secondary | ICD-10-CM | POA: Diagnosis not present

## 2019-08-13 DIAGNOSIS — E038 Other specified hypothyroidism: Secondary | ICD-10-CM

## 2019-08-13 DIAGNOSIS — M4802 Spinal stenosis, cervical region: Secondary | ICD-10-CM | POA: Insufficient documentation

## 2019-08-13 DIAGNOSIS — R7303 Prediabetes: Secondary | ICD-10-CM | POA: Diagnosis not present

## 2019-08-13 DIAGNOSIS — F4321 Adjustment disorder with depressed mood: Secondary | ICD-10-CM

## 2019-08-13 DIAGNOSIS — Z1389 Encounter for screening for other disorder: Secondary | ICD-10-CM | POA: Diagnosis not present

## 2019-08-13 DIAGNOSIS — I6529 Occlusion and stenosis of unspecified carotid artery: Secondary | ICD-10-CM | POA: Insufficient documentation

## 2019-08-13 DIAGNOSIS — E785 Hyperlipidemia, unspecified: Secondary | ICD-10-CM

## 2019-08-13 NOTE — Progress Notes (Addendum)
Telephone Note  I connected with [name]  on 08/13/19 at  3:15 PM EST by telephone and verified that I am speaking with the correct person using two identifiers.  Location patient: home Location provider:work or home office Persons participating in the virtual visit: patient, provider  I discussed the limitations of evaluation and management by telemedicine and the availability of in person appointments. The patient expressed understanding and agreed to proceed.   HPI: 1. Pt has another SCC early left lower leg previously tx'ed site which was hard to heal is better after tx with pig bladder and mesh with plastics and cancelled surgery due to area is healed  2. Mood depressed but taking zoloft 25 mg qd had to give her dog up to the vet who placed dog before she was ready for dog to be placed and after finding out dog placed with older family she wanted dog to go with younger family and dog lost weight when she saw the dog and losing hair and she is sure if dog is still alive which worries her. She did get a cat which she has now but its not the same as a dog and several family members and friends have died  4. C/o dizziness but she has increased her water intake and using compression stockings during the day  4. C/o right neck throbbing pain intermittently 3-4/10 and worse days radiates to head and 8-9/10   MRI brain 10/19/15  IMPRESSION: Negative for metastatic disease to the brain.  No acute infarct. Moderate to advanced chronic microvascular ischemia.  Large joint effusion of the right TMJ. This is unchanged and may be related to degenerative change or rheumatoid arthritis  (of note RA 12/17/15 negative)  3 mm enhancing lesion the dens is stable and most likely related to arthritis. Question rheumatoid arthritis  Negative for metastatic disease to the brain.  MRI cervical 11/10/14  IMPRESSION: C3-4 mild spinal stenosis and moderate left foraminal encroachment  C4-5 mild  spinal stenosis and mild foraminal stenosis bilaterally  C5-6 moderate spinal stenosis and moderate foraminal stenosis bilaterally  C6-7 mild spinal stenosis and mild foraminal stenosis bilaterally.  5. Hypothyroidism on levo 50 mcg qd   ROS: See pertinent positives and negatives per HPI.  Past Medical History:  Diagnosis Date  . Acid reflux   . Allergy   . Anemia   . Anxiety    in past  . Arthritis    all over  . Atypical moles   . Bronchitis   . Bronchospasm    reactive with perfume/cologne and smoke  . CAD (coronary artery disease)    patient denies on preop of 10/24   . Candida esophagitis ([place])   . Carotid artery calcification, bilateral 04/2016  . Cataract   . Cervical stenosis of spine   . Chronic insomnia   . Complication of anesthesia    shoulder surgery- not all the way asleep  . COPD (chronic obstructive pulmonary disease) (HCC)    chronic bronchitis   . Depression    in past  . Diverticulitis   . Diverticulosis   . Dizziness   . Elevated liver enzymes   . Family history of adverse reaction to anesthesia    sister- has problems with nausea and vomiting   . Fatigue   . Fatty liver   . Generalized headache    migraines  . GERD (gastroesophageal reflux disease)   . History of chemotherapy    topical cream for h/o skin CA  .  History of kidney stones   . History of neuropathy    FH Dr. Posey Pronto neurology  . History of vertigo   . Hx of migraines   . Hyperlipidemia   . Hypothyroidism   . Insomnia   . Irritable bowel syndrome   . LLQ abdominal pain   . Melanoma ([place]) 2013  . Melanoma ([place])    x2 stg II melanoma  . Neuropathy, peripheral   . Peripheral neuropathy   . Pneumonia    hx of x 2  . PONV (postoperative nausea and vomiting)   . Pre-diabetes   . Retina disorder    left  . SCC (squamous cell carcinoma)    skin   . Stroke [facility])    ? TIA per Dr V per patient   . Thyroid disease   . Urine incontinence   . Vitamin D deficiency   .  Weight gain     Past Surgical History:  Procedure Laterality Date  . BILATERAL SALPINGOOPHORECTOMY     ovarian cyst Dr. Ouida Sills and Dr. Alisia Ferrari 2008  . BREAST SURGERY     in 2014 bx   . BREAST SURGERY     augmentation with saline Dr. Hubbard Hartshorn 1995   . CHOLECYSTECTOMY N/A 01/28/2015   Procedure: LAPAROSCOPIC CHOLECYSTECTOMY WITH INTRAOPERATIVE CHOLANGIOGRAM;  Surgeon: Autumn Messing III, MD;  Location: [facility];  Service: General;  Laterality: N/A;  . CLOSED MANIPULATION SHOULDER     rt  . COLONOSCOPY WITH PROPOFOL N/A 11/20/2012   Procedure: COLONOSCOPY WITH PROPOFOL;  Surgeon: Garlan Fair, MD;  Location: [facility];  Service: Endoscopy;  Laterality: N/A;  . colonscopy     May 2014  . JOINT REPLACEMENT     RTK  . KNEE ARTHROSCOPY    . left knee miniscus tear repair    . MELANOMA EXCISION WITH SENTINEL LYMPH NODE BIOPSY  03/18/11   Back, nodes neg  . OOPHORECTOMY    . ROTATOR CUFF REPAIR     left  . SHOULDER SURGERY     right and left   . TOTAL HIP ARTHROPLASTY    . TOTAL KNEE ARTHROPLASTY     right 2010 and left in 2018   . TOTAL KNEE ARTHROPLASTY Left 05/16/2017   Procedure: LEFT TOTAL KNEE ARTHROPLASTY;  Surgeon: Paralee Cancel, MD;  Location: [facility];  Service: Orthopedics;  Laterality: Left;  70 mins  . TUBAL LIGATION    . TUMOR REMOVAL     ovary    Family History  Problem Relation Age of Onset  . Hypertension Mother   . Stroke Mother   . Hyperlipidemia Mother   . Heart disease Father   . Diabetes Father   . Hypertension Father   . Hyperlipidemia Father   . Early death Father   . Diabetes Sister   . Heart disease Sister        heart murmur  . Arthritis Sister   . COPD Sister   . Hyperlipidemia Sister   . Hypertension Sister   . Pancreatic cancer Maternal Uncle   . Alzheimer's disease Paternal Aunt   . Colon cancer Maternal Grandfather   . Cancer Maternal Grandfather   . Early death Maternal Grandfather   . Alzheimer's disease Paternal Aunt   .  Alzheimer's disease Paternal Aunt   . Colon polyps Sister        adenomous  . Arthritis Sister   . Heart disease Sister   . Hyperlipidemia Sister   . Hypertension Sister   .  Cancer Sister   . Heart disease Daughter   . Hyperlipidemia Daughter   . Hypertension Daughter   . Heart disease Son   . Hyperlipidemia Son   . Alcohol abuse Son   . Depression Son   . Diabetes Son   . Stroke Maternal Grandmother   . Early death Paternal Grandmother   . Stroke Paternal Grandmother   . Cancer Other        2 paternal uncles and 1 maternal uncle colon cancer  . Alzheimer's disease Paternal Aunt     SOCIAL HX:  Patient lives at home alone in a one story home. Has 3 children (2 daughters and 1 son)  Retired from [employer] Divorced since 1992  Caffeine Use: 1 cup daily No guns  Wears seat belt  Lives with cat   Current Outpatient Medications:  .  albuterol (PROAIR HFA) 108 (90 Base) MCG/ACT inhaler, ProAir HFA 90 mcg/actuation aerosol inhaler  INHALE 1 2 PUFFS INTO THE LUNGS EVERY 6 (SIX) HOURS AS NEEDED FOR WHEEZING OR SHORTNESS OF BREATH., Disp: , Rfl:  .  Ascorbic Acid (VITAMIN C) 1000 MG tablet, Take 1,000 mg by mouth daily., Disp: , Rfl:  .  aspirin 81 MG tablet, TAKE 1 TABLET BY MOUTH EVERY DAY, Disp: , Rfl:  .  atorvastatin (LIPITOR) 10 MG tablet, Take 10 mg daily by mouth. , Disp: , Rfl:  .  Calcium 600-200 MG-UNIT tablet, Take 1 tablet daily by mouth., Disp: , Rfl:  .  Cholecalciferol (VITAMIN D3) 2000 UNITS capsule, Take 2,000 Units by mouth daily., Disp: , Rfl:  .  Coenzyme Q10 (COQ10) 30 MG CAPS, Take by mouth., Disp: , Rfl:  .  levothyroxine (SYNTHROID) 50 MCG tablet, Take 1 tablet (50 mcg total) by mouth daily before breakfast., Disp: 30 tablet, Rfl: 1 .  mirabegron ER (MYRBETRIQ) 25 MG TB24 tablet, Take 1 tablet (25 mg total) by mouth daily., Disp: 30 tablet, Rfl:  .  Multiple Vitamins-Minerals (PRESERVISION AREDS 2 PO), Take 1 capsule by mouth daily., Disp: ,  Rfl:  .  Omega-3 Fatty Acids (FISH OIL) 1000 MG CAPS, Take 1,000 mg by mouth daily. , Disp: , Rfl:  .  sertraline (ZOLOFT) 25 MG tablet, Take 25 mg by mouth daily., Disp: , Rfl:  .  vitamin B-12 (CYANOCOBALAMIN) 1000 MCG tablet, Take 2,000 mcg daily by mouth. 2 tabs, Disp: , Rfl:  .  omeprazole (PRILOSEC) 20 MG capsule, Take 1 capsule (20 mg total) by mouth 2 (two) times daily before a meal., Disp: 60 capsule, Rfl: 3  EXAM:  VITALS per patient if applicable:  GENERAL: alert, oriented, appears well and in no acute distress  PSYCH/NEURO: pleasant and cooperative, no obvious depression or anxiety, speech and thought processing grossly intact  ASSESSMENT AND PLAN:  Discussed the following assessment and plan:  Cervical spinal stenosis mild back in 2016/2017  If neck pain continues consider repeat MRI cervical will hold for now    hypothyroidism - Plan: TSH cont levo 50 mcg qd   Skin cancer SCC left leg  -f/u dermatology 10/2019 she is currently treating another early SCC   Bilateral carotid artery stenosis - Plan: Lipid panel, Comprehensive metabolic panel Hyperlipidemia, unspecified hyperlipidemia type - Plan: Lipid panel, Comprehensive metabolic panel -on lipitor 10 mg qhs and aspirin 81 mg qd  -consider US carotid to f/u last was 3 years ago will hold for now   Prediabetes - Plan: Hemoglobin A1c  Elevated blood pressure reading - Plan:  Lipid panel, Comprehensive metabolic panel, CBC with Differential/Platelet Check BP with next lab visit   Adjustment disorder with depressed mood -on zoloft 25 mg qd   Dizziness -check vitals I.e BP with next set of labs   HM Flu shotgiven 04/2019 Tdap-had TD 10/18/12 not pertussis will need this in future shingrixwill need  zostervax had 06/16/11 prevnarhad 05/15/14 Pna 23had 12/16/05, 05/18/16 -[place] [facility] in my note covid shot 08/12/19 and 2nd dose sch 09/02/19   Pap out of age window LMP  [place] with implants6/30/2020 normal -10/18/16 screening with implants neg FH breast cancer p cousins dx'ed in 37s x 32, sister Had at age 41 y.o and colon cancer age 28, 2 paternal cousins ovarian cancer age 82 and 45   Colonoscopy 11/2012 tortuous colon no further rec. -Consider cologuard in future if no h/o polypsordered today 8/4/2020still not done as of 04/03/2019  DEXA -[facility] 06/28/16 normal reviewed report did not scan into chart -h/o vitamin D def on D3 2000 IU daily  Prediabetes A1C 08/15/18 6.110/2019 6.0 CBC normal 08/15/18, CMET Cr. 1.0 GFR 54 (slightly low), UA rare bacteria, TSH 1.524    [facility] Dr. Delman Cheadle mohs surgery  F/u from 04/10/19 left lower leg cellulitis for SCC changed to levaquin rec compression socks and acidic acid soaks qhs   05/15/19 f/u appt s/p SCC mohs removal f/u in 3 months Dr. Winifred Olive   [facility] saw 01/01/19 increased nocturia and urgency pending Botox to tx and UDS Dr. Junious Silk and try myrbetriq before UDS  Cards-Dr. Rockey Situ  [facility]  Eye MD Dr. Cordelia Pen [facility]-retinal specialist  Dentist Dr. Lynelle Smoke  Neurology-[facility]  ENT-Dr. Tami Ribas for vestibular balance issue s Podiatry Dr. Milinda Pointer  H[facility]o [facility]   Dermatology Dr. Delman Cheadle in [place] h/o SCC left leg and MM right leg,neck and backwith node dissection Dr. Marlou Starks 8/12 ortho Dr. Alvan Dame  Former PCP Dr. Jossie Ng requested labs, all vaccines, DEXA -pt brought in all records week of 09/21/2018 and reviewed  -h/o iron def anemia  -last seen 07/09/18 and 08/09/18 possible thrush and URI  US carotid 05/13/16 <50% stenosis in right and left ICA -she was at this time put on lipitor 10 mg qhs   CT ab/pelvis 07/26/17 left base scarring lung, well circumcised liver lesion likely cysts and stable hepatomegaly 19.4 cm cranlocaudal pancreas/spleen normal, extensive colonic diverticulosis, aortic and branch vessell atherosclerosis no  adenopathy, normal uterus and adnexa, advanced LS spondylosis grade 1 L4/5 and L5/S1 anterolisthesis are similar, pelvic floor laxity, hiatal hernia  CXR 02/26/18 thoracic aorta mildly atherosclerotic unchanged, Deg changes T spine, Deg changes b/l shoulders prior right shoulder surgery calcification distal left supraspinatus tendon at its insertion on the great tuberosity of the left humerus   UA normal 07/09/18  04/23/18 A1c 6.0  04/23/18 CBC 6.2/14.2/42.9/plts 199  04/23/18 CMET Cr 0.88 BUN 20 EGFR 62, total bili chronically elevated 1.1 (prior direct bili low 0.1 likely unconjugated high) AST15, ALT 14 normal  04/23/18 lipidTC 155, TG 143, HDL 68, LDL 58 10/17/17 vitamin D 41.1 04/18/16 33.7  TSH 06/29/17 2.06  FOBT neg x 3 05/19/16  -we discussed possible serious and likely etiologies, options for evaluation and workup, limitations of telemedicine visit vs in person visit, treatment, treatment risks and precautions. Pt prefers to treat via telemedicine empirically rather then risking or undertaking an in person visit at this moment. Patient agrees to seek prompt in  person care if worsening, new symptoms arise, or if is not improving with treatment.   I discussed the assessment and treatment plan with the patient. The patient was provided an opportunity to ask questions and all were answered. The patient agreed with the plan and demonstrated an understanding of the instructions.   The patient was advised to call back or seek an in-person evaluation if the symptoms worsen or if the condition fails to improve as anticipated.  Time spent 20 minutes  Delorise Jackson, MD

## 2019-08-19 ENCOUNTER — Ambulatory Visit: Payer: PPO | Attending: Internal Medicine

## 2019-08-19 DIAGNOSIS — Z20822 Contact with and (suspected) exposure to covid-19: Secondary | ICD-10-CM

## 2019-08-20 LAB — NOVEL CORONAVIRUS, NAA: SARS-CoV-2, NAA: NOT DETECTED

## 2019-08-26 ENCOUNTER — Telehealth: Payer: Self-pay | Admitting: Internal Medicine

## 2019-08-26 NOTE — Telephone Encounter (Signed)
Transfer pt to Access Nurse for High BP reading of 192/88 175/91 and 150/81 with on going dizzy and balance spells

## 2019-08-26 NOTE — Telephone Encounter (Signed)
Pt called back and said Access nurse told her she needed to schedule with Dr. Olivia Mackie in 1 to 2 days. She wanted an in office appt but b/c she had covid test last week I scheduled a phone visit @ 1pm 08/27/19.

## 2019-08-26 NOTE — Telephone Encounter (Signed)
Pt spoke with access nurse and the pt has been scheduled for an appt with Dr. Aundra Dubin tomorrow at [place].

## 2019-08-27 ENCOUNTER — Other Ambulatory Visit: Payer: Self-pay

## 2019-08-27 ENCOUNTER — Ambulatory Visit (INDEPENDENT_AMBULATORY_CARE_PROVIDER_SITE_OTHER): Payer: PPO | Admitting: Internal Medicine

## 2019-08-27 VITALS — BP 156/89 | HR 66 | Ht 66.0 in | Wt 180.0 lb

## 2019-08-27 DIAGNOSIS — I10 Essential (primary) hypertension: Secondary | ICD-10-CM

## 2019-08-27 MED ORDER — AMLODIPINE BESYLATE 2.5 MG PO TABS: 2.5000 mg | ORAL_TABLET | Freq: Every day | ORAL | 3 refills | Status: DC

## 2019-08-27 NOTE — Progress Notes (Signed)
telephone Note  I connected with [name]  on 08/27/19 at  1:15 PM EST by a telephone and verified that I am speaking with the correct person using two identifiers.  Location patient: home Location provider:work or home office Persons participating in the virtual visit: patient, provider  I discussed the limitations of evaluation and management by telemedicine and the availability of in person appointments. The patient expressed understanding and agreed to proceed.   HPI: 1. HTN BP elevating since buying BP cuff 134/86, 136/84, 122/76, 128.68 at times 170s-190s/80s-90s and 150s/80s and dizzy with HR 69 not had HTN prior per pt but review of charge BP elevated intermittently since 2016      ROS: See pertinent positives and negatives per HPI.  Past Medical History:  Diagnosis Date  . Acid reflux   . Allergy   . Anemia   . Anxiety    in past  . Arthritis    all over  . Atypical moles   . Bronchitis   . Bronchospasm    reactive with perfume/cologne and smoke  . CAD (coronary artery disease)    patient denies on preop of 10/24   . Candida esophagitis ([place])   . Carotid artery calcification, bilateral 04/2016  . Cataract   . Cervical stenosis of spine   . Chronic insomnia   . Complication of anesthesia    shoulder surgery- not all the way asleep  . COPD (chronic obstructive pulmonary disease) (HCC)    chronic bronchitis   . Depression    in past  . Diverticulitis   . Diverticulosis   . Dizziness   . Elevated liver enzymes   . Family history of adverse reaction to anesthesia    sister- has problems with nausea and vomiting   . Fatigue   . Fatty liver   . Generalized headache    migraines  . GERD (gastroesophageal reflux disease)   . History of chemotherapy    topical cream for h/o skin CA  . History of kidney stones   . History of neuropathy    FH Dr. Posey Pronto neurology  . History of vertigo   . Hx of migraines   . Hyperlipidemia   . Hypothyroidism   . Insomnia    . Irritable bowel syndrome   . LLQ abdominal pain   . Melanoma ([place]) 2013  . Melanoma ([place])    x2 stg II melanoma  . Neuropathy, peripheral   . Peripheral neuropathy   . Pneumonia    hx of x 2  . PONV (postoperative nausea and vomiting)   . Pre-diabetes   . Retina disorder    left  . SCC (squamous cell carcinoma)    skin   . Stroke [facility])    ? TIA per Dr V per patient   . Thyroid disease   . Urine incontinence   . Vitamin D deficiency   . Weight gain     Past Surgical History:  Procedure Laterality Date  . BILATERAL SALPINGOOPHORECTOMY     ovarian cyst Dr. Ouida Sills and Dr. Alisia Ferrari 2008  . BREAST SURGERY     in 2014 bx   . BREAST SURGERY     augmentation with saline Dr. Hubbard Hartshorn 1995   . CHOLECYSTECTOMY N/A 01/28/2015   Procedure: LAPAROSCOPIC CHOLECYSTECTOMY WITH INTRAOPERATIVE CHOLANGIOGRAM;  Surgeon: Autumn Messing III, MD;  Location: [facility];  Service: General;  Laterality: N/A;  . CLOSED MANIPULATION SHOULDER     rt  . COLONOSCOPY WITH PROPOFOL N/A  11/20/2012   Procedure: COLONOSCOPY WITH PROPOFOL;  Surgeon: Garlan Fair, MD;  Location: [facility];  Service: Endoscopy;  Laterality: N/A;  . colonscopy     May 2014  . JOINT REPLACEMENT     RTK  . KNEE ARTHROSCOPY    . left knee miniscus tear repair    . MELANOMA EXCISION WITH SENTINEL LYMPH NODE BIOPSY  03/18/11   Back, nodes neg  . OOPHORECTOMY    . ROTATOR CUFF REPAIR     left  . SHOULDER SURGERY     right and left   . TOTAL HIP ARTHROPLASTY    . TOTAL KNEE ARTHROPLASTY     right 2010 and left in 2018   . TOTAL KNEE ARTHROPLASTY Left 05/16/2017   Procedure: LEFT TOTAL KNEE ARTHROPLASTY;  Surgeon: Paralee Cancel, MD;  Location: [facility];  Service: Orthopedics;  Laterality: Left;  70 mins  . TUBAL LIGATION    . TUMOR REMOVAL     ovary    Family History  Problem Relation Age of Onset  . Hypertension Mother   . Stroke Mother   . Hyperlipidemia Mother   . Heart disease Father   . Diabetes Father   .  Hypertension Father   . Hyperlipidemia Father   . Early death Father   . Diabetes Sister   . Heart disease Sister        heart murmur  . Arthritis Sister   . COPD Sister   . Hyperlipidemia Sister   . Hypertension Sister   . Pancreatic cancer Maternal Uncle   . Alzheimer's disease Paternal Aunt   . Colon cancer Maternal Grandfather   . Cancer Maternal Grandfather   . Early death Maternal Grandfather   . Alzheimer's disease Paternal Aunt   . Alzheimer's disease Paternal Aunt   . Colon polyps Sister        adenomous  . Arthritis Sister   . Heart disease Sister   . Hyperlipidemia Sister   . Hypertension Sister   . Cancer Sister   . Heart disease Daughter   . Hyperlipidemia Daughter   . Hypertension Daughter   . Heart disease Son   . Hyperlipidemia Son   . Alcohol abuse Son   . Depression Son   . Diabetes Son   . Stroke Maternal Grandmother   . Early death Paternal Grandmother   . Stroke Paternal Grandmother   . Cancer Other        2 paternal uncles and 1 maternal uncle colon cancer  . Alzheimer's disease Paternal Aunt     SOCIAL HX:  Patient lives at home alone in a one story home. Has 3 children (2 daughters and 1 son)  Retired from [employer] Divorced since 1992  Caffeine Use: 1 cup daily No guns  Wears seat belt  Lives with cat    Current Outpatient Medications:  Marland Kitchen  Magnesium 250 MG TABS, Take 250 mg by mouth daily., Disp: , Rfl:  .  Zinc 50 MG CAPS, Take 50 mg by mouth daily., Disp: , Rfl:  .  albuterol (PROAIR HFA) 108 (90 Base) MCG/ACT inhaler, ProAir HFA 90 mcg/actuation aerosol inhaler  INHALE 1 2 PUFFS INTO THE LUNGS EVERY 6 (SIX) HOURS AS NEEDED FOR WHEEZING OR SHORTNESS OF BREATH., Disp: , Rfl:  .  amLODipine (NORVASC) 2.5 MG tablet, Take 1 tablet (2.5 mg total) by mouth daily. In am with food, Disp: 90 tablet, Rfl: 3 .  Ascorbic Acid (VITAMIN C) 1000 MG tablet,  Take 1,000 mg by mouth daily., Disp: , Rfl:  .  aspirin 81 MG tablet, TAKE 1  TABLET BY MOUTH EVERY DAY, Disp: , Rfl:  .  atorvastatin (LIPITOR) 10 MG tablet, Take 10 mg daily by mouth. , Disp: , Rfl:  .  Calcium 600-200 MG-UNIT tablet, Take 1 tablet daily by mouth., Disp: , Rfl:  .  Cholecalciferol (VITAMIN D3) 2000 UNITS capsule, Take 2,000 Units by mouth daily., Disp: , Rfl:  .  Coenzyme Q10 (COQ10) 30 MG CAPS, Take by mouth., Disp: , Rfl:  .  levothyroxine (SYNTHROID) 50 MCG tablet, Take 1 tablet (50 mcg total) by mouth daily before breakfast., Disp: 30 tablet, Rfl: 1 .  mirabegron ER (MYRBETRIQ) 25 MG TB24 tablet, Take 1 tablet (25 mg total) by mouth daily., Disp: 30 tablet, Rfl:  .  Multiple Vitamins-Minerals (PRESERVISION AREDS 2 PO), Take 1 capsule by mouth daily., Disp: , Rfl:  .  Omega-3 Fatty Acids (FISH OIL) 1000 MG CAPS, Take 1,000 mg by mouth daily. , Disp: , Rfl:  .  omeprazole (PRILOSEC) 20 MG capsule, Take 1 capsule (20 mg total) by mouth 2 (two) times daily before a meal., Disp: 60 capsule, Rfl: 3 .  sertraline (ZOLOFT) 25 MG tablet, Take 25 mg by mouth daily., Disp: , Rfl:  .  vitamin B-12 (CYANOCOBALAMIN) 1000 MCG tablet, Take 2,000 mcg daily by mouth. 2 tabs, Disp: , Rfl:   EXAM:  VITALS per patient if applicable:  GENERAL: alert, oriented, appears well and in no acute distress  PSYCH/NEURO: pleasant and cooperative, no obvious depression or anxiety, speech and thought processing grossly intact  ASSESSMENT AND PLAN:  Discussed the following assessment and plan:  Essential hypertension - Plan: amLODipine (NORVASC) 2.5 MG tablet  Monitor BP goal <130-140/<80 call back in 1 week after logging BP  Not adding salt to food and only drinking 1 cup of coffee   HM Flu shotgiven 04/2019 Tdap-had TD 10/18/12 not pertussis will need this in future shingrixwill need  zostervax had 06/16/11 prevnarhad 05/15/14 Pna 23had 12/16/05, 05/18/16 -[place] [facility] vaccines in my note covid shot 08/12/19 and 2nd dose sch 09/02/19    Pap out of age window LMP [place] with implants6/30/2020 normal -10/18/16 screening with implants neg FH breast cancer p cousins dx'ed in 33s x 49, sister Had at age 40 y.o and colon cancer age 45, 2 paternal cousins ovarian cancer age 58 and 35   Colonoscopy 11/2012 tortuous colon no further rec. -Consider cologuard in future if no h/o polypsordered today 8/4/2020still not done as of 04/03/2019  DEXA -[facility] 06/28/16 normal reviewed report did not scan into chart -h/o vitamin D def on D3 2000 IU daily  Prediabetes A1C 08/15/18 6.110/2019 6.0 CBC normal 08/15/18, CMET Cr. 1.0 GFR 54 (slightly low), UA rare bacteria, TSH 1.524    [facility] Dr. Delman Cheadle mohs surgery F/u from 04/10/19 left lower leg cellulitis for SCC changed to levaquin rec compression socks and acidic acid soaks qhs   05/15/19 f/u appt s/p SCC mohs removal f/u in 3 months Dr. Winifred Olive  [facility] saw 01/01/19 increased nocturia and urgency pending Botox to tx and UDS Dr. Junious Silk and try myrbetriq before UDS  Cards-Dr. Rockey Situ  [facility]  Eye MD Dr. Cordelia Pen [facility]  Dentist Dr. Lynelle Smoke  [facility]  ENT-Dr. Tami Ribas for vestibular balance issue s Podiatry Dr. Milinda Pointer  [facility]   Dermatology Dr.  Gould in [place] h/o SCC left leg and MM right leg,neck and backwith node dissection Dr. Marlou Starks 8/12 ortho Dr. Alvan Dame  Former PCP Dr. Jossie Ng requested labs, all vaccines, DEXA -pt brought in all records week of 09/21/2018 and reviewed  -h/o iron def anemia  -last seen 07/09/18 and 08/09/18 possible thrush and URI  US carotid 05/13/16 <50% stenosis in right and left ICA -she was at this time put on lipitor 10 mg qhs   CT ab/pelvis 07/26/17 left base scarring lung, well circumcised liver lesion likely cysts and stable hepatomegaly 19.4 cm cranlocaudal pancreas/spleen normal, extensive colonic diverticulosis, aortic and branch  vessell atherosclerosis no adenopathy, normal uterus and adnexa, advanced LS spondylosis grade 1 L4/5 and L5/S1 anterolisthesis are similar, pelvic floor laxity, hiatal hernia  CXR 02/26/18 thoracic aorta mildly atherosclerotic unchanged, Deg changes T spine, Deg changes b/l shoulders prior right shoulder surgery calcification distal left supraspinatus tendon at its insertion on the great tuberosity of the left humerus   UA normal 07/09/18  04/23/18 A1c 6.0  04/23/18 CBC 6.2/14.2/42.9/plts 199  04/23/18 CMET Cr 0.88 BUN 20 EGFR 62, total bili chronically elevated 1.1 (prior direct bili low 0.1 likely unconjugated high) AST15, ALT 14 normal  04/23/18 lipidTC 155, TG 143, HDL 68, LDL 58 10/17/17 vitamin D 41.1 04/18/16 33.7  TSH 06/29/17 2.06  FOBT neg x 3 05/19/16  -we discussed possible serious and likely etiologies, options for evaluation and workup, limitations of telemedicine visit vs in person visit, treatment, treatment risks and precautions. Pt prefers to treat via telemedicine empirically rather then risking or undertaking an in person visit at this moment. Patient agrees to seek prompt in person care if worsening, new symptoms arise, or if is not improving with treatment.   I discussed the assessment and treatment plan with the patient. The patient was provided an opportunity to ask questions and all were answered. The patient agreed with the plan and demonstrated an understanding of the instructions.   The patient was advised to call back or seek an in-person evaluation if the symptoms worsen or if the condition fails to improve as anticipated.  Time spent 15-20 minutes Delorise Jackson, MD

## 2019-09-05 ENCOUNTER — Other Ambulatory Visit: Payer: PPO

## 2019-09-09 ENCOUNTER — Other Ambulatory Visit: Payer: Self-pay

## 2019-09-09 ENCOUNTER — Other Ambulatory Visit (INDEPENDENT_AMBULATORY_CARE_PROVIDER_SITE_OTHER): Payer: PPO

## 2019-09-09 ENCOUNTER — Telehealth: Payer: Self-pay | Admitting: Internal Medicine

## 2019-09-09 DIAGNOSIS — E785 Hyperlipidemia, unspecified: Secondary | ICD-10-CM | POA: Diagnosis not present

## 2019-09-09 DIAGNOSIS — Z1389 Encounter for screening for other disorder: Secondary | ICD-10-CM

## 2019-09-09 DIAGNOSIS — Z1211 Encounter for screening for malignant neoplasm of colon: Secondary | ICD-10-CM

## 2019-09-09 DIAGNOSIS — I6523 Occlusion and stenosis of bilateral carotid arteries: Secondary | ICD-10-CM

## 2019-09-09 DIAGNOSIS — R030 Elevated blood-pressure reading, without diagnosis of hypertension: Secondary | ICD-10-CM | POA: Diagnosis not present

## 2019-09-09 DIAGNOSIS — E038 Other specified hypothyroidism: Secondary | ICD-10-CM | POA: Diagnosis not present

## 2019-09-09 DIAGNOSIS — R7303 Prediabetes: Secondary | ICD-10-CM

## 2019-09-09 LAB — COMPREHENSIVE METABOLIC PANEL
ALT: 15 U/L (ref 0–35)
AST: 15 U/L (ref 0–37)
Albumin: 4.7 g/dL (ref 3.5–5.2)
Alkaline Phosphatase: 72 U/L (ref 39–117)
BUN: 25 mg/dL — ABNORMAL HIGH (ref 6–23)
CO2: 24 mEq/L (ref 19–32)
Calcium: 9.9 mg/dL (ref 8.4–10.5)
Chloride: 104 mEq/L (ref 96–112)
Creatinine, Ser: 0.93 mg/dL (ref 0.40–1.20)
GFR: 58.19 mL/min — ABNORMAL LOW (ref >60.00)
Glucose, Bld: 125 mg/dL — ABNORMAL HIGH (ref 70–99)
Potassium: 3.9 mEq/L (ref 3.5–5.1)
Sodium: 140 mEq/L (ref 135–145)
Total Bilirubin: 1.2 mg/dL (ref 0.2–1.2)
Total Protein: 7.2 g/dL (ref 6.0–8.3)

## 2019-09-09 LAB — LIPID PANEL
Cholesterol: 162 mg/dL (ref 0–200)
HDL: 83.5 mg/dL (ref >39.00)
LDL Cholesterol: 60 mg/dL (ref 0–99)
NonHDL: 78.65
Total CHOL/HDL Ratio: 2
Triglycerides: 91 mg/dL (ref 0.0–149.0)
VLDL: 18.2 mg/dL (ref 0.0–40.0)

## 2019-09-09 LAB — HEMOGLOBIN A1C: Hgb A1c MFr Bld: 6 % (ref 4.6–6.5)

## 2019-09-09 LAB — CBC WITH DIFFERENTIAL/PLATELET
Basophils Absolute: 0 10*3/uL (ref 0.0–0.1)
Basophils Relative: 0.8 % (ref 0.0–3.0)
Eosinophils Absolute: 0.2 10*3/uL (ref 0.0–0.7)
Eosinophils Relative: 3.6 % (ref 0.0–5.0)
HCT: 42.2 % (ref 36.0–46.0)
Hemoglobin: 14.1 g/dL (ref 12.0–15.0)
Lymphocytes Relative: 27.7 % (ref 12.0–46.0)
Lymphs Abs: 1.5 10*3/uL (ref 0.7–4.0)
MCHC: 33.3 g/dL (ref 30.0–36.0)
MCV: 90.1 fl (ref 78.0–100.0)
Monocytes Absolute: 0.5 10*3/uL (ref 0.1–1.0)
Monocytes Relative: 8.6 % (ref 3.0–12.0)
Neutro Abs: 3.3 10*3/uL (ref 1.4–7.7)
Neutrophils Relative %: 59.3 % (ref 43.0–77.0)
Platelets: 201 10*3/uL (ref 150.0–400.0)
RBC: 4.69 Mil/uL (ref 3.87–5.11)
RDW: 13.6 % (ref 11.5–15.5)
WBC: 5.6 10*3/uL (ref 4.0–10.5)

## 2019-09-09 LAB — TSH: TSH: 3.44 u[IU]/mL (ref 0.35–4.50)

## 2019-09-09 NOTE — Telephone Encounter (Signed)
Pt came into office for a lab appt. She said she that her ears were hurting so bad she couldn't sleep the other night so she started taking an old antibiotic that she had called clindamycin. She wanted to see if she could get an appt with Dr. Olivia Mackie to check this out. Please advise.  Pt also dropped off BP & oxygen results along with copy of covid-19 vaccination record card. Placed in folder to be delivered to Dr. Audrie Gallus inbox.

## 2019-09-09 NOTE — Telephone Encounter (Signed)
Please advise 

## 2019-09-09 NOTE — Telephone Encounter (Signed)
Called and informed the patient of the below. Patient verbalized understanding Patient said that with taking the antibiotic her ears felt better. I informed the patient that she should still see ENT to be evaluated and make sure the antibiotic cleared things up fully.  Patient verbalized understanding

## 2019-09-09 NOTE — Telephone Encounter (Signed)
She is established with Dr. Tami Ribas ENT  Please have her call his office for an appt in person if she can   [name]

## 2019-09-10 ENCOUNTER — Other Ambulatory Visit: Payer: Self-pay | Admitting: Internal Medicine

## 2019-09-10 DIAGNOSIS — I10 Essential (primary) hypertension: Secondary | ICD-10-CM

## 2019-09-10 LAB — URINALYSIS, ROUTINE W REFLEX MICROSCOPIC
Bacteria, UA: NONE SEEN /HPF
Bilirubin Urine: NEGATIVE
Glucose, UA: NEGATIVE
Hgb urine dipstick: NEGATIVE
Hyaline Cast: NONE SEEN /LPF
Ketones, ur: NEGATIVE
Nitrite: NEGATIVE
Protein, ur: NEGATIVE
RBC / HPF: NONE SEEN /HPF (ref 0–2)
Specific Gravity, Urine: 1.017 (ref 1.001–1.03)
Squamous Epithelial / HPF: NONE SEEN /HPF (ref <=5)
WBC, UA: NONE SEEN /HPF (ref 0–5)
pH: <=5 (ref 5.0–8.0)

## 2019-09-10 MED ORDER — AMLODIPINE BESYLATE 5 MG PO TABS: 5.0000 mg | ORAL_TABLET | Freq: Every day | ORAL | 3 refills | Status: DC

## 2019-09-10 NOTE — Telephone Encounter (Signed)
Dr Olivia Mackie reviewed patient's BP and oxygen levels that were dropped off. While talking with patient about recent lab results, patient was informed to increase amlodipine to 5 mg from 2.5 mg. Patient has 2.5 mg pills on hand. She will take 2 of these at a time until she runs out.  Patient will keep a check on her BP.

## 2019-09-11 ENCOUNTER — Telehealth: Payer: Self-pay | Admitting: Internal Medicine

## 2019-09-11 NOTE — Telephone Encounter (Signed)
Pt needs a call back about clar

## 2019-09-11 NOTE — Telephone Encounter (Signed)
Patient called back in for clarification on why she needed to increase water intake to 50 and avoid NSAID'S. Informed patient that her BUN was elevated to 25 and what this level meant.  Patient verbalized understanding and states she was concerned because her sister has problems with her kidneys and the patient wanted to make sure her own kidneys were fine.

## 2019-10-01 ENCOUNTER — Telehealth: Payer: Self-pay | Admitting: Internal Medicine

## 2019-10-01 NOTE — Telephone Encounter (Signed)
Please advise 

## 2019-10-01 NOTE — Telephone Encounter (Signed)
Pt called  Last night her BP was 164/68 and today it was 140/78 Pt wanted to know if her BP medication needs to be changed

## 2019-10-02 ENCOUNTER — Telehealth: Payer: Self-pay | Admitting: Internal Medicine

## 2019-10-02 ENCOUNTER — Other Ambulatory Visit: Payer: Self-pay | Admitting: Internal Medicine

## 2019-10-02 DIAGNOSIS — I10 Essential (primary) hypertension: Secondary | ICD-10-CM

## 2019-10-02 MED ORDER — OLMESARTAN MEDOXOMIL 20 MG PO TABS: 20.0000 mg | ORAL_TABLET | Freq: Every day | ORAL | 3 refills | Status: DC

## 2019-10-02 NOTE — Telephone Encounter (Signed)
Patient informed and verbalized understanding.  Went over how to take the new medication with the patient. She wrote this down and repeated this to me.  She will call back in 2 weeks.

## 2019-10-02 NOTE — Telephone Encounter (Signed)
[name], Leah Guiles 54 minutes ago (10:31 AM)  KS   Patient called and said she was returning office's call. She did not get the name or what the call was about. Please call her.

## 2019-10-02 NOTE — Telephone Encounter (Signed)
BP still too elevated  Continue norvasc 5 mg daily Add benicar 20 mg take 1/2 pill x 3 days then 1 pill daily starting day 4  Call back in 1-2 weeks with BP readings   [place]

## 2019-10-02 NOTE — Telephone Encounter (Signed)
Patient called and said she was returning office's call. She did not get the name or what the call was about. Please call her.

## 2019-10-02 NOTE — Telephone Encounter (Signed)
Message added to previous encounter.  °

## 2019-10-02 NOTE — Telephone Encounter (Signed)
Left message to return call 

## 2019-10-08 DIAGNOSIS — L570 Actinic keratosis: Secondary | ICD-10-CM | POA: Diagnosis not present

## 2019-10-17 ENCOUNTER — Telehealth: Payer: Self-pay | Admitting: Internal Medicine

## 2019-10-17 NOTE — Telephone Encounter (Signed)
Patient dropped off her BP readings. Readings are up front in Dr. Claris Gladden color folder.

## 2019-10-22 NOTE — Telephone Encounter (Signed)
Did you receive these?

## 2019-10-22 NOTE — Telephone Encounter (Signed)
I received on previously but not w/in the last 5 days

## 2019-10-25 ENCOUNTER — Telehealth: Payer: Self-pay | Admitting: Internal Medicine

## 2019-10-25 NOTE — Telephone Encounter (Signed)
Work in next week if able  Otherwise go to ED or urgent care

## 2019-10-25 NOTE — Telephone Encounter (Signed)
Pt called in and wants to know if she can be seen sooner than 11/08/19. She said now every time she gets up and walk she is having shortness of breath. She doesn't have any while were talking just when she has to walk.

## 2019-10-25 NOTE — Telephone Encounter (Signed)
Pt re-scheduled for 04/16 at 11:30 am for SOB.   Pt states that when leaning over or bending (such as putting on shoes and sock) she has severe abdominal/flank pain. Is now sore steadily through out the day. Pain is mainly on the right side underneath her ribs.

## 2019-10-25 NOTE — Telephone Encounter (Signed)
Please advise 

## 2019-10-31 DIAGNOSIS — C44729 Squamous cell carcinoma of skin of left lower limb, including hip: Secondary | ICD-10-CM | POA: Diagnosis not present

## 2019-10-31 DIAGNOSIS — L578 Other skin changes due to chronic exposure to nonionizing radiation: Secondary | ICD-10-CM | POA: Diagnosis not present

## 2019-10-31 DIAGNOSIS — D485 Neoplasm of uncertain behavior of skin: Secondary | ICD-10-CM | POA: Diagnosis not present

## 2019-10-31 DIAGNOSIS — L720 Epidermal cyst: Secondary | ICD-10-CM | POA: Diagnosis not present

## 2019-10-31 DIAGNOSIS — L570 Actinic keratosis: Secondary | ICD-10-CM | POA: Diagnosis not present

## 2019-10-31 DIAGNOSIS — D0471 Carcinoma in situ of skin of right lower limb, including hip: Secondary | ICD-10-CM | POA: Diagnosis not present

## 2019-11-01 ENCOUNTER — Encounter: Payer: Self-pay | Admitting: Internal Medicine

## 2019-11-01 ENCOUNTER — Telehealth: Payer: Self-pay | Admitting: Internal Medicine

## 2019-11-01 ENCOUNTER — Other Ambulatory Visit: Payer: Self-pay

## 2019-11-01 ENCOUNTER — Ambulatory Visit (INDEPENDENT_AMBULATORY_CARE_PROVIDER_SITE_OTHER): Payer: PPO | Admitting: Internal Medicine

## 2019-11-01 VITALS — BP 102/60 | HR 70 | Temp 97.5°F | Ht 66.0 in | Wt 185.0 lb

## 2019-11-01 DIAGNOSIS — I10 Essential (primary) hypertension: Secondary | ICD-10-CM | POA: Insufficient documentation

## 2019-11-01 DIAGNOSIS — R0602 Shortness of breath: Secondary | ICD-10-CM | POA: Diagnosis not present

## 2019-11-01 DIAGNOSIS — J984 Other disorders of lung: Secondary | ICD-10-CM | POA: Diagnosis not present

## 2019-11-01 DIAGNOSIS — E039 Hypothyroidism, unspecified: Secondary | ICD-10-CM

## 2019-11-01 DIAGNOSIS — R432 Parageusia: Secondary | ICD-10-CM

## 2019-11-01 DIAGNOSIS — Z1231 Encounter for screening mammogram for malignant neoplasm of breast: Secondary | ICD-10-CM

## 2019-11-01 DIAGNOSIS — J9801 Acute bronchospasm: Secondary | ICD-10-CM

## 2019-11-01 DIAGNOSIS — K7689 Other specified diseases of liver: Secondary | ICD-10-CM | POA: Diagnosis not present

## 2019-11-01 DIAGNOSIS — R160 Hepatomegaly, not elsewhere classified: Secondary | ICD-10-CM | POA: Diagnosis not present

## 2019-11-01 DIAGNOSIS — R7303 Prediabetes: Secondary | ICD-10-CM | POA: Diagnosis not present

## 2019-11-01 DIAGNOSIS — J8410 Pulmonary fibrosis, unspecified: Secondary | ICD-10-CM | POA: Diagnosis not present

## 2019-11-01 DIAGNOSIS — R1084 Generalized abdominal pain: Secondary | ICD-10-CM | POA: Diagnosis not present

## 2019-11-01 DIAGNOSIS — J84112 Idiopathic pulmonary fibrosis: Secondary | ICD-10-CM | POA: Insufficient documentation

## 2019-11-01 MED ORDER — ALBUTEROL SULFATE HFA 108 (90 BASE) MCG/ACT IN AERS: INHALATION_SPRAY | RESPIRATORY_TRACT | 11 refills | Status: DC

## 2019-11-01 MED ORDER — LEVOTHYROXINE SODIUM 50 MCG PO TABS: 50.0000 ug | ORAL_TABLET | Freq: Every day | ORAL | 3 refills | Status: DC

## 2019-11-01 MED ORDER — ALBUTEROL SULFATE HFA 108 (90 BASE) MCG/ACT IN AERS: 1.0000 | INHALATION_SPRAY | Freq: Four times a day (QID) | RESPIRATORY_TRACT | 11 refills | Status: DC | PRN

## 2019-11-01 MED ORDER — OLMESARTAN MEDOXOMIL 20 MG PO TABS: 10.0000 mg | ORAL_TABLET | Freq: Every day | ORAL | 3 refills | Status: DC

## 2019-11-01 NOTE — Patient Instructions (Addendum)
Call [facility] to set up pulmonary function tests due to shortness of breath   Goal BP <130/<80  Try albuterol inhaler 1-2 puffs before exercise    Prediabetes Eating Plan Prediabetes is a condition that causes blood sugar (glucose) levels to be higher than normal. This increases the risk for developing diabetes. In order to prevent diabetes from developing, your health care provider may recommend a diet and other lifestyle changes to help you:  Control your blood glucose levels.  Improve your cholesterol levels.  Manage your blood pressure. Your health care provider may recommend working with a diet and nutrition specialist (dietitian) to make a meal plan that is best for you. What are tips for following this plan? Lifestyle  Set weight loss goals with the help of your health care team. It is recommended that most people with prediabetes lose 7% of their current body weight.  Exercise for at least 30 minutes at least 5 days a week.  Attend a support group or seek ongoing support from a mental health counselor.  Take over-the-counter and prescription medicines only as told by your health care provider. Reading food labels  Read food labels to check the amount of fat, salt (sodium), and sugar in prepackaged foods. Avoid foods that have: ? Saturated fats. ? Trans fats. ? Added sugars.  Avoid foods that have more than 300 milligrams (mg) of sodium per serving. Limit your daily sodium intake to less than 2,300 mg each day. Shopping  Avoid buying pre-made and processed foods. Cooking  Cook with olive oil. Do not use butter, lard, or ghee.  Bake, broil, grill, or boil foods. Avoid frying. Meal planning   Work with your dietitian to develop an eating plan that is right for you. This may include: ? Tracking how many calories you take in. Use a food diary, notebook, or mobile application to track what you eat at each meal. ? Using the glycemic index (GI) to plan your  meals. The index tells you how quickly a food will raise your blood glucose. Choose low-GI foods. These foods take a longer time to raise blood glucose.  Consider following a Mediterranean diet. This diet includes: ? Several servings each day of fresh fruits and vegetables. ? Eating fish at least twice a week. ? Several servings each day of whole grains, beans, nuts, and seeds. ? Using olive oil instead of other fats. ? Moderate alcohol consumption. ? Eating small amounts of red meat and whole-fat dairy.  If you have high blood pressure, you may need to limit your sodium intake or follow a diet such as the DASH eating plan. DASH is an eating plan that aims to lower high blood pressure. What foods are recommended? The items listed below may not be a complete list. Talk with your dietitian about what dietary choices are best for you. Grains Whole grains, such as whole-wheat or whole-grain breads, crackers, cereals, and pasta. Unsweetened oatmeal. Bulgur. Barley. Quinoa. Brown rice. Corn or whole-wheat flour tortillas or taco shells. Vegetables Lettuce. Spinach. Peas. Beets. Cauliflower. Cabbage. Broccoli. Carrots. Tomatoes. Squash. Eggplant. Herbs. Peppers. Onions. Cucumbers. Brussels sprouts. Fruits Berries. Bananas. Apples. Oranges. Grapes. Papaya. Mango. Pomegranate. Kiwi. Grapefruit. Cherries. Meats and other protein foods Seafood. Poultry without skin. Lean cuts of pork and beef. Tofu. Eggs. Nuts. Beans. Dairy Low-fat or fat-free dairy products, such as yogurt, cottage cheese, and cheese. Beverages Water. Tea. Coffee. Sugar-free or diet soda. Seltzer water. Lowfat or no-fat milk. Milk alternatives, such as soy or almond  milk. Fats and oils Olive oil. Canola oil. Sunflower oil. Grapeseed oil. Avocado. Walnuts. Sweets and desserts Sugar-free or low-fat pudding. Sugar-free or low-fat ice cream and other frozen treats. Seasoning and other foods Herbs. Sodium-free spices. Mustard. Relish.  Low-fat, low-sugar ketchup. Low-fat, low-sugar barbecue sauce. Low-fat or fat-free mayonnaise. What foods are not recommended? The items listed below may not be a complete list. Talk with your dietitian about what dietary choices are best for you. Grains Refined white flour and flour products, such as bread, pasta, snack foods, and cereals. Vegetables Canned vegetables. Frozen vegetables with butter or cream sauce. Fruits Fruits canned with syrup. Meats and other protein foods Fatty cuts of meat. Poultry with skin. Breaded or fried meat. Processed meats. Dairy Full-fat yogurt, cheese, or milk. Beverages Sweetened drinks, such as sweet iced tea and soda. Fats and oils Butter. Lard. Ghee. Sweets and desserts Baked goods, such as cake, cupcakes, pastries, cookies, and cheesecake. Seasoning and other foods Spice mixes with added salt. Ketchup. Barbecue sauce. Mayonnaise. Summary  To prevent diabetes from developing, you may need to make diet and other lifestyle changes to help control blood sugar, improve cholesterol levels, and manage your blood pressure.  Set weight loss goals with the help of your health care team. It is recommended that most people with prediabetes lose 7 percent of their current body weight.  Consider following a Mediterranean diet that includes plenty of fresh fruits and vegetables, whole grains, beans, nuts, seeds, fish, lean meat, low-fat dairy, and healthy oils. This information is not intended to replace advice given to you by your health care provider. Make sure you discuss any questions you have with your health care provider. Document Revised: 10/26/2018 Document Reviewed: 09/07/2016 Elsevier Patient Education  2020 [employer].

## 2019-11-01 NOTE — Progress Notes (Signed)
Chief Complaint  Patient presents with  . Shortness of Breath  . Follow-up  . Blood Pressure Check   F/u  1. HTN BP low normal today and c/o lightheadedness at times on norvasc 5 mg qd and benicar 20 mg qd  BP log reviewed BP 120-149/60s-80s from 3/25 to 10/16/19  2. Prediabetes A1C 6.0 ed diet and exercise  3. H/o nmsc and leg bx x 2 10/31/19 with dermatology right anterior leg and left lateral leg no report back yet she will use topical chemo to lowe legs in future  4. C/o RUQ ab pain mild to moder reviewed CT scan in 2019 with liver cysts and hepatomegagly s/p GB removal  5. C/o abnormal taste sweet candy tastes bitter or certain foods taste salty and they are not she wonders if she had covid 19 prev. In fall 2019 but disc will be hard to tell since she had covid vaccine and rec ENT in the future  6. Sob with exertion CT 2020 with lung scarring and fibrosis and CAD will f/u [facility] pulm Dr. Patsey Berthold and rec she get pfts   Review of Systems  Constitutional: Negative for weight loss.  HENT: Negative for hearing loss.   Eyes: Negative for blurred vision.  Respiratory: Positive for shortness of breath.   Cardiovascular: Negative for chest pain.  Gastrointestinal: Positive for abdominal pain. Negative for constipation, diarrhea, nausea and vomiting.  Musculoskeletal: Negative for falls.  Skin: Negative for rash.  Psychiatric/Behavioral: Negative for memory loss.   Past Medical History:  Diagnosis Date  . Acid reflux   . Allergy   . Anemia   . Anxiety    in past  . Arthritis    all over  . Atypical moles   . Bronchitis   . Bronchospasm    reactive with perfume/cologne and smoke  . CAD (coronary artery disease)    patient denies on preop of 10/24   . Candida esophagitis ([place])   . Carotid artery calcification, bilateral 04/2016  . Cataract   . Cervical stenosis of spine   . Chronic insomnia   . Complication of anesthesia    shoulder surgery- not all the way asleep  . COPD  (chronic obstructive pulmonary disease) (HCC)    chronic bronchitis   . Depression    in past  . Diverticulitis   . Diverticulosis   . Dizziness   . Elevated liver enzymes   . Family history of adverse reaction to anesthesia    sister- has problems with nausea and vomiting   . Fatigue   . Fatty liver   . Generalized headache    migraines  . GERD (gastroesophageal reflux disease)   . History of chemotherapy    topical cream for h/o skin CA  . History of kidney stones   . History of neuropathy    FH Dr. Posey Pronto neurology  . History of vertigo   . Hx of migraines   . Hyperlipidemia   . Hypothyroidism   . Insomnia   . Irritable bowel syndrome   . LLQ abdominal pain   . Melanoma ([place]) 2013  . Melanoma ([place])    x2 stg II melanoma  . Neuropathy, peripheral   . Peripheral neuropathy   . Pneumonia    hx of x 2  . PONV (postoperative nausea and vomiting)   . Pre-diabetes   . Retina disorder    left  . SCC (squamous cell carcinoma)    skin   . Stroke [facility])    ?  TIA per Dr V per patient   . Thyroid disease   . Urine incontinence   . Vitamin D deficiency   . Weight gain    Past Surgical History:  Procedure Laterality Date  . BILATERAL SALPINGOOPHORECTOMY     ovarian cyst Dr. Ouida Sills and Dr. Alisia Ferrari 2008  . BREAST SURGERY     in 2014 bx   . BREAST SURGERY     augmentation with saline Dr. Hubbard Hartshorn 1995   . CHOLECYSTECTOMY N/A 01/28/2015   Procedure: LAPAROSCOPIC CHOLECYSTECTOMY WITH INTRAOPERATIVE CHOLANGIOGRAM;  Surgeon: Autumn Messing III, MD;  Location: [facility];  Service: General;  Laterality: N/A;  . CLOSED MANIPULATION SHOULDER     rt  . COLONOSCOPY WITH PROPOFOL N/A 11/20/2012   Procedure: COLONOSCOPY WITH PROPOFOL;  Surgeon: Garlan Fair, MD;  Location: [facility];  Service: Endoscopy;  Laterality: N/A;  . colonscopy     May 2014  . JOINT REPLACEMENT     RTK  . KNEE ARTHROSCOPY    . left knee miniscus tear repair    . MELANOMA EXCISION WITH SENTINEL LYMPH  NODE BIOPSY  03/18/11   Back, nodes neg  . OOPHORECTOMY    . ROTATOR CUFF REPAIR     left  . SHOULDER SURGERY     right and left   . TOTAL HIP ARTHROPLASTY    . TOTAL KNEE ARTHROPLASTY     right 2010 and left in 2018   . TOTAL KNEE ARTHROPLASTY Left 05/16/2017   Procedure: LEFT TOTAL KNEE ARTHROPLASTY;  Surgeon: Paralee Cancel, MD;  Location: [facility];  Service: Orthopedics;  Laterality: Left;  70 mins  . TUBAL LIGATION    . TUMOR REMOVAL     ovary   Family History  Problem Relation Age of Onset  . Hypertension Mother   . Stroke Mother   . Hyperlipidemia Mother   . Heart disease Father   . Diabetes Father   . Hypertension Father   . Hyperlipidemia Father   . Early death Father   . Diabetes Sister   . Heart disease Sister        heart murmur  . Arthritis Sister   . COPD Sister   . Hyperlipidemia Sister   . Hypertension Sister   . Pancreatic cancer Maternal Uncle   . Alzheimer's disease Paternal Aunt   . Colon cancer Maternal Grandfather   . Cancer Maternal Grandfather   . Early death Maternal Grandfather   . Alzheimer's disease Paternal Aunt   . Alzheimer's disease Paternal Aunt   . Colon polyps Sister        adenomous  . Arthritis Sister   . Heart disease Sister   . Hyperlipidemia Sister   . Hypertension Sister   . Cancer Sister   . Heart disease Daughter   . Hyperlipidemia Daughter   . Hypertension Daughter   . Heart disease Son   . Hyperlipidemia Son   . Alcohol abuse Son   . Depression Son   . Diabetes Son   . Stroke Maternal Grandmother   . Early death Paternal Grandmother   . Stroke Paternal Grandmother   . Cancer Other        2 paternal uncles and 1 maternal uncle colon cancer  . Alzheimer's disease Paternal Aunt    Social History   Socioeconomic History  . Marital status: Divorced    Spouse name: Not on file  . Number of children: 3  . Years of education: College  . Highest education  level: Some college, no degree  Occupational History  .  Occupation: Retired    Fish farm manager: RETIRED  Tobacco Use  . Smoking status: Never Smoker  . Smokeless tobacco: Never Used  Substance and Sexual Activity  . Alcohol use: Yes    Alcohol/week: 1.0 standard drinks    Types: 1 Cans of beer per week    Comment: once a week beer or wine  . Drug use: No  . Sexual activity: Not Currently  Other Topics Concern  . Not on file  Social History Narrative   Patient lives at home alone in a one story home.   Has 3 children (2 daughters and 1 son)    Retired from [employer]   Divorced since 1992    Caffeine Use: 1 cup daily   No guns    Wears seat belt    Lives with cat   Social Determinants of Health   Financial Resource Strain: Low Risk   . Difficulty of Paying Living Expenses: Not hard at all  Food Insecurity: No Food Insecurity  . Worried About Charity fundraiser in the Last Year: Never true  . Ran Out of Food in the Last Year: Never true  Transportation Needs: No Transportation Needs  . Lack of Transportation (Medical): No  . Lack of Transportation (Non-Medical): No  Physical Activity:   . Days of Exercise per Week:   . Minutes of Exercise per Session:   Stress: No Stress Concern Present  . Feeling of Stress : Only a little  Social Connections:   . Frequency of Communication with Friends and Family:   . Frequency of Social Gatherings with Friends and Family:   . Attends Religious Services:   . Active Member of Clubs or Organizations:   . Attends Archivist Meetings:   Marland Kitchen Marital Status:   Intimate Partner Violence: Not At Risk  . Fear of Current or Ex-Partner: No  . Emotionally Abused: No  . Physically Abused: No  . Sexually Abused: No   Current Meds  Medication Sig  . amLODipine (NORVASC) 5 MG tablet Take 1 tablet (5 mg total) by mouth daily. In am with food  . Ascorbic Acid (VITAMIN C) 1000 MG tablet Take 1,000 mg by mouth daily.  Marland Kitchen aspirin 81 MG tablet TAKE 1 TABLET BY MOUTH EVERY DAY  .  atorvastatin (LIPITOR) 10 MG tablet Take 10 mg daily by mouth.   . Calcium 600-200 MG-UNIT tablet Take 1 tablet daily by mouth.  . Cholecalciferol (VITAMIN D3) 2000 UNITS capsule Take 2,000 Units by mouth daily.  Marland Kitchen FOLIC ACID PO Take by mouth daily.  Marland Kitchen levothyroxine (SYNTHROID) 50 MCG tablet Take 1 tablet (50 mcg total) by mouth daily before breakfast.  . olmesartan (BENICAR) 20 MG tablet Take 0.5 tablets (10 mg total) by mouth daily. If BP>130/>80 take another 1/2 pill  . Omega-3 Fatty Acids (FISH OIL) 1000 MG CAPS Take 1,000 mg by mouth daily.   Marland Kitchen omeprazole (PRILOSEC) 20 MG capsule Take 1 capsule (20 mg total) by mouth 2 (two) times daily before a meal.  . sertraline (ZOLOFT) 25 MG tablet Take 25 mg by mouth daily.  . vitamin B-12 (CYANOCOBALAMIN) 1000 MCG tablet Take 2,000 mcg daily by mouth. 2 tabs  . Zinc 50 MG CAPS Take 50 mg by mouth daily.  . [DISCONTINUED] levothyroxine (SYNTHROID) 50 MCG tablet Take 1 tablet (50 mcg total) by mouth daily before breakfast.  . [DISCONTINUED] olmesartan (BENICAR) 20 MG tablet  Take 1 tablet (20 mg total) by mouth daily.  . [DISCONTINUED] olmesartan (BENICAR) 20 MG tablet Take 0.5 tablets (10 mg total) by mouth daily. If BP>130/>80 take another 1/2 pill   Allergies  Allergen Reactions  . Epinephrine Other (See Comments) and Anaphylaxis    Heart palpitations  . Penicillins Swelling and Other (See Comments)    Lip swelling Has patient had a PCN reaction causing immediate rash, facial/tongue/throat swelling, SOB or lightheadedness with hypotension: Yes Has patient had a PCN reaction causing severe rash involving mucus membranes or skin necrosis: No Has patient had a PCN reaction that required hospitalization No Has patient had a PCN reaction occurring within the last 10 years: No If all of the above answers are "NO", then may proceed with Cephalosporin use.  . Lidocaine Hcl Other (See Comments)  . Other     Black pepper- caused bronchial tubes to start  closing   . Valium [Diazepam] Other (See Comments)    Other reaction(s): Other (See Comments) Overly sensitive  sedation   Recent Results (from the past 2160 hour(s))  Novel Coronavirus, NAA ([facility])     Status: None   Collection Time: 08/19/19  3:21 PM   Specimen: Oropharyngeal(OP) collection in vial transport medium   OROPHARYNGEA  TESTING  Result Value Ref Range   SARS-CoV-2, NAA Not Detected Not Detected    Comment: This nucleic acid amplification test was developed and its performance characteristics determined by [employer]. Nucleic acid amplification tests include RT-PCR and TMA. This test has not been FDA cleared or approved. This test has been authorized by FDA under an Emergency Use Authorization (EUA). This test is only authorized for the duration of time the declaration that circumstances exist justifying the authorization of the emergency use of in vitro diagnostic tests for detection of SARS-CoV-2 virus and/or diagnosis of COVID-19 infection under section 564(b)(1) of the Act, 21 U.S.C. 292KMQ-2(M) (1), unless the authorization is terminated or revoked sooner. When diagnostic testing is negative, the possibility of a false negative result should be considered in the context of a patient's recent exposures and the presence of clinical signs and symptoms consistent with COVID-19. An individual without symptoms of COVID-19 and who is not shedding SARS-CoV-2 virus wo uld expect to have a negative (not detected) result in this assay.   Urinalysis, Routine w reflex microscopic     Status: Abnormal   Collection Time: 09/09/19 10:15 AM  Result Value Ref Range   Color, Urine YELLOW YELLOW   APPearance CLEAR CLEAR   Specific Gravity, Urine 1.017 1.001 - 1.03   pH < OR = 5.0 5.0 - 8.0   Glucose, UA NEGATIVE NEGATIVE   Bilirubin Urine NEGATIVE NEGATIVE   Ketones, ur NEGATIVE NEGATIVE   Hgb urine dipstick NEGATIVE NEGATIVE   Protein, ur NEGATIVE NEGATIVE    Nitrite NEGATIVE NEGATIVE   Leukocytes,Ua TRACE (A) NEGATIVE   WBC, UA NONE SEEN 0 - 5 /HPF   RBC / HPF NONE SEEN 0 - 2 /HPF   Squamous Epithelial / LPF NONE SEEN < OR = 5 /HPF   Bacteria, UA NONE SEEN NONE SEEN /HPF   Calcium Oxalate Crystal FEW NONE OR FE /HPF   Hyaline Cast NONE SEEN NONE SEEN /LPF  TSH     Status: None   Collection Time: 09/09/19 10:15 AM  Result Value Ref Range   TSH 3.44 0.35 - 4.50 uIU/mL  Hemoglobin A1c     Status: None   Collection Time: 09/09/19 10:15 AM  Result Value Ref Range   Hgb A1c MFr Bld 6.0 4.6 - 6.5 %    Comment: Glycemic Control Guidelines for People with Diabetes:Non Diabetic:  <6%Goal of Therapy: <7%Additional Action Suggested:  >8%   CBC with Differential/Platelet     Status: None   Collection Time: 09/09/19 10:15 AM  Result Value Ref Range   WBC 5.6 4.0 - 10.5 K/uL   RBC 4.69 3.87 - 5.11 Mil/uL   Hemoglobin 14.1 12.0 - 15.0 g/dL   HCT 42.2 36.0 - 46.0 %   MCV 90.1 78.0 - 100.0 fl   MCHC 33.3 30.0 - 36.0 g/dL   RDW 13.6 11.5 - 15.5 %   Platelets 201.0 150.0 - 400.0 K/uL   Neutrophils Relative % 59.3 43.0 - 77.0 %   Lymphocytes Relative 27.7 12.0 - 46.0 %   Monocytes Relative 8.6 3.0 - 12.0 %   Eosinophils Relative 3.6 0.0 - 5.0 %   Basophils Relative 0.8 0.0 - 3.0 %   Neutro Abs 3.3 1.4 - 7.7 K/uL   Lymphs Abs 1.5 0.7 - 4.0 K/uL   Monocytes Absolute 0.5 0.1 - 1.0 K/uL   Eosinophils Absolute 0.2 0.0 - 0.7 K/uL   Basophils Absolute 0.0 0.0 - 0.1 K/uL  Comprehensive metabolic panel     Status: Abnormal   Collection Time: 09/09/19 10:15 AM  Result Value Ref Range   Sodium 140 135 - 145 mEq/L   Potassium 3.9 3.5 - 5.1 mEq/L   Chloride 104 96 - 112 mEq/L   CO2 24 19 - 32 mEq/L   Glucose, Bld 125 (H) 70 - 99 mg/dL   BUN 25 (H) 6 - 23 mg/dL   Creatinine, Ser 0.93 0.40 - 1.20 mg/dL   Total Bilirubin 1.2 0.2 - 1.2 mg/dL   Alkaline Phosphatase 72 39 - 117 U/L   AST 15 0 - 37 U/L   ALT 15 0 - 35 U/L   Total Protein 7.2 6.0 - 8.3 g/dL    Albumin 4.7 3.5 - 5.2 g/dL   GFR 58.19 (L) >60.00 mL/min   Calcium 9.9 8.4 - 10.5 mg/dL  Lipid panel     Status: None   Collection Time: 09/09/19 10:15 AM  Result Value Ref Range   Cholesterol 162 0 - 200 mg/dL    Comment: ATP III Classification       Desirable:  < 200 mg/dL               Borderline High:  200 - 239 mg/dL          High:  > = 240 mg/dL   Triglycerides 91.0 0.0 - 149.0 mg/dL    Comment: Normal:  <150 mg/dLBorderline High:  150 - 199 mg/dL   HDL 83.50 >39.00 mg/dL   VLDL 18.2 0.0 - 40.0 mg/dL   LDL Cholesterol 60 0 - 99 mg/dL   Total CHOL/HDL Ratio 2     Comment:                Men          Women1/2 Average Risk     3.4          3.3Average Risk          5.0          4.42X Average Risk          9.6          7.13X Average Risk          15.0  11.0                       NonHDL 78.65     Comment: NOTE:  Non-HDL goal should be 30 mg/dL higher than patient's LDL goal (i.e. LDL goal of < 70 mg/dL, would have non-HDL goal of < 100 mg/dL)   Objective  Body mass index is 29.86 kg/m. Wt Readings from Last 3 Encounters:  11/01/19 185 lb (83.9 kg)  08/27/19 180 lb (81.6 kg)  08/13/19 180 lb (81.6 kg)   Temp Readings from Last 3 Encounters:  11/01/19 (!) 97.5 F (36.4 C) (Temporal)  05/31/19 98.6 F (37 C) (Oral)  05/30/19 (!) 97.1 F (36.2 C) (Temporal)   BP Readings from Last 3 Encounters:  11/01/19 102/60  08/27/19 (!) 156/89  05/31/19 (!) 158/78   Pulse Readings from Last 3 Encounters:  11/01/19 70  08/27/19 66  05/31/19 62    Physical Exam Vitals and nursing note reviewed.  Constitutional:      Appearance: Normal appearance. She is well-developed and well-groomed. She is obese.  HENT:     Head: Normocephalic and atraumatic.  Eyes:     Pupils: Pupils are equal, round, and reactive to light.  Cardiovascular:     Rate and Rhythm: Normal rate and regular rhythm.     Heart sounds: Normal heart sounds.  Pulmonary:     Effort: Pulmonary effort is normal.      Breath sounds: Normal breath sounds. No decreased breath sounds.  Skin:    General: Skin is warm and dry.       Neurological:     General: No focal deficit present.     Mental Status: She is alert and oriented to person, place, and time.     Gait: Gait normal.  Psychiatric:        Attention and Perception: Attention and perception normal.        Mood and Affect: Mood and affect normal.        Speech: Speech normal.        Behavior: Behavior normal. Behavior is cooperative.        Thought Content: Thought content normal.        Cognition and Memory: Cognition and memory normal.        Judgment: Judgment normal.     Assessment  Plan  Prediabetes Healthy diet and exercise  Essential hypertension - Plan: olmesartan (BENICAR) 10 mg bid prn but 10 mg qd  Generalized abdominal pain - Plan: US Abdomen Complete Liver cyst - Plan: US Abdomen Complete Hepatomegaly - Plan: US Abdomen Complete  Pulmonary fibrosis/scarring (HCC) - Plan: albuterol (PROAIR HFA) 108 (90 Base) MCG/ACT inhaler -will have pt f/u Dr. Patsey Berthold for pfts   Hypothyroidism, unspecified type - Plan: levothyroxine (SYNTHROID) 50 MCG tablet  SOB (shortness of breath) on exertion ?pulm (fibrosis/lung scarring) or cardiac etiology - Plan: albuterol (PROAIR HFA) 108 (90 Base) MCG/ACT inhaler F/u Dr. Patsey Berthold pfts  CAD on imaging consider further w/u with cards Dr. Rockey Situ and echo appt 02/03/20  Things taste abnormal  Consider f/u ent in future  HM Flu shotgiven 04/2019 Tdap-had TD 10/18/12 not pertussis will need this in future shingrixwill need  zostervax had 06/16/11 prevnarhad 05/15/14 covid 2/2 vaccines utd  Pna 23had 12/16/05, 05/18/16  Pap out of age window LMP 26s   [place] with implants6/30/2020 normal -10/18/16 screening with implants neg FH breast cancer p cousins dx'ed in 38s x 3, sister Had at  age 43 y.o and colon cancer age 72, 2 paternal cousins ovarian cancer age 11 and 17    Colonoscopy 11/2012 tortuous colon no further rec. -Consider cologuard in future if no h/o polypsordered today 8/4/2020still not done as of 11/01/19  DEXA -[facility] 06/28/16 normal reviewed report did not scan into chart -h/o vitamin D def on D3 2000 IU daily  [facility] Dr. Delman Cheadle mohs surgery F/u from 04/10/19 left lower leg cellulitis for SCC changed to levaquin rec compression socks and acidic acid soaks qhs   05/15/19 f/u appt s/p SCC mohs removal f/u in 3 months Dr. Winifred Olive  [facility] saw 01/01/19 increased nocturia and urgency pending Botox to tx and UDS Dr. Junious Silk and try myrbetriq before UDS  Cards-Dr. Rockey Situ  [facility]  Eye MD Dr. Cordelia Pen [facility]-retinal [facility]  Dentist Dr. Lynelle Smoke  Neurology-[facility]  ENT-Dr. Tami Ribas for vestibular balance issue s Podiatry Dr. Milinda Pointer  H/o D.r Earlie Server [facility]-Dr. Patsey Berthold    Dermatology Dr. Delman Cheadle in [place] h/o SCC left leg and MM right leg,neck and backwith node dissection Dr. Marlou Starks 8/12 ortho Dr. Alvan Dame  Former PCP Dr. Jossie Ng requested labs, all vaccines, DEXA -pt brought in all records week of 09/21/2018 and reviewed  -h/o iron def anemia  -last seen 07/09/18 and 08/09/18 possible thrush and URI  US carotid 05/13/16 <50% stenosis in right and left ICA -she was at this time put on lipitor 10 mg qhs   CT ab/pelvis 07/26/17 left base scarring lung, well circumcised liver lesion likely cysts and stable hepatomegaly 19.4 cm cranlocaudal pancreas/spleen normal, extensive colonic diverticulosis, aortic and branch vessell atherosclerosis no adenopathy, normal uterus and adnexa, advanced LS spondylosis grade 1 L4/5 and L5/S1 anterolisthesis are similar, pelvic floor laxity, hiatal hernia  CXR 02/26/18 thoracic aorta mildly atherosclerotic unchanged, Deg changes T spine, Deg changes b/l shoulders prior right shoulder surgery calcification distal left supraspinatus tendon at its insertion on the  great tuberosity of the left humerus   UA normal 07/09/18  04/23/18 A1c 6.0  04/23/18 CBC 6.2/14.2/42.9/plts 199  04/23/18 CMET Cr 0.88 BUN 20 EGFR 62, total bili chronically elevated 1.1 (prior direct bili low 0.1 likely unconjugated high) AST15, ALT 14 normal  04/23/18 lipidTC 155, TG 143, HDL 68, LDL 58 10/17/17 vitamin D 41.1 04/18/16 33.7  TSH 06/29/17 2.06  FOBT neg x 3 05/19/16   Provider: Dr. Olivia Mackie McLean-Scocuzza-Internal Medicine

## 2019-11-01 NOTE — Telephone Encounter (Signed)
I left vm for pt to call ofc to sch US Abdomen.

## 2019-11-04 ENCOUNTER — Telehealth: Payer: Self-pay | Admitting: Internal Medicine

## 2019-11-04 NOTE — Telephone Encounter (Signed)
Lm to call office to set up a 3 to 4 month follow up, (around July).

## 2019-11-05 ENCOUNTER — Ambulatory Visit
Admission: RE | Admit: 2019-11-05 | Discharge: 2019-11-05 | Disposition: A | Discharge status: Home / Self Care | Payer: PPO | Source: Ambulatory Visit | Attending: Internal Medicine | Admitting: Internal Medicine

## 2019-11-05 ENCOUNTER — Other Ambulatory Visit: Payer: Self-pay

## 2019-11-05 DIAGNOSIS — R1084 Generalized abdominal pain: Secondary | ICD-10-CM | POA: Insufficient documentation

## 2019-11-05 DIAGNOSIS — K7689 Other specified diseases of liver: Secondary | ICD-10-CM | POA: Diagnosis not present

## 2019-11-05 DIAGNOSIS — R160 Hepatomegaly, not elsewhere classified: Secondary | ICD-10-CM | POA: Diagnosis not present

## 2019-11-05 NOTE — Progress Notes (Signed)
Attempted to schedule.  LMOV to call office.  ° °

## 2019-11-08 ENCOUNTER — Ambulatory Visit: Payer: PPO | Admitting: Internal Medicine

## 2019-11-12 ENCOUNTER — Ambulatory Visit: Payer: PPO | Admitting: Internal Medicine

## 2019-11-18 ENCOUNTER — Telehealth: Payer: Self-pay | Admitting: Cardiovascular Disease

## 2019-11-18 NOTE — Telephone Encounter (Signed)
Attempted to schedule.  Patient in the car and will call right back.

## 2019-11-18 NOTE — Telephone Encounter (Signed)
-----   Message from [name] sent at 11/05/2019 11:55 AM EDT -----   ----- Message ----- From: Minna Merritts, MD Sent: 11/03/2019   9:27 AM EDT To: Daleen Bo Scheduling   ----- Message ----- From: McLean-Scocuzza, Nino Glow, MD Sent: 11/01/2019  12:24 PM EDT To: Minna Merritts, MD  Pt has f/u with you 02/03/20 has exertional sob sending to pulm for pfts if this is negative could it be cardiac with CAD noted previously? Also last echo in 2017  Can you help address pls thanks CT scan in 2020 with pulm scarring fibrosis

## 2019-11-22 NOTE — Progress Notes (Signed)
Patient has been scheduled for 12/12/19 at 11am

## 2019-11-25 ENCOUNTER — Ambulatory Visit: Payer: PPO | Admitting: Family

## 2019-11-26 DIAGNOSIS — C44729 Squamous cell carcinoma of skin of left lower limb, including hip: Secondary | ICD-10-CM | POA: Diagnosis not present

## 2019-11-26 DIAGNOSIS — D0471 Carcinoma in situ of skin of right lower limb, including hip: Secondary | ICD-10-CM | POA: Diagnosis not present

## 2019-12-03 DIAGNOSIS — G5601 Carpal tunnel syndrome, right upper limb: Secondary | ICD-10-CM | POA: Diagnosis not present

## 2019-12-03 DIAGNOSIS — M13842 Other specified arthritis, left hand: Secondary | ICD-10-CM | POA: Diagnosis not present

## 2019-12-03 DIAGNOSIS — G5603 Carpal tunnel syndrome, bilateral upper limbs: Secondary | ICD-10-CM | POA: Diagnosis not present

## 2019-12-03 DIAGNOSIS — G5602 Carpal tunnel syndrome, left upper limb: Secondary | ICD-10-CM

## 2019-12-03 DIAGNOSIS — M13841 Other specified arthritis, right hand: Secondary | ICD-10-CM | POA: Diagnosis not present

## 2019-12-03 DIAGNOSIS — M79642 Pain in left hand: Secondary | ICD-10-CM | POA: Diagnosis not present

## 2019-12-03 DIAGNOSIS — M19049 Primary osteoarthritis, unspecified hand: Secondary | ICD-10-CM | POA: Insufficient documentation

## 2019-12-03 HISTORY — DX: Carpal tunnel syndrome, left upper limb: G56.02

## 2019-12-05 DIAGNOSIS — H2513 Age-related nuclear cataract, bilateral: Secondary | ICD-10-CM | POA: Diagnosis not present

## 2019-12-05 DIAGNOSIS — H35372 Puckering of macula, left eye: Secondary | ICD-10-CM | POA: Diagnosis not present

## 2019-12-05 DIAGNOSIS — H04123 Dry eye syndrome of bilateral lacrimal glands: Secondary | ICD-10-CM | POA: Insufficient documentation

## 2019-12-05 DIAGNOSIS — H43811 Vitreous degeneration, right eye: Secondary | ICD-10-CM | POA: Diagnosis not present

## 2019-12-05 DIAGNOSIS — H35363 Drusen (degenerative) of macula, bilateral: Secondary | ICD-10-CM | POA: Diagnosis not present

## 2019-12-11 DIAGNOSIS — Q828 Other specified congenital malformations of skin: Secondary | ICD-10-CM | POA: Diagnosis not present

## 2019-12-11 DIAGNOSIS — D2271 Melanocytic nevi of right lower limb, including hip: Secondary | ICD-10-CM | POA: Diagnosis not present

## 2019-12-11 DIAGNOSIS — L821 Other seborrheic keratosis: Secondary | ICD-10-CM | POA: Diagnosis not present

## 2019-12-11 DIAGNOSIS — L570 Actinic keratosis: Secondary | ICD-10-CM | POA: Diagnosis not present

## 2019-12-11 DIAGNOSIS — L723 Sebaceous cyst: Secondary | ICD-10-CM | POA: Diagnosis not present

## 2019-12-11 DIAGNOSIS — L578 Other skin changes due to chronic exposure to nonionizing radiation: Secondary | ICD-10-CM | POA: Diagnosis not present

## 2019-12-11 DIAGNOSIS — Z808 Family history of malignant neoplasm of other organs or systems: Secondary | ICD-10-CM | POA: Diagnosis not present

## 2019-12-11 DIAGNOSIS — Z87898 Personal history of other specified conditions: Secondary | ICD-10-CM | POA: Diagnosis not present

## 2019-12-11 DIAGNOSIS — D485 Neoplasm of uncertain behavior of skin: Secondary | ICD-10-CM | POA: Diagnosis not present

## 2019-12-11 DIAGNOSIS — Z85828 Personal history of other malignant neoplasm of skin: Secondary | ICD-10-CM | POA: Diagnosis not present

## 2019-12-12 ENCOUNTER — Other Ambulatory Visit: Payer: Self-pay

## 2019-12-12 ENCOUNTER — Encounter: Payer: Self-pay | Admitting: Pulmonary Disease

## 2019-12-12 ENCOUNTER — Ambulatory Visit: Payer: PPO | Admitting: Pulmonary Disease

## 2019-12-12 VITALS — BP 130/60 | HR 66 | Temp 98.3°F | Ht 66.0 in | Wt 183.0 lb

## 2019-12-12 DIAGNOSIS — K2100 Gastro-esophageal reflux disease with esophagitis, without bleeding: Secondary | ICD-10-CM

## 2019-12-12 DIAGNOSIS — R05 Cough: Secondary | ICD-10-CM

## 2019-12-12 DIAGNOSIS — R059 Cough, unspecified: Secondary | ICD-10-CM

## 2019-12-12 DIAGNOSIS — R0602 Shortness of breath: Secondary | ICD-10-CM

## 2019-12-12 DIAGNOSIS — I5189 Other ill-defined heart diseases: Secondary | ICD-10-CM

## 2019-12-12 NOTE — Progress Notes (Signed)
    Assessment & Plan:  1. Shortness of breath (Primary) - ECHOCARDIOGRAM COMPLETE; Future - Pulmonary Function Test [facility] Only; Future  2. Gastroesophageal reflux disease with esophagitis without hemorrhage  3. Diastolic dysfunction  4. Cough   Patient Instructions  We are going to get an echocardiogram to check your heart particularly since you have an upcoming appointment with Dr. Gollan   We are also going to schedule breathing tests.      Please note: late entry documentation due to logistical difficulties during COVID-19 pandemic. This note is filed for information purposes only, and is not intended to be used for billing, nor does it represent the full scope/nature of the visit in question. Please see any associated scanned media linked to date of encounter for additional pertinent information.  Subjective:    HPI: [name] is a 79 y.o. female presenting to the pulmonology clinic on 12/12/2019 with report of: Follow-up (Breathing is unchanged since the last visit. She is still coughing some. She rarely uses her albuterol . )     Outpatient Encounter Medications as of 12/12/2019  Medication Sig Note   aspirin  81 MG tablet TAKE 1 TABLET BY MOUTH EVERY DAY    Calcium  600-200 MG-UNIT tablet Take 1 tablet daily by mouth. (Patient not taking: Reported on 01/22/2024)    Cholecalciferol  (VITAMIN D3) 2000 UNITS capsule Take 2,000 Units by mouth daily. (Patient not taking: Reported on 01/22/2024)    FOLIC ACID  PO Take by mouth daily. (Patient not taking: Reported on 01/22/2024)    Omega-3 Fatty Acids (FISH OIL) 1000 MG CAPS Take 1,000 mg by mouth daily.  (Patient not taking: Reported on 01/22/2024)    vitamin B-12 (CYANOCOBALAMIN ) 1000 MCG tablet Take 2,000 mcg by mouth daily. 2 tabs (Patient not taking: Reported on 01/22/2024)    [DISCONTINUED] albuterol  (PROAIR  HFA) 108 (90 Base) MCG/ACT inhaler Inhale 1-2 puffs into the lungs every 6 (six) hours as needed for wheezing or shortness  of breath.    [DISCONTINUED] amLODipine  (NORVASC ) 5 MG tablet Take 1 tablet (5 mg total) by mouth daily. In am with food    [DISCONTINUED] Ascorbic Acid  (VITAMIN C ) 1000 MG tablet Take 1,000 mg by mouth daily.    [DISCONTINUED] atorvastatin  (LIPITOR) 10 MG tablet Take 10 mg daily by mouth.     [DISCONTINUED] levothyroxine  (SYNTHROID ) 50 MCG tablet Take 1 tablet (50 mcg total) by mouth daily before breakfast.    [DISCONTINUED] Magnesium  250 MG TABS Take 250 mg by mouth daily.    [DISCONTINUED] olmesartan  (BENICAR ) 20 MG tablet Take 0.5 tablets (10 mg total) by mouth daily. If BP>130/>80 take another 1/2 pill 11/06/2020: change to 5 mg    [DISCONTINUED] sertraline  (ZOLOFT ) 25 MG tablet Take 25 mg by mouth daily.    [DISCONTINUED] Zinc  50 MG CAPS Take 50 mg by mouth daily.    [DISCONTINUED] omeprazole  (PRILOSEC) 20 MG capsule Take 1 capsule (20 mg total) by mouth 2 (two) times daily before a meal.    No facility-administered encounter medications on file as of 12/12/2019.      Objective:   Vitals:   12/12/19 1104  BP: 130/60  Pulse: 66  Temp: 98.3 F (36.8 C)  Height: 5' 6 (1.676 m)  Weight: 183 lb (83 kg)  SpO2: 96%  TempSrc: Temporal  BMI (Calculated): 29.55     Physical exam documentation is limited by delayed entry of information.

## 2019-12-12 NOTE — Patient Instructions (Signed)
We are going to get an echocardiogram to check your heart particularly since you have an upcoming appointment with Dr. Rockey Situ   We are also going to schedule breathing tests.

## 2019-12-13 ENCOUNTER — Ambulatory Visit: Payer: PPO | Admitting: Family

## 2019-12-26 ENCOUNTER — Telehealth: Payer: Self-pay | Admitting: Gastroenterology

## 2019-12-26 ENCOUNTER — Telehealth: Payer: Self-pay | Admitting: Internal Medicine

## 2019-12-26 NOTE — Telephone Encounter (Signed)
Patient is scheduled to see Tye Savoy RNP tomorrow at 1:30.  I left a message for the patient to call back and confirm the appointment with me.

## 2019-12-26 NOTE — Telephone Encounter (Signed)
Pt called she has been having blood in her stool for the last several weeks. Dark and bright in color at different times  Pt is calling GI to make an appt  she just wanted to let Dr.Tracy know what was going on and what her thoughts were

## 2019-12-26 NOTE — Telephone Encounter (Signed)
She has an appointment to see [name] tomorrow. Thanks

## 2019-12-26 NOTE — Telephone Encounter (Signed)
Patient called and confirmed appointment

## 2019-12-26 NOTE — Telephone Encounter (Signed)
Pt states that she has been experiencing diarrhea and blood in her stool lately. Pt reports that it was dark pieces of blood initially but yesterday and today she noticed fresh blood in the stool and toilet paper. She is concerned because she has had melanoma few years ago. She would like some advise or an appt asap.

## 2019-12-26 NOTE — Telephone Encounter (Signed)
Yes rec f/u GI for this  tMS

## 2019-12-26 NOTE — Telephone Encounter (Signed)
For your information  

## 2019-12-27 ENCOUNTER — Encounter: Payer: Self-pay | Admitting: Nurse Practitioner

## 2019-12-27 ENCOUNTER — Ambulatory Visit: Payer: PPO | Admitting: Nurse Practitioner

## 2019-12-27 ENCOUNTER — Other Ambulatory Visit: Payer: PPO

## 2019-12-27 VITALS — BP 120/60 | HR 64 | Ht 63.75 in | Wt 186.0 lb

## 2019-12-27 DIAGNOSIS — K625 Hemorrhage of anus and rectum: Secondary | ICD-10-CM | POA: Diagnosis not present

## 2019-12-27 DIAGNOSIS — R195 Other fecal abnormalities: Secondary | ICD-10-CM

## 2019-12-27 NOTE — Progress Notes (Addendum)
IMPRESSION and PLAN:   79 yo female with PMH significant for CAD , hyperlipidemia , hypothyroidism , COPD, depression,  GERD, esophageal candidiasis, hiatal hernia , diverticulosis , melanoma  # Progressive bowel changes with a few recent episodes of painless rectal bleeding.  --Having 4-5 loose to unformed stools a day since April / occasional ribbon like stool. Doubt infectious but will check stool studies for completeness.  --Hemorrhoids on exam . Bleeding could be hemorrhoidal in the setting of frequent loose stools. Of course polyp, colon neoplasm or other etiologies not excluded.  --Last colonoscopy in 2014 by [facility].  It sounds to have been incomplete.  She had some postprocedure pain / complications prompting an ED evaluation.  Acute abdominal series negative for free air, showed air / fluid levels in small bowel. Patient has been opposed to having another colonoscopy since.  --First I will try and get 2014 colonoscopy report from [facility] to see if in fact the colon evaluation was incomplete.  --I will talk with Dr. Havery Moros about options. We could get CT scan to look for any colonic lesions. Patient will reconsider having a colonoscopy if Dr. Havery Moros recommends one.   Addendum:  Received last colonoscopy report from [facility].  Procedure done 10/04/2012 by Dr. Wynetta Emery.  The exam was complete, performance of the colonoscopy was technically, extremely difficult due to colonic loop formation.  Findings included extensive left-sided diverticulosis.  Dr. Havery Moros asked for an update of these findings so that he could decide on colonoscopy versus flexible sigmoidoscopy if patient willing to proceed.  I will forward results to him    HPI:    Primary GI: [place] [facility], MD   Chief complaint : Loose stool  04/26/19  Office visit with Dr. Havery Moros for loose stool which had already improved by the time of the visit.  We recommended Imodium as needed . her last  colonoscopy was in 2014 by [facility].  Apparently she has never had successful cecal intubation due to severe tortuosity.  At the time of her last colonoscopy patient was sent to the ED out of concern for perforation (no perfect on imaging).  Patient says she was told that she should not undergo another colonoscopy.  At the time of her visit patient was adamantly opposed to colonoscopy.  She had a Cologuard study at home.  Given that she was past the age for colon cancer screening , was adamantly opposed to having a colonoscopy and had complications related to the last one, we advised completing the Cologuard   Interval History:   Patient says the diarrhea never really has resolved since her last visit here October 2020.  Since April the diarrhea has gotten worse.  Bowel changes cannot be correlated with dietary changes nor medication changes.  Patient never did take the Imodium as it caused constipation. She is now having increased frequency of loose stools.  Averaging 4-5 bowel movements a day.  She does occasionally have formed, ribbonlike stools .  Three to 4 weeks ago patient saw what looked like a dark blood clot in her stool.  Yesterday and the day before she had frank blood in her stool..  No abdominal pain associated with bowel movements.  No significant nausea nor vomiting.  No weight loss, her weight is up 6 pounds since her last visit.    Review of systems:     No chest pain, no SOB, no fevers, no urinary sx  Past Medical History:  Diagnosis Date  . Acid reflux   . Allergy   . Anemia   . Anxiety    in past  . Arthritis    all over  . Atypical moles   . Bronchitis   . Bronchospasm    reactive with perfume/cologne and smoke  . CAD (coronary artery disease)    patient denies on preop of 10/24   . Candida esophagitis ([place])   . Carotid artery calcification, bilateral 04/2016  . Cataract   . Cervical stenosis of spine   . Chronic insomnia   . Complication of anesthesia    shoulder  surgery- not all the way asleep  . COPD (chronic obstructive pulmonary disease) (HCC)    chronic bronchitis   . Depression    in past  . Diverticulitis   . Diverticulosis   . Dizziness   . Elevated liver enzymes   . Family history of adverse reaction to anesthesia    sister- has problems with nausea and vomiting   . Fatigue   . Fatty liver   . Generalized headache    migraines  . GERD (gastroesophageal reflux disease)   . History of chemotherapy    topical cream for h/o skin CA  . History of kidney stones   . History of neuropathy    FH Dr. Posey Pronto neurology  . History of vertigo   . Hx of migraines   . Hyperlipidemia   . Hypothyroidism   . Insomnia   . Irritable bowel syndrome   . LLQ abdominal pain   . Melanoma ([place]) 2013  . Melanoma ([place])    x2 stg II melanoma  . Neuropathy, peripheral   . Peripheral neuropathy   . Pneumonia    hx of x 2  . PONV (postoperative nausea and vomiting)   . Pre-diabetes   . Retina disorder    left  . SCC (squamous cell carcinoma)    skin   . Stroke [facility])    ? TIA per Dr V per patient   . Thyroid disease   . Urine incontinence   . Vitamin D deficiency   . Weight gain     Patient's surgical history, family medical history, social history, medications and allergies were all reviewed in Epic   Creatinine clearance cannot be calculated (Patient's most recent lab result is older than the maximum 21 days allowed.)  Current Outpatient Medications  Medication Sig Dispense Refill  . albuterol (PROAIR HFA) 108 (90 Base) MCG/ACT inhaler Inhale 1-2 puffs into the lungs every 6 (six) hours as needed for wheezing or shortness of breath. 18 g 11  . amLODipine (NORVASC) 5 MG tablet Take 1 tablet (5 mg total) by mouth daily. In am with food 90 tablet 3  . Ascorbic Acid (VITAMIN C) 1000 MG tablet Take 1,000 mg by mouth daily.    Marland Kitchen aspirin 81 MG tablet TAKE 1 TABLET BY MOUTH EVERY DAY    . atorvastatin (LIPITOR) 10 MG tablet Take 10 mg daily by  mouth.     . Calcium 600-200 MG-UNIT tablet Take 1 tablet daily by mouth.    . Cholecalciferol (VITAMIN D3) 2000 UNITS capsule Take 2,000 Units by mouth daily.    Marland Kitchen FOLIC ACID PO Take by mouth daily.    Marland Kitchen levothyroxine (SYNTHROID) 50 MCG tablet Take 1 tablet (50 mcg total) by mouth daily before breakfast. 90 tablet 3  . Magnesium 250 MG TABS Take 250 mg by mouth daily.    Marland Kitchen  olmesartan (BENICAR) 20 MG tablet Take 0.5 tablets (10 mg total) by mouth daily. If BP>130/>80 take another 1/2 pill 45 tablet 3  . Omega-3 Fatty Acids (FISH OIL) 1000 MG CAPS Take 1,000 mg by mouth daily.     . sertraline (ZOLOFT) 25 MG tablet Take 25 mg by mouth daily.    . vitamin B-12 (CYANOCOBALAMIN) 1000 MCG tablet Take 2,000 mcg daily by mouth. 2 tabs    . Zinc 50 MG CAPS Take 50 mg by mouth daily.    Marland Kitchen omeprazole (PRILOSEC) 20 MG capsule Take 1 capsule (20 mg total) by mouth 2 (two) times daily before a meal. 60 capsule 3   No current facility-administered medications for this visit.    Physical Exam:     BP 120/60 (BP Location: Left Arm, Patient Position: Sitting, Cuff Size: Normal)   Pulse 64   Ht 5' 3.75" (1.619 m) Comment: height  Wt 186 lb (84.4 kg)   BMI 32.18 kg/m   GENERAL:  Pleasant female in NAD PSYCH: : Cooperative, normal affect CARDIAC:  RRR PULM: Normal respiratory effort, lungs CTA bilaterally, no wheezing ABDOMEN:  Nondistended, soft, nontender. No obvious masses, no hepatomegaly,  normal bowel sounds.  RECTAL: Internal and external hemorrhoids. Light brown unformed stool in vault SKIN:  turgor, no lesions seen Musculoskeletal:  Normal muscle tone, normal strength NEURO: Alert and oriented x 3, no focal neurologic deficits   Tye Savoy , NP 12/27/2019, 1:49 PM

## 2019-12-27 NOTE — Patient Instructions (Addendum)
If you are age 79 or older, your body mass index should be between 23-30. Your Body mass index is 32.18 kg/m. If this is out of the aforementioned range listed, please consider follow up with your Primary Care Provider.  If you are age 34 or younger, your body mass index should be between 19-25. Your Body mass index is 32.18 kg/m. If this is out of the aformentioned range listed, please consider follow up with your Primary Care Provider.   Your provider has requested that you go to the basement level for lab work before leaving today. Press "B" on the elevator. The lab is located at the first door on the left as you exit the elevator.

## 2019-12-29 ENCOUNTER — Encounter: Payer: Self-pay | Admitting: Nurse Practitioner

## 2019-12-30 ENCOUNTER — Telehealth: Payer: Self-pay | Admitting: Internal Medicine

## 2019-12-30 DIAGNOSIS — W57XXXA Bitten or stung by nonvenomous insect and other nonvenomous arthropods, initial encounter: Secondary | ICD-10-CM

## 2019-12-30 NOTE — Telephone Encounter (Signed)
She can do a Theatre manager or telephone for this  She will need doxycycline antibiotic or Augmentin which ever not allergic too  Is she agreeable to telephone encounter charge for this?   [place]

## 2019-12-30 NOTE — Progress Notes (Addendum)
If her bowel symptoms persist and she is willing, colonoscopy or flex sig would be the recommended way to evaluate this, especially with her bleeding and if that persists. If she is unwilling to consider endoscopic evaluation due to her prior history, then would consider virtual CT colonography to ensure no mass lesions, etc, but that would not evaluate her loose stools. Will await her stool studies initially and prior colonoscopy report, Nevin Bloodgood if you can update me when these return. Thanks   Reviewed prior report. If the patient is willing, would have her prep for colonoscopy and we would be very careful and go as far as she tolerates and her colon allows. Given her symptoms especially with bleeding, endoscopic evaluation is warranted. Nevin Bloodgood let me know what she thinks and if she wants to proceed. Thanks

## 2019-12-30 NOTE — Telephone Encounter (Signed)
Pt called in and stated that she had a tiny tick on the bottom of her foot and she pulled it off and treated it with alcohol.

## 2019-12-30 NOTE — Telephone Encounter (Signed)
Please advise 

## 2020-01-01 NOTE — Telephone Encounter (Signed)
Pt returned call to inquire about antibiotics

## 2020-01-01 NOTE — Telephone Encounter (Signed)
Spoke with the patient. States the tick was on the top bony part of her foot. She says the place is not irritated and she is feeling okay. There is a mark left from pulling it off.   She is agreeable to consult fee and would like Doxy sent to [facility] [address].

## 2020-01-02 MED ORDER — DOXYCYCLINE HYCLATE 100 MG PO TABS: 100.0000 mg | ORAL_TABLET | Freq: Two times a day (BID) | ORAL | 0 refills | Status: AC

## 2020-01-02 NOTE — Telephone Encounter (Signed)
Pt called and stated that she was at the pharmacy and the medication was not there yet,she said she has been waiting four day for it.

## 2020-01-02 NOTE — Telephone Encounter (Addendum)
Medication sent in per verbal order. Routing to you for encounter charges.

## 2020-01-03 ENCOUNTER — Encounter: Payer: Self-pay | Admitting: Internal Medicine

## 2020-01-03 DIAGNOSIS — C4492 Squamous cell carcinoma of skin, unspecified: Secondary | ICD-10-CM | POA: Insufficient documentation

## 2020-01-05 ENCOUNTER — Telehealth (INDEPENDENT_AMBULATORY_CARE_PROVIDER_SITE_OTHER): Payer: PPO | Admitting: Internal Medicine

## 2020-01-05 DIAGNOSIS — W57XXXA Bitten or stung by nonvenomous insect and other nonvenomous arthropods, initial encounter: Secondary | ICD-10-CM

## 2020-01-05 DIAGNOSIS — S90869A Insect bite (nonvenomous), unspecified foot, initial encounter: Secondary | ICD-10-CM

## 2020-01-05 NOTE — Telephone Encounter (Signed)
Medication sent in per verbal order. Routing to you for encounter charges.       Documentation    Richardine Service routed conversation to Thressa Sheller, CMA 3 days ago  Richardine Service 3 days ago  IJ   Pt called and stated that she was at the pharmacy and the medication was not there yet,she said she has been waiting four day for it.      Documentation    Thressa Sheller, CMA  You 4 days ago     Spoke with the patient. States the tick was on the top bony part of her foot. She says the place is not irritated and she is feeling okay. There is a mark left from pulling it off.   She is agreeable to consult fee and would like [facility] [address].       Documentation    Renaldo Fiddler L routed conversation to [employer], CMA 4 days ago  Renaldo Fiddler L 4 days ago  [place]   Pt returned call to inquire about antibiotics      Documentation   Laiza, [address]" [phone]  Renaldo Fiddler L 4 days ago   Ladell Pier, Daiva Eves, CMA 6 days ago  TM   She can do a Theatre manager or telephone for this  She will need doxycycline antibiotic or Augmentin which ever not allergic too  Is she agreeable to telephone encounter charge for this?   [employer]      Documentation   Thressa Sheller, CMA  You 6 days ago     Please advise        Documentation   Richardine Service routed conversation to Thressa Sheller, CMA 6 days ago  Richardine Service 6 days ago  IJ   Pt called in and stated that she had a tiny tick on the bottom of her foot and she pulled it off and treated it with alcohol.      Documentation     A/p Tick bite on foot  Rx Doxycycline bid x 10 days with food  Agreeable to telephone encounter fee   Dr. Gayland Curry Time spent 10 minutes

## 2020-01-06 ENCOUNTER — Other Ambulatory Visit
Admission: RE | Admit: 2020-01-06 | Discharge: 2020-01-06 | Disposition: A | Discharge status: Home / Self Care | Payer: PPO | Source: Ambulatory Visit | Attending: Pulmonary Disease | Admitting: Pulmonary Disease

## 2020-01-06 DIAGNOSIS — Z01812 Encounter for preprocedural laboratory examination: Secondary | ICD-10-CM | POA: Insufficient documentation

## 2020-01-06 DIAGNOSIS — Z20822 Contact with and (suspected) exposure to covid-19: Secondary | ICD-10-CM | POA: Diagnosis not present

## 2020-01-07 ENCOUNTER — Ambulatory Visit (HOSPITAL_COMMUNITY): Payer: PPO

## 2020-01-07 ENCOUNTER — Ambulatory Visit
Admission: RE | Admit: 2020-01-07 | Discharge: 2020-01-07 | Disposition: A | Discharge status: Home / Self Care | Payer: PPO | Source: Ambulatory Visit | Attending: Pulmonary Disease | Admitting: Pulmonary Disease

## 2020-01-07 ENCOUNTER — Other Ambulatory Visit: Payer: Self-pay

## 2020-01-07 DIAGNOSIS — J449 Chronic obstructive pulmonary disease, unspecified: Secondary | ICD-10-CM | POA: Insufficient documentation

## 2020-01-07 DIAGNOSIS — I081 Rheumatic disorders of both mitral and tricuspid valves: Secondary | ICD-10-CM | POA: Insufficient documentation

## 2020-01-07 DIAGNOSIS — R0602 Shortness of breath: Secondary | ICD-10-CM

## 2020-01-07 DIAGNOSIS — R0600 Dyspnea, unspecified: Secondary | ICD-10-CM | POA: Insufficient documentation

## 2020-01-07 DIAGNOSIS — Z8673 Personal history of transient ischemic attack (TIA), and cerebral infarction without residual deficits: Secondary | ICD-10-CM | POA: Insufficient documentation

## 2020-01-07 DIAGNOSIS — I2510 Atherosclerotic heart disease of native coronary artery without angina pectoris: Secondary | ICD-10-CM | POA: Insufficient documentation

## 2020-01-07 DIAGNOSIS — E785 Hyperlipidemia, unspecified: Secondary | ICD-10-CM | POA: Diagnosis not present

## 2020-01-07 LAB — SARS CORONAVIRUS 2 (TAT 6-24 HRS): SARS Coronavirus 2: NEGATIVE

## 2020-01-07 NOTE — Progress Notes (Signed)
*  PRELIMINARY RESULTS* Echocardiogram 2D Echocardiogram has been performed.  [name] 01/07/2020, 9:55 AM

## 2020-01-08 ENCOUNTER — Telehealth: Payer: Self-pay | Admitting: Pulmonary Disease

## 2020-01-08 NOTE — Telephone Encounter (Signed)
Lm for pt

## 2020-01-08 NOTE — Telephone Encounter (Addendum)
Called and spoke to pt, who is requesting echo and PFT results.   Dr. Patsey Berthold please advise. Thank you!

## 2020-01-08 NOTE — Telephone Encounter (Signed)
No major abnormalities on the breathing tests or the echocardiogram.  She does have some mild leakage of one of the heart valves but this has been followed up by Dr. Rockey Situ.  She also has some stiffness of the main chamber of the heart this is seen with aging.  This is not severe.

## 2020-01-09 DIAGNOSIS — L923 Foreign body granuloma of the skin and subcutaneous tissue: Secondary | ICD-10-CM | POA: Diagnosis not present

## 2020-01-09 NOTE — Telephone Encounter (Signed)
Lm x2 for pt.  

## 2020-01-09 NOTE — Telephone Encounter (Signed)
Pt is aware of results and voiced her understanding. Nothing further is needed.  

## 2020-01-14 DIAGNOSIS — G5601 Carpal tunnel syndrome, right upper limb: Secondary | ICD-10-CM | POA: Diagnosis not present

## 2020-01-14 DIAGNOSIS — G5602 Carpal tunnel syndrome, left upper limb: Secondary | ICD-10-CM | POA: Diagnosis not present

## 2020-01-14 DIAGNOSIS — G5603 Carpal tunnel syndrome, bilateral upper limbs: Secondary | ICD-10-CM | POA: Diagnosis not present

## 2020-01-17 ENCOUNTER — Other Ambulatory Visit: Payer: PPO

## 2020-01-17 DIAGNOSIS — K625 Hemorrhage of anus and rectum: Secondary | ICD-10-CM

## 2020-01-17 DIAGNOSIS — R195 Other fecal abnormalities: Secondary | ICD-10-CM | POA: Diagnosis not present

## 2020-01-20 LAB — FECAL LACTOFERRIN, QUANT
Fecal Lactoferrin: NEGATIVE
MICRO NUMBER:: 10663284
SPECIMEN QUALITY:: ADEQUATE

## 2020-01-20 LAB — CLOSTRIDIUM DIFFICILE TOXIN B, QUALITATIVE, REAL-TIME PCR: Toxigenic C. Difficile by PCR: NOT DETECTED

## 2020-01-21 ENCOUNTER — Telehealth: Payer: Self-pay

## 2020-01-21 DIAGNOSIS — Z1231 Encounter for screening mammogram for malignant neoplasm of breast: Secondary | ICD-10-CM | POA: Diagnosis not present

## 2020-01-21 LAB — HM MAMMOGRAPHY

## 2020-01-21 NOTE — Telephone Encounter (Signed)
                   Yetta Flock, MD filed at 01/15/2020 7:16 PM  Status: Addendum    If her bowel symptoms persist and she is willing, colonoscopy or flex sig would be the recommended way to evaluate this, especially with her bleeding and if that persists. If she is unwilling to consider endoscopic evaluation due to her prior history, then would consider virtual CT colonography to ensure no mass lesions, etc, but that would not evaluate her loose stools. Will await her stool studies initially and prior colonoscopy report, Nevin Bloodgood if you can update me when these return. Thanks   Reviewed prior report. If the patient is willing, would have her prep for colonoscopy and we would be very careful and go as far as she tolerates and her colon allows. Given her symptoms especially with bleeding, endoscopic evaluation is warranted. Nevin Bloodgood let me know what she thinks and if she wants to proceed. Thanks      Left a message for the patient to call and discuss.

## 2020-01-22 NOTE — Telephone Encounter (Signed)
Dr Havery Moros and Nevin Bloodgood I have given the patient your recommendations on this. She is more interested in the virtual CT. She tells me she has not had any diarrhea in about a week. She continues to have up to 4 soft, fluffy, or ribbon like stools. Her stool studies are negative.

## 2020-01-22 NOTE — Telephone Encounter (Signed)
Pt is returning Beth's phone call

## 2020-01-22 NOTE — Telephone Encounter (Signed)
Thanks [employer]. If the patient is willing, the best test would be colonoscopy. If she declines that, then would offer her a virtual colonoscopy. This will assess for mass lesions / polyps, and if it is normal that would be reassuring. However, we can't remove anything with this exam, if polyps or abnormalities noted it would lead to a colonoscopy. It would also not evaluate her diarrhea if that persists. Can you let me know which is her preferred way of being evaluated? Thanks

## 2020-01-23 ENCOUNTER — Other Ambulatory Visit: Payer: Self-pay

## 2020-01-23 DIAGNOSIS — K921 Melena: Secondary | ICD-10-CM

## 2020-01-23 NOTE — Telephone Encounter (Signed)
Patient had indicated she is unwilling to have a colonoscopy.  Called the patient again. No answer. Left her a message that she will be contacted directly by [facility] for scheduling and prep instructions. Order placed for Virtual CT.

## 2020-02-02 ENCOUNTER — Encounter: Payer: Self-pay | Admitting: Internal Medicine

## 2020-02-02 NOTE — Progress Notes (Signed)
Cardiology Office Note  Date:  02/03/2020   ID:  [name], DOB 06/28/1941, MRN [id number]  PCP:  McLean-Scocuzza, Nino Glow, MD   Chief Complaint  Patient presents with  . OTHER    12 month f/u c/o sob. Meds reviewed verbally with pt.    HPI:  [name] is a 79 y.o. Female with hx of  CAD and aortic athero on CT scan lightheadedness,  Mild carotid dz Hyperlipidemia morbid obesity Syncope S/p right TKR Melanoma Scarring in lung apices on CT Who presents for  coronary disease, edema  Last seen in clinic by myself July 2020  CT scan in July 2020 reviewed with her on today's visit  Showing  pulm scarring fibrosis in the apices CT chest 01/2019 Aortic Atherosclerosis  Diffuse coronary artery calcifications, particularly dense within the left anterior descending coronary artery  Reports having chronic symptoms of shortness of breath Echocardiogram performed January 07, 2020, results reviewed in detail Normal ejection fraction, normal right heart pressures, normal RV function IVC is not dilated Mild to moderately TR Echo in 2017: normal EF, normal RVSP  Weight unchanged in one year, still 188 pounds  Reports leg swelling stable Does not like to wear compression hose  Further discussion about her shortness of breath, this has been a chronic issue Also periodic symptoms of lightheaded,dizziness Very deconditioned, walks short distance, has to stop as she gets out of breath and has to recover  EKG personally reviewed by myself on todays visit NSr rate 67 no ST or T wave changes  Other past medical reviewed CT ABD: mild A athero There is 3 vessel coronary calcification  2017 and 2018  Surgery for TKR left scheduled in the near future On anxiety pill  Previous imaging and studies below reviewed with her in detail on today's visit Stress test 04/2016: no ischemia EF >55%  Carotid u/s <39% b/l  CT chest 12/2016 Very mild Aortic and branch vessel  atherosclerosis. Mild cardiomegaly, without pericardial effusion. LAD coronary artery atherosclerosis.   PMH:   has a past medical history of Acid reflux, Allergy, Anemia, Anxiety, Arthritis, Atypical moles, Bronchitis, Bronchospasm, CAD (coronary artery disease), Candida esophagitis ([place]), Carotid artery calcification, bilateral (04/2016), Cataract, Cervical stenosis of spine, Chronic insomnia, Complication of anesthesia, COPD (chronic obstructive pulmonary disease) ([place]), Depression, Diverticulitis, Diverticulosis, Dizziness, Elevated liver enzymes, Family history of adverse reaction to anesthesia, Fatigue, Fatty liver, Generalized headache, GERD (gastroesophageal reflux disease), History of chemotherapy, History of kidney stones, History of neuropathy, History of vertigo, migraines, Hyperlipidemia, Hypothyroidism, Insomnia, Irritable bowel syndrome, LLQ abdominal pain, Melanoma ([place]) (2013), Melanoma ([place]), Neuropathy, peripheral, Peripheral neuropathy, Pneumonia, PONV (postoperative nausea and vomiting), Pre-diabetes, Retina disorder, SCC (squamous cell carcinoma), Stroke ([place]), Thyroid disease, Urine incontinence, Vitamin D deficiency, and Weight gain.  PSH:    Past Surgical History:  Procedure Laterality Date  . BILATERAL SALPINGOOPHORECTOMY     ovarian cyst Dr. Ouida Sills and Dr. Alisia Ferrari 2008  . BREAST SURGERY     in 2014 bx   . BREAST SURGERY     augmentation with saline Dr. Hubbard Hartshorn 1995   . CHOLECYSTECTOMY N/A 01/28/2015   Procedure: LAPAROSCOPIC CHOLECYSTECTOMY WITH INTRAOPERATIVE CHOLANGIOGRAM;  Surgeon: Autumn Messing III, MD;  Location: [facility];  Service: General;  Laterality: N/A;  . CLOSED MANIPULATION SHOULDER     rt  . COLONOSCOPY WITH PROPOFOL N/A 11/20/2012   Procedure: COLONOSCOPY WITH PROPOFOL;  Surgeon: Garlan Fair, MD;  Location: [facility];  Service: Endoscopy;  Laterality: N/A;  .  colonscopy     May 2014  . JOINT REPLACEMENT     RTK  . KNEE ARTHROSCOPY    . left knee  miniscus tear repair    . MELANOMA EXCISION WITH SENTINEL LYMPH NODE BIOPSY  03/18/11   Back, nodes neg  . OOPHORECTOMY    . ROTATOR CUFF REPAIR     left  . SHOULDER SURGERY     right and left   . SKIN CANCER EXCISION     multiple  . TOTAL HIP ARTHROPLASTY    . TOTAL KNEE ARTHROPLASTY     right 2010 and left in 2018   . TOTAL KNEE ARTHROPLASTY Left 05/16/2017   Procedure: LEFT TOTAL KNEE ARTHROPLASTY;  Surgeon: Paralee Cancel, MD;  Location: [facility];  Service: Orthopedics;  Laterality: Left;  70 mins  . TUBAL LIGATION    . TUMOR REMOVAL     ovary    Current Outpatient Medications  Medication Sig Dispense Refill  . albuterol (PROAIR HFA) 108 (90 Base) MCG/ACT inhaler Inhale 1-2 puffs into the lungs every 6 (six) hours as needed for wheezing or shortness of breath. 18 g 11  . amLODipine (NORVASC) 5 MG tablet Take 1 tablet (5 mg total) by mouth daily. In am with food 90 tablet 3  . Ascorbic Acid (VITAMIN C) 1000 MG tablet Take 1,000 mg by mouth daily.    Marland Kitchen aspirin 81 MG tablet TAKE 1 TABLET BY MOUTH EVERY DAY    . atorvastatin (LIPITOR) 10 MG tablet Take 10 mg daily by mouth.     . Calcium 600-200 MG-UNIT tablet Take 1 tablet daily by mouth.    . Cholecalciferol (VITAMIN D3) 2000 UNITS capsule Take 2,000 Units by mouth daily.    Marland Kitchen FOLIC ACID PO Take by mouth daily.    Marland Kitchen levothyroxine (SYNTHROID) 50 MCG tablet Take 1 tablet (50 mcg total) by mouth daily before breakfast. 90 tablet 3  . Magnesium 250 MG TABS Take 250 mg by mouth daily.    Marland Kitchen olmesartan (BENICAR) 20 MG tablet Take 0.5 tablets (10 mg total) by mouth daily. If BP>130/>80 take another 1/2 pill 45 tablet 3  . Omega-3 Fatty Acids (FISH OIL) 1000 MG CAPS Take 1,000 mg by mouth daily.     Marland Kitchen omeprazole (PRILOSEC) 20 MG capsule Take 1 capsule (20 mg total) by mouth 2 (two) times daily before a meal. (Patient taking differently: Take 20 mg by mouth daily. ) 60 capsule 3  . sertraline (ZOLOFT) 25 MG tablet Take 25 mg by mouth daily.     . vitamin B-12 (CYANOCOBALAMIN) 1000 MCG tablet Take 2,000 mcg daily by mouth. 2 tabs    . Zinc 50 MG CAPS Take 50 mg by mouth daily.     No current facility-administered medications for this visit.     Allergies:   Epinephrine, Penicillins, Lidocaine hcl, Other, and Valium [diazepam]   Social History:  The patient  reports that she has never smoked. She has never used smokeless tobacco. She reports current alcohol use of about 1.0 standard drink of alcohol per week. She reports that she does not use drugs.   Family History:   family history includes Alcohol abuse in her son; Alzheimer's disease in her paternal aunt, paternal aunt, paternal aunt, and paternal aunt; Arthritis in her sister and sister; COPD in her sister; Cancer in her maternal grandfather, sister, and another family member; Colon cancer in her maternal grandfather; Colon polyps in her sister; Depression in her son; Diabetes  in her father, sister, and son; Early death in her father, maternal grandfather, and paternal grandmother; Heart disease in her daughter, father, sister, sister, and son; Hyperlipidemia in her daughter, father, mother, sister, sister, and son; Hypertension in her daughter, father, mother, sister, and sister; Pancreatic cancer in her maternal uncle; Stroke in her maternal grandmother, mother, and paternal grandmother.    Review of Systems: Review of Systems  Constitutional: Negative.   HENT: Negative.   Respiratory: Positive for shortness of breath.   Cardiovascular: Negative.   Gastrointestinal: Negative.   Musculoskeletal: Negative.   Neurological: Negative.   Psychiatric/Behavioral: Negative.   All other systems reviewed and are negative.   PHYSICAL EXAM: VS:  BP 128/60 (BP Location: Left Arm, Patient Position: Sitting, Cuff Size: Normal)   Pulse 67   Ht 5\' 6"  (1.676 m)   Wt 188 lb (85.3 kg)   SpO2 96%   BMI 30.34 kg/m  , BMI Body mass index is 30.34 kg/m. Constitutional:  oriented to  person, place, and time. No distress.  Obese HENT:  Head: Grossly normal Eyes:  no discharge. No scleral icterus.  Neck: No JVD, no carotid bruits  Cardiovascular: Regular rate and rhythm, no murmurs appreciated Pulmonary/Chest: Clear to auscultation bilaterally, no wheezes or rails Abdominal: Soft.  no distension.  no tenderness.  Musculoskeletal: Normal range of motion Neurological:  normal muscle tone. Coordination normal. No atrophy Skin: Skin warm and dry Psychiatric: normal affect, pleasant   Recent Labs: 09/09/2019: ALT 15; BUN 25; Creatinine, Ser 0.93; Hemoglobin 14.1; Platelets 201.0; Potassium 3.9; Sodium 140; TSH 3.44    Lipid Panel Lab Results  Component Value Date   CHOL 162 09/09/2019   HDL 83.50 09/09/2019   LDLCALC 60 09/09/2019   TRIG 91.0 09/09/2019      Wt Readings from Last 3 Encounters:  02/03/20 188 lb (85.3 kg)  12/27/19 186 lb (84.4 kg)  12/12/19 183 lb (83 kg)       ASSESSMENT AND PLAN:  Aortic atherosclerosis ([place]) Recommend she increase Lipitor up to 20 mg daily, goal total cholesterol less than 150  Coronary artery calcification seen on CAT scan Scan in 2017 and 2018 reviewed with her She is having shortness of breath, unable to exclude ischemia Stress test has been ordered, details discussed with her Goal LDL <70   Mixed hyperlipidemia Lipitor increased up to 20 daily Goal LDL <70  Chest tightness Long discussion concerning symptoms No regular exercise program, likely component of deconditioning Stress test ordered as above  SOB: Likely from deconditioning, high weight, small amount of fibrosis lung apices seen on CT scan Stress test ordered to rule out ischemia If no significant ischemia would recommend lung works program to the hospital This was discussed with her in detail    Total encounter time more than 25 minutes  Greater than 50% was spent in counseling and coordination of care with the patient     No orders of  the defined types were placed in this encounter.    Signed, Esmond Plants, M.D., Ph.D. 02/03/2020  [place], [place]

## 2020-02-03 ENCOUNTER — Encounter: Payer: Self-pay | Admitting: Cardiovascular Disease

## 2020-02-03 ENCOUNTER — Other Ambulatory Visit: Payer: Self-pay

## 2020-02-03 ENCOUNTER — Ambulatory Visit: Payer: PPO | Admitting: Cardiovascular Disease

## 2020-02-03 VITALS — BP 128/60 | HR 67 | Ht 66.0 in | Wt 188.0 lb

## 2020-02-03 DIAGNOSIS — I2510 Atherosclerotic heart disease of native coronary artery without angina pectoris: Secondary | ICD-10-CM | POA: Diagnosis not present

## 2020-02-03 DIAGNOSIS — I739 Peripheral vascular disease, unspecified: Secondary | ICD-10-CM | POA: Diagnosis not present

## 2020-02-03 DIAGNOSIS — I700 Atherosclerosis of aorta: Secondary | ICD-10-CM | POA: Diagnosis not present

## 2020-02-03 DIAGNOSIS — E782 Mixed hyperlipidemia: Secondary | ICD-10-CM | POA: Diagnosis not present

## 2020-02-03 DIAGNOSIS — R0602 Shortness of breath: Secondary | ICD-10-CM

## 2020-02-03 DIAGNOSIS — R55 Syncope and collapse: Secondary | ICD-10-CM

## 2020-02-03 DIAGNOSIS — R079 Chest pain, unspecified: Secondary | ICD-10-CM

## 2020-02-03 DIAGNOSIS — R0789 Other chest pain: Secondary | ICD-10-CM | POA: Diagnosis not present

## 2020-02-03 MED ORDER — ATORVASTATIN CALCIUM 20 MG PO TABS: 20.0000 mg | ORAL_TABLET | Freq: Every day | ORAL | 3 refills | Status: DC

## 2020-02-03 NOTE — Patient Instructions (Addendum)
We may need to look into the Lung Works Program after you meet with pulmonary doctor   Medication Instructions:  Please increase the atorvastatin up to 20 mg daily  If you need a refill on your cardiac medications before your next appointment, please call your pharmacy.    Lab work: No new labs needed   If you have labs (blood work) drawn today and your tests are completely normal, you will receive your results only by: Marland Kitchen MyChart Message (if you have MyChart) OR . A paper copy in the mail If you have any lab test that is abnormal or we need to change your treatment, we will call you to review the results.   Testing/Procedures: [facility] Call for shortness of breath, chest tightness  [facility] MYOVIEW  Your caregiver has ordered a Stress Test with nuclear imaging. The purpose of this test is to evaluate the blood supply to your heart muscle. This procedure is referred to as a "Non-Invasive Stress Test." This is because other than having an IV started in your vein, nothing is inserted or "invades" your body. Cardiac stress tests are done to find areas of poor blood flow to the heart by determining the extent of coronary artery disease (CAD). Some patients exercise on a treadmill, which naturally increases the blood flow to your heart, while others who are  unable to walk on a treadmill due to physical limitations have a pharmacologic/chemical stress agent called Lexiscan . This medicine will mimic walking on a treadmill by temporarily increasing your coronary blood flow.   Please note: these test may take anywhere between 2-4 hours to complete  PLEASE REPORT TO [facility] AT THE FIRST DESK WILL DIRECT YOU WHERE TO GO  Date of Procedure:_____________________________________  Arrival Time for Procedure:______________________________   PLEASE NOTIFY THE OFFICE AT LEAST 24 HOURS IN ADVANCE IF YOU ARE UNABLE TO KEEP YOUR APPOINTMENT.  [phone] AND   PLEASE NOTIFY NUCLEAR MEDICINE AT [facility] AT LEAST 24 HOURS IN ADVANCE IF YOU ARE UNABLE TO KEEP YOUR APPOINTMENT. [phone]  How to prepare for your Myoview test:  1. Do not eat or drink after midnight 2. No caffeine for 24 hours prior to test 3. No smoking 24 hours prior to test. 4. Your medication may be taken with water.  If your doctor stopped a medication because of this test, do not take that medication. 5. Ladies, please do not wear dresses.  Skirts or pants are appropriate. Please wear a short sleeve shirt. 6. No perfume, cologne or lotion. 7. Wear comfortable walking shoes. No heels!   Follow-Up: At [facility], you and your health needs are our priority.  As part of our continuing mission to provide you with exceptional heart care, we have created designated Provider Care Teams.  These Care Teams include your primary Cardiologist (physician) and Advanced Practice Providers (APPs -  Physician Assistants and Nurse Practitioners) who all work together to provide you with the care you need, when you need it.  . You will need a follow up appointment in 12 months  . Providers on your designated Care Team:   . Murray Hodgkins, NP . Christell Faith, PA-C . Marrianne Mood, PA-C  Any Other Special Instructions Will Be Listed Below (If Applicable).

## 2020-02-04 ENCOUNTER — Telehealth: Payer: Self-pay | Admitting: Cardiovascular Disease

## 2020-02-04 NOTE — Telephone Encounter (Signed)
Left voicemail message to call back  

## 2020-02-04 NOTE — Telephone Encounter (Signed)
Patient wants to know if Dr. Rockey Situ can expedite her appt to pulmonology.  Scheduled in September and was told to request asap status

## 2020-02-04 NOTE — Telephone Encounter (Signed)
Patient is returning your call.  

## 2020-02-06 ENCOUNTER — Telehealth: Payer: Self-pay | Admitting: Internal Medicine

## 2020-02-06 NOTE — Telephone Encounter (Signed)
Spoke with patient and she wanted to know if we could assist with getting her pulmonary appt sooner.  sooner. Advised that right now they are super busy and booked out for some time. Let her know that we would not be possible to do that but that I could reach out to them to see if they have something to sooner for her. She was appreciative for the call back and let her know that hopefully they will be able to fit her in sooner. She verbalized understanding with no further questions.

## 2020-02-06 NOTE — Telephone Encounter (Signed)
Pt would like a call back about tests that are scheduled that involve injection dye. She states that she is worried about it interacting badly with kidneys. Please advise.

## 2020-02-06 NOTE — Telephone Encounter (Signed)
Please advise 

## 2020-02-06 NOTE — Telephone Encounter (Signed)
Pt stated that Dr. Rockey Situ ordered a stress test and wanted her to follow up with Dr. Patsey Berthold a few days after test.  Stress test is scheduled for 02/20/2020. Pt is aware that we do not currently have sooner availability then 04/02/2020. Will leave message in triage to ensure follow up after stress test. We will double book if Dr. Patsey Berthold feels that pt should be seen sooner after stress test is reviewed.  Pt voiced her understanding and had no further questions.

## 2020-02-06 NOTE — Telephone Encounter (Signed)
Patient is calling back for pam to check on her referral   Patient made aware Pam is out until next Wednesday Please advise when able

## 2020-02-06 NOTE — Telephone Encounter (Signed)
Heart doctor ordered this test have her call them and discuss concerns please

## 2020-02-07 NOTE — Telephone Encounter (Signed)
Patient informed and verbalized understanding.  Was concerned about her BUN being elevated. She will reach out to imaging and cardiology.

## 2020-02-14 DIAGNOSIS — L905 Scar conditions and fibrosis of skin: Secondary | ICD-10-CM | POA: Diagnosis not present

## 2020-02-14 DIAGNOSIS — D485 Neoplasm of uncertain behavior of skin: Secondary | ICD-10-CM | POA: Diagnosis not present

## 2020-02-20 ENCOUNTER — Ambulatory Visit
Admission: RE | Admit: 2020-02-20 | Discharge: 2020-02-20 | Disposition: A | Discharge status: Home / Self Care | Payer: PPO | Source: Ambulatory Visit | Attending: Cardiovascular Disease | Admitting: Cardiovascular Disease

## 2020-02-20 ENCOUNTER — Other Ambulatory Visit: Payer: Self-pay

## 2020-02-20 DIAGNOSIS — R0602 Shortness of breath: Secondary | ICD-10-CM

## 2020-02-20 DIAGNOSIS — R079 Chest pain, unspecified: Secondary | ICD-10-CM | POA: Diagnosis not present

## 2020-02-20 LAB — NM MYOCAR MULTI W/SPECT W/WALL MOTION / EF
Estimated workload: 1 METS
Exercise duration (min): 0 min
Exercise duration (sec): 0 s
LV dias vol: 61 mL (ref 46–106)
LV sys vol: 14 mL
MPHR: 141 {beats}/min
Peak HR: 68 {beats}/min
Percent HR: 48 %
Rest HR: 50 {beats}/min
SDS: 0
SRS: 0
SSS: 0
TID: 1.1

## 2020-02-20 MED ORDER — REGADENOSON 0.4 MG/5ML IV SOLN
0.4000 mg | Freq: Once | INTRAVENOUS | Status: AC
  Administered 2020-02-20: 0.4 mg via INTRAVENOUS
  Filled 2020-02-20: qty 5

## 2020-02-20 MED ORDER — TECHNETIUM TC 99M TETROFOSMIN IV KIT
31.0600 | PACK | Freq: Once | INTRAVENOUS | Status: AC | PRN
  Administered 2020-02-20: 31.06 via INTRAVENOUS

## 2020-02-20 MED ORDER — TECHNETIUM TC 99M TETROFOSMIN IV KIT
10.0000 | PACK | Freq: Once | INTRAVENOUS | Status: AC | PRN
  Administered 2020-02-20: 10.14 via INTRAVENOUS

## 2020-02-21 ENCOUNTER — Telehealth: Payer: Self-pay

## 2020-02-21 NOTE — Telephone Encounter (Signed)
Call to patient to review stress test results.    Pt verbalized understanding and has no further questions at this time.    Advised pt to call for any further questions or concerns.  No further orders.   

## 2020-02-21 NOTE — Telephone Encounter (Signed)
-----   Message from Minna Merritts, MD sent at 02/20/2020  2:36 PM EDT ----- Stress test, No ischemia, normal ejection fraction Low risk study

## 2020-02-24 NOTE — Telephone Encounter (Signed)
Pt is aware of below message and voiced her understanding. Nothing further is needed. 

## 2020-02-24 NOTE — Telephone Encounter (Signed)
Lm for patient.  

## 2020-02-24 NOTE — Telephone Encounter (Signed)
9/16 should be OK

## 2020-02-24 NOTE — Telephone Encounter (Signed)
Dr. Patsey Berthold, can you review stress test and let mek now if patient should be seen sooner then 04/02/2020?  Thanks

## 2020-02-25 ENCOUNTER — Ambulatory Visit
Admission: RE | Admit: 2020-02-25 | Discharge: 2020-02-25 | Disposition: A | Discharge status: Home / Self Care | Payer: PPO | Source: Ambulatory Visit | Attending: Gastroenterology | Admitting: Gastroenterology

## 2020-02-25 DIAGNOSIS — I700 Atherosclerosis of aorta: Secondary | ICD-10-CM | POA: Diagnosis not present

## 2020-02-25 DIAGNOSIS — K6389 Other specified diseases of intestine: Secondary | ICD-10-CM | POA: Diagnosis not present

## 2020-02-25 DIAGNOSIS — K449 Diaphragmatic hernia without obstruction or gangrene: Secondary | ICD-10-CM | POA: Diagnosis not present

## 2020-02-25 DIAGNOSIS — J358 Other chronic diseases of tonsils and adenoids: Secondary | ICD-10-CM | POA: Diagnosis not present

## 2020-02-25 DIAGNOSIS — K5730 Diverticulosis of large intestine without perforation or abscess without bleeding: Secondary | ICD-10-CM | POA: Diagnosis not present

## 2020-02-25 DIAGNOSIS — K921 Melena: Secondary | ICD-10-CM

## 2020-02-26 ENCOUNTER — Other Ambulatory Visit: Payer: Self-pay

## 2020-02-28 ENCOUNTER — Other Ambulatory Visit: Payer: Self-pay

## 2020-02-28 ENCOUNTER — Encounter: Payer: Self-pay | Admitting: Internal Medicine

## 2020-02-28 ENCOUNTER — Ambulatory Visit (INDEPENDENT_AMBULATORY_CARE_PROVIDER_SITE_OTHER): Payer: PPO | Admitting: Internal Medicine

## 2020-02-28 VITALS — BP 122/68 | HR 66 | Temp 98.1°F | Ht 66.0 in | Wt 187.4 lb

## 2020-02-28 DIAGNOSIS — E669 Obesity, unspecified: Secondary | ICD-10-CM | POA: Diagnosis not present

## 2020-02-28 DIAGNOSIS — R7303 Prediabetes: Secondary | ICD-10-CM

## 2020-02-28 DIAGNOSIS — D239 Other benign neoplasm of skin, unspecified: Secondary | ICD-10-CM | POA: Diagnosis not present

## 2020-02-28 DIAGNOSIS — Z1389 Encounter for screening for other disorder: Secondary | ICD-10-CM

## 2020-02-28 DIAGNOSIS — E039 Hypothyroidism, unspecified: Secondary | ICD-10-CM | POA: Diagnosis not present

## 2020-02-28 DIAGNOSIS — K760 Fatty (change of) liver, not elsewhere classified: Secondary | ICD-10-CM | POA: Diagnosis not present

## 2020-02-28 DIAGNOSIS — K5790 Diverticulosis of intestine, part unspecified, without perforation or abscess without bleeding: Secondary | ICD-10-CM

## 2020-02-28 DIAGNOSIS — I10 Essential (primary) hypertension: Secondary | ICD-10-CM | POA: Diagnosis not present

## 2020-02-28 NOTE — Patient Instructions (Signed)
Nonalcoholic Fatty Liver Disease Diet, Adult Nonalcoholic fatty liver disease is a condition that causes fat to build up in and around the liver. The disease makes it harder for the liver to work the way that it should. Following a healthy diet can help to keep nonalcoholic fatty liver disease under control. It can also help to prevent or improve conditions that are associated with the disease, such as heart disease, diabetes, high blood pressure, and abnormal cholesterol levels. Along with regular exercise, this diet:  Promotes weight loss.  Helps to control blood sugar levels.  Helps to improve the way that the body uses insulin. What are tips for following this plan? Reading food labels Always check food labels for:  The amount of saturated fat in a food. You should limit your intake of saturated fat. Saturated fat is found in foods that come from animals, including meat and dairy products such as butter, cheese, and whole milk.  The amount of fiber in a food. You should choose high-fiber foods such as fruits, vegetables, and whole grains. Try to get 25-30 grams (g) of fiber a day.  Cooking  When cooking, use heart-healthy oils that are high in monounsaturated fats. These include olive oil, canola oil, and avocado oil.  Limit frying or deep-frying foods. Cook foods using healthy methods such as baking, boiling, steaming, and grilling instead. Meal planning  You may want to keep track of how many calories you take in. Eating the right amount of calories will help you achieve a healthy weight. Meeting with a registered dietitian can help you get started.  Limit how often you eat takeout and fast food. These foods are usually very high in fat, salt, and sugar.  Use the glycemic index (GI) to plan your meals. The index tells you how quickly a food will raise your blood sugar. Choose low-GI foods (GI less than 55). These foods take a longer time to raise blood sugar. A registered dietitian  can help you identify foods lower on the GI scale. Lifestyle  You may want to follow a Mediterranean diet. This diet includes a lot of vegetables, lean meats or fish, whole grains, fruits, and healthy oils and fats. What foods can I eat?  Fruits Bananas. Apples. Oranges. Grapes. Papaya. Mango. Pomegranate. Kiwi. Grapefruit. Cherries. Vegetables Lettuce. Spinach. Peas. Beets. Cauliflower. Cabbage. Broccoli. Carrots. Tomatoes. Squash. Eggplant. Herbs. Peppers. Onions. Cucumbers. Brussels sprouts. Yams and sweet potatoes. Beans. Lentils. Grains Whole wheat or whole-grain foods, including breads, crackers, cereals, and pasta. Stone-ground whole wheat. Unsweetened oatmeal. Bulgur. Barley. Quinoa. Brown or wild rice. Corn or whole wheat flour tortillas. Meats and other proteins Lean meats. Poultry. Tofu. Seafood and shellfish. Dairy Low-fat or fat-free dairy products, such as yogurt, cottage cheese, or cheese. Beverages Water. Sugar-free drinks. Tea. Coffee. Low-fat or skim milk. Milk alternatives, such as soy or almond milk. Real fruit juice. Fats and oils Avocado. Canola or olive oil. Nuts and nut butters. Seeds. Seasonings and condiments Mustard. Relish. Low-fat, low-sugar ketchup and barbecue sauce. Low-fat or fat-free mayonnaise. Sweets and desserts Sugar-free sweets. The items listed above may not be a complete list of foods and beverages you can eat. Contact a dietitian for more information. What foods should I limit or avoid? Meats and other proteins Limit red meat to 1-2 times a week. Dairy [employer]. Fats and oils Palm oil and coconut oil. Fried foods. Other foods Processed foods. Foods that contain a lot of salt or sodium. Sweets and desserts Sweets that contain  sugar. Beverages Sweetened drinks, such as sweet tea, milkshakes, iced sweet drinks, and sodas. Alcohol. The items listed above may not be a complete list of foods and beverages you should avoid. Contact a  dietitian for more information. Where to find more information The [employer] of Diabetes and Digestive and Kidney Diseases: [email or web address] Summary  Nonalcoholic fatty liver disease is a condition that causes fat to build up in and around the liver.  Following a healthy diet can help to keep nonalcoholic fatty liver disease under control. Your diet should be rich in fruits, vegetables, whole grains, and lean proteins.  Limit your intake of saturated fat. Saturated fat is found in foods that come from animals, including meat and dairy products such as butter, cheese, and whole milk.  This diet promotes weight loss, helps to control blood sugar levels, and helps to improve the way that the body uses insulin. This information is not intended to replace advice given to you by your health care provider. Make sure you discuss any questions you have with your health care provider. Document Revised: 10/26/2018 Document Reviewed: 07/26/2018 Elsevier Patient Education  [place].  Fatty Liver Disease  Fatty liver disease occurs when too much fat has built up in your liver cells. Fatty liver disease is also called hepatic steatosis or steatohepatitis. The liver removes harmful substances from your bloodstream and produces fluids that your body needs. It also helps your body use and store energy from the food you eat. In many cases, fatty liver disease does not cause symptoms or problems. It is often diagnosed when tests are being done for other reasons. However, over time, fatty liver can cause inflammation that may lead to more serious liver problems, such as scarring of the liver (cirrhosis) and liver failure. Fatty liver is associated with insulin resistance, increased body fat, high blood pressure (hypertension), and high cholesterol. These are features of metabolic syndrome and increase your risk for stroke, diabetes, and heart disease. What are the causes? This condition may be  caused by:  Drinking too much alcohol.  Poor nutrition.  Obesity.  Cushing's syndrome.  Diabetes.  High cholesterol.  Certain drugs.  Poisons.  Some viral infections.  Pregnancy. What increases the risk? You are more likely to develop this condition if you:  Abuse alcohol.  Are overweight.  Have diabetes.  Have hepatitis.  Have a high triglyceride level.  Are pregnant. What are the signs or symptoms? Fatty liver disease often does not cause symptoms. If symptoms do develop, they can include:  Fatigue.  Weakness.  Weight loss.  Confusion.  Abdominal pain.  Nausea and vomiting.  Yellowing of your skin and the white parts of your eyes (jaundice).  Itchy skin. How is this diagnosed? This condition may be diagnosed by:  A physical exam and medical history.  Blood tests.  Imaging tests, such as an ultrasound, CT scan, or MRI.  A liver biopsy. A small sample of liver tissue is removed using a needle. The sample is then looked at under a microscope. How is this treated? Fatty liver disease is often caused by other health conditions. Treatment for fatty liver may involve medicines and lifestyle changes to manage conditions such as:  Alcoholism.  High cholesterol.  Diabetes.  Being overweight or obese. Follow these instructions at home:   Do not drink alcohol. If you have trouble quitting, ask your health care provider how to safely quit with the help of medicine or a supervised program. This is important  to keep your condition from getting worse.  Eat a healthy diet as told by your health care provider. Ask your health care provider about working with a diet and nutrition specialist (dietitian) to develop an eating plan.  Exercise regularly. This can help you lose weight and control your cholesterol and diabetes. Talk to your health care provider about an exercise plan and which activities are best for you.  Take over-the-counter and  prescription medicines only as told by your health care provider.  Keep all follow-up visits as told by your health care provider. This is important. Contact a health care provider if: You have trouble controlling your:  Blood sugar. This is especially important if you have diabetes.  Cholesterol.  Drinking of alcohol. Get help right away if:  You have abdominal pain.  You have jaundice.  You have nausea and vomiting.  You vomit blood or material that looks like coffee grounds.  You have stools that are black, tar-like, or bloody. Summary  Fatty liver disease develops when too much fat builds up in the cells of your liver.  Fatty liver disease often causes no symptoms or problems. However, over time, fatty liver can cause inflammation that may lead to more serious liver problems, such as scarring of the liver (cirrhosis).  You are more likely to develop this condition if you abuse alcohol, are pregnant, are overweight, have diabetes, have hepatitis, or have high triglyceride levels.  Contact your health care provider if you have trouble controlling your weight, blood sugar, cholesterol, or drinking of alcohol. This information is not intended to replace advice given to you by your health care provider. Make sure you discuss any questions you have with your health care provider. Document Revised: 06/16/2017 Document Reviewed: 04/12/2017 Elsevier Patient Education  2020 [employer].

## 2020-02-28 NOTE — Progress Notes (Signed)
Chief Complaint  Patient presents with  . Follow-up   F/u  1. S/p excision 02/14/20 severely dysplastic nevus with Dr. Winifred Olive wound is healing but puckering on the edges right >left no signs of infection  2. Diarrhea and GIB with h/o diverticulosis w/u with Dr. Havery Moros CT virtual colonoscopy 02/25/20 with unable to visualize sigmoid colon and rec barium vs sigmoidoscopy pt agreeable to sigmoidoscopy but wants to disc with Dr. Havery Moros with h/o tortuous colon if this is better or barium study 3. Stress test 02/20/20 low risk cardiac stress test no CP today  4. HTN norvasc 5 mg qd  5. Hypothyroidism on levo 50 mcg check fasting labs in 04/2020   Review of Systems  Constitutional: Negative for weight loss.  HENT: Negative for hearing loss.   Eyes: Negative for blurred vision.  Respiratory: Negative for shortness of breath.   Cardiovascular: Negative for chest pain.  Gastrointestinal: Negative for abdominal pain.  Musculoskeletal: Negative for falls.  Skin: Negative for rash.  Neurological: Negative for headaches.   Past Medical History:  Diagnosis Date  . Acid reflux   . Allergy   . Anemia   . Anxiety    in past  . Arthritis    all over  . Atypical moles   . Bronchitis   . Bronchospasm    reactive with perfume/cologne and smoke  . CAD (coronary artery disease)    patient denies on preop of 10/24   . Candida esophagitis ([place])   . Carotid artery calcification, bilateral 04/2016  . Cataract   . Cervical stenosis of spine   . Chronic insomnia   . Complication of anesthesia    shoulder surgery- not all the way asleep  . COPD (chronic obstructive pulmonary disease) (HCC)    chronic bronchitis   . Depression    in past  . Diverticulitis   . Diverticulosis   . Dizziness   . Elevated liver enzymes   . Family history of adverse reaction to anesthesia    sister- has problems with nausea and vomiting   . Fatigue   . Fatty liver   . Generalized headache    migraines  .  GERD (gastroesophageal reflux disease)   . History of chemotherapy    topical cream for h/o skin CA  . History of kidney stones   . History of neuropathy    FH Dr. Posey Pronto neurology  . History of vertigo   . Hx of migraines   . Hyperlipidemia   . Hypothyroidism   . Insomnia   . Irritable bowel syndrome   . LLQ abdominal pain   . Melanoma ([place]) 2013  . Melanoma ([place])    x2 stg II melanoma  . Neuropathy, peripheral   . Peripheral neuropathy   . Pneumonia    hx of x 2  . PONV (postoperative nausea and vomiting)   . Pre-diabetes   . Retina disorder    left  . SCC (squamous cell carcinoma)    skin   . Stroke [facility])    ? TIA per Dr V per patient   . Thyroid disease   . Urine incontinence   . Vitamin D deficiency   . Weight gain    Past Surgical History:  Procedure Laterality Date  . BILATERAL SALPINGOOPHORECTOMY     ovarian cyst Dr. Ouida Sills and Dr. Alisia Ferrari 2008  . BREAST SURGERY     in 2014 bx   . BREAST SURGERY     augmentation with saline Dr. Joneen Caraway  [place]   . CHOLECYSTECTOMY N/A 01/28/2015   Procedure: LAPAROSCOPIC CHOLECYSTECTOMY WITH INTRAOPERATIVE CHOLANGIOGRAM;  Surgeon: Autumn Messing III, MD;  Location: [facility];  Service: General;  Laterality: N/A;  . CLOSED MANIPULATION SHOULDER     rt  . COLONOSCOPY WITH PROPOFOL N/A 11/20/2012   Procedure: COLONOSCOPY WITH PROPOFOL;  Surgeon: Garlan Fair, MD;  Location: [facility];  Service: Endoscopy;  Laterality: N/A;  . colonscopy     May 2014  . JOINT REPLACEMENT     RTK  . KNEE ARTHROSCOPY    . left knee miniscus tear repair    . MELANOMA EXCISION WITH SENTINEL LYMPH NODE BIOPSY  03/18/11   Back, nodes neg  . OOPHORECTOMY    . ROTATOR CUFF REPAIR     left  . SHOULDER SURGERY     right and left   . SKIN CANCER EXCISION     multiple  . TOTAL HIP ARTHROPLASTY    . TOTAL KNEE ARTHROPLASTY     right 2010 and left in 2018   . TOTAL KNEE ARTHROPLASTY Left 05/16/2017   Procedure: LEFT TOTAL KNEE ARTHROPLASTY;   Surgeon: Paralee Cancel, MD;  Location: [facility];  Service: Orthopedics;  Laterality: Left;  70 mins  . TUBAL LIGATION    . TUMOR REMOVAL     ovary   Family History  Problem Relation Age of Onset  . Hypertension Mother   . Stroke Mother   . Hyperlipidemia Mother   . Heart disease Father   . Diabetes Father   . Hypertension Father   . Hyperlipidemia Father   . Early death Father   . Diabetes Sister   . Heart disease Sister        heart murmur  . Arthritis Sister   . COPD Sister   . Hyperlipidemia Sister   . Hypertension Sister   . Pancreatic cancer Maternal Uncle   . Alzheimer's disease Paternal Aunt   . Colon cancer Maternal Grandfather   . Cancer Maternal Grandfather   . Early death Maternal Grandfather   . Alzheimer's disease Paternal Aunt   . Alzheimer's disease Paternal Aunt   . Colon polyps Sister        adenomous  . Arthritis Sister   . Heart disease Sister   . Hyperlipidemia Sister   . Hypertension Sister   . Cancer Sister   . Heart disease Daughter   . Hyperlipidemia Daughter   . Hypertension Daughter   . Heart disease Son   . Hyperlipidemia Son   . Alcohol abuse Son   . Depression Son   . Diabetes Son   . Stroke Maternal Grandmother   . Early death Paternal Grandmother   . Stroke Paternal Grandmother   . Cancer Other        2 paternal uncles and 1 maternal uncle colon cancer  . Alzheimer's disease Paternal Aunt    Social History   Socioeconomic History  . Marital status: Divorced    Spouse name: Not on file  . Number of children: 3  . Years of education: College  . Highest education level: Some college, no degree  Occupational History  . Occupation: Retired    Fish farm manager: RETIRED  Tobacco Use  . Smoking status: Never Smoker  . Smokeless tobacco: Never Used  Vaping Use  . Vaping Use: Never used  Substance and Sexual Activity  . Alcohol use: Yes    Alcohol/week: 1.0 standard drink    Types: 1 Cans of beer per week  Comment: once a week beer  or wine  . Drug use: No  . Sexual activity: Not Currently  Other Topics Concern  . Not on file  Social History Narrative   Patient lives at home alone in a one story home.   Has 3 children (2 daughters and 1 son)    Retired from [employer]   Divorced since 1992    Caffeine Use: 1 cup daily   No guns    Wears seat belt    Lives with cat   Social Determinants of Health   Financial Resource Strain: Low Risk   . Difficulty of Paying Living Expenses: Not hard at all  Food Insecurity: No Food Insecurity  . Worried About Charity fundraiser in the Last Year: Never true  . Ran Out of Food in the Last Year: Never true  Transportation Needs: No Transportation Needs  . Lack of Transportation (Medical): No  . Lack of Transportation (Non-Medical): No  Physical Activity:   . Days of Exercise per Week:   . Minutes of Exercise per Session:   Stress: No Stress Concern Present  . Feeling of Stress : Only a little  Social Connections:   . Frequency of Communication with Friends and Family:   . Frequency of Social Gatherings with Friends and Family:   . Attends Religious Services:   . Active Member of Clubs or Organizations:   . Attends Archivist Meetings:   Marland Kitchen Marital Status:   Intimate Partner Violence: Not At Risk  . Fear of Current or Ex-Partner: No  . Emotionally Abused: No  . Physically Abused: No  . Sexually Abused: No   Current Meds  Medication Sig  . albuterol (PROAIR HFA) 108 (90 Base) MCG/ACT inhaler Inhale 1-2 puffs into the lungs every 6 (six) hours as needed for wheezing or shortness of breath.  Marland Kitchen amLODipine (NORVASC) 5 MG tablet Take 1 tablet (5 mg total) by mouth daily. In am with food  . Ascorbic Acid (VITAMIN C) 1000 MG tablet Take 1,000 mg by mouth daily.  Marland Kitchen aspirin 81 MG tablet TAKE 1 TABLET BY MOUTH EVERY DAY  . atorvastatin (LIPITOR) 20 MG tablet Take 1 tablet (20 mg total) by mouth daily.  . Calcium 600-200 MG-UNIT tablet Take 1 tablet  daily by mouth.  . Cholecalciferol (VITAMIN D3) 2000 UNITS capsule Take 2,000 Units by mouth daily.  Marland Kitchen FOLIC ACID PO Take by mouth daily.  Marland Kitchen levothyroxine (SYNTHROID) 50 MCG tablet Take 1 tablet (50 mcg total) by mouth daily before breakfast.  . Magnesium 250 MG TABS Take 250 mg by mouth daily.  Marland Kitchen olmesartan (BENICAR) 20 MG tablet Take 0.5 tablets (10 mg total) by mouth daily. If BP>130/>80 take another 1/2 pill  . Omega-3 Fatty Acids (FISH OIL) 1000 MG CAPS Take 1,000 mg by mouth daily.   . sertraline (ZOLOFT) 25 MG tablet Take 25 mg by mouth daily.  . vitamin B-12 (CYANOCOBALAMIN) 1000 MCG tablet Take 2,000 mcg daily by mouth. 2 tabs  . Zinc 50 MG CAPS Take 50 mg by mouth daily.   Allergies  Allergen Reactions  . Epinephrine Other (See Comments) and Anaphylaxis    Heart palpitations  . Penicillins Swelling and Other (See Comments)    Lip swelling Has patient had a PCN reaction causing immediate rash, facial/tongue/throat swelling, SOB or lightheadedness with hypotension: Yes Has patient had a PCN reaction causing severe rash involving mucus membranes or skin necrosis: No Has patient had a  PCN reaction that required hospitalization No Has patient had a PCN reaction occurring within the last 10 years: No If all of the above answers are "NO", then may proceed with Cephalosporin use.  . Lidocaine Hcl Other (See Comments)  . Other     Black pepper- caused bronchial tubes to start closing   . Valium [Diazepam] Other (See Comments)    Other reaction(s): Other (See Comments) Overly sensitive  sedation   Recent Results (from the past 2160 hour(s))  SARS CORONAVIRUS 2 (TAT 6-24 HRS) Nasopharyngeal Nasopharyngeal Swab     Status: None   Collection Time: 01/06/20  2:19 PM   Specimen: Nasopharyngeal Swab  Result Value Ref Range   SARS Coronavirus 2 NEGATIVE NEGATIVE    Comment: (NOTE) SARS-CoV-2 target nucleic acids are NOT DETECTED.  The SARS-CoV-2 RNA is generally detectable in upper  and lower respiratory specimens during the acute phase of infection. Negative results do not preclude SARS-CoV-2 infection, do not rule out co-infections with other pathogens, and should not be used as the sole basis for treatment or other patient management decisions. Negative results must be combined with clinical observations, patient history, and epidemiological information. The expected result is Negative.  Fact Sheet for Patients: SugarRoll.be  Fact Sheet for Healthcare Providers: https://www.woods-mathews.com/  This test is not yet approved or cleared by the [place] FDA and  has been authorized for detection and/or diagnosis of SARS-CoV-2 by FDA under an Emergency Use Authorization (EUA). This EUA will remain  in effect (meaning this test can be used) for the duration of the COVID-19 declaration under Se ction 564(b)(1) of the Act, 21 U.S.C. section 360bbb-3(b)(1), unless the authorization is terminated or revoked sooner.  Performed at [facility], [place] [address] [place], [place] [address]   Stool, WBC/Lactoferrin     Status: None   Collection Time: 01/17/20  2:00 PM  Result Value Ref Range   MICRO NUMBER: [id number]    SPECIMEN QUALITY: Adequate    Source STOOL    STATUS: FINAL    Fecal Lactoferrin Negative    COMMENT:      Lactoferrin in the stool is a marker for fecal leukocytes and is a non-specific indicator of intestinal inflammation that may be detected in patients with acute infectious colitis or inflammatory bowel disease. The diagnosis of an acute infectious  process or active IBD cannot be established solely on the basis of a positive result. This test may not be appropriate for immunocompromised persons. In addition, this test is not FDA cleared for patients with a history of HIV and/or Hepatitis B and C,  patients with a history of infectious diarrhea (within 6 months), and patients having had a colostomy  and/or ileostomy within 1 month.   Clostridium difficile Toxin B, Qualitative, Real-Time PCR     Status: None   Collection Time: 01/17/20  2:00 PM  Result Value Ref Range   Toxigenic C. Difficile by PCR NOT DETECTED NOT DETECT    Comment: . This test is for use only with liquid or soft stools;  performance characteristics of other clinical specimen  types have not been established. . This assay was performed by Cepheid GeneXpert(R) PCR. The performance characteristics of this assay have been determined by [employer]. Performance characteristics refer to the analytical performance of the test. . For additional information, please refer to http://education.[employer].com/faq/FAQ136 (This link is being provided for informational/educational purposes only.) .   HM MAMMOGRAPHY     Status: None   Collection  Time: 01/21/20 12:00 AM  Result Value Ref Range   HM Mammogram 0-4 Bi-Rad 0-4 Bi-Rad, Self Reported Normal    Comment: 01/21/2020 negative benign  [name]  NM Myocar Multi W/Spect W/Wall Motion / EF     Status: None   Collection Time: 02/20/20 11:26 AM  Result Value Ref Range   Rest HR 50 bpm   Rest BP 108/69 mmHg   Exercise duration (sec) 0 sec   Percent HR 48 %   Exercise duration (min) 0 min   Estimated workload 1.0 METS   Peak HR 68 bpm   Peak BP 141/63 mmHg   MPHR 141 bpm   SSS 0    SRS 0    SDS 0    TID 1.10    LV sys vol 14 mL   LV dias vol 61 46 - 106 mL   Objective  Body mass index is 30.25 kg/m. Wt Readings from Last 3 Encounters:  02/28/20 187 lb 6.4 oz (85 kg)  02/03/20 188 lb (85.3 kg)  12/27/19 186 lb (84.4 kg)   Temp Readings from Last 3 Encounters:  02/28/20 98.1 F (36.7 C) (Oral)  12/12/19 98.3 F (36.8 C) (Temporal)  11/01/19 (!) 97.5 F (36.4 C) (Temporal)   BP Readings from Last 3 Encounters:  02/28/20 122/68  02/03/20 128/60  12/27/19 120/60   Pulse Readings from Last 3 Encounters:  02/28/20 66  02/03/20 67  12/27/19  64    Physical Exam Vitals and nursing note reviewed.  Constitutional:      Appearance: Normal appearance. She is well-developed and well-groomed. She is obese.  HENT:     Head: Normocephalic and atraumatic.  Eyes:     Conjunctiva/sclera: Conjunctivae normal.     Pupils: Pupils are equal, round, and reactive to light.  Cardiovascular:     Rate and Rhythm: Normal rate and regular rhythm.     Heart sounds: Normal heart sounds. No murmur heard.   Pulmonary:     Effort: Pulmonary effort is normal.     Breath sounds: Normal breath sounds.  Skin:    General: Skin is warm and dry.  Neurological:     General: No focal deficit present.     Mental Status: She is alert. Mental status is at baseline.     Gait: Gait normal.  Psychiatric:        Attention and Perception: Attention and perception normal.        Mood and Affect: Mood and affect normal.        Speech: Speech normal.        Behavior: Behavior normal. Behavior is cooperative.        Thought Content: Thought content normal.        Cognition and Memory: Cognition and memory normal.        Judgment: Judgment normal.     Assessment  Plan  Essential hypertension - Plan: Comprehensive metabolic panel, Lipid panel, CBC with Differential/Platelet Cont meds  Dysplastic nevus F/u derm Dr. Delman Cheadle and derm surgery Dr.Mitkov in [place]   Fatty liver Given info about this today  Diverticulosis Consider barium study vs flex sigmoidoscopy with GI  Hypothyroidism, unspecified type - Plan: TSH Cont levo 50 mcg   Prediabetes - Plan: Hemoglobin A1c  Obesity (BMI 30-39.9)  rec healthy diet and exercise   HM Flu shotgiven 04/2019 Tdap-had TD 10/18/12 not pertussis will need this in future shingrixwill need if has not had  zostervax had 06/16/11 prevnarhad 05/15/14 covid 2/2  vaccines utd pfizer  Pna 23had 12/16/05, 05/18/16  Pap out of age window LMP [place] with implants7/6/21 normal -10/18/16 screening  with implants neg FH breast cancer p cousins dx'ed in 72s x 3, sister Had at age 36 y.o and colon cancer age 73, 2 paternal cousins ovarian cancer age 63 and 27   Colonoscopy 11/2012 tortuous colon no further rec. -she is established and f/u GI Dr. Farrel Gordon [place] virtual coloscopy   DEXA Sadie Haber PCP 06/28/16 normal reviewed report did not scan into chart -h/o vitamin D def on D3 2000 IU daily  [facility]2019 Dr. Delman Cheadle mohs surgery F/u from 04/10/19 left lower leg cellulitis for SCC changed to levaquin rec compression socks and acidic acid soaks qhs   05/15/19 f/u appt s/p SCC mohs removal f/u in 3 months Dr. Winifred Olive  [facility] saw 01/01/19 increased nocturia and urgency pending Botox to tx and UDS Dr. Junious Silk and try myrbetriq before UDS  Cards-Dr. Rockey Situ  GI [facility] GI Dr. Moss Mc MD Dr. Cordelia Pen [facility]-retinal specialist  Dentist Dr. Lynelle Smoke  Neurology-[facility]  ENT-Dr. Tami Ribas for vestibular balance issue s Podiatry Dr. Milinda Pointer  H/o D.r Earlie [facility] Lung-Dr. Silvana Newness Dr. Alvan Dame  Surgery Dr.Toth  Dermatology Dr. Delman Cheadle in [place] h/o SCC left leg and MM right leg,neck and backwith node dissection Dr. Marlou Starks 8/12  Former PCP Dr. Jossie Ng  -pt brought in all records week of 09/21/2018 and reviewed  -h/o iron def anemia  -last seen 07/09/18 and 08/09/18 possible thrush and URI  US carotid 05/13/16 <50% stenosis in right and left ICA -she was at this time put on lipitor 10 mg qhs   CT ab/pelvis 07/26/17 left base scarring lung, well circumcised liver lesion likely cysts and stable hepatomegaly 19.4 cm cranlocaudal pancreas/spleen normal, extensive colonic diverticulosis, aortic and branch vessell atherosclerosis no adenopathy, normal uterus and adnexa, advanced LS spondylosis grade 1 L4/5 and L5/S1 anterolisthesis are similar, pelvic floor laxity, hiatal hernia  CXR 02/26/18 thoracic aorta mildly atherosclerotic unchanged, Deg changes T  spine, Deg changes b/l shoulders prior right shoulder surgery calcification distal left supraspinatus tendon at its insertion on the great tuberosity of the left humerus   UA normal 07/09/18  04/23/18 A1c 6.0  04/23/18 CBC 6.2/14.2/42.9/plts 199  04/23/18 CMET Cr 0.88 BUN 20 EGFR 62, total bili chronically elevated 1.1 (prior direct bili low 0.1 likely unconjugated high) AST15, ALT 14 normal  04/23/18 lipidTC 155, TG 143, HDL 68, LDL 58 10/17/17 vitamin D 41.1 04/18/16 33.7  TSH 06/29/17 2.06  FOBT neg x 3 05/19/16 Provider: Dr. Olivia Mackie McLean-Scocuzza-[facility]

## 2020-03-03 ENCOUNTER — Ambulatory Visit: Payer: PPO | Admitting: Internal Medicine

## 2020-03-10 ENCOUNTER — Telehealth: Payer: Self-pay | Admitting: Gastroenterology

## 2020-03-10 NOTE — Telephone Encounter (Signed)
Okay thanks for letting me know, if she changes her mind she can reschedule at her convenience.

## 2020-03-10 NOTE — Telephone Encounter (Signed)
Pt is requesting to speak with a nurse about the reason behind cancelling her appt.

## 2020-03-10 NOTE — Telephone Encounter (Signed)
Patient reports she has changed her mind.  She does not wish to proceed with colon and pre-visit.  She asked that I cancel the appts. She will call back if she changes her mind. Does not wish to proceed with the barium enema or flex sig options

## 2020-03-25 ENCOUNTER — Other Ambulatory Visit: Payer: Self-pay

## 2020-03-25 ENCOUNTER — Other Ambulatory Visit (INDEPENDENT_AMBULATORY_CARE_PROVIDER_SITE_OTHER): Payer: PPO

## 2020-03-25 DIAGNOSIS — I10 Essential (primary) hypertension: Secondary | ICD-10-CM | POA: Diagnosis not present

## 2020-03-25 DIAGNOSIS — Z1389 Encounter for screening for other disorder: Secondary | ICD-10-CM

## 2020-03-25 DIAGNOSIS — E039 Hypothyroidism, unspecified: Secondary | ICD-10-CM | POA: Diagnosis not present

## 2020-03-25 DIAGNOSIS — R7303 Prediabetes: Secondary | ICD-10-CM

## 2020-03-25 LAB — CBC WITH DIFFERENTIAL/PLATELET
Basophils Absolute: 0.1 10*3/uL (ref 0.0–0.1)
Basophils Relative: 1.3 % (ref 0.0–3.0)
Eosinophils Absolute: 0.2 10*3/uL (ref 0.0–0.7)
Eosinophils Relative: 3.9 % (ref 0.0–5.0)
HCT: 39.5 % (ref 36.0–46.0)
Hemoglobin: 13 g/dL (ref 12.0–15.0)
Lymphocytes Relative: 33 % (ref 12.0–46.0)
Lymphs Abs: 1.8 10*3/uL (ref 0.7–4.0)
MCHC: 32.9 g/dL (ref 30.0–36.0)
MCV: 91 fl (ref 78.0–100.0)
Monocytes Absolute: 0.5 10*3/uL (ref 0.1–1.0)
Monocytes Relative: 9.7 % (ref 3.0–12.0)
Neutro Abs: 2.8 10*3/uL (ref 1.4–7.7)
Neutrophils Relative %: 52.1 % (ref 43.0–77.0)
Platelets: 193 10*3/uL (ref 150.0–400.0)
RBC: 4.34 Mil/uL (ref 3.87–5.11)
RDW: 13.7 % (ref 11.5–15.5)
WBC: 5.4 10*3/uL (ref 4.0–10.5)

## 2020-03-25 LAB — COMPREHENSIVE METABOLIC PANEL
ALT: 15 U/L (ref 0–35)
AST: 16 U/L (ref 0–37)
Albumin: 4.3 g/dL (ref 3.5–5.2)
Alkaline Phosphatase: 74 U/L (ref 39–117)
BUN: 22 mg/dL (ref 6–23)
CO2: 28 mEq/L (ref 19–32)
Calcium: 9.5 mg/dL (ref 8.4–10.5)
Chloride: 106 mEq/L (ref 96–112)
Creatinine, Ser: 1.19 mg/dL (ref 0.40–1.20)
GFR: 43.72 mL/min — ABNORMAL LOW (ref >60.00)
Glucose, Bld: 115 mg/dL — ABNORMAL HIGH (ref 70–99)
Potassium: 4.4 mEq/L (ref 3.5–5.1)
Sodium: 142 mEq/L (ref 135–145)
Total Bilirubin: 1.2 mg/dL (ref 0.2–1.2)
Total Protein: 6.5 g/dL (ref 6.0–8.3)

## 2020-03-25 LAB — LIPID PANEL
Cholesterol: 142 mg/dL (ref 0–200)
HDL: 69.8 mg/dL (ref >39.00)
LDL Cholesterol: 52 mg/dL (ref 0–99)
NonHDL: 71.93
Total CHOL/HDL Ratio: 2
Triglycerides: 99 mg/dL (ref 0.0–149.0)
VLDL: 19.8 mg/dL (ref 0.0–40.0)

## 2020-03-25 LAB — HEMOGLOBIN A1C: Hgb A1c MFr Bld: 6.1 % (ref 4.6–6.5)

## 2020-03-25 LAB — TSH: TSH: 5.26 u[IU]/mL — ABNORMAL HIGH (ref 0.35–4.50)

## 2020-03-26 LAB — URINALYSIS, ROUTINE W REFLEX MICROSCOPIC
Bacteria, UA: NONE SEEN /HPF
Bilirubin Urine: NEGATIVE
Glucose, UA: NEGATIVE
Hgb urine dipstick: NEGATIVE
Hyaline Cast: NONE SEEN /LPF
Ketones, ur: NEGATIVE
Nitrite: NEGATIVE
Protein, ur: NEGATIVE
Specific Gravity, Urine: 1.013 (ref 1.001–1.03)
Squamous Epithelial / HPF: NONE SEEN /HPF (ref <=5)
pH: <=5 (ref 5.0–8.0)

## 2020-03-29 ENCOUNTER — Other Ambulatory Visit: Payer: Self-pay | Admitting: Internal Medicine

## 2020-03-29 DIAGNOSIS — E039 Hypothyroidism, unspecified: Secondary | ICD-10-CM

## 2020-03-29 MED ORDER — LEVOTHYROXINE SODIUM 75 MCG PO TABS: 75.0000 ug | ORAL_TABLET | Freq: Every day | ORAL | 3 refills | Status: DC

## 2020-03-31 ENCOUNTER — Other Ambulatory Visit: Payer: Self-pay

## 2020-03-31 DIAGNOSIS — R944 Abnormal results of kidney function studies: Secondary | ICD-10-CM

## 2020-03-31 DIAGNOSIS — E038 Other specified hypothyroidism: Secondary | ICD-10-CM

## 2020-04-02 ENCOUNTER — Other Ambulatory Visit: Payer: Self-pay

## 2020-04-02 ENCOUNTER — Encounter: Payer: Self-pay | Admitting: Pulmonary Disease

## 2020-04-02 ENCOUNTER — Ambulatory Visit (INDEPENDENT_AMBULATORY_CARE_PROVIDER_SITE_OTHER): Payer: PPO | Admitting: Pulmonary Disease

## 2020-04-02 VITALS — BP 126/68 | HR 77 | Temp 97.5°F | Ht 69.0 in | Wt 189.6 lb

## 2020-04-02 DIAGNOSIS — I5189 Other ill-defined heart diseases: Secondary | ICD-10-CM | POA: Diagnosis not present

## 2020-04-02 DIAGNOSIS — K2100 Gastro-esophageal reflux disease with esophagitis, without bleeding: Secondary | ICD-10-CM | POA: Diagnosis not present

## 2020-04-02 DIAGNOSIS — J683 Other acute and subacute respiratory conditions due to chemicals, gases, fumes and vapors: Secondary | ICD-10-CM

## 2020-04-02 DIAGNOSIS — R059 Cough, unspecified: Secondary | ICD-10-CM

## 2020-04-02 DIAGNOSIS — R05 Cough: Secondary | ICD-10-CM | POA: Diagnosis not present

## 2020-04-02 MED ORDER — BREO ELLIPTA 100-25 MCG/INH IN AEPB
1.0000 | INHALATION_SPRAY | Freq: Every day | RESPIRATORY_TRACT | 12 refills | Status: DC
Start: 2020-04-02 — End: 2020-07-20

## 2020-04-02 NOTE — Progress Notes (Signed)
Subjective:    Patient ID: [name], female    DOB: March 08, 1941, 79 y.o.   MRN: [id number]  HPI Is a 79 year old lifelong never smoker who presents for follow-up cough which started around December 2019.  The cough has previously responded to aggressive management of gastroesophageal reflux.  She does have a hiatal hernia evident on prior CT scan of the chest.  She presents today for follow-up after pulmonary function testing was obtained.  PFTs show some mild airways reactivity consistent with mild asthma.  She does not have any restrictive component.  As noted she has had a prior CT scan of the chest performed in July 2020 that showed no parenchymal abnormalities with the exception of very minimal apical scarring which is noted very frequently particularly after episodes of pneumonia.  This does not constitute pulmonary fibrosis and is limited to the very apices and very minimal.  The patient is very distraught today because she was told she had pulmonary fibrosis and that her lungs were "very scarred" I have had to show her her prior CTs as well as PFTs and there is no evidence of this.  As noted she does have some mild airways reactivity.  Her cough is also in part due to gastroesophageal reflux with laryngopharyngeal component.  The patient was reassured with regards to her pulmonary findings.   She has not had any fevers, chills or sweats.  Cough is usually dry occasionally produces clear mucus.  No hemoptysis.  No chest pain.  No orthopnea or paroxysmal nocturnal dyspnea.  She does get dyspnea on heavy exertion.  Review of Systems A 10 point review of systems was performed and it is as noted above otherwise negative.  Allergies  Allergen Reactions  . Epinephrine Other (See Comments) and Anaphylaxis    Heart palpitations  . Penicillins Swelling and Other (See Comments)    Lip swelling Has patient had a PCN reaction causing immediate rash, facial/tongue/throat swelling, SOB or  lightheadedness with hypotension: Yes Has patient had a PCN reaction causing severe rash involving mucus membranes or skin necrosis: No Has patient had a PCN reaction that required hospitalization No Has patient had a PCN reaction occurring within the last 10 years: No If all of the above answers are "NO", then may proceed with Cephalosporin use.  . Lidocaine Hcl Other (See Comments)  . Other     Black pepper- caused bronchial tubes to start closing   . Valium [Diazepam] Other (See Comments)    Other reaction(s): Other (See Comments) Overly sensitive  sedation   Current Meds  Medication Sig  . albuterol (PROAIR HFA) 108 (90 Base) MCG/ACT inhaler Inhale 1-2 puffs into the lungs every 6 (six) hours as needed for wheezing or shortness of breath.  Marland Kitchen amLODipine (NORVASC) 5 MG tablet Take 1 tablet (5 mg total) by mouth daily. In am with food  . Ascorbic Acid (VITAMIN C) 1000 MG tablet Take 1,000 mg by mouth daily.  Marland Kitchen aspirin 81 MG tablet TAKE 1 TABLET BY MOUTH EVERY DAY  . atorvastatin (LIPITOR) 20 MG tablet Take 1 tablet (20 mg total) by mouth daily.  . Calcium 600-200 MG-UNIT tablet Take 1 tablet daily by mouth.  . Cholecalciferol (VITAMIN D3) 2000 UNITS capsule Take 2,000 Units by mouth daily.  Marland Kitchen FOLIC ACID PO Take by mouth daily.  Marland Kitchen levothyroxine (SYNTHROID) 75 MCG tablet Take 1 tablet (75 mcg total) by mouth daily before breakfast.  . Magnesium 250 MG TABS Take 250 mg by  mouth daily.  Marland Kitchen olmesartan (BENICAR) 20 MG tablet Take 0.5 tablets (10 mg total) by mouth daily. If BP>130/>80 take another 1/2 pill  . Omega-3 Fatty Acids (FISH OIL) 1000 MG CAPS Take 1,000 mg by mouth daily.   . sertraline (ZOLOFT) 25 MG tablet Take 25 mg by mouth daily.  . vitamin B-12 (CYANOCOBALAMIN) 1000 MCG tablet Take 2,000 mcg daily by mouth. 2 tabs  . Zinc 50 MG CAPS Take 50 mg by mouth daily.   Immunization History  Administered Date(s) Administered  . Fluad Quad(high Dose 65+) 04/25/2019  . Influenza  Split 04/18/2017  . Influenza, High Dose Seasonal PF 04/18/2017, 04/12/2018  . Influenza-Unspecified 05/03/2018  . PFIZER SARS-COV-2 Vaccination 08/12/2019, 09/02/2019  . Pneumococcal Conjugate-13 05/15/2014  . Pneumococcal Polysaccharide-23 12/16/2005, 05/18/2016  . Td 10/18/2012  . Zoster Recombinat (Shingrix) 06/16/2011       Objective:   Physical Exam BP 126/68 (BP Location: Left Arm, Cuff Size: Normal)   Pulse 77   Temp (!) 97.5 F (36.4 C) (Temporal)   Ht 5\' 9"  (1.753 m)   Wt 189 lb 9.6 oz (86 kg)   SpO2 96%   BMI 28.00 kg/m   GENERAL: Well-developed somewhat overweight female in no acute distress. HEAD: Normocephalic, atraumatic.  EYES: Pupils equal, round, reactive to light.  No scleral icterus.  MOUTH: Nose/mouth/throat not examined due to masking requirements for COVID 19. NECK: Supple. No thyromegaly. Trachea midline. No JVD.  No adenopathy. PULMONARY: Good air entry bilaterally.  Somewhat coarse, no other adventitious sounds. CARDIOVASCULAR: S1 and S2. Regular rate and rhythm.  No rubs, murmurs or gallops heard. ABDOMEN: Benign. MUSCULOSKELETAL: No joint deformity, no clubbing, no edema.  NEUROLOGIC: No focal deficits, no gait disturbance.  Speech is fluent. SKIN: Intact,warm,dry. PSYCH: Anxious mood, normal behavior.      Assessment & Plan:     ICD-10-CM   1. Reactive airways dysfunction syndrome (HCC)  J68.3    She has airways reactivity Likely mild persistent asthma Trial of Breo Ellipta 100/25, 1 inhalation daily  2. Cough  R05    Multifactorial GERD/LPR major component Airways reactivity may be aggravating  3. Gastroesophageal reflux disease with esophagitis without hemorrhage  K21.00    Hiatal hernia with reflux Prior EGD demonstrating esophagitis Consider increasing PPI to twice a day Cough was better controlled on PPI twice a day  4. Diastolic dysfunction  W96.04    Followed by cardiology Note that prior CT scan of the chest also showed  coronary calcifications Defer to cardiology for further work-up   Meds ordered this encounter  Medications  . fluticasone furoate-vilanterol (BREO ELLIPTA) 100-25 MCG/INH AEPB    Sig: Inhale 1 puff into the lungs daily.    Dispense:  28 each    Refill:  12   Discussion:  Patient has had issues with cough that previously have responded to twice a day PPI.  Cough has worsened since the PPI has been decreased to once a day.  She has had extensive work-up that shows that she does have some mild airways reactivity.  We will give her a trial of Breo Ellipta to see if this helps with her symptoms as well.  Please note: She does not have nor has ever had pulmonary fibrosis.  There is very minimal pleural-parenchymal apical scarring that may be a result from prior infectious process.  This change is very minimal as noted by prior CT and has no physiologic impact.  The patient was reassured that she does not  have progressive pulmonary fibrosis.  Renold Don, MD [facility]  *This note was dictated using voice recognition software/Dragon.  Despite best efforts to proofread, errors can occur which can change the meaning.  Any change was purely unintentional.

## 2020-04-02 NOTE — Patient Instructions (Signed)
You have a mild case of asthma.  We are going to give you a trial of Breo Ellipta 1 inhalation daily.  Continue using your Ventolin (albuterol) rescue inhaler only if needed.  We will see you in follow-up in 4 months time call sooner should any new problems arise.

## 2020-04-03 ENCOUNTER — Telehealth: Payer: Self-pay | Admitting: Internal Medicine

## 2020-04-03 ENCOUNTER — Encounter: Payer: PPO | Admitting: Gastroenterology

## 2020-04-03 NOTE — Telephone Encounter (Signed)
[facility] call and needs clarification on patient's amLODipine (NORVASC) 5 MG tablet, is it 5mg  or 2mg  or total of 7mg 

## 2020-04-03 NOTE — Telephone Encounter (Signed)
Please advise 

## 2020-04-06 NOTE — Telephone Encounter (Signed)
Norvasc is 5 mg  D/c 2.5 mg dose  Call [facility]   Thanks tMS

## 2020-04-07 NOTE — Telephone Encounter (Signed)
Pharmacy only needing medication re-sent electronically. This has been done

## 2020-04-10 ENCOUNTER — Telehealth: Payer: Self-pay | Admitting: Gastroenterology

## 2020-04-10 ENCOUNTER — Telehealth: Payer: Self-pay | Admitting: Internal Medicine

## 2020-04-10 NOTE — Telephone Encounter (Signed)
Left message to return call.  Patient was established with GI. She should be encouraged to reach out to them.

## 2020-04-10 NOTE — Telephone Encounter (Signed)
Pt states that lately she has been experiencing back pain in left lower side that sometimes radiates to the right side, some nausea ,no fever. She states that she was dx with severe diverticulosis and is wondering if it turned into diverticulosis. She would like some advise.

## 2020-04-10 NOTE — Telephone Encounter (Signed)
Patient was returning call for the message she left for Dr.Tracy

## 2020-04-10 NOTE — Telephone Encounter (Signed)
Patient called in stated that she think she has diverticulitis  She stated that she has back pain left lower side, some nausea ,no fever wanted to know what she should do.

## 2020-04-10 NOTE — Telephone Encounter (Signed)
Patient informed of the below. States she had been diagnosed with severe diverticulosis and has been having symptoms. She will reach out to her Gi doctor for an urgent appointment.

## 2020-04-10 NOTE — Telephone Encounter (Signed)
Dr. Ardis Hughs as DOD, [place] patient history of diverticulosis, she states that she is unable to have a colonoscopy because they can't ever complete it, pt declined flex sig previously, had a virtual CT on 02/25/20  Pt reports that she has LLQ pain that has been going on for a week or so, pt states that at times she feels a stabbing pain, mostly a constant dull pain, nausea, no fever, pt states that sometimes the pain radiates to her left side, pt states that she has had diarrhea for so long she is not sure if it is related or not. Please advise, thank you

## 2020-04-13 ENCOUNTER — Telehealth: Payer: Self-pay | Admitting: Cardiovascular Disease

## 2020-04-13 ENCOUNTER — Other Ambulatory Visit: Payer: Self-pay

## 2020-04-13 DIAGNOSIS — R197 Diarrhea, unspecified: Secondary | ICD-10-CM

## 2020-04-13 NOTE — Telephone Encounter (Signed)
Patient wants to speak with there nurse about a non urgent private matter.   Please call.

## 2020-04-13 NOTE — Telephone Encounter (Signed)
I didn't see this Friday afternoon.  She needs labs (cbc, cmet, stool GI pathogen panel).      Ardine Bjork

## 2020-04-13 NOTE — Telephone Encounter (Signed)
Lab order in epic. Left detailed message on patient's voicemail letting her know that we would like her to come in for lab work. Advised patient that no appointment is necessary, that she can go by at her convenience between 7:30-5, Monday through Friday. Advised patient to give me a call back if she had any questions or concerns.

## 2020-04-13 NOTE — Telephone Encounter (Signed)
Spoke with patient and she called in regarding some results and review by Dr. Rockey Situ. She states that when she saw Dr. Rockey Situ last time he commented on her getting the COVID vaccine that it was a good thing because her lungs are so scarred. She states that not long after that she went to see pulmonary and questioned them about results. She reports being told that maybe provider was looking at another patients images. She states that during her younger years she mixed ammonia and clorox which has caused her lung problems. Encouraged patient to call medical records to request reports from last year so that she can have them for her records for review. She verbalized understanding with no further questions at this time.

## 2020-04-13 NOTE — Telephone Encounter (Signed)
Thanks Linna Hoff. She has had a virtual colonoscopy recently and lab work which has been unrevealing. We can repeat labs to ensure stable but loose stools seem chronic and persistent. I have recommended colonoscopy previously and she declined. If symptoms persist without clear etiology, she should reconsider the colonoscopy.

## 2020-04-14 ENCOUNTER — Other Ambulatory Visit (INDEPENDENT_AMBULATORY_CARE_PROVIDER_SITE_OTHER): Payer: PPO

## 2020-04-14 ENCOUNTER — Other Ambulatory Visit: Payer: Self-pay

## 2020-04-14 DIAGNOSIS — R944 Abnormal results of kidney function studies: Secondary | ICD-10-CM

## 2020-04-14 DIAGNOSIS — E038 Other specified hypothyroidism: Secondary | ICD-10-CM | POA: Diagnosis not present

## 2020-04-14 DIAGNOSIS — Z23 Encounter for immunization: Secondary | ICD-10-CM

## 2020-04-14 DIAGNOSIS — R197 Diarrhea, unspecified: Secondary | ICD-10-CM

## 2020-04-14 LAB — COMPREHENSIVE METABOLIC PANEL
ALT: 15 U/L (ref 0–35)
AST: 14 U/L (ref 0–37)
Albumin: 4.4 g/dL (ref 3.5–5.2)
Alkaline Phosphatase: 63 U/L (ref 39–117)
BUN: 21 mg/dL (ref 6–23)
CO2: 26 mEq/L (ref 19–32)
Calcium: 9.6 mg/dL (ref 8.4–10.5)
Chloride: 105 mEq/L (ref 96–112)
Creatinine, Ser: 1.09 mg/dL (ref 0.40–1.20)
GFR: 48.38 mL/min — ABNORMAL LOW (ref >60.00)
Glucose, Bld: 121 mg/dL — ABNORMAL HIGH (ref 70–99)
Potassium: 4.4 mEq/L (ref 3.5–5.1)
Sodium: 140 mEq/L (ref 135–145)
Total Bilirubin: 1.3 mg/dL — ABNORMAL HIGH (ref 0.2–1.2)
Total Protein: 6.9 g/dL (ref 6.0–8.3)

## 2020-04-14 LAB — CBC WITH DIFFERENTIAL/PLATELET
Basophils Absolute: 0 10*3/uL (ref 0.0–0.1)
Basophils Relative: 0.8 % (ref 0.0–3.0)
Eosinophils Absolute: 0.2 10*3/uL (ref 0.0–0.7)
Eosinophils Relative: 2.7 % (ref 0.0–5.0)
HCT: 41.4 % (ref 36.0–46.0)
Hemoglobin: 13.6 g/dL (ref 12.0–15.0)
Lymphocytes Relative: 30.3 % (ref 12.0–46.0)
Lymphs Abs: 1.8 10*3/uL (ref 0.7–4.0)
MCHC: 32.7 g/dL (ref 30.0–36.0)
MCV: 90.9 fl (ref 78.0–100.0)
Monocytes Absolute: 0.5 10*3/uL (ref 0.1–1.0)
Monocytes Relative: 8.9 % (ref 3.0–12.0)
Neutro Abs: 3.4 10*3/uL (ref 1.4–7.7)
Neutrophils Relative %: 57.3 % (ref 43.0–77.0)
Platelets: 197 10*3/uL (ref 150.0–400.0)
RBC: 4.56 Mil/uL (ref 3.87–5.11)
RDW: 13.4 % (ref 11.5–15.5)
WBC: 5.9 10*3/uL (ref 4.0–10.5)

## 2020-04-15 ENCOUNTER — Other Ambulatory Visit: Payer: PPO

## 2020-04-15 ENCOUNTER — Other Ambulatory Visit: Payer: Self-pay

## 2020-04-15 DIAGNOSIS — R197 Diarrhea, unspecified: Secondary | ICD-10-CM | POA: Diagnosis not present

## 2020-04-15 LAB — BASIC METABOLIC PANEL
BUN/Creatinine Ratio: 18 (calc) (ref 6–22)
BUN: 20 mg/dL (ref 7–25)
CO2: 23 mmol/L (ref 20–32)
Calcium: 9.6 mg/dL (ref 8.6–10.4)
Chloride: 106 mmol/L (ref 98–110)
Creat: 1.1 mg/dL — ABNORMAL HIGH (ref 0.60–0.93)
Glucose, Bld: 123 mg/dL — ABNORMAL HIGH (ref 65–99)
Potassium: 4.6 mmol/L (ref 3.5–5.3)
Sodium: 140 mmol/L (ref 135–146)

## 2020-04-15 LAB — TSH: TSH: 3.22 mIU/L (ref 0.40–4.50)

## 2020-04-15 NOTE — Addendum Note (Signed)
Addended by: Tor Netters I on: 04/15/2020 11:28 AM   Modules accepted: Orders

## 2020-04-15 NOTE — Telephone Encounter (Signed)
Prior CT report in 2020 "Lungs/Pleura: Chronic scarring/fibrosis at the lung apices." It would be up to pulmonary to determine what this means

## 2020-04-15 NOTE — Telephone Encounter (Addendum)
Reviewed provider review and she states that she did get her records and may consider second opinion with pulmonary for further evaluation of her results.

## 2020-04-19 LAB — GI PROFILE, STOOL, PCR

## 2020-04-20 ENCOUNTER — Telehealth: Payer: Self-pay | Admitting: Pulmonary Disease

## 2020-04-23 DIAGNOSIS — Z20822 Contact with and (suspected) exposure to covid-19: Secondary | ICD-10-CM | POA: Diagnosis not present

## 2020-04-23 NOTE — Telephone Encounter (Signed)
Nothing noted in message. Encounter created in error. Will sign off.  °

## 2020-04-30 DIAGNOSIS — H2513 Age-related nuclear cataract, bilateral: Secondary | ICD-10-CM | POA: Diagnosis not present

## 2020-04-30 DIAGNOSIS — H35372 Puckering of macula, left eye: Secondary | ICD-10-CM | POA: Diagnosis not present

## 2020-04-30 DIAGNOSIS — H04123 Dry eye syndrome of bilateral lacrimal glands: Secondary | ICD-10-CM | POA: Diagnosis not present

## 2020-04-30 DIAGNOSIS — H35363 Drusen (degenerative) of macula, bilateral: Secondary | ICD-10-CM | POA: Diagnosis not present

## 2020-04-30 DIAGNOSIS — H43811 Vitreous degeneration, right eye: Secondary | ICD-10-CM | POA: Diagnosis not present

## 2020-05-03 ENCOUNTER — Other Ambulatory Visit: Payer: Self-pay

## 2020-05-03 ENCOUNTER — Emergency Department: Payer: PPO

## 2020-05-03 ENCOUNTER — Emergency Department
Admission: EM | Admit: 2020-05-03 | Discharge: 2020-05-03 | Disposition: A | Discharge status: Home / Self Care | Payer: PPO | Attending: Student in an Organized Health Care Education/Training Program | Admitting: Student in an Organized Health Care Education/Training Program

## 2020-05-03 DIAGNOSIS — Z79890 Hormone replacement therapy (postmenopausal): Secondary | ICD-10-CM | POA: Insufficient documentation

## 2020-05-03 DIAGNOSIS — M19072 Primary osteoarthritis, left ankle and foot: Secondary | ICD-10-CM | POA: Diagnosis not present

## 2020-05-03 DIAGNOSIS — Z85828 Personal history of other malignant neoplasm of skin: Secondary | ICD-10-CM | POA: Insufficient documentation

## 2020-05-03 DIAGNOSIS — J45909 Unspecified asthma, uncomplicated: Secondary | ICD-10-CM | POA: Diagnosis not present

## 2020-05-03 DIAGNOSIS — Z79899 Other long term (current) drug therapy: Secondary | ICD-10-CM | POA: Diagnosis not present

## 2020-05-03 DIAGNOSIS — Z96652 Presence of left artificial knee joint: Secondary | ICD-10-CM | POA: Insufficient documentation

## 2020-05-03 DIAGNOSIS — M7989 Other specified soft tissue disorders: Secondary | ICD-10-CM | POA: Diagnosis not present

## 2020-05-03 DIAGNOSIS — R600 Localized edema: Secondary | ICD-10-CM | POA: Diagnosis not present

## 2020-05-03 DIAGNOSIS — M79605 Pain in left leg: Secondary | ICD-10-CM

## 2020-05-03 DIAGNOSIS — I10 Essential (primary) hypertension: Secondary | ICD-10-CM | POA: Diagnosis not present

## 2020-05-03 DIAGNOSIS — I2510 Atherosclerotic heart disease of native coronary artery without angina pectoris: Secondary | ICD-10-CM | POA: Insufficient documentation

## 2020-05-03 DIAGNOSIS — Z7982 Long term (current) use of aspirin: Secondary | ICD-10-CM | POA: Diagnosis not present

## 2020-05-03 DIAGNOSIS — E039 Hypothyroidism, unspecified: Secondary | ICD-10-CM | POA: Diagnosis not present

## 2020-05-03 DIAGNOSIS — M79662 Pain in left lower leg: Secondary | ICD-10-CM | POA: Diagnosis not present

## 2020-05-03 DIAGNOSIS — Z96651 Presence of right artificial knee joint: Secondary | ICD-10-CM | POA: Insufficient documentation

## 2020-05-03 MED ORDER — TRAMADOL HCL 50 MG PO TABS
50.0000 mg | ORAL_TABLET | Freq: Four times a day (QID) | ORAL | 0 refills | Status: DC | PRN
Start: 2020-05-03 — End: 2020-07-20

## 2020-05-03 NOTE — ED Notes (Signed)
Pt presents to the ED for LLE swelling and pain. Pt states she noticed the swelling first in her L leg and then she started having pain in her shin and ankle. Pt states she is unable to bear any weight on her leg, it causes pain. Pt has a hx of neuropathy, skin cancer in her L leg, and cellulitis. Pt is A&Ox4 and NAD at this time.

## 2020-05-03 NOTE — Discharge Instructions (Addendum)
Follow-up with your primary care provider if any continued problems or concerns.  Return to the emergency department for any severe worsening of your symptoms.  Ultrasound today shows that there is no blood clots in your left leg.  Also x-rays showed arthritis in your left foot but no other bone problems were seen.  Take medication with food.  Be aware that this medication could cause drowsiness and increase your risk for falling.  Do not drive or operate machinery while taking this medication.  You may also take Tylenol with this medication if additional pain medication is needed.

## 2020-05-03 NOTE — ED Triage Notes (Signed)
Pt comes pov with left lower leg swelling and pain. Unable to put much pressure on it. Denies any falls.

## 2020-05-03 NOTE — ED Provider Notes (Signed)
[facility] Emergency Department Provider Note  ____________________________________________   First MD Initiated Contact with Patient 05/03/20 1238     (approximate)  I have reviewed the triage vital signs and the nursing notes.   HISTORY  Chief Complaint Foot Pain   HPI [name] is a 79 y.o. female presents to the ED with complaint of left lower extremity pain and swelling for the last 2 days.  States there is been no history of injury.  She denies any past history of DVTs but does have a history of cellulitis 1 year.  She denies any recent travel.  She recalls that she had a "bump up" on her anterior tibia but did not cause her to fall.  No over-the-counter medication has been taken for this.         Past Medical History:  Diagnosis Date  . Acid reflux   . Allergy   . Anemia   . Anxiety    in past  . Arthritis    all over  . Asthma   . Atypical moles   . Bronchitis   . Bronchospasm    reactive with perfume/cologne and smoke  . CAD (coronary artery disease)    patient denies on preop of 10/24   . Candida esophagitis ([place])   . Carotid artery calcification, bilateral 04/2016  . Cataract   . Cervical stenosis of spine   . Chronic insomnia   . Complication of anesthesia    shoulder surgery- not all the way asleep  . Depression    in past  . Diverticulitis   . Diverticulosis   . Dizziness   . Elevated liver enzymes   . Family history of adverse reaction to anesthesia    sister- has problems with nausea and vomiting   . Fatigue   . Fatty liver   . Generalized headache    migraines  . GERD (gastroesophageal reflux disease)   . History of chemotherapy    topical cream for h/o skin CA  . History of kidney stones   . History of neuropathy    FH Dr. Posey Pronto neurology  . History of vertigo   . Hx of migraines   . Hyperlipidemia   . Hypothyroidism   . Insomnia   . Irritable bowel syndrome   . LLQ abdominal pain   . Melanoma  ([place]) 2013  . Melanoma ([place])    x2 stg II melanoma  . Neuropathy, peripheral   . Peripheral neuropathy   . Pneumonia    hx of x 2  . PONV (postoperative nausea and vomiting)   . Pre-diabetes   . Retina disorder    left  . SCC (squamous cell carcinoma)    skin   . Stroke [facility])    ? TIA per Dr V per patient   . Thyroid disease   . Urine incontinence   . Vitamin D deficiency   . Weight gain     Patient Active Problem List   Diagnosis Date Noted  . Dysplastic nevus 02/28/2020  . Diverticulosis 02/28/2020  . Obesity (BMI 30-39.9) 02/28/2020  . SCCA (squamous cell carcinoma) of skin 01/03/2020  . Pulmonary fibrosis ([place]) 11/01/2019  . Scarring of lung 11/01/2019  . Liver cyst 11/01/2019  . Essential hypertension 11/01/2019  . Carotid artery stenosis 08/13/2019  . Cervical spinal stenosis 08/13/2019  . Skin cancer 07/17/2019  . Leg wound, left 05/14/2019  . Chronic cough 02/19/2019  . Irritable bowel syndrome with diarrhea 02/19/2019  .  Depression, recurrent ([place]) 02/19/2019  . [place] 10/27/2018  . Aortic atherosclerosis ([place]) 10/10/2018  . Chest tightness 10/10/2018  . Idiopathic neuropathy 09/13/2018  . History of stroke 09/13/2018  . History of squamous cell carcinoma of skin 09/13/2018  . History of depression 09/13/2018  . History of migraine 09/13/2018  . Fatty liver 09/13/2018  . GERD (gastroesophageal reflux disease) 09/13/2018  . History of memory loss 09/13/2018  . Prediabetes 09/13/2018  . Hiatal hernia 09/13/2018  . Female stress incontinence 05/08/2017  . Pain in pelvis 05/08/2017  . Coronary artery calcification seen on CAT scan 04/09/2017  . Carotid stenosis, asymptomatic, bilateral 04/09/2017  . Hyperlipidemia 04/09/2017  . MCI (mild cognitive impairment) 12/17/2015  . Urinary frequency 08/09/2015  . Hypothyroidism 08/09/2015  . Syncope 08/08/2015  . Posterior vitreous detachment of right eye 07/23/2015  . Hereditary and idiopathic peripheral  neuropathy 05/11/2015  . Epiretinal membrane, left 04/12/2012  . Nuclear cataract 04/12/2012  . Breast pain in female 07/18/2011  . Melanoma of back ([place]) 02/28/2011    Past Surgical History:  Procedure Laterality Date  . BILATERAL SALPINGOOPHORECTOMY     ovarian cyst Dr. Ouida Sills and Dr. Alisia Ferrari 2008  . BREAST SURGERY     in 2014 bx   . BREAST SURGERY     augmentation with saline Dr. Hubbard Hartshorn 1995   . CHOLECYSTECTOMY N/A 01/28/2015   Procedure: LAPAROSCOPIC CHOLECYSTECTOMY WITH INTRAOPERATIVE CHOLANGIOGRAM;  Surgeon: Autumn Messing III, MD;  Location: [facility];  Service: General;  Laterality: N/A;  . CLOSED MANIPULATION SHOULDER     rt  . COLONOSCOPY WITH PROPOFOL N/A 11/20/2012   Procedure: COLONOSCOPY WITH PROPOFOL;  Surgeon: Garlan Fair, MD;  Location: [facility];  Service: Endoscopy;  Laterality: N/A;  . colonscopy     May 2014  . JOINT REPLACEMENT     RTK  . KNEE ARTHROSCOPY    . left knee miniscus tear repair    . MELANOMA EXCISION WITH SENTINEL LYMPH NODE BIOPSY  03/18/11   Back, nodes neg  . OOPHORECTOMY    . ROTATOR CUFF REPAIR     left  . SHOULDER SURGERY     right and left   . SKIN CANCER EXCISION     multiple  . TOTAL HIP ARTHROPLASTY    . TOTAL KNEE ARTHROPLASTY     right 2010 and left in 2018   . TOTAL KNEE ARTHROPLASTY Left 05/16/2017   Procedure: LEFT TOTAL KNEE ARTHROPLASTY;  Surgeon: Paralee Cancel, MD;  Location: [facility];  Service: Orthopedics;  Laterality: Left;  70 mins  . TUBAL LIGATION    . TUMOR REMOVAL     ovary    Prior to Admission medications   Medication Sig Start Date End Date Taking? Authorizing Provider  albuterol (PROAIR HFA) 108 (90 Base) MCG/ACT inhaler Inhale 1-2 puffs into the lungs every 6 (six) hours as needed for wheezing or shortness of breath. 11/01/19   McLean-Scocuzza, Nino Glow, MD  amLODipine (NORVASC) 5 MG tablet Take 1 tablet (5 mg total) by mouth daily. In am with food 09/10/19   McLean-Scocuzza, Nino Glow, MD  Ascorbic Acid  (VITAMIN C) 1000 MG tablet Take 1,000 mg by mouth daily.    [provider]  aspirin 81 MG tablet TAKE 1 TABLET BY MOUTH EVERY DAY 04/23/18   [provider]  atorvastatin (LIPITOR) 20 MG tablet Take 1 tablet (20 mg total) by mouth daily. 02/03/20   Minna Merritts, MD  Calcium 600-200 MG-UNIT tablet Take 1  tablet daily by mouth.    [provider]  Cholecalciferol (VITAMIN D3) 2000 UNITS capsule Take 2,000 Units by mouth daily.    [provider]  fluticasone furoate-vilanterol (BREO ELLIPTA) 100-25 MCG/INH AEPB Inhale 1 puff into the lungs daily. 04/02/20   Tyler Pita, MD  FOLIC ACID PO Take by mouth daily.    [provider]  levothyroxine (SYNTHROID) 75 MCG tablet Take 1 tablet (75 mcg total) by mouth daily before breakfast. 03/29/20   McLean-Scocuzza, Nino Glow, MD  Magnesium 250 MG TABS Take 250 mg by mouth daily.    [provider]  olmesartan (BENICAR) 20 MG tablet Take 0.5 tablets (10 mg total) by mouth daily. If BP>130/>80 take another 1/2 pill 11/01/19   McLean-Scocuzza, Nino Glow, MD  Omega-3 Fatty Acids (FISH OIL) 1000 MG CAPS Take 1,000 mg by mouth daily.     [provider]  omeprazole (PRILOSEC) 20 MG capsule Take 1 capsule (20 mg total) by mouth 2 (two) times daily before a meal. Patient taking differently: Take 20 mg by mouth daily.  12/28/18 02/03/20  Tyler Pita, MD  sertraline (ZOLOFT) 25 MG tablet Take 25 mg by mouth daily.    [provider]  traMADol (ULTRAM) 50 MG tablet Take 1 tablet (50 mg total) by mouth every 6 (six) hours as needed. 05/03/20   Johnn Hai, PA-C  vitamin B-12 (CYANOCOBALAMIN) 1000 MCG tablet Take 2,000 mcg daily by mouth. 2 tabs    [provider]  Zinc 50 MG CAPS Take 50 mg by mouth daily.    [provider]    Allergies Epinephrine, Penicillins, Lidocaine hcl, Other, and Valium [diazepam]  Family History  Problem Relation Age of Onset  .  Hypertension Mother   . Stroke Mother   . Hyperlipidemia Mother   . Heart disease Father   . Diabetes Father   . Hypertension Father   . Hyperlipidemia Father   . Early death Father   . Diabetes Sister   . Heart disease Sister        heart murmur  . Arthritis Sister   . COPD Sister   . Hyperlipidemia Sister   . Hypertension Sister   . Pancreatic cancer Maternal Uncle   . Alzheimer's disease Paternal Aunt   . Colon cancer Maternal Grandfather   . Cancer Maternal Grandfather   . Early death Maternal Grandfather   . Alzheimer's disease Paternal Aunt   . Alzheimer's disease Paternal Aunt   . Colon polyps Sister        adenomous  . Arthritis Sister   . Heart disease Sister   . Hyperlipidemia Sister   . Hypertension Sister   . Cancer Sister   . Heart disease Daughter   . Hyperlipidemia Daughter   . Hypertension Daughter   . Heart disease Son   . Hyperlipidemia Son   . Alcohol abuse Son   . Depression Son   . Diabetes Son   . Stroke Maternal Grandmother   . Early death Paternal Grandmother   . Stroke Paternal Grandmother   . Cancer Other        2 paternal uncles and 1 maternal uncle colon cancer  . Alzheimer's disease Paternal Aunt     Social History Social History   Tobacco Use  . Smoking status: Never Smoker  . Smokeless tobacco: Never Used  Vaping Use  . Vaping Use: Never used  Substance Use Topics  . Alcohol use: Yes  Alcohol/week: 1.0 standard drink    Types: 1 Cans of beer per week    Comment: once a week beer or wine  . Drug use: No    Review of Systems Constitutional: No fever/chills Eyes: No visual changes. ENT: No sore throat. Cardiovascular: Denies chest pain. Respiratory: Denies shortness of breath. Gastrointestinal: No abdominal pain.  No nausea, no vomiting.   Genitourinary: Negative for dysuria. Musculoskeletal: Positive for left lower leg pain. Skin: Negative for rash. Neurological: Negative for headaches, focal weakness or  numbness.  ____________________________________________   PHYSICAL EXAM:  VITAL SIGNS: ED Triage Vitals [05/03/20 1159]  Enc Vitals Group     BP (!) 109/55     Pulse Rate 66     Resp 18     Temp 97.7 F (36.5 C)     Temp Source Oral     SpO2 96 %     Weight 185 lb (83.9 kg)     Height 5\' 6"  (1.676 m)     Head Circumference      Peak Flow      Pain Score 9     Pain Loc      Pain Edu?      Excl. in [place]?     Constitutional: Alert and oriented. Well appearing and in no acute distress. Eyes: Conjunctivae are normal. PERRL. EOMI. Head: Atraumatic. Nose: No congestion/rhinnorhea. Mouth/Throat: Mucous membranes are moist.  Oropharynx non-erythematous. Neck: No stridor.   Cardiovascular: Normal rate, regular rhythm. Grossly normal heart sounds.  Good peripheral circulation. Respiratory: Normal respiratory effort.  No retractions. Lungs CTAB. Gastrointestinal: Soft and nontender. No distention. Musculoskeletal: On examination of the left lower extremity there is no gross swelling comparison with the right.  No erythema or warmth is noted on palpation of the lower extremity.  Pulses present.  Skin is intact and no abrasions or discoloration is present.  Patient is able to move digits distally.  Bevelyn Buckles' sign was negative.  There is no point tenderness on palpation of the ankle, knee or pain with compression of the left hip.  Range of motion is slow and guarded.  Patient is able to weight-bear with the assistance of a cane. Neurologic:  Normal speech and language. No gross focal neurologic deficits are appreciated.  Skin:  Skin is warm, dry and intact. No rash noted. Psychiatric: Mood and affect are normal. Speech and behavior are normal.  ____________________________________________   LABS (all labs ordered are listed, but only abnormal results are displayed)  Labs Reviewed - No data to display ____________________________________________  RADIOLOGY I, Johnn Hai,  personally viewed and evaluated these images (plain radiographs) as part of my medical decision making, as well as reviewing the written report by the radiologist.   Official radiology report(s): DG Tibia/Fibula Left  Result Date: 05/03/2020 CLINICAL DATA:  Pain in mid tibia.  Swelling. EXAM: LEFT TIBIA AND FIBULA - 2 VIEW COMPARISON:  None. FINDINGS: The patient is status post left knee replacement. Hardware is in good position. No bony or soft tissue abnormalities otherwise identified. IMPRESSION: No cause for the patient's symptoms noted.  No acute abnormalities. Electronically Signed   By: Dorise Bullion III M.D   On: 05/03/2020 14:27   US Venous Img Lower Unilateral Left  Result Date: 05/03/2020 CLINICAL DATA:  Left lower extremity pain and edema for 3 days. EXAM: LEFT LOWER EXTREMITY VENOUS DOPPLER ULTRASOUND TECHNIQUE: Gray-scale sonography with compression, as well as color and duplex ultrasound, were performed to evaluate the deep venous system(s)  from the level of the common femoral vein through the popliteal and proximal calf veins. COMPARISON:  None. FINDINGS: VENOUS Normal compressibility of the common femoral, superficial femoral, and popliteal veins, as well as the visualized calf veins. Visualized portions of profunda femoral vein and great saphenous vein unremarkable. No filling defects to suggest DVT on grayscale or color Doppler imaging. Doppler waveforms show normal direction of venous flow, normal respiratory plasticity and response to augmentation. Limited views of the contralateral common femoral vein are unremarkable. OTHER None. Limitations: none IMPRESSION: Negative. Electronically Signed   By: Dorise Bullion III M.D   On: 05/03/2020 13:12   DG Foot Complete Left  Result Date: 05/03/2020 CLINICAL DATA:  Pain for 2 days with no injury. EXAM: LEFT FOOT - COMPLETE 3+ VIEW COMPARISON:  None. FINDINGS: Mild degenerative changes at the first MTP joint. No other bony  abnormalities are identified. No fractures. IMPRESSION: Mild degenerative changes at the first MTP joint. No other abnormalities. Electronically Signed   By: Dorise Bullion III M.D   On: 05/03/2020 14:29    ____________________________________________   PROCEDURES  Procedure(s) performed (including Critical Care):  Procedures   ____________________________________________   INITIAL IMPRESSION / ASSESSMENT AND PLAN / ED COURSE  As part of my medical decision making, I reviewed the following data within the electronic MEDICAL RECORD NUMBER Notes from prior ED visits and [place] Controlled Substance Database  79 year old female presents to the ED with complaint of left leg swelling and pain that began 2 days ago.  Patient denies any recent injury and also there is been no travel.  Patient denies any previous history of DVT.  Ultrasound of the left lower extremity is negative for DVT.  X-rays of the tib-fib and left foot also are negative for any acute bony injury.  Patient was given a prescription for tramadol 1 every 6 hours as needed for pain.  She is to follow-up with her PCP if any continued problems.  She also is encouraged to return to the emergency department if any severe worsening of her symptoms or urgent concerns.  ____________________________________________   FINAL CLINICAL IMPRESSION(S) / ED DIAGNOSES  Final diagnoses:  Acute leg pain, left     ED Discharge Orders         Ordered    traMADol (ULTRAM) 50 MG tablet  Every 6 hours PRN        05/03/20 1439          *Please note:  LIBRA GATZ was evaluated in Emergency Department on 05/03/2020 for the symptoms described in the history of present illness. She was evaluated in the context of the global COVID-19 pandemic, which necessitated consideration that the patient might be at risk for infection with the SARS-CoV-2 virus that causes COVID-19. Institutional protocols and algorithms that pertain to the evaluation of patients  at risk for COVID-19 are in a state of rapid change based on information released by regulatory bodies including the CDC and federal and state organizations. These policies and algorithms were followed during the patient's care in the ED.  Some ED evaluations and interventions may be delayed as a result of limited staffing during and the pandemic.*   Note:  This document was prepared using Dragon voice recognition software and may include unintentional dictation errors.    Johnn Hai, PA-C 05/03/20 1521    Merlyn Lot, MD 05/03/20 1550

## 2020-05-05 ENCOUNTER — Other Ambulatory Visit: Payer: Self-pay

## 2020-05-05 MED ORDER — SERTRALINE HCL 25 MG PO TABS: 25.0000 mg | ORAL_TABLET | Freq: Every day | ORAL | 1 refills | Status: DC

## 2020-05-06 ENCOUNTER — Other Ambulatory Visit: Payer: PPO

## 2020-05-07 ENCOUNTER — Other Ambulatory Visit: Payer: Self-pay

## 2020-05-07 ENCOUNTER — Ambulatory Visit (INDEPENDENT_AMBULATORY_CARE_PROVIDER_SITE_OTHER): Payer: PPO | Admitting: Emergency Medicine

## 2020-05-07 ENCOUNTER — Encounter: Payer: Self-pay | Admitting: Emergency Medicine

## 2020-05-07 DIAGNOSIS — R9389 Abnormal findings on diagnostic imaging of other specified body structures: Secondary | ICD-10-CM | POA: Insufficient documentation

## 2020-05-07 DIAGNOSIS — R0609 Other forms of dyspnea: Secondary | ICD-10-CM

## 2020-05-07 DIAGNOSIS — R0600 Dyspnea, unspecified: Secondary | ICD-10-CM | POA: Diagnosis not present

## 2020-05-07 NOTE — Progress Notes (Signed)
Subjective:    Patient ID: [name], female    DOB: 11-19-1940, 79 y.o.   MRN: [id number]   HPI 79 year old never smoker with a history of GERD and hiatal hernia, allergic rhinitis, hyperlipidemia, hypothyroidism, and melanoma.  [name] carries a diagnosis of asthma that was made when [name] was being evaluated for cough by Dr Patsey Berthold. Last seen 04/02/20. A trial of breo was ordered. [name] reports that [name] has exertional SOB w walking 50 yrds. [name] has trouble with hills or stairs. No wheeze. [name] does have a dry cough, can happen at any time, dry - never productive, has had for years. Does not wake her from sleep.   [name] has had a reassuring cardiology evaluation, low risk cardiac stress test 02/20/20.   [name] is concerned that [name] may have ILD based on reports from her prior CT Chest.   CT chest 01/21/2019 reviewed by me, performed for cough and dyspnea, shows coronary calcifications, normal mediastinum with a small hiatal hernia, some mild apical scar without any evidence of interstitial disease, ILD.  Pulmonary function testing done 01/07/2020 reviewed by me, shows probable mild obstruction with a borderline bronchodilator response, possible mild restriction based on a decreased residual volume.    Review of Systems As per HPI  Past Medical History:  Diagnosis Date  . Acid reflux   . Allergy   . Anemia   . Anxiety    in past  . Arthritis    all over  . Asthma   . Atypical moles   . Bronchitis   . Bronchospasm    reactive with perfume/cologne and smoke  . CAD (coronary artery disease)    patient denies on preop of 10/24   . Candida esophagitis ([place])   . Carotid artery calcification, bilateral 04/2016  . Cataract   . Cervical stenosis of spine   . Chronic insomnia   . Complication of anesthesia    shoulder surgery- not all the way asleep  . Depression    in past  . Diverticulitis   . Diverticulosis   . Dizziness   . Elevated liver enzymes   . Family history of adverse reaction  to anesthesia    sister- has problems with nausea and vomiting   . Fatigue   . Fatty liver   . Generalized headache    migraines  . GERD (gastroesophageal reflux disease)   . History of chemotherapy    topical cream for h/o skin CA  . History of kidney stones   . History of neuropathy    FH Dr. Posey Pronto neurology  . History of vertigo   . Hx of migraines   . Hyperlipidemia   . Hypothyroidism   . Insomnia   . Irritable bowel syndrome   . LLQ abdominal pain   . Melanoma ([place]) 2013  . Melanoma ([place])    x2 stg II melanoma  . Neuropathy, peripheral   . Peripheral neuropathy   . Pneumonia    hx of x 2  . PONV (postoperative nausea and vomiting)   . Pre-diabetes   . Retina disorder    left  . SCC (squamous cell carcinoma)    skin   . Stroke [facility])    ? TIA per Dr V per patient   . Thyroid disease   . Urine incontinence   . Vitamin D deficiency   . Weight gain      Family History  Problem Relation Age of Onset  . Hypertension Mother   .  Stroke Mother   . Hyperlipidemia Mother   . Heart disease Father   . Diabetes Father   . Hypertension Father   . Hyperlipidemia Father   . Early death Father   . Diabetes Sister   . Heart disease Sister        heart murmur  . Arthritis Sister   . COPD Sister   . Hyperlipidemia Sister   . Hypertension Sister   . Pancreatic cancer Maternal Uncle   . Alzheimer's disease Paternal Aunt   . Colon cancer Maternal Grandfather   . Cancer Maternal Grandfather   . Early death Maternal Grandfather   . Alzheimer's disease Paternal Aunt   . Alzheimer's disease Paternal Aunt   . Colon polyps Sister        adenomous  . Arthritis Sister   . Heart disease Sister   . Hyperlipidemia Sister   . Hypertension Sister   . Cancer Sister   . Heart disease Daughter   . Hyperlipidemia Daughter   . Hypertension Daughter   . Heart disease Son   . Hyperlipidemia Son   . Alcohol abuse Son   . Depression Son   . Diabetes Son   . Stroke Maternal  Grandmother   . Early death Paternal Grandmother   . Stroke Paternal Grandmother   . Cancer Other        2 paternal uncles and 1 maternal uncle colon cancer  . Alzheimer's disease Paternal Aunt      Social History   Socioeconomic History  . Marital status: Divorced    Spouse name: Not on file  . Number of children: 3  . Years of education: College  . Highest education level: Some college, no degree  Occupational History  . Occupation: Retired    Fish farm manager: RETIRED  Tobacco Use  . Smoking status: Never Smoker  . Smokeless tobacco: Never Used  Vaping Use  . Vaping Use: Never used  Substance and Sexual Activity  . Alcohol use: Yes    Alcohol/week: 1.0 standard drink    Types: 1 Cans of beer per week    Comment: once a week beer or wine  . Drug use: No  . Sexual activity: Not Currently  Other Topics Concern  . Not on file  Social History Narrative   Patient lives at home alone in a one story home.   Has 3 children (2 daughters and 1 son)    Retired from [employer]   Divorced since 1992    Caffeine Use: 1 cup daily   No guns    Wears seat belt    Lives with cat   Social Determinants of Health   Financial Resource Strain: Low Risk   . Difficulty of Paying Living Expenses: Not hard at all  Food Insecurity: No Food Insecurity  . Worried About Charity fundraiser in the Last Year: Never true  . Ran Out of Food in the Last Year: Never true  Transportation Needs: No Transportation Needs  . Lack of Transportation (Medical): No  . Lack of Transportation (Non-Medical): No  Physical Activity:   . Days of Exercise per Week: Not on file  . Minutes of Exercise per Session: Not on file  Stress: No Stress Concern Present  . Feeling of Stress : Only a little  Social Connections:   . Frequency of Communication with Friends and Family: Not on file  . Frequency of Social Gatherings with Friends and Family: Not on file  . Attends Religious  Services: Not on file  .  Active Member of Clubs or Organizations: Not on file  . Attends Archivist Meetings: Not on file  . Marital Status: Not on file  Intimate Partner Violence: Not At Risk  . Fear of Current or Ex-Partner: No  . Emotionally Abused: No  . Physically Abused: No  . Sexually Abused: No     Allergies  Allergen Reactions  . Epinephrine Other (See Comments) and Anaphylaxis    Heart palpitations  . Penicillins Swelling and Other (See Comments)    Lip swelling Has patient had a PCN reaction causing immediate rash, facial/tongue/throat swelling, SOB or lightheadedness with hypotension: Yes Has patient had a PCN reaction causing severe rash involving mucus membranes or skin necrosis: No Has patient had a PCN reaction that required hospitalization No Has patient had a PCN reaction occurring within the last 10 years: No If all of the above answers are "NO", then may proceed with Cephalosporin use.  . Lidocaine Hcl Other (See Comments)  . Other     Black pepper- caused bronchial tubes to start closing   . Valium [Diazepam] Other (See Comments)    Other reaction(s): Other (See Comments) Overly sensitive  sedation     Outpatient Medications Prior to Visit  Medication Sig Dispense Refill  . albuterol (PROAIR HFA) 108 (90 Base) MCG/ACT inhaler Inhale 1-2 puffs into the lungs every 6 (six) hours as needed for wheezing or shortness of breath. 18 g 11  . amLODipine (NORVASC) 5 MG tablet Take 1 tablet (5 mg total) by mouth daily. In am with food 90 tablet 3  . Ascorbic Acid (VITAMIN C) 1000 MG tablet Take 1,000 mg by mouth daily.    Marland Kitchen aspirin 81 MG tablet TAKE 1 TABLET BY MOUTH EVERY DAY    . atorvastatin (LIPITOR) 20 MG tablet Take 1 tablet (20 mg total) by mouth daily. 90 tablet 3  . Calcium 600-200 MG-UNIT tablet Take 1 tablet daily by mouth.    . Cholecalciferol (VITAMIN D3) 2000 UNITS capsule Take 2,000 Units by mouth daily.    . fluticasone furoate-vilanterol (BREO ELLIPTA) 100-25  MCG/INH AEPB Inhale 1 puff into the lungs daily. 28 each 12  . FOLIC ACID PO Take by mouth daily.    Marland Kitchen levothyroxine (SYNTHROID) 75 MCG tablet Take 1 tablet (75 mcg total) by mouth daily before breakfast. 90 tablet 3  . olmesartan (BENICAR) 20 MG tablet Take 0.5 tablets (10 mg total) by mouth daily. If BP>130/>80 take another 1/2 pill 45 tablet 3  . Omega-3 Fatty Acids (FISH OIL) 1000 MG CAPS Take 1,000 mg by mouth daily.     . sertraline (ZOLOFT) 25 MG tablet Take 1 tablet (25 mg total) by mouth daily. 90 tablet 1  . traMADol (ULTRAM) 50 MG tablet Take 1 tablet (50 mg total) by mouth every 6 (six) hours as needed. 15 tablet 0  . Magnesium 250 MG TABS Take 250 mg by mouth daily. (Patient not taking: Reported on 05/07/2020)    . omeprazole (PRILOSEC) 20 MG capsule Take 1 capsule (20 mg total) by mouth 2 (two) times daily before a meal. (Patient taking differently: Take 20 mg by mouth daily. ) 60 capsule 3  . vitamin B-12 (CYANOCOBALAMIN) 1000 MCG tablet Take 2,000 mcg daily by mouth. 2 tabs (Patient not taking: Reported on 05/07/2020)    . Zinc 50 MG CAPS Take 50 mg by mouth daily. (Patient not taking: Reported on 05/07/2020)     No facility-administered  medications prior to visit.        Objective:   Physical Exam Vitals:   05/07/20 1624  BP: 118/64  Pulse: 68  Temp: 98.5 F (36.9 C)  TempSrc: Temporal  SpO2: 97%  Weight: 191 lb 6.4 oz (86.8 kg)  Height: 5\' 6"  (1.676 m)   Gen: Pleasant, obese woman, in no distress,  normal affect, no distress  ENT: No lesions,  mouth clear,  oropharynx clear, no postnasal drip  Neck: No JVD, no stridor  Lungs: No use of accessory muscles, decreased at the bases, no crackles or wheezing on normal respiration, no wheeze on forced expiration  Cardiovascular: RRR, heart sounds normal, no murmur or gallops, no peripheral edema  Musculoskeletal: No deformities, no cyanosis or clubbing  Neuro: alert, awake, non focal  Skin: Warm, no lesions or  rash      Assessment & Plan:  Abnormal CT of the chest I reviewed her CT scans of the chest, showed them to her myself.  I agree with Dr. Patsey Berthold that [name] does not have interstitial lung disease, does not have pulmonary fibrosis.  [name] has some very mild biapical scar (almost undetectable) likely from her prior pneumonias.  It is not impacting her breathing.  I believe [name] understood and was reassured.  Dyspnea on exertion Her pulmonary function testing shows some restriction and also some subtle airflow limitation, possible mild asthma.  [name] is undertaking a trial of Breo now.  Unfortunately [name] does not feel that it helped her any.  I have asked her to finish it and then stop, keep track of whether [name] misses it when [name] is off of it.  [name] then needs to review her clinical response with Dr. Patsey Berthold.  Given the minimal obstruction on her PFT [name] may not get a lot of benefit. With reassuring PFT and absence of any response to bronchodilator, reassuring cardiac evaluation, I suspect that most of her dyspnea is due to deconditioning.  [name] does tell me that [name] stopped working out when [employer] started.  I think [name] should now feel empowered to try and start working again on her cardiopulmonary conditioning.  Discussed this with her today.  Baltazar Apo, MD, PhD 05/07/2020, 5:26 PM [facility] [phone] or if no answer [phone]

## 2020-05-07 NOTE — Assessment & Plan Note (Addendum)
Her pulmonary function testing shows some restriction and also some subtle airflow limitation, possible mild asthma.  She is undertaking a trial of Breo now.  Unfortunately she does not feel that it helped her any.  I have asked her to finish it and then stop, keep track of whether she misses it when she is off of it.  She then needs to review her clinical response with Dr. Patsey Berthold.  Given the minimal obstruction on her PFT she may not get a lot of benefit.  She does have some restriction due to her obesity and her hiatal hernia. With mostly reassuring PFT and absence of any response to bronchodilator, reassuring cardiac evaluation, I suspect that most of her dyspnea is due to deconditioning.  She does tell me that she stopped working out when [employer] started.  I think she should now feel empowered to try and start working again on her cardiopulmonary conditioning.  Discussed this with her today.

## 2020-05-07 NOTE — Assessment & Plan Note (Signed)
I reviewed her CT scans of the chest, showed them to her myself.  I agree with Dr. Patsey Berthold that Ms. [name] does not have interstitial lung disease, does not have pulmonary fibrosis.  She has some very mild biapical scar (almost undetectable) likely from her prior pneumonias.  It is not impacting her breathing.  I believe she understood and was reassured.

## 2020-05-07 NOTE — Patient Instructions (Signed)
We reviewed your CT scans of the chest today.  There is a very small amount of residual scar present due to prior pneumonias.  There is no evidence of interstitial lung disease, no evidence of pulmonary fibrosis.  This is good news.  There is not enough scar present to be influencing your breathing. Finish your [place] regimen.  Keep track of whether your breathing changes, whether you lose ground after you stop it so you can discuss this with [name]. Since your cardiac evaluation has been reassuring and your pulmonary function testing does not have any severe abnormalities, suspect that your shortness of breath is being influenced by some deconditioning.  You would benefit from slowly and steadily increasing your exercise to build back up your stamina. Follow with Dr [name] as planned.

## 2020-05-11 ENCOUNTER — Other Ambulatory Visit (INDEPENDENT_AMBULATORY_CARE_PROVIDER_SITE_OTHER): Payer: PPO

## 2020-05-11 ENCOUNTER — Other Ambulatory Visit: Payer: Self-pay | Admitting: Internal Medicine

## 2020-05-11 ENCOUNTER — Telehealth: Payer: Self-pay | Admitting: *Deleted

## 2020-05-11 ENCOUNTER — Other Ambulatory Visit: Payer: Self-pay

## 2020-05-11 DIAGNOSIS — K760 Fatty (change of) liver, not elsewhere classified: Secondary | ICD-10-CM

## 2020-05-11 DIAGNOSIS — R7989 Other specified abnormal findings of blood chemistry: Secondary | ICD-10-CM

## 2020-05-11 LAB — COMPREHENSIVE METABOLIC PANEL
ALT: 14 U/L (ref 0–35)
AST: 14 U/L (ref 0–37)
Albumin: 4.3 g/dL (ref 3.5–5.2)
Alkaline Phosphatase: 72 U/L (ref 39–117)
BUN: 21 mg/dL (ref 6–23)
CO2: 23 mEq/L (ref 19–32)
Calcium: 9.3 mg/dL (ref 8.4–10.5)
Chloride: 107 mEq/L (ref 96–112)
Creatinine, Ser: 0.99 mg/dL (ref 0.40–1.20)
GFR: 54.27 mL/min — ABNORMAL LOW (ref >60.00)
Glucose, Bld: 122 mg/dL — ABNORMAL HIGH (ref 70–99)
Potassium: 4.3 mEq/L (ref 3.5–5.1)
Sodium: 140 mEq/L (ref 135–145)
Total Bilirubin: 1.2 mg/dL (ref 0.2–1.2)
Total Protein: 6.2 g/dL (ref 6.0–8.3)

## 2020-05-11 NOTE — Telephone Encounter (Signed)
There are no messages in the Patient's chart for why she was scheduled for labs.   Noted

## 2020-05-11 NOTE — Telephone Encounter (Signed)
Thanks for covering today  I think pt called and made her own lab appt but shes had labs as of 03/2020 pretty comprehensive  Im not sure anyone instructed her to do so but ordered CMET to follow kidney function and lfts h/o HTN and fatty liver   Thanks [place]

## 2020-05-11 NOTE — Telephone Encounter (Signed)
Not sure why labs were scheduled and I didn't know of maybe  I did but please make sure I know in advance about visits for labs  She has a lot of labs 03/2020 so not really due but I ordered a CMET to follow her liver and kidneys    [name]

## 2020-05-11 NOTE — Telephone Encounter (Signed)
I always check my lab schedule days in advance as you are aware. This appt was added on 10/18 (after I checked it) by the front staff.

## 2020-05-11 NOTE — Telephone Encounter (Signed)
fyi  [facility] lab collected today

## 2020-05-11 NOTE — Telephone Encounter (Signed)
Please place future orders for lab appt. Pt coming in this morning for labs

## 2020-05-27 ENCOUNTER — Telehealth: Payer: Self-pay | Admitting: Internal Medicine

## 2020-05-27 DIAGNOSIS — R350 Frequency of micturition: Secondary | ICD-10-CM

## 2020-05-27 NOTE — Telephone Encounter (Signed)
Pt called and would like a call back about  her olmesartan (BENICAR) 20 MG tablet and levothyroxine (SYNTHROID) 75 MCG tablet and the dosage

## 2020-05-28 NOTE — Telephone Encounter (Signed)
Left message to return call 

## 2020-05-29 NOTE — Telephone Encounter (Signed)
Confirmed with the Patient that she is to take 75 mcg Levothyroxine. Although this has been discontinued the pharmacy continues to fill the 50 mcg. She will take 1.5 tablets to equal the 75 mcg.   Patient informed that she is on amlodipine 5 mg one tablet in the am. Informed she is to take 0.5 tablets of the Olmesartan in the am as well. She will then take her BP 2 hours later. If it is >130/>80 she will then take another 0.5 tablet of Olmesartan. Patient verbalized understanding  Patient's last labs mailed to her verified address.   Patient states she is constantly using the bathroom at night and this keeps her awake. She is drinking the suggested amount of water per her last labs. She was wondering if she could do anything about this?

## 2020-06-01 NOTE — Telephone Encounter (Signed)
Patient informed and verbalized understanding.  Scheduled for urine labs tomorrow 11/16

## 2020-06-01 NOTE — Telephone Encounter (Signed)
For your information  

## 2020-06-01 NOTE — Telephone Encounter (Signed)
Does she want to schedule urine culture, if so order and schedule  Limit fluid 2 hours before bed and urinate before bed   If this is not a UTI we have meds to treat overactive bladder  Sch telephone visit to discuss if needed

## 2020-06-01 NOTE — Addendum Note (Signed)
Addended by: Thressa Sheller on: 06/01/2020 02:43 PM   Modules accepted: Orders

## 2020-06-02 ENCOUNTER — Other Ambulatory Visit: Payer: Self-pay

## 2020-06-02 ENCOUNTER — Other Ambulatory Visit (INDEPENDENT_AMBULATORY_CARE_PROVIDER_SITE_OTHER): Payer: PPO

## 2020-06-02 DIAGNOSIS — R350 Frequency of micturition: Secondary | ICD-10-CM

## 2020-06-03 LAB — URINALYSIS
Bilirubin Urine: NEGATIVE
Glucose, UA: NEGATIVE
Hgb urine dipstick: NEGATIVE
Ketones, ur: NEGATIVE
Leukocytes,Ua: NEGATIVE
Nitrite: NEGATIVE
Protein, ur: NEGATIVE
Specific Gravity, Urine: 1.01 (ref 1.001–1.03)
pH: 6.5 (ref 5.0–8.0)

## 2020-06-03 LAB — URINE CULTURE

## 2020-06-04 DIAGNOSIS — R350 Frequency of micturition: Secondary | ICD-10-CM | POA: Diagnosis not present

## 2020-06-04 NOTE — Progress Notes (Signed)
Im sorry Dr. Aundra Dubin I called [facility] and somehow I messed up and did not see the culture lab so I do not submit the gray top. I still have her urine did you want me to culture it?

## 2020-06-04 NOTE — Addendum Note (Signed)
Addended by: Tor Netters I on: 06/04/2020 11:38 AM   Modules accepted: Orders

## 2020-06-05 LAB — URINE CULTURE
MICRO NUMBER:: 11220938
Result:: NO GROWTH
SPECIMEN QUALITY:: ADEQUATE

## 2020-06-05 NOTE — Progress Notes (Signed)
The sample was sent of yesterday. It should be resulting soon. I'm so sorry for the inconvenience

## 2020-06-16 DIAGNOSIS — L570 Actinic keratosis: Secondary | ICD-10-CM | POA: Diagnosis not present

## 2020-06-16 DIAGNOSIS — M7989 Other specified soft tissue disorders: Secondary | ICD-10-CM | POA: Diagnosis not present

## 2020-06-16 DIAGNOSIS — M79662 Pain in left lower leg: Secondary | ICD-10-CM | POA: Diagnosis not present

## 2020-06-17 ENCOUNTER — Other Ambulatory Visit: Payer: Self-pay | Admitting: Dermatology

## 2020-06-17 DIAGNOSIS — M79662 Pain in left lower leg: Secondary | ICD-10-CM

## 2020-06-17 DIAGNOSIS — M7989 Other specified soft tissue disorders: Secondary | ICD-10-CM

## 2020-06-30 ENCOUNTER — Other Ambulatory Visit: Payer: PPO

## 2020-07-01 ENCOUNTER — Telehealth: Payer: Self-pay | Admitting: Pulmonary Disease

## 2020-07-01 NOTE — Telephone Encounter (Signed)
Patient is returning phone call. Patient phone number is [phone].

## 2020-07-01 NOTE — Telephone Encounter (Signed)
Patient is aware of below recommendations and voiced her understanding, Appointment scheduled with Dr. Patsey Berthold for 08/05/2020 at 11:00. Nothing further needed.

## 2020-07-01 NOTE — Telephone Encounter (Signed)
Spoke to patient, who stated that her SOB has worsened since last office visit. Cough is baseline-occ prod with clear sputum.  Denied F/C/S or additional symptoms.  She does not feel that Memory Dance is effective.  Using ventolin HFA BID with no relief.  She has had both covid vaccines and flu shot.  Dr. Patsey Berthold, please advise. Thanks

## 2020-07-01 NOTE — Telephone Encounter (Signed)
Stop the Breo.  I am not convinced that her shortness of breath is due to her lungs.  I do recommend that she continue taking omeprazole make sure she is taking that she does have a hiatal hernia.  I am going to refer her to cardiology to ensure that her shortness of breath is not coming from issues with her heart.  She does have calcifications of her arteries on CT.

## 2020-07-01 NOTE — Telephone Encounter (Signed)
Lm for patient.  

## 2020-07-01 NOTE — Telephone Encounter (Signed)
Spoke to patient and relayed below recommendations.  Patient stated she seen Dr. Rockey Situ 01/2020 and was instructed to f/u in 1y. She is questioning if she should call for sooner appointment.  Dr. Patsey Berthold, please advise. Thanks

## 2020-07-01 NOTE — Telephone Encounter (Signed)
I recommend that she get an appointment with Dr. Rockey Situ sooner.  She has been evaluated by me and by Dr. Lamonte Sakai, she does not have severe scarring or fibrosis of the lungs.  She has very mild airways reactivity that should have responded to [facility].  I am more concerned of the calcium in her coronary arteries and she may need further evaluation of this issue.  She should also appointment to be seen as this cannot be sorted out over the phone.

## 2020-07-14 ENCOUNTER — Ambulatory Visit: Payer: PPO | Admitting: Internal Medicine

## 2020-07-17 NOTE — Progress Notes (Signed)
Cardiology Office Note:    Date:  07/20/2020   ID:  Leah MailHelen S Gnau, DOB 04/16/1941, MRN [id number]  PCP:  McLean-Scocuzza, Pasty Spillersracy N, MD  [facility] Cardiologist:  No primary care provider on file.  [facility] Electrophysiologist:  None   Referring MD: McLean-Scocuzza, French Anaracy *   Chief Complaint  Patient presents with   Other    Patient c/o SOB, Occ chest pain, swelling in ankles. Meds reviewed verbally with patient.     History of Present Illness:    [name] is a 79 y.o. Female with hx of  CAD and aortic athero on CT scan lightheadedness,  Mild carotid dz Hyperlipidemia morbid obesity Syncope S/p right TKR Melanoma Scarring in lung apices on CT Who presents for  coronary disease, edema, chronic stable angina, shortness of breath, pulmonary fibrosis  Last seen in clinic by myself July 2021 Chronic shortness of breath after walking short distances has to stop to catch her breath, weight continues to run high Used to go to the gym 2 years ago, through Covid has been sedentary  CT scan in July 2020   pulm scarring fibrosis in the apices Followed by pulmonary,  airway reactivity  Stress test August 2021, no ischemia  CT chest 01/2019 Aortic Atherosclerosis  Diffuse coronary artery calcifications, particularly dense within the left anterior descending coronary artery  Reports having chronic symptoms of shortness of breath  Echocardiogram performed January 07, 2020 Normal ejection fraction, normal right heart pressures, normal RV function Mild to moderately TR  Echo in 2017: normal EF, normal RVSP  EKG personally reviewed by myself on todays visit NSr rate 67 no ST or T wave changes  Other past medical reviewed CT ABD: mild A athero There is 3 vessel coronary calcification  2017 and 2018   Past Medical History:  Diagnosis Date   Acid reflux    Allergy    Anemia    Anxiety    in past   Arthritis    all over   Asthma    Atypical moles     Bronchitis    Bronchospasm    reactive with perfume/cologne and smoke   CAD (coronary artery disease)    patient denies on preop of 10/24    Candida esophagitis (HCC)    Carotid artery calcification, bilateral 04/2016   Cataract    Cervical stenosis of spine    Chronic insomnia    Complication of anesthesia    shoulder surgery- not all the way asleep   Depression    in past   Diverticulitis    Diverticulosis    Dizziness    Elevated liver enzymes    Family history of adverse reaction to anesthesia    sister- has problems with nausea and vomiting    Fatigue    Fatty liver    Generalized headache    migraines   GERD (gastroesophageal reflux disease)    History of chemotherapy    topical cream for h/o skin CA   History of kidney stones    History of neuropathy    FH Dr. Allena KatzPatel neurology   History of vertigo    Hx of migraines    Hyperlipidemia    Hypothyroidism    Insomnia    Irritable bowel syndrome    LLQ abdominal pain    Melanoma (HCC) 2013   Melanoma (HCC)    x2 stg II melanoma   Neuropathy, peripheral    Peripheral neuropathy    Pneumonia  hx of x 2   PONV (postoperative nausea and vomiting)    Pre-diabetes    Retina disorder    left   SCC (squamous cell carcinoma)    skin    Stroke (HCC)    ? TIA per Dr V per patient    Thyroid disease    Urine incontinence    Vitamin D deficiency    Weight gain     Past Surgical History:  Procedure Laterality Date   BILATERAL SALPINGOOPHORECTOMY     ovarian cyst Dr. Dareen Piano and Dr. Allie Dimmer 2008   BREAST SURGERY     in 2014 bx    BREAST SURGERY     augmentation with saline Dr. Karmen Bongo 1995    CHOLECYSTECTOMY N/A 01/28/2015   Procedure: LAPAROSCOPIC CHOLECYSTECTOMY WITH INTRAOPERATIVE CHOLANGIOGRAM;  Surgeon: Chevis Pretty III, MD;  Location: [facility] OR;  Service: General;  Laterality: N/A;   CLOSED MANIPULATION SHOULDER     rt   COLONOSCOPY WITH PROPOFOL N/A  11/20/2012   Procedure: COLONOSCOPY WITH PROPOFOL;  Surgeon: Charolett Bumpers, MD;  Location: [facility];  Service: Endoscopy;  Laterality: N/A;   colonscopy     May 2014   JOINT REPLACEMENT     RTK   KNEE ARTHROSCOPY     left knee miniscus tear repair     MELANOMA EXCISION WITH SENTINEL LYMPH NODE BIOPSY  03/18/11   Back, nodes neg   OOPHORECTOMY     ROTATOR CUFF REPAIR     left   SHOULDER SURGERY     right and left    SKIN CANCER EXCISION     multiple   TOTAL HIP ARTHROPLASTY     TOTAL KNEE ARTHROPLASTY     right 2010 and left in 2018    TOTAL KNEE ARTHROPLASTY Left 05/16/2017   Procedure: LEFT TOTAL KNEE ARTHROPLASTY;  Surgeon: Durene Romans, MD;  Location: [facility];  Service: Orthopedics;  Laterality: Left;  70 mins   TUBAL LIGATION     TUMOR REMOVAL     ovary    Current Medications: Current Meds  Medication Sig   albuterol (PROAIR HFA) 108 (90 Base) MCG/ACT inhaler Inhale 1-2 puffs into the lungs every 6 (six) hours as needed for wheezing or shortness of breath.   amLODipine (NORVASC) 5 MG tablet Take 1 tablet (5 mg total) by mouth daily. In am with food   Ascorbic Acid (VITAMIN C) 1000 MG tablet Take 1,000 mg by mouth daily.   aspirin 81 MG tablet TAKE 1 TABLET BY MOUTH EVERY DAY   atorvastatin (LIPITOR) 20 MG tablet Take 1 tablet (20 mg total) by mouth daily.   Calcium 600-200 MG-UNIT tablet Take 1 tablet daily by mouth.   Cholecalciferol (VITAMIN D3) 2000 UNITS capsule Take 2,000 Units by mouth daily.   FOLIC ACID PO Take by mouth daily.   levothyroxine (SYNTHROID) 75 MCG tablet Take 1 tablet (75 mcg total) by mouth daily before breakfast.   Magnesium 250 MG TABS Take 250 mg by mouth daily.   olmesartan (BENICAR) 20 MG tablet Take 0.5 tablets (10 mg total) by mouth daily. If BP>130/>80 take another 1/2 pill   Omega-3 Fatty Acids (FISH OIL) 1000 MG CAPS Take 1,000 mg by mouth daily.    omeprazole (PRILOSEC) 20 MG capsule Take 1 capsule (20 mg  total) by mouth 2 (two) times daily before a meal.   sertraline (ZOLOFT) 25 MG tablet Take 1 tablet (25 mg total) by mouth daily.   vitamin B-12 (CYANOCOBALAMIN)  1000 MCG tablet Take 2,000 mcg by mouth daily. 2 tabs   Zinc 50 MG CAPS Take 50 mg by mouth daily.     Allergies:   Epinephrine, Penicillins, Lidocaine hcl, Other, and Valium [diazepam]   Social History   Socioeconomic History   Marital status: Divorced    Spouse name: Not on file   Number of children: 3   Years of education: College   Highest education level: Some college, no degree  Occupational History   Occupation: Retired    Fish farm manager: RETIRED  Tobacco Use   Smoking status: Never Smoker   Smokeless tobacco: Never Used  Scientific laboratory technician Use: Never used  Substance and Sexual Activity   Alcohol use: Yes    Alcohol/week: 1.0 standard drink    Types: 1 Cans of beer per week    Comment: once a week beer or wine   Drug use: No   Sexual activity: Not Currently  Other Topics Concern   Not on file  Social History Narrative   Patient lives at home alone in a one story home.   Has 3 children (2 daughters and 1 son)    Retired from [employer]   Divorced since 1992    Caffeine Use: 1 cup daily   No guns    Wears seat belt    Lives with cat   Social Determinants of Health   Financial Resource Strain: Low Risk    Difficulty of Paying Living Expenses: Not hard at all  Food Insecurity: No Food Insecurity   Worried About Charity fundraiser in the Last Year: Never true   Arboriculturist in the Last Year: Never true  Transportation Needs: No Transportation Needs   Lack of Transportation (Medical): No   Lack of Transportation (Non-Medical): No  Physical Activity: Not on file  Stress: No Stress Concern Present   Feeling of Stress : Only a little  Social Connections: Not on file     Family History: The patient's family history includes Alcohol abuse in her son; Alzheimer's  disease in her paternal aunt, paternal aunt, paternal aunt, and paternal aunt; Arthritis in her sister and sister; COPD in her sister; Cancer in her maternal grandfather, sister, and another family member; Colon cancer in her maternal grandfather; Colon polyps in her sister; Depression in her son; Diabetes in her father, sister, and son; Early death in her father, maternal grandfather, and paternal grandmother; Heart disease in her daughter, father, sister, sister, and son; Hyperlipidemia in her daughter, father, mother, sister, sister, and son; Hypertension in her daughter, father, mother, sister, and sister; Pancreatic cancer in her maternal uncle; Stroke in her maternal grandmother, mother, and paternal grandmother.  ROS:   Please see the history of present illness.    Review of Systems  Constitutional: Negative.   HENT: Negative.   Respiratory: Positive for shortness of breath.   Cardiovascular: Negative.   Gastrointestinal: Negative.   Musculoskeletal: Negative.   Neurological: Negative.   Psychiatric/Behavioral: Negative.   All other systems reviewed and are negative.   EKGs/Labs/Other Studies Reviewed:    The following studies were reviewed today:  Echo 12/2019 1. Left ventricular ejection fraction, by estimation, is 60 to 65%. The  left ventricle has normal function. The left ventricle has no regional  wall motion abnormalities. Left ventricular diastolic parameters are  consistent with age-related delayed  relaxation (normal).  2. Right ventricular systolic function is normal. The right ventricular  size is normal.  There is normal pulmonary artery systolic pressure.  3. The mitral valve is degenerative. Mild mitral valve regurgitation. No  evidence of mitral stenosis.  4. Tricuspid valve regurgitation is mild to moderate.  5. The aortic valve is tricuspid. Aortic valve regurgitation is not  visualized. No aortic stenosis is present.  6. The inferior vena cava is normal  in size with greater than 50%  respiratory variability, suggesting right atrial pressure of 3 mmHg.   EKG:  EKG is  ordered today.  The ekg ordered today demonstrates normal sinus rhythm rate 59 bpm no significant ST-T wave changes  Recent Labs: 04/14/2020: Hemoglobin 13.6; Platelets 197.0; TSH 3.22 05/11/2020: ALT 14; BUN 21; Creatinine, Ser 0.99; Potassium 4.3; Sodium 140  Recent Lipid Panel    Component Value Date/Time   CHOL 142 03/25/2020 0830   TRIG 99.0 03/25/2020 0830   HDL 69.80 03/25/2020 0830   CHOLHDL 2 03/25/2020 0830   VLDL 19.8 03/25/2020 0830   LDLCALC 52 03/25/2020 0830       Physical Exam:    VS:  BP 130/62 (BP Location: Right Arm, Patient Position: Sitting, Cuff Size: Normal)    Pulse (!) 59    Ht 5\' 6"  (1.676 m)    Wt 192 lb (87.1 kg)    SpO2 97%    BMI 30.99 kg/m     Wt Readings from Last 3 Encounters:  07/20/20 192 lb (87.1 kg)  05/07/20 191 lb 6.4 oz (86.8 kg)  05/03/20 185 lb (83.9 kg)     Constitutional:  oriented to person, place, and time. No distress.  HENT:  Head: Grossly normal Eyes:  no discharge. No scleral icterus.  Neck: No JVD, no carotid bruits  Cardiovascular: Regular rate and rhythm, no murmurs appreciated Pulmonary/Chest: Clear to auscultation bilaterally,  Scattered Rales Abdominal: Soft.  no distension.  no tenderness.  Musculoskeletal: Normal range of motion Neurological:  normal muscle tone. Coordination normal. No atrophy Skin: Skin warm and dry Psychiatric: normal affect, pleasant  ASSESSMENT:    1. Coronary artery disease of native artery of native heart with stable angina pectoris ([place])   2. Dyspnea on exertion   3. Scarring of lung   4. Morbid obesity (HCC)    PLAN:    Recent stress test with no significant ischemia Echocardiogram normal ejection fraction Continued shortness of breath on exertion, recommended she try to enroll in the cardiac rehab program/lung works 2 years ago was working out of the gym  several days a week, currently unable to walk very far secondary to shortness of breath, tightness Stressed importance that she needs to rehabilitate.  She is in agreement, desperate for help  Morbid obesity We have encouraged continued exercise, careful diet management in an effort to lose weight.  Scarring of lung/shortness of breath Followed by pulmonary, Suspect that lung work/cardiac rehab will be of tremendous benefit   Total encounter time more than 35 minutes  Greater than 50% was spent in counseling and coordination of care with the patient    Signed, Ida Rogue, MD  07/20/2020 3:27 PM    [place]

## 2020-07-20 ENCOUNTER — Ambulatory Visit: Payer: PPO | Admitting: Cardiovascular Disease

## 2020-07-20 ENCOUNTER — Encounter: Payer: Self-pay | Admitting: Cardiovascular Disease

## 2020-07-20 ENCOUNTER — Other Ambulatory Visit: Payer: Self-pay

## 2020-07-20 VITALS — BP 130/62 | HR 59 | Ht 66.0 in | Wt 192.0 lb

## 2020-07-20 DIAGNOSIS — R0600 Dyspnea, unspecified: Secondary | ICD-10-CM | POA: Diagnosis not present

## 2020-07-20 DIAGNOSIS — J984 Other disorders of lung: Secondary | ICD-10-CM | POA: Diagnosis not present

## 2020-07-20 DIAGNOSIS — I25118 Atherosclerotic heart disease of native coronary artery with other forms of angina pectoris: Secondary | ICD-10-CM

## 2020-07-20 DIAGNOSIS — I739 Peripheral vascular disease, unspecified: Secondary | ICD-10-CM | POA: Diagnosis not present

## 2020-07-20 DIAGNOSIS — I2510 Atherosclerotic heart disease of native coronary artery without angina pectoris: Secondary | ICD-10-CM | POA: Insufficient documentation

## 2020-07-20 DIAGNOSIS — R0609 Other forms of dyspnea: Secondary | ICD-10-CM

## 2020-07-20 NOTE — Patient Instructions (Addendum)
We will place a referral to [facility]/cardiac rehab   Medication Instructions:  No changes  If you need a refill on your cardiac medications before your next appointment, please call your pharmacy.    Lab work: No new labs needed   If you have labs (blood work) drawn today and your tests are completely normal, you will receive your results only by: Marland Kitchen MyChart Message (if you have MyChart) OR . A paper copy in the mail If you have any lab test that is abnormal or we need to change your treatment, we will call you to review the results.   Testing/Procedures: No new testing needed   Follow-Up: At [facility], you and your health needs are our priority.  As part of our continuing mission to provide you with exceptional heart care, we have created designated Provider Care Teams.  These Care Teams include your primary Cardiologist (physician) and Advanced Practice Providers (APPs -  Physician Assistants and Nurse Practitioners) who all work together to provide you with the care you need, when you need it.  . You will need a follow up appointment in 12 months  . Providers on your designated Care Team:   . Nicolasa Ducking, NP . Eula Listen, PA-C . Marisue Ivan, PA-C  Any Other Special Instructions Will Be Listed Below (If Applicable).  COVID-19 Vaccine Information can be found at: [email or web address] For questions related to vaccine distribution or appointments, please email [email or web address] or call [phone].

## 2020-07-21 ENCOUNTER — Other Ambulatory Visit: Payer: Self-pay | Admitting: *Deleted

## 2020-07-21 ENCOUNTER — Other Ambulatory Visit: Payer: Self-pay | Admitting: Internal Medicine

## 2020-07-21 DIAGNOSIS — R0600 Dyspnea, unspecified: Secondary | ICD-10-CM

## 2020-07-21 DIAGNOSIS — E039 Hypothyroidism, unspecified: Secondary | ICD-10-CM

## 2020-07-21 DIAGNOSIS — R0609 Other forms of dyspnea: Secondary | ICD-10-CM

## 2020-07-21 MED ORDER — LEVOTHYROXINE SODIUM 75 MCG PO TABS: 75.0000 ug | ORAL_TABLET | Freq: Every day | ORAL | 3 refills | Status: DC

## 2020-08-05 ENCOUNTER — Ambulatory Visit: Payer: PPO | Admitting: Pulmonary Disease

## 2020-08-05 ENCOUNTER — Telehealth: Payer: Self-pay | Admitting: Internal Medicine

## 2020-08-05 NOTE — Telephone Encounter (Signed)
Patient was giving COVID testing number

## 2020-08-05 NOTE — Telephone Encounter (Signed)
Patient called in stated that she would like to be tested for covid she has cough and congestion

## 2020-08-05 NOTE — Telephone Encounter (Signed)
Pt called to follow up on previous message. Wanted to get a covid test schedule  Tried to schedule a VV. Pt just wants to have the test done

## 2020-08-06 ENCOUNTER — Other Ambulatory Visit: Payer: Self-pay

## 2020-08-06 ENCOUNTER — Other Ambulatory Visit: Payer: PPO

## 2020-08-06 DIAGNOSIS — Z20822 Contact with and (suspected) exposure to covid-19: Secondary | ICD-10-CM | POA: Diagnosis not present

## 2020-08-08 LAB — NOVEL CORONAVIRUS, NAA: SARS-CoV-2, NAA: NOT DETECTED

## 2020-08-08 LAB — SARS-COV-2, NAA 2 DAY TAT

## 2020-08-10 ENCOUNTER — Encounter: Payer: PPO | Attending: Cardiovascular Disease

## 2020-08-10 ENCOUNTER — Other Ambulatory Visit: Payer: Self-pay

## 2020-08-10 ENCOUNTER — Ambulatory Visit (INDEPENDENT_AMBULATORY_CARE_PROVIDER_SITE_OTHER): Payer: PPO

## 2020-08-10 VITALS — BP 112/70 | HR 60 | Ht 66.0 in | Wt 192.0 lb

## 2020-08-10 DIAGNOSIS — R0600 Dyspnea, unspecified: Secondary | ICD-10-CM | POA: Insufficient documentation

## 2020-08-10 DIAGNOSIS — Z0000 Encounter for general adult medical examination without abnormal findings: Secondary | ICD-10-CM

## 2020-08-10 DIAGNOSIS — R0609 Other forms of dyspnea: Secondary | ICD-10-CM

## 2020-08-10 NOTE — Progress Notes (Addendum)
Subjective:   [name] is a 80 y.o. female who presents for Medicare Annual (Subsequent) preventive examination.  Review of Systems    No ROS.  Medicare Wellness Virtual Visit.   Cardiac Risk Factors include: advanced age (>69men, >68 women);hypertension     Objective:    Today's Vitals   08/10/20 1031  BP: 112/70  Pulse: 60  SpO2: 97%  Weight: 192 lb (87.1 kg)  Height: 5\' 6"  (1.676 m)   Body mass index is 30.99 kg/m.  Advanced Directives 08/10/2020 05/03/2020 08/08/2019 05/31/2019 10/02/2018 05/24/2017 05/16/2017  Does Patient Have a Medical Advance Directive? Yes No Yes Yes Yes Yes Yes  Type of Paramedic of [place];Living will - [place];Living will [place];Living will [place];Living will;Out of facility DNR (pink MOST or yellow form) - [place];Living will  Does patient want to make changes to medical advance directive? No - Patient declined - - - - No - Patient declined No - Patient declined  Copy of [place] in Chart? Yes - validated most recent copy scanned in chart (See row information) - No - copy requested No - copy requested - - No - copy requested  Would patient like information on creating a medical advance directive? - - - - - - -  Pre-existing out of facility DNR order (yellow form or pink MOST form) - - - - - - -    Current Medications (verified) Outpatient Encounter Medications as of 08/10/2020  Medication Sig   albuterol (PROAIR HFA) 108 (90 Base) MCG/ACT inhaler Inhale 1-2 puffs into the lungs every 6 (six) hours as needed for wheezing or shortness of breath.   amLODipine (NORVASC) 5 MG tablet Take 1 tablet (5 mg total) by mouth daily. In am with food   Ascorbic Acid (VITAMIN C) 1000 MG tablet Take 1,000 mg by mouth daily.   aspirin 81 MG tablet TAKE 1 TABLET BY MOUTH EVERY DAY   atorvastatin (LIPITOR) 20 MG tablet Take 1 tablet  (20 mg total) by mouth daily.   Calcium 600-200 MG-UNIT tablet Take 1 tablet daily by mouth.   Cholecalciferol (VITAMIN D3) 2000 UNITS capsule Take 2,000 Units by mouth daily.   FOLIC ACID PO Take by mouth daily.   levothyroxine (SYNTHROID) 75 MCG tablet Take 1 tablet (75 mcg total) by mouth daily before breakfast.   Magnesium 250 MG TABS Take 250 mg by mouth daily.   olmesartan (BENICAR) 20 MG tablet Take 0.5 tablets (10 mg total) by mouth daily. If BP>130/>80 take another 1/2 pill   Omega-3 Fatty Acids (FISH OIL) 1000 MG CAPS Take 1,000 mg by mouth daily.    omeprazole (PRILOSEC) 20 MG capsule Take 1 capsule (20 mg total) by mouth 2 (two) times daily before a meal.   sertraline (ZOLOFT) 25 MG tablet Take 1 tablet (25 mg total) by mouth daily.   vitamin B-12 (CYANOCOBALAMIN) 1000 MCG tablet Take 2,000 mcg by mouth daily. 2 tabs   Zinc 50 MG CAPS Take 50 mg by mouth daily.   No facility-administered encounter medications on file as of 08/10/2020.    Allergies (verified) Epinephrine, Penicillins, Lidocaine hcl, Other, and Valium [diazepam]   History: Past Medical History:  Diagnosis Date   Acid reflux    Allergy    Anemia    Anxiety    in past   Arthritis    all over   Asthma  Atypical moles    Bronchitis    Bronchospasm    reactive with perfume/cologne and smoke   CAD (coronary artery disease)    patient denies on preop of 10/24    Candida esophagitis (HCC)    Carotid artery calcification, bilateral 04/2016   Cataract    Cervical stenosis of spine    Chronic insomnia    Complication of anesthesia    shoulder surgery- not all the way asleep   Depression    in past   Diverticulitis    Diverticulosis    Dizziness    Elevated liver enzymes    Family history of adverse reaction to anesthesia    sister- has problems with nausea and vomiting    Fatigue    Fatty liver    Generalized headache    migraines   GERD (gastroesophageal reflux disease)    History of  chemotherapy    topical cream for h/o skin CA   History of kidney stones    History of neuropathy    FH Dr. Posey Pronto neurology   History of vertigo    Hx of migraines    Hyperlipidemia    Hypothyroidism    Insomnia    Irritable bowel syndrome    LLQ abdominal pain    Melanoma ([place]) 2013   Melanoma ([place])    x2 stg II melanoma   Neuropathy, peripheral    Peripheral neuropathy    Pneumonia    hx of x 2   PONV (postoperative nausea and vomiting)    Pre-diabetes    Retina disorder    left   SCC (squamous cell carcinoma)    skin    Stroke ([place])    ? TIA per Dr V per patient    Thyroid disease    Urine incontinence    Vitamin D deficiency    Weight gain    Past Surgical History:  Procedure Laterality Date   BILATERAL SALPINGOOPHORECTOMY     ovarian cyst Dr. Ouida Sills and Dr. Alisia Ferrari 2008   BREAST SURGERY     in 2014 bx    BREAST SURGERY     augmentation with saline Dr. Hubbard Hartshorn 1995    CHOLECYSTECTOMY N/A 01/28/2015   Procedure: LAPAROSCOPIC CHOLECYSTECTOMY WITH INTRAOPERATIVE CHOLANGIOGRAM;  Surgeon: Autumn Messing III, MD;  Location: [facility];  Service: General;  Laterality: N/A;   CLOSED MANIPULATION SHOULDER     rt   COLONOSCOPY WITH PROPOFOL N/A 11/20/2012   Procedure: COLONOSCOPY WITH PROPOFOL;  Surgeon: Garlan Fair, MD;  Location: [facility];  Service: Endoscopy;  Laterality: N/A;   colonscopy     May 2014   JOINT REPLACEMENT     RTK   KNEE ARTHROSCOPY     left knee miniscus tear repair     MELANOMA EXCISION WITH SENTINEL LYMPH NODE BIOPSY  03/18/11   Back, nodes neg   OOPHORECTOMY     ROTATOR CUFF REPAIR     left   SHOULDER SURGERY     right and left    SKIN CANCER EXCISION     multiple   TOTAL HIP ARTHROPLASTY     TOTAL KNEE ARTHROPLASTY     right 2010 and left in 2018    [place] Left 05/16/2017   Procedure: LEFT TOTAL KNEE ARTHROPLASTY;  Surgeon: Paralee Cancel, MD;  Location: [facility];  Service: Orthopedics;  Laterality: Left;  70 mins    TUBAL LIGATION     TUMOR REMOVAL     ovary  Family History  Problem Relation Age of Onset   Hypertension Mother    Stroke Mother    Hyperlipidemia Mother    Heart disease Father    Diabetes Father    Hypertension Father    Hyperlipidemia Father    Early death Father    Diabetes Sister    Heart disease Sister        heart murmur   Arthritis Sister    COPD Sister    Hyperlipidemia Sister    Hypertension Sister    Pancreatic cancer Maternal Uncle    Alzheimer's disease Paternal Aunt    Colon cancer Maternal Grandfather    Cancer Maternal Grandfather    Early death Maternal Grandfather    Alzheimer's disease Paternal Aunt    Alzheimer's disease Paternal Aunt    Colon polyps Sister        adenomous   Arthritis Sister    Heart disease Sister    Hyperlipidemia Sister    Hypertension Sister    Cancer Sister    Heart disease Daughter    Hyperlipidemia Daughter    Hypertension Daughter    Heart disease Son    Hyperlipidemia Son    Alcohol abuse Son    Depression Son    Diabetes Son    Stroke Maternal Grandmother    Early death Paternal Grandmother    Stroke Paternal Grandmother    Cancer Other        2 paternal uncles and 1 maternal uncle colon cancer   Alzheimer's disease Paternal Aunt    Social History   Socioeconomic History   Marital status: Divorced    Spouse name: Not on file   Number of children: 3   Years of education: College   Highest education level: Some college, no degree  Occupational History   Occupation: Retired    Fish farm manager: RETIRED  Tobacco Use   Smoking status: Never Smoker   Smokeless tobacco: Never Used  Scientific laboratory technician Use: Never used  Substance and Sexual Activity   Alcohol use: Yes    Alcohol/week: 1.0 standard drink    Types: 1 Cans of beer per week    Comment: once a week beer or wine   Drug use: No   Sexual activity: Not Currently  Other Topics Concern   Not on file  Social History Narrative   Patient lives at home alone  in a one story home.   Has 3 children (2 daughters and 1 son)    Retired from [employer]   Divorced since 1992    Caffeine Use: 1 cup daily   No guns    Wears seat belt    Lives with cat   Social Determinants of Health   Financial Resource Strain: Not on file  Food Insecurity: Not on file  Transportation Needs: Not on file  Physical Activity: Not on file  Stress: Not on file  Social Connections: Not on file    Tobacco Counseling Counseling given: Not Answered   Clinical Intake:  Pre-visit preparation completed: Yes        Diabetes: No  How often do you need to have someone help you when you read instructions, pamphlets, or other written materials from your doctor or pharmacy?: 1 - Never    Interpreter Needed?: No      Activities of Daily Living In your present state of health, do you have any difficulty performing the following activities: 08/10/2020  Hearing? N  Vision? N  Difficulty concentrating or making decisions? N  Walking or climbing stairs? Y  Comment Notes intermittent dizziness and SOB. Paces self and followed by pcp.  Dressing or bathing? N  Doing errands, shopping? N  Preparing Food and eating ? N  Using the Toilet? N  In the past six months, have you accidently leaked urine? Y  Comment Managed with daily brief  Do you have problems with loss of bowel control? N  Managing your Medications? N  Managing your Finances? N  Housekeeping or managing your Housekeeping? Y  Comment Maid assist  Some recent data might be hidden    Patient Care Team: McLean-Scocuzza, Nino Glow, MD as PCP - General (Internal Medicine) Patient, No Pcp Per (General Practice)  Indicate any recent Medical Services you may have received from other than [facility] providers in the past year (date may be approximate).     Assessment:   This is a routine wellness examination for [place].  I connected with Gwenivere today by telephone and verified that I am speaking  with the correct person using two identifiers. Location patient: home Location provider: work Persons participating in the virtual visit: patient, Marine scientist.    I discussed the limitations, risks, security and privacy concerns of performing an evaluation and management service by telephone and the availability of in person appointments. The patient expressed understanding and verbally consented to this telephonic visit.    Interactive audio and video telecommunications were attempted between this provider and patient, however failed, due to patient having technical difficulties OR patient did not have access to video capability.  We continued and completed visit with audio only.  Some vital signs may be absent or patient reported.   Hearing/Vision screen  Hearing Screening   125Hz  250Hz  500Hz  1000Hz  2000Hz  3000Hz  4000Hz  6000Hz  8000Hz   Right ear:           Left ear:           Comments: Patient is able to hear conversational tones without difficulty. No issues reported.   Vision Screening Comments: Visual acuity not assessed, virtual visit. They have seen their ophthalmologist in the last 12 months.   Dietary issues and exercise activities discussed: Current Exercise Habits: Home exercise routine, Intensity: Mild  Low sodium Good water intake  Goals       Patient Stated     I want to walk more for exercise (pt-stated)      Increase physical activity       Depression Screen PHQ 2/9 Scores 08/10/2020 02/28/2020 11/01/2019 08/08/2019 02/19/2019 09/07/2015  PHQ - 2 Score 1 0 0 1 4 -  PHQ- 9 Score - 0 - - 8 -  Exception Documentation - - - - - Patient refusal    Fall Risk Fall Risk  08/10/2020 02/28/2020 11/01/2019 08/08/2019 02/19/2019  Falls in the past year? 0 0 0 0 0  Number falls in past yr: 0 0 0 - 0  Injury with Fall? 0 0 0 - -  Follow up Falls evaluation completed Falls evaluation completed Falls evaluation completed Falls evaluation completed -    FALL RISK PREVENTION PERTAINING TO  THE HOME: Handrails in use when climbing stairs? Yes Home free of loose throw rugs in walkways, pet beds, electrical cords, etc? Yes  Adequate lighting in your home to reduce risk of falls? Yes   ASSISTIVE DEVICES UTILIZED TO PREVENT FALLS: Life alert? No  Use of a cane, walker or w/c? Yes , as needed. Grab bars in the bathroom? Yes  Shower chair or bench in shower? Yes  Elevated toilet seat or a handicapped toilet? Yes   TIMED UP AND GO: Was the test performed? No . Virtual visit.   Cognitive Function: MMSE - Mini Mental State Exam 10/31/2016 04/27/2016  Not completed: Refused Unable to complete   [facility] Cognitive Assessment  09/22/2017 12/17/2015 12/17/2015  Visuospatial/ Executive (0/5) 4 4 4   Naming (0/3) 3 3 3   Attention: Read list of digits (0/2) 2 2 2   Attention: Read list of letters (0/1) 1 1 1   Attention: Serial 7 subtraction starting at 100 (0/3) 2 3 3   Language: Repeat phrase (0/2) 2 2 2   Language : Fluency (0/1) 0 1 1  Abstraction (0/2) 2 2 2   Delayed Recall (0/5) 4 3 3   Orientation (0/6) 6 6 6   Total 26 27 27   Adjusted Score (based on education) 26 - 27   6CIT Screen 08/10/2020 08/08/2019  What Year? 0 points 0 points  What month? 0 points 0 points  What time? 0 points 0 points  Count back from 20 0 points 0 points  Months in reverse 0 points 0 points  Repeat phrase 0 points 0 points  Total Score 0 0    Immunizations Immunization History  Administered Date(s) Administered   Fluad Quad(high Dose 65+) 04/25/2019, 04/14/2020   Influenza Split 04/18/2017   Influenza, High Dose Seasonal PF 04/18/2017, 04/12/2018   Influenza-Unspecified 05/03/2018   PFIZER(Purple Top)SARS-COV-2 Vaccination 08/12/2019, 09/02/2019, 03/16/2020   Pneumococcal Conjugate-13 05/15/2014   Pneumococcal Polysaccharide-23 12/16/2005, 05/18/2016   Td 10/18/2012   Zoster Recombinat (Shingrix) 06/16/2011   Health Maintenance Health Maintenance  Topic Date Due   COVID-19 Vaccine (4 -  Booster for [place] series) 09/14/2020   TETANUS/TDAP  10/19/2022   INFLUENZA VACCINE  Completed   DEXA SCAN  Completed   Hepatitis C Screening  Completed   PNA vac Low Risk Adult  Completed   Colonoscopy- Followed by Gertie Fey and Pcp. Notes advised to have future colonoscopy by Endo. Declined by patient.   Mammogram status: Completed 01/21/20. Repeat every year. Negative benign [name].  Bone Density status: Completed 06/28/16. Results reflect: Bone density results: NORMAL. Repeat every 5 years. Calcium 600-200 MG-UNIT tablet. Cholecalciferol (VITAMIN D3) 2000 UNITS capsule.  Lung Cancer Screening: (Low Dose CT Chest recommended if Age 67-80 years, 30 pack-year currently smoking OR have quit w/in 15years.) does not qualify.   Hepatitis C Screening: Completed 12/05/18.  Vision Screening: Recommended annual ophthalmology exams for early detection of glaucoma and other disorders of the eye. Is the patient up to date with their annual eye exam?  Yes  Who is the provider or what is the name of the office in which the patient attends annual eye exams? [facility], Dr. Horald Chestnut.  Dental Screening: Recommended annual dental exams for proper oral hygiene. Visits every 6 months.   Community Resource Referral / Chronic Care Management: CRR required this visit?  No   CCM required this visit?  No      Plan:   Keep all routine maintenance appointments.   Follow up 09/01/20 @ 10:30  I have personally reviewed and noted the following in the patient's chart:   Medical and social history Use of alcohol, tobacco or illicit drugs  Current medications and supplements Functional ability and status Nutritional status Physical activity Advanced directives List of other physicians Hospitalizations, surgeries, and ER visits in previous 12 months Vitals Screenings to include cognitive, depression, and falls Referrals and appointments  In  addition, I have reviewed and discussed with  patient certain preventive protocols, quality metrics, and best practice recommendations. A written personalized care plan for preventive services as well as general preventive health recommendations were provided to patient via mychart.     OBrien-Blaney, Adalae Baysinger L, LPN   [id number]     I have reviewed the above information and agree with above.   Deborra Medina, MD

## 2020-08-10 NOTE — Progress Notes (Signed)
Virtual Visit completed. Patient informed on EP and RD appointment and 6 Minute walk test. Patient also informed of patient health questionnaires on My Chart. Patient Verbalizes understanding. Visit diagnosis can be found in [facility] 07/21/2020.

## 2020-08-10 NOTE — Patient Instructions (Addendum)
[name] , Thank you for taking time to come for your Medicare Wellness Visit. I appreciate your ongoing commitment to your health goals. Please review the following plan we discussed and let me know if I can assist you in the future.   These are the goals we discussed: Goals      Patient Stated   .  I want to walk more for exercise (pt-stated)      Increase physical activity       This is a list of the screening recommended for you and due dates:  Health Maintenance  Topic Date Due  . COVID-19 Vaccine (4 - Booster for [place] series) 09/14/2020  . Tetanus Vaccine  10/19/2022  . Flu Shot  Completed  . DEXA scan (bone density measurement)  Completed  .  Hepatitis C: One time screening is recommended by Center for Disease Control  (CDC) for  adults born from [birth year] through 1965.   Completed  . Pneumonia vaccines  Completed    Immunizations Immunization History  Administered Date(s) Administered  . Fluad Quad(high Dose 65+) 04/25/2019, 04/14/2020  . Influenza Split 04/18/2017  . Influenza, High Dose Seasonal PF 04/18/2017, 04/12/2018  . Influenza-Unspecified 05/03/2018  . PFIZER(Purple Top)SARS-COV-2 Vaccination 08/12/2019, 09/02/2019, 03/16/2020  . Pneumococcal Conjugate-13 05/15/2014  . Pneumococcal Polysaccharide-23 12/16/2005, 05/18/2016  . Td 10/18/2012  . Zoster Recombinat (Shingrix) 06/16/2011   Keep all routine maintenance appointments.   Follow up 09/01/20 @ 10:30  Advanced directives: on file  Conditions/risks identified: none new.   Follow up in one year for your annual wellness visit.   Preventive Care 65 Years and Older, Female Preventive care refers to lifestyle choices and visits with your health care provider that can promote health and wellness. What does preventive care include?  A yearly physical exam. This is also called an annual well check.  Dental exams once or twice a year.  Routine eye exams. Ask your health care provider how often you  should have your eyes checked.  Personal lifestyle choices, including:  Daily care of your teeth and gums.  Regular physical activity.  Eating a healthy diet.  Avoiding tobacco and drug use.  Limiting alcohol use.  Practicing safe sex.  Taking low-dose aspirin every day.  Taking vitamin and mineral supplements as recommended by your health care provider. What happens during an annual well check? The services and screenings done by your health care provider during your annual well check will depend on your age, overall health, lifestyle risk factors, and family history of disease. Counseling  Your health care provider may ask you questions about your:  Alcohol use.  Tobacco use.  Drug use.  Emotional well-being.  Home and relationship well-being.  Sexual activity.  Eating habits.  History of falls.  Memory and ability to understand (cognition).  Work and work Statistician.  Reproductive health. Screening  You may have the following tests or measurements:  Height, weight, and BMI.  Blood pressure.  Lipid and cholesterol levels. These may be checked every 5 years, or more frequently if you are over 4 years old.  Skin check.  Lung cancer screening. You may have this screening every year starting at age 79 if you have a 30-pack-year history of smoking and currently smoke or have quit within the past 15 years.  Fecal occult blood test (FOBT) of the stool. You may have this test every year starting at age 50.  Flexible sigmoidoscopy or colonoscopy. You may have a sigmoidoscopy every 5 years  or a colonoscopy every 10 years starting at age 54.  Hepatitis C blood test.  Hepatitis B blood test.  Sexually transmitted disease (STD) testing.  Diabetes screening. This is done by checking your blood sugar (glucose) after you have not eaten for a while (fasting). You may have this done every 1-3 years.  Bone density scan. This is done to screen for osteoporosis.  You may have this done starting at age 48.  Mammogram. This may be done every 1-2 years. Talk to your health care provider about how often you should have regular mammograms. Talk with your health care provider about your test results, treatment options, and if necessary, the need for more tests. Vaccines  Your health care provider may recommend certain vaccines, such as:  Influenza vaccine. This is recommended every year.  Tetanus, diphtheria, and acellular pertussis (Tdap, Td) vaccine. You may need a Td booster every 10 years.  Zoster vaccine. You may need this after age 58.  Pneumococcal 13-valent conjugate (PCV13) vaccine. One dose is recommended after age 59.  Pneumococcal polysaccharide (PPSV23) vaccine. One dose is recommended after age 47. Talk to your health care provider about which screenings and vaccines you need and how often you need them. This information is not intended to replace advice given to you by your health care provider. Make sure you discuss any questions you have with your health care provider. Document Released: 07/31/2015 Document Revised: 03/23/2016 Document Reviewed: 05/05/2015 Elsevier Interactive Patient Education  2017 [employer] Prevention in the Home Falls can cause injuries. They can happen to people of all ages. There are many things you can do to make your home safe and to help prevent falls. What can I do on the outside of my home?  Regularly fix the edges of walkways and driveways and fix any cracks.  Remove anything that might make you trip as you walk through a door, such as a raised step or threshold.  Trim any bushes or trees on the path to your home.  Use bright outdoor lighting.  Clear any walking paths of anything that might make someone trip, such as rocks or tools.  Regularly check to see if handrails are loose or broken. Make sure that both sides of any steps have handrails.  Any raised decks and porches should have  guardrails on the edges.  Have any leaves, snow, or ice cleared regularly.  Use sand or salt on walking paths during winter.  Clean up any spills in your garage right away. This includes oil or grease spills. What can I do in the bathroom?  Use night lights.  Install grab bars by the toilet and in the tub and shower. Do not use towel bars as grab bars.  Use non-skid mats or decals in the tub or shower.  If you need to sit down in the shower, use a plastic, non-slip stool.  Keep the floor dry. Clean up any water that spills on the floor as soon as it happens.  Remove soap buildup in the tub or shower regularly.  Attach bath mats securely with double-sided non-slip rug tape.  Do not have throw rugs and other things on the floor that can make you trip. What can I do in the bedroom?  Use night lights.  Make sure that you have a light by your bed that is easy to reach.  Do not use any sheets or blankets that are too big for your bed. They should not hang down  onto the floor.  Have a firm chair that has side arms. You can use this for support while you get dressed.  Do not have throw rugs and other things on the floor that can make you trip. What can I do in the kitchen?  Clean up any spills right away.  Avoid walking on wet floors.  Keep items that you use a lot in easy-to-reach places.  If you need to reach something above you, use a strong step stool that has a grab bar.  Keep electrical cords out of the way.  Do not use floor polish or wax that makes floors slippery. If you must use wax, use non-skid floor wax.  Do not have throw rugs and other things on the floor that can make you trip. What can I do with my stairs?  Do not leave any items on the stairs.  Make sure that there are handrails on both sides of the stairs and use them. Fix handrails that are broken or loose. Make sure that handrails are as long as the stairways.  Check any carpeting to make sure that  it is firmly attached to the stairs. Fix any carpet that is loose or worn.  Avoid having throw rugs at the top or bottom of the stairs. If you do have throw rugs, attach them to the floor with carpet tape.  Make sure that you have a light switch at the top of the stairs and the bottom of the stairs. If you do not have them, ask someone to add them for you. What else can I do to help prevent falls?  Wear shoes that:  Do not have high heels.  Have rubber bottoms.  Are comfortable and fit you well.  Are closed at the toe. Do not wear sandals.  If you use a stepladder:  Make sure that it is fully opened. Do not climb a closed stepladder.  Make sure that both sides of the stepladder are locked into place.  Ask someone to hold it for you, if possible.  Clearly mark and make sure that you can see:  Any grab bars or handrails.  First and last steps.  Where the edge of each step is.  Use tools that help you move around (mobility aids) if they are needed. These include:  Canes.  Walkers.  Scooters.  Crutches.  Turn on the lights when you go into a dark area. Replace any light bulbs as soon as they burn out.  Set up your furniture so you have a clear path. Avoid moving your furniture around.  If any of your floors are uneven, fix them.  If there are any pets around you, be aware of where they are.  Review your medicines with your doctor. Some medicines can make you feel dizzy. This can increase your chance of falling. Ask your doctor what other things that you can do to help prevent falls. This information is not intended to replace advice given to you by your health care provider. Make sure you discuss any questions you have with your health care provider. Document Released: 04/30/2009 Document Revised: 12/10/2015 Document Reviewed: 08/08/2014 Elsevier Interactive Patient Education  2017 [employer].

## 2020-08-17 ENCOUNTER — Other Ambulatory Visit: Payer: Self-pay

## 2020-08-17 VITALS — Ht 65.0 in | Wt 183.1 lb

## 2020-08-17 DIAGNOSIS — R0609 Other forms of dyspnea: Secondary | ICD-10-CM

## 2020-08-17 DIAGNOSIS — R0600 Dyspnea, unspecified: Secondary | ICD-10-CM | POA: Diagnosis not present

## 2020-08-17 NOTE — Patient Instructions (Addendum)
Patient Instructions  Patient Details  Name: [name] MRN: [id number] Date of Birth: 1940/11/27 Referring Provider:  Minna Merritts, MD  Below are your personal goals for exercise, nutrition, and risk factors. Our goal is to help you stay on track towards obtaining and maintaining these goals. We will be discussing your progress on these goals with you throughout the program.  Initial Exercise Prescription:  Initial Exercise Prescription - 08/17/20 1600      Date of Initial Exercise RX and Referring Provider   Date 08/17/20    Referring Provider Ida Rogue MD      NuStep   Level 1    SPM 80    Minutes 15    METs 1.8      T5 Nustep   Level 1    SPM 80    Minutes 15    METs 1.8      Biostep-RELP   Level 1    SPM 50    Minutes 15    METs 1.8      Prescription Details   Frequency (times per week) 3    Duration Progress to 30 minutes of continuous aerobic without signs/symptoms of physical distress      Intensity   THRR 40-80% of Max Heartrate 94-125    Ratings of Perceived Exertion 11-13    Perceived Dyspnea 0-4      Progression   Progression Continue to progress workloads to maintain intensity without signs/symptoms of physical distress.      Resistance Training   Training Prescription Yes           Exercise Goals: Frequency: Be able to perform aerobic exercise two to three times per week in program working toward 2-5 days per week of home exercise.  Intensity: Work with a perceived exertion of 11 (fairly light) - 15 (hard) while following your exercise prescription.  We will make changes to your prescription with you as you progress through the program.   Duration: Be able to do 30 to 45 minutes of continuous aerobic exercise in addition to a 5 minute warm-up and a 5 minute cool-down routine.   Nutrition Goals: Your personal nutrition goals will be established when you do your nutrition analysis with the dietician.  The following are general  nutrition guidelines to follow: Cholesterol < 200mg /day Sodium < 1500mg /day Fiber: Women over 50 yrs - 21 grams per day  Personal Goals:  Personal Goals and Risk Factors at Admission - 08/17/20 1653      Core Components/Risk Factors/Patient Goals on Admission    Weight Management Yes;Weight Loss    Intervention Weight Management: Develop a combined nutrition and exercise program designed to reach desired caloric intake, while maintaining appropriate intake of nutrient and fiber, sodium and fats, and appropriate energy expenditure required for the weight goal.;Weight Management: Provide education and appropriate resources to help participant work on and attain dietary goals.    Admit Weight 183 lb (83 kg)    Goal Weight: Short Term 178 lb (80.7 kg)    Goal Weight: Long Term 173 lb (78.5 kg)    Expected Outcomes Long Term: Adherence to nutrition and physical activity/exercise program aimed toward attainment of established weight goal;Short Term: Continue to assess and modify interventions until short term weight is achieved;Weight Loss: Understanding of general recommendations for a balanced deficit meal plan, which promotes 1-2 lb weight loss per week and includes a negative energy balance of [phone] kcal/d;Understanding recommendations for meals to include 15-35% energy as protein,  25-35% energy from fat, 35-60% energy from carbohydrates, less than 200mg  of dietary cholesterol, 20-35 gm of total fiber daily;Understanding of distribution of calorie intake throughout the day with the consumption of 4-5 meals/snacks    Hypertension Yes    Intervention Provide education on lifestyle modifcations including regular physical activity/exercise, weight management, moderate sodium restriction and increased consumption of fresh fruit, vegetables, and low fat dairy, alcohol moderation, and smoking cessation.;Monitor prescription use compliance.    Expected Outcomes Short Term: Continued assessment and  intervention until BP is < 140/51mm HG in hypertensive participants. < 130/82mm HG in hypertensive participants with diabetes, heart failure or chronic kidney disease.;Long Term: Maintenance of blood pressure at goal levels.    Lipids Yes    Intervention Provide education and support for participant on nutrition & aerobic/resistive exercise along with prescribed medications to achieve LDL 70mg , HDL >40mg .    Expected Outcomes Short Term: Participant states understanding of desired cholesterol values and is compliant with medications prescribed. Participant is following exercise prescription and nutrition guidelines.;Long Term: Cholesterol controlled with medications as prescribed, with individualized exercise RX and with personalized nutrition plan. Value goals: LDL < 70mg , HDL > 40 mg.           Tobacco Use Initial Evaluation: Social History   Tobacco Use  Smoking Status Never Smoker  Smokeless Tobacco Never Used    Exercise Goals and Review:  Exercise Goals    Row Name 08/17/20 1655             Exercise Goals   Increase Physical Activity Yes       Intervention Provide advice, education, support and counseling about physical activity/exercise needs.;Develop an individualized exercise prescription for aerobic and resistive training based on initial evaluation findings, risk stratification, comorbidities and participant's personal goals.       Expected Outcomes Short Term: Attend rehab on a regular basis to increase amount of physical activity.;Long Term: Add in home exercise to make exercise part of routine and to increase amount of physical activity.;Long Term: Exercising regularly at least 3-5 days a week.       Increase Strength and Stamina Yes       Intervention Provide advice, education, support and counseling about physical activity/exercise needs.;Develop an individualized exercise prescription for aerobic and resistive training based on initial evaluation findings, risk  stratification, comorbidities and participant's personal goals.       Expected Outcomes Short Term: Increase workloads from initial exercise prescription for resistance, speed, and METs.;Short Term: Perform resistance training exercises routinely during rehab and add in resistance training at home;Long Term: Improve cardiorespiratory fitness, muscular endurance and strength as measured by increased METs and functional capacity (6MWT)       Able to understand and use rate of perceived exertion (RPE) scale Yes       Intervention Provide education and explanation on how to use RPE scale       Expected Outcomes Short Term: Able to use RPE daily in rehab to express subjective intensity level;Long Term:  Able to use RPE to guide intensity level when exercising independently       Able to understand and use Dyspnea scale Yes       Intervention Provide education and explanation on how to use Dyspnea scale       Expected Outcomes Short Term: Able to use Dyspnea scale daily in rehab to express subjective sense of shortness of breath during exertion;Long Term: Able to use Dyspnea scale to guide intensity level when exercising  independently       Knowledge and understanding of Target Heart Rate Range (THRR) Yes       Intervention Provide education and explanation of THRR including how the numbers were predicted and where they are located for reference       Expected Outcomes Short Term: Able to state/look up THRR;Short Term: Able to use daily as guideline for intensity in rehab;Long Term: Able to use THRR to govern intensity when exercising independently       Able to check pulse independently Yes       Intervention Provide education and demonstration on how to check pulse in carotid and radial arteries.;Review the importance of being able to check your own pulse for safety during independent exercise       Expected Outcomes Short Term: Able to explain why pulse checking is important during independent  exercise;Long Term: Able to check pulse independently and accurately       Understanding of Exercise Prescription Yes       Intervention Provide education, explanation, and written materials on patient's individual exercise prescription       Expected Outcomes Short Term: Able to explain program exercise prescription;Long Term: Able to explain home exercise prescription to exercise independently              Copy of goals given to participant.

## 2020-08-17 NOTE — Progress Notes (Signed)
Pulmonary Individual Treatment Plan  Patient Details  Name: [name] MRN: [id number] Date of Birth: 03-25-1941 Referring Provider:   Flowsheet Row Pulmonary Rehab from 08/17/2020 in [facility] Cardiac and Pulmonary Rehab  Referring Provider Ida Rogue MD      Initial Encounter Date:  Flowsheet Row Pulmonary Rehab from 08/17/2020 in [facility] Cardiac and Pulmonary Rehab  Date 08/17/20      Visit Diagnosis: DOE (dyspnea on exertion)  Patient's Home Medications on Admission:  Current Outpatient Medications:  .  albuterol (PROAIR HFA) 108 (90 Base) MCG/ACT inhaler, Inhale 1-2 puffs into the lungs every 6 (six) hours as needed for wheezing or shortness of breath., Disp: 18 g, Rfl: 11 .  amLODipine (NORVASC) 5 MG tablet, Take 1 tablet (5 mg total) by mouth daily. In am with food, Disp: 90 tablet, Rfl: 3 .  Ascorbic Acid (VITAMIN C) 1000 MG tablet, Take 1,000 mg by mouth daily., Disp: , Rfl:  .  aspirin 81 MG tablet, TAKE 1 TABLET BY MOUTH EVERY DAY, Disp: , Rfl:  .  atorvastatin (LIPITOR) 20 MG tablet, Take 1 tablet (20 mg total) by mouth daily., Disp: 90 tablet, Rfl: 3 .  Calcium 600-200 MG-UNIT tablet, Take 1 tablet daily by mouth., Disp: , Rfl:  .  Cholecalciferol (VITAMIN D3) 2000 UNITS capsule, Take 2,000 Units by mouth daily., Disp: , Rfl:  .  FOLIC ACID PO, Take by mouth daily., Disp: , Rfl:  .  levothyroxine (SYNTHROID) 75 MCG tablet, Take 1 tablet (75 mcg total) by mouth daily before breakfast., Disp: 90 tablet, Rfl: 3 .  Magnesium 250 MG TABS, Take 250 mg by mouth daily., Disp: , Rfl:  .  olmesartan (BENICAR) 20 MG tablet, Take 0.5 tablets (10 mg total) by mouth daily. If BP>130/>80 take another 1/2 pill, Disp: 45 tablet, Rfl: 3 .  Omega-3 Fatty Acids (FISH OIL) 1000 MG CAPS, Take 1,000 mg by mouth daily. , Disp: , Rfl:  .  omeprazole (PRILOSEC) 20 MG capsule, Take 1 capsule (20 mg total) by mouth 2 (two) times daily before a meal., Disp: 60 capsule, Rfl: 3 .  sertraline (ZOLOFT)  25 MG tablet, Take 1 tablet (25 mg total) by mouth daily., Disp: 90 tablet, Rfl: 1 .  vitamin B-12 (CYANOCOBALAMIN) 1000 MCG tablet, Take 2,000 mcg by mouth daily. 2 tabs, Disp: , Rfl:  .  Zinc 50 MG CAPS, Take 50 mg by mouth daily., Disp: , Rfl:   Past Medical History: Past Medical History:  Diagnosis Date  . Acid reflux   . Allergy   . Anemia   . Anxiety    in past  . Arthritis    all over  . Asthma   . Atypical moles   . Bronchitis   . Bronchospasm    reactive with perfume/cologne and smoke  . CAD (coronary artery disease)    patient denies on preop of 10/24   . Candida esophagitis ([place])   . Carotid artery calcification, bilateral 04/2016  . Cataract   . Cervical stenosis of spine   . Chronic insomnia   . Complication of anesthesia    shoulder surgery- not all the way asleep  . Depression    in past  . Diverticulitis   . Diverticulosis   . Dizziness   . Elevated liver enzymes   . Family history of adverse reaction to anesthesia    sister- has problems with nausea and vomiting   . Fatigue   . Fatty liver   .  Generalized headache    migraines  . GERD (gastroesophageal reflux disease)   . History of chemotherapy    topical cream for h/o skin CA  . History of kidney stones   . History of neuropathy    FH Dr. Posey [facility] neurology  . History of vertigo   . Hx of migraines   . Hyperlipidemia   . Hypothyroidism   . Insomnia   . Irritable bowel syndrome   . LLQ abdominal pain   . Melanoma ([place]) 2013  . Melanoma ([place])    x2 stg II melanoma  . Neuropathy, peripheral   . Peripheral neuropathy   . Pneumonia    hx of x 2  . PONV (postoperative nausea and vomiting)   . Pre-diabetes   . Retina disorder    left  . SCC (squamous cell carcinoma)    skin   . Stroke [facility])    ? TIA per Dr V per patient   . Thyroid disease   . Urine incontinence   . Vitamin D deficiency   . Weight gain     Tobacco Use: Social History   Tobacco Use  Smoking Status Never Smoker   Smokeless Tobacco Never Used    Labs: Recent Review Flowsheet Data    Labs for ITP Cardiac and Pulmonary Rehab Latest Ref Rng & Units 12/22/2014 05/10/2017 12/05/2018 09/09/2019 03/25/2020   Cholestrol 0 - 200 mg/dL 181 - 148 162 142   LDLCALC 0 - 99 mg/dL 108(H) - 58 60 52   HDL >39.00 mg/dL 57 - 71.60 83.50 69.80   Trlycerides 0.0 - 149.0 mg/dL 79 - 94.0 91.0 99.0   Hemoglobin A1c 4.6 - 6.5 % - 5.9(H) 6.2 6.0 6.1       Pulmonary Assessment Scores:  Pulmonary Assessment Scores    Row Name 08/17/20 1630         ADL UCSD   ADL Phase Entry     SOB Score total 32     Rest 1     Walk 2     Stairs 4     Bath 1     Dress 1     Shop 1           CAT Score   CAT Score 14           mMRC Score   mMRC Score 3            UCSD: Self-administered rating of dyspnea associated with activities of daily living (ADLs) 6-point scale (0 = "not at all" to 5 = "maximal or unable to do because of breathlessness")  Scoring Scores range from 0 to 120.  Minimally important difference is 5 units  CAT: CAT can identify the health impairment of COPD patients and is better correlated with disease progression.  CAT has a scoring range of zero to 40. The CAT score is classified into four groups of low (less than 10), medium (10 - 20), high (21-30) and very high (31-40) based on the impact level of disease on health status. A CAT score over 10 suggests significant symptoms.  A worsening CAT score could be explained by an exacerbation, poor medication adherence, poor inhaler technique, or progression of COPD or comorbid conditions.  CAT MCID is 2 points  mMRC: mMRC (Modified Medical Research Council) Dyspnea Scale is used to assess the degree of baseline functional disability in patients of respiratory disease due to dyspnea. No minimal important difference is established. A decrease in score of  1 point or greater is considered a positive change.   Pulmonary Function Assessment:   Exercise Target  Goals: Exercise Program Goal: Individual exercise prescription set using results from initial 6 min walk test and THRR while considering  patient's activity barriers and safety.   Exercise Prescription Goal: Initial exercise prescription builds to 30-45 minutes a day of aerobic activity, 2-3 days per week.  Home exercise guidelines will be given to patient during program as part of exercise prescription that the participant will acknowledge.  Education: Aerobic Exercise: - Group verbal and visual presentation on the components of exercise prescription. Introduces F.I.T.T principle from ACSM for exercise prescriptions.  Reviews F.I.T.T. principles of aerobic exercise including progression. Written material given at graduation.   Education: Resistance Exercise: - Group verbal and visual presentation on the components of exercise prescription. Introduces F.I.T.T principle from ACSM for exercise prescriptions  Reviews F.I.T.T. principles of resistance exercise including progression. Written material given at graduation.    Education: Exercise & Equipment Safety: - Individual verbal instruction and demonstration of equipment use and safety with use of the equipment. Flowsheet Row Pulmonary Rehab from 08/10/2020 in [facility] Cardiac and Pulmonary Rehab  Date 08/10/20  Educator [facility]  Instruction Review Code 1- Verbalizes Understanding      Education: Exercise Physiology & General Exercise Guidelines: - Group verbal and written instruction with models to review the exercise physiology of the cardiovascular system and associated critical values. Provides general exercise guidelines with specific guidelines to those with heart or lung disease.    Education: Flexibility, Balance, Mind/Body Relaxation: - Group verbal and visual presentation with interactive activity on the components of exercise prescription. Introduces F.I.T.T principle from ACSM for exercise prescriptions. Reviews F.I.T.T. principles of  flexibility and balance exercise training including progression. Also discusses the mind body connection.  Reviews various relaxation techniques to help reduce and manage stress (i.e. Deep breathing, progressive muscle relaxation, and visualization). Balance handout provided to take home. Written material given at graduation.   Activity Barriers & Risk Stratification:  Activity Barriers & Cardiac Risk Stratification - 08/17/20 1638      Activity Barriers & Cardiac Risk Stratification   Activity Barriers Arthritis;Shortness of Breath;Left Hip Replacement;Left Knee Replacement;Other (comment)    Comments Rotator cuff surgery, L shoulder; Had accident on R side, completely torn R shoulder with surgery           6 Minute Walk:  Oxygen Initial Assessment:  Oxygen Initial Assessment - 08/17/20 1629      Home Oxygen   Home Oxygen Device None    Sleep Oxygen Prescription None    Home Exercise Oxygen Prescription None    Home Resting Oxygen Prescription None    Compliance with Home Oxygen Use Yes      Initial 6 min Walk   Oxygen Used None      Program Oxygen Prescription   Program Oxygen Prescription None      Intervention   Short Term Goals To learn and demonstrate proper use of respiratory medications;To learn and demonstrate proper pursed lip breathing techniques or other breathing techniques.;To learn and understand importance of maintaining oxygen saturations>88%;To learn and understand importance of monitoring SPO2 with pulse oximeter and demonstrate accurate use of the pulse oximeter.    Long  Term Goals Verbalizes importance of monitoring SPO2 with pulse oximeter and return demonstration;Maintenance of O2 saturations>88%;Exhibits proper breathing techniques, such as pursed lip breathing or other method taught during program session;Compliance with respiratory medication;Demonstrates proper use of MDI's  Oxygen Re-Evaluation:   Oxygen Discharge (Final Oxygen  Re-Evaluation):   Initial Exercise Prescription:  Initial Exercise Prescription - 08/17/20 1600      Date of Initial Exercise RX and Referring Provider   Date 08/17/20    Referring Provider Ida Rogue MD      NuStep   Level 1    SPM 80    Minutes 15    METs 1.8      T5 Nustep   Level 1    SPM 80    Minutes 15    METs 1.8      Biostep-RELP   Level 1    SPM 50    Minutes 15    METs 1.8      Prescription Details   Frequency (times per week) 3    Duration Progress to 30 minutes of continuous aerobic without signs/symptoms of physical distress      Intensity   THRR 40-80% of Max Heartrate 94-125    Ratings of Perceived Exertion 11-13    Perceived Dyspnea 0-4      Progression   Progression Continue to progress workloads to maintain intensity without signs/symptoms of physical distress.      Resistance Training   Training Prescription Yes           Perform Capillary Blood Glucose checks as needed.  Exercise Prescription Changes:   Exercise Prescription Changes    Row Name 08/17/20 1600             Response to Exercise   Blood Pressure (Admit) 116/62       Blood Pressure (Exercise) 152/64       Blood Pressure (Exit) 118/64       Heart Rate (Admit) 63 bpm       Heart Rate (Exercise) 101 bpm       Heart Rate (Exit) 66 bpm       Oxygen Saturation (Admit) 96 %       Oxygen Saturation (Exercise) 98 %       Oxygen Saturation (Exit) 99 %       Rating of Perceived Exertion (Exercise) 13       Perceived Dyspnea (Exercise) 3       Symptoms SOB, dizziness (her usual)       Comments walk test results               Resistance Training   Weight 3 lb       Reps 10-15              Exercise Comments:   Exercise Goals and Review:   Exercise Goals    Row Name 08/17/20 1655             Exercise Goals   Increase Physical Activity Yes       Intervention Provide advice, education, support and counseling about physical activity/exercise  needs.;Develop an individualized exercise prescription for aerobic and resistive training based on initial evaluation findings, risk stratification, comorbidities and participant's personal goals.       Expected Outcomes Short Term: Attend rehab on a regular basis to increase amount of physical activity.;Long Term: Add in home exercise to make exercise part of routine and to increase amount of physical activity.;Long Term: Exercising regularly at least 3-5 days a week.       Increase Strength and Stamina Yes       Intervention Provide advice, education, support and counseling about physical activity/exercise needs.;Develop an individualized exercise prescription for aerobic  and resistive training based on initial evaluation findings, risk stratification, comorbidities and participant's personal goals.       Expected Outcomes Short Term: Increase workloads from initial exercise prescription for resistance, speed, and METs.;Short Term: Perform resistance training exercises routinely during rehab and add in resistance training at home;Long Term: Improve cardiorespiratory fitness, muscular endurance and strength as measured by increased METs and functional capacity (6MWT)       Able to understand and use rate of perceived exertion (RPE) scale Yes       Intervention Provide education and explanation on how to use RPE scale       Expected Outcomes Short Term: Able to use RPE daily in rehab to express subjective intensity level;Long Term:  Able to use RPE to guide intensity level when exercising independently       Able to understand and use Dyspnea scale Yes       Intervention Provide education and explanation on how to use Dyspnea scale       Expected Outcomes Short Term: Able to use Dyspnea scale daily in rehab to express subjective sense of shortness of breath during exertion;Long Term: Able to use Dyspnea scale to guide intensity level when exercising independently       Knowledge and understanding of  Target Heart Rate Range (THRR) Yes       Intervention Provide education and explanation of THRR including how the numbers were predicted and where they are located for reference       Expected Outcomes Short Term: Able to state/look up THRR;Short Term: Able to use daily as guideline for intensity in rehab;Long Term: Able to use THRR to govern intensity when exercising independently       Able to check pulse independently Yes       Intervention Provide education and demonstration on how to check pulse in carotid and radial arteries.;Review the importance of being able to check your own pulse for safety during independent exercise       Expected Outcomes Short Term: Able to explain why pulse checking is important during independent exercise;Long Term: Able to check pulse independently and accurately       Understanding of Exercise Prescription Yes       Intervention Provide education, explanation, and written materials on patient's individual exercise prescription       Expected Outcomes Short Term: Able to explain program exercise prescription;Long Term: Able to explain home exercise prescription to exercise independently              Exercise Goals Re-Evaluation :   Discharge Exercise Prescription (Final Exercise Prescription Changes):  Exercise Prescription Changes - 08/17/20 1600      Response to Exercise   Blood Pressure (Admit) 116/62    Blood Pressure (Exercise) 152/64    Blood Pressure (Exit) 118/64    Heart Rate (Admit) 63 bpm    Heart Rate (Exercise) 101 bpm    Heart Rate (Exit) 66 bpm    Oxygen Saturation (Admit) 96 %    Oxygen Saturation (Exercise) 98 %    Oxygen Saturation (Exit) 99 %    Rating of Perceived Exertion (Exercise) 13    Perceived Dyspnea (Exercise) 3    Symptoms SOB, dizziness (her usual)    Comments walk test results      Resistance Training   Weight 3 lb    Reps 10-15           Nutrition:  Target Goals: Understanding of nutrition guidelines, daily  intake  of sodium 1500mg , cholesterol 200mg , calories 30% from fat and 7% or less from saturated fats, daily to have 5 or more servings of fruits and vegetables.  Education: All About Nutrition: -Group instruction provided by verbal, written material, interactive activities, discussions, models, and posters to present general guidelines for heart healthy nutrition including fat, fiber, MyPlate, the role of sodium in heart healthy nutrition, utilization of the nutrition label, and utilization of this knowledge for meal planning. Follow up email sent as well. Written material given at graduation.   Biometrics:  Pre Biometrics - 08/17/20 1639      Pre Biometrics   Height 5\' 5"  (1.651 m)    Weight 183 lb 1.6 oz (83.1 kg)    BMI (Calculated) 30.47    Single Leg Stand 2.44 seconds            Nutrition Therapy Plan and Nutrition Goals:   Nutrition Assessments:  MEDIFICTS Score Key:  ?70 Need to make dietary changes   40-70 Heart Healthy Diet  ? 40 Therapeutic Level Cholesterol Diet   Picture Your Plate Scores:  [id number] Unhealthy dietary pattern with much room for improvement.  41-50 Dietary pattern unlikely to meet recommendations for good health and room for improvement.  51-60 More healthful dietary pattern, with some room for improvement.   >60 Healthy dietary pattern, although there may be some specific behaviors that could be improved.   Nutrition Goals Re-Evaluation:   Nutrition Goals Discharge (Final Nutrition Goals Re-Evaluation):   Psychosocial: Target Goals: Acknowledge presence or absence of significant depression and/or stress, maximize coping skills, provide positive support system. Participant is able to verbalize types and ability to use techniques and skills needed for reducing stress and depression.   Education: Stress, Anxiety, and Depression - Group verbal and visual presentation to define topics covered.  Reviews how body is impacted by stress, anxiety,  and depression.  Also discusses healthy ways to reduce stress and to treat/manage anxiety and depression.  Written material given at graduation.   Education: Sleep Hygiene -Provides group verbal and written instruction about how sleep can affect your health.  Define sleep hygiene, discuss sleep cycles and impact of sleep habits. Review good sleep hygiene tips.    Initial Review & Psychosocial Screening:  Initial Psych Review & Screening - 08/10/20 1344      Initial Review   Current issues with Current Psychotropic Meds;Current Depression;History of Depression;Current Anxiety/Panic;Current Sleep Concerns      Family Dynamics   Good Support System? Yes    Comments Zigmund Daniel states that she has three children and she has been concerned about them. One of her daughters is married to a control freak andis concerned about her. She has had skin cancer and 5 years of oncology. She could not take care of her new puppy when she was going through therapy and had a contract with the vet to hold on to her and adopted her puppy out. Zigmund Daniel can look to her friends and other two kids for support.      Barriers   Psychosocial barriers to participate in program The patient should benefit from training in stress management and relaxation.      Screening Interventions   Interventions Encouraged to exercise;To provide support and resources with identified psychosocial needs;Provide feedback about the scores to participant    Expected Outcomes Short Term goal: Utilizing psychosocial counselor, staff and physician to assist with identification of specific Stressors or current issues interfering with healing process. Setting desired goal for each  stressor or current issue identified.;Long Term Goal: Stressors or current issues are controlled or eliminated.;Short Term goal: Identification and review with participant of any Quality of Life or Depression concerns found by scoring the questionnaire.;Long Term goal: The participant  improves quality of Life and PHQ9 Scores as seen by post scores and/or verbalization of changes           Quality of Life Scores:  Scores of 19 and below usually indicate a poorer quality of life in these areas.  A difference of  2-3 points is a clinically meaningful difference.  A difference of 2-3 points in the total score of the Quality of Life Index has been associated with significant improvement in overall quality of life, self-image, physical symptoms, and general health in studies assessing change in quality of life.  PHQ-9: Recent Review Flowsheet Data    Depression screen [facility] 2/9 08/17/2020 08/10/2020 02/28/2020 11/01/2019 08/08/2019   Decreased Interest 1 1 0 0 0   Down, Depressed, Hopeless 1 0 0 0 1   PHQ - 2 Score 2 1 0 0 1   Altered sleeping 2 - 0 - -   Tired, decreased energy 1 - 0 - -   Change in appetite 1 - 0 - -   Feeling bad or failure about yourself  1 - 0 - -   Trouble concentrating 0 - 0 - -   Moving slowly or fidgety/restless 0 - 0 - -   Suicidal thoughts 0 - 0 - -   PHQ-9 Score 7 - 0 - -   Difficult doing work/chores Not difficult at all - Not difficult at all - -     Interpretation of Total Score  Total Score Depression Severity:  1-4 = Minimal depression, 5-9 = Mild depression, 10-14 = Moderate depression, 15-19 = Moderately severe depression, 20-27 = Severe depression   Psychosocial Evaluation and Intervention:  Psychosocial Evaluation - 08/10/20 1348      Psychosocial Evaluation & Interventions   Interventions Encouraged to exercise with the program and follow exercise prescription;Stress management education;Relaxation education    Comments Zigmund Daniel states that she has three children and she has been concerned about them. One of her daughters is married to a control freak andis concerned about her. She has had skin cancer and 5 years of oncology. She could not take care of her new puppy when she was going through therapy and had a contract with the vet to hold  on to her and adopted her puppy out. Zigmund Daniel can look to her friends and other two kids for support.    Expected Outcomes Short: Exercise regularly to support mental health and notify staff of any changes. Long: maintain mental health and well being through teaching of rehab or prescribed medications independently.    Continue Psychosocial Services  Follow up required by staff           Psychosocial Re-Evaluation:   Psychosocial Discharge (Final Psychosocial Re-Evaluation):   Education: Education Goals: Education classes will be provided on a weekly basis, covering required topics. Participant will state understanding/return demonstration of topics presented.  Learning Barriers/Preferences:  Learning Barriers/Preferences - 08/10/20 1341      Learning Barriers/Preferences   Learning Barriers None    Learning Preferences None           General Pulmonary Education Topics:  Infection Prevention: - Provides verbal and written material to individual with discussion of infection control including proper hand washing and proper equipment cleaning during exercise session.  Flowsheet Row Pulmonary Rehab from 08/10/2020 in [facility] Cardiac and Pulmonary Rehab  Date 08/10/20  Educator [facility]  Instruction Review Code 1- Verbalizes Understanding      Falls Prevention: - Provides verbal and written material to individual with discussion of falls prevention and safety. Flowsheet Row Pulmonary Rehab from 08/10/2020 in [facility] Cardiac and Pulmonary Rehab  Date 08/10/20  Educator [facility]  Instruction Review Code 1- Verbalizes Understanding      Chronic Lung Disease Review: - Group verbal instruction with posters, models, PowerPoint presentations and videos,  to review new updates, new respiratory medications, new advancements in procedures and treatments. Providing information on websites and "800" numbers for continued self-education. Includes information about supplement oxygen, available portable oxygen  systems, continuous and intermittent flow rates, oxygen safety, concentrators, and Medicare reimbursement for oxygen. Explanation of Pulmonary Drugs, including class, frequency, complications, importance of spacers, rinsing mouth after steroid MDI's, and proper cleaning methods for nebulizers. Review of basic lung anatomy and physiology related to function, structure, and complications of lung disease. Review of risk factors. Discussion about methods for diagnosing sleep apnea and types of masks and machines for OSA. Includes a review of the use of types of environmental controls: home humidity, furnaces, filters, dust mite/pet prevention, HEPA vacuums. Discussion about weather changes, air quality and the benefits of nasal washing. Instruction on Warning signs, infection symptoms, calling MD promptly, preventive modes, and value of vaccinations. Review of effective airway clearance, coughing and/or vibration techniques. Emphasizing that all should Create an Action Plan. Written material given at graduation.   AED/CPR: - Group verbal and written instruction with the use of models to demonstrate the basic use of the AED with the basic ABC's of resuscitation.    Anatomy and Cardiac Procedures: - Group verbal and visual presentation and models provide information about basic cardiac anatomy and function. Reviews the testing methods done to diagnose heart disease and the outcomes of the test results. Describes the treatment choices: Medical Management, Angioplasty, or Coronary Bypass Surgery for treating various heart conditions including Myocardial Infarction, Angina, Valve Disease, and Cardiac Arrhythmias.  Written material given at graduation.   Medication Safety: - Group verbal and visual instruction to review commonly prescribed medications for heart and lung disease. Reviews the medication, class of the drug, and side effects. Includes the steps to properly store meds and maintain the prescription  regimen.  Written material given at graduation.   Other: -Provides group and verbal instruction on various topics (see comments)   Knowledge Questionnaire Score:  Knowledge Questionnaire Score - 08/17/20 1628      Knowledge Questionnaire Score   Pre Score 15/18: O2 sats, oxygen use            Core Components/Risk Factors/Patient Goals at Admission:  Personal Goals and Risk Factors at Admission - 08/17/20 1653      Core Components/Risk Factors/Patient Goals on Admission    Weight Management Yes;Weight Loss    Intervention Weight Management: Develop a combined nutrition and exercise program designed to reach desired caloric intake, while maintaining appropriate intake of nutrient and fiber, sodium and fats, and appropriate energy expenditure required for the weight goal.;Weight Management: Provide education and appropriate resources to help participant work on and attain dietary goals.    Admit Weight 183 lb (83 kg)    Goal Weight: Short Term 178 lb (80.7 kg)    Goal Weight: Long Term 173 lb (78.5 kg)    Expected Outcomes Long Term: Adherence to nutrition and physical activity/exercise program aimed  toward attainment of established weight goal;Short Term: Continue to assess and modify interventions until short term weight is achieved;Weight Loss: Understanding of general recommendations for a balanced deficit meal plan, which promotes 1-2 lb weight loss per week and includes a negative energy balance of [id number] kcal/d;Understanding recommendations for meals to include 15-35% energy as protein, 25-35% energy from fat, 35-60% energy from carbohydrates, less than 200mg  of dietary cholesterol, 20-35 gm of total fiber daily;Understanding of distribution of calorie intake throughout the day with the consumption of 4-5 meals/snacks    Hypertension Yes    Intervention Provide education on lifestyle modifcations including regular physical activity/exercise, weight management, moderate sodium  restriction and increased consumption of fresh fruit, vegetables, and low fat dairy, alcohol moderation, and smoking cessation.;Monitor prescription use compliance.    Expected Outcomes Short Term: Continued assessment and intervention until BP is < 140/55mm HG in hypertensive participants. < 130/76mm HG in hypertensive participants with diabetes, heart failure or chronic kidney disease.;Long Term: Maintenance of blood pressure at goal levels.    Lipids Yes    Intervention Provide education and support for participant on nutrition & aerobic/resistive exercise along with prescribed medications to achieve LDL 70mg , HDL >40mg .    Expected Outcomes Short Term: Participant states understanding of desired cholesterol values and is compliant with medications prescribed. Participant is following exercise prescription and nutrition guidelines.;Long Term: Cholesterol controlled with medications as prescribed, with individualized exercise RX and with personalized nutrition plan. Value goals: LDL < 70mg , HDL > 40 mg.           Education:Diabetes - Individual verbal and written instruction to review signs/symptoms of diabetes, desired ranges of glucose level fasting, after meals and with exercise. Acknowledge that pre and post exercise glucose checks will be done for 3 sessions at entry of program.   Know Your Numbers and Heart Failure: - Group verbal and visual instruction to discuss disease risk factors for cardiac and pulmonary disease and treatment options.  Reviews associated critical values for Overweight/Obesity, Hypertension, Cholesterol, and Diabetes.  Discusses basics of heart failure: signs/symptoms and treatments.  Introduces Heart Failure Zone chart for action plan for heart failure.  Written material given at graduation.   Core Components/Risk Factors/Patient Goals Review:    Core Components/Risk Factors/Patient Goals at Discharge (Final Review):    ITP Comments:  ITP Comments    Row Name  08/10/20 1340 08/17/20 1626         ITP Comments Virtual Visit completed. Patient informed on EP and RD appointment and 6 Minute walk test. Patient also informed of patient health questionnaires on My Chart. Patient Verbalizes understanding. Visit diagnosis can be found in [facility] 07/21/2020. Completed 6MWT and gym orientation. Initial ITP created and sent for review to Dr. Emily Filbert, Medical Director.             Comments: Initial ITP

## 2020-08-18 ENCOUNTER — Ambulatory Visit: Payer: PPO | Admitting: Pulmonary Disease

## 2020-08-19 ENCOUNTER — Ambulatory Visit: Payer: PPO | Admitting: Primary Care

## 2020-08-19 ENCOUNTER — Telehealth: Payer: Self-pay | Admitting: Primary Care

## 2020-08-19 NOTE — Telephone Encounter (Signed)
Spoke to patient, who stated that she is returning a call from our office.  I do not see record of our office attempting to contact patient.  She is not sure who called and a voicemail was not left.  Patient voiced her understanding and had no further questions.  Nothing further needed.

## 2020-08-20 ENCOUNTER — Telehealth: Payer: Self-pay | Admitting: Internal Medicine

## 2020-08-20 NOTE — Telephone Encounter (Signed)
Patient states the left leg and foot are swollen and some redness. States it hurst to put any pressure on it. Pain rated 5/10. States pain and swelling were worse last night.  Has an appointment with physical therapy at 1:00 pm and wanted to know if she should still go and do her exercises?   Please advise

## 2020-08-20 NOTE — Telephone Encounter (Signed)
Patient called in about her leg stated that her leg is swollen and foot she has rehab today wanted to know if she should go she is on schedule for 2-4 at 11:30

## 2020-08-20 NOTE — Telephone Encounter (Signed)
Spoke with Dr Olivia Mackie McLean-Scocuzza and she agrees that Patient should reschedule her physical therapy appointment.   Patient informed and verbalized understanding.

## 2020-08-21 ENCOUNTER — Other Ambulatory Visit: Payer: Self-pay

## 2020-08-21 ENCOUNTER — Ambulatory Visit (INDEPENDENT_AMBULATORY_CARE_PROVIDER_SITE_OTHER): Payer: PPO | Admitting: Internal Medicine

## 2020-08-21 ENCOUNTER — Encounter: Payer: Self-pay | Admitting: Internal Medicine

## 2020-08-21 VITALS — BP 98/44 | HR 66 | Temp 98.5°F | Ht 66.0 in | Wt 191.8 lb

## 2020-08-21 DIAGNOSIS — I890 Lymphedema, not elsewhere classified: Secondary | ICD-10-CM

## 2020-08-21 DIAGNOSIS — R600 Localized edema: Secondary | ICD-10-CM

## 2020-08-21 DIAGNOSIS — I10 Essential (primary) hypertension: Secondary | ICD-10-CM

## 2020-08-21 DIAGNOSIS — Z1231 Encounter for screening mammogram for malignant neoplasm of breast: Secondary | ICD-10-CM | POA: Diagnosis not present

## 2020-08-21 DIAGNOSIS — L03116 Cellulitis of left lower limb: Secondary | ICD-10-CM | POA: Diagnosis not present

## 2020-08-21 DIAGNOSIS — I959 Hypotension, unspecified: Secondary | ICD-10-CM

## 2020-08-21 DIAGNOSIS — I739 Peripheral vascular disease, unspecified: Secondary | ICD-10-CM | POA: Diagnosis not present

## 2020-08-21 MED ORDER — DOXYCYCLINE HYCLATE 100 MG PO TABS: 100.0000 mg | ORAL_TABLET | Freq: Two times a day (BID) | ORAL | 0 refills | Status: DC

## 2020-08-21 MED ORDER — AMLODIPINE BESYLATE 2.5 MG PO TABS: 2.5000 mg | ORAL_TABLET | Freq: Every day | ORAL | 3 refills | Status: DC

## 2020-08-21 NOTE — Patient Instructions (Addendum)
Reduce norvasc 5 mg to 2.5 mg daily   Allegra, Claritin, or zyrtec at night over the counter allergy pill    Lymphedema  Lymphedema is swelling that is caused by the abnormal collection of lymph in the tissues under the skin. Lymph is excess fluid from the tissues in your body that is removed through the lymphatic system. This system is part of your body's defense system (immune system) and includes lymph nodes and lymph vessels. The lymph vessels collect and carry the excess fluid, fats, proteins, and waste from the tissues of the body to the bloodstream. This system also works to clean and remove bacteria and waste products from the body. Lymphedema occurs when the lymphatic system is blocked. When the lymph vessels or lymph nodes are blocked or damaged, lymph does not drain properly. This causes an abnormal buildup of lymph, which leads to swelling in the affected area. This may include the trunk area, or an arm or leg. Lymphedema cannot be cured by medicines, but various methods can be used to help reduce the swelling. What are the causes? The cause of this condition depends on the type of lymphedema that you have.  Primary lymphedema is caused by the absence of lymph vessels or having abnormal lymph vessels at birth.  Secondary lymphedema occurs when lymph vessels are blocked or damaged. Secondary lymphedema is more common. Common causes of lymph vessel blockage include: ? Skin infection, such as cellulitis. ? Infection by parasites (filariasis). ? Injury. ? Radiation therapy. ? Cancer. ? Formation of scar tissue. ? Surgery. What are the signs or symptoms? Symptoms of this condition include:  Swelling of the arm or leg.  A heavy or tight feeling in the arm or leg.  Swelling of the feet, toes, or fingers. Shoes or rings may fit more tightly than before.  Redness of the skin over the affected area.  Limited movement of the affected limb.  Sensitivity to touch or discomfort in  the affected limb. How is this diagnosed? This condition may be diagnosed based on:  Your symptoms and medical history.  A physical exam.  Bioimpedance spectroscopy. In this test, painless electrical currents are used to measure fluid levels in your body.  Imaging tests, such as: ? MRI. ? CT scan. ? Duplex ultrasound. This test uses sound waves to produce images of the vessels and the blood flow on a screen. ? Lymphoscintigraphy. In this test, a low dose of a radioactive substance is injected to trace the flow of lymph through your lymph vessels. ? Lymphangiography. In this test, a contrast dye is injected into the lymph vessel to help show blockages. How is this treated? If an underlying condition is causing the lymphedema, that condition will be treated. For example, antibiotic medicines may be used to treat an infection. Treatment for this condition will depend on the cause of your lymphedema. Treatment may include:  Complete decongestive therapy (CDT). This is done by a certified lymphedema therapist to reduce fluid congestion. This therapy includes: ? Skin care. ? Compression wrapping of the affected area. ? Manual lymph drainage. This is a special massage technique that promotes lymph drainage out of a limb. ? Specific exercises. Certain exercises can help fluid move out of the affected limb.  Compression. Various methods may be used to apply pressure to the affected limb to reduce the swelling. They include: ? Wearing compression stockings or sleeves on the affected limb. ? Wrapping the affected limb with special bandages.  Surgery. This is usually  done for severe cases only. For example, surgery may be done if you have trouble moving the limb or if the swelling does not get better with other treatments.   Follow these instructions at home: Self-care  The affected area is more likely to become injured or infected. Take these steps to help prevent infection: ? Keep the  affected area clean and dry. ? Use approved creams or lotions to keep the skin moisturized. ? Protect your skin from cuts:  Use gloves while cooking or gardening.  Do not walk barefoot.  If you shave the affected area, use an Copy.  Do not wear tight clothes, shoes, or jewelry.  Eat a healthy diet that includes a lot of fruits and vegetables. Activity  Do exercises as told by your health care provider.  Do not sit with your legs crossed.  When possible, keep the affected limb raised (elevated) above the level of your heart.  Avoid carrying things with an arm that is affected by lymphedema. General instructions  Wear compression stockings or sleeves as told by your health care provider.  Note any changes in size of the affected limb. You may be instructed to take regular measurements and keep track of them.  Take over-the-counter and prescription medicines only as told by your health care provider.  If you were prescribed an antibiotic medicine, take or apply it as told by your health care provider. Do not stop using the antibiotic even if you start to feel better or if your condition improves.  Do not use heating pads or ice packs on the affected area.  Avoid having blood draws, IV insertions, or blood pressure checks on the affected limb.  Keep all follow-up visits. This is important. Contact a health care provider if you:  Continue to have swelling in your limb.  Have fluid leaking from the skin of your swollen limb.  Have a cut that does not heal.  Have redness or pain in the affected area.  Develop purplish spots, rash, blisters, or sores (lesions) on your affected limb. Get help right away if you:  Have new swelling in your limb that starts suddenly.  Have shortness of breath or chest pain.  Have a fever or chills. These symptoms may represent a serious problem that is an emergency. Do not wait to see if the symptoms will go away. Get medical help  right away. Call your local emergency services (911 in the [place] Do not drive yourself to the hospital. Summary  Lymphedema is swelling that is caused by the abnormal collection of lymph in the tissues under the skin.  Lymph is fluid from the tissues in your body that is removed through the lymphatic system. This system collects and carries excess fluid, fats, proteins, and wastes from the tissues of the body to the bloodstream.  Lymphedema causes swelling, pain, and redness in the affected area. This may include the trunk area, or an arm or leg.  Treatment for this condition may depend on the cause of your lymphedema. Treatment may include treating the underlying cause, complete decongestive therapy (CDT), compression methods, or surgery. This information is not intended to replace advice given to you by your health care provider. Make sure you discuss any questions you have with your health care provider. Document Revised: 04/29/2020 Document Reviewed: 04/29/2020 Elsevier Patient Education  2021 [employer].   Hypotension As your heart beats, it forces blood through your body. Hypotension, commonly called low blood pressure, is when the force  of blood pumping through your arteries is too weak. Arteries are blood vessels that carry blood from the heart throughout the body. Depending on the cause and severity, hypotension may be harmless (benign) or may cause serious problems (be critical). When blood pressure is too low, you may not get enough blood to your brain or to the rest of your organs. This can cause weakness, light-headedness, rapid heartbeat, and fainting. What are the causes? This condition may be caused by:  Blood loss.  Loss of body fluids (dehydration).  Heart problems.  Hormone (endocrine) problems.  Pregnancy.  Severe infection.  Lack of certain nutrients.  Severe allergic reactions (anaphylaxis).  Certain medicines, such as blood pressure medicine or  medicines that make the body lose excess fluids (diuretics). Sometimes, hypotension may be caused by not taking medicine as directed, such as taking too much of a certain medicine. What increases the risk? The following factors may make you more likely to develop this condition:  Age. Risk increases as you get older.  Conditions that affect the heart or the central nervous system.  Taking certain medicines, such as blood pressure medicine or diuretics.  Being pregnant. What are the signs or symptoms? Common symptoms of this condition include:  Weakness.  Light-headedness.  Dizziness.  Blurred vision.  Fatigue.  Rapid heartbeat.  Fainting, in severe cases. How is this diagnosed? This condition is diagnosed based on:  Your medical history.  Your symptoms.  Your blood pressure measurement. Your health care provider will check your blood pressure when you are: ? Lying down. ? Sitting. ? Standing. A blood pressure reading is recorded as two numbers, such as "120 over 80" (or 120/80). The first ("top") number is called the systolic pressure. It is a measure of the pressure in your arteries as your heart beats. The second ("bottom") number is called the diastolic pressure. It is a measure of the pressure in your arteries when your heart relaxes between beats. Blood pressure is measured in a unit called mm Hg. Healthy blood pressure for most adults is 120/80. If your blood pressure is below 90/60, you may be diagnosed with hypotension. Other information or tests that may be used to diagnose hypotension include:  Your other vital signs, such as your heart rate and temperature.  Blood tests.  Tilt table test. For this test, you will be safely secured to a table that moves you from a lying position to an upright position. Your heart rhythm and blood pressure will be monitored during the test. How is this treated? Treatment for this condition may include:  Changing your diet.  This may involve eating more salt (sodium) or drinking more water.  Taking medicines to raise your blood pressure.  Changing the dosage of certain medicines you are taking that might be lowering your blood pressure.  Wearing compression stockings. These stockings help to prevent blood clots and reduce swelling in your legs. In some cases, you may need to go to the hospital for:  Fluid replacement. This means you will receive fluids through an IV.  Blood replacement. This means you will receive donated blood through an IV (transfusion).  Treating an infection or heart problems, if this applies.  Monitoring. You may need to be monitored while medicines that you are taking wear off. Follow these instructions at home: Eating and drinking  Drink enough fluid to keep your urine pale yellow.  Eat a healthy diet, and follow instructions from your health care provider about eating or drinking restrictions.  A healthy diet includes: ? Fresh fruits and vegetables. ? Whole grains. ? Lean meats. ? Low-fat dairy products.  Eat extra salt only as directed. Do not add extra salt to your diet unless your health care provider told you to do that.  Eat frequent, small meals.  Avoid standing up suddenly after eating.   Medicines  Take over-the-counter and prescription medicines only as told by your health care provider. ? Follow instructions from your health care provider about changing the dosage of your current medicines, if this applies. ? Do not stop or adjust any of your medicines on your own. General instructions  Wear compression stockings as told by your health care provider.  Get up slowly from lying down or sitting positions. This gives your blood pressure a chance to adjust.  Avoid hot showers and excessive heat as directed by your health care provider.  Return to your normal activities as told by your health care provider. Ask your health care provider what activities are safe for  you.  Do not use any products that contain nicotine or tobacco, such as cigarettes, e-cigarettes, and chewing tobacco. If you need help quitting, ask your health care provider.  Keep all follow-up visits as told by your health care provider. This is important.   Contact a health care provider if you:  Vomit.  Have diarrhea.  Have a fever for more than 2-3 days.  Feel more thirsty than usual.  Feel weak and tired. Get help right away if you:  Have chest pain.  Have a fast or irregular heartbeat.  Develop numbness in any part of your body.  Cannot move your arms or your legs.  Have trouble speaking.  Become sweaty or feel light-headed.  Faint.  Feel short of breath.  Have trouble staying awake.  Feel confused. Summary  Hypotension is when the force of blood pumping through your arteries is too weak.  Hypotension may be harmless (benign) or may cause serious problems (be critical).  Treatment for this condition may include changing your diet, changing your medicines, and wearing compression stockings.  In some cases, you may need to go to the hospital for fluid or blood replacement. This information is not intended to replace advice given to you by your health care provider. Make sure you discuss any questions you have with your health care provider. Document Revised: 12/28/2017 Document Reviewed: 12/28/2017 Elsevier Patient Education  [place].

## 2020-08-21 NOTE — Progress Notes (Signed)
Chief Complaint  Patient presents with  . Edema  . Hypotension   F/u 1. Chronic leg swelling L>right and slow to heal wounds when she has had skin cancer biopsies and excisions. Leg has swelling today 4/10 pain and redness w/in the last few days  05/03/20 US DVT negative   2. Hypotension, lightheadedness and blurry vision BP today initially 98/40 hr 66 on norvasc 5 mg qd and benicar 10 mg qd  Home readings upper 90s/60s and 125/78 max sbp 135  Pt has sxs today due to low blood presure  Orthostatics negative lying 102/46 HR 61 sitting 100/48 HR 64 standing 98/44 hr 66    Review of Systems  Constitutional: Negative for weight loss.  HENT: Negative for hearing loss.   Eyes: Negative for blurred vision.  Respiratory: Positive for shortness of breath.   Cardiovascular: Positive for leg swelling. Negative for chest pain.  Musculoskeletal: Positive for falls.  Skin: Positive for rash.  Psychiatric/Behavioral: Negative for memory loss.   Past Medical History:  Diagnosis Date  . Acid reflux   . Allergy   . Anemia   . Anxiety    in past  . Arthritis    all over  . Asthma   . Atypical moles   . Bronchitis   . Bronchospasm    reactive with perfume/cologne and smoke  . CAD (coronary artery disease)    patient denies on preop of 10/24   . Candida esophagitis ([place])   . Carotid artery calcification, bilateral 04/2016  . Cataract   . Cervical stenosis of spine   . Chronic insomnia   . Complication of anesthesia    shoulder surgery- not all the way asleep  . Depression    in past  . Diverticulitis   . Diverticulosis   . Dizziness   . Elevated liver enzymes   . Family history of adverse reaction to anesthesia    sister- has problems with nausea and vomiting   . Fatigue   . Fatty liver   . Generalized headache    migraines  . GERD (gastroesophageal reflux disease)   . History of chemotherapy    topical cream for h/o skin CA  . History of kidney stones   . History of  neuropathy    FH Dr. Posey Pronto neurology  . History of vertigo   . Hx of migraines   . Hyperlipidemia   . Hypothyroidism   . Insomnia   . Irritable bowel syndrome   . LLQ abdominal pain   . Melanoma ([place]) 2013  . Melanoma ([place])    x2 stg II melanoma  . Neuropathy, peripheral   . Peripheral neuropathy   . Pneumonia    hx of x 2  . PONV (postoperative nausea and vomiting)   . Pre-diabetes   . Retina disorder    left  . SCC (squamous cell carcinoma)    skin   . Stroke [facility])    ? TIA per Dr V per patient   . Thyroid disease   . Urine incontinence   . Vitamin D deficiency   . Weight gain    Past Surgical History:  Procedure Laterality Date  . BILATERAL SALPINGOOPHORECTOMY     ovarian cyst Dr. Ouida Sills and Dr. Alisia Ferrari 2008  . BREAST SURGERY     in 2014 bx   . BREAST SURGERY     augmentation with saline Dr. Hubbard Hartshorn 1995   . CHOLECYSTECTOMY N/A 01/28/2015   Procedure: LAPAROSCOPIC CHOLECYSTECTOMY WITH INTRAOPERATIVE CHOLANGIOGRAM;  Surgeon: Autumn Messing III, MD;  Location: [facility];  Service: General;  Laterality: N/A;  . CLOSED MANIPULATION SHOULDER     rt  . COLONOSCOPY WITH PROPOFOL N/A 11/20/2012   Procedure: COLONOSCOPY WITH PROPOFOL;  Surgeon: Garlan Fair, MD;  Location: [facility];  Service: Endoscopy;  Laterality: N/A;  . colonscopy     May 2014  . JOINT REPLACEMENT     RTK  . KNEE ARTHROSCOPY    . left knee miniscus tear repair    . MELANOMA EXCISION WITH SENTINEL LYMPH NODE BIOPSY  03/18/11   Back, nodes neg  . OOPHORECTOMY    . ROTATOR CUFF REPAIR     left  . SHOULDER SURGERY     right and left   . SKIN CANCER EXCISION     multiple  . TOTAL HIP ARTHROPLASTY    . TOTAL KNEE ARTHROPLASTY     right 2010 and left in 2018   . TOTAL KNEE ARTHROPLASTY Left 05/16/2017   Procedure: LEFT TOTAL KNEE ARTHROPLASTY;  Surgeon: Paralee Cancel, MD;  Location: [facility];  Service: Orthopedics;  Laterality: Left;  70 mins  . TUBAL LIGATION    . TUMOR REMOVAL     ovary    Family History  Problem Relation Age of Onset  . Hypertension Mother   . Stroke Mother   . Hyperlipidemia Mother   . Heart disease Father   . Diabetes Father   . Hypertension Father   . Hyperlipidemia Father   . Early death Father   . Diabetes Sister   . Heart disease Sister        heart murmur  . Arthritis Sister   . COPD Sister   . Hyperlipidemia Sister   . Hypertension Sister   . Pancreatic cancer Maternal Uncle   . Alzheimer's disease Paternal Aunt   . Colon cancer Maternal Grandfather   . Cancer Maternal Grandfather   . Early death Maternal Grandfather   . Alzheimer's disease Paternal Aunt   . Alzheimer's disease Paternal Aunt   . Colon polyps Sister        adenomous  . Arthritis Sister   . Heart disease Sister   . Hyperlipidemia Sister   . Hypertension Sister   . Cancer Sister   . Heart disease Daughter   . Hyperlipidemia Daughter   . Hypertension Daughter   . Heart disease Son   . Hyperlipidemia Son   . Alcohol abuse Son   . Depression Son   . Diabetes Son   . Stroke Maternal Grandmother   . Early death Paternal Grandmother   . Stroke Paternal Grandmother   . Cancer Other        2 paternal uncles and 1 maternal uncle colon cancer  . Alzheimer's disease Paternal Aunt    Social History   Socioeconomic History  . Marital status: Divorced    Spouse name: Not on file  . Number of children: 3  . Years of education: College  . Highest education level: Some college, no degree  Occupational History  . Occupation: Retired    Fish farm manager: RETIRED  Tobacco Use  . Smoking status: Never Smoker  . Smokeless tobacco: Never Used  Vaping Use  . Vaping Use: Never used  Substance and Sexual Activity  . Alcohol use: Yes    Alcohol/week: 1.0 standard drink    Types: 1 Cans of beer per week    Comment: once a week beer or wine  . Drug use: No  .  Sexual activity: Not Currently  Other Topics Concern  . Not on file  Social History Narrative   Patient lives at  home alone in a one story home.   Has 3 children (2 daughters and 1 son)    Retired from [employer]   Divorced since 1992    Caffeine Use: 1 cup daily   No guns    Wears seat belt    Lives with cat   Social Determinants of Health   Financial Resource Strain: Not on file  Food Insecurity: Not on file  Transportation Needs: Not on file  Physical Activity: Not on file  Stress: Not on file  Social Connections: Not on file  Intimate Partner Violence: Not on file   Current Meds  Medication Sig  . albuterol (PROAIR HFA) 108 (90 Base) MCG/ACT inhaler Inhale 1-2 puffs into the lungs every 6 (six) hours as needed for wheezing or shortness of breath.  . Ascorbic Acid (VITAMIN C) 1000 MG tablet Take 1,000 mg by mouth daily.  Marland Kitchen aspirin 81 MG tablet TAKE 1 TABLET BY MOUTH EVERY DAY  . atorvastatin (LIPITOR) 20 MG tablet Take 1 tablet (20 mg total) by mouth daily.  . Calcium 600-200 MG-UNIT tablet Take 1 tablet daily by mouth.  . Cholecalciferol (VITAMIN D3) 2000 UNITS capsule Take 2,000 Units by mouth daily.  Marland Kitchen doxycycline (VIBRA-TABS) 100 MG tablet Take 1 tablet (100 mg total) by mouth 2 (two) times daily. With food x 7-10 days  . FOLIC ACID PO Take by mouth daily.  Marland Kitchen levothyroxine (SYNTHROID) 75 MCG tablet Take 1 tablet (75 mcg total) by mouth daily before breakfast.  . Magnesium 250 MG TABS Take 250 mg by mouth daily.  Marland Kitchen olmesartan (BENICAR) 20 MG tablet Take 0.5 tablets (10 mg total) by mouth daily. If BP>130/>80 take another 1/2 pill  . Omega-3 Fatty Acids (FISH OIL) 1000 MG CAPS Take 1,000 mg by mouth daily.   . sertraline (ZOLOFT) 25 MG tablet Take 1 tablet (25 mg total) by mouth daily.  . vitamin B-12 (CYANOCOBALAMIN) 1000 MCG tablet Take 2,000 mcg by mouth daily. 2 tabs  . Zinc 50 MG CAPS Take 50 mg by mouth daily.  . [DISCONTINUED] amLODipine (NORVASC) 5 MG tablet Take 1 tablet (5 mg total) by mouth daily. In am with food   Allergies  Allergen Reactions  .  Epinephrine Other (See Comments) and Anaphylaxis    Heart palpitations  . Penicillins Swelling and Other (See Comments)    Lip swelling Has patient had a PCN reaction causing immediate rash, facial/tongue/throat swelling, SOB or lightheadedness with hypotension: Yes Has patient had a PCN reaction causing severe rash involving mucus membranes or skin necrosis: No Has patient had a PCN reaction that required hospitalization No Has patient had a PCN reaction occurring within the last 10 years: No If all of the above answers are "NO", then may proceed with Cephalosporin use.  . Lidocaine Hcl Other (See Comments)  . Other     Black pepper- caused bronchial tubes to start closing   . Valium [Diazepam] Other (See Comments)    Other reaction(s): Other (See Comments) Overly sensitive  sedation   Recent Results (from the past 2160 hour(s))  Urinalysis     Status: None   Collection Time: 06/02/20 11:02 AM  Result Value Ref Range   Color, Urine YELLOW YELLOW   APPearance CLEAR CLEAR   Specific Gravity, Urine 1.010 1.001 - 1.03   pH 6.5 5.0 - 8.0  Glucose, UA NEGATIVE NEGATIVE   Bilirubin Urine NEGATIVE NEGATIVE   Ketones, ur NEGATIVE NEGATIVE   Hgb urine dipstick NEGATIVE NEGATIVE   Protein, ur NEGATIVE NEGATIVE   Nitrite NEGATIVE NEGATIVE   Leukocytes,Ua NEGATIVE NEGATIVE  Urine Culture     Status: None   Collection Time: 06/04/20 11:38 AM   Specimen: Urine  Result Value Ref Range   MICRO NUMBER: [id number]    SPECIMEN QUALITY: Adequate    Sample Source NOT GIVEN    STATUS: FINAL    Result: No Growth   Novel Coronavirus, NAA ([facility])     Status: None   Collection Time: 08/06/20  5:18 PM   Specimen: Nasopharyngeal(NP) swabs in vial transport medium   Nasopharynge  Screenin  Result Value Ref Range   SARS-CoV-2, NAA Not Detected Not Detected    Comment: This nucleic acid amplification test was developed and its performance characteristics determined by [employer]. Nucleic  acid amplification tests include RT-PCR and TMA. This test has not been FDA cleared or approved. This test has been authorized by FDA under an Emergency Use Authorization (EUA). This test is only authorized for the duration of time the declaration that circumstances exist justifying the authorization of the emergency use of in vitro diagnostic tests for detection of SARS-CoV-2 virus and/or diagnosis of COVID-19 infection under section 564(b)(1) of the Act, 21 U.S.C. 753YYF-1(T) (1), unless the authorization is terminated or revoked sooner. When diagnostic testing is negative, the possibility of a false negative result should be considered in the context of a patient's recent exposures and the presence of clinical signs and symptoms consistent with COVID-19. An individual without symptoms of COVID-19 and who is not shedding SARS-CoV-2 virus wo uld expect to have a negative (not detected) result in this assay.   SARS-COV-2, NAA 2 DAY TAT     Status: None   Collection Time: 08/06/20  5:18 PM   Nasopharynge  Screenin  Result Value Ref Range   SARS-CoV-2, NAA 2 DAY TAT Performed    Objective  Body mass index is 30.96 kg/m. Wt Readings from Last 3 Encounters:  08/21/20 191 lb 12.8 oz (87 kg)  08/17/20 183 lb 1.6 oz (83.1 kg)  08/10/20 192 lb (87.1 kg)   Temp Readings from Last 3 Encounters:  08/21/20 98.5 F (36.9 C)  05/07/20 98.5 F (36.9 C) (Temporal)  05/03/20 97.7 F (36.5 C) (Oral)   BP Readings from Last 3 Encounters:  08/21/20 (!) 98/44  08/10/20 112/70  07/20/20 130/62   Pulse Readings from Last 3 Encounters:  08/21/20 66  08/10/20 60  07/20/20 (!) 59    Physical Exam Vitals and nursing note reviewed.  Constitutional:      Appearance: Normal appearance. She is well-developed and well-groomed.  HENT:     Head: Normocephalic and atraumatic.  Eyes:     Conjunctiva/sclera: Conjunctivae normal.     Pupils: Pupils are equal, round, and reactive to light.   Cardiovascular:     Rate and Rhythm: Normal rate and regular rhythm.     Heart sounds: Normal heart sounds. No murmur heard.   Pulmonary:     Effort: Pulmonary effort is normal.     Breath sounds: Normal breath sounds.  Musculoskeletal:     Left lower leg: 2+ Edema present.  Skin:    General: Skin is warm and moist.  Neurological:     General: No focal deficit present.     Mental Status: She is alert and oriented to person, place,  and time.     Gait: Gait normal.  Psychiatric:        Attention and Perception: Attention and perception normal.        Mood and Affect: Mood and affect normal.        Speech: Speech normal.        Behavior: Behavior normal. Behavior is cooperative.        Thought Content: Thought content normal.        Cognition and Memory: Cognition and memory normal.        Judgment: Judgment normal.     Assessment  Plan  Leg edema L>R c/w lymphedema and slow to heal wounds ?PAD Will refer vvs in [place] D.r Fields/[place] for ABI further [name] testing if needed  05/03/20 [name] left lower ext  FINDINGS: VENOUS  Normal compressibility of the common femoral, superficial femoral, and popliteal veins, as well as the visualized calf veins. Visualized portions of profunda femoral vein and great saphenous vein unremarkable. No filling defects to suggest DVT on grayscale or color Doppler imaging. Doppler waveforms show normal direction of venous flow, normal respiratory plasticity and response to augmentation.  Limited views of the contralateral common femoral vein are unremarkable.  OTHER  None.  Limitations: none  IMPRESSION: Negative.   Hypotension Reduce BP meds norvasc from 5 mg to 2.5 mg qd  Hydration  Cont benicar 10 mg qd and can take another 1/2 pill if BP >130/>80 Can try higher sbp threshold ~140s since pt is sxmatic with low sbps and dbps  HM Flu shotutd Tdap-had TD 10/18/12 not pertussis will need this in future shingrixwill  need if has not had  zostervax had 06/16/11 prevnarhad 05/15/14 covid 3/3 vaccines utdpfizer  Pna 23had 12/16/05, 05/18/16  Pap out of age window LMP [place] with implants7/6/21 normal -10/18/16 screening with implants neg FH breast cancer p cousins dx'ed in 61s x 3, sister Had at age 54 y.o and colon cancer age 74, 2 paternal cousins ovarian cancer age 73 and 33  01/21/20 negative ordered [facility]  Colonoscopy 11/2012 tortuous colon no further rec. -she is established and f/u GI Dr. Farrel Gordon [place] virtual coloscopy   DEXA Sadie Haber PCP 06/28/16 normal reviewed report did not scan into chart -h/o vitamin D def on D3 2000 IU daily  [facility] Dr. Delman Cheadle mohs surgery F/u from 04/10/19 left lower leg cellulitis for SCC changed to levaquin rec compression socks and acidic acid soaks qhs   05/15/19 f/u appt s/p SCC mohs removal f/u in 3 months Dr. Winifred Olive  Echo 01/07/20  FINDINGS  Left Ventricle: Left ventricular ejection fraction, by estimation, is 60  to 65%. The left ventricle has normal function. The left ventricle has no  regional wall motion abnormalities. The left ventricular internal cavity  size was normal in size. There is  borderline left ventricular hypertrophy. Left ventricular diastolic  parameters are consistent with age-related delayed relaxation (normal).   Right Ventricle: The right ventricular size is normal. No increase in  right ventricular wall thickness. Right ventricular systolic function is  normal. There is normal pulmonary artery systolic pressure. The tricuspid  regurgitant velocity is 2.46 m/s, and  with an assumed right atrial pressure of 3 mmHg, the estimated right  ventricular systolic pressure is 69.4 mmHg.   Left Atrium: Left atrial size was normal in size.   Right Atrium: Right atrial size was normal in size.   Pericardium: There is no evidence  of pericardial effusion.   Mitral  Valve: The mitral valve is degenerative in appearance. There is  mild thickening of the mitral valve leaflet(s). Mild mitral valve  regurgitation. No evidence of mitral valve stenosis. MV peak gradient, 3.7  mmHg. The mean mitral valve gradient is  1.0 mmHg.   Tricuspid Valve: The tricuspid valve is normal in structure. Tricuspid  valve regurgitation is mild to moderate.   Aortic Valve: The aortic valve is tricuspid. Aortic valve regurgitation is  not visualized. No aortic stenosis is present. Aortic valve mean gradient  measures 4.0 mmHg. Aortic valve peak gradient measures 9.1 mmHg. Aortic  valve area, by VTI measures 2.19  cm.   Pulmonic Valve: The pulmonic valve was not well visualized. Pulmonic valve  regurgitation is mild.   Aorta: The aortic root is normal in size and structure.   Pulmonary Artery: The pulmonary artery is not well seen.   Venous: The inferior vena cava is normal in size with greater than 50%  respiratory variability, suggesting right atrial pressure of 3 mmHg.   IAS/Shunts: The interatrial septum was not well visualized.   [facility] saw 01/01/19 increased nocturia and urgency pending Botox to tx and UDS Dr. Junious Silk and try myrbetriq before UDS  Cards-Dr. Rockey Situ  GI Leb GI Dr. Moss Mc MD Dr. Cordelia Pen [facility]-retinal specialist  Dentist Dr. Lynelle Smoke  Neurology-[facility]  ENT-Dr. Tami Ribas for vestibular balance issue s Podiatry Dr. Milinda Pointer  H/o D.r Earlie Server [facility] Lung-Dr. Silvana Newness Dr. Alvan Dame  Surgery Dr.Toth  Dermatology Dr. Delman Cheadle in [place] h/o [facility] left leg and MM right leg,neck and backwith node dissection Dr. Marlou Starks 8/12  Former PCP Dr. Jossie Ng  -pt brought in all records week of 09/21/2018 and reviewed  -h/o iron def anemia  -last seen 07/09/18 and 08/09/18 possible thrush and URI  US carotid 05/13/16 <50% stenosis in right and left ICA -she was at this time put on lipitor 10 mg qhs   CT ab/pelvis 07/26/17 left base scarring lung,  well circumcised liver lesion likely cysts and stable hepatomegaly 19.4 cm cranlocaudal pancreas/spleen normal, extensive colonic diverticulosis, aortic and branch vessell atherosclerosis no adenopathy, normal uterus and adnexa, advanced LS spondylosis grade 1 L4/5 and L5/S1 anterolisthesis are similar, pelvic floor laxity, hiatal hernia  CXR 02/26/18 thoracic aorta mildly atherosclerotic unchanged, Deg changes T spine, Deg changes b/l shoulders prior right shoulder surgery calcification distal left supraspinatus tendon at its insertion on the great tuberosity of the left humerus   UA normal 07/09/18  04/23/18 A1c 6.0  04/23/18 CBC 6.2/14.2/42.9/plts 199  04/23/18 CMET Cr 0.88 BUN 20 EGFR 62, total bili chronically elevated 1.1 (prior direct bili low 0.1 likely unconjugated high) AST15, ALT 14 normal  04/23/18 lipidTC 155, TG 143, HDL 68, LDL 58 10/17/17 vitamin D 41.1 04/18/16 33.7  TSH 06/29/17 2.06  FOBT neg x 3 05/19/16 Provider: Dr. Olivia Mackie McLean-[facility]oc[facility] Medicine

## 2020-08-25 ENCOUNTER — Telehealth: Payer: Self-pay

## 2020-08-25 NOTE — Telephone Encounter (Signed)
Pt states that she was told by PCP to call our office if she had not heard from vein and vascular. I let her know that there was a referral for that and asked if she wanted the telephone number and she said no. Please call pt to advise?

## 2020-08-25 NOTE — Telephone Encounter (Signed)
Ok Thank you calling pt.

## 2020-08-26 ENCOUNTER — Encounter: Payer: Self-pay | Admitting: Internal Medicine

## 2020-08-27 ENCOUNTER — Telehealth: Payer: Self-pay

## 2020-08-27 ENCOUNTER — Ambulatory Visit
Admission: RE | Admit: 2020-08-27 | Discharge: 2020-08-27 | Disposition: A | Discharge status: Home / Self Care | Payer: PPO | Source: Ambulatory Visit | Attending: Internal Medicine | Admitting: Internal Medicine

## 2020-08-27 ENCOUNTER — Other Ambulatory Visit: Payer: Self-pay

## 2020-08-27 ENCOUNTER — Other Ambulatory Visit: Payer: Self-pay | Admitting: Internal Medicine

## 2020-08-27 DIAGNOSIS — R55 Syncope and collapse: Secondary | ICD-10-CM | POA: Insufficient documentation

## 2020-08-27 DIAGNOSIS — A419 Sepsis, unspecified organism: Secondary | ICD-10-CM

## 2020-08-27 DIAGNOSIS — R0600 Dyspnea, unspecified: Secondary | ICD-10-CM

## 2020-08-27 DIAGNOSIS — R0609 Other forms of dyspnea: Secondary | ICD-10-CM

## 2020-08-27 DIAGNOSIS — L0390 Cellulitis, unspecified: Secondary | ICD-10-CM

## 2020-08-27 NOTE — Telephone Encounter (Signed)
Spoken to patient, she is wondering if her leg issue is causing this. She has had the light headedness worse this week than ever before. This week she has fainted and fell on her sofa Tuesday. Patient BP today has been 121/72 bpm 59 and 139/77 bpm 60. She feels better at the moment but she is having episodes then she will hold on then sensation will go away. She is also taking doxycycline, she wants to know if that could be making her sx worse.She doesn't have vascular appointment until march. She wanted to let PCP know what is going on.

## 2020-08-27 NOTE — Telephone Encounter (Signed)
Pt have arrived to get CT done.

## 2020-08-27 NOTE — Telephone Encounter (Signed)
Pt called and states dizziness has gotten worse this week. She states that this morning, she is blacking out and falling on the sofa. I was unable to reach access nurse or Fransisco Beau. Sent at [facility]-pt hung the phone up but is expecting a call back asap

## 2020-08-27 NOTE — Telephone Encounter (Signed)
Spoke with patient- she was not comfortable coming to Pulmonary Rehab today as she reports she has had worse episodes of lightheadedness and dizziness, and also is in talk with her doctor about possible lymphedema. Encouraged her to call her doctor as soon as we hung up to report her worsening symptoms. Advised not to drive and call [phone] if it is warranted. Will place patient on medical hold for rehab until she gets more information from her providers. Patient agreed with plan.

## 2020-08-27 NOTE — Telephone Encounter (Signed)
Referral sent 1 week ago  Can anyone see this pt sooner in this office recurrent cellulitis left leg/lymphedema?    Arianna have her come in for labs, BP check today and order CT head with fainting thanks  Will see if left leg if she is septic and causing low bp with labs but today BP normal

## 2020-09-01 ENCOUNTER — Ambulatory Visit: Payer: PPO | Admitting: Internal Medicine

## 2020-09-01 ENCOUNTER — Telehealth: Payer: Self-pay | Admitting: Internal Medicine

## 2020-09-01 NOTE — Telephone Encounter (Signed)
Can pt come in labs cmet, cbc, lactic acid  Referred to [facility] in [place] urgently  How is left leg doing?

## 2020-09-01 NOTE — Telephone Encounter (Signed)
Patient called in for lab results  ?

## 2020-09-01 NOTE — Addendum Note (Signed)
Addended by: Orland Mustard on: 09/01/2020 05:16 PM   Modules accepted: Orders

## 2020-09-01 NOTE — Telephone Encounter (Signed)
[name] states that she was referred for lymphedema and they will not be able to see her until 3/24. They also would not put her on a cancellation list.   [name] wanting to know if Dr Lucky Cowboy had anything sooner?

## 2020-09-02 ENCOUNTER — Telehealth: Payer: Self-pay | Admitting: Neurology

## 2020-09-02 NOTE — Telephone Encounter (Signed)
Called patient and left a message to call back to schedule a follow up appointment with Dr. Posey Pronto re:  I reviewed her imaging and findings are old (seen on imaging from 2017), and would not explain her syncopal spells. Based on what I am seeing in the notes, these spells do not appear to be seizures, either. I will have my office staff contact her to offer follow-up, if patient would like to discuss further.   [name]

## 2020-09-03 NOTE — Telephone Encounter (Signed)
Patient scheduled to come in for labs 2/22 at 9:45 am.  States she received a call from [facility] and was scheduled for an appointment a few days sooner than the previous appointment.  Patient states the leg is doing better

## 2020-09-04 ENCOUNTER — Encounter: Payer: Self-pay | Admitting: Neurology

## 2020-09-04 ENCOUNTER — Other Ambulatory Visit: Payer: Self-pay

## 2020-09-04 ENCOUNTER — Ambulatory Visit: Payer: PPO | Admitting: Neurology

## 2020-09-04 VITALS — BP 135/77 | HR 79 | Ht 66.0 in | Wt 190.0 lb

## 2020-09-04 DIAGNOSIS — R42 Dizziness and giddiness: Secondary | ICD-10-CM | POA: Diagnosis not present

## 2020-09-04 NOTE — Progress Notes (Signed)
Follow-up Visit   Date: 09/04/20    [name] MRN: [id number] DOB: December 16, 1940   Interim History: [name] is a 80 y.o. left-handed Caucasian female with GERD, hypothyroidism, stage IA malignant melanoma of the back (2012) s/p excision, idiopathic neuropathy, vestibular neuritis (followed by ENT) and GERD returning to the clinic with complains of memory changes.  The patient was accompanied to the clinic by self.  She reports having spells of lightheadedness and falls.  No associated loss of consciousness and she is able to recall all the events.  She has the sense of knowing when it is going to happen and when she may fall, but is not always able to break her fall.  There is no abnormal movements, incontinence, or tongue biting.  PCP ordered CT head which showed old right frontal infarct, which was also present in 2017.  No new findings.   Of note, she has had prior spells like this in 2017. She had four presyncopal spells and was evaluated in the ER where MRI of brain not reveal intracranial mass lesion.  There was note of an indeterminate 3 mm lesion at the tip of the dens, which on subsequent follow-up imaging was stable and likely arthritic in nature.   Medications:  Current Outpatient Medications on File Prior to Visit  Medication Sig Dispense Refill  . albuterol (PROAIR HFA) 108 (90 Base) MCG/ACT inhaler Inhale 1-2 puffs into the lungs every 6 (six) hours as needed for wheezing or shortness of breath. 18 g 11  . amLODipine (NORVASC) 2.5 MG tablet Take 1 tablet (2.5 mg total) by mouth daily. In am with food 90 tablet 3  . Ascorbic Acid (VITAMIN C) 1000 MG tablet Take 1,000 mg by mouth daily.    Marland Kitchen aspirin 81 MG tablet TAKE 1 TABLET BY MOUTH EVERY DAY    . atorvastatin (LIPITOR) 20 MG tablet Take 1 tablet (20 mg total) by mouth daily. 90 tablet 3  . Calcium 600-200 MG-UNIT tablet Take 1 tablet daily by mouth.    . Cholecalciferol (VITAMIN D3) 2000 UNITS capsule Take  2,000 Units by mouth daily.    Marland Kitchen FOLIC ACID PO Take by mouth daily.    Marland Kitchen levothyroxine (SYNTHROID) 75 MCG tablet Take 1 tablet (75 mcg total) by mouth daily before breakfast. 90 tablet 3  . Magnesium 250 MG TABS Take 250 mg by mouth daily.    Marland Kitchen olmesartan (BENICAR) 20 MG tablet Take 0.5 tablets (10 mg total) by mouth daily. If BP>130/>80 take another 1/2 pill 45 tablet 3  . Omega-3 Fatty Acids (FISH OIL) 1000 MG CAPS Take 1,000 mg by mouth daily.     Marland Kitchen omeprazole (PRILOSEC) 20 MG capsule Take 1 capsule (20 mg total) by mouth 2 (two) times daily before a meal. 60 capsule 3  . sertraline (ZOLOFT) 25 MG tablet Take 1 tablet (25 mg total) by mouth daily. 90 tablet 1  . vitamin B-12 (CYANOCOBALAMIN) 1000 MCG tablet Take 2,000 mcg by mouth daily. 2 tabs    . Zinc 50 MG CAPS Take 50 mg by mouth daily.    Marland Kitchen doxycycline (VIBRA-TABS) 100 MG tablet Take 1 tablet (100 mg total) by mouth 2 (two) times daily. With food x 7-10 days (Patient not taking: Reported on 09/04/2020) 20 tablet 0   No current facility-administered medications on file prior to visit.    Allergies:  Allergies  Allergen Reactions  . Epinephrine Other (See Comments) and Anaphylaxis    Heart  palpitations  . Penicillins Swelling and Other (See Comments)    Lip swelling Has patient had a PCN reaction causing immediate rash, facial/tongue/throat swelling, SOB or lightheadedness with hypotension: Yes Has patient had a PCN reaction causing severe rash involving mucus membranes or skin necrosis: No Has patient had a PCN reaction that required hospitalization No Has patient had a PCN reaction occurring within the last 10 years: No If all of the above answers are "NO", then may proceed with Cephalosporin use.  . Lidocaine Hcl Other (See Comments)  . Other     Black pepper- caused bronchial tubes to start closing   . Valium [Diazepam] Other (See Comments)    Other reaction(s): Other (See Comments) Overly sensitive  sedation     Vital  Signs:  BP 135/77   Pulse 79   Ht 5\' 6"  (1.676 m)   Wt 190 lb (86.2 kg)   SpO2 94%   BMI 30.67 kg/m   Neurological Exam: MENTAL STATUS including orientation to time, place, person, recent and remote memory, attention span and concentration, language, and fund of knowledge is normal.  Speech is not dysarthric.  CRANIAL NERVES:  Normal conjugate, extra-ocular eye movements in all directions of gaze.  No ptosis.  Face is symmetric.  MOTOR:  Motor strength is 5/5 in all extremities.  No atrophy, fasciculations or abnormal movements.  No pronator drift.  Tone is normal.    MSRs:  Reflexes are 2+/4 throughout, except absent Achilles.  SENSORY:  Vibration diminished below the knees  COORDINATION/GAIT:  Normal finger-to- nose-finger.  Intact rapid alternating movements bilaterally.  Gait narrow based and stable.    Data: Peripheral vascular studies 02/13/2015: Normal ABI  MRI brain and cervical spine 11/10/2014: No acute infarct. Remote right frontal lobe infarct with encephalomalacia. Prominent small vessel disease type changes representing significant progression since prior exam. No intracranial mass lesion noted on this unenhanced exam. Cervical spondylotic changes upper cervical spine most prominent C4-5 level.  MRI brain 08/07/2015: 1. No intracranial mass lesion or abnormal enhancement. 2. Previously noted indeterminate 3 mm lesion at the tip of the dens demonstrates homogeneous postcontrast enhancement. Given the location and morphology of this lesion, this is favored to be degenerative in in nature. However, given the history of melanoma, a short interval follow-up study in 3 months is recommended to document stability to ensure no underlying metastatic lesion is present.  MRI brain wwo contrast 10/19/2015: Negative for metastatic disease to the brain. No acute infarct. Moderate to advanced chronic microvascular ischemia. Large joint effusion of the right TMJ. This is unchanged  and may be related to degenerative change or rheumatoid arthritis 3 mm enhancing lesion the dens is stable and most likely related to arthritis. Question rheumatoid arthritis Negative for metastatic disease to the brain.  NCS/EMG of the upper extremities 01/05/2016: 1. Chronic C6, C7, and C8 radiculopathy affecting right upper extremity, mild in degree electrically. 2. Chronic C8 radiculopathy affecting the left upper extremity, mild in degree electrically. 3. Incidentally, there is a Martin-Gruber anastomosis bilaterally, a normal variant.  US carotids 05/12/2016:  Less than 50% stenosis in the right and left internal carotid arteries.  Neuropsychiatry testing 03/13/2018:  Major depressive disorder, moderate. Rule out generalized anxiety disorder.  CT head wo contrast 08/27/2020: 1. No evidence of acute intracranial abnormality. 2. Remote right frontal infarct and chronic microvascular ischemic change.  IMPRESSION/PLAN: 1.  Presyncope manifesting with lightheadedness.  Doubt seizure so EEG would be of low value. CT head personally reviewed and  does not show any acute findings to explain presentation. Consider cardiac monitoring for arrthymia, which I have asked patient to discuss with her cardiologist.  2. Idiopathic peripheral neuropathy, diagnosed 1990s, manifesting predominately with paresthesias following a glove-stocking distribution - stable.  Return to clinic as needed   Thank you for allowing me to participate in patient's care.  If I can answer any additional questions, I would be pleased to do so.    Sincerely,    Donika K. Posey Pronto, DO

## 2020-09-08 ENCOUNTER — Other Ambulatory Visit (INDEPENDENT_AMBULATORY_CARE_PROVIDER_SITE_OTHER): Payer: PPO

## 2020-09-08 ENCOUNTER — Other Ambulatory Visit: Payer: Self-pay

## 2020-09-08 DIAGNOSIS — L0390 Cellulitis, unspecified: Secondary | ICD-10-CM

## 2020-09-08 DIAGNOSIS — A419 Sepsis, unspecified organism: Secondary | ICD-10-CM

## 2020-09-08 LAB — CBC WITH DIFFERENTIAL/PLATELET
Basophils Absolute: 0 10*3/uL (ref 0.0–0.1)
Basophils Relative: 0.5 % (ref 0.0–3.0)
Eosinophils Absolute: 0.2 10*3/uL (ref 0.0–0.7)
Eosinophils Relative: 2.8 % (ref 0.0–5.0)
HCT: 39.7 % (ref 36.0–46.0)
Hemoglobin: 13.2 g/dL (ref 12.0–15.0)
Lymphocytes Relative: 29.9 % (ref 12.0–46.0)
Lymphs Abs: 1.7 10*3/uL (ref 0.7–4.0)
MCHC: 33.2 g/dL (ref 30.0–36.0)
MCV: 89.1 fl (ref 78.0–100.0)
Monocytes Absolute: 0.5 10*3/uL (ref 0.1–1.0)
Monocytes Relative: 8.1 % (ref 3.0–12.0)
Neutro Abs: 3.4 10*3/uL (ref 1.4–7.7)
Neutrophils Relative %: 58.7 % (ref 43.0–77.0)
Platelets: 205 10*3/uL (ref 150.0–400.0)
RBC: 4.45 Mil/uL (ref 3.87–5.11)
RDW: 13.7 % (ref 11.5–15.5)
WBC: 5.8 10*3/uL (ref 4.0–10.5)

## 2020-09-08 LAB — COMPREHENSIVE METABOLIC PANEL
ALT: 11 U/L (ref 0–35)
AST: 13 U/L (ref 0–37)
Albumin: 4.3 g/dL (ref 3.5–5.2)
Alkaline Phosphatase: 68 U/L (ref 39–117)
BUN: 24 mg/dL — ABNORMAL HIGH (ref 6–23)
CO2: 29 mEq/L (ref 19–32)
Calcium: 9.5 mg/dL (ref 8.4–10.5)
Chloride: 102 mEq/L (ref 96–112)
Creatinine, Ser: 1.02 mg/dL (ref 0.40–1.20)
GFR: 52.24 mL/min — ABNORMAL LOW (ref >60.00)
Glucose, Bld: 129 mg/dL — ABNORMAL HIGH (ref 70–99)
Potassium: 4.6 mEq/L (ref 3.5–5.1)
Sodium: 139 mEq/L (ref 135–145)
Total Bilirubin: 1.2 mg/dL (ref 0.2–1.2)
Total Protein: 6.5 g/dL (ref 6.0–8.3)

## 2020-09-09 ENCOUNTER — Encounter: Payer: Self-pay | Admitting: *Deleted

## 2020-09-09 DIAGNOSIS — R0600 Dyspnea, unspecified: Secondary | ICD-10-CM

## 2020-09-09 DIAGNOSIS — R0609 Other forms of dyspnea: Secondary | ICD-10-CM

## 2020-09-09 NOTE — Progress Notes (Signed)
Pulmonary Individual Treatment Plan  Patient Details  Name: [name] MRN: [id number] Date of Birth: [birth year] Referring Provider:   Flowsheet Row Pulmonary Rehab from 08/17/2020 in [facility] Cardiac and Pulmonary Rehab  Referring Provider Ida Rogue MD      Initial Encounter Date:  Flowsheet Row Pulmonary Rehab from 08/17/2020 in [facility] Cardiac and Pulmonary Rehab  Date 08/17/20      Visit Diagnosis: DOE (dyspnea on exertion)  Patient's Home Medications on Admission:  Current Outpatient Medications:  .  albuterol (PROAIR HFA) 108 (90 Base) MCG/ACT inhaler, Inhale 1-2 puffs into the lungs every 6 (six) hours as needed for wheezing or shortness of breath., Disp: 18 g, Rfl: 11 .  amLODipine (NORVASC) 2.5 MG tablet, Take 1 tablet (2.5 mg total) by mouth daily. In am with food, Disp: 90 tablet, Rfl: 3 .  Ascorbic Acid (VITAMIN C) 1000 MG tablet, Take 1,000 mg by mouth daily., Disp: , Rfl:  .  aspirin 81 MG tablet, TAKE 1 TABLET BY MOUTH EVERY DAY, Disp: , Rfl:  .  atorvastatin (LIPITOR) 20 MG tablet, Take 1 tablet (20 mg total) by mouth daily., Disp: 90 tablet, Rfl: 3 .  Calcium 600-200 MG-UNIT tablet, Take 1 tablet daily by mouth., Disp: , Rfl:  .  Cholecalciferol (VITAMIN D3) 2000 UNITS capsule, Take 2,000 Units by mouth daily., Disp: , Rfl:  .  doxycycline (VIBRA-TABS) 100 MG tablet, Take 1 tablet (100 mg total) by mouth 2 (two) times daily. With food x 7-10 days (Patient not taking: Reported on 09/04/2020), Disp: 20 tablet, Rfl: 0 .  FOLIC ACID PO, Take by mouth daily., Disp: , Rfl:  .  levothyroxine (SYNTHROID) 75 MCG tablet, Take 1 tablet (75 mcg total) by mouth daily before breakfast., Disp: 90 tablet, Rfl: 3 .  Magnesium 250 MG TABS, Take 250 mg by mouth daily., Disp: , Rfl:  .  olmesartan (BENICAR) 20 MG tablet, Take 0.5 tablets (10 mg total) by mouth daily. If BP>130/>80 take another 1/2 pill, Disp: 45 tablet, Rfl: 3 .  Omega-3 Fatty Acids (FISH OIL) 1000 MG CAPS, Take 1,000  mg by mouth daily. , Disp: , Rfl:  .  omeprazole (PRILOSEC) 20 MG capsule, Take 1 capsule (20 mg total) by mouth 2 (two) times daily before a meal., Disp: 60 capsule, Rfl: 3 .  sertraline (ZOLOFT) 25 MG tablet, Take 1 tablet (25 mg total) by mouth daily., Disp: 90 tablet, Rfl: 1 .  vitamin B-12 (CYANOCOBALAMIN) 1000 MCG tablet, Take 2,000 mcg by mouth daily. 2 tabs, Disp: , Rfl:  .  Zinc 50 MG CAPS, Take 50 mg by mouth daily., Disp: , Rfl:   Past Medical History: Past Medical History:  Diagnosis Date  . Acid reflux   . Allergy   . Anemia   . Anxiety    in past  . Arthritis    all over  . Asthma   . Atypical moles   . Bronchitis   . Bronchospasm    reactive with perfume/cologne and smoke  . CAD (coronary artery disease)    patient denies on preop of 10/24   . Candida esophagitis ([place])   . Carotid artery calcification, bilateral 04/2016  . Cataract   . Cervical stenosis of spine   . Chronic insomnia   . Complication of anesthesia    shoulder surgery- not all the way asleep  . Depression    in past  . Diverticulitis   . Diverticulosis   . Dizziness   .  Elevated liver enzymes   . Family history of adverse reaction to anesthesia    sister- has problems with nausea and vomiting   . Fatigue   . Fatty liver   . Generalized headache    migraines  . GERD (gastroesophageal reflux disease)   . History of chemotherapy    topical cream for h/o skin CA  . History of kidney stones   . History of neuropathy    FH Dr. Posey Pronto neurology  . History of vertigo   . Hx of migraines   . Hyperlipidemia   . Hypothyroidism   . Insomnia   . Irritable bowel syndrome   . LLQ abdominal pain   . Melanoma ([place]) 2013  . Melanoma ([place])    x2 stg II melanoma  . Neuropathy, peripheral   . Peripheral neuropathy   . Pneumonia    hx of x 2  . PONV (postoperative nausea and vomiting)   . Pre-diabetes   . Retina disorder    left  . SCC (squamous cell carcinoma)    skin   . Stroke [facility])    ?  TIA per Dr V per patient   . Thyroid disease   . Urine incontinence   . Vitamin D deficiency   . Weight gain     Tobacco Use: Social History   Tobacco Use  Smoking Status Never Smoker  Smokeless Tobacco Never Used    Labs: Recent Review Flowsheet Data    Labs for ITP Cardiac and Pulmonary Rehab Latest Ref Rng & Units 12/22/2014 05/10/2017 12/05/2018 09/09/2019 03/25/2020   Cholestrol 0 - 200 mg/dL 181 - 148 162 142   LDLCALC 0 - 99 mg/dL 108(H) - 58 60 52   HDL >39.00 mg/dL 57 - 71.60 83.50 69.80   Trlycerides 0.0 - 149.0 mg/dL 79 - 94.0 91.0 99.0   Hemoglobin A1c 4.6 - 6.5 % - 5.9(H) 6.2 6.0 6.1       Pulmonary Assessment Scores:  Pulmonary Assessment Scores    Row Name 08/17/20 1630         ADL UCSD   ADL Phase Entry     SOB Score total 32     Rest 1     Walk 2     Stairs 4     Bath 1     Dress 1     Shop 1           CAT Score   CAT Score 14           mMRC Score   mMRC Score 3            UCSD: Self-administered rating of dyspnea associated with activities of daily living (ADLs) 6-point scale (0 = "not at all" to 5 = "maximal or unable to do because of breathlessness")  Scoring Scores range from 0 to 120.  Minimally important difference is 5 units  CAT: CAT can identify the health impairment of COPD patients and is better correlated with disease progression.  CAT has a scoring range of zero to 40. The CAT score is classified into four groups of low (less than 10), medium (10 - 20), high (21-30) and very high (31-40) based on the impact level of disease on health status. A CAT score over 10 suggests significant symptoms.  A worsening CAT score could be explained by an exacerbation, poor medication adherence, poor inhaler technique, or progression of COPD or comorbid conditions.  CAT MCID is 2 points  mMRC: mMRC (  [place]) Dyspnea Scale is used to assess the degree of baseline functional disability in patients of respiratory disease  due to dyspnea. No minimal important difference is established. A decrease in score of 1 point or greater is considered a positive change.   Pulmonary Function Assessment:   Exercise Target Goals: Exercise Program Goal: Individual exercise prescription set using results from initial 6 min walk test and THRR while considering  patient's activity barriers and safety.   Exercise Prescription Goal: Initial exercise prescription builds to 30-45 minutes a day of aerobic activity, 2-3 days per week.  Home exercise guidelines will be given to patient during program as part of exercise prescription that the participant will acknowledge.  Education: Aerobic Exercise: - Group verbal and visual presentation on the components of exercise prescription. Introduces F.I.T.T principle from ACSM for exercise prescriptions.  Reviews F.I.T.T. principles of aerobic exercise including progression. Written material given at graduation.   Education: Resistance Exercise: - Group verbal and visual presentation on the components of exercise prescription. Introduces F.I.T.T principle from ACSM for exercise prescriptions  Reviews F.I.T.T. principles of resistance exercise including progression. Written material given at graduation.    Education: Exercise & Equipment Safety: - Individual verbal instruction and demonstration of equipment use and safety with use of the equipment. Flowsheet Row Pulmonary Rehab from 08/10/2020 in [facility] Cardiac and Pulmonary Rehab  Date 08/10/20  Educator [facility]  Instruction Review Code 1- Verbalizes Understanding      Education: Exercise Physiology & General Exercise Guidelines: - Group verbal and written instruction with models to review the exercise physiology of the cardiovascular system and associated critical values. Provides general exercise guidelines with specific guidelines to those with heart or lung disease.    Education: Flexibility, Balance, Mind/Body Relaxation: - Group  verbal and visual presentation with interactive activity on the components of exercise prescription. Introduces F.I.T.T principle from ACSM for exercise prescriptions. Reviews F.I.T.T. principles of flexibility and balance exercise training including progression. Also discusses the mind body connection.  Reviews various relaxation techniques to help reduce and manage stress (i.e. Deep breathing, progressive muscle relaxation, and visualization). Balance handout provided to take home. Written material given at graduation.   Activity Barriers & Risk Stratification:  Activity Barriers & Cardiac Risk Stratification - 08/17/20 1638      Activity Barriers & Cardiac Risk Stratification   Activity Barriers Arthritis;Shortness of Breath;Left Hip Replacement;Left Knee Replacement;Other (comment)    Comments Rotator cuff surgery, L shoulder; Had accident on R side, completely torn R shoulder with surgery           6 Minute Walk:  6 Minute Walk    Row Name 08/24/20 1251         6 Minute Walk   Phase Initial     Distance 973 feet     Walk Time 6 minutes     # of Rest Breaks 0     MPH 1.84     METS 1.8     RPE 13     Perceived Dyspnea  3     VO2 Peak 6.31     Symptoms Yes (comment)     Comments SOB, dizziness (this is her normal)     Resting HR 63 bpm     Resting BP 116/62     Resting Oxygen Saturation  96 %     Exercise Oxygen Saturation  during 6 min walk 99 %     Max Ex. HR 101 bpm  Max Ex. BP 152/64     2 Minute Post BP 118/63           Interval HR   1 Minute HR 86     2 Minute HR 96     3 Minute HR 101     4 Minute HR 101     5 Minute HR 100     6 Minute HR 100     2 Minute Post HR 79     Interval Heart Rate? Yes           Interval Oxygen   Interval Oxygen? Yes     Baseline Oxygen Saturation % 96 %     1 Minute Oxygen Saturation % 98 %     1 Minute Liters of Oxygen 0 L  RA     2 Minute Oxygen Saturation % 98 %     2 Minute Liters of Oxygen 0 L     3 Minute Oxygen  Saturation % 99 %     3 Minute Liters of Oxygen 0 L     4 Minute Oxygen Saturation % 99 %     4 Minute Liters of Oxygen 0 L     5 Minute Oxygen Saturation % 99 %     5 Minute Liters of Oxygen 0 L     6 Minute Oxygen Saturation % 99 %     6 Minute Liters of Oxygen 0 L     2 Minute Post Oxygen Saturation % 100 %     2 Minute Post Liters of Oxygen 0 L           Oxygen Initial Assessment:  Oxygen Initial Assessment - 08/17/20 1629      Home Oxygen   Home Oxygen Device None    Sleep Oxygen Prescription None    Home Exercise Oxygen Prescription None    Home Resting Oxygen Prescription None    Compliance with Home Oxygen Use Yes      Initial 6 min Walk   Oxygen Used None      Program Oxygen Prescription   Program Oxygen Prescription None      Intervention   Short Term Goals To learn and demonstrate proper use of respiratory medications;To learn and demonstrate proper pursed lip breathing techniques or other breathing techniques.;To learn and understand importance of maintaining oxygen saturations>88%;To learn and understand importance of monitoring SPO2 with pulse oximeter and demonstrate accurate use of the pulse oximeter.    Long  Term Goals Verbalizes importance of monitoring SPO2 with pulse oximeter and return demonstration;Maintenance of O2 saturations>88%;Exhibits proper breathing techniques, such as pursed lip breathing or other method taught during program session;Compliance with respiratory medication;Demonstrates proper use of MDI's           Oxygen Re-Evaluation:   Oxygen Discharge (Final Oxygen Re-Evaluation):   Initial Exercise Prescription:  Initial Exercise Prescription - 08/17/20 1600      Date of Initial Exercise RX and Referring Provider   Date 08/17/20    Referring Provider Ida Rogue MD      NuStep   Level 1    SPM 80    Minutes 15    METs 1.8      T5 Nustep   Level 1    SPM 80    Minutes 15    METs 1.8      Biostep-RELP   Level 1     SPM 50    Minutes 15    METs  1.8      Prescription Details   Frequency (times per week) 3    Duration Progress to 30 minutes of continuous aerobic without signs/symptoms of physical distress      Intensity   THRR 40-80% of Max Heartrate 94-125    Ratings of Perceived Exertion 11-13    Perceived Dyspnea 0-4      Progression   Progression Continue to progress workloads to maintain intensity without signs/symptoms of physical distress.      Resistance Training   Training Prescription Yes           Perform Capillary Blood Glucose checks as needed.  Exercise Prescription Changes:  Exercise Prescription Changes    Row Name 08/17/20 1600             Response to Exercise   Blood Pressure (Admit) 116/62       Blood Pressure (Exercise) 152/64       Blood Pressure (Exit) 118/64       Heart Rate (Admit) 63 bpm       Heart Rate (Exercise) 101 bpm       Heart Rate (Exit) 66 bpm       Oxygen Saturation (Admit) 96 %       Oxygen Saturation (Exercise) 98 %       Oxygen Saturation (Exit) 99 %       Rating of Perceived Exertion (Exercise) 13       Perceived Dyspnea (Exercise) 3       Symptoms SOB, dizziness (her usual)       Comments walk test results               Resistance Training   Weight 3 lb       Reps 10-15              Exercise Comments:   Exercise Goals and Review:  Exercise Goals    Row Name 08/17/20 1655             Exercise Goals   Increase Physical Activity Yes       Intervention Provide advice, education, support and counseling about physical activity/exercise needs.;Develop an individualized exercise prescription for aerobic and resistive training based on initial evaluation findings, risk stratification, comorbidities and participant's personal goals.       Expected Outcomes Short Term: Attend rehab on a regular basis to increase amount of physical activity.;Long Term: Add in home exercise to make exercise part of routine and to increase amount of  physical activity.;Long Term: Exercising regularly at least 3-5 days a week.       Increase Strength and Stamina Yes       Intervention Provide advice, education, support and counseling about physical activity/exercise needs.;Develop an individualized exercise prescription for aerobic and resistive training based on initial evaluation findings, risk stratification, comorbidities and participant's personal goals.       Expected Outcomes Short Term: Increase workloads from initial exercise prescription for resistance, speed, and METs.;Short Term: Perform resistance training exercises routinely during rehab and add in resistance training at home;Long Term: Improve cardiorespiratory fitness, muscular endurance and strength as measured by increased METs and functional capacity (6MWT)       Able to understand and use rate of perceived exertion (RPE) scale Yes       Intervention Provide education and explanation on how to use RPE scale       Expected Outcomes Short Term: Able to use RPE daily in rehab to express subjective intensity  level;Long Term:  Able to use RPE to guide intensity level when exercising independently       Able to understand and use Dyspnea scale Yes       Intervention Provide education and explanation on how to use Dyspnea scale       Expected Outcomes Short Term: Able to use Dyspnea scale daily in rehab to express subjective sense of shortness of breath during exertion;Long Term: Able to use Dyspnea scale to guide intensity level when exercising independently       Knowledge and understanding of Target Heart Rate Range (THRR) Yes       Intervention Provide education and explanation of THRR including how the numbers were predicted and where they are located for reference       Expected Outcomes Short Term: Able to state/look up THRR;Short Term: Able to use daily as guideline for intensity in rehab;Long Term: Able to use THRR to govern intensity when exercising independently       Able to  check pulse independently Yes       Intervention Provide education and demonstration on how to check pulse in carotid and radial arteries.;Review the importance of being able to check your own pulse for safety during independent exercise       Expected Outcomes Short Term: Able to explain why pulse checking is important during independent exercise;Long Term: Able to check pulse independently and accurately       Understanding of Exercise Prescription Yes       Intervention Provide education, explanation, and written materials on patient's individual exercise prescription       Expected Outcomes Short Term: Able to explain program exercise prescription;Long Term: Able to explain home exercise prescription to exercise independently              Exercise Goals Re-Evaluation :   Discharge Exercise Prescription (Final Exercise Prescription Changes):  Exercise Prescription Changes - 08/17/20 1600      Response to Exercise   Blood Pressure (Admit) 116/62    Blood Pressure (Exercise) 152/64    Blood Pressure (Exit) 118/64    Heart Rate (Admit) 63 bpm    Heart Rate (Exercise) 101 bpm    Heart Rate (Exit) 66 bpm    Oxygen Saturation (Admit) 96 %    Oxygen Saturation (Exercise) 98 %    Oxygen Saturation (Exit) 99 %    Rating of Perceived Exertion (Exercise) 13    Perceived Dyspnea (Exercise) 3    Symptoms SOB, dizziness (her usual)    Comments walk test results      Resistance Training   Weight 3 lb    Reps 10-15           Nutrition:  Target Goals: Understanding of nutrition guidelines, daily intake of sodium 1500mg , cholesterol 200mg , calories 30% from fat and 7% or less from saturated fats, daily to have 5 or more servings of fruits and vegetables.  Education: All About Nutrition: -Group instruction provided by verbal, written material, interactive activities, discussions, models, and posters to present general guidelines for heart healthy nutrition including fat, fiber, MyPlate,  the role of sodium in heart healthy nutrition, utilization of the nutrition label, and utilization of this knowledge for meal planning. Follow up email sent as well. Written material given at graduation.   Biometrics:  Pre Biometrics - 08/17/20 1639      Pre Biometrics   Height 5\' 5"  (1.651 m)    Weight 183 lb 1.6 oz (83.1 kg)  BMI (Calculated) 30.47    Single Leg Stand 2.44 seconds            Nutrition Therapy Plan and Nutrition Goals:   Nutrition Assessments:  MEDIFICTS Score Key:  ?70 Need to make dietary changes   40-70 Heart Healthy Diet  ? 40 Therapeutic Level Cholesterol Diet   Picture Your Plate Scores:  <27 Unhealthy dietary pattern with much room for improvement.  41-50 Dietary pattern unlikely to meet recommendations for good health and room for improvement.  51-60 More healthful dietary pattern, with some room for improvement.   >60 Healthy dietary pattern, although there may be some specific behaviors that could be improved.   Nutrition Goals Re-Evaluation:   Nutrition Goals Discharge (Final Nutrition Goals Re-Evaluation):   Psychosocial: Target Goals: Acknowledge presence or absence of significant depression and/or stress, maximize coping skills, provide positive support system. Participant is able to verbalize types and ability to use techniques and skills needed for reducing stress and depression.   Education: Stress, Anxiety, and Depression - Group verbal and visual presentation to define topics covered.  Reviews how body is impacted by stress, anxiety, and depression.  Also discusses healthy ways to reduce stress and to treat/manage anxiety and depression.  Written material given at graduation.   Education: Sleep Hygiene -Provides group verbal and written instruction about how sleep can affect your health.  Define sleep hygiene, discuss sleep cycles and impact of sleep habits. Review good sleep hygiene tips.    Initial Review &  Psychosocial Screening:  Initial Psych Review & Screening - 08/10/20 1344      Initial Review   Current issues with Current Psychotropic Meds;Current Depression;History of Depression;Current Anxiety/Panic;Current Sleep Concerns      Family Dynamics   Good Support System? Yes    Comments Zigmund Daniel states that she has three children and she has been concerned about them. One of her daughters is married to a control freak andis concerned about her. She has had skin cancer and 5 years of oncology. She could not take care of her new puppy when she was going through therapy and had a contract with the vet to hold on to her and adopted her puppy out. Zigmund Daniel can look to her friends and other two kids for support.      Barriers   Psychosocial barriers to participate in program The patient should benefit from training in stress management and relaxation.      Screening Interventions   Interventions Encouraged to exercise;To provide support and resources with identified psychosocial needs;Provide feedback about the scores to participant    Expected Outcomes Short Term goal: Utilizing psychosocial counselor, staff and physician to assist with identification of specific Stressors or current issues interfering with healing process. Setting desired goal for each stressor or current issue identified.;Long Term Goal: Stressors or current issues are controlled or eliminated.;Short Term goal: Identification and review with participant of any Quality of Life or Depression concerns found by scoring the questionnaire.;Long Term goal: The participant improves quality of Life and PHQ9 Scores as seen by post scores and/or verbalization of changes           Quality of Life Scores:  Scores of 19 and below usually indicate a poorer quality of life in these areas.  A difference of  2-3 points is a clinically meaningful difference.  A difference of 2-3 points in the total score of the Quality of Life Index has been associated with  significant improvement in overall quality of life,  self-image, physical symptoms, and general health in studies assessing change in quality of life.  PHQ-9: Recent Review Flowsheet Data    Depression screen [facility] 2/9 08/17/2020 08/10/2020 02/28/2020 11/01/2019 08/08/2019   Decreased Interest 1 1 0 0 0   Down, Depressed, Hopeless 1 0 0 0 1   PHQ - 2 Score 2 1 0 0 1   Altered sleeping 2 - 0 - -   Tired, decreased energy 1 - 0 - -   Change in appetite 1 - 0 - -   Feeling bad or failure about yourself  1 - 0 - -   Trouble concentrating 0 - 0 - -   Moving slowly or fidgety/restless 0 - 0 - -   Suicidal thoughts 0 - 0 - -   PHQ-9 Score 7 - 0 - -   Difficult doing work/chores Not difficult at all - Not difficult at all - -     Interpretation of Total Score  Total Score Depression Severity:  1-4 = Minimal depression, 5-9 = Mild depression, 10-14 = Moderate depression, 15-19 = Moderately severe depression, 20-27 = Severe depression   Psychosocial Evaluation and Intervention:  Psychosocial Evaluation - 08/10/20 1348      Psychosocial Evaluation & Interventions   Interventions Encouraged to exercise with the program and follow exercise prescription;Stress management education;Relaxation education    Comments Zigmund Daniel states that she has three children and she has been concerned about them. One of her daughters is married to a control freak andis concerned about her. She has had skin cancer and 5 years of oncology. She could not take care of her new puppy when she was going through therapy and had a contract with the vet to hold on to her and adopted her puppy out. Zigmund Daniel can look to her friends and other two kids for support.    Expected Outcomes Short: Exercise regularly to support mental health and notify staff of any changes. Long: maintain mental health and well being through teaching of rehab or prescribed medications independently.    Continue Psychosocial Services  Follow up required by staff            Psychosocial Re-Evaluation:   Psychosocial Discharge (Final Psychosocial Re-Evaluation):   Education: Education Goals: Education classes will be provided on a weekly basis, covering required topics. Participant will state understanding/return demonstration of topics presented.  Learning Barriers/Preferences:  Learning Barriers/Preferences - 08/10/20 1341      Learning Barriers/Preferences   Learning Barriers None    Learning Preferences None           General Pulmonary Education Topics:  Infection Prevention: - Provides verbal and written material to individual with discussion of infection control including proper hand washing and proper equipment cleaning during exercise session. Flowsheet Row Pulmonary Rehab from 08/10/2020 in [facility] Cardiac and Pulmonary Rehab  Date 08/10/20  Educator [facility]  Instruction Review Code 1- Verbalizes Understanding      Falls Prevention: - Provides verbal and written material to individual with discussion of falls prevention and safety. Flowsheet Row Pulmonary Rehab from 08/10/2020 in [facility]  Date 08/10/20  Educator [facility]  Instruction Review Code 1- Verbalizes Understanding      Chronic Lung Disease Review: - Group verbal instruction with posters, models, PowerPoint presentations and videos,  to review new updates, new respiratory medications, new advancements in procedures and treatments. Providing information on websites and "800" numbers for continued self-education. Includes information about supplement oxygen, available portable oxygen systems,  continuous and intermittent flow rates, oxygen safety, concentrators, and Medicare reimbursement for oxygen. Explanation of Pulmonary Drugs, including class, frequency, complications, importance of spacers, rinsing mouth after steroid MDI's, and proper cleaning methods for nebulizers. Review of basic lung anatomy and physiology related to function, structure, and complications  of lung disease. Review of risk factors. Discussion about methods for diagnosing sleep apnea and types of masks and machines for OSA. Includes a review of the use of types of environmental controls: home humidity, furnaces, filters, dust mite/pet prevention, HEPA vacuums. Discussion about weather changes, air quality and the benefits of nasal washing. Instruction on Warning signs, infection symptoms, calling MD promptly, preventive modes, and value of vaccinations. Review of effective airway clearance, coughing and/or vibration techniques. Emphasizing that all should Create an Action Plan. Written material given at graduation.   AED/CPR: - Group verbal and written instruction with the use of models to demonstrate the basic use of the AED with the basic ABC's of resuscitation.    Anatomy and Cardiac Procedures: - Group verbal and visual presentation and models provide information about basic cardiac anatomy and function. Reviews the testing methods done to diagnose heart disease and the outcomes of the test results. Describes the treatment choices: Medical Management, Angioplasty, or Coronary Bypass Surgery for treating various heart conditions including Myocardial Infarction, Angina, Valve Disease, and Cardiac Arrhythmias.  Written material given at graduation.   Medication Safety: - Group verbal and visual instruction to review commonly prescribed medications for heart and lung disease. Reviews the medication, class of the drug, and side effects. Includes the steps to properly store meds and maintain the prescription regimen.  Written material given at graduation.   Other: -Provides group and verbal instruction on various topics (see comments)   Knowledge Questionnaire Score:  Knowledge Questionnaire Score - 08/17/20 1628      Knowledge Questionnaire Score   Pre Score 15/18: O2 sats, oxygen use            Core Components/Risk Factors/Patient Goals at Admission:  Personal Goals and  Risk Factors at Admission - 08/17/20 1653      Core Components/Risk Factors/Patient Goals on Admission    Weight Management Yes;Weight Loss    Intervention Weight Management: Develop a combined nutrition and exercise program designed to reach desired caloric intake, while maintaining appropriate intake of nutrient and fiber, sodium and fats, and appropriate energy expenditure required for the weight goal.;Weight Management: Provide education and appropriate resources to help participant work on and attain dietary goals.    Admit Weight 183 lb (83 kg)    Goal Weight: Short Term 178 lb (80.7 kg)    Goal Weight: Long Term 173 lb (78.5 kg)    Expected Outcomes Long Term: Adherence to nutrition and physical activity/exercise program aimed toward attainment of established weight goal;Short Term: Continue to assess and modify interventions until short term weight is achieved;Weight Loss: Understanding of general recommendations for a balanced deficit meal plan, which promotes 1-2 lb weight loss per week and includes a negative energy balance of [phone] kcal/d;Understanding recommendations for meals to include 15-35% energy as protein, 25-35% energy from fat, 35-60% energy from carbohydrates, less than 200mg  of dietary cholesterol, 20-35 gm of total fiber daily;Understanding of distribution of calorie intake throughout the day with the consumption of 4-5 meals/snacks    Hypertension Yes    Intervention Provide education on lifestyle modifcations including regular physical activity/exercise, weight management, moderate sodium restriction and increased consumption of fresh fruit, vegetables, and low fat dairy, alcohol  moderation, and smoking cessation.;Monitor prescription use compliance.    Expected Outcomes Short Term: Continued assessment and intervention until BP is < 140/52mm HG in hypertensive participants. < 130/37mm HG in hypertensive participants with diabetes, heart failure or chronic kidney  disease.;Long Term: Maintenance of blood pressure at goal levels.    Lipids Yes    Intervention Provide education and support for participant on nutrition & aerobic/resistive exercise along with prescribed medications to achieve LDL 70mg , HDL >40mg .    Expected Outcomes Short Term: Participant states understanding of desired cholesterol values and is compliant with medications prescribed. Participant is following exercise prescription and nutrition guidelines.;Long Term: Cholesterol controlled with medications as prescribed, with individualized exercise RX and with personalized nutrition plan. Value goals: LDL < 70mg , HDL > 40 mg.           Education:Diabetes - Individual verbal and written instruction to review signs/symptoms of diabetes, desired ranges of glucose level fasting, after meals and with exercise. Acknowledge that pre and post exercise glucose checks will be done for 3 sessions at entry of program.   Know Your Numbers and Heart Failure: - Group verbal and visual instruction to discuss disease risk factors for cardiac and pulmonary disease and treatment options.  Reviews associated critical values for Overweight/Obesity, Hypertension, Cholesterol, and Diabetes.  Discusses basics of heart failure: signs/symptoms and treatments.  Introduces Heart Failure Zone chart for action plan for heart failure.  Written material given at graduation.   Core Components/Risk Factors/Patient Goals Review:    Core Components/Risk Factors/Patient Goals at Discharge (Final Review):    ITP Comments:  ITP Comments    Row Name 08/10/20 1340 08/17/20 1626 08/27/20 1444 09/09/20 0621     ITP Comments Virtual Visit completed. Patient informed on EP and RD appointment and 6 Minute walk test. Patient also informed of patient health questionnaires on My Chart. Patient Verbalizes understanding. Visit diagnosis can be found in [facility] 07/21/2020. Completed 6MWT and gym orientation. Initial ITP created and sent for  review to Dr. Emily Filbert, Medical Director. Spoke with patient- she was not comfortable coming to Pulmonary Rehab today as she reports she has had worse episodes of lightheadedness and dizziness, and also is in talk with her doctor about possible lymphedema. Encouraged her to call her doctor as soon as we hung up to report her worsening symptoms. Advised not to drive and call [phone] if it is warranted. Will place patient on medical hold for rehab until she gets more information from her providers. Patient agreed with plan. 30 Day review completed. Medical Director ITP review done, changes made as directed, and signed approval by Medical Director.           Comments:

## 2020-09-10 ENCOUNTER — Encounter: Payer: Self-pay | Admitting: Internal Medicine

## 2020-09-10 ENCOUNTER — Other Ambulatory Visit: Payer: Self-pay

## 2020-09-10 ENCOUNTER — Ambulatory Visit (INDEPENDENT_AMBULATORY_CARE_PROVIDER_SITE_OTHER): Payer: PPO | Admitting: Internal Medicine

## 2020-09-10 ENCOUNTER — Ambulatory Visit (INDEPENDENT_AMBULATORY_CARE_PROVIDER_SITE_OTHER): Payer: PPO

## 2020-09-10 VITALS — BP 124/70 | HR 67 | Temp 97.9°F | Ht 66.0 in | Wt 190.2 lb

## 2020-09-10 DIAGNOSIS — I639 Cerebral infarction, unspecified: Secondary | ICD-10-CM

## 2020-09-10 DIAGNOSIS — K921 Melena: Secondary | ICD-10-CM | POA: Diagnosis not present

## 2020-09-10 DIAGNOSIS — R109 Unspecified abdominal pain: Secondary | ICD-10-CM | POA: Diagnosis not present

## 2020-09-10 DIAGNOSIS — R519 Headache, unspecified: Secondary | ICD-10-CM | POA: Diagnosis not present

## 2020-09-10 DIAGNOSIS — M549 Dorsalgia, unspecified: Secondary | ICD-10-CM

## 2020-09-10 DIAGNOSIS — I499 Cardiac arrhythmia, unspecified: Secondary | ICD-10-CM

## 2020-09-10 DIAGNOSIS — E049 Nontoxic goiter, unspecified: Secondary | ICD-10-CM | POA: Diagnosis not present

## 2020-09-10 DIAGNOSIS — M546 Pain in thoracic spine: Secondary | ICD-10-CM | POA: Diagnosis not present

## 2020-09-10 DIAGNOSIS — R1084 Generalized abdominal pain: Secondary | ICD-10-CM

## 2020-09-10 DIAGNOSIS — G8929 Other chronic pain: Secondary | ICD-10-CM

## 2020-09-10 LAB — POCT URINALYSIS DIPSTICK
Bilirubin, UA: NEGATIVE
Blood, UA: NEGATIVE
Glucose, UA: NEGATIVE
Ketones, UA: NEGATIVE
Leukocytes, UA: NEGATIVE
Nitrite, UA: NEGATIVE
Protein, UA: NEGATIVE
Spec Grav, UA: <=1.005 — AB (ref 1.010–1.025)
Urobilinogen, UA: 0.2 E.U./dL
pH, UA: 5 (ref 5.0–8.0)

## 2020-09-10 LAB — HEMOCCULT GUIAC POC 1CARD (OFFICE): Fecal Occult Blood, POC: NEGATIVE

## 2020-09-10 LAB — LACTIC ACID, PLASMA: Lactate, Ven: 13.9 mg/dL (ref 4.8–25.7)

## 2020-09-10 NOTE — Patient Instructions (Addendum)
Align probiotics/Renew/Culturelle  immodium as needed for diarrhea 2 mg every day with the probiotic     Lymphedema  Lymphedema is swelling that is caused by the abnormal collection of lymph in the tissues under the skin. Lymph is excess fluid from the tissues in your body that is removed through the lymphatic system. This system is part of your body's defense system (immune system) and includes lymph nodes and lymph vessels. The lymph vessels collect and carry the excess fluid, fats, proteins, and waste from the tissues of the body to the bloodstream. This system also works to clean and remove bacteria and waste products from the body. Lymphedema occurs when the lymphatic system is blocked. When the lymph vessels or lymph nodes are blocked or damaged, lymph does not drain properly. This causes an abnormal buildup of lymph, which leads to swelling in the affected area. This may include the trunk area, or an arm or leg. Lymphedema cannot be cured by medicines, but various methods can be used to help reduce the swelling. What are the causes? The cause of this condition depends on the type of lymphedema that you have.  Primary lymphedema is caused by the absence of lymph vessels or having abnormal lymph vessels at birth.  Secondary lymphedema occurs when lymph vessels are blocked or damaged. Secondary lymphedema is more common. Common causes of lymph vessel blockage include: ? Skin infection, such as cellulitis. ? Infection by parasites (filariasis). ? Injury. ? Radiation therapy. ? Cancer. ? Formation of scar tissue. ? Surgery. What are the signs or symptoms? Symptoms of this condition include:  Swelling of the arm or leg.  A heavy or tight feeling in the arm or leg.  Swelling of the feet, toes, or fingers. Shoes or rings may fit more tightly than before.  Redness of the skin over the affected area.  Limited movement of the affected limb.  Sensitivity to touch or discomfort in the  affected limb. How is this diagnosed? This condition may be diagnosed based on:  Your symptoms and medical history.  A physical exam.  Bioimpedance spectroscopy. In this test, painless electrical currents are used to measure fluid levels in your body.  Imaging tests, such as: ? MRI. ? CT scan. ? Duplex ultrasound. This test uses sound waves to produce images of the vessels and the blood flow on a screen. ? Lymphoscintigraphy. In this test, a low dose of a radioactive substance is injected to trace the flow of lymph through your lymph vessels. ? Lymphangiography. In this test, a contrast dye is injected into the lymph vessel to help show blockages. How is this treated? If an underlying condition is causing the lymphedema, that condition will be treated. For example, antibiotic medicines may be used to treat an infection. Treatment for this condition will depend on the cause of your lymphedema. Treatment may include:  Complete decongestive therapy (CDT). This is done by a certified lymphedema therapist to reduce fluid congestion. This therapy includes: ? Skin care. ? Compression wrapping of the affected area. ? Manual lymph drainage. This is a special massage technique that promotes lymph drainage out of a limb. ? Specific exercises. Certain exercises can help fluid move out of the affected limb.  Compression. Various methods may be used to apply pressure to the affected limb to reduce the swelling. They include: ? Wearing compression stockings or sleeves on the affected limb. ? Wrapping the affected limb with special bandages.  Surgery. This is usually done for severe cases only.  For example, surgery may be done if you have trouble moving the limb or if the swelling does not get better with other treatments.   Follow these instructions at home: Self-care  The affected area is more likely to become injured or infected. Take these steps to help prevent infection: ? Keep the affected  area clean and dry. ? Use approved creams or lotions to keep the skin moisturized. ? Protect your skin from cuts:  Use gloves while cooking or gardening.  Do not walk barefoot.  If you shave the affected area, use an Copy.  Do not wear tight clothes, shoes, or jewelry.  Eat a healthy diet that includes a lot of fruits and vegetables. Activity  Do exercises as told by your health care provider.  Do not sit with your legs crossed.  When possible, keep the affected limb raised (elevated) above the level of your heart.  Avoid carrying things with an arm that is affected by lymphedema. General instructions  Wear compression stockings or sleeves as told by your health care provider.  Note any changes in size of the affected limb. You may be instructed to take regular measurements and keep track of them.  Take over-the-counter and prescription medicines only as told by your health care provider.  If you were prescribed an antibiotic medicine, take or apply it as told by your health care provider. Do not stop using the antibiotic even if you start to feel better or if your condition improves.  Do not use heating pads or ice packs on the affected area.  Avoid having blood draws, IV insertions, or blood pressure checks on the affected limb.  Keep all follow-up visits. This is important. Contact a health care provider if you:  Continue to have swelling in your limb.  Have fluid leaking from the skin of your swollen limb.  Have a cut that does not heal.  Have redness or pain in the affected area.  Develop purplish spots, rash, blisters, or sores (lesions) on your affected limb. Get help right away if you:  Have new swelling in your limb that starts suddenly.  Have shortness of breath or chest pain.  Have a fever or chills. These symptoms may represent a serious problem that is an emergency. Do not wait to see if the symptoms will go away. Get medical help right  away. Call your local emergency services (911 in the [place] Do not drive yourself to the hospital. Summary  Lymphedema is swelling that is caused by the abnormal collection of lymph in the tissues under the skin.  Lymph is fluid from the tissues in your body that is removed through the lymphatic system. This system collects and carries excess fluid, fats, proteins, and wastes from the tissues of the body to the bloodstream.  Lymphedema causes swelling, pain, and redness in the affected area. This may include the trunk area, or an arm or leg.  Treatment for this condition may depend on the cause of your lymphedema. Treatment may include treating the underlying cause, complete decongestive therapy (CDT), compression methods, or surgery. This information is not intended to replace advice given to you by your health care provider. Make sure you discuss any questions you have with your health care provider. Document Revised: 04/29/2020 Document Reviewed: 04/29/2020 Elsevier Patient Education  2021 [place].  Loperamide tablets or capsules What is this medicine? LOPERAMIDE (loe PER a mide) is used to treat diarrhea. This medicine may be used for other purposes; ask  your health care provider or pharmacist if you have questions. COMMON BRAND NAME(S): Anti-Diarrheal, Imodium A-D, K-Pek II What should I tell my health care provider before I take this medicine? They need to know if you have any of these conditions:  a black or bloody stool  bacterial food poisoning  colitis or mucus in your stool  currently taking an antibiotic medication for an infection  fever  history of irregular heartbeat  liver disease  severe abdominal pain, swelling or bulging  an unusual or allergic reaction to loperamide, other medicines, foods, dyes, or preservatives  pregnant or trying to get pregnant  breast-feeding How should I use this medicine? Take this medicine by mouth with a glass of water.  Follow the directions on the prescription label. Take your doses at regular intervals. Do not take your medicine more often than directed. Talk to your pediatrician regarding the use of this medicine in children. Special care may be needed. Overdosage: If you think you have taken too much of this medicine contact a poison control center or emergency room at once. NOTE: This medicine is only for you. Do not share this medicine with others. What if I miss a dose? This does not apply. This medicine is not for regular use. Only take this medicine while you continue to have loose bowel movements. Do not take more medicine than recommended by the packaging label or by your healthcare professional. What may interact with this medicine? Do not take this medicine with any of the following medications:   alosetron This medicine may also interact with the following medications:  cimetidine  clarithromycin  erythromycin  gemfibrozil  itraconazole  ketoconazole  quinidine  quinine  ranitidine  ritonavir  saquinavir This list may not describe all possible interactions. Give your health care provider a list of all the medicines, herbs, non-prescription drugs, or dietary supplements you use. Also tell them if you smoke, drink alcohol, or use illegal drugs. Some items may interact with your medicine. What should I watch for while using this medicine? Do not take this medicine for more than 2 days without asking your doctor or health care professional. Do not use doses higher than those prescribed by your doctor or listed on the label. Check with your doctor or health care professional right away if you develop a fever, severe abdominal pain, swelling or bulging, or if you have have bloody/black diarrhea or stools. You may get drowsy or dizzy. Do not drive, use machinery, or do anything that needs mental alertness until you know how this medicine affects you. Do not stand or sit up quickly,  especially if you are an older patient. This reduces the risk of dizzy or fainting spells. Alcohol can increase possible drowsiness and dizziness. Avoid alcoholic drinks. Your mouth may get dry. Chewing sugarless gum or sucking hard candy, and drinking plenty of water may help. Contact your doctor if the problem does not go away or is severe. Drinking plenty of water can also help prevent dehydration that can occur with diarrhea. Elderly patients may have a more variable response to the effects of this medicine, and are more susceptible to the effects of dehydration. What side effects may I notice from receiving this medicine? Side effects that you should report to your doctor or health care professional as soon as possible:   allergic reactions like skin rash, itching or hives, swelling of the face, lips, or tongue  bloated, swollen feeling in your abdomen  blurred vision  loss  of appetite  signs and symptoms of a dangerous change in heartbeat or heart rhythm like chest pain; dizziness; fast or irregular heartbeat palpitations; feeling faint or lightheaded, falls; breathing problems  stomach pain Side effects that usually do not require medical attention (report to your doctor or health care professional if they continue or are bothersome):   constipation  drowsiness or dizziness  dry mouth  nausea, vomiting This list may not describe all possible side effects. Call your doctor for medical advice about side effects. You may report side effects to FDA at 1-800-FDA-1088. Where should I keep my medicine? Keep out of the reach of children. Store at room temperature between 15 and 25 degrees C (59 and 77 degrees F). Keep container tightly closed. Throw away any unused medicine after the expiration date. NOTE: This sheet is a summary. It may not cover all possible information. If you have questions about this medicine, talk to your doctor, pharmacist, or health care provider.  2021  Elsevier/Gold Standard (2017-09-25 11:30:48)

## 2020-09-10 NOTE — Progress Notes (Signed)
Chief Complaint  Patient presents with  . Abdominal Pain    RLQ abdominal pain (8/10). Also experiencing sharp pain on R side of head. Pt has history of stroke on R side.     F/u  1. RLQ ab pain like spasms sharp chronic and intermittent with change in bowel habits for years loose/constipation/blood in stools h/o hemorrhoids ongoing at times 8/10 pain in abdomen can barely move when having ab pain pain is intermittent. She also c/o red stool to fudge like  02/25/20 CT colonoscopy-[facility] IMPRESSION: 1. Suboptimal evaluation of the sigmoid, secondary to underdistention. No circumferential mass within this region. More proximally, no evidence of clinically significant colonic polyp or mass. 2. Small hiatal hernia. 3. Aortic Atherosclerosis (ICD10-I70.0).   2. Right sided ha will have pt f/u with neurology Dr. Posey Pronto [facility]. BP controlled today after reduction in meds and last visit she had hypotension. Of note reports fall 01/19/20  08/27/20 ct head  IMPRESSION: 1. No evidence of acute intracranial abnormality. 2. Remote right frontal infarct and chronic microvascular ischemic change.   3. Atypical chest pain est with cardiology h/o sob, irregular hr zio ordered and consider CT calcium score and Dr. Rockey Situ appt 10/19/20 BP controlled on norvasc 2.5 mg qd benicar 10 mg qd   4. Mid back pain will do Xray today 09/10/20 IMPRESSION: 1. Progressive endplate degenerative changes within the lower thoracic spine. 2. Progressive retrolisthesis at T11-12. 3. No acute or healing fractures. 4. Aortic atherosclerosis.    5. C/o goiter will do US thyroid to w/u    Review of Systems  Constitutional: Negative for weight loss.  HENT: Negative for hearing loss.   Eyes: Negative for blurred vision.  Respiratory: Positive for sputum production.   Cardiovascular: Positive for chest pain and palpitations.  Gastrointestinal: Positive for abdominal pain and blood in stool.  Musculoskeletal:  Positive for back pain and falls.  Skin: Negative for rash.  Neurological: Positive for headaches.  Psychiatric/Behavioral: Negative for memory loss.   Past Medical History:  Diagnosis Date  . Acid reflux   . Allergy   . Anemia   . Anxiety    in past  . Arthritis    all over  . Asthma   . Atypical moles   . Bronchitis   . Bronchospasm    reactive with perfume/cologne and smoke  . CAD (coronary artery disease)    patient denies on preop of 10/24   . Candida esophagitis ([place])   . Carotid artery calcification, bilateral 04/2016  . Cataract   . Cervical stenosis of spine   . Chronic insomnia   . Complication of anesthesia    shoulder surgery- not all the way asleep  . Depression    in past  . Diverticulitis   . Diverticulosis   . Dizziness   . Elevated liver enzymes   . Family history of adverse reaction to anesthesia    sister- has problems with nausea and vomiting   . Fatigue   . Fatty liver   . Generalized headache    migraines  . GERD (gastroesophageal reflux disease)   . History of chemotherapy    topical cream for h/o skin CA  . History of kidney stones   . History of neuropathy    FH Dr. Posey Pronto neurology  . History of vertigo   . Hx of migraines   . Hyperlipidemia   . Hypothyroidism   . Insomnia   . Irritable bowel syndrome   . LLQ  abdominal pain   . Melanoma ([place]) 2013  . Melanoma ([place])    x2 stg II melanoma  . Neuropathy, peripheral   . Peripheral neuropathy   . Pneumonia    hx of x 2  . PONV (postoperative nausea and vomiting)   . Pre-diabetes   . Retina disorder    left  . SCC (squamous cell carcinoma)    skin   . Stroke [facility])    ? TIA per Dr V per patient   . Thyroid disease   . Urine incontinence   . Vitamin D deficiency   . Weight gain    Past Surgical History:  Procedure Laterality Date  . BILATERAL SALPINGOOPHORECTOMY     ovarian cyst Dr. Ouida Sills and Dr. Alisia Ferrari 2008  . BREAST SURGERY     in 2014 bx   . BREAST SURGERY      augmentation with saline Dr. Hubbard Hartshorn 1995   . CHOLECYSTECTOMY N/A 01/28/2015   Procedure: LAPAROSCOPIC CHOLECYSTECTOMY WITH INTRAOPERATIVE CHOLANGIOGRAM;  Surgeon: Autumn Messing III, MD;  Location: [facility];  Service: General;  Laterality: N/A;  . CLOSED MANIPULATION SHOULDER     rt  . COLONOSCOPY WITH PROPOFOL N/A 11/20/2012   Procedure: COLONOSCOPY WITH PROPOFOL;  Surgeon: Garlan Fair, MD;  Location: [facility];  Service: Endoscopy;  Laterality: N/A;  . colonscopy     May 2014  . JOINT REPLACEMENT     RTK  . KNEE ARTHROSCOPY    . left knee miniscus tear repair    . MELANOMA EXCISION WITH SENTINEL LYMPH NODE BIOPSY  03/18/11   Back, nodes neg  . OOPHORECTOMY    . ROTATOR CUFF REPAIR     left  . SHOULDER SURGERY     right and left   . SKIN CANCER EXCISION     multiple  . TOTAL HIP ARTHROPLASTY    . TOTAL KNEE ARTHROPLASTY     right 2010 and left in 2018   . TOTAL KNEE ARTHROPLASTY Left 05/16/2017   Procedure: LEFT TOTAL KNEE ARTHROPLASTY;  Surgeon: Paralee Cancel, MD;  Location: [facility];  Service: Orthopedics;  Laterality: Left;  70 mins  . TUBAL LIGATION    . TUMOR REMOVAL     ovary   Family History  Problem Relation Age of Onset  . Hypertension Mother   . Stroke Mother   . Hyperlipidemia Mother   . Heart disease Father   . Diabetes Father   . Hypertension Father   . Hyperlipidemia Father   . Early death Father   . Diabetes Sister   . Heart disease Sister        heart murmur  . Arthritis Sister   . COPD Sister   . Hyperlipidemia Sister   . Hypertension Sister   . Pancreatic cancer Maternal Uncle   . Alzheimer's disease Paternal Aunt   . Colon cancer Maternal Grandfather   . Cancer Maternal Grandfather   . Early death Maternal Grandfather   . Alzheimer's disease Paternal Aunt   . Alzheimer's disease Paternal Aunt   . Colon polyps Sister        adenomous  . Arthritis Sister   . Heart disease Sister   . Hyperlipidemia Sister   . Hypertension Sister   .  Cancer Sister   . Heart disease Daughter   . Hyperlipidemia Daughter   . Hypertension Daughter   . Heart disease Son   . Hyperlipidemia Son   . Alcohol abuse Son   . Depression Son   .  Diabetes Son   . Stroke Maternal Grandmother   . Early death Paternal Grandmother   . Stroke Paternal Grandmother   . Cancer Other        2 paternal uncles and 1 maternal uncle colon cancer  . Alzheimer's disease Paternal Aunt    Social History   Socioeconomic History  . Marital status: Divorced    Spouse name: Not on file  . Number of children: 3  . Years of education: College  . Highest education level: Some college, no degree  Occupational History  . Occupation: Retired    Fish farm manager: RETIRED  Tobacco Use  . Smoking status: Never Smoker  . Smokeless tobacco: Never Used  Vaping Use  . Vaping Use: Never used  Substance and Sexual Activity  . Alcohol use: Yes    Alcohol/week: 1.0 standard drink    Types: 1 Cans of beer per week    Comment: once a week beer or wine  . Drug use: No  . Sexual activity: Not Currently  Other Topics Concern  . Not on file  Social History Narrative   Patient lives at home alone in a one story home.   Has 3 children (2 daughters and 1 son)    Retired from [employer]   Divorced since 1992    Caffeine Use: 1 cup daily and a soft drink occasionally    No guns    Wears seat belt     Lives with cat    Left handed   Social Determinants of Health   Financial Resource Strain: Not on file  Food Insecurity: Not on file  Transportation Needs: Not on file  Physical Activity: Not on file  Stress: Not on file  Social Connections: Not on file  Intimate Partner Violence: Not on file   Current Meds  Medication Sig  . albuterol (PROAIR HFA) 108 (90 Base) MCG/ACT inhaler Inhale 1-2 puffs into the lungs every 6 (six) hours as needed for wheezing or shortness of breath.  Marland Kitchen amLODipine (NORVASC) 2.5 MG tablet Take 1 tablet (2.5 mg total) by mouth daily.  In am with food  . Ascorbic Acid (VITAMIN C) 1000 MG tablet Take 1,000 mg by mouth daily.  Marland Kitchen aspirin 81 MG tablet TAKE 1 TABLET BY MOUTH EVERY DAY  . atorvastatin (LIPITOR) 20 MG tablet Take 1 tablet (20 mg total) by mouth daily.  . Calcium 600-200 MG-UNIT tablet Take 1 tablet daily by mouth.  . Cholecalciferol (VITAMIN D3) 2000 UNITS capsule Take 2,000 Units by mouth daily.  Marland Kitchen FOLIC ACID PO Take by mouth daily.  Marland Kitchen levothyroxine (SYNTHROID) 75 MCG tablet Take 1 tablet (75 mcg total) by mouth daily before breakfast.  . Magnesium 250 MG TABS Take 250 mg by mouth daily.  Marland Kitchen olmesartan (BENICAR) 20 MG tablet Take 0.5 tablets (10 mg total) by mouth daily. If BP>130/>80 take another 1/2 pill  . Omega-3 Fatty Acids (FISH OIL) 1000 MG CAPS Take 1,000 mg by mouth daily.   . sertraline (ZOLOFT) 25 MG tablet Take 1 tablet (25 mg total) by mouth daily.  . vitamin B-12 (CYANOCOBALAMIN) 1000 MCG tablet Take 2,000 mcg by mouth daily. 2 tabs  . Zinc 50 MG CAPS Take 50 mg by mouth daily.  . [DISCONTINUED] doxycycline (VIBRA-TABS) 100 MG tablet Take 1 tablet (100 mg total) by mouth 2 (two) times daily. With food x 7-10 days (Patient not taking: Reported on 09/18/2020)   Allergies  Allergen Reactions  . Epinephrine Other (See Comments)  and Anaphylaxis    Heart palpitations  . Penicillins Swelling and Other (See Comments)    Lip swelling Has patient had a PCN reaction causing immediate rash, facial/tongue/throat swelling, SOB or lightheadedness with hypotension: Yes Has patient had a PCN reaction causing severe rash involving mucus membranes or skin necrosis: No Has patient had a PCN reaction that required hospitalization No Has patient had a PCN reaction occurring within the last 10 years: No If all of the above answers are "NO", then may proceed with Cephalosporin use.  . Lidocaine Hcl Other (See Comments)  . Other     Black pepper- caused bronchial tubes to start closing   . Valium [Diazepam] Other (See  Comments)    Other reaction(s): Other (See Comments) Overly sensitive  sedation   Recent Results (from the past 2160 hour(s))  Novel Coronavirus, NAA ([facility])     Status: None   Collection Time: 08/06/20  5:18 PM   Specimen: Nasopharyngeal(NP) swabs in vial transport medium   Nasopharynge  Screenin  Result Value Ref Range   SARS-CoV-2, NAA Not Detected Not Detected    Comment: This nucleic acid amplification test was developed and its performance characteristics determined by [employer]. Nucleic acid amplification tests include RT-PCR and TMA. This test has not been FDA cleared or approved. This test has been authorized by FDA under an Emergency Use Authorization (EUA). This test is only authorized for the duration of time the declaration that circumstances exist justifying the authorization of the emergency use of in vitro diagnostic tests for detection of SARS-CoV-2 virus and/or diagnosis of COVID-19 infection under section 564(b)(1) of the Act, 21 U.S.C. 809XIP-3(A) (1), unless the authorization is terminated or revoked sooner. When diagnostic testing is negative, the possibility of a false negative result should be considered in the context of a patient's recent exposures and the presence of clinical signs and symptoms consistent with COVID-19. An individual without symptoms of COVID-19 and who is not shedding SARS-CoV-2 virus wo uld expect to have a negative (not detected) result in this assay.   SARS-COV-2, NAA 2 DAY TAT     Status: None   Collection Time: 08/06/20  5:18 PM   Nasopharynge  Screenin  Result Value Ref Range   SARS-CoV-2, NAA 2 DAY TAT Performed   Comprehensive metabolic panel     Status: Abnormal   Collection Time: 09/08/20  9:26 AM  Result Value Ref Range   Sodium 139 135 - 145 mEq/L   Potassium 4.6 3.5 - 5.1 mEq/L   Chloride 102 96 - 112 mEq/L   CO2 29 19 - 32 mEq/L   Glucose, Bld 129 (H) 70 - 99 mg/dL   BUN 24 (H) 6 - 23 mg/dL    Creatinine, Ser 1.02 0.40 - 1.20 mg/dL   Total Bilirubin 1.2 0.2 - 1.2 mg/dL   Alkaline Phosphatase 68 39 - 117 U/L   AST 13 0 - 37 U/L   ALT 11 0 - 35 U/L   Total Protein 6.5 6.0 - 8.3 g/dL   Albumin 4.3 3.5 - 5.2 g/dL   GFR 52.24 (L) >60.00 mL/min    Comment: Calculated using the CKD-EPI Creatinine Equation (2021)   Calcium 9.5 8.4 - 10.5 mg/dL  Lactic acid, plasma     Status: None   Collection Time: 09/08/20  9:26 AM  Result Value Ref Range   Lactate, Ven 13.9 4.8 - 25.7 mg/dL  CBC with Differential/Platelet     Status: None   Collection Time: 09/08/20  9:26 AM  Result Value Ref Range   WBC 5.8 4.0 - 10.5 K/uL   RBC 4.45 3.87 - 5.11 Mil/uL   Hemoglobin 13.2 12.0 - 15.0 g/dL   HCT 39.7 36.0 - 46.0 %   MCV 89.1 78.0 - 100.0 fl   MCHC 33.2 30.0 - 36.0 g/dL   RDW 13.7 11.5 - 15.5 %   Platelets 205.0 150.0 - 400.0 K/uL   Neutrophils Relative % 58.7 43.0 - 77.0 %   Lymphocytes Relative 29.9 12.0 - 46.0 %   Monocytes Relative 8.1 3.0 - 12.0 %   Eosinophils Relative 2.8 0.0 - 5.0 %   Basophils Relative 0.5 0.0 - 3.0 %   Neutro Abs 3.4 1.4 - 7.7 K/uL   Lymphs Abs 1.7 0.7 - 4.0 K/uL   Monocytes Absolute 0.5 0.1 - 1.0 K/uL   Eosinophils Absolute 0.2 0.0 - 0.7 K/uL   Basophils Absolute 0.0 0.0 - 0.1 K/uL  POCT Urinalysis Dipstick     Status: Abnormal   Collection Time: 09/10/20  2:40 PM  Result Value Ref Range   Color, UA yellow    Clarity, UA clear    Glucose, UA Negative Negative   Bilirubin, UA Neg    Ketones, UA Neg    Spec Grav, UA <=1.005 (A) 1.010 - 1.025   Blood, UA Neg    pH, UA 5.0 5.0 - 8.0   Protein, UA Negative Negative   Urobilinogen, UA 0.2 0.2 or 1.0 E.U./dL   Nitrite, UA Neg    Leukocytes, UA Negative Negative   Appearance     Odor    Urine Culture     Status: None   Collection Time: 09/10/20  3:01 PM   Specimen: Urine   Urine  Result Value Ref Range   Urine Culture, Routine Final report    Organism ID, Bacteria Comment     Comment: Mixed urogenital  flora Less than 10,000 colonies/mL   POCT occult blood stool     Status: Normal   Collection Time: 09/10/20  4:00 PM  Result Value Ref Range   Fecal Occult Blood, POC Negative Negative   Card #1 Date     Card #2 Fecal Occult Blod, POC     Card #2 Date     Card #3 Fecal Occult Blood, POC     Card #3 Date    TSH     Status: None   Collection Time: 09/18/20  9:52 AM  Result Value Ref Range   TSH 1.540 0.350 - 4.500 uIU/mL    Comment: Performed by a 3rd Generation assay with a functional sensitivity of <=0.01 uIU/mL. Performed at [facility], [address] [place], [place] [address]   Basic metabolic panel     Status: Abnormal   Collection Time: 09/18/20  9:52 AM  Result Value Ref Range   Sodium 141 135 - 145 mmol/L   Potassium 4.0 3.5 - 5.1 mmol/L   Chloride 107 98 - 111 mmol/L   CO2 24 22 - 32 mmol/L   Glucose, Bld 110 (H) 70 - 99 mg/dL    Comment: Glucose reference range applies only to samples taken after fasting for at least 8 hours.   BUN 19 8 - 23 mg/dL   Creatinine, Ser 0.81 0.44 - 1.00 mg/dL   Calcium 9.2 8.9 - 10.3 mg/dL   GFR, Estimated >60 >60 mL/min    Comment: (NOTE) Calculated using the CKD-EPI Creatinine Equation (2021)    Anion gap 10 5 -  15    Comment: Performed at [facility], [address] [place], [place] [address]   Objective  Body mass index is 30.71 kg/m. Wt Readings from Last 3 Encounters:  09/18/20 190 lb 2 oz (86.2 kg)  09/10/20 190 lb 4 oz (86.3 kg)  09/04/20 190 lb (86.2 kg)   Temp Readings from Last 3 Encounters:  09/10/20 97.9 F (36.6 C)  08/21/20 98.5 F (36.9 C)  05/07/20 98.5 F (36.9 C) (Temporal)   BP Readings from Last 3 Encounters:  09/18/20 122/64  09/10/20 124/70  09/04/20 135/77   Pulse Readings from Last 3 Encounters:  09/18/20 64  09/10/20 67  09/04/20 79    Physical Exam Vitals and nursing note reviewed.  Constitutional:      Appearance: Normal appearance. She is well-developed and  well-groomed. She is obese.  HENT:     Head: Normocephalic and atraumatic.  Eyes:     Conjunctiva/sclera: Conjunctivae normal.     Pupils: Pupils are equal, round, and reactive to light.  Cardiovascular:     Rate and Rhythm: Normal rate and regular rhythm.     Heart sounds: Normal heart sounds.  Abdominal:     Tenderness: There is no abdominal tenderness.  Genitourinary:    Rectum: Guaiac result negative. External hemorrhoid present.  Skin:    General: Skin is warm and moist.  Neurological:     General: No focal deficit present.     Mental Status: She is alert and oriented to person, place, and time.     Gait: Gait normal.  Psychiatric:        Attention and Perception: Attention and perception normal.        Mood and Affect: Mood and affect normal.        Speech: Speech normal.        Behavior: Behavior normal. Behavior is cooperative.        Thought Content: Thought content normal.        Cognition and Memory: Cognition and memory normal.        Judgment: Judgment normal.     Assessment  Plan  Generalized abdominal pain - Plan: POCT Urinalysis Dipstick, Urine Culture neg uti, fobt negative today ? Etiology IBS mixed with chronic change in bowel habits and loose stools  CT colonoscopy 02/2020 not concerning small hiatal hernia/aortic atherosclerosis -will disc with GI if they could consider bentyl or levsin for sxs which have already been worked up  Consider GI f/u    Chronic nonintractable headache, unspecified headache type H/o stroke  Pt has neurology Dr. Posey Pronto and is following seen 09/04/20   Irregular heartbeat/sob with exertion ? due to chronic lung scarring/fibrosis note ct chest 01/21/2019 - Plan: Ambulatory referral to Cardiology will do zio consider CT calcium score in future  appt with Dr. Rockey Situ 10/19/20   Cerebrovascular accident (CVA), unspecified mechanism ([place]) - Plan: Ambulatory referral to Cardiology She f/u with Dr. Posey Pronto neurology [place]  Goiter - Plan:  US THYROID  Mid back pain - Plan: DG Thoracic Spine 2 View IMPRESSION: 1. Progressive endplate degenerative changes within the lower thoracic spine. 2. Progressive retrolisthesis at T11-12. 3. No acute or healing fractures. 4. Aortic atherosclerosis. -->consider PT in the future and epidural injections if needed   HM Flu shotutd Tdap-had TD 10/18/12 not pertussis will need this in future shingrixwill need if has not had zostervax had 06/16/11 prevnarhad 05/15/14 covid 3/3 vaccines utdpfizerconsider booster  Pna 23had 12/16/05, 05/18/16  Pap out of  age window LMP [facility] with implants7/6/21normal -10/18/16 screening with implants neg FH breast cancer p cousins dx'ed in 48s x 32, sister Had at age 34 y.o and colon cancer age 15, 2 paternal cousins ovarian cancer age 14 and 78  01/21/20 negative ordered [facility] for 01/20/21   Colonoscopy 11/2012 tortuous colon no further rec. -she is established and f/u GI Dr. Farrel Gordon [place] virtual coloscopyhad 02/25/20 -[facility] 02/25/20 CT colonoscopy-[facility] GI IMPRESSION: 1. Suboptimal evaluation of the sigmoid, secondary to underdistention. No circumferential mass within this region. More proximally, no evidence of clinically significant colonic polyp or mass. 2. Small hiatal hernia. 3. Aortic Atherosclerosis (ICD10-I70.0).   DEXA [facility] PCP 06/28/16 normal reviewed report did not scan into chart -h/o vitamin D def on D3 2000 IU daily  [facility] Dr. Delman Cheadle mohs surgery F/u from 04/10/19 left lower leg cellulitis for SCC changed to levaquin rec compression socks and acidic acid soaks qhs   05/15/19 f/u appt s/p SCC mohs removal f/u in 3 months Dr. Winifred Olive  Echo 01/07/20  FINDINGS  Left Ventricle: Left ventricular ejection fraction, by estimation, is 60  to 65%. The left ventricle has normal function. The left ventricle has no  regional wall motion abnormalities. The left  ventricular internal cavity  size was normal in size. There is  borderline left ventricular hypertrophy. Left ventricular diastolic  parameters are consistent with age-related delayed relaxation (normal).   Right Ventricle: The right ventricular size is normal. No increase in  right ventricular wall thickness. Right ventricular systolic function is  normal. There is normal pulmonary artery systolic pressure. The tricuspid  regurgitant velocity is 2.46 m/s, and  with an assumed right atrial pressure of 3 mmHg, the estimated right  ventricular systolic pressure is 96.7 mmHg.   Left Atrium: Left atrial size was normal in size.   Right Atrium: Right atrial size was normal in size.   Pericardium: There is no evidence of pericardial effusion.   Mitral Valve: The mitral valve is degenerative in appearance. There is  mild thickening of the mitral valve leaflet(s). Mild mitral valve  regurgitation. No evidence of mitral valve stenosis. MV peak gradient, 3.7  mmHg. The mean mitral valve gradient is  1.0 mmHg.   Tricuspid Valve: The tricuspid valve is normal in structure. Tricuspid  valve regurgitation is mild to moderate.   Aortic Valve: The aortic valve is tricuspid. Aortic valve regurgitation is  not visualized. No aortic stenosis is present. Aortic valve mean gradient  measures 4.0 mmHg. Aortic valve peak gradient measures 9.1 mmHg. Aortic  valve area, by VTI measures 2.19  cm.   Pulmonic Valve: The pulmonic valve was not well visualized. Pulmonic valve  regurgitation is mild.   Aorta: The aortic root is normal in size and structure.   Pulmonary Artery: The pulmonary artery is not well seen.   Venous: The inferior vena cava is normal in size with greater than 50%  respiratory variability, suggesting right atrial pressure of 3 mmHg.   IAS/Shunts: The interatrial septum was not well visualized.   [facility] saw 01/01/19 increased nocturia and urgency pending Botox to tx  and UDS Dr. Junious Silk and try myrbetriq before UDS  Cards-Dr. Rockey Situ le[facility] GI [facility] GIDr. Moss Mc MD Dr. Cordelia Pen [facility]-retinal specialist  Dentist Dr. Lynelle Smoke  Neurology-[facility] Neu[facility] Dr. Posey Pronto  ENT-Dr. Tami Ribas for vestibular balance issue s Podiatry Dr. Milinda Pointer  H/o Dr. Earlie Server [facility] Lung-Dr. Silvana Newness Dr. Alvan Dame  Surgery  Dr.Toth  Dermatology Dr. Delman Cheadle in [place] h/o SCC left leg and MM right leg,neck and backwith node dissection Dr. Marlou Starks 8/12  Former PCP Dr. Jossie Ng  -pt brought in all records week of 09/21/2018 and reviewed  -h/o iron def anemia  -last seen 07/09/18 and 08/09/18 possible thrush and URI  US carotid 05/13/16 <50% stenosis in right and left ICA -she was at this time put on lipitor 10 mg qhs   CT ab/pelvis 07/26/17 left base scarring lung, well circumcised liver lesion likely cysts and stable hepatomegaly 19.4 cm cranlocaudal pancreas/spleen normal, extensive colonic diverticulosis, aortic and branch vessell atherosclerosis no adenopathy, normal uterus and adnexa, advanced LS spondylosis grade 1 L4/5 and L5/S1 anterolisthesis are similar, pelvic floor laxity, hiatal hernia  CXR 02/26/18 thoracic aorta mildly atherosclerotic unchanged, Deg changes T spine, Deg changes b/l shoulders prior right shoulder surgery calcification distal left supraspinatus tendon at its insertion on the great tuberosity of the left humerus    Provider: Dr. Olivia Mackie McLean-Scocuzza-Internal Medicine

## 2020-09-12 LAB — URINE CULTURE

## 2020-09-14 ENCOUNTER — Telehealth: Payer: Self-pay

## 2020-09-14 NOTE — Telephone Encounter (Signed)
Spoke with patient regarding attendance with pulmonary rehab. Her doctor adjusted her BP medications and she has been feeling better. She is now told she is going to get a heart monitor to see if she has an irregularities. Patient states she prefers to hold off on rehab until she gets everything figured out. Told patient we could hold for a little longer, and depending how long she has the monitor for, we may have to discharge patient and get new referral from doctor when patient feels ready to start. Patient will call us back and let us know once she gets her appointment to get her heart monitor.

## 2020-09-18 ENCOUNTER — Ambulatory Visit: Payer: PPO | Admitting: Medical

## 2020-09-18 ENCOUNTER — Other Ambulatory Visit
Admission: RE | Admit: 2020-09-18 | Discharge: 2020-09-18 | Disposition: A | Discharge status: Home / Self Care | Payer: PPO | Attending: Physician Assistant | Admitting: Physician Assistant

## 2020-09-18 ENCOUNTER — Other Ambulatory Visit: Payer: Self-pay

## 2020-09-18 ENCOUNTER — Ambulatory Visit: Payer: PPO

## 2020-09-18 ENCOUNTER — Ambulatory Visit (INDEPENDENT_AMBULATORY_CARE_PROVIDER_SITE_OTHER): Payer: PPO

## 2020-09-18 ENCOUNTER — Encounter: Payer: Self-pay | Admitting: Physician Assistant

## 2020-09-18 VITALS — BP 122/64 | HR 64 | Ht 66.0 in | Wt 190.1 lb

## 2020-09-18 DIAGNOSIS — I499 Cardiac arrhythmia, unspecified: Secondary | ICD-10-CM

## 2020-09-18 DIAGNOSIS — R0609 Other forms of dyspnea: Secondary | ICD-10-CM

## 2020-09-18 DIAGNOSIS — I2510 Atherosclerotic heart disease of native coronary artery without angina pectoris: Secondary | ICD-10-CM

## 2020-09-18 DIAGNOSIS — I739 Peripheral vascular disease, unspecified: Secondary | ICD-10-CM

## 2020-09-18 DIAGNOSIS — I10 Essential (primary) hypertension: Secondary | ICD-10-CM

## 2020-09-18 DIAGNOSIS — R0600 Dyspnea, unspecified: Secondary | ICD-10-CM | POA: Diagnosis not present

## 2020-09-18 LAB — BASIC METABOLIC PANEL
Anion gap: 10 (ref 5–15)
BUN: 19 mg/dL (ref 8–23)
CO2: 24 mmol/L (ref 22–32)
Calcium: 9.2 mg/dL (ref 8.9–10.3)
Chloride: 107 mmol/L (ref 98–111)
Creatinine, Ser: 0.81 mg/dL (ref 0.44–1.00)
GFR, Estimated: >60 mL/min (ref >60)
Glucose, Bld: 110 mg/dL — ABNORMAL HIGH (ref 70–99)
Potassium: 4 mmol/L (ref 3.5–5.1)
Sodium: 141 mmol/L (ref 135–145)

## 2020-09-18 LAB — TSH: TSH: 1.54 u[IU]/mL (ref 0.350–4.500)

## 2020-09-18 NOTE — Patient Instructions (Signed)
Medication Instructions:  Your physician recommends that you continue on your current medications as directed. Please refer to the Current Medication list given to you today.  *If you need a refill on your cardiac medications before your next appointment, please call your pharmacy*   Lab Work: Your physician recommends that you have lab work TODAY at the medical mall at [facility]: Bmet, TSH -  Please go to the [facility]. You will check in at the front desk to the right as you walk into the atrium.    If you have labs (blood work) drawn today and your tests are completely normal, you will receive your results only by: Marland Kitchen MyChart Message (if you have MyChart) OR . A paper copy in the mail If you have any lab test that is abnormal or we need to change your treatment, we will call you to review the results.   Testing/Procedures: Your physician has recommended that you wear a Zio monitor. This monitor is a medical device that records the heart's electrical activity. Doctors most often use these monitors to diagnose arrhythmias. Arrhythmias are problems with the speed or rhythm of the heartbeat. The monitor is a small device applied to your chest. You can wear one while you do your normal daily activities. While wearing this monitor if you have any symptoms to push the button and record what you felt. Once you have worn this monitor for the period of time provider prescribed (Usually 14 days), you will return the monitor device in the postage paid box. Once it is returned they will download the data collected and provide [name] with a report which the provider will then review and we will call you with those results. Important tips:  1. Avoid showering during the first 24 hours of wearing the monitor. 2. Avoid excessive sweating to help maximize wear time. 3. Do not submerge the device, no hot tubs, and no swimming pools. 4. Keep any lotions or oils away from the patch. 5. After 24 hours you may  shower with the patch on. Take brief showers with your back facing the shower head.  6. Do not remove patch once it has been placed because that will interrupt data and decrease adhesive wear time. 7. Push the button when you have any symptoms and write down what you were feeling. 8. Once you have completed wearing your monitor, remove and place into box which has postage paid and place in your outgoing mailbox.  9. If for some reason you have misplaced your box then call our office and we can provide another box and/or mail it off for you.         Follow-Up: At [facility], you and your health needs are our priority.  As part of our continuing mission to provide you with exceptional heart care, we have created designated Provider Care Teams.  These Care Teams include your primary Cardiologist (physician) and Advanced Practice Providers (APPs -  Physician Assistants and Nurse Practitioners) who all work together to provide you with the care you need, when you need it.  We recommend signing up for the patient portal called "MyChart".  Sign up information is provided on this After Visit Summary.  MyChart is used to connect with patients for Virtual Visits (Telemedicine).  Patients are able to view lab/test results, encounter notes, upcoming appointments, etc.  Non-urgent messages can be sent to your provider as well.   To learn more about what you can do with MyChart, go  to [email or web address].    Your next appointment:   5 - 6 week(s)  The format for your next appointment:   In Person  Provider:   You may see Dr. Rockey Situ or one of the following Advanced Practice Providers on your designated Care Team:    Murray Hodgkins, NP  Christell Faith, PA-C  Marrianne Mood, PA-C  Cadence [place], [place]  Laurann Montana, NP

## 2020-09-18 NOTE — Progress Notes (Addendum)
Cardiology Office Note:    Date:  09/18/2020   ID:  GREGG HOLSTER, DOB [birth year], MRN [id number]  PCP:  McLean-Scocuzza, Nino Glow, MD  [facility] Cardiologist: [place] Electrophysiologist:  None   Referring MD: McLean-Scocuzza, Olivia Mackie *   Chief Complaint: Irregular heart rhythm  History of Present Illness:    [name] is a 80 y.o. female with a hx of CAD and aortic atherosclerosis on CT scan 01/2019, mild carotid disease, hyperlipidemia, morbid obesity, syncope, status post right TKR, melanoma, chronic shortness of breath who is being seen for irregular heart rhythm.  CT 02/04/2019 showed aortic atherosclerosis, diffuse coronary artery calcifications, particularly dense within the LAD.  Echo January 07, 2020 showed normal EF, normal right heart pressures, normal RV function, mild to moderate TR.  Patient had a Myoview Lexiscan February 20, 2020 that was low risk, normal with EF 55 to 60%.  Patient was last seen 07/20/2020 with continued chronic shortness of breath on exertion, rehab was recommended.  Today, the patient reports iiregular heart rhythm. Three times the blood pressure cuff has reported irregulat heart rhythm, the most recent was 2-3 weeks ago. She mentioned it to her primary care who recommended she see cardiology. She denies palpitations, and doesn't feeling her heart skipping a beat. She has been experiencing dizziness, lightheadedness, and SOB. Dizziness and lightheadedness has been going on for a year. SOB has been chronic for 2-3 years, and feels like it has been getting worse. She has seen pulmonology in the past, sob possible from fibrosis. She occasionally has chest discomfort, however it is fleeting and very rare. Not worse with exertion. Might be gas. She has chronic LLE, which has been relatively stable. She was recently sent to vascular for possible lymphedema.   BP is higher today. BP at home is generally 130s/60s. If SBP is high she takes an extra  half tablet of Benicar. We will re-check BP.   Past Medical History:  Diagnosis Date  . Acid reflux   . Allergy   . Anemia   . Anxiety    in past  . Arthritis    all over  . Asthma   . Atypical moles   . Bronchitis   . Bronchospasm    reactive with perfume/cologne and smoke  . CAD (coronary artery disease)    patient denies on preop of 10/24   . Candida esophagitis ([place])   . Carotid artery calcification, bilateral 04/2016  . Cataract   . Cervical stenosis of spine   . Chronic insomnia   . Complication of anesthesia    shoulder surgery- not all the way asleep  . Depression    in past  . Diverticulitis   . Diverticulosis   . Dizziness   . Elevated liver enzymes   . Family history of adverse reaction to anesthesia    sister- has problems with nausea and vomiting   . Fatigue   . Fatty liver   . Generalized headache    migraines  . GERD (gastroesophageal reflux disease)   . History of chemotherapy    topical cream for h/o skin CA  . History of kidney stones   . History of neuropathy    FH Dr. Posey Pronto neurology  . History of vertigo   . Hx of migraines   . Hyperlipidemia   . Hypothyroidism   . Insomnia   . Irritable bowel syndrome   . LLQ abdominal pain   . Melanoma ([place]) 2013  . Melanoma (  [place])    x2 stg II melanoma  . Neuropathy, peripheral   . Peripheral neuropathy   . Pneumonia    hx of x 2  . PONV (postoperative nausea and vomiting)   . Pre-diabetes   . Retina disorder    left  . SCC (squamous cell carcinoma)    skin   . Stroke [facility])    ? TIA per Dr V per patient   . Thyroid disease   . Urine incontinence   . Vitamin D deficiency   . Weight gain     Past Surgical History:  Procedure Laterality Date  . BILATERAL SALPINGOOPHORECTOMY     ovarian cyst Dr. Ouida Sills and Dr. Alisia Ferrari 2008  . BREAST SURGERY     in 2014 bx   . BREAST SURGERY     augmentation with saline Dr. Hubbard Hartshorn 1995   . CHOLECYSTECTOMY N/A 01/28/2015   Procedure: LAPAROSCOPIC  CHOLECYSTECTOMY WITH INTRAOPERATIVE CHOLANGIOGRAM;  Surgeon: Autumn Messing III, MD;  Location: [place];  Service: General;  Laterality: N/A;  . CLOSED MANIPULATION SHOULDER     rt  . COLONOSCOPY WITH PROPOFOL N/A 11/20/2012   Procedure: COLONOSCOPY WITH PROPOFOL;  Surgeon: Garlan Fair, MD;  Location: [facility];  Service: Endoscopy;  Laterality: N/A;  . colonscopy     May 2014  . JOINT REPLACEMENT     RTK  . KNEE ARTHROSCOPY    . left knee miniscus tear repair    . MELANOMA EXCISION WITH SENTINEL LYMPH NODE BIOPSY  03/18/11   Back, nodes neg  . OOPHORECTOMY    . ROTATOR CUFF REPAIR     left  . SHOULDER SURGERY     right and left   . SKIN CANCER EXCISION     multiple  . TOTAL HIP ARTHROPLASTY    . TOTAL KNEE ARTHROPLASTY     right 2010 and left in 2018   . TOTAL KNEE ARTHROPLASTY Left 05/16/2017   Procedure: LEFT TOTAL KNEE ARTHROPLASTY;  Surgeon: Paralee Cancel, MD;  Location: [facility];  Service: Orthopedics;  Laterality: Left;  70 mins  . TUBAL LIGATION    . TUMOR REMOVAL     ovary    Current Medications: Current Meds  Medication Sig  . albuterol (PROAIR HFA) 108 (90 Base) MCG/ACT inhaler Inhale 1-2 puffs into the lungs every 6 (six) hours as needed for wheezing or shortness of breath.  Marland Kitchen amLODipine (NORVASC) 2.5 MG tablet Take 1 tablet (2.5 mg total) by mouth daily. In am with food  . Ascorbic Acid (VITAMIN C) 1000 MG tablet Take 1,000 mg by mouth daily.  Marland Kitchen aspirin 81 MG tablet TAKE 1 TABLET BY MOUTH EVERY DAY  . atorvastatin (LIPITOR) 20 MG tablet Take 1 tablet (20 mg total) by mouth daily.  . Calcium 600-200 MG-UNIT tablet Take 1 tablet daily by mouth.  . Cholecalciferol (VITAMIN D3) 2000 UNITS capsule Take 2,000 Units by mouth daily.  Marland Kitchen FOLIC ACID PO Take by mouth daily.  Marland Kitchen levothyroxine (SYNTHROID) 75 MCG tablet Take 1 tablet (75 mcg total) by mouth daily before breakfast.  . Magnesium 250 MG TABS Take 250 mg by mouth daily.  Marland Kitchen olmesartan (BENICAR) 20 MG tablet Take 0.5  tablets (10 mg total) by mouth daily. If BP>130/>80 take another 1/2 pill  . Omega-3 Fatty Acids (FISH OIL) 1000 MG CAPS Take 1,000 mg by mouth daily.   Marland Kitchen omeprazole (PRILOSEC) 20 MG capsule Take 1 capsule (20 mg total) by mouth 2 (two) times daily  before a meal.  . sertraline (ZOLOFT) 25 MG tablet Take 1 tablet (25 mg total) by mouth daily.  . vitamin B-12 (CYANOCOBALAMIN) 1000 MCG tablet Take 2,000 mcg by mouth daily. 2 tabs  . Zinc 50 MG CAPS Take 50 mg by mouth daily.     Allergies:   Epinephrine, Penicillins, Lidocaine hcl, Other, and Valium [diazepam]   Social History   Socioeconomic History  . Marital status: Divorced    Spouse name: Not on file  . Number of children: 3  . Years of education: College  . Highest education level: Some college, no degree  Occupational History  . Occupation: Retired    Fish farm manager: RETIRED  Tobacco Use  . Smoking status: Never Smoker  . Smokeless tobacco: Never Used  Vaping Use  . Vaping Use: Never used  Substance and Sexual Activity  . Alcohol use: Yes    Alcohol/week: 1.0 standard drink    Types: 1 Cans of beer per week    Comment: once a week beer or wine  . Drug use: No  . Sexual activity: Not Currently  Other Topics Concern  . Not on file  Social History Narrative   Patient lives at home alone in a one story home.   Has 3 children (2 daughters and 1 son)    Retired from [employer]   Divorced since 1992    Caffeine Use: 1 cup daily and a soft drink occasionally    No guns    Wears seat belt     Lives with cat    Left handed   Social Determinants of Health   Financial Resource Strain: Not on file  Food Insecurity: Not on file  Transportation Needs: Not on file  Physical Activity: Not on file  Stress: Not on file  Social Connections: Not on file     Family History: The patient's family history includes Alcohol abuse in her son; Alzheimer's disease in her paternal aunt, paternal aunt, paternal aunt, and  paternal aunt; Arthritis in her sister and sister; COPD in her sister; Cancer in her maternal grandfather, sister, and another family member; Colon cancer in her maternal grandfather; Colon polyps in her sister; Depression in her son; Diabetes in her father, sister, and son; Early death in her father, maternal grandfather, and paternal grandmother; Heart disease in her daughter, father, sister, sister, and son; Hyperlipidemia in her daughter, father, mother, sister, sister, and son; Hypertension in her daughter, father, mother, sister, and sister; Pancreatic cancer in her maternal uncle; Stroke in her maternal grandmother, mother, and paternal grandmother.  ROS:   Please see the history of present illness.     All other systems reviewed and are negative.  EKGs/Labs/Other Studies Reviewed:    The following studies were reviewed today: Myoview Lexiscan 02/20/2020  There was no ST segment deviation noted during stress.  The study is normal.  This is a low risk study.  The left ventricular ejection fraction is normal (55-65%).  Echo 01/07/2020 1. Left ventricular ejection fraction, by estimation, is 60 to 65%. The  left ventricle has normal function. The left ventricle has no regional  wall motion abnormalities. Left ventricular diastolic parameters are  consistent with age-related delayed  relaxation (normal).  2. Right ventricular systolic function is normal. The right ventricular  size is normal. There is normal pulmonary artery systolic pressure.  3. The mitral valve is degenerative. Mild mitral valve regurgitation. No  evidence of mitral stenosis.  4. Tricuspid valve regurgitation is mild  to moderate.  5. The aortic valve is tricuspid. Aortic valve regurgitation is not  visualized. No aortic stenosis is present.  6. The inferior vena cava is normal in size with greater than 50%  respiratory variability, suggesting right atrial pressure of 3 mmHg.   EKG:  EKG is  ordered today.   The ekg ordered today demonstrates SR, 64 bpm, low voltage, nonspecific T wave changes  Recent Labs: 04/14/2020: TSH 3.22 09/08/2020: ALT 11; BUN 24; Creatinine, Ser 1.02; Hemoglobin 13.2; Platelets 205.0; Potassium 4.6; Sodium 139  Recent Lipid Panel    Component Value Date/Time   CHOL 142 03/25/2020 0830   TRIG 99.0 03/25/2020 0830   HDL 69.80 03/25/2020 0830   CHOLHDL 2 03/25/2020 0830   VLDL 19.8 03/25/2020 0830   LDLCALC 52 03/25/2020 0830    Physical Exam:    VS:  BP 122/64 (BP Location: Left Arm, Patient Position: Sitting, Cuff Size: Large)   Pulse 64   Ht 5\' 6"  (1.676 m)   Wt 190 lb 2 oz (86.2 kg)   SpO2 98%   BMI 30.69 kg/m     Wt Readings from Last 3 Encounters:  09/18/20 190 lb 2 oz (86.2 kg)  09/10/20 190 lb 4 oz (86.3 kg)  09/04/20 190 lb (86.2 kg)     GEN:  Well nourished, well developed in no acute distress HEENT: Normal NECK: No JVD; No carotid bruits LYMPHATICS: No lymphadenopathy CARDIAC: RRR, no murmurs, rubs, gallops RESPIRATORY:  Clear to auscultation without rales, wheezing or rhonchi  ABDOMEN: Soft, non-tender, non-distended MUSCULOSKELETAL:  No edema; No deformity  SKIN: Warm and dry NEUROLOGIC:  Alert and oriented x 3 PSYCHIATRIC:  Normal affect   ASSESSMENT:    1. Irregular heart rate   2. Dyspnea on exertion   3. Morbid obesity ([place])   4. Coronary artery calcification seen on CAT scan    PLAN:    In order of problems listed above:  Irregular heart rhythm Patient states that home BP cuff reports irregular heart rhythm, although she is asymptomatic. She has had chronic sob, lightheadedness, dizziness, however unsure if this is related. Heart rate in the 60s today. EKG shows SR with no arrhythmias. Echo 12/2019 showed normal LV function with no significant abnormality. I will order a 2 week heart monitor. I will check BMET and TSH. We will see the patient back after the heart monitor. Can consider repeat echo at follow-up.   Chronic  shortness of breath This has been slowly progressing for the last 2-3 years. She had a normal echo and myoview lexiscan last year. Has also been seen by pulmonary and regularly sees PCP. Suspected SOB is from fibrosis. No signs or symptoms of heart failure. She has chronic LLE, but this is unchanged and dependent and is seeing vascular later this month for possible lymphedema. She uses compression socks sometimes and elevates her legs. As above, can consider repeat echo if this worsens.   CAD by CT scan June 2020 Atypical chest pain Patient reports very rare episodes of chest discomfort, they are fleeting and non-exertional. Possible it is gas. She does have strong family history of CAD. She had normal echo and myoview last year. If chest discomfort continues can consider cardiac CT. Continue Aspirin and statin.   HTN Re-check of BP was much better, 122/64. At home it is generally 130s/60s. She takes amlodipine and Benicar. Per PCP instructions she takes an extra hlaf tab of Benicar if SBP> 130/80.   Disposition: Follow up  after heart monitor with APP    Signed, Cadence Arlyss Repress  09/18/2020 8:53 AM    [place]

## 2020-09-22 ENCOUNTER — Telehealth: Payer: Self-pay

## 2020-09-22 DIAGNOSIS — R0609 Other forms of dyspnea: Secondary | ICD-10-CM

## 2020-09-22 DIAGNOSIS — M549 Dorsalgia, unspecified: Secondary | ICD-10-CM | POA: Insufficient documentation

## 2020-09-22 DIAGNOSIS — R0600 Dyspnea, unspecified: Secondary | ICD-10-CM

## 2020-09-22 DIAGNOSIS — I639 Cerebral infarction, unspecified: Secondary | ICD-10-CM | POA: Insufficient documentation

## 2020-09-22 NOTE — Telephone Encounter (Signed)
Spoke with patient regarding rehab. She is getting a holter monitor for 2 weeks- and wants to wait until she gets those results back until she starts here. We will hold patient on medical hold but informed patient her time slot will be opened. Patient understands and will call us when she gets her results back. Next steps will be determined on her results/doctor orders going forward.

## 2020-09-24 ENCOUNTER — Telehealth: Payer: Self-pay | Admitting: Internal Medicine

## 2020-09-24 NOTE — Telephone Encounter (Signed)
FYI patient does not want to try Bentyl. She said that she has dealt with this intermittent pain for a long time. She just wanted to forget it was there. She also does not want to have a thyroid US. I  asked that if she changed her mind that she let us know. Pt stated that she would.

## 2020-09-24 NOTE — Telephone Encounter (Signed)
Patient was returning call about GI

## 2020-09-24 NOTE — Addendum Note (Signed)
Addended by: Orland Mustard on: 09/24/2020 03:08 PM   Modules accepted: Orders

## 2020-09-24 NOTE — Telephone Encounter (Signed)
LMTCB

## 2020-09-24 NOTE — Telephone Encounter (Signed)
Spoke to Dr. Havery Moros GI about abdominal pain/cramping does she want to try bentyl as needed for abdominal cramping ?

## 2020-09-29 ENCOUNTER — Other Ambulatory Visit: Payer: Self-pay | Admitting: Internal Medicine

## 2020-09-29 ENCOUNTER — Telehealth: Payer: Self-pay

## 2020-09-29 DIAGNOSIS — I10 Essential (primary) hypertension: Secondary | ICD-10-CM

## 2020-09-29 NOTE — Telephone Encounter (Signed)
Pt would like a colonoscopy referral. She states that provider already knows who and where to send her? Please advise

## 2020-09-29 NOTE — Telephone Encounter (Signed)
Sure thing. We have recommended colonoscopy in the past for her but she had not wanted to do it.  If she is now willing we can schedule it. [place] can you please call the patient and clarify if she wants colonoscopy or not, and if so, help with scheduling? Thanks

## 2020-09-29 NOTE — Telephone Encounter (Signed)
Lm on vm for patient to return call 

## 2020-09-29 NOTE — Telephone Encounter (Signed)
Not sure what is needed she is established with gi can you help

## 2020-09-30 NOTE — Telephone Encounter (Signed)
Lm on vm for patient to return call 

## 2020-10-01 NOTE — Telephone Encounter (Signed)
Patient has been scheduled for a pre-visit on Wednesday, 11/04/20 at 1 PM. Colonoscopy is scheduled in the [facility] with Dr. Havery Moros on Wednesday, 11/18/20 at 2:30 PM.

## 2020-10-06 ENCOUNTER — Encounter (INDEPENDENT_AMBULATORY_CARE_PROVIDER_SITE_OTHER): Payer: Self-pay | Admitting: Vascular Surgery

## 2020-10-06 ENCOUNTER — Other Ambulatory Visit: Payer: Self-pay

## 2020-10-06 ENCOUNTER — Ambulatory Visit (INDEPENDENT_AMBULATORY_CARE_PROVIDER_SITE_OTHER): Payer: PPO

## 2020-10-06 ENCOUNTER — Ambulatory Visit (INDEPENDENT_AMBULATORY_CARE_PROVIDER_SITE_OTHER): Payer: PPO | Admitting: Vascular Surgery

## 2020-10-06 ENCOUNTER — Other Ambulatory Visit (INDEPENDENT_AMBULATORY_CARE_PROVIDER_SITE_OTHER): Payer: Self-pay | Admitting: Vascular Surgery

## 2020-10-06 VITALS — BP 130/74 | HR 62 | Resp 16 | Ht 66.0 in | Wt 189.4 lb

## 2020-10-06 DIAGNOSIS — L03116 Cellulitis of left lower limb: Secondary | ICD-10-CM

## 2020-10-06 DIAGNOSIS — R600 Localized edema: Secondary | ICD-10-CM

## 2020-10-06 DIAGNOSIS — I83813 Varicose veins of bilateral lower extremities with pain: Secondary | ICD-10-CM | POA: Diagnosis not present

## 2020-10-06 DIAGNOSIS — I890 Lymphedema, not elsewhere classified: Secondary | ICD-10-CM

## 2020-10-06 DIAGNOSIS — I10 Essential (primary) hypertension: Secondary | ICD-10-CM | POA: Diagnosis not present

## 2020-10-06 DIAGNOSIS — I872 Venous insufficiency (chronic) (peripheral): Secondary | ICD-10-CM | POA: Insufficient documentation

## 2020-10-06 DIAGNOSIS — C4492 Squamous cell carcinoma of skin, unspecified: Secondary | ICD-10-CM | POA: Diagnosis not present

## 2020-10-06 DIAGNOSIS — I739 Peripheral vascular disease, unspecified: Secondary | ICD-10-CM | POA: Diagnosis not present

## 2020-10-06 NOTE — Assessment & Plan Note (Signed)
Multiple on both legs with multiple resections markedly increasing her risk of lymphedema in both legs.

## 2020-10-06 NOTE — Assessment & Plan Note (Signed)
blood pressure control important in reducing the progression of atherosclerotic disease. On appropriate oral medications.  

## 2020-10-06 NOTE — Assessment & Plan Note (Signed)
To evaluate her venous system, a reflux study was done today.  No DVT or superficial thrombophlebitis was seen.  She does have significant venous reflux in both great saphenous veins.  She has pain and swelling in both lower extremities and has already been wearing compression stockings and elevating her legs for many months.  This has not helped.  She does have a previous history of ulceration, going ahead and considering bilateral great saphenous vein laser ablation in a staged fashion would be very reasonable.  Risks and benefits were discussed with the patient.  She is agreeable to proceed.

## 2020-10-06 NOTE — Progress Notes (Signed)
Patient ID: [name], female   DOB: [birth year], 80 y.o.   MRN: [id number]  Chief Complaint  Patient presents with  . New Patient (Initial Visit)    Ref Aundra Dubin leg edema lymphedema,PAD,left le celluitis    HPI [name] is a 80 y.o. female.  I am asked to see the patient by Dr. Terese Door for evaluation of leg swelling and pain.  She has been having swelling now for several months and maybe years.  She denies any trauma, injury, or clear inciting events that caused the swelling.  She has had multiple skin cancers removed from both lower extremities more on the left than the right.  She also has a history of malignant melanoma.  She has been treated previously for cellulitis of the lower extremities.  No open wounds or infection at this point.  The left leg is the more severely affected of the 2 legs.  She does have a previous history of multiple ulcerations that were very slow to heal but does not currently have an ulceration.  To evaluate her venous system, a reflux study was done today.  No DVT or superficial thrombophlebitis was seen.  She does have significant venous reflux in both great saphenous veins.   Past Medical History:  Diagnosis Date  . Acid reflux   . Allergy   . Anemia   . Anxiety    in past  . Arthritis    all over  . Asthma   . Atypical moles   . Bronchitis   . Bronchospasm    reactive with perfume/cologne and smoke  . CAD (coronary artery disease)    patient denies on preop of 10/24   . Candida esophagitis ([place])   . Carotid artery calcification, bilateral 04/2016  . Cataract   . Cervical stenosis of spine   . Chronic insomnia   . Complication of anesthesia    shoulder surgery- not all the way asleep  . Depression    in past  . Diverticulitis   . Diverticulosis   . Dizziness   . Elevated liver enzymes   . Family history of adverse reaction to anesthesia    sister- has problems with nausea and vomiting   . Fatigue   . Fatty liver   .  Generalized headache    migraines  . GERD (gastroesophageal reflux disease)   . History of chemotherapy    topical cream for h/o skin CA  . History of kidney stones   . History of neuropathy    FH Dr. Posey Pronto neurology  . History of vertigo   . Hx of migraines   . Hyperlipidemia   . Hypothyroidism   . Insomnia   . Irritable bowel syndrome   . LLQ abdominal pain   . Melanoma ([place]) 2013  . Melanoma ([place])    x2 stg II melanoma  . Neuropathy, peripheral   . Peripheral neuropathy   . Pneumonia    hx of x 2  . PONV (postoperative nausea and vomiting)   . Pre-diabetes   . Retina disorder    left  . SCC (squamous cell carcinoma)    skin   . Stroke [facility])    ? TIA per Dr V per patient   . Thyroid disease   . Urine incontinence   . Vitamin D deficiency   . Weight gain     Past Surgical History:  Procedure Laterality Date  . BILATERAL SALPINGOOPHORECTOMY     ovarian cyst Dr.  Anderson and Dr. Alisia Ferrari 2008  . BREAST SURGERY     in 2014 bx   . BREAST SURGERY     augmentation with saline Dr. Hubbard Hartshorn 1995   . CHOLECYSTECTOMY N/A 01/28/2015   Procedure: LAPAROSCOPIC CHOLECYSTECTOMY WITH INTRAOPERATIVE CHOLANGIOGRAM;  Surgeon: Autumn Messing III, MD;  Location: [place];  Service: General;  Laterality: N/A;  . CLOSED MANIPULATION SHOULDER     rt  . COLONOSCOPY WITH PROPOFOL N/A 11/20/2012   Procedure: COLONOSCOPY WITH PROPOFOL;  Surgeon: Garlan Fair, MD;  Location: [facility];  Service: Endoscopy;  Laterality: N/A;  . colonscopy     May 2014  . JOINT REPLACEMENT     RTK  . KNEE ARTHROSCOPY    . left knee miniscus tear repair    . MELANOMA EXCISION WITH SENTINEL LYMPH NODE BIOPSY  03/18/11   Back, nodes neg  . OOPHORECTOMY    . ROTATOR CUFF REPAIR     left  . SHOULDER SURGERY     right and left   . SKIN CANCER EXCISION     multiple  . TOTAL HIP ARTHROPLASTY    . TOTAL KNEE ARTHROPLASTY     right 2010 and left in 2018   . TOTAL KNEE ARTHROPLASTY Left 05/16/2017    Procedure: LEFT TOTAL KNEE ARTHROPLASTY;  Surgeon: Paralee Cancel, MD;  Location: [facility];  Service: Orthopedics;  Laterality: Left;  70 mins  . TUBAL LIGATION    . TUMOR REMOVAL     ovary     Family History  Problem Relation Age of Onset  . Hypertension Mother   . Stroke Mother   . Hyperlipidemia Mother   . Heart disease Father   . Diabetes Father   . Hypertension Father   . Hyperlipidemia Father   . Early death Father   . Diabetes Sister   . Heart disease Sister        heart murmur  . Arthritis Sister   . COPD Sister   . Hyperlipidemia Sister   . Hypertension Sister   . Pancreatic cancer Maternal Uncle   . Alzheimer's disease Paternal Aunt   . Colon cancer Maternal Grandfather   . Cancer Maternal Grandfather   . Early death Maternal Grandfather   . Alzheimer's disease Paternal Aunt   . Alzheimer's disease Paternal Aunt   . Colon polyps Sister        adenomous  . Arthritis Sister   . Heart disease Sister   . Hyperlipidemia Sister   . Hypertension Sister   . Cancer Sister   . Heart disease Daughter   . Hyperlipidemia Daughter   . Hypertension Daughter   . Heart disease Son   . Hyperlipidemia Son   . Alcohol abuse Son   . Depression Son   . Diabetes Son   . Stroke Maternal Grandmother   . Early death Paternal Grandmother   . Stroke Paternal Grandmother   . Cancer Other        2 paternal uncles and 1 maternal uncle colon cancer  . Alzheimer's disease Paternal Aunt       Social History   Tobacco Use  . Smoking status: Never Smoker  . Smokeless tobacco: Never Used  Vaping Use  . Vaping Use: Never used  Substance Use Topics  . Alcohol use: Yes    Alcohol/week: 1.0 standard drink    Types: 1 Cans of beer per week    Comment: once a week beer or wine  . Drug use: No  Allergies  Allergen Reactions  . Epinephrine Other (See Comments) and Anaphylaxis    Heart palpitations  . Penicillins Swelling and Other (See Comments)    Lip swelling Has patient  had a PCN reaction causing immediate rash, facial/tongue/throat swelling, SOB or lightheadedness with hypotension: Yes Has patient had a PCN reaction causing severe rash involving mucus membranes or skin necrosis: No Has patient had a PCN reaction that required hospitalization No Has patient had a PCN reaction occurring within the last 10 years: No If all of the above answers are "NO", then may proceed with Cephalosporin use.  . Lidocaine Hcl Other (See Comments)  . Other     Black pepper- caused bronchial tubes to start closing   . Valium [Diazepam] Other (See Comments)    Other reaction(s): Other (See Comments) Overly sensitive  sedation    Current Outpatient Medications  Medication Sig Dispense Refill  . albuterol (PROAIR HFA) 108 (90 Base) MCG/ACT inhaler Inhale 1-2 puffs into the lungs every 6 (six) hours as needed for wheezing or shortness of breath. 18 g 11  . amLODipine (NORVASC) 2.5 MG tablet Take 1 tablet (2.5 mg total) by mouth daily. In am with food 90 tablet 3  . Ascorbic Acid (VITAMIN C) 1000 MG tablet Take 1,000 mg by mouth daily.    Marland Kitchen aspirin 81 MG tablet TAKE 1 TABLET BY MOUTH EVERY DAY    . atorvastatin (LIPITOR) 20 MG tablet Take 1 tablet (20 mg total) by mouth daily. 90 tablet 3  . Calcium 600-200 MG-UNIT tablet Take 1 tablet daily by mouth.    . Cholecalciferol (VITAMIN D3) 2000 UNITS capsule Take 2,000 Units by mouth daily.    Marland Kitchen FOLIC ACID PO Take by mouth daily.    Marland Kitchen levothyroxine (SYNTHROID) 75 MCG tablet Take 1 tablet (75 mcg total) by mouth daily before breakfast. 90 tablet 3  . Magnesium 250 MG TABS Take 250 mg by mouth daily.    Marland Kitchen olmesartan (BENICAR) 20 MG tablet Take 0.5 tablets (10 mg total) by mouth daily. If BP>130/>80 take another 1/2 pill 45 tablet 3  . Omega-3 Fatty Acids (FISH OIL) 1000 MG CAPS Take 1,000 mg by mouth daily.     . sertraline (ZOLOFT) 25 MG tablet TAKE 1 TABLET BY MOUTH EVERY DAY 90 tablet 1  . vitamin B-12 (CYANOCOBALAMIN) 1000 MCG  tablet Take 2,000 mcg by mouth daily. 2 tabs    . Zinc 50 MG CAPS Take 50 mg by mouth daily.    Marland Kitchen amLODipine (NORVASC) 5 MG tablet TAKE 1 TABLET (5 MG TOTAL) BY MOUTH DAILY. IN AM WITH FOOD (Patient not taking: Reported on 10/06/2020) 90 tablet 3  . omeprazole (PRILOSEC) 20 MG capsule Take 1 capsule (20 mg total) by mouth 2 (two) times daily before a meal. 60 capsule 3   No current facility-administered medications for this visit.      REVIEW OF SYSTEMS (Negative unless checked)  Constitutional: [] Weight loss  [] Fever  [] Chills Cardiac: [] Chest pain   [] Chest pressure   [] Palpitations   [] Shortness of breath when laying flat   [] Shortness of breath at rest   [] Shortness of breath with exertion. Vascular:  [x] Pain in legs with walking   [x] Pain in legs at rest   [] Pain in legs when laying flat   [] Claudication   [] Pain in feet when walking  [] Pain in feet at rest  [] Pain in feet when laying flat   [] History of DVT   [] Phlebitis   [x] Swelling in  legs   [] Varicose veins   [x] Non-healing ulcers Pulmonary:   [] Uses home oxygen   [] Productive cough   [] Hemoptysis   [] Wheeze  [] COPD   [] Asthma Neurologic:  [] Dizziness  [] Blackouts   [] Seizures   [] History of stroke   [] History of TIA  [] Aphasia   [] Temporary blindness   [] Dysphagia   [] Weakness or numbness in arms   [] Weakness or numbness in legs Musculoskeletal:  [x] Arthritis   [] Joint swelling   [x] Joint pain   [] Low back pain Hematologic:  [] Easy bruising  [] Easy bleeding   [] Hypercoagulable state   [] Anemic  [] Hepatitis Gastrointestinal:  [] Blood in stool   [] Vomiting blood  [] Gastroesophageal reflux/heartburn   [] Abdominal pain Genitourinary:  [] Chronic kidney disease   [] Difficult urination  [] Frequent urination  [] Burning with urination   [] Hematuria Skin:  [] Rashes   [x] Ulcers   [x] Wounds Psychological:  [] History of anxiety   []  History of major depression.    Physical Exam BP 130/74 (BP Location: Right Arm)   Pulse 62   Resp 16   Ht  5\' 6"  (1.676 m)   Wt 189 lb 6.4 oz (85.9 kg)   BMI 30.57 kg/m  Gen:  WD/WN, NAD.  Appears younger than stated age Head: [place]/AT, No temporalis wasting.  Ear/Nose/Throat: Hearing grossly intact, nares w/o erythema or drainage, oropharynx w/o Erythema/Exudate Eyes: Conjunctiva clear, sclera non-icteric  Neck: trachea midline.  No JVD.  Pulmonary:  Good air movement, respirations not labored, no use of accessory muscles  Cardiac: RRR, no JVD Vascular:  Vessel Right Left  Radial Palpable Palpable                          DP  2+  2+  PT  1+  trace   Gastrointestinal:. No masses, surgical incisions, or scars. Musculoskeletal: M/S 5/5 throughout.  Extremities without ischemic changes.  No deformity or atrophy.  Trace right lower extremity edema, 1-2+ left lower extremity edema. Neurologic: Sensation grossly intact in extremities.  Symmetrical.  Speech is fluent. Motor exam as listed above. Psychiatric: Judgment intact, Mood & affect appropriate for pt's clinical situation. Dermatologic: No rashes or ulcers noted.  No cellulitis or open wounds.    Radiology DG Thoracic Spine 2 View  Result Date: 09/12/2020 CLINICAL DATA:  Mid back pain EXAM: THORACIC SPINE 2 VIEWS COMPARISON:  Two-view chest x-ray 05/31/2019 FINDINGS: Twelve rib-bearing thoracic type vertebral bodies are present. Progressive endplate degenerative changes are present at T10-11 and particularly at T11-12. Progressive retrolisthesis is present at T11-12. Vertebral body heights are maintained. No acute or healing fractures are present. Heart size is normal. Atherosclerotic changes are present in the aorta. Surgical clips are present at the gallbladder fossa. IMPRESSION: 1. Progressive endplate degenerative changes within the lower thoracic spine. 2. Progressive retrolisthesis at T11-12. 3. No acute or healing fractures. 4. Aortic atherosclerosis. Electronically Signed   By: San Morelle M.D.   On: 09/12/2020 09:32     Labs Recent Results (from the past 2160 hour(s))  Novel Coronavirus, NAA ([facility])     Status: None   Collection Time: 08/06/20  5:18 PM   Specimen: Nasopharyngeal(NP) swabs in vial transport medium   Nasopharynge  Screenin  Result Value Ref Range   SARS-CoV-2, NAA Not Detected Not Detected    Comment: This nucleic acid amplification test was developed and its performance characteristics determined by [employer]. Nucleic acid amplification tests include RT-PCR and TMA. This test has not been FDA cleared or approved.  This test has been authorized by FDA under an Emergency Use Authorization (EUA). This test is only authorized for the duration of time the declaration that circumstances exist justifying the authorization of the emergency use of in vitro diagnostic tests for detection of SARS-CoV-2 virus and/or diagnosis of COVID-19 infection under section 564(b)(1) of the Act, 21 U.S.C. 833ASN-0(N) (1), unless the authorization is terminated or revoked sooner. When diagnostic testing is negative, the possibility of a false negative result should be considered in the context of a patient's recent exposures and the presence of clinical signs and symptoms consistent with COVID-19. An individual without symptoms of COVID-19 and who is not shedding SARS-CoV-2 virus wo uld expect to have a negative (not detected) result in this assay.   SARS-COV-2, NAA 2 DAY TAT     Status: None   Collection Time: 08/06/20  5:18 PM   Nasopharynge  Screenin  Result Value Ref Range   SARS-CoV-2, NAA 2 DAY TAT Performed   Comprehensive metabolic panel     Status: Abnormal   Collection Time: 09/08/20  9:26 AM  Result Value Ref Range   Sodium 139 135 - 145 mEq/L   Potassium 4.6 3.5 - 5.1 mEq/L   Chloride 102 96 - 112 mEq/L   CO2 29 19 - 32 mEq/L   Glucose, Bld 129 (H) 70 - 99 mg/dL   BUN 24 (H) 6 - 23 mg/dL   Creatinine, Ser 1.02 0.40 - 1.20 mg/dL   Total Bilirubin 1.2 0.2 - 1.2 mg/dL    Alkaline Phosphatase 68 39 - 117 U/L   AST 13 0 - 37 U/L   ALT 11 0 - 35 U/L   Total Protein 6.5 6.0 - 8.3 g/dL   Albumin 4.3 3.5 - 5.2 g/dL   GFR 52.24 (L) >60.00 mL/min    Comment: Calculated using the CKD-EPI Creatinine Equation (2021)   Calcium 9.5 8.4 - 10.5 mg/dL  Lactic acid, plasma     Status: None   Collection Time: 09/08/20  9:26 AM  Result Value Ref Range   Lactate, Ven 13.9 4.8 - 25.7 mg/dL  CBC with Differential/Platelet     Status: None   Collection Time: 09/08/20  9:26 AM  Result Value Ref Range   WBC 5.8 4.0 - 10.5 K/uL   RBC 4.45 3.87 - 5.11 Mil/uL   Hemoglobin 13.2 12.0 - 15.0 g/dL   HCT 39.7 36.0 - 46.0 %   MCV 89.1 78.0 - 100.0 fl   MCHC 33.2 30.0 - 36.0 g/dL   RDW 13.7 11.5 - 15.5 %   Platelets 205.0 150.0 - 400.0 K/uL   Neutrophils Relative % 58.7 43.0 - 77.0 %   Lymphocytes Relative 29.9 12.0 - 46.0 %   Monocytes Relative 8.1 3.0 - 12.0 %   Eosinophils Relative 2.8 0.0 - 5.0 %   Basophils Relative 0.5 0.0 - 3.0 %   Neutro Abs 3.4 1.4 - 7.7 K/uL   Lymphs Abs 1.7 0.7 - 4.0 K/uL   Monocytes Absolute 0.5 0.1 - 1.0 K/uL   Eosinophils Absolute 0.2 0.0 - 0.7 K/uL   Basophils Absolute 0.0 0.0 - 0.1 K/uL  POCT Urinalysis Dipstick     Status: Abnormal   Collection Time: 09/10/20  2:40 PM  Result Value Ref Range   Color, UA yellow    Clarity, UA clear    Glucose, UA Negative Negative   Bilirubin, UA Neg    Ketones, UA Neg    Spec Grav, UA <=1.005 (A) 1.010 -  1.025   Blood, UA Neg    pH, UA 5.0 5.0 - 8.0   Protein, UA Negative Negative   Urobilinogen, UA 0.2 0.2 or 1.0 E.U./dL   Nitrite, UA Neg    Leukocytes, UA Negative Negative   Appearance     Odor    Urine Culture     Status: None   Collection Time: 09/10/20  3:01 PM   Specimen: Urine   Urine  Result Value Ref Range   Urine Culture, Routine Final report    Organism ID, Bacteria Comment     Comment: Mixed urogenital flora Less than 10,000 colonies/mL   POCT occult blood stool     Status:  Normal   Collection Time: 09/10/20  4:00 PM  Result Value Ref Range   Fecal Occult Blood, POC Negative Negative   Card #1 Date     Card #2 Fecal Occult Blod, POC     Card #2 Date     Card #3 Fecal Occult Blood, POC     Card #3 Date    TSH     Status: None   Collection Time: 09/18/20  9:52 AM  Result Value Ref Range   TSH 1.540 0.350 - 4.500 uIU/mL    Comment: Performed by a 3rd Generation assay with a functional sensitivity of <=0.01 uIU/mL. Performed at [facility], [address] [place], [place] [address]   Basic metabolic panel     Status: Abnormal   Collection Time: 09/18/20  9:52 AM  Result Value Ref Range   Sodium 141 135 - 145 mmol/L   Potassium 4.0 3.5 - 5.1 mmol/L   Chloride 107 98 - 111 mmol/L   CO2 24 22 - 32 mmol/L   Glucose, Bld 110 (H) 70 - 99 mg/dL    Comment: Glucose reference range applies only to samples taken after fasting for at least 8 hours.   BUN 19 8 - 23 mg/dL   Creatinine, Ser 0.81 0.44 - 1.00 mg/dL   Calcium 9.2 8.9 - 10.3 mg/dL   GFR, Estimated >60 >60 mL/min    Comment: (NOTE) Calculated using the CKD-EPI Creatinine Equation (2021)    Anion gap 10 5 - 15    Comment: Performed at [facility], [address] [place], [place] [address]    Assessment/Plan:  Varicose veins of leg with pain, bilateral To evaluate her venous system, a reflux study was done today.  No DVT or superficial thrombophlebitis was seen.  She does have significant venous reflux in both great saphenous veins.  She has pain and swelling in both lower extremities and has already been wearing compression stockings and elevating her legs for many months.  This has not helped.  She does have a previous history of ulceration, going ahead and considering bilateral great saphenous vein laser ablation in a staged fashion would be very reasonable.  Risks and benefits were discussed with the patient.  She is agreeable to proceed.  Essential hypertension blood  pressure control important in reducing the progression of atherosclerotic disease. On appropriate oral medications.   SCCA (squamous cell carcinoma) of skin Multiple on both legs with multiple resections markedly increasing her risk of lymphedema in both legs.  Lymphedema Patient clearly has at least stage II lymphedema with chronic swelling refractory to compression and elevation.  She has venous disease were going to address as well.  I do believe the lymphedema pump would be an excellent adjuvant option to help her pain and swelling in  her lower extremities.  We will try to go ahead and get that approved at this time.      Leotis Pain 10/06/2020, 5:04 PM   This note was created with Dragon medical transcription system.  Any errors from dictation are unintentional.

## 2020-10-06 NOTE — Patient Instructions (Signed)
Lymphedema  Lymphedema is swelling that is caused by the abnormal collection of lymph in the tissues under the skin. Lymph is excess fluid from the tissues in your body that is removed through the lymphatic system. This system is part of your body's defense system (immune system) and includes lymph nodes and lymph vessels. The lymph vessels collect and carry the excess fluid, fats, proteins, and waste from the tissues of the body to the bloodstream. This system also works to clean and remove bacteria and waste products from the body. Lymphedema occurs when the lymphatic system is blocked. When the lymph vessels or lymph nodes are blocked or damaged, lymph does not drain properly. This causes an abnormal buildup of lymph, which leads to swelling in the affected area. This may include the trunk area, or an arm or leg. Lymphedema cannot be cured by medicines, but various methods can be used to help reduce the swelling. What are the causes? The cause of this condition depends on the type of lymphedema that you have.  Primary lymphedema is caused by the absence of lymph vessels or having abnormal lymph vessels at birth.  Secondary lymphedema occurs when lymph vessels are blocked or damaged. Secondary lymphedema is more common. Common causes of lymph vessel blockage include: ? Skin infection, such as cellulitis. ? Infection by parasites (filariasis). ? Injury. ? Radiation therapy. ? Cancer. ? Formation of scar tissue. ? Surgery. What are the signs or symptoms? Symptoms of this condition include:  Swelling of the arm or leg.  A heavy or tight feeling in the arm or leg.  Swelling of the feet, toes, or fingers. Shoes or rings may fit more tightly than before.  Redness of the skin over the affected area.  Limited movement of the affected limb.  Sensitivity to touch or discomfort in the affected limb. How is this diagnosed? This condition may be diagnosed based on:  Your symptoms and medical  history.  A physical exam.  Bioimpedance spectroscopy. In this test, painless electrical currents are used to measure fluid levels in your body.  Imaging tests, such as: ? MRI. ? CT scan. ? Duplex ultrasound. This test uses sound waves to produce images of the vessels and the blood flow on a screen. ? Lymphoscintigraphy. In this test, a low dose of a radioactive substance is injected to trace the flow of lymph through your lymph vessels. ? Lymphangiography. In this test, a contrast dye is injected into the lymph vessel to help show blockages. How is this treated? If an underlying condition is causing the lymphedema, that condition will be treated. For example, antibiotic medicines may be used to treat an infection. Treatment for this condition will depend on the cause of your lymphedema. Treatment may include:  Complete decongestive therapy (CDT). This is done by a certified lymphedema therapist to reduce fluid congestion. This therapy includes: ? Skin care. ? Compression wrapping of the affected area. ? Manual lymph drainage. This is a special massage technique that promotes lymph drainage out of a limb. ? Specific exercises. Certain exercises can help fluid move out of the affected limb.  Compression. Various methods may be used to apply pressure to the affected limb to reduce the swelling. They include: ? Wearing compression stockings or sleeves on the affected limb. ? Wrapping the affected limb with special bandages.  Surgery. This is usually done for severe cases only. For example, surgery may be done if you have trouble moving the limb or if the swelling does not  get better with other treatments.   Follow these instructions at home: Self-care  The affected area is more likely to become injured or infected. Take these steps to help prevent infection: ? Keep the affected area clean and dry. ? Use approved creams or lotions to keep the skin moisturized. ? Protect your skin from  cuts:  Use gloves while cooking or gardening.  Do not walk barefoot.  If you shave the affected area, use an Copy.  Do not wear tight clothes, shoes, or jewelry.  Eat a healthy diet that includes a lot of fruits and vegetables. Activity  Do exercises as told by your health care provider.  Do not sit with your legs crossed.  When possible, keep the affected limb raised (elevated) above the level of your heart.  Avoid carrying things with an arm that is affected by lymphedema. General instructions  Wear compression stockings or sleeves as told by your health care provider.  Note any changes in size of the affected limb. You may be instructed to take regular measurements and keep track of them.  Take over-the-counter and prescription medicines only as told by your health care provider.  If you were prescribed an antibiotic medicine, take or apply it as told by your health care provider. Do not stop using the antibiotic even if you start to feel better or if your condition improves.  Do not use heating pads or ice packs on the affected area.  Avoid having blood draws, IV insertions, or blood pressure checks on the affected limb.  Keep all follow-up visits. This is important. Contact a health care provider if you:  Continue to have swelling in your limb.  Have fluid leaking from the skin of your swollen limb.  Have a cut that does not heal.  Have redness or pain in the affected area.  Develop purplish spots, rash, blisters, or sores (lesions) on your affected limb. Get help right away if you:  Have new swelling in your limb that starts suddenly.  Have shortness of breath or chest pain.  Have a fever or chills. These symptoms may represent a serious problem that is an emergency. Do not wait to see if the symptoms will go away. Get medical help right away. Call your local emergency services (911 in the [place] Do not drive yourself to the  hospital. Summary  Lymphedema is swelling that is caused by the abnormal collection of lymph in the tissues under the skin.  Lymph is fluid from the tissues in your body that is removed through the lymphatic system. This system collects and carries excess fluid, fats, proteins, and wastes from the tissues of the body to the bloodstream.  Lymphedema causes swelling, pain, and redness in the affected area. This may include the trunk area, or an arm or leg.  Treatment for this condition may depend on the cause of your lymphedema. Treatment may include treating the underlying cause, complete decongestive therapy (CDT), compression methods, or surgery. This information is not intended to replace advice given to you by your health care provider. Make sure you discuss any questions you have with your health care provider. Document Revised: 04/29/2020 Document Reviewed: 04/29/2020 [employer] Patient Education  2021 [employer].

## 2020-10-06 NOTE — Assessment & Plan Note (Signed)
Patient clearly has at least stage II lymphedema with chronic swelling refractory to compression and elevation.  She has venous disease were going to address as well.  I do believe the lymphedema pump would be an excellent adjuvant option to help her pain and swelling in her lower extremities.  We will try to go ahead and get that approved at this time.

## 2020-10-07 ENCOUNTER — Encounter: Payer: Self-pay | Admitting: *Deleted

## 2020-10-07 DIAGNOSIS — R0609 Other forms of dyspnea: Secondary | ICD-10-CM

## 2020-10-07 DIAGNOSIS — R0600 Dyspnea, unspecified: Secondary | ICD-10-CM

## 2020-10-07 NOTE — Progress Notes (Signed)
Pulmonary Individual Treatment Plan  Patient Details  Name: [name] MRN: [id number] Date of Birth: 06-Feb-1941 Referring Provider:   Flowsheet Row Pulmonary Rehab from 08/17/2020 in [facility] Cardiac and Pulmonary Rehab  Referring Provider Ida Rogue MD      Initial Encounter Date:  Flowsheet Row Pulmonary Rehab from 08/17/2020 in [facility] Cardiac and Pulmonary Rehab  Date 08/17/20      Visit Diagnosis: DOE (dyspnea on exertion)  Patient's Home Medications on Admission:  Current Outpatient Medications:  .  albuterol (PROAIR HFA) 108 (90 Base) MCG/ACT inhaler, Inhale 1-2 puffs into the lungs every 6 (six) hours as needed for wheezing or shortness of breath., Disp: 18 g, Rfl: 11 .  amLODipine (NORVASC) 2.5 MG tablet, Take 1 tablet (2.5 mg total) by mouth daily. In am with food, Disp: 90 tablet, Rfl: 3 .  amLODipine (NORVASC) 5 MG tablet, TAKE 1 TABLET (5 MG TOTAL) BY MOUTH DAILY. IN AM WITH FOOD (Patient not taking: Reported on 10/06/2020), Disp: 90 tablet, Rfl: 3 .  Ascorbic Acid (VITAMIN C) 1000 MG tablet, Take 1,000 mg by mouth daily., Disp: , Rfl:  .  aspirin 81 MG tablet, TAKE 1 TABLET BY MOUTH EVERY DAY, Disp: , Rfl:  .  atorvastatin (LIPITOR) 20 MG tablet, Take 1 tablet (20 mg total) by mouth daily., Disp: 90 tablet, Rfl: 3 .  Calcium 600-200 MG-UNIT tablet, Take 1 tablet daily by mouth., Disp: , Rfl:  .  Cholecalciferol (VITAMIN D3) 2000 UNITS capsule, Take 2,000 Units by mouth daily., Disp: , Rfl:  .  FOLIC ACID PO, Take by mouth daily., Disp: , Rfl:  .  levothyroxine (SYNTHROID) 75 MCG tablet, Take 1 tablet (75 mcg total) by mouth daily before breakfast., Disp: 90 tablet, Rfl: 3 .  Magnesium 250 MG TABS, Take 250 mg by mouth daily., Disp: , Rfl:  .  olmesartan (BENICAR) 20 MG tablet, Take 0.5 tablets (10 mg total) by mouth daily. If BP>130/>80 take another 1/2 pill, Disp: 45 tablet, Rfl: 3 .  Omega-3 Fatty Acids (FISH OIL) 1000 MG CAPS, Take 1,000 mg by mouth daily. , Disp: ,  Rfl:  .  omeprazole (PRILOSEC) 20 MG capsule, Take 1 capsule (20 mg total) by mouth 2 (two) times daily before a meal., Disp: 60 capsule, Rfl: 3 .  sertraline (ZOLOFT) 25 MG tablet, TAKE 1 TABLET BY MOUTH EVERY DAY, Disp: 90 tablet, Rfl: 1 .  vitamin B-12 (CYANOCOBALAMIN) 1000 MCG tablet, Take 2,000 mcg by mouth daily. 2 tabs, Disp: , Rfl:  .  Zinc 50 MG CAPS, Take 50 mg by mouth daily., Disp: , Rfl:   Past Medical History: Past Medical History:  Diagnosis Date  . Acid reflux   . Allergy   . Anemia   . Anxiety    in past  . Arthritis    all over  . Asthma   . Atypical moles   . Bronchitis   . Bronchospasm    reactive with perfume/cologne and smoke  . CAD (coronary artery disease)    patient denies on preop of 10/24   . Candida esophagitis ([place])   . Carotid artery calcification, bilateral 04/2016  . Cataract   . Cervical stenosis of spine   . Chronic insomnia   . Complication of anesthesia    shoulder surgery- not all the way asleep  . Depression    in past  . Diverticulitis   . Diverticulosis   . Dizziness   . Elevated liver enzymes   .  Family history of adverse reaction to anesthesia    sister- has problems with nausea and vomiting   . Fatigue   . Fatty liver   . Generalized headache    migraines  . GERD (gastroesophageal reflux disease)   . History of chemotherapy    topical cream for h/o skin CA  . History of kidney stones   . History of neuropathy    FH Dr. Posey Pronto neurology  . History of vertigo   . Hx of migraines   . Hyperlipidemia   . Hypothyroidism   . Insomnia   . Irritable bowel syndrome   . LLQ abdominal pain   . Melanoma ([place]) 2013  . Melanoma ([place])    x2 stg II melanoma  . Neuropathy, peripheral   . Peripheral neuropathy   . Pneumonia    hx of x 2  . PONV (postoperative nausea and vomiting)   . Pre-diabetes   . Retina disorder    left  . SCC (squamous cell carcinoma)    skin   . Stroke [facility])    ? TIA per Dr V per patient   . Thyroid  disease   . Urine incontinence   . Vitamin D deficiency   . Weight gain     Tobacco Use: Social History   Tobacco Use  Smoking Status Never Smoker  Smokeless Tobacco Never Used    Labs: Recent Review Flowsheet Data    Labs for ITP Cardiac and Pulmonary Rehab Latest Ref Rng & Units 12/22/2014 05/10/2017 12/05/2018 09/09/2019 03/25/2020   Cholestrol 0 - 200 mg/dL 181 - 148 162 142   LDLCALC 0 - 99 mg/dL 108(H) - 58 60 52   HDL >39.00 mg/dL 57 - 71.60 83.50 69.80   Trlycerides 0.0 - 149.0 mg/dL 79 - 94.0 91.0 99.0   Hemoglobin A1c 4.6 - 6.5 % - 5.9(H) 6.2 6.0 6.1       Pulmonary Assessment Scores:  Pulmonary Assessment Scores    Row Name 08/17/20 1630         ADL UCSD   ADL Phase Entry     SOB Score total 32     Rest 1     Walk 2     Stairs 4     Bath 1     Dress 1     Shop 1           CAT Score   CAT Score 14           mMRC Score   mMRC Score 3            UCSD: Self-administered rating of dyspnea associated with activities of daily living (ADLs) 6-point scale (0 = "not at all" to 5 = "maximal or unable to do because of breathlessness")  Scoring Scores range from 0 to 120.  Minimally important difference is 5 units  CAT: CAT can identify the health impairment of COPD patients and is better correlated with disease progression.  CAT has a scoring range of zero to 40. The CAT score is classified into four groups of low (less than 10), medium (10 - 20), high (21-30) and very high (31-40) based on the impact level of disease on health status. A CAT score over 10 suggests significant symptoms.  A worsening CAT score could be explained by an exacerbation, poor medication adherence, poor inhaler technique, or progression of COPD or comorbid conditions.  CAT MCID is 2 points  mMRC: mMRC (Modified Medical Research Council) Dyspnea Scale  is used to assess the degree of baseline functional disability in patients of respiratory disease due to dyspnea. No minimal important  difference is established. A decrease in score of 1 point or greater is considered a positive change.   Pulmonary Function Assessment:   Exercise Target Goals: Exercise Program Goal: Individual exercise prescription set using results from initial 6 min walk test and THRR while considering  patient's activity barriers and safety.   Exercise Prescription Goal: Initial exercise prescription builds to 30-45 minutes a day of aerobic activity, 2-3 days per week.  Home exercise guidelines will be given to patient during program as part of exercise prescription that the participant will acknowledge.  Education: Aerobic Exercise: - Group verbal and visual presentation on the components of exercise prescription. Introduces F.I.T.T principle from ACSM for exercise prescriptions.  Reviews F.I.T.T. principles of aerobic exercise including progression. Written material given at graduation.   Education: Resistance Exercise: - Group verbal and visual presentation on the components of exercise prescription. Introduces F.I.T.T principle from ACSM for exercise prescriptions  Reviews F.I.T.T. principles of resistance exercise including progression. Written material given at graduation.    Education: Exercise & Equipment Safety: - Individual verbal instruction and demonstration of equipment use and safety with use of the equipment. Flowsheet Row Pulmonary Rehab from 08/10/2020 in [facility] Cardiac and Pulmonary Rehab  Date 08/10/20  Educator [facility]  Instruction Review Code 1- Verbalizes Understanding      Education: Exercise Physiology & General Exercise Guidelines: - Group verbal and written instruction with models to review the exercise physiology of the cardiovascular system and associated critical values. Provides general exercise guidelines with specific guidelines to those with heart or lung disease.    Education: Flexibility, Balance, Mind/Body Relaxation: - Group verbal and visual presentation with  interactive activity on the components of exercise prescription. Introduces F.I.T.T principle from ACSM for exercise prescriptions. Reviews F.I.T.T. principles of flexibility and balance exercise training including progression. Also discusses the mind body connection.  Reviews various relaxation techniques to help reduce and manage stress (i.e. Deep breathing, progressive muscle relaxation, and visualization). Balance handout provided to take home. Written material given at graduation.   Activity Barriers & Risk Stratification:  Activity Barriers & Cardiac Risk Stratification - 08/17/20 1638      Activity Barriers & Cardiac Risk Stratification   Activity Barriers Arthritis;Shortness of Breath;Left Hip Replacement;Left Knee Replacement;Other (comment)    Comments Rotator cuff surgery, L shoulder; Had accident on R side, completely torn R shoulder with surgery           6 Minute Walk:  6 Minute Walk    Row Name 08/24/20 1251         6 Minute Walk   Phase Initial     Distance 973 feet     Walk Time 6 minutes     # of Rest Breaks 0     MPH 1.84     METS 1.8     RPE 13     Perceived Dyspnea  3     VO2 Peak 6.31     Symptoms Yes (comment)     Comments SOB, dizziness (this is her normal)     Resting HR 63 bpm     Resting BP 116/62     Resting Oxygen Saturation  96 %     Exercise Oxygen Saturation  during 6 min walk 99 %     Max Ex. HR 101 bpm     Max Ex. BP 152/64  2 Minute Post BP 118/63           Interval HR   1 Minute HR 86     2 Minute HR 96     3 Minute HR 101     4 Minute HR 101     5 Minute HR 100     6 Minute HR 100     2 Minute Post HR 79     Interval Heart Rate? Yes           Interval Oxygen   Interval Oxygen? Yes     Baseline Oxygen Saturation % 96 %     1 Minute Oxygen Saturation % 98 %     1 Minute Liters of Oxygen 0 L  RA     2 Minute Oxygen Saturation % 98 %     2 Minute Liters of Oxygen 0 L     3 Minute Oxygen Saturation % 99 %     3 Minute  Liters of Oxygen 0 L     4 Minute Oxygen Saturation % 99 %     4 Minute Liters of Oxygen 0 L     5 Minute Oxygen Saturation % 99 %     5 Minute Liters of Oxygen 0 L     6 Minute Oxygen Saturation % 99 %     6 Minute Liters of Oxygen 0 L     2 Minute Post Oxygen Saturation % 100 %     2 Minute Post Liters of Oxygen 0 L           Oxygen Initial Assessment:  Oxygen Initial Assessment - 08/17/20 1629      Home Oxygen   Home Oxygen Device None    Sleep Oxygen Prescription None    Home Exercise Oxygen Prescription None    Home Resting Oxygen Prescription None    Compliance with Home Oxygen Use Yes      Initial 6 min Walk   Oxygen Used None      Program Oxygen Prescription   Program Oxygen Prescription None      Intervention   Short Term Goals To learn and demonstrate proper use of respiratory medications;To learn and demonstrate proper pursed lip breathing techniques or other breathing techniques.;To learn and understand importance of maintaining oxygen saturations>88%;To learn and understand importance of monitoring SPO2 with pulse oximeter and demonstrate accurate use of the pulse oximeter.    Long  Term Goals Verbalizes importance of monitoring SPO2 with pulse oximeter and return demonstration;Maintenance of O2 saturations>88%;Exhibits proper breathing techniques, such as pursed lip breathing or other method taught during program session;Compliance with respiratory medication;Demonstrates proper use of MDI's           Oxygen Re-Evaluation:   Oxygen Discharge (Final Oxygen Re-Evaluation):   Initial Exercise Prescription:  Initial Exercise Prescription - 08/17/20 1600      Date of Initial Exercise RX and Referring Provider   Date 08/17/20    Referring Provider Ida Rogue MD      NuStep   Level 1    SPM 80    Minutes 15    METs 1.8      T5 Nustep   Level 1    SPM 80    Minutes 15    METs 1.8      Biostep-RELP   Level 1    SPM 50    Minutes 15    METs  1.8      Prescription Details  Frequency (times per week) 3    Duration Progress to 30 minutes of continuous aerobic without signs/symptoms of physical distress      Intensity   THRR 40-80% of Max Heartrate 94-125    Ratings of Perceived Exertion 11-13    Perceived Dyspnea 0-4      Progression   Progression Continue to progress workloads to maintain intensity without signs/symptoms of physical distress.      Resistance Training   Training Prescription Yes           Perform Capillary Blood Glucose checks as needed.  Exercise Prescription Changes:  Exercise Prescription Changes    Row Name 08/17/20 1600             Response to Exercise   Blood Pressure (Admit) 116/62       Blood Pressure (Exercise) 152/64       Blood Pressure (Exit) 118/64       Heart Rate (Admit) 63 bpm       Heart Rate (Exercise) 101 bpm       Heart Rate (Exit) 66 bpm       Oxygen Saturation (Admit) 96 %       Oxygen Saturation (Exercise) 98 %       Oxygen Saturation (Exit) 99 %       Rating of Perceived Exertion (Exercise) 13       Perceived Dyspnea (Exercise) 3       Symptoms SOB, dizziness (her usual)       Comments walk test results               Resistance Training   Weight 3 lb       Reps 10-15              Exercise Comments:   Exercise Goals and Review:  Exercise Goals    Row Name 08/17/20 1655             Exercise Goals   Increase Physical Activity Yes       Intervention Provide advice, education, support and counseling about physical activity/exercise needs.;Develop an individualized exercise prescription for aerobic and resistive training based on initial evaluation findings, risk stratification, comorbidities and participant's personal goals.       Expected Outcomes Short Term: Attend rehab on a regular basis to increase amount of physical activity.;Long Term: Add in home exercise to make exercise part of routine and to increase amount of physical activity.;Long Term:  Exercising regularly at least 3-5 days a week.       Increase Strength and Stamina Yes       Intervention Provide advice, education, support and counseling about physical activity/exercise needs.;Develop an individualized exercise prescription for aerobic and resistive training based on initial evaluation findings, risk stratification, comorbidities and participant's personal goals.       Expected Outcomes Short Term: Increase workloads from initial exercise prescription for resistance, speed, and METs.;Short Term: Perform resistance training exercises routinely during rehab and add in resistance training at home;Long Term: Improve cardiorespiratory fitness, muscular endurance and strength as measured by increased METs and functional capacity (6MWT)       Able to understand and use rate of perceived exertion (RPE) scale Yes       Intervention Provide education and explanation on how to use RPE scale       Expected Outcomes Short Term: Able to use RPE daily in rehab to express subjective intensity level;Long Term:  Able to use RPE to guide intensity  level when exercising independently       Able to understand and use Dyspnea scale Yes       Intervention Provide education and explanation on how to use Dyspnea scale       Expected Outcomes Short Term: Able to use Dyspnea scale daily in rehab to express subjective sense of shortness of breath during exertion;Long Term: Able to use Dyspnea scale to guide intensity level when exercising independently       Knowledge and understanding of Target Heart Rate Range (THRR) Yes       Intervention Provide education and explanation of THRR including how the numbers were predicted and where they are located for reference       Expected Outcomes Short Term: Able to state/look up THRR;Short Term: Able to use daily as guideline for intensity in rehab;Long Term: Able to use THRR to govern intensity when exercising independently       Able to check pulse independently Yes        Intervention Provide education and demonstration on how to check pulse in carotid and radial arteries.;Review the importance of being able to check your own pulse for safety during independent exercise       Expected Outcomes Short Term: Able to explain why pulse checking is important during independent exercise;Long Term: Able to check pulse independently and accurately       Understanding of Exercise Prescription Yes       Intervention Provide education, explanation, and written materials on patient's individual exercise prescription       Expected Outcomes Short Term: Able to explain program exercise prescription;Long Term: Able to explain home exercise prescription to exercise independently              Exercise Goals Re-Evaluation :  Exercise Goals Re-Evaluation    Row Name 09/09/20 1145             Exercise Goal Re-Evaluation   Comments Has yet to start rehab due to medical concerns              Discharge Exercise Prescription (Final Exercise Prescription Changes):  Exercise Prescription Changes - 08/17/20 1600      Response to Exercise   Blood Pressure (Admit) 116/62    Blood Pressure (Exercise) 152/64    Blood Pressure (Exit) 118/64    Heart Rate (Admit) 63 bpm    Heart Rate (Exercise) 101 bpm    Heart Rate (Exit) 66 bpm    Oxygen Saturation (Admit) 96 %    Oxygen Saturation (Exercise) 98 %    Oxygen Saturation (Exit) 99 %    Rating of Perceived Exertion (Exercise) 13    Perceived Dyspnea (Exercise) 3    Symptoms SOB, dizziness (her usual)    Comments walk test results      Resistance Training   Weight 3 lb    Reps 10-15           Nutrition:  Target Goals: Understanding of nutrition guidelines, daily intake of sodium 1500mg , cholesterol 200mg , calories 30% from fat and 7% or less from saturated fats, daily to have 5 or more servings of fruits and vegetables.  Education: All About Nutrition: -Group instruction provided by verbal, written material,  interactive activities, discussions, models, and posters to present general guidelines for heart healthy nutrition including fat, fiber, MyPlate, the role of sodium in heart healthy nutrition, utilization of the nutrition label, and utilization of this knowledge for meal planning. Follow up email sent as well. Written  material given at graduation.   Biometrics:  Pre Biometrics - 08/17/20 1639      Pre Biometrics   Height 5\' 5"  (1.651 m)    Weight 183 lb 1.6 oz (83.1 kg)    BMI (Calculated) 30.47    Single Leg Stand 2.44 seconds            Nutrition Therapy Plan and Nutrition Goals:   Nutrition Assessments:  MEDIFICTS Score Key:  ?70 Need to make dietary changes   40-70 Heart Healthy Diet  ? 40 Therapeutic Level Cholesterol Diet   Picture Your Plate Scores:  <68 Unhealthy dietary pattern with much room for improvement.  41-50 Dietary pattern unlikely to meet recommendations for good health and room for improvement.  51-60 More healthful dietary pattern, with some room for improvement.   >60 Healthy dietary pattern, although there may be some specific behaviors that could be improved.   Nutrition Goals Re-Evaluation:   Nutrition Goals Discharge (Final Nutrition Goals Re-Evaluation):   Psychosocial: Target Goals: Acknowledge presence or absence of significant depression and/or stress, maximize coping skills, provide positive support system. Participant is able to verbalize types and ability to use techniques and skills needed for reducing stress and depression.   Education: Stress, Anxiety, and Depression - Group verbal and visual presentation to define topics covered.  Reviews how body is impacted by stress, anxiety, and depression.  Also discusses healthy ways to reduce stress and to treat/manage anxiety and depression.  Written material given at graduation.   Education: Sleep Hygiene -Provides group verbal and written instruction about how sleep can affect your  health.  Define sleep hygiene, discuss sleep cycles and impact of sleep habits. Review good sleep hygiene tips.    Initial Review & Psychosocial Screening:  Initial Psych Review & Screening - 08/10/20 1344      Initial Review   Current issues with Current Psychotropic Meds;Current Depression;History of Depression;Current Anxiety/Panic;Current Sleep Concerns      Family Dynamics   Good Support System? Yes    Comments Zigmund Daniel states that she has three children and she has been concerned about them. One of her daughters is married to a control freak andis concerned about her. She has had skin cancer and 5 years of oncology. She could not take care of her new puppy when she was going through therapy and had a contract with the vet to hold on to her and adopted her puppy out. Zigmund Daniel can look to her friends and other two kids for support.      Barriers   Psychosocial barriers to participate in program The patient should benefit from training in stress management and relaxation.      Screening Interventions   Interventions Encouraged to exercise;To provide support and resources with identified psychosocial needs;Provide feedback about the scores to participant    Expected Outcomes Short Term goal: Utilizing psychosocial counselor, staff and physician to assist with identification of specific Stressors or current issues interfering with healing process. Setting desired goal for each stressor or current issue identified.;Long Term Goal: Stressors or current issues are controlled or eliminated.;Short Term goal: Identification and review with participant of any Quality of Life or Depression concerns found by scoring the questionnaire.;Long Term goal: The participant improves quality of Life and PHQ9 Scores as seen by post scores and/or verbalization of changes           Quality of Life Scores:  Scores of 19 and below usually indicate a poorer quality of life in these areas.  A difference of  2-3 points is a  clinically meaningful difference.  A difference of 2-3 points in the total score of the Quality of Life Index has been associated with significant improvement in overall quality of life, self-image, physical symptoms, and general health in studies assessing change in quality of life.  PHQ-9: Recent Review Flowsheet Data    Depression screen [facility] 2/9 08/17/2020 08/10/2020 02/28/2020 11/01/2019 08/08/2019   Decreased Interest 1 1 0 0 0   Down, Depressed, Hopeless 1 0 0 0 1   PHQ - 2 Score 2 1 0 0 1   Altered sleeping 2 - 0 - -   Tired, decreased energy 1 - 0 - -   Change in appetite 1 - 0 - -   Feeling bad or failure about yourself  1 - 0 - -   Trouble concentrating 0 - 0 - -   Moving slowly or fidgety/restless 0 - 0 - -   Suicidal thoughts 0 - 0 - -   PHQ-9 Score 7 - 0 - -   Difficult doing work/chores Not difficult at all - Not difficult at all - -     Interpretation of Total Score  Total Score Depression Severity:  1-4 = Minimal depression, 5-9 = Mild depression, 10-14 = Moderate depression, 15-19 = Moderately severe depression, 20-27 = Severe depression   Psychosocial Evaluation and Intervention:  Psychosocial Evaluation - 08/10/20 1348      Psychosocial Evaluation & Interventions   Interventions Encouraged to exercise with the program and follow exercise prescription;Stress management education;Relaxation education    Comments Zigmund Daniel states that she has three children and she has been concerned about them. One of her daughters is married to a control freak andis concerned about her. She has had skin cancer and 5 years of oncology. She could not take care of her new puppy when she was going through therapy and had a contract with the vet to hold on to her and adopted her puppy out. Zigmund Daniel can look to her friends and other two kids for support.    Expected Outcomes Short: Exercise regularly to support mental health and notify staff of any changes. Long: maintain mental health and well being through  teaching of rehab or prescribed medications independently.    Continue Psychosocial Services  Follow up required by staff           Psychosocial Re-Evaluation:   Psychosocial Discharge (Final Psychosocial Re-Evaluation):   Education: Education Goals: Education classes will be provided on a weekly basis, covering required topics. Participant will state understanding/return demonstration of topics presented.  Learning Barriers/Preferences:  Learning Barriers/Preferences - 08/10/20 1341      Learning Barriers/Preferences   Learning Barriers None    Learning Preferences None           General Pulmonary Education Topics:  Infection Prevention: - Provides verbal and written material to individual with discussion of infection control including proper hand washing and proper equipment cleaning during exercise session. Flowsheet Row Pulmonary Rehab from 08/10/2020 in [facility] Cardiac and Pulmonary Rehab  Date 08/10/20  Educator [facility]  Instruction Review Code 1- Verbalizes Understanding      Falls Prevention: - Provides verbal and written material to individual with discussion of falls prevention and safety. Flowsheet Row Pulmonary Rehab from 08/10/2020 in [facility] Cardiac and Pulmonary Rehab  Date 08/10/20  Educator [facility]  Instruction Review Code 1- Verbalizes Understanding      Chronic Lung Disease Review: - Group verbal instruction with  posters, models, PowerPoint presentations and videos,  to review new updates, new respiratory medications, new advancements in procedures and treatments. Providing information on websites and "800" numbers for continued self-education. Includes information about supplement oxygen, available portable oxygen systems, continuous and intermittent flow rates, oxygen safety, concentrators, and Medicare reimbursement for oxygen. Explanation of Pulmonary Drugs, including class, frequency, complications, importance of spacers, rinsing mouth after steroid MDI's, and  proper cleaning methods for nebulizers. Review of basic lung anatomy and physiology related to function, structure, and complications of lung disease. Review of risk factors. Discussion about methods for diagnosing sleep apnea and types of masks and machines for OSA. Includes a review of the use of types of environmental controls: home humidity, furnaces, filters, dust mite/pet prevention, HEPA vacuums. Discussion about weather changes, air quality and the benefits of nasal washing. Instruction on Warning signs, infection symptoms, calling MD promptly, preventive modes, and value of vaccinations. Review of effective airway clearance, coughing and/or vibration techniques. Emphasizing that all should Create an Action Plan. Written material given at graduation.   AED/CPR: - Group verbal and written instruction with the use of models to demonstrate the basic use of the AED with the basic ABC's of resuscitation.    Anatomy and Cardiac Procedures: - Group verbal and visual presentation and models provide information about basic cardiac anatomy and function. Reviews the testing methods done to diagnose heart disease and the outcomes of the test results. Describes the treatment choices: Medical Management, Angioplasty, or Coronary Bypass Surgery for treating various heart conditions including Myocardial Infarction, Angina, Valve Disease, and Cardiac Arrhythmias.  Written material given at graduation.   Medication Safety: - Group verbal and visual instruction to review commonly prescribed medications for heart and lung disease. Reviews the medication, class of the drug, and side effects. Includes the steps to properly store meds and maintain the prescription regimen.  Written material given at graduation.   Other: -Provides group and verbal instruction on various topics (see comments)   Knowledge Questionnaire Score:  Knowledge Questionnaire Score - 08/17/20 1628      Knowledge Questionnaire Score    Pre Score 15/18: O2 sats, oxygen use            Core Components/Risk Factors/Patient Goals at Admission:  Personal Goals and Risk Factors at Admission - 08/17/20 1653      Core Components/Risk Factors/Patient Goals on Admission    Weight Management Yes;Weight Loss    Intervention Weight Management: Develop a combined nutrition and exercise program designed to reach desired caloric intake, while maintaining appropriate intake of nutrient and fiber, sodium and fats, and appropriate energy expenditure required for the weight goal.;Weight Management: Provide education and appropriate resources to help participant work on and attain dietary goals.    Admit Weight 183 lb (83 kg)    Goal Weight: Short Term 178 lb (80.7 kg)    Goal Weight: Long Term 173 lb (78.5 kg)    Expected Outcomes Long Term: Adherence to nutrition and physical activity/exercise program aimed toward attainment of established weight goal;Short Term: Continue to assess and modify interventions until short term weight is achieved;Weight Loss: Understanding of general recommendations for a balanced deficit meal plan, which promotes 1-2 lb weight loss per week and includes a negative energy balance of [id number] kcal/d;Understanding recommendations for meals to include 15-35% energy as protein, 25-35% energy from fat, 35-60% energy from carbohydrates, less than 200mg  of dietary cholesterol, 20-35 gm of total fiber daily;Understanding of distribution of calorie intake throughout the day with the  consumption of 4-5 meals/snacks    Hypertension Yes    Intervention Provide education on lifestyle modifcations including regular physical activity/exercise, weight management, moderate sodium restriction and increased consumption of fresh fruit, vegetables, and low fat dairy, alcohol moderation, and smoking cessation.;Monitor prescription use compliance.    Expected Outcomes Short Term: Continued assessment and intervention until BP is < 140/79mm HG  in hypertensive participants. < 130/32mm HG in hypertensive participants with diabetes, heart failure or chronic kidney disease.;Long Term: Maintenance of blood pressure at goal levels.    Lipids Yes    Intervention Provide education and support for participant on nutrition & aerobic/resistive exercise along with prescribed medications to achieve LDL 70mg , HDL >40mg .    Expected Outcomes Short Term: Participant states understanding of desired cholesterol values and is compliant with medications prescribed. Participant is following exercise prescription and nutrition guidelines.;Long Term: Cholesterol controlled with medications as prescribed, with individualized exercise RX and with personalized nutrition plan. Value goals: LDL < 70mg , HDL > 40 mg.           Education:Diabetes - Individual verbal and written instruction to review signs/symptoms of diabetes, desired ranges of glucose level fasting, after meals and with exercise. Acknowledge that pre and post exercise glucose checks will be done for 3 sessions at entry of program.   Know Your Numbers and Heart Failure: - Group verbal and visual instruction to discuss disease risk factors for cardiac and pulmonary disease and treatment options.  Reviews associated critical values for Overweight/Obesity, Hypertension, Cholesterol, and Diabetes.  Discusses basics of heart failure: signs/symptoms and treatments.  Introduces Heart Failure Zone chart for action plan for heart failure.  Written material given at graduation.   Core Components/Risk Factors/Patient Goals Review:    Core Components/Risk Factors/Patient Goals at Discharge (Final Review):    ITP Comments:  ITP Comments    Row Name 08/10/20 1340 08/17/20 1626 08/27/20 1444 09/09/20 0621 09/09/20 1146   ITP Comments Virtual Visit completed. Patient informed on EP and RD appointment and 6 Minute walk test. Patient also informed of patient health questionnaires on My Chart. Patient  Verbalizes understanding. Visit diagnosis can be found in [facility] 07/21/2020. Completed 6MWT and gym orientation. Initial ITP created and sent for review to Dr. Emily Filbert, Medical Director. Spoke with patient- she was not comfortable coming to Pulmonary Rehab today as she reports she has had worse episodes of lightheadedness and dizziness, and also is in talk with her doctor about possible lymphedema. Encouraged her to call her doctor as soon as we hung up to report her worsening symptoms. Advised not to drive and call [phone] if it is warranted. Will place patient on medical hold for rehab until she gets more information from her providers. Patient agreed with plan. 30 Day review completed. Medical Director ITP review done, changes made as directed, and signed approval by Medical Director. Continues to be out with medical concerns.  Vascular follow up on 2/24.  [place] requested cardiology look for lightheadedness   Row Name 09/14/20 1240 09/22/20 1122 10/07/20 0728       ITP Comments Patient is getting a holter monitor to look for any irregular rhythms- patient prefers to hold on rehab until that is completed. Discussed possible discharge with new referral depending how long patient will need the monitor for. Patient will call us once appointment is set up. Spoke with patient regarding rehab. She is getting a holter monitor for 2 weeks- and wants to wait until she gets those results back until she  starts here. We will hold patient on medical hold but informed patient her time slot will be opened. Patient understands and will call us when she gets her results back. Next steps will be determined on her results/doctor orders going forward 30 Day review completed. Medical Director ITP review done, changes made as directed, and signed approval by Medical Director.  Out for medical reason            Comments: 1

## 2020-10-08 ENCOUNTER — Encounter: Payer: PPO | Admitting: Vascular Surgery

## 2020-10-08 ENCOUNTER — Encounter (HOSPITAL_COMMUNITY): Payer: PPO

## 2020-10-09 DIAGNOSIS — I499 Cardiac arrhythmia, unspecified: Secondary | ICD-10-CM | POA: Diagnosis not present

## 2020-10-20 ENCOUNTER — Telehealth: Payer: Self-pay

## 2020-10-20 NOTE — Telephone Encounter (Signed)
Left message with patient regarding attendance with rehab.

## 2020-10-21 ENCOUNTER — Telehealth: Payer: Self-pay

## 2020-10-21 NOTE — Telephone Encounter (Signed)
Able to reach pt regarding her recent Zio monitor, [name] had a chance to review her results and advised   "Heart monitor showed mainly normal rhythm, with average heart rate of 72bpm, 1 run of SVT (fast heart rate) for 4 beats, no significant arrhythmia"  Pt reports has an appt this Fri with Dr. Rockey Situ, can go over more in detail at appt,  otherwise all questions or concerns were address and no additional concerns at this time.

## 2020-10-21 NOTE — Telephone Encounter (Signed)
-----   Message from Sylvester, PA-C sent at 10/20/2020  4:30 PM EDT ----- Heart monitor showed mainly normal rhythm, with average heart rate of 72bpm, 1 run of SVT (fast heart rate) for 4 beats, no significant arrhythmia

## 2020-10-22 NOTE — Progress Notes (Signed)
Cardiology Office Note:    Date:  10/23/2020   ID:  [name], DOB 10/19/1940, MRN [id number]  PCP:  McLean-Scocuzza, Nino Glow, MD  [facility] Cardiologist:  No primary care provider on file.  [facility] Electrophysiologist:  None   Referring MD: McLean-Scocuzza, Olivia Mackie *   Chief Complaint  Patient presents with  . 5-6 week follow up     Patient c/o shortness of breath with little exertion and LE edema. Medications reviewed by the patient verbally.     History of Present Illness:    [name] is a 80 y.o. Female with hx of  CAD and aortic athero on CT scan Chronic dizziness Mild carotid dz Hyperlipidemia obesity, deconditioning Syncope Scarring in lung apices on CT Chronic SOB Who presents for  coronary disease, edema, chronic stable angina, shortness of breath, pulmonary fibrosis  LOV 1/22 On her last clinic visit reported that she was sedentary Prior ischemic work-up unrevealing, echo unrevealing  In follow-up today again sedentary, no regular exercise program, has given up on heart track Sitting more, Continues to have SOB,lightheaded, Reports blood pressure running low at times down to 756 systolic  Has lymphedema, reports has TEDS,  Measured for pumps, Followed by vascular On amlodipine  Recent imaging results reviewed with her Stress test 02/2020 Normal echo 12/2019  Does her ADLS, Groceries, not much extra  EKG personally reviewed by myself on todays visit nsr rate 62 bpm no significant ST-T wave changes  Other past medical history reviewed Chronic shortness of breath after walking short distances has to stop to catch her breath, weight continues to run high Used to go to the gym years ago, sedentary through Covid  CT scan in July 2020   pulm scarring fibrosis in the apices Followed by pulmonary,  airway reactivity  Stress test August 2021, no ischemia  CT chest 01/2019 Aortic Atherosclerosis  Diffuse coronary artery  calcifications, particularly dense within the left anterior descending coronary artery  Echocardiogram performed January 07, 2020 Normal ejection fraction, normal right heart pressures, normal RV function Mild to moderately TR  Echo in 2017: normal EF, normal RVSP  CT ABD: mild A athero There is 3 vessel coronary calcification  2017 and 2018   Past Medical History:  Diagnosis Date  . Acid reflux   . Allergy   . Anemia   . Anxiety    in past  . Arthritis    all over  . Asthma   . Atypical moles   . Bronchitis   . Bronchospasm    reactive with perfume/cologne and smoke  . CAD (coronary artery disease)    patient denies on preop of 10/24   . Candida esophagitis ([place])   . Carotid artery calcification, bilateral 04/2016  . Cataract   . Cervical stenosis of spine   . Chronic insomnia   . Complication of anesthesia    shoulder surgery- not all the way asleep  . Depression    in past  . Diverticulitis   . Diverticulosis   . Dizziness   . Elevated liver enzymes   . Family history of adverse reaction to anesthesia    sister- has problems with nausea and vomiting   . Fatigue   . Fatty liver   . Generalized headache    migraines  . GERD (gastroesophageal reflux disease)   . History of chemotherapy    topical cream for h/o skin CA  . History of kidney stones   . History of neuropathy  FH Dr. Posey Pronto neurology  . History of vertigo   . Hx of migraines   . Hyperlipidemia   . Hypothyroidism   . Insomnia   . Irritable bowel syndrome   . LLQ abdominal pain   . Melanoma ([place]) 2013  . Melanoma ([place])    x2 stg II melanoma  . Neuropathy, peripheral   . Peripheral neuropathy   . Pneumonia    hx of x 2  . PONV (postoperative nausea and vomiting)   . Pre-diabetes   . Retina disorder    left  . SCC (squamous cell carcinoma)    skin   . Stroke [facility])    ? TIA per Dr V per patient   . Thyroid disease   . Urine incontinence   . Vitamin D deficiency   . Weight gain      Past Surgical History:  Procedure Laterality Date  . BILATERAL SALPINGOOPHORECTOMY     ovarian cyst Dr. Ouida Sills and Dr. Alisia Ferrari 2008  . BREAST SURGERY     in 2014 bx   . BREAST SURGERY     augmentation with saline Dr. Hubbard Hartshorn 1995   . CHOLECYSTECTOMY N/A 01/28/2015   Procedure: LAPAROSCOPIC CHOLECYSTECTOMY WITH INTRAOPERATIVE CHOLANGIOGRAM;  Surgeon: Autumn Messing III, MD;  Location: [facility];  Service: General;  Laterality: N/A;  . CLOSED MANIPULATION SHOULDER     rt  . COLONOSCOPY WITH PROPOFOL N/A 11/20/2012   Procedure: COLONOSCOPY WITH PROPOFOL;  Surgeon: Garlan Fair, MD;  Location: [facility];  Service: Endoscopy;  Laterality: N/A;  . colonscopy     May 2014  . JOINT REPLACEMENT     RTK  . KNEE ARTHROSCOPY    . left knee miniscus tear repair    . MELANOMA EXCISION WITH SENTINEL LYMPH NODE BIOPSY  03/18/11   Back, nodes neg  . OOPHORECTOMY    . ROTATOR CUFF REPAIR     left  . SHOULDER SURGERY     right and left   . SKIN CANCER EXCISION     multiple  . TOTAL HIP ARTHROPLASTY    . TOTAL KNEE ARTHROPLASTY     right 2010 and left in 2018   . TOTAL KNEE ARTHROPLASTY Left 05/16/2017   Procedure: LEFT TOTAL KNEE ARTHROPLASTY;  Surgeon: Paralee Cancel, MD;  Location: [facility];  Service: Orthopedics;  Laterality: Left;  70 mins  . TUBAL LIGATION    . TUMOR REMOVAL     ovary    Current Medications: Current Meds  Medication Sig  . albuterol (PROAIR HFA) 108 (90 Base) MCG/ACT inhaler Inhale 1-2 puffs into the lungs every 6 (six) hours as needed for wheezing or shortness of breath.  . Ascorbic Acid (VITAMIN C) 1000 MG tablet Take 1,000 mg by mouth daily.  Marland Kitchen aspirin 81 MG tablet TAKE 1 TABLET BY MOUTH EVERY DAY  . atorvastatin (LIPITOR) 20 MG tablet Take 1 tablet (20 mg total) by mouth daily.  . Calcium 600-200 MG-UNIT tablet Take 1 tablet daily by mouth.  . Cholecalciferol (VITAMIN D3) 2000 UNITS capsule Take 2,000 Units by mouth daily.  Marland Kitchen FOLIC ACID PO Take by mouth  daily.  Marland Kitchen levothyroxine (SYNTHROID) 75 MCG tablet Take 1 tablet (75 mcg total) by mouth daily before breakfast.  . Magnesium 250 MG TABS Take 250 mg by mouth daily.  Marland Kitchen olmesartan (BENICAR) 20 MG tablet Take 0.5 tablets (10 mg total) by mouth daily. If BP>130/>80 take another 1/2 pill  . Omega-3 Fatty Acids (FISH OIL) 1000  MG CAPS Take 1,000 mg by mouth daily.   Marland Kitchen omeprazole (PRILOSEC) 20 MG capsule Take 1 capsule (20 mg total) by mouth 2 (two) times daily before a meal.  . sertraline (ZOLOFT) 25 MG tablet TAKE 1 TABLET BY MOUTH EVERY DAY  . vitamin B-12 (CYANOCOBALAMIN) 1000 MCG tablet Take 2,000 mcg by mouth daily. 2 tabs  . Zinc 50 MG CAPS Take 50 mg by mouth daily.  . [DISCONTINUED] amLODipine (NORVASC) 2.5 MG tablet Take 1 tablet (2.5 mg total) by mouth daily. In am with food  . [DISCONTINUED] amLODipine (NORVASC) 5 MG tablet TAKE 1 TABLET (5 MG TOTAL) BY MOUTH DAILY. IN AM WITH FOOD     Allergies:   Epinephrine, Penicillins, Lidocaine hcl, Other, and Valium [diazepam]   Social History   Socioeconomic History  . Marital status: Divorced    Spouse name: Not on file  . Number of children: 3  . Years of education: College  . Highest education level: Some college, no degree  Occupational History  . Occupation: Retired    Fish farm manager: RETIRED  Tobacco Use  . Smoking status: Never Smoker  . Smokeless tobacco: Never Used  Vaping Use  . Vaping Use: Never used  Substance and Sexual Activity  . Alcohol use: Yes    Alcohol/week: 1.0 standard drink    Types: 1 Cans of beer per week    Comment: once a week beer or wine  . Drug use: No  . Sexual activity: Not Currently  Other Topics Concern  . Not on file  Social History Narrative   Patient lives at home alone in a one story home.   Has 3 children (2 daughters and 1 son)    Retired from [employer]   Divorced since 1992    Caffeine Use: 1 cup daily and a soft drink occasionally    No guns    Wears seat belt     Lives  with cat    Left handed   Social Determinants of Health   Financial Resource Strain: Not on file  Food Insecurity: Not on file  Transportation Needs: Not on file  Physical Activity: Not on file  Stress: Not on file  Social Connections: Not on file     Family History: The patient's family history includes Alcohol abuse in her son; Alzheimer's disease in her paternal aunt, paternal aunt, paternal aunt, and paternal aunt; Arthritis in her sister and sister; COPD in her sister; Cancer in her maternal grandfather, sister, and another family member; Colon cancer in her maternal grandfather; Colon polyps in her sister; Depression in her son; Diabetes in her father, sister, and son; Early death in her father, maternal grandfather, and paternal grandmother; Heart disease in her daughter, father, sister, sister, and son; Hyperlipidemia in her daughter, father, mother, sister, sister, and son; Hypertension in her daughter, father, mother, sister, and sister; Pancreatic cancer in her maternal uncle; Stroke in her maternal grandmother, mother, and paternal grandmother.  ROS:   Please see the history of present illness.    Review of Systems  Constitutional: Negative.   HENT: Negative.   Respiratory: Positive for shortness of breath.   Cardiovascular: Negative.   Gastrointestinal: Negative.   Musculoskeletal: Negative.   Neurological: Negative.   Psychiatric/Behavioral: Negative.   All other systems reviewed and are negative.   EKGs/Labs/Other Studies Reviewed:    The following studies were reviewed today:  Echo 12/2019 1. Left ventricular ejection fraction, by estimation, is 60 to 65%. The  left ventricle has normal function. The left ventricle has no regional  wall motion abnormalities. Left ventricular diastolic parameters are  consistent with age-related delayed  relaxation (normal).  2. Right ventricular systolic function is normal. The right ventricular  size is normal. There is  normal pulmonary artery systolic pressure.  3. The mitral valve is degenerative. Mild mitral valve regurgitation. No  evidence of mitral stenosis.  4. Tricuspid valve regurgitation is mild to moderate.  5. The aortic valve is tricuspid. Aortic valve regurgitation is not  visualized. No aortic stenosis is present.  6. The inferior vena cava is normal in size with greater than 50%  respiratory variability, suggesting right atrial pressure of 3 mmHg.   EKG:  EKG is  ordered today.  The ekg ordered today demonstrates normal sinus rhythm rate 59 bpm no significant ST-T wave changes  Recent Labs: 09/08/2020: ALT 11; Hemoglobin 13.2; Platelets 205.0 09/18/2020: BUN 19; Creatinine, Ser 0.81; Potassium 4.0; Sodium 141; TSH 1.540  Recent Lipid Panel    Component Value Date/Time   CHOL 142 03/25/2020 0830   TRIG 99.0 03/25/2020 0830   HDL 69.80 03/25/2020 0830   CHOLHDL 2 03/25/2020 0830   VLDL 19.8 03/25/2020 0830   LDLCALC 52 03/25/2020 0830       Physical Exam:    VS:  BP 120/70 (BP Location: Left Arm, Patient Position: Sitting, Cuff Size: Large)   Pulse 62   Ht 5\' 6"  (1.676 m)   Wt 190 lb 6 oz (86.4 kg)   SpO2 98%   BMI 30.73 kg/m     Wt Readings from Last 3 Encounters:  10/23/20 190 lb 6 oz (86.4 kg)  10/06/20 189 lb 6.4 oz (85.9 kg)  09/18/20 190 lb 2 oz (86.2 kg)     Constitutional:  oriented to person, place, and time. No distress.  HENT:  Head: Grossly normal Eyes:  no discharge. No scleral icterus.  Neck: No JVD, no carotid bruits  Cardiovascular: Regular rate and rhythm, no murmurs appreciated Pulmonary/Chest: Clear to auscultation bilaterally,  Scattered Rales Abdominal: Soft.  no distension.  no tenderness.  Musculoskeletal: Normal range of motion Neurological:  normal muscle tone. Coordination normal. No atrophy Skin: Skin warm and dry Psychiatric: normal affect, pleasant  ASSESSMENT:    1. Coronary artery disease of native artery of native heart with  stable angina pectoris ([place])   2. PAD (peripheral artery disease) ([place])   3. Essential hypertension    PLAN:    Chronic shortness of breath Deconditioning, Stressed the need for weight loss, exercise program She is reluctant again to participate in lung works despite them calling her to reengage She does not have a good exercise program at home, used to go to the gym years ago, nothing recent -Expressed to her that just doing her ADLs is not enough No further cardiac testing needed, Recent stress test with no significant ischemia Echocardiogram normal ejection fraction  Obesity We have encouraged continued exercise, careful diet management in an effort to lose weight.  Scarring of lung/shortness of breath Had PNA in the past Followed by pulmonary, Sedentary.  Stressed importance of better conditioning, weight loss, Legs are getting weak, high risk of falls in the future All this was discussed with her in detai  Leg swelling Reports having dizziness, low blood pressures at time 270 systolic We will hold her amlodipine which is not a good option in the setting of leg swelling requiring compression hose, lymphedema pumps, followed by vascular Recommend she call  us with blood pressure measurements in the next week or 2  Essential hypertension Hold amlodipine as above given leg swelling If blood pressure runs high would increase olmesartan from 10 to 20 mg   Total encounter time more than 25 minutes  Greater than 50% was spent in counseling and coordination of care with the patient    Signed, Ida Rogue, MD  10/23/2020 1:12 PM    [facility]

## 2020-10-23 ENCOUNTER — Other Ambulatory Visit: Payer: Self-pay

## 2020-10-23 ENCOUNTER — Ambulatory Visit: Payer: PPO | Admitting: Cardiovascular Disease

## 2020-10-23 ENCOUNTER — Encounter: Payer: Self-pay | Admitting: Cardiovascular Disease

## 2020-10-23 VITALS — BP 120/70 | HR 62 | Ht 66.0 in | Wt 190.4 lb

## 2020-10-23 DIAGNOSIS — I25118 Atherosclerotic heart disease of native coronary artery with other forms of angina pectoris: Secondary | ICD-10-CM | POA: Diagnosis not present

## 2020-10-23 DIAGNOSIS — I739 Peripheral vascular disease, unspecified: Secondary | ICD-10-CM

## 2020-10-23 DIAGNOSIS — I10 Essential (primary) hypertension: Secondary | ICD-10-CM | POA: Diagnosis not present

## 2020-10-23 NOTE — Patient Instructions (Addendum)
Medication Instructions:  STOP  Amlodipine  See if the dizziness gets better  Monitor blood pressure, call with numbers or upload them to your MyChart  Lab work: No new labs needed  Testing/Procedures: No new testing needed  Follow-Up:  . You will need a follow up appointment in 12 months  . Providers on your designated Care Team:   . Murray Hodgkins, NP . Christell Faith, PA-C . Marrianne Mood, PA-C   COVID-19 Vaccine Information can be found at: [email or web address] For questions related to vaccine distribution or appointments, please email [email or web address] or call [phone].   Please monitor blood pressures and keep a log of your readings.  Call the clinic in 1-2 weeks with BP readings or may also upload them to your MyChart for review   How to use a home blood pressure monitor. . Be still. . Measure at the same time every day. It's important to take the readings at the same time each day, such as morning and evening. Take reading approximately 1 1/2 to 2 hours after BP medications.   AVOID these things for 30 minutes before checking your blood pressure:  Drinking caffeine.  Drinking alcohol.  Eating.  Smoking.  Exercising.   Five minutes before checking your blood pressure:  Pee.  Sit in a dining chair. Avoid sitting in a soft couch or armchair.  Be quiet. Do not talk.       Sit correctly. Sit with your back straight and supported (on a dining chair, rather than a sofa). Your feet should be flat on the floor and your legs should not be crossed. Your arm should be supported on a flat surface (such as a table) with the upper arm at heart level. Make sure the bottom of the cuff is placed directly above the bend of the elbow.

## 2020-10-26 ENCOUNTER — Telehealth: Payer: Self-pay

## 2020-10-26 NOTE — Telephone Encounter (Signed)
Left message for patient to discuss referral to Pulmonary Rehab.

## 2020-10-27 ENCOUNTER — Telehealth: Payer: Self-pay | Admitting: Pulmonary Disease

## 2020-10-28 MED ORDER — OMEPRAZOLE 20 MG PO CPDR: 20.0000 mg | DELAYED_RELEASE_CAPSULE | Freq: Two times a day (BID) | ORAL | 3 refills | Status: DC

## 2020-10-28 NOTE — Telephone Encounter (Signed)
Rx for Prilosec 20mg  has been sent to preferred pharmacy.  Patient is aware and voiced her understanding.  Nothing further needed.

## 2020-10-28 NOTE — Telephone Encounter (Signed)
Lm for patient.  

## 2020-10-29 NOTE — Telephone Encounter (Signed)
Left message again asking for call back.

## 2020-11-02 ENCOUNTER — Telehealth: Payer: Self-pay

## 2020-11-02 DIAGNOSIS — R0600 Dyspnea, unspecified: Secondary | ICD-10-CM

## 2020-11-02 DIAGNOSIS — R0609 Other forms of dyspnea: Secondary | ICD-10-CM

## 2020-11-02 NOTE — Progress Notes (Addendum)
Discharge Progress Report  Patient Details  Name: [name] MRN: [id number] Date of Birth: 1940-10-03 Referring Provider:   Flowsheet Row Pulmonary Rehab from 08/17/2020 in [name]  Referring Provider Ida Rogue MD       Number of Visits: 1  Reason for Discharge:  Early Exit:  Medical  Diagnosis:  DOE (dyspnea on exertion)  ADL UCSD:  Pulmonary Assessment Scores    Row Name 08/17/20 1630         ADL UCSD   ADL Phase Entry     SOB Score total 32     Rest 1     Walk 2     Stairs 4     Bath 1     Dress 1     Shop 1           CAT Score   CAT Score 14           mMRC Score   mMRC Score 3            Initial Exercise Prescription:  Initial Exercise Prescription - 08/17/20 1600      Date of Initial Exercise RX and Referring Provider   Date 08/17/20    Referring Provider Ida Rogue MD      NuStep   Level 1    SPM 80    Minutes 15    METs 1.8      T5 Nustep   Level 1    SPM 80    Minutes 15    METs 1.8      Biostep-RELP   Level 1    SPM 50    Minutes 15    METs 1.8      Prescription Details   Frequency (times per week) 3    Duration Progress to 30 minutes of continuous aerobic without signs/symptoms of physical distress      Intensity   THRR 40-80% of Max Heartrate 94-125    Ratings of Perceived Exertion 11-13    Perceived Dyspnea 0-4      Progression   Progression Continue to progress workloads to maintain intensity without signs/symptoms of physical distress.      Resistance Training   Training Prescription Yes           Discharge Exercise Prescription (Final Exercise Prescription Changes):  Exercise Prescription Changes - 08/17/20 1600      Response to Exercise   Blood Pressure (Admit) 116/62    Blood Pressure (Exercise) 152/64    Blood Pressure (Exit) 118/64    Heart Rate (Admit) 63 bpm    Heart Rate (Exercise) 101 bpm    Heart Rate (Exit) 66 bpm    Oxygen Saturation (Admit) 96 %     Oxygen Saturation (Exercise) 98 %    Oxygen Saturation (Exit) 99 %    Rating of Perceived Exertion (Exercise) 13    Perceived Dyspnea (Exercise) 3    Symptoms SOB, dizziness (her usual)    Comments walk test results      Resistance Training   Weight 3 lb    Reps 10-15           Functional Capacity:  6 Minute Walk    Row Name 08/24/20 1251         6 Minute Walk   Phase Initial     Distance 973 feet     Walk Time 6 minutes     # of Rest Breaks 0  MPH 1.84     METS 1.8     RPE 13     Perceived Dyspnea  3     VO2 Peak 6.31     Symptoms Yes (comment)     Comments SOB, dizziness (this is her normal)     Resting HR 63 bpm     Resting BP 116/62     Resting Oxygen Saturation  96 %     Exercise Oxygen Saturation  during 6 min walk 99 %     Max Ex. HR 101 bpm     Max Ex. BP 152/64     2 Minute Post BP 118/63           Interval HR   1 Minute HR 86     2 Minute HR 96     3 Minute HR 101     4 Minute HR 101     5 Minute HR 100     6 Minute HR 100     2 Minute Post HR 79     Interval Heart Rate? Yes           Interval Oxygen   Interval Oxygen? Yes     Baseline Oxygen Saturation % 96 %     1 Minute Oxygen Saturation % 98 %     1 Minute Liters of Oxygen 0 L  RA     2 Minute Oxygen Saturation % 98 %     2 Minute Liters of Oxygen 0 L     3 Minute Oxygen Saturation % 99 %     3 Minute Liters of Oxygen 0 L     4 Minute Oxygen Saturation % 99 %     4 Minute Liters of Oxygen 0 L     5 Minute Oxygen Saturation % 99 %     5 Minute Liters of Oxygen 0 L     6 Minute Oxygen Saturation % 99 %     6 Minute Liters of Oxygen 0 L     2 Minute Post Oxygen Saturation % 100 %     2 Minute Post Liters of Oxygen 0 L            Psychological, QOL, Others - Outcomes: PHQ 2/9: Depression screen [facility] 2/9 08/17/2020 08/10/2020 02/28/2020 11/01/2019 08/08/2019  Decreased Interest 1 1 0 0 0  Down, Depressed, Hopeless 1 0 0 0 1  PHQ - 2 Score 2 1 0 0 1  Altered sleeping 2 - 0 - -   Tired, decreased energy 1 - 0 - -  Change in appetite 1 - 0 - -  Feeling bad or failure about yourself  1 - 0 - -  Trouble concentrating 0 - 0 - -  Moving slowly or fidgety/restless 0 - 0 - -  Suicidal thoughts 0 - 0 - -  PHQ-9 Score 7 - 0 - -  Difficult doing work/chores Not difficult at all - Not difficult at all - -  Some recent data might be hidden      Nutrition & Weight - Outcomes:  Pre Biometrics - 08/17/20 1639      Pre Biometrics   Height 5\' 5"  (1.651 m)    Weight 183 lb 1.6 oz (83.1 kg)    BMI (Calculated) 30.47    Single Leg Stand 2.44 seconds              Goals reviewed with patient; copy given to patient.

## 2020-11-02 NOTE — Telephone Encounter (Signed)
Patient called back- informed patient she would need to come back for a brand new orientation as she has had medical changes since her initial appointment. Patient also stated she is supposed to get leg surgery but not sure when yet. We agreed to discharge her at this time as she has not been here and have her contact her doctor for a new referral when she is ready/cleared to come back the Pulmonary Rehab. Patient understood.

## 2020-11-02 NOTE — Progress Notes (Signed)
Pulmonary Individual Treatment Plan  Patient Details  Name: [name] MRN: [id number] Date of Birth: 1940-09-06 Referring Provider:   Flowsheet Row Pulmonary Rehab from 08/17/2020 in [facility] Cardiac and Pulmonary Rehab  Referring Provider Leah Rogue MD      Initial Encounter Date:  Flowsheet Row Pulmonary Rehab from 08/17/2020 in [facility] Cardiac and Pulmonary Rehab  Date 08/17/20      Visit Diagnosis: No diagnosis found.  Patient's Home Medications on Admission:  Current Outpatient Medications:  .  albuterol (PROAIR HFA) 108 (90 Base) MCG/ACT inhaler, Inhale 1-2 puffs into the lungs every 6 (six) hours as needed for wheezing or shortness of breath., Disp: 18 g, Rfl: 11 .  Ascorbic Acid (VITAMIN C) 1000 MG tablet, Take 1,000 mg by mouth daily., Disp: , Rfl:  .  aspirin 81 MG tablet, TAKE 1 TABLET BY MOUTH EVERY DAY, Disp: , Rfl:  .  atorvastatin (LIPITOR) 20 MG tablet, Take 1 tablet (20 mg total) by mouth daily., Disp: 90 tablet, Rfl: 3 .  Calcium 600-200 MG-UNIT tablet, Take 1 tablet daily by mouth., Disp: , Rfl:  .  Cholecalciferol (VITAMIN D3) 2000 UNITS capsule, Take 2,000 Units by mouth daily., Disp: , Rfl:  .  FOLIC ACID PO, Take by mouth daily., Disp: , Rfl:  .  levothyroxine (SYNTHROID) 75 MCG tablet, Take 1 tablet (75 mcg total) by mouth daily before breakfast., Disp: 90 tablet, Rfl: 3 .  Magnesium 250 MG TABS, Take 250 mg by mouth daily., Disp: , Rfl:  .  olmesartan (BENICAR) 20 MG tablet, Take 0.5 tablets (10 mg total) by mouth daily. If BP>130/>80 take another 1/2 pill, Disp: 45 tablet, Rfl: 3 .  Omega-3 Fatty Acids (FISH OIL) 1000 MG CAPS, Take 1,000 mg by mouth daily. , Disp: , Rfl:  .  omeprazole (PRILOSEC) 20 MG capsule, Take 1 capsule (20 mg total) by mouth 2 (two) times daily before a meal., Disp: 60 capsule, Rfl: 3 .  sertraline (ZOLOFT) 25 MG tablet, TAKE 1 TABLET BY MOUTH EVERY DAY, Disp: 90 tablet, Rfl: 1 .  vitamin B-12 (CYANOCOBALAMIN) 1000 MCG tablet, Take  2,000 mcg by mouth daily. 2 tabs, Disp: , Rfl:  .  Zinc 50 MG CAPS, Take 50 mg by mouth daily., Disp: , Rfl:   Past Medical History: Past Medical History:  Diagnosis Date  . Acid reflux   . Allergy   . Anemia   . Anxiety    in past  . Arthritis    all over  . Asthma   . Atypical moles   . Bronchitis   . Bronchospasm    reactive with perfume/cologne and smoke  . CAD (coronary artery disease)    patient denies on preop of 10/24   . Candida esophagitis ([place])   . Carotid artery calcification, bilateral 04/2016  . Cataract   . Cervical stenosis of spine   . Chronic insomnia   . Complication of anesthesia    shoulder surgery- not all the way asleep  . Depression    in past  . Diverticulitis   . Diverticulosis   . Dizziness   . Elevated liver enzymes   . Family history of adverse reaction to anesthesia    sister- has problems with nausea and vomiting   . Fatigue   . Fatty liver   . Generalized headache    migraines  . GERD (gastroesophageal reflux disease)   . History of chemotherapy    topical cream for h/o skin CA  .  History of kidney stones   . History of neuropathy    FH Dr. Posey Pronto neurology  . History of vertigo   . Hx of migraines   . Hyperlipidemia   . Hypothyroidism   . Insomnia   . Irritable bowel syndrome   . LLQ abdominal pain   . Melanoma ([place]) 2013  . Melanoma ([place])    x2 stg II melanoma  . Neuropathy, peripheral   . Peripheral neuropathy   . Pneumonia    hx of x 2  . PONV (postoperative nausea and vomiting)   . Pre-diabetes   . Retina disorder    left  . SCC (squamous cell carcinoma)    skin   . Stroke [facility])    ? TIA per Dr V per patient   . Thyroid disease   . Urine incontinence   . Vitamin D deficiency   . Weight gain     Tobacco Use: Social History   Tobacco Use  Smoking Status Never Smoker  Smokeless Tobacco Never Used    Labs: Recent Review Flowsheet Data    Labs for ITP Cardiac and Pulmonary Rehab Latest Ref Rng & Units  12/22/2014 05/10/2017 12/05/2018 09/09/2019 03/25/2020   Cholestrol 0 - 200 mg/dL 181 - 148 162 142   LDLCALC 0 - 99 mg/dL 108(H) - 58 60 52   HDL >39.00 mg/dL 57 - 71.60 83.50 69.80   Trlycerides 0.0 - 149.0 mg/dL 79 - 94.0 91.0 99.0   Hemoglobin A1c 4.6 - 6.5 % - 5.9(H) 6.2 6.0 6.1       Pulmonary Assessment Scores:  Pulmonary Assessment Scores    Row Name 08/17/20 1630         ADL UCSD   ADL Phase Entry     SOB Score total 32     Rest 1     Walk 2     Stairs 4     Bath 1     Dress 1     Shop 1           CAT Score   CAT Score 14           mMRC Score   mMRC Score 3            UCSD: Self-administered rating of dyspnea associated with activities of daily living (ADLs) 6-point scale (0 = "not at all" to 5 = "maximal or unable to do because of breathlessness")  Scoring Scores range from 0 to 120.  Minimally important difference is 5 units  CAT: CAT can identify the health impairment of COPD patients and is better correlated with disease progression.  CAT has a scoring range of zero to 40. The CAT score is classified into four groups of low (less than 10), medium (10 - 20), high (21-30) and very high (31-40) based on the impact level of disease on health status. A CAT score over 10 suggests significant symptoms.  A worsening CAT score could be explained by an exacerbation, poor medication adherence, poor inhaler technique, or progression of COPD or comorbid conditions.  CAT MCID is 2 points  mMRC: mMRC (Modified Medical Research Council) Dyspnea Scale is used to assess the degree of baseline functional disability in patients of respiratory disease due to dyspnea. No minimal important difference is established. A decrease in score of 1 point or greater is considered a positive change.   Pulmonary Function Assessment:   Exercise Target Goals: Exercise Program Goal: Individual exercise prescription set using results from  initial 6 min walk test and THRR while considering   patient's activity barriers and safety.   Exercise Prescription Goal: Initial exercise prescription builds to 30-45 minutes a day of aerobic activity, 2-3 days per week.  Home exercise guidelines will be given to patient during program as part of exercise prescription that the participant will acknowledge.  Education: Aerobic Exercise: - Group verbal and visual presentation on the components of exercise prescription. Introduces F.I.T.T principle from ACSM for exercise prescriptions.  Reviews F.I.T.T. principles of aerobic exercise including progression. Written material given at graduation.   Education: Resistance Exercise: - Group verbal and visual presentation on the components of exercise prescription. Introduces F.I.T.T principle from ACSM for exercise prescriptions  Reviews F.I.T.T. principles of resistance exercise including progression. Written material given at graduation.    Education: Exercise & Equipment Safety: - Individual verbal instruction and demonstration of equipment use and safety with use of the equipment. Flowsheet Row Pulmonary Rehab from 08/10/2020 in [facility] Cardiac and Pulmonary Rehab  Date 08/10/20  Educator [facility]  Instruction Review Code 1- Verbalizes Understanding      Education: Exercise Physiology & General Exercise Guidelines: - Group verbal and written instruction with models to review the exercise physiology of the cardiovascular system and associated critical values. Provides general exercise guidelines with specific guidelines to those with heart or lung disease.    Education: Flexibility, Balance, Mind/Body Relaxation: - Group verbal and visual presentation with interactive activity on the components of exercise prescription. Introduces F.I.T.T principle from ACSM for exercise prescriptions. Reviews F.I.T.T. principles of flexibility and balance exercise training including progression. Also discusses the mind body connection.  Reviews various relaxation  techniques to help reduce and manage stress (i.e. Deep breathing, progressive muscle relaxation, and visualization). Balance handout provided to take home. Written material given at graduation.   Activity Barriers & Risk Stratification:  Activity Barriers & Cardiac Risk Stratification - 08/17/20 1638      Activity Barriers & Cardiac Risk Stratification   Activity Barriers Arthritis;Shortness of Breath;Left Hip Replacement;Left Knee Replacement;Other (comment)    Comments Rotator cuff surgery, L shoulder; Had accident on R side, completely torn R shoulder with surgery           6 Minute Walk:  6 Minute Walk    Row Name 08/24/20 1251         6 Minute Walk   Phase Initial     Distance 973 feet     Walk Time 6 minutes     # of Rest Breaks 0     MPH 1.84     METS 1.8     RPE 13     Perceived Dyspnea  3     VO2 Peak 6.31     Symptoms Yes (comment)     Comments SOB, dizziness (this is her normal)     Resting HR 63 bpm     Resting BP 116/62     Resting Oxygen Saturation  96 %     Exercise Oxygen Saturation  during 6 min walk 99 %     Max Ex. HR 101 bpm     Max Ex. BP 152/64     2 Minute Post BP 118/63           Interval HR   1 Minute HR 86     2 Minute HR 96     3 Minute HR 101     4 Minute HR 101     5 Minute HR 100  6 Minute HR 100     2 Minute Post HR 79     Interval Heart Rate? Yes           Interval Oxygen   Interval Oxygen? Yes     Baseline Oxygen Saturation % 96 %     1 Minute Oxygen Saturation % 98 %     1 Minute Liters of Oxygen 0 L  RA     2 Minute Oxygen Saturation % 98 %     2 Minute Liters of Oxygen 0 L     3 Minute Oxygen Saturation % 99 %     3 Minute Liters of Oxygen 0 L     4 Minute Oxygen Saturation % 99 %     4 Minute Liters of Oxygen 0 L     5 Minute Oxygen Saturation % 99 %     5 Minute Liters of Oxygen 0 L     6 Minute Oxygen Saturation % 99 %     6 Minute Liters of Oxygen 0 L     2 Minute Post Oxygen Saturation % 100 %     2 Minute  Post Liters of Oxygen 0 L           Oxygen Initial Assessment:  Oxygen Initial Assessment - 08/17/20 1629      Home Oxygen   Home Oxygen Device None    Sleep Oxygen Prescription None    Home Exercise Oxygen Prescription None    Home Resting Oxygen Prescription None    Compliance with Home Oxygen Use Yes      Initial 6 min Walk   Oxygen Used None      Program Oxygen Prescription   Program Oxygen Prescription None      Intervention   Short Term Goals To learn and demonstrate proper use of respiratory medications;To learn and demonstrate proper pursed lip breathing techniques or other breathing techniques.;To learn and understand importance of maintaining oxygen saturations>88%;To learn and understand importance of monitoring SPO2 with pulse oximeter and demonstrate accurate use of the pulse oximeter.    Long  Term Goals Verbalizes importance of monitoring SPO2 with pulse oximeter and return demonstration;Maintenance of O2 saturations>88%;Exhibits proper breathing techniques, such as pursed lip breathing or other method taught during program session;Compliance with respiratory medication;Demonstrates proper use of MDI's           Oxygen Re-Evaluation:   Oxygen Discharge (Final Oxygen Re-Evaluation):   Initial Exercise Prescription:  Initial Exercise Prescription - 08/17/20 1600      Date of Initial Exercise RX and Referring Provider   Date 08/17/20    Referring Provider Leah Rogue MD      NuStep   Level 1    SPM 80    Minutes 15    METs 1.8      T5 Nustep   Level 1    SPM 80    Minutes 15    METs 1.8      Biostep-RELP   Level 1    SPM 50    Minutes 15    METs 1.8      Prescription Details   Frequency (times per week) 3    Duration Progress to 30 minutes of continuous aerobic without signs/symptoms of physical distress      Intensity   THRR 40-80% of Max Heartrate 94-125    Ratings of Perceived Exertion 11-13    Perceived Dyspnea 0-4       Progression  Progression Continue to progress workloads to maintain intensity without signs/symptoms of physical distress.      Resistance Training   Training Prescription Yes           Perform Capillary Blood Glucose checks as needed.  Exercise Prescription Changes:  Exercise Prescription Changes    Row Name 08/17/20 1600             Response to Exercise   Blood Pressure (Admit) 116/62       Blood Pressure (Exercise) 152/64       Blood Pressure (Exit) 118/64       Heart Rate (Admit) 63 bpm       Heart Rate (Exercise) 101 bpm       Heart Rate (Exit) 66 bpm       Oxygen Saturation (Admit) 96 %       Oxygen Saturation (Exercise) 98 %       Oxygen Saturation (Exit) 99 %       Rating of Perceived Exertion (Exercise) 13       Perceived Dyspnea (Exercise) 3       Symptoms SOB, dizziness (her usual)       Comments walk test results               Resistance Training   Weight 3 lb       Reps 10-15              Exercise Comments:   Exercise Goals and Review:  Exercise Goals    Row Name 08/17/20 1655             Exercise Goals   Increase Physical Activity Yes       Intervention Provide advice, education, support and counseling about physical activity/exercise needs.;Develop an individualized exercise prescription for aerobic and resistive training based on initial evaluation findings, risk stratification, comorbidities and participant's personal goals.       Expected Outcomes Short Term: Attend rehab on a regular basis to increase amount of physical activity.;Long Term: Add in home exercise to make exercise part of routine and to increase amount of physical activity.;Long Term: Exercising regularly at least 3-5 days a week.       Increase Strength and Stamina Yes       Intervention Provide advice, education, support and counseling about physical activity/exercise needs.;Develop an individualized exercise prescription for aerobic and resistive training based on  initial evaluation findings, risk stratification, comorbidities and participant's personal goals.       Expected Outcomes Short Term: Increase workloads from initial exercise prescription for resistance, speed, and METs.;Short Term: Perform resistance training exercises routinely during rehab and add in resistance training at home;Long Term: Improve cardiorespiratory fitness, muscular endurance and strength as measured by increased METs and functional capacity (6MWT)       Able to understand and use rate of perceived exertion (RPE) scale Yes       Intervention Provide education and explanation on how to use RPE scale       Expected Outcomes Short Term: Able to use RPE daily in rehab to express subjective intensity level;Long Term:  Able to use RPE to guide intensity level when exercising independently       Able to understand and use Dyspnea scale Yes       Intervention Provide education and explanation on how to use Dyspnea scale       Expected Outcomes Short Term: Able to use Dyspnea scale daily in rehab to express subjective sense  of shortness of breath during exertion;Long Term: Able to use Dyspnea scale to guide intensity level when exercising independently       Knowledge and understanding of Target Heart Rate Range (THRR) Yes       Intervention Provide education and explanation of THRR including how the numbers were predicted and where they are located for reference       Expected Outcomes Short Term: Able to state/look up THRR;Short Term: Able to use daily as guideline for intensity in rehab;Long Term: Able to use THRR to govern intensity when exercising independently       Able to check pulse independently Yes       Intervention Provide education and demonstration on how to check pulse in carotid and radial arteries.;Review the importance of being able to check your own pulse for safety during independent exercise       Expected Outcomes Short Term: Able to explain why pulse checking is  important during independent exercise;Long Term: Able to check pulse independently and accurately       Understanding of Exercise Prescription Yes       Intervention Provide education, explanation, and written materials on patient's individual exercise prescription       Expected Outcomes Short Term: Able to explain program exercise prescription;Long Term: Able to explain home exercise prescription to exercise independently              Exercise Goals Re-Evaluation :  Exercise Goals Re-Evaluation    Row Name 09/09/20 1145 10/07/20 1359           Exercise Goal Re-Evaluation   Comments Has yet to start rehab due to medical concerns Has yet to start rehab due to medical concerns             Discharge Exercise Prescription (Final Exercise Prescription Changes):  Exercise Prescription Changes - 08/17/20 1600      Response to Exercise   Blood Pressure (Admit) 116/62    Blood Pressure (Exercise) 152/64    Blood Pressure (Exit) 118/64    Heart Rate (Admit) 63 bpm    Heart Rate (Exercise) 101 bpm    Heart Rate (Exit) 66 bpm    Oxygen Saturation (Admit) 96 %    Oxygen Saturation (Exercise) 98 %    Oxygen Saturation (Exit) 99 %    Rating of Perceived Exertion (Exercise) 13    Perceived Dyspnea (Exercise) 3    Symptoms SOB, dizziness (her usual)    Comments walk test results      Resistance Training   Weight 3 lb    Reps 10-15           Nutrition:  Target Goals: Understanding of nutrition guidelines, daily intake of sodium 1500mg , cholesterol 200mg , calories 30% from fat and 7% or less from saturated fats, daily to have 5 or more servings of fruits and vegetables.  Education: All About Nutrition: -Group instruction provided by verbal, written material, interactive activities, discussions, models, and posters to present general guidelines for heart healthy nutrition including fat, fiber, MyPlate, the role of sodium in heart healthy nutrition, utilization of the nutrition  label, and utilization of this knowledge for meal planning. Follow up email sent as well. Written material given at graduation.   Biometrics:  Pre Biometrics - 08/17/20 1639      Pre Biometrics   Height 5\' 5"  (1.651 m)    Weight 183 lb 1.6 oz (83.1 kg)    BMI (Calculated) 30.47    Single Leg Stand  2.44 seconds            Nutrition Therapy Plan and Nutrition Goals:   Nutrition Assessments:  MEDIFICTS Score Key:  ?70 Need to make dietary changes   40-70 Heart Healthy Diet  ? 40 Therapeutic Level Cholesterol Diet   Picture Your Plate Scores:  <00 Unhealthy dietary pattern with much room for improvement.  41-50 Dietary pattern unlikely to meet recommendations for good health and room for improvement.  51-60 More healthful dietary pattern, with some room for improvement.   >60 Healthy dietary pattern, although there may be some specific behaviors that could be improved.   Nutrition Goals Re-Evaluation:   Nutrition Goals Discharge (Final Nutrition Goals Re-Evaluation):   Psychosocial: Target Goals: Acknowledge presence or absence of significant depression and/or stress, maximize coping skills, provide positive support system. Participant is able to verbalize types and ability to use techniques and skills needed for reducing stress and depression.   Education: Stress, Anxiety, and Depression - Group verbal and visual presentation to define topics covered.  Reviews how body is impacted by stress, anxiety, and depression.  Also discusses healthy ways to reduce stress and to treat/manage anxiety and depression.  Written material given at graduation.   Education: Sleep Hygiene -Provides group verbal and written instruction about how sleep can affect your health.  Define sleep hygiene, discuss sleep cycles and impact of sleep habits. Review good sleep hygiene tips.    Initial Review & Psychosocial Screening:  Initial Psych Review & Screening - 08/10/20 1344       Initial Review   Current issues with Current Psychotropic Meds;Current Depression;History of Depression;Current Anxiety/Panic;Current Sleep Concerns      Family Dynamics   Good Support System? Yes    Comments Zigmund Daniel states that she has three children and she has been concerned about them. One of her daughters is married to a control freak andis concerned about her. She has had skin cancer and 5 years of oncology. She could not take care of her new puppy when she was going through therapy and had a contract with the vet to hold on to her and adopted her puppy out. Zigmund Daniel can look to her friends and other two kids for support.      Barriers   Psychosocial barriers to participate in program The patient should benefit from training in stress management and relaxation.      Screening Interventions   Interventions Encouraged to exercise;To provide support and resources with identified psychosocial needs;Provide feedback about the scores to participant    Expected Outcomes Short Term goal: Utilizing psychosocial counselor, staff and physician to assist with identification of specific Stressors or current issues interfering with healing process. Setting desired goal for each stressor or current issue identified.;Long Term Goal: Stressors or current issues are controlled or eliminated.;Short Term goal: Identification and review with participant of any Quality of Life or Depression concerns found by scoring the questionnaire.;Long Term goal: The participant improves quality of Life and PHQ9 Scores as seen by post scores and/or verbalization of changes           Quality of Life Scores:  Scores of 19 and below usually indicate a poorer quality of life in these areas.  A difference of  2-3 points is a clinically meaningful difference.  A difference of 2-3 points in the total score of the Quality of Life Index has been associated with significant improvement in overall quality of life, self-image, physical symptoms,  and general health in studies assessing  change in quality of life.  PHQ-9: Recent Review Flowsheet Data    Depression screen [facility] 2/9 08/17/2020 08/10/2020 02/28/2020 11/01/2019 08/08/2019   Decreased Interest 1 1 0 0 0   Down, Depressed, Hopeless 1 0 0 0 1   PHQ - 2 Score 2 1 0 0 1   Altered sleeping 2 - 0 - -   Tired, decreased energy 1 - 0 - -   Change in appetite 1 - 0 - -   Feeling bad or failure about yourself  1 - 0 - -   Trouble concentrating 0 - 0 - -   Moving slowly or fidgety/restless 0 - 0 - -   Suicidal thoughts 0 - 0 - -   PHQ-9 Score 7 - 0 - -   Difficult doing work/chores Not difficult at all - Not difficult at all - -     Interpretation of Total Score  Total Score Depression Severity:  1-4 = Minimal depression, 5-9 = Mild depression, 10-14 = Moderate depression, 15-19 = Moderately severe depression, 20-27 = Severe depression   Psychosocial Evaluation and Intervention:  Psychosocial Evaluation - 08/10/20 1348      Psychosocial Evaluation & Interventions   Interventions Encouraged to exercise with the program and follow exercise prescription;Stress management education;Relaxation education    Comments Zigmund Daniel states that she has three children and she has been concerned about them. One of her daughters is married to a control freak andis concerned about her. She has had skin cancer and 5 years of oncology. She could not take care of her new puppy when she was going through therapy and had a contract with the vet to hold on to her and adopted her puppy out. Zigmund Daniel can look to her friends and other two kids for support.    Expected Outcomes Short: Exercise regularly to support mental health and notify staff of any changes. Long: maintain mental health and well being through teaching of rehab or prescribed medications independently.    Continue Psychosocial Services  Follow up required by staff           Psychosocial Re-Evaluation:   Psychosocial Discharge (Final Psychosocial  Re-Evaluation):   Education: Education Goals: Education classes will be provided on a weekly basis, covering required topics. Participant will state understanding/return demonstration of topics presented.  Learning Barriers/Preferences:  Learning Barriers/Preferences - 08/10/20 1341      Learning Barriers/Preferences   Learning Barriers None    Learning Preferences None           General Pulmonary Education Topics:  Infection Prevention: - Provides verbal and written material to individual with discussion of infection control including proper hand washing and proper equipment cleaning during exercise session. Flowsheet Row Pulmonary Rehab from 08/10/2020 in [facility] Cardiac and Pulmonary Rehab  Date 08/10/20  Educator [facility]  Instruction Review Code 1- Verbalizes Understanding      Falls Prevention: - Provides verbal and written material to individual with discussion of falls prevention and safety. Flowsheet Row Pulmonary Rehab from 08/10/2020 in [facility] Card[facility] and Pulmonary Rehab  Date 08/10/20  Educator [facility]  Instruction Review Code 1- Verbalizes Understanding      Chronic Lung Disease Review: - Group verbal instruction with posters, models, PowerPoint presentations and videos,  to review new updates, new respiratory medications, new advancements in procedures and treatments. Providing information on websites and "800" numbers for continued self-education. Includes information about supplement oxygen, available portable oxygen systems, continuous and intermittent flow rates, oxygen safety, concentrators, and  Medicare reimbursement for oxygen. Explanation of Pulmonary Drugs, including class, frequency, complications, importance of spacers, rinsing mouth after steroid MDI's, and proper cleaning methods for nebulizers. Review of basic lung anatomy and physiology related to function, structure, and complications of lung disease. Review of risk factors. Discussion about methods for diagnosing  sleep apnea and types of masks and machines for OSA. Includes a review of the use of types of environmental controls: home humidity, furnaces, filters, dust mite/pet prevention, HEPA vacuums. Discussion about weather changes, air quality and the benefits of nasal washing. Instruction on Warning signs, infection symptoms, calling MD promptly, preventive modes, and value of vaccinations. Review of effective airway clearance, coughing and/or vibration techniques. Emphasizing that all should Create an Action Plan. Written material given at graduation.   AED/CPR: - Group verbal and written instruction with the use of models to demonstrate the basic use of the AED with the basic ABC's of resuscitation.    Anatomy and Cardiac Procedures: - Group verbal and visual presentation and models provide information about basic cardiac anatomy and function. Reviews the testing methods done to diagnose heart disease and the outcomes of the test results. Describes the treatment choices: Medical Management, Angioplasty, or Coronary Bypass Surgery for treating various heart conditions including Myocardial Infarction, Angina, Valve Disease, and Cardiac Arrhythmias.  Written material given at graduation.   Medication Safety: - Group verbal and visual instruction to review commonly prescribed medications for heart and lung disease. Reviews the medication, class of the drug, and side effects. Includes the steps to properly store meds and maintain the prescription regimen.  Written material given at graduation.   Other: -Provides group and verbal instruction on various topics (see comments)   Knowledge Questionnaire Score:  Knowledge Questionnaire Score - 08/17/20 1628      Knowledge Questionnaire Score   Pre Score 15/18: O2 sats, oxygen use            Core Components/Risk Factors/Patient Goals at Admission:  Personal Goals and Risk Factors at Admission - 08/17/20 1653      Core Components/Risk  Factors/Patient Goals on Admission    Weight Management Yes;Weight Loss    Intervention Weight Management: Develop a combined nutrition and exercise program designed to reach desired caloric intake, while maintaining appropriate intake of nutrient and fiber, sodium and fats, and appropriate energy expenditure required for the weight goal.;Weight Management: Provide education and appropriate resources to help participant work on and attain dietary goals.    Admit Weight 183 lb (83 kg)    Goal Weight: Short Term 178 lb (80.7 kg)    Goal Weight: Long Term 173 lb (78.5 kg)    Expected Outcomes Long Term: Adherence to nutrition and physical activity/exercise program aimed toward attainment of established weight goal;Short Term: Continue to assess and modify interventions until short term weight is achieved;Weight Loss: Understanding of general recommendations for a balanced deficit meal plan, which promotes 1-2 lb weight loss per week and includes a negative energy balance of [phone] kcal/d;Understanding recommendations for meals to include 15-35% energy as protein, 25-35% energy from fat, 35-60% energy from carbohydrates, less than 200mg  of dietary cholesterol, 20-35 gm of total fiber daily;Understanding of distribution of calorie intake throughout the day with the consumption of 4-5 meals/snacks    Hypertension Yes    Intervention Provide education on lifestyle modifcations including regular physical activity/exercise, weight management, moderate sodium restriction and increased consumption of fresh fruit, vegetables, and low fat dairy, alcohol moderation, and smoking cessation.;Monitor prescription use compliance.  Expected Outcomes Short Term: Continued assessment and intervention until BP is < 140/75mm HG in hypertensive participants. < 130/71mm HG in hypertensive participants with diabetes, heart failure or chronic kidney disease.;Long Term: Maintenance of blood pressure at goal levels.    Lipids Yes     Intervention Provide education and support for participant on nutrition & aerobic/resistive exercise along with prescribed medications to achieve LDL 70mg , HDL >40mg .    Expected Outcomes Short Term: Participant states understanding of desired cholesterol values and is compliant with medications prescribed. Participant is following exercise prescription and nutrition guidelines.;Long Term: Cholesterol controlled with medications as prescribed, with individualized exercise RX and with personalized nutrition plan. Value goals: LDL < 70mg , HDL > 40 mg.           Education:Diabetes - Individual verbal and written instruction to review signs/symptoms of diabetes, desired ranges of glucose level fasting, after meals and with exercise. Acknowledge that pre and post exercise glucose checks will be done for 3 sessions at entry of program.   Know Your Numbers and Heart Failure: - Group verbal and visual instruction to discuss disease risk factors for cardiac and pulmonary disease and treatment options.  Reviews associated critical values for Overweight/Obesity, Hypertension, Cholesterol, and Diabetes.  Discusses basics of heart failure: signs/symptoms and treatments.  Introduces Heart Failure Zone chart for action plan for heart failure.  Written material given at graduation.   Core Components/Risk Factors/Patient Goals Review:    Core Components/Risk Factors/Patient Goals at Discharge (Final Review):    ITP Comments:  ITP Comments    Row Name 08/10/20 1340 08/17/20 1626 08/27/20 1444 09/09/20 0621 09/09/20 1146   ITP Comments Virtual Visit completed. Patient informed on EP and RD appointment and 6 Minute walk test. Patient also informed of patient health questionnaires on My Chart. Patient Verbalizes understanding. Visit diagnosis can be found in [facility] 07/21/2020. Completed 6MWT and gym orientation. Initial ITP created and sent for review to Dr. Emily Filbert, Medical Director. Spoke with patient- she  was not comfortable coming to Pulmonary Rehab today as she reports she has had worse episodes of lightheadedness and dizziness, and also is in talk with her doctor about possible lymphedema. Encouraged her to call her doctor as soon as we hung up to report her worsening symptoms. Advised not to drive and call 010 if it is warranted. Will place patient on medical hold for rehab until she gets more information from her providers. Patient agreed with plan. 30 Day review completed. Medical Director ITP review done, changes made as directed, and signed approval by Medical Director. Continues to be out with medical concerns.  Vascular follow up on 2/24.  [place] requested cardiology look for lightheadedness   Row Name 09/14/20 1240 09/22/20 1122 10/07/20 0728 10/07/20 1359 11/02/20 1836   ITP Comments Patient is getting a holter monitor to look for any irregular rhythms- patient prefers to hold on rehab until that is completed. Discussed possible discharge with new referral depending how long patient will need the monitor for. Patient will call us once appointment is set up. Spoke with patient regarding rehab. She is getting a holter monitor for 2 weeks- and wants to wait until she gets those results back until she starts here. We will hold patient on medical hold but informed patient her time slot will be opened. Patient understands and will call us when she gets her results back. Next steps will be determined on her results/doctor orders going forward 30 Day review completed. Medical Director ITP review  done, changes made as directed, and signed approval by Medical Director.  Out for medical reason Currently on medical hold.  Next cardiology visit is 4/5.  We will check in at that point. Discharging patient at this time from Pulmonary rehab when patient is ready and cleared to go back. She will get a new referral from her doctor.          Comments: Discharge ITP

## 2020-11-03 ENCOUNTER — Telehealth (INDEPENDENT_AMBULATORY_CARE_PROVIDER_SITE_OTHER): Payer: Self-pay | Admitting: Vascular Surgery

## 2020-11-03 NOTE — Telephone Encounter (Signed)
Complete

## 2020-11-04 ENCOUNTER — Encounter: Payer: Self-pay | Admitting: *Deleted

## 2020-11-04 ENCOUNTER — Telehealth: Payer: Self-pay | Admitting: Cardiovascular Disease

## 2020-11-04 DIAGNOSIS — R0609 Other forms of dyspnea: Secondary | ICD-10-CM

## 2020-11-04 DIAGNOSIS — R0600 Dyspnea, unspecified: Secondary | ICD-10-CM

## 2020-11-04 NOTE — Progress Notes (Signed)
Pulmonary Individual Treatment Plan  Patient Details  Name: [name] MRN: [id number] Date of Birth: Apr 14, 1941 Referring Provider:   Flowsheet Row Pulmonary Rehab from 08/17/2020 in [facility] Cardiac and Pulmonary Rehab  Referring Provider Ida Rogue MD      Initial Encounter Date:  Flowsheet Row Pulmonary Rehab from 08/17/2020 in [facility] Cardiac and Pulmonary Rehab  Date 08/17/20      Visit Diagnosis: DOE (dyspnea on exertion)  Patient's Home Medications on Admission:  Current Outpatient Medications:  .  albuterol (PROAIR HFA) 108 (90 Base) MCG/ACT inhaler, Inhale 1-2 puffs into the lungs every 6 (six) hours as needed for wheezing or shortness of breath., Disp: 18 g, Rfl: 11 .  Ascorbic Acid (VITAMIN C) 1000 MG tablet, Take 1,000 mg by mouth daily., Disp: , Rfl:  .  aspirin 81 MG tablet, TAKE 1 TABLET BY MOUTH EVERY DAY, Disp: , Rfl:  .  atorvastatin (LIPITOR) 20 MG tablet, Take 1 tablet (20 mg total) by mouth daily., Disp: 90 tablet, Rfl: 3 .  Calcium 600-200 MG-UNIT tablet, Take 1 tablet daily by mouth., Disp: , Rfl:  .  Cholecalciferol (VITAMIN D3) 2000 UNITS capsule, Take 2,000 Units by mouth daily., Disp: , Rfl:  .  FOLIC ACID PO, Take by mouth daily., Disp: , Rfl:  .  levothyroxine (SYNTHROID) 75 MCG tablet, Take 1 tablet (75 mcg total) by mouth daily before breakfast., Disp: 90 tablet, Rfl: 3 .  Magnesium 250 MG TABS, Take 250 mg by mouth daily., Disp: , Rfl:  .  olmesartan (BENICAR) 20 MG tablet, Take 0.5 tablets (10 mg total) by mouth daily. If BP>130/>80 take another 1/2 pill, Disp: 45 tablet, Rfl: 3 .  Omega-3 Fatty Acids (FISH OIL) 1000 MG CAPS, Take 1,000 mg by mouth daily. , Disp: , Rfl:  .  omeprazole (PRILOSEC) 20 MG capsule, Take 1 capsule (20 mg total) by mouth 2 (two) times daily before a meal., Disp: 60 capsule, Rfl: 3 .  sertraline (ZOLOFT) 25 MG tablet, TAKE 1 TABLET BY MOUTH EVERY DAY, Disp: 90 tablet, Rfl: 1 .  vitamin B-12 (CYANOCOBALAMIN) 1000 MCG tablet,  Take 2,000 mcg by mouth daily. 2 tabs, Disp: , Rfl:  .  Zinc 50 MG CAPS, Take 50 mg by mouth daily., Disp: , Rfl:   Past Medical History: Past Medical History:  Diagnosis Date  . Acid reflux   . Allergy   . Anemia   . Anxiety    in past  . Arthritis    all over  . Asthma   . Atypical moles   . Bronchitis   . Bronchospasm    reactive with perfume/cologne and smoke  . CAD (coronary artery disease)    patient denies on preop of 10/24   . Candida esophagitis ([place])   . Carotid artery calcification, bilateral 04/2016  . Cataract   . Cervical stenosis of spine   . Chronic insomnia   . Complication of anesthesia    shoulder surgery- not all the way asleep  . Depression    in past  . Diverticulitis   . Diverticulosis   . Dizziness   . Elevated liver enzymes   . Family history of adverse reaction to anesthesia    sister- has problems with nausea and vomiting   . Fatigue   . Fatty liver   . Generalized headache    migraines  . GERD (gastroesophageal reflux disease)   . History of chemotherapy    topical cream for h/o skin CA  .  History of kidney stones   . History of neuropathy    FH Dr. Posey Pronto neurology  . History of vertigo   . Hx of migraines   . Hyperlipidemia   . Hypothyroidism   . Insomnia   . Irritable bowel syndrome   . LLQ abdominal pain   . Melanoma ([place]) 2013  . Melanoma ([place])    x2 stg II melanoma  . Neuropathy, peripheral   . Peripheral neuropathy   . Pneumonia    hx of x 2  . PONV (postoperative nausea and vomiting)   . Pre-diabetes   . Retina disorder    left  . SCC (squamous cell carcinoma)    skin   . Stroke [facility])    ? TIA per Dr V per patient   . Thyroid disease   . Urine incontinence   . Vitamin D deficiency   . Weight gain     Tobacco Use: Social History   Tobacco Use  Smoking Status Never Smoker  Smokeless Tobacco Never Used    Labs: Recent Review Flowsheet Data    Labs for ITP Cardiac and Pulmonary Rehab Latest Ref Rng &  Units 12/22/2014 05/10/2017 12/05/2018 09/09/2019 03/25/2020   Cholestrol 0 - 200 mg/dL 181 - 148 162 142   LDLCALC 0 - 99 mg/dL 108(H) - 58 60 52   HDL >39.00 mg/dL 57 - 71.60 83.50 69.80   Trlycerides 0.0 - 149.0 mg/dL 79 - 94.0 91.0 99.0   Hemoglobin A1c 4.6 - 6.5 % - 5.9(H) 6.2 6.0 6.1       Pulmonary Assessment Scores:   UCSD: Self-administered rating of dyspnea associated with activities of daily living (ADLs) 6-point scale (0 = "not at all" to 5 = "maximal or unable to do because of breathlessness")  Scoring Scores range from 0 to 120.  Minimally important difference is 5 units  CAT: CAT can identify the health impairment of COPD patients and is better correlated with disease progression.  CAT has a scoring range of zero to 40. The CAT score is classified into four groups of low (less than 10), medium (10 - 20), high (21-30) and very high (31-40) based on the impact level of disease on health status. A CAT score over 10 suggests significant symptoms.  A worsening CAT score could be explained by an exacerbation, poor medication adherence, poor inhaler technique, or progression of COPD or comorbid conditions.  CAT MCID is 2 points  mMRC: mMRC (Modified Medical Research Council) Dyspnea Scale is used to assess the degree of baseline functional disability in patients of respiratory disease due to dyspnea. No minimal important difference is established. A decrease in score of 1 point or greater is considered a positive change.   Pulmonary Function Assessment:   Exercise Target Goals: Exercise Program Goal: Individual exercise prescription set using results from initial 6 min walk test and THRR while considering  patient's activity barriers and safety.   Exercise Prescription Goal: Initial exercise prescription builds to 30-45 minutes a day of aerobic activity, 2-3 days per week.  Home exercise guidelines will be given to patient during program as part of exercise prescription that the  participant will acknowledge.  Education: Aerobic Exercise: - Group verbal and visual presentation on the components of exercise prescription. Introduces F.I.T.T principle from ACSM for exercise prescriptions.  Reviews F.I.T.T. principles of aerobic exercise including progression. Written material given at graduation.   Education: Resistance Exercise: - Group verbal and visual presentation on the components of exercise prescription.  Introduces F.I.T.T principle from ACSM for exercise prescriptions  Reviews F.I.T.T. principles of resistance exercise including progression. Written material given at graduation.    Education: Exercise & Equipment Safety: - Individual verbal instruction and demonstration of equipment use and safety with use of the equipment. Flowsheet Row Pulmonary Rehab from 08/10/2020 in [facility] Cardiac and Pulmonary Rehab  Date 08/10/20  Educator [facility]  Instruction Review Code 1- Verbalizes Understanding      Education: Exercise Physiology & General Exercise Guidelines: - Group verbal and written instruction with models to review the exercise physiology of the cardiovascular system and associated critical values. Provides general exercise guidelines with specific guidelines to those with heart or lung disease.    Education: Flexibility, Balance, Mind/Body Relaxation: - Group verbal and visual presentation with interactive activity on the components of exercise prescription. Introduces F.I.T.T principle from ACSM for exercise prescriptions. Reviews F.I.T.T. principles of flexibility and balance exercise training including progression. Also discusses the mind body connection.  Reviews various relaxation techniques to help reduce and manage stress (i.e. Deep breathing, progressive muscle relaxation, and visualization). Balance handout provided to take home. Written material given at graduation.   Activity Barriers & Risk Stratification:   6 Minute Walk:  Oxygen Initial  Assessment:   Oxygen Re-Evaluation:   Oxygen Discharge (Final Oxygen Re-Evaluation):   Initial Exercise Prescription:   Perform Capillary Blood Glucose checks as needed.  Exercise Prescription Changes:   Exercise Comments:   Exercise Goals and Review:   Exercise Goals Re-Evaluation :  Exercise Goals Re-Evaluation    Row Name 09/09/20 1145 10/07/20 1359           Exercise Goal Re-Evaluation   Comments Has yet to start rehab due to medical concerns Has yet to start rehab due to medical concerns             Discharge Exercise Prescription (Final Exercise Prescription Changes):   Nutrition:  Target Goals: Understanding of nutrition guidelines, daily intake of sodium 1500mg , cholesterol 200mg , calories 30% from fat and 7% or less from saturated fats, daily to have 5 or more servings of fruits and vegetables.  Education: All About Nutrition: -Group instruction provided by verbal, written material, interactive activities, discussions, models, and posters to present general guidelines for heart healthy nutrition including fat, fiber, MyPlate, the role of sodium in heart healthy nutrition, utilization of the nutrition label, and utilization of this knowledge for meal planning. Follow up email sent as well. Written material given at graduation.   Biometrics:    Nutrition Therapy Plan and Nutrition Goals:   Nutrition Assessments:  MEDIFICTS Score Key:  ?70 Need to make dietary changes   40-70 Heart Healthy Diet  ? 40 Therapeutic Level Cholesterol Diet   Picture Your Plate Scores:  <82 Unhealthy dietary pattern with much room for improvement.  41-50 Dietary pattern unlikely to meet recommendations for good health and room for improvement.  51-60 More healthful dietary pattern, with some room for improvement.   >60 Healthy dietary pattern, although there may be some specific behaviors that could be improved.   Nutrition Goals  Re-Evaluation:   Nutrition Goals Discharge (Final Nutrition Goals Re-Evaluation):   Psychosocial: Target Goals: Acknowledge presence or absence of significant depression and/or stress, maximize coping skills, provide positive support system. Participant is able to verbalize types and ability to use techniques and skills needed for reducing stress and depression.   Education: Stress, Anxiety, and Depression - Group verbal and visual presentation to define topics covered.  Reviews how body  is impacted by stress, anxiety, and depression.  Also discusses healthy ways to reduce stress and to treat/manage anxiety and depression.  Written material given at graduation.   Education: Sleep Hygiene -Provides group verbal and written instruction about how sleep can affect your health.  Define sleep hygiene, discuss sleep cycles and impact of sleep habits. Review good sleep hygiene tips.    Initial Review & Psychosocial Screening:   Quality of Life Scores:  Scores of 19 and below usually indicate a poorer quality of life in these areas.  A difference of  2-3 points is a clinically meaningful difference.  A difference of 2-3 points in the total score of the Quality of Life Index has been associated with significant improvement in overall quality of life, self-image, physical symptoms, and general health in studies assessing change in quality of life.  PHQ-9: Recent Review Flowsheet Data    Depression screen [facility] 2/9 08/17/2020 08/10/2020 02/28/2020 11/01/2019 08/08/2019   Decreased Interest 1 1 0 0 0   Down, Depressed, Hopeless 1 0 0 0 1   PHQ - 2 Score 2 1 0 0 1   Altered sleeping 2 - 0 - -   Tired, decreased energy 1 - 0 - -   Change in appetite 1 - 0 - -   Feeling bad or failure about yourself  1 - 0 - -   Trouble concentrating 0 - 0 - -   Moving slowly or fidgety/restless 0 - 0 - -   Suicidal thoughts 0 - 0 - -   PHQ-9 Score 7 - 0 - -   Difficult doing work/chores Not difficult at all - Not  difficult at all - -     Interpretation of Total Score  Total Score Depression Severity:  1-4 = Minimal depression, 5-9 = Mild depression, 10-14 = Moderate depression, 15-19 = Moderately severe depression, 20-27 = Severe depression   Psychosocial Evaluation and Intervention:   Psychosocial Re-Evaluation:   Psychosocial Discharge (Final Psychosocial Re-Evaluation):   Education: Education Goals: Education classes will be provided on a weekly basis, covering required topics. Participant will state understanding/return demonstration of topics presented.  Learning Barriers/Preferences:   General Pulmonary Education Topics:  Infection Prevention: - Provides verbal and written material to individual with discussion of infection control including proper hand washing and proper equipment cleaning during exercise session. Flowsheet Row Pulmonary Rehab from 08/10/2020 in [facility] Cardiac and Pulmonary Rehab  Date 08/10/20  Educator [facility]  Instruction Review Code 1- Verbalizes Understanding      Falls Prevention: - Provides verbal and written material to individual with discussion of falls prevention and safety. Flowsheet Row Pulmonary Rehab from 08/10/2020 in [facility] Cardiac and Pulmonary Rehab  Date 08/10/20  Educator [facility]  Instruction Review Code 1- Verbalizes Understanding      Chronic Lung Disease Review: - Group verbal instruction with posters, models, PowerPoint presentations and videos,  to review new updates, new respiratory medications, new advancements in procedures and treatments. Providing information on websites and "800" numbers for continued self-education. Includes information about supplement oxygen, available portable oxygen systems, continuous and intermittent flow rates, oxygen safety, concentrators, and Medicare reimbursement for oxygen. Explanation of Pulmonary Drugs, including class, frequency, complications, importance of spacers, rinsing mouth after steroid MDI's, and  proper cleaning methods for nebulizers. Review of basic lung anatomy and physiology related to function, structure, and complications of lung disease. Review of risk factors. Discussion about methods for diagnosing sleep apnea and types of masks and machines for OSA.  Includes a review of the use of types of environmental controls: home humidity, furnaces, filters, dust mite/pet prevention, HEPA vacuums. Discussion about weather changes, air quality and the benefits of nasal washing. Instruction on Warning signs, infection symptoms, calling MD promptly, preventive modes, and value of vaccinations. Review of effective airway clearance, coughing and/or vibration techniques. Emphasizing that all should Create an Action Plan. Written material given at graduation.   AED/CPR: - Group verbal and written instruction with the use of models to demonstrate the basic use of the AED with the basic ABC's of resuscitation.    Anatomy and Cardiac Procedures: - Group verbal and visual presentation and models provide information about basic cardiac anatomy and function. Reviews the testing methods done to diagnose heart disease and the outcomes of the test results. Describes the treatment choices: Medical Management, Angioplasty, or Coronary Bypass Surgery for treating various heart conditions including Myocardial Infarction, Angina, Valve Disease, and Cardiac Arrhythmias.  Written material given at graduation.   Medication Safety: - Group verbal and visual instruction to review commonly prescribed medications for heart and lung disease. Reviews the medication, class of the drug, and side effects. Includes the steps to properly store meds and maintain the prescription regimen.  Written material given at graduation.   Other: -Provides group and verbal instruction on various topics (see comments)   Knowledge Questionnaire Score:    Core Components/Risk Factors/Patient Goals at Admission:   Education:Diabetes -  Individual verbal and written instruction to review signs/symptoms of diabetes, desired ranges of glucose level fasting, after meals and with exercise. Acknowledge that pre and post exercise glucose checks will be done for 3 sessions at entry of program.   Know Your Numbers and Heart Failure: - Group verbal and visual instruction to discuss disease risk factors for cardiac and pulmonary disease and treatment options.  Reviews associated critical values for Overweight/Obesity, Hypertension, Cholesterol, and Diabetes.  Discusses basics of heart failure: signs/symptoms and treatments.  Introduces Heart Failure Zone chart for action plan for heart failure.  Written material given at graduation.   Core Components/Risk Factors/Patient Goals Review:    Core Components/Risk Factors/Patient Goals at Discharge (Final Review):    ITP Comments:  ITP Comments    Row Name 09/09/20 0621 09/09/20 1146 09/14/20 1240 09/22/20 1122 10/07/20 0728   ITP Comments 30 Day review completed. Medical Director ITP review done, changes made as directed, and signed approval by Medical Director. Continues to be out with medical concerns.  Vascular follow up on 2/24.  [place] requested cardiology look for lightheadedness Patient is getting a holter monitor to look for any irregular rhythms- patient prefers to hold on rehab until that is completed. Discussed possible discharge with new referral depending how long patient will need the monitor for. Patient will call us once appointment is set up. Spoke with patient regarding rehab. She is getting a holter monitor for 2 weeks- and wants to wait until she gets those results back until she starts here. We will hold patient on medical hold but informed patient her time slot will be opened. Patient understands and will call us when she gets her results back. Next steps will be determined on her results/doctor orders going forward 30 Day review completed. Medical Director ITP review done,  changes made as directed, and signed approval by Medical Director.  Out for medical reason   Row Name 10/07/20 1359 11/02/20 1836 11/02/20 1843 11/04/20 0944     ITP Comments Currently on medical hold.  Next cardiology visit is  4/5.  We will check in at that point. Discharging patient at this time from Pulmonary rehab when patient is ready and cleared to go back. She will get a new referral from her doctor. Discharge ITP sent and signed by Dr. Sabra Heck.  Discharge Summary routed to PCP and cardiologist. 30 Day review completed. Medical Director ITP review done, changes made as directed, and signed approval by Medical Director.           Comments:

## 2020-11-04 NOTE — Telephone Encounter (Addendum)
Attempted to reach pt via phone, LDM on VM (DPR approved) advised that the [place] [employer] have strict guidelines to accomodates disability parking passes and if medical professional forge reason for parking passes to be granted it can cause high fines for the medical provider.   It is recommended that pt falls in the class III or IV with CHF for cardiology to grant disability parking past, noted in history, pt is not known for CHF given her chronic sob. Advised to reach out to her PCP or her pulmonologist who follows her shob and lung issues to see if they are able to accommodate her with a parking pass.  Advised to call back with any further concerns or questions.

## 2020-11-04 NOTE — Telephone Encounter (Signed)
Patient still having sob and has an upcoming vascular procedure. Patient would like a handicapped placard

## 2020-11-06 ENCOUNTER — Other Ambulatory Visit: Payer: Self-pay

## 2020-11-06 ENCOUNTER — Ambulatory Visit (INDEPENDENT_AMBULATORY_CARE_PROVIDER_SITE_OTHER): Payer: PPO | Admitting: Internal Medicine

## 2020-11-06 ENCOUNTER — Encounter: Payer: Self-pay | Admitting: Internal Medicine

## 2020-11-06 ENCOUNTER — Telehealth: Payer: Self-pay | Admitting: Internal Medicine

## 2020-11-06 VITALS — BP 90/60 | HR 70 | Temp 97.8°F | Ht 66.0 in | Wt 189.2 lb

## 2020-11-06 DIAGNOSIS — E039 Hypothyroidism, unspecified: Secondary | ICD-10-CM

## 2020-11-06 DIAGNOSIS — R0602 Shortness of breath: Secondary | ICD-10-CM | POA: Diagnosis not present

## 2020-11-06 DIAGNOSIS — I959 Hypotension, unspecified: Secondary | ICD-10-CM | POA: Diagnosis not present

## 2020-11-06 DIAGNOSIS — J984 Other disorders of lung: Secondary | ICD-10-CM | POA: Diagnosis not present

## 2020-11-06 DIAGNOSIS — J9801 Acute bronchospasm: Secondary | ICD-10-CM | POA: Diagnosis not present

## 2020-11-06 DIAGNOSIS — R269 Unspecified abnormalities of gait and mobility: Secondary | ICD-10-CM | POA: Diagnosis not present

## 2020-11-06 DIAGNOSIS — J8410 Pulmonary fibrosis, unspecified: Secondary | ICD-10-CM | POA: Diagnosis not present

## 2020-11-06 DIAGNOSIS — E049 Nontoxic goiter, unspecified: Secondary | ICD-10-CM | POA: Diagnosis not present

## 2020-11-06 MED ORDER — OMEPRAZOLE 20 MG PO CPDR: 20.0000 mg | DELAYED_RELEASE_CAPSULE | Freq: Two times a day (BID) | ORAL | 11 refills | Status: DC

## 2020-11-06 MED ORDER — ALBUTEROL SULFATE HFA 108 (90 BASE) MCG/ACT IN AERS: 1.0000 | INHALATION_SPRAY | Freq: Four times a day (QID) | RESPIRATORY_TRACT | 11 refills | Status: DC | PRN

## 2020-11-06 MED ORDER — ATORVASTATIN CALCIUM 20 MG PO TABS: 20.0000 mg | ORAL_TABLET | Freq: Every day | ORAL | 3 refills | Status: DC

## 2020-11-06 MED ORDER — OLMESARTAN MEDOXOMIL 5 MG PO TABS: 5.0000 mg | ORAL_TABLET | Freq: Two times a day (BID) | ORAL | 3 refills | Status: DC

## 2020-11-06 MED ORDER — LEVOTHYROXINE SODIUM 75 MCG PO TABS: 75.0000 ug | ORAL_TABLET | Freq: Every day | ORAL | 3 refills | Status: DC

## 2020-11-06 MED ORDER — SERTRALINE HCL 25 MG PO TABS: 25.0000 mg | ORAL_TABLET | Freq: Every day | ORAL | 3 refills | Status: DC

## 2020-11-06 NOTE — Progress Notes (Signed)
Chief Complaint  Patient presents with  . Follow-up  . Form Completion    Handicap placard    F/u  1. HTN BP low normal on benicar 10 mg qd (1/2 of 20 mg)  at times she is lightheaded and shaky today walking with a cane  2. Laser surgery 11/20/20 and 12/18/20  3. C/w goiter and wants to reschedule thyroid US 4. Wants handicap placard filled out today and done   Review of Systems  Constitutional: Negative for weight loss.  HENT: Negative for hearing loss.   Eyes: Negative for blurred vision.  Respiratory: Negative for shortness of breath.   Cardiovascular: Negative for chest pain.  Musculoskeletal: Negative for falls and joint pain.  Skin: Negative for rash.  Neurological: Positive for dizziness.  Psychiatric/Behavioral: Negative for depression.   Past Medical History:  Diagnosis Date  . Acid reflux   . Allergy   . Anemia   . Anxiety    in past  . Arthritis    all over  . Asthma   . Atypical moles   . Bronchitis   . Bronchospasm    reactive with perfume/cologne and smoke  . CAD (coronary artery disease)    patient denies on preop of 10/24   . Candida esophagitis ([place])   . Carotid artery calcification, bilateral 04/2016  . Cataract   . Cervical stenosis of spine   . Chronic insomnia   . Complication of anesthesia    shoulder surgery- not all the way asleep  . Depression    in past  . Diverticulitis   . Diverticulosis   . Dizziness   . Elevated liver enzymes   . Family history of adverse reaction to anesthesia    sister- has problems with nausea and vomiting   . Fatigue   . Fatty liver   . Generalized headache    migraines  . GERD (gastroesophageal reflux disease)   . History of chemotherapy    topical cream for h/o skin CA  . History of kidney stones   . History of neuropathy    FH Dr. Posey Pronto neurology  . History of vertigo   . Hx of migraines   . Hyperlipidemia   . Hypothyroidism   . Insomnia   . Irritable bowel syndrome   . LLQ abdominal pain   .  Melanoma ([place]) 2013  . Melanoma ([place])    x2 stg II melanoma  . Neuropathy, peripheral   . Peripheral neuropathy   . Pneumonia    hx of x 2  . PONV (postoperative nausea and vomiting)   . Pre-diabetes   . Retina disorder    left  . SCC (squamous cell carcinoma)    skin   . Stroke [facility])    ? TIA per Dr V per patient   . Thyroid disease   . Urine incontinence   . Vitamin D deficiency   . Weight gain    Past Surgical History:  Procedure Laterality Date  . BILATERAL SALPINGOOPHORECTOMY     ovarian cyst Dr. Ouida Sills and Dr. Alisia Ferrari 2008  . BREAST SURGERY     in 2014 bx breast calcifications  . BREAST SURGERY     augmentation with saline Dr. Hubbard Hartshorn 1995   . CHOLECYSTECTOMY N/A 01/28/2015   Procedure: LAPAROSCOPIC CHOLECYSTECTOMY WITH INTRAOPERATIVE CHOLANGIOGRAM;  Surgeon: Autumn Messing III, MD;  Location: [facility];  Service: General;  Laterality: N/A;  . CLOSED MANIPULATION SHOULDER     rt  . COLONOSCOPY WITH PROPOFOL N/A  11/20/2012   Procedure: COLONOSCOPY WITH PROPOFOL;  Surgeon: Garlan Fair, MD;  Location: [facility];  Service: Endoscopy;  Laterality: N/A;  . colonscopy     May 2014  . JOINT REPLACEMENT     RTK  . KNEE ARTHROSCOPY    . left knee miniscus tear repair    . MELANOMA EXCISION WITH SENTINEL LYMPH NODE BIOPSY  03/18/11   Back, nodes neg  . OOPHORECTOMY    . ROTATOR CUFF REPAIR     left  . SHOULDER SURGERY     right and left   . SKIN CANCER EXCISION     multiple  . TOTAL HIP ARTHROPLASTY    . TOTAL KNEE ARTHROPLASTY     right 2010 and left in 2018   . TOTAL KNEE ARTHROPLASTY Left 05/16/2017   Procedure: LEFT TOTAL KNEE ARTHROPLASTY;  Surgeon: Paralee Cancel, MD;  Location: [facility];  Service: Orthopedics;  Laterality: Left;  70 mins  . TUBAL LIGATION    . TUMOR REMOVAL     ovary   Family History  Problem Relation Age of Onset  . Hypertension Mother   . Stroke Mother   . Hyperlipidemia Mother   . Heart disease Father   . Diabetes Father   .  Hypertension Father   . Hyperlipidemia Father   . Early death Father   . Diabetes Sister   . Heart disease Sister        heart murmur  . Arthritis Sister   . COPD Sister   . Hyperlipidemia Sister   . Hypertension Sister   . Pancreatic cancer Maternal Uncle   . Alzheimer's disease Paternal Aunt   . Colon cancer Maternal Grandfather   . Cancer Maternal Grandfather   . Early death Maternal Grandfather   . Alzheimer's disease Paternal Aunt   . Alzheimer's disease Paternal Aunt   . Colon polyps Sister        adenomous  . Arthritis Sister   . Heart disease Sister   . Hyperlipidemia Sister   . Hypertension Sister   . Cancer Sister   . Heart disease Daughter   . Hyperlipidemia Daughter   . Hypertension Daughter   . Heart disease Son   . Hyperlipidemia Son   . Alcohol abuse Son   . Depression Son   . Diabetes Son   . Stroke Maternal Grandmother   . Early death Paternal Grandmother   . Stroke Paternal Grandmother   . Cancer Other        2 paternal uncles and 1 maternal uncle colon cancer  . Alzheimer's disease Paternal Aunt    Social History   Socioeconomic History  . Marital status: Divorced    Spouse name: Not on file  . Number of children: 3  . Years of education: College  . Highest education level: Some college, no degree  Occupational History  . Occupation: Retired    Fish farm manager: RETIRED  Tobacco Use  . Smoking status: Never Smoker  . Smokeless tobacco: Never Used  Vaping Use  . Vaping Use: Never used  Substance and Sexual Activity  . Alcohol use: Yes    Alcohol/week: 1.0 standard drink    Types: 1 Cans of beer per week    Comment: once a week beer or wine  . Drug use: No  . Sexual activity: Not Currently  Other Topics Concern  . Not on file  Social History Narrative   Patient lives at home alone in a one story home.  Has 3 children (2 daughters and 1 son)    Retired from [employer]   Divorced since 1992    Caffeine Use: 1 cup daily and a  soft drink occasionally    No guns    Wears seat belt     Lives with cat    Left handed   Social Determinants of Health   Financial Resource Strain: Not on file  Food Insecurity: Not on file  Transportation Needs: Not on file  Physical Activity: Not on file  Stress: Not on file  Social Connections: Not on file  Intimate Partner Violence: Not on file   Current Meds  Medication Sig  . Ascorbic Acid (VITAMIN C) 1000 MG tablet Take 1,000 mg by mouth daily.  Marland Kitchen aspirin 81 MG tablet TAKE 1 TABLET BY MOUTH EVERY DAY  . Calcium 600-200 MG-UNIT tablet Take 1 tablet daily by mouth.  . Cholecalciferol (VITAMIN D3) 2000 UNITS capsule Take 2,000 Units by mouth daily.  Marland Kitchen FOLIC ACID PO Take by mouth daily.  . Magnesium 250 MG TABS Take 250 mg by mouth daily.  Marland Kitchen olmesartan (BENICAR) 5 MG tablet Take 1 tablet (5 mg total) by mouth in the morning and at bedtime.  . Omega-3 Fatty Acids (FISH OIL) 1000 MG CAPS Take 1,000 mg by mouth daily.   . vitamin B-12 (CYANOCOBALAMIN) 1000 MCG tablet Take 2,000 mcg by mouth daily. 2 tabs  . Zinc 50 MG CAPS Take 50 mg by mouth daily.  . [DISCONTINUED] albuterol (PROAIR HFA) 108 (90 Base) MCG/ACT inhaler Inhale 1-2 puffs into the lungs every 6 (six) hours as needed for wheezing or shortness of breath.  . [DISCONTINUED] atorvastatin (LIPITOR) 20 MG tablet Take 1 tablet (20 mg total) by mouth daily.  . [DISCONTINUED] levothyroxine (SYNTHROID) 75 MCG tablet Take 1 tablet (75 mcg total) by mouth daily before breakfast.  . [DISCONTINUED] olmesartan (BENICAR) 20 MG tablet Take 0.5 tablets (10 mg total) by mouth daily. If BP>130/>80 take another 1/2 pill  . [DISCONTINUED] omeprazole (PRILOSEC) 20 MG capsule Take 1 capsule (20 mg total) by mouth 2 (two) times daily before a meal.  . [DISCONTINUED] sertraline (ZOLOFT) 25 MG tablet TAKE 1 TABLET BY MOUTH EVERY DAY   Allergies  Allergen Reactions  . Epinephrine Other (See Comments) and Anaphylaxis    Heart palpitations  .  Penicillins Swelling and Other (See Comments)    Lip swelling Has patient had a PCN reaction causing immediate rash, facial/tongue/throat swelling, SOB or lightheadedness with hypotension: Yes Has patient had a PCN reaction causing severe rash involving mucus membranes or skin necrosis: No Has patient had a PCN reaction that required hospitalization No Has patient had a PCN reaction occurring within the last 10 years: No If all of the above answers are "NO", then may proceed with Cephalosporin use.  . Lidocaine Hcl Other (See Comments)  . Other     Black pepper- caused bronchial tubes to start closing   . Valium [Diazepam] Other (See Comments)    Other reaction(s): Other (See Comments) Overly sensitive  sedation   Recent Results (from the past 2160 hour(s))  Comprehensive metabolic panel     Status: Abnormal   Collection Time: 09/08/20  9:26 AM  Result Value Ref Range   Sodium 139 135 - 145 mEq/L   Potassium 4.6 3.5 - 5.1 mEq/L   Chloride 102 96 - 112 mEq/L   CO2 29 19 - 32 mEq/L   Glucose, Bld 129 (H) 70 - 99  mg/dL   BUN 24 (H) 6 - 23 mg/dL   Creatinine, Ser 1.02 0.40 - 1.20 mg/dL   Total Bilirubin 1.2 0.2 - 1.2 mg/dL   Alkaline Phosphatase 68 39 - 117 U/L   AST 13 0 - 37 U/L   ALT 11 0 - 35 U/L   Total Protein 6.5 6.0 - 8.3 g/dL   Albumin 4.3 3.5 - 5.2 g/dL   GFR 52.24 (L) >60.00 mL/min    Comment: Calculated using the CKD-EPI Creatinine Equation (2021)   Calcium 9.5 8.4 - 10.5 mg/dL  Lactic acid, plasma     Status: None   Collection Time: 09/08/20  9:26 AM  Result Value Ref Range   Lactate, Ven 13.9 4.8 - 25.7 mg/dL  CBC with Differential/Platelet     Status: None   Collection Time: 09/08/20  9:26 AM  Result Value Ref Range   WBC 5.8 4.0 - 10.5 K/uL   RBC 4.45 3.87 - 5.11 Mil/uL   Hemoglobin 13.2 12.0 - 15.0 g/dL   HCT 39.7 36.0 - 46.0 %   MCV 89.1 78.0 - 100.0 fl   MCHC 33.2 30.0 - 36.0 g/dL   RDW 13.7 11.5 - 15.5 %   Platelets 205.0 150.0 - 400.0 K/uL    Neutrophils Relative % 58.7 43.0 - 77.0 %   Lymphocytes Relative 29.9 12.0 - 46.0 %   Monocytes Relative 8.1 3.0 - 12.0 %   Eosinophils Relative 2.8 0.0 - 5.0 %   Basophils Relative 0.5 0.0 - 3.0 %   Neutro Abs 3.4 1.4 - 7.7 K/uL   Lymphs Abs 1.7 0.7 - 4.0 K/uL   Monocytes Absolute 0.5 0.1 - 1.0 K/uL   Eosinophils Absolute 0.2 0.0 - 0.7 K/uL   Basophils Absolute 0.0 0.0 - 0.1 K/uL  POCT Urinalysis Dipstick     Status: Abnormal   Collection Time: 09/10/20  2:40 PM  Result Value Ref Range   Color, UA yellow    Clarity, UA clear    Glucose, UA Negative Negative   Bilirubin, UA Neg    Ketones, UA Neg    Spec Grav, UA <=1.005 (A) 1.010 - 1.025   Blood, UA Neg    pH, UA 5.0 5.0 - 8.0   Protein, UA Negative Negative   Urobilinogen, UA 0.2 0.2 or 1.0 E.U./dL   Nitrite, UA Neg    Leukocytes, UA Negative Negative   Appearance     Odor    Urine Culture     Status: None   Collection Time: 09/10/20  3:01 PM   Specimen: Urine   Urine  Result Value Ref Range   Urine Culture, Routine Final report    Organism ID, Bacteria Comment     Comment: Mixed urogenital flora Less than 10,000 colonies/mL   POCT occult blood stool     Status: Normal   Collection Time: 09/10/20  4:00 PM  Result Value Ref Range   Fecal Occult Blood, POC Negative Negative   Card #1 Date     Card #2 Fecal Occult Blod, POC     Card #2 Date     Card #3 Fecal Occult Blood, POC     Card #3 Date    TSH     Status: None   Collection Time: 09/18/20  9:52 AM  Result Value Ref Range   TSH 1.540 0.350 - 4.500 uIU/mL    Comment: Performed by a 3rd Generation assay with a functional sensitivity of <=0.01 uIU/mL. Performed at [facility]  [facility], [address] [place], [place] [address]   Basic metabolic panel     Status: Abnormal   Collection Time: 09/18/20  9:52 AM  Result Value Ref Range   Sodium 141 135 - 145 mmol/L   Potassium 4.0 3.5 - 5.1 mmol/L   Chloride 107 98 - 111 mmol/L   CO2 24 22 - 32 mmol/L    Glucose, Bld 110 (H) 70 - 99 mg/dL    Comment: Glucose reference range applies only to samples taken after fasting for at least 8 hours.   BUN 19 8 - 23 mg/dL   Creatinine, Ser 0.81 0.44 - 1.00 mg/dL   Calcium 9.2 8.9 - 10.3 mg/dL   GFR, Estimated >60 >60 mL/min    Comment: (NOTE) Calculated using the CKD-EPI Creatinine Equation (2021)    Anion gap 10 5 - 15    Comment: Performed at [facility], [address] [place], [place] [address]   Objective  Body mass index is 30.54 kg/m. Wt Readings from Last 3 Encounters:  11/06/20 189 lb 3.2 oz (85.8 kg)  10/23/20 190 lb 6 oz (86.4 kg)  10/06/20 189 lb 6.4 oz (85.9 kg)   Temp Readings from Last 3 Encounters:  11/06/20 97.8 F (36.6 C) (Oral)  09/10/20 97.9 F (36.6 C)  08/21/20 98.5 F (36.9 C)   BP Readings from Last 3 Encounters:  11/06/20 90/60  10/23/20 120/70  10/06/20 130/74   Pulse Readings from Last 3 Encounters:  11/06/20 70  10/23/20 62  10/06/20 62    Physical Exam Vitals and nursing note reviewed.  Constitutional:      Appearance: Normal appearance. She is well-developed. She is obese.  HENT:     Head: Normocephalic and atraumatic.  Cardiovascular:     Rate and Rhythm: Normal rate and regular rhythm.     Heart sounds: Normal heart sounds. No murmur heard.   Pulmonary:     Effort: Pulmonary effort is normal.     Breath sounds: Normal breath sounds.  Skin:    General: Skin is warm and dry.  Neurological:     General: No focal deficit present.     Mental Status: She is alert and oriented to person, place, and time. Mental status is at baseline.     Gait: Gait abnormal.     Comments: Walking with cane today declines rollator Rx   Psychiatric:        Attention and Perception: Attention and perception normal.        Mood and Affect: Mood and affect normal.        Speech: Speech normal.        Behavior: Behavior normal. Behavior is cooperative.        Thought Content: Thought content  normal.        Cognition and Memory: Cognition and memory normal.        Judgment: Judgment normal.     Assessment  Plan  Abnormal gait Rx handicap placard  Today   Goiter - Plan: US THYROID Hypothyroidism, unspecified type - Plan: levothyroxine (SYNTHROID) 75 MCG tablet TSH normal 1.540 09/18/20   Pulmonary fibrosis ([place]) - Plan: albuterol (PROAIR HFA) 108 (90 Base) MCG/ACT inhaler Scarring of lung - Plan: albuterol (PROAIR HFA) 108 (90 Base) MCG/ACT inhaler SOB (shortness of breath) on exertion - Plan: albuterol (PROAIR HFA) 108 (90 Base) MCG/ACT inhaler  Bronchospasm - Plan: albuterol (PROAIR HFA) 108 (90 Base) MCG/ACT inhaler Refilled all meds [facility] [address] [facility]  of [facility]   Hypotension Reduce benicar 10 mg qd 1/2 of 20 mg to 5 mg bid  Cc cards  Reviewed BP log today with low normal results scanned into chart and fax to cards   HM Flu shotutd Tdap-had TD 10/18/12 not pertussis will need this in future shingrixwill need if has not had zostervax had 06/16/11 prevnarhad 05/15/14 covid3/3vaccines utdpfizerconsider booster  Pna 23had 12/16/05, 05/18/16  Pap out of age window LMP [place] with implants7/6/21normal -10/18/16 screening with implants neg FH breast cancer p cousins dx'ed in 72s x 36, sister Had at age 1 y.o and colon cancer age 32, 2 paternal cousins ovarian cancer age 16 and 68  01/21/20 negativeordered [facility] for 01/20/21   Colonoscopy 11/2012 tortuous colon no further rec. -she is established and f/u GI Dr. Havery Moros [place] virtual coloscopyhad 02/25/20 -leb 02/25/20 CT colonoscopy-leb GI IMPRESSION: 1. Suboptimal evaluation of the sigmoid, secondary to underdistention. No circumferential mass within this region. More proximally, no evidence of clinically significant colonic polyp or mass. 2. Small hiatal hernia. 3. Aortic Atherosclerosis (ICD10-I70.0).   DEXA [facility] PCP 06/28/16 normal reviewed report did  not scan into chart -h/o vitamin D def on D3 2000 IU daily  [facility]ermat[facility]" [facility] Dr. Delman Cheadle mohs surgery F/u from 04/10/19 left lower leg cellulitis for SCC changed to levaquin rec compression socks and acidic acid soaks qhs   05/15/19 f/u appt s/p SCC mohs removal f/u in 3 months Dr. Winifred Olive  Echo 01/07/20 FINDINGS  Left Ventricle: Left ventricular ejection fraction, by estimation, is 60  to 65%. The left ventricle has normal function. The left ventricle has no  regional wall motion abnormalities. The left ventricular internal cavity  size was normal in size. There is  borderline left ventricular hypertrophy. Left ventricular diastolic  parameters are consistent with age-related delayed relaxation (normal).   Right Ventricle: The right ventricular size is normal. No increase in  right ventricular wall thickness. Right ventricular systolic function is  normal. There is normal pulmonary artery systolic pressure. The tricuspid  regurgitant velocity is 2.46 m/s, and  with an assumed right atrial pressure of 3 mmHg, the estimated right  ventricular systolic pressure is 29.9 mmHg.   Left Atrium: Left atrial size was normal in size.   Right Atrium: Right atrial size was normal in size.   Pericardium: There is no evidence of pericardial effusion.   Mitral Valve: The mitral valve is degenerative in appearance. There is  mild thickening of the mitral valve leaflet(s). Mild mitral valve  regurgitation. No evidence of mitral valve stenosis. MV peak gradient, 3.7  mmHg. The mean mitral valve gradient is  1.0 mmHg.   Tricuspid Valve: The tricuspid valve is normal in structure. Tricuspid  valve regurgitation is mild to moderate.   Aortic Valve: The aortic valve is tricuspid. Aortic valve regurgitation is  not visualized. No aortic stenosis is present. Aortic valve mean gradient  measures 4.0 mmHg. Aortic valve peak gradient measures 9.1 mmHg. Aortic   valve area, by VTI measures 2.19  cm.   Pulmonic Valve: The pulmonic valve was not well visualized. Pulmonic valve  regurgitation is mild.   Aorta: The aortic root is normal in size and structure.   Pulmonary Artery: The pulmonary artery is not well seen.   Venous: The inferior vena cava is normal in size with greater than 50%  respiratory variability, suggesting right atrial pressure of 3 mmHg.   IAS/Shunts: The interatrial  septum was not well visualized.   Specialists  [facility] saw 01/01/19 increased nocturia and urgency pending Botox to tx and UDS Dr. Junious Silk and try myrbetriq before UDS  Cards-Dr. Rockey Situ leb GI Leb GIDr. Moss Mc MD Dr. Cordelia Pen [facility]-retinal specialist  Dentist Dr. Lynelle Smoke  Neurology-[facility] Dr. Posey Pronto  ENT-Dr. Tami Ribas for vestibular balance issue s Podiatry Dr. Milinda Pointer  H/o Dr. Earlie Server [facility] Lung-Dr. Silvana Newness Dr. Alvan Dame  Surgery Dr.Toth  Dermatology Dr. Delman Cheadle in [place] h/o [facility] left leg and MM right leg,neck and backwith node dissection Dr. Marlou Starks 8/12  Former PCP Dr. Jossie Ng  -pt brought in all records week of 09/21/2018 and reviewed  -h/o iron def anemia  -last seen 07/09/18 and 08/09/18 possible thrush and URI  US carotid 05/13/16 <50% stenosis in right and left ICA -she was at this time put on lipitor 10 mg qhs   CT ab/pelvis 07/26/17 left base scarring lung, well circumcised liver lesion likely cysts and stable hepatomegaly 19.4 cm cranlocaudal pancreas/spleen normal, extensive colonic diverticulosis, aortic and branch vessell atherosclerosis no adenopathy, normal uterus and adnexa, advanced LS spondylosis grade 1 L4/5 and L5/S1 anterolisthesis are similar, pelvic floor laxity, hiatal hernia  CXR 02/26/18 thoracic aorta mildly atherosclerotic unchanged, Deg changes T spine, Deg changes b/l shoulders prior right shoulder surgery calcification distal left supraspinatus tendon at its insertion on the great tuberosity of the left  humerus    Provider: Dr. Olivia Mackie McLean-Scocuzza-Internal Medicine

## 2020-11-06 NOTE — Patient Instructions (Addendum)
Consider 4th covid dose  mammo due 01/20/21 [name] call to schedule

## 2020-11-06 NOTE — Telephone Encounter (Signed)
lft vm for pt to call ofc to sch US thanks 

## 2020-11-09 ENCOUNTER — Telehealth (INDEPENDENT_AMBULATORY_CARE_PROVIDER_SITE_OTHER): Payer: Self-pay

## 2020-11-09 ENCOUNTER — Telehealth: Payer: Self-pay | Admitting: Internal Medicine

## 2020-11-09 NOTE — Telephone Encounter (Signed)
Patient called in forgot the number to referral and name

## 2020-11-09 NOTE — Telephone Encounter (Signed)
Patient called about a prescription being written for compression hose. I advised that the prescription will be written an put at the front desk for her to pick up today.

## 2020-11-13 ENCOUNTER — Telehealth: Payer: Self-pay | Admitting: Cardiovascular Disease

## 2020-11-13 NOTE — Telephone Encounter (Signed)
Patients PCP faxed over patient's BP readings  Placed in nurse box

## 2020-11-13 NOTE — Telephone Encounter (Signed)
Per Dr. McLean-Scocuzza's office note from 11/06/20: Hypotension Reduce benicar 10 mg qd 1/2 of 20 mg to 5 mg bid  Cc cards  Reviewed BP log today with low normal results scanned into chart and fax to cards   BP readings received from nurse bin:  BP (HR): 4/1- 116/79 (60) & 98/67 4/2- 117/76 (56) 4/3- 108/79 (66) & 93/60 4/4- 109/90 (64) & 99/68 4/5- 115/80 (65) 4/6- 136/79 (61) & 95/63 4/7- 108/69 (60) 4/8- saw Dr. Rockey Situ 4/9- 127/73 (66) & 99/62 4/10- 121/77 (61) & 95/72 4/11- 112/77 (80) 4/12- 117/74 (669) & 99/69 4/13- no entry 4/14- 107/65 (68) & 94/66 4/16- 136/76 (79) & 93/72 4/17- 108/64 (64) & 93/63 4/18- 125/72 (64) & 96/93 4/19- 111/64 (67), 113/66 (69) & 97/72 4/20- 129/70 (60) & 97/62 4/21- 118/76 (61) & 93/67 4/22- 144/74 (69) & 98/67  To Dr. Rockey Situ to review.

## 2020-11-17 DIAGNOSIS — I83813 Varicose veins of bilateral lower extremities with pain: Secondary | ICD-10-CM | POA: Diagnosis not present

## 2020-11-18 ENCOUNTER — Telehealth (INDEPENDENT_AMBULATORY_CARE_PROVIDER_SITE_OTHER): Payer: Self-pay

## 2020-11-18 ENCOUNTER — Encounter: Payer: PPO | Admitting: Gastroenterology

## 2020-11-18 NOTE — Telephone Encounter (Signed)
Error

## 2020-11-18 NOTE — Telephone Encounter (Signed)
Pt called and left a VM on the nurses line wanting to know  About the status of her lymph pump and and did she need to stop her  Asprin for her laser this Friday I called I called the pt and made her aware that she didn't have to stop her Asprin and I gave her the number to [employer] and made her aware that once we write the Rx the conversation is then between the company and the pt. The pt later called back and left a VM on  The nurses line  and made me aware that [employer] made her aware that they had faxed over the pt's order for her pumps and the only thing they needed was a Dr's Signature on the Letter of medical necessity. I called the pt once finding the paper work and  Made her aware that I will get the Signature and have the paper work faxed .

## 2020-11-19 ENCOUNTER — Telehealth (INDEPENDENT_AMBULATORY_CARE_PROVIDER_SITE_OTHER): Payer: Self-pay | Admitting: Vascular Surgery

## 2020-11-20 ENCOUNTER — Other Ambulatory Visit (INDEPENDENT_AMBULATORY_CARE_PROVIDER_SITE_OTHER): Payer: PPO | Admitting: Vascular Surgery

## 2020-11-23 ENCOUNTER — Ambulatory Visit
Admission: RE | Admit: 2020-11-23 | Discharge: 2020-11-23 | Disposition: A | Discharge status: Home / Self Care | Payer: PPO | Source: Ambulatory Visit | Attending: Internal Medicine | Admitting: Internal Medicine

## 2020-11-23 ENCOUNTER — Other Ambulatory Visit: Payer: Self-pay

## 2020-11-23 DIAGNOSIS — E049 Nontoxic goiter, unspecified: Secondary | ICD-10-CM | POA: Insufficient documentation

## 2020-11-27 ENCOUNTER — Encounter (INDEPENDENT_AMBULATORY_CARE_PROVIDER_SITE_OTHER): Payer: PPO

## 2020-12-02 ENCOUNTER — Telehealth: Payer: Self-pay | Admitting: Internal Medicine

## 2020-12-03 ENCOUNTER — Telehealth (INDEPENDENT_AMBULATORY_CARE_PROVIDER_SITE_OTHER): Payer: Self-pay

## 2020-12-03 NOTE — Telephone Encounter (Signed)
I receive a call from Kern Alberta at [facility] regarding the patient calling daily upset that she doesn't have her lymphedema pump. Catalina Antigua has attempted to explain to her on multiple occasions that this is due to her insurance needing to authorize this. Patient does not seem to understand that in order to receive the pump her insurance needs to authorize and send the authorization to [place]. Matt stated he has contacted his office to try and push this through along with contacting her insurance for the authorization.

## 2020-12-05 DIAGNOSIS — I890 Lymphedema, not elsewhere classified: Secondary | ICD-10-CM | POA: Diagnosis not present

## 2020-12-08 ENCOUNTER — Other Ambulatory Visit (INDEPENDENT_AMBULATORY_CARE_PROVIDER_SITE_OTHER): Payer: PPO | Admitting: Vascular Surgery

## 2020-12-10 DIAGNOSIS — Z85828 Personal history of other malignant neoplasm of skin: Secondary | ICD-10-CM | POA: Diagnosis not present

## 2020-12-10 DIAGNOSIS — L821 Other seborrheic keratosis: Secondary | ICD-10-CM | POA: Diagnosis not present

## 2020-12-10 DIAGNOSIS — D225 Melanocytic nevi of trunk: Secondary | ICD-10-CM | POA: Diagnosis not present

## 2020-12-10 DIAGNOSIS — L578 Other skin changes due to chronic exposure to nonionizing radiation: Secondary | ICD-10-CM | POA: Diagnosis not present

## 2020-12-10 DIAGNOSIS — Z87898 Personal history of other specified conditions: Secondary | ICD-10-CM | POA: Diagnosis not present

## 2020-12-10 DIAGNOSIS — Z85820 Personal history of malignant melanoma of skin: Secondary | ICD-10-CM | POA: Diagnosis not present

## 2020-12-10 DIAGNOSIS — L570 Actinic keratosis: Secondary | ICD-10-CM | POA: Diagnosis not present

## 2020-12-10 DIAGNOSIS — Z808 Family history of malignant neoplasm of other organs or systems: Secondary | ICD-10-CM | POA: Diagnosis not present

## 2020-12-15 ENCOUNTER — Other Ambulatory Visit: Payer: Self-pay

## 2020-12-15 ENCOUNTER — Encounter (INDEPENDENT_AMBULATORY_CARE_PROVIDER_SITE_OTHER): Payer: Self-pay | Admitting: Vascular Surgery

## 2020-12-15 ENCOUNTER — Ambulatory Visit (INDEPENDENT_AMBULATORY_CARE_PROVIDER_SITE_OTHER): Payer: PPO | Admitting: Vascular Surgery

## 2020-12-15 ENCOUNTER — Encounter (INDEPENDENT_AMBULATORY_CARE_PROVIDER_SITE_OTHER): Payer: PPO

## 2020-12-15 VITALS — BP 99/62 | HR 66 | Resp 16 | Wt 188.0 lb

## 2020-12-15 DIAGNOSIS — I83813 Varicose veins of bilateral lower extremities with pain: Secondary | ICD-10-CM | POA: Diagnosis not present

## 2020-12-15 NOTE — Progress Notes (Signed)
[name] is a 80 y.o. female who presents with symptomatic venous reflux  Past Medical History:  Diagnosis Date  . Acid reflux   . Allergy   . Anemia   . Anxiety    in past  . Arthritis    all over  . Asthma   . Atypical moles   . Bronchitis   . Bronchospasm    reactive with perfume/cologne and smoke  . CAD (coronary artery disease)    patient denies on preop of 10/24   . Candida esophagitis ([place])   . Carotid artery calcification, bilateral 04/2016  . Cataract   . Cervical stenosis of spine   . Chronic insomnia   . Complication of anesthesia    shoulder surgery- not all the way asleep  . Depression    in past  . Diverticulitis   . Diverticulosis   . Dizziness   . Elevated liver enzymes   . Family history of adverse reaction to anesthesia    sister- has problems with nausea and vomiting   . Fatigue   . Fatty liver   . Generalized headache    migraines  . GERD (gastroesophageal reflux disease)   . History of chemotherapy    topical cream for h/o skin CA  . History of kidney stones   . History of neuropathy    FH Dr. Posey Pronto neurology  . History of vertigo   . Hx of migraines   . Hyperlipidemia   . Hypothyroidism   . Insomnia   . Irritable bowel syndrome   . LLQ abdominal pain   . Melanoma ([place]) 2013  . Melanoma ([place])    x2 stg II melanoma  . Neuropathy, peripheral   . Peripheral neuropathy   . Pneumonia    hx of x 2  . PONV (postoperative nausea and vomiting)   . Pre-diabetes   . Retina disorder    left  . SCC (squamous cell carcinoma)    skin   . Stroke [facility])    ? TIA per Dr V per patient   . Thyroid disease   . Urine incontinence   . Vitamin D deficiency   . Weight gain     Past Surgical History:  Procedure Laterality Date  . BILATERAL SALPINGOOPHORECTOMY     ovarian cyst Dr. Ouida Sills and Dr. Alisia Ferrari 2008  . BREAST SURGERY     in 2014 bx breast calcifications  . BREAST SURGERY     augmentation with saline Dr. Hubbard Hartshorn 1995   .  CHOLECYSTECTOMY N/A 01/28/2015   Procedure: LAPAROSCOPIC CHOLECYSTECTOMY WITH INTRAOPERATIVE CHOLANGIOGRAM;  Surgeon: Autumn Messing III, MD;  Location: [facility];  Service: General;  Laterality: N/A;  . CLOSED MANIPULATION SHOULDER     rt  . COLONOSCOPY WITH PROPOFOL N/A 11/20/2012   Procedure: COLONOSCOPY WITH PROPOFOL;  Surgeon: Garlan Fair, MD;  [facility];  Service: Endoscopy;  Laterality: N/A;  . colonscopy     May 2014  . JOINT REPLACEMENT     RTK  . KNEE ARTHROSCOPY    . left knee miniscus tear repair    . MELANOMA EXCISION WITH SENTINEL LYMPH NODE BIOPSY  03/18/11   Back, nodes neg  . OOPHORECTOMY    . ROTATOR CUFF REPAIR     left  . SHOULDER SURGERY     right and left   . SKIN CANCER EXCISION     multiple  . TOTAL HIP ARTHROPLASTY    . TOTAL KNEE ARTHROPLASTY  right 2010 and left in 2018   . TOTAL KNEE ARTHROPLASTY Left 05/16/2017   Procedure: LEFT TOTAL KNEE ARTHROPLASTY;  Surgeon: Paralee Cancel, MD;  [facility];  Service: Orthopedics;  Laterality: Left;  70 mins  . TUBAL LIGATION    . TUMOR REMOVAL     ovary     Current Outpatient Medications:  .  albuterol (PROAIR HFA) 108 (90 Base) MCG/ACT inhaler, Inhale 1-2 puffs into the lungs every 6 (six) hours as needed for wheezing or shortness of breath., Disp: 18 g, Rfl: 11 .  Ascorbic Acid (VITAMIN C) 1000 MG tablet, Take 1,000 mg by mouth daily., Disp: , Rfl:  .  aspirin 81 MG tablet, TAKE 1 TABLET BY MOUTH EVERY DAY, Disp: , Rfl:  .  atorvastatin (LIPITOR) 20 MG tablet, Take 1 tablet (20 mg total) by mouth daily., Disp: 90 tablet, Rfl: 3 .  Calcium 600-200 MG-UNIT tablet, Take 1 tablet daily by mouth., Disp: , Rfl:  .  Cholecalciferol (VITAMIN D3) 2000 UNITS capsule, Take 2,000 Units by mouth daily., Disp: , Rfl:  .  FOLIC ACID PO, Take by mouth daily., Disp: , Rfl:  .  levothyroxine (SYNTHROID) 75 MCG tablet, Take 1 tablet (75 mcg total) by mouth daily before breakfast., Disp: 90 tablet, Rfl: 3 .   Magnesium 250 MG TABS, Take 250 mg by mouth daily., Disp: , Rfl:  .  olmesartan (BENICAR) 5 MG tablet, Take 1 tablet (5 mg total) by mouth in the morning and at bedtime., Disp: 180 tablet, Rfl: 3 .  Omega-3 Fatty Acids (FISH OIL) 1000 MG CAPS, Take 1,000 mg by mouth daily. , Disp: , Rfl:  .  omeprazole (PRILOSEC) 20 MG capsule, Take 1 capsule (20 mg total) by mouth 2 (two) times daily before a meal., Disp: 60 capsule, Rfl: 11 .  sertraline (ZOLOFT) 25 MG tablet, Take 1 tablet (25 mg total) by mouth daily., Disp: 90 tablet, Rfl: 3 .  vitamin B-12 (CYANOCOBALAMIN) 1000 MCG tablet, Take 2,000 mcg by mouth daily. 2 tabs, Disp: , Rfl:  .  Zinc 50 MG CAPS, Take 50 mg by mouth daily., Disp: , Rfl:   Allergies  Allergen Reactions  . Epinephrine Other (See Comments) and Anaphylaxis    Heart palpitations  . Penicillins Swelling and Other (See Comments)    Lip swelling Has patient had a PCN reaction causing immediate rash, facial/tongue/throat swelling, SOB or lightheadedness with hypotension: Yes Has patient had a PCN reaction causing severe rash involving mucus membranes or skin necrosis: No Has patient had a PCN reaction that required hospitalization No Has patient had a PCN reaction occurring within the last 10 years: No If all of the above answers are "NO", then may proceed with Cephalosporin use.  . Lidocaine Hcl Other (See Comments)  . Other     Black pepper- caused bronchial tubes to start closing   . Valium [Diazepam] Other (See Comments)    Other reaction(s): Other (See Comments) Overly sensitive  sedation     Varicose veins of leg with pain, bilateral     PLAN: The patient's left lower extremity was sterilely prepped and draped. The ultrasound machine was used to visualize the saphenous vein throughout its course. A segment in the mid calf was selected for access. The saphenous vein was accessed without difficulty using ultrasound guidance with a micropuncture needle. A 0.018 wire  was then placed beyond the saphenofemoral junction and the needle was removed. The 65 cm sheath was then placed over  the wire and the wire and dilator were removed. The laser fiber was then placed through the sheath and its tip was placed approximately 4-5 centimeters below the saphenofemoral junction. Tumescent anesthesia was then created with a dilute lidocaine solution. Laser energy was then delivered with constant withdrawal of the sheath and laser fiber. Approximately 1778 joules of energy were delivered over a length of 42 centimeters using a 1470 Hz VenaCure machine at 7 W. Sterile dressings were placed. The patient tolerated the procedure well without obvious complications.   Follow-up in 1 week with post-laser duplex.

## 2020-12-16 ENCOUNTER — Telehealth (INDEPENDENT_AMBULATORY_CARE_PROVIDER_SITE_OTHER): Payer: Self-pay

## 2020-12-16 NOTE — Telephone Encounter (Signed)
I would recommend waiting at least a 1-2 weeks.  Then I would use them as they are tolerable to her

## 2020-12-16 NOTE — Telephone Encounter (Signed)
The pt left a VM on the nurse line wanting to know when can she wear she compression pumps? The pt had a Laser procedure yesterday and is about to remove the bandages we applied in the office she would like to know  Can she wear the compression stockings while using the pumps if she is allowed to start using the pumps as well. Please advise.

## 2020-12-17 NOTE — Telephone Encounter (Signed)
Can the pt wear the compression socks with the pump?

## 2020-12-18 ENCOUNTER — Telehealth: Payer: Self-pay | Admitting: Internal Medicine

## 2020-12-18 ENCOUNTER — Other Ambulatory Visit (INDEPENDENT_AMBULATORY_CARE_PROVIDER_SITE_OTHER): Payer: Self-pay | Admitting: Vascular Surgery

## 2020-12-18 ENCOUNTER — Other Ambulatory Visit (INDEPENDENT_AMBULATORY_CARE_PROVIDER_SITE_OTHER): Payer: PPO | Admitting: Vascular Surgery

## 2020-12-18 DIAGNOSIS — I83813 Varicose veins of bilateral lower extremities with pain: Secondary | ICD-10-CM

## 2020-12-18 NOTE — Telephone Encounter (Signed)
Yes she can.

## 2020-12-18 NOTE — Telephone Encounter (Signed)
Fyi  Pt is supposed to be on benicar 5 mg bid was doing qd clarified bottle was wrong from pharmacy and should be 2x per day  Will try 2x per day 5 mg benicar and call back next week with BP readings

## 2020-12-18 NOTE — Telephone Encounter (Signed)
Patient called and stated her bp was 193/89, she took a another bp pill and it went down to 152/72. Patient was transferred to [facility] at Access Nurse

## 2020-12-18 NOTE — Telephone Encounter (Signed)
Does she have amlodipine 2.5 daily?  If so take this add this back with benicar  Monitor BP and let me know what it is doing Tuesday please

## 2020-12-18 NOTE — Telephone Encounter (Signed)
I called the pt and left a VM with the Np's instructions.

## 2020-12-18 NOTE — Telephone Encounter (Signed)
Patient stated she does not have the amlodipine anymore. Her cardiologist took her off that medication.she took two doses of olmasartan Last BP was 132/70. Current BP is 138/69, Per PCP patient can take two pills of olmasartan. If patient needs further tablets patient was informed to let us know so we can place new rx.

## 2020-12-20 ENCOUNTER — Emergency Department
Admission: EM | Admit: 2020-12-20 | Discharge: 2020-12-20 | Disposition: A | Discharge status: Home / Self Care | Payer: PPO | Attending: Emergency Medicine | Admitting: Emergency Medicine

## 2020-12-20 ENCOUNTER — Emergency Department: Payer: PPO

## 2020-12-20 ENCOUNTER — Encounter: Payer: Self-pay | Admitting: Emergency Medicine

## 2020-12-20 ENCOUNTER — Other Ambulatory Visit: Payer: Self-pay

## 2020-12-20 DIAGNOSIS — I119 Hypertensive heart disease without heart failure: Secondary | ICD-10-CM | POA: Insufficient documentation

## 2020-12-20 DIAGNOSIS — Z96653 Presence of artificial knee joint, bilateral: Secondary | ICD-10-CM | POA: Diagnosis not present

## 2020-12-20 DIAGNOSIS — J84112 Idiopathic pulmonary fibrosis: Secondary | ICD-10-CM | POA: Diagnosis not present

## 2020-12-20 DIAGNOSIS — Z7982 Long term (current) use of aspirin: Secondary | ICD-10-CM | POA: Insufficient documentation

## 2020-12-20 DIAGNOSIS — I2510 Atherosclerotic heart disease of native coronary artery without angina pectoris: Secondary | ICD-10-CM | POA: Diagnosis not present

## 2020-12-20 DIAGNOSIS — E039 Hypothyroidism, unspecified: Secondary | ICD-10-CM | POA: Diagnosis not present

## 2020-12-20 DIAGNOSIS — Z79899 Other long term (current) drug therapy: Secondary | ICD-10-CM | POA: Diagnosis not present

## 2020-12-20 DIAGNOSIS — D649 Anemia, unspecified: Secondary | ICD-10-CM | POA: Diagnosis not present

## 2020-12-20 DIAGNOSIS — Z96649 Presence of unspecified artificial hip joint: Secondary | ICD-10-CM | POA: Diagnosis not present

## 2020-12-20 DIAGNOSIS — R079 Chest pain, unspecified: Secondary | ICD-10-CM | POA: Diagnosis not present

## 2020-12-20 DIAGNOSIS — R0789 Other chest pain: Secondary | ICD-10-CM | POA: Diagnosis not present

## 2020-12-20 DIAGNOSIS — I10 Essential (primary) hypertension: Secondary | ICD-10-CM

## 2020-12-20 DIAGNOSIS — J45909 Unspecified asthma, uncomplicated: Secondary | ICD-10-CM | POA: Diagnosis not present

## 2020-12-20 DIAGNOSIS — Z85828 Personal history of other malignant neoplasm of skin: Secondary | ICD-10-CM | POA: Diagnosis not present

## 2020-12-20 DIAGNOSIS — K219 Gastro-esophageal reflux disease without esophagitis: Secondary | ICD-10-CM | POA: Diagnosis not present

## 2020-12-20 LAB — CBC
HCT: 40.7 % (ref 36.0–46.0)
Hemoglobin: 13.4 g/dL (ref 12.0–15.0)
MCH: 29.6 pg (ref 26.0–34.0)
MCHC: 32.9 g/dL (ref 30.0–36.0)
MCV: 90 fL (ref 80.0–100.0)
Platelets: 196 10*3/uL (ref 150–400)
RBC: 4.52 MIL/uL (ref 3.87–5.11)
RDW: 13.2 % (ref 11.5–15.5)
WBC: 5.3 10*3/uL (ref 4.0–10.5)
nRBC: 0 % (ref 0.0–0.2)

## 2020-12-20 LAB — TROPONIN I (HIGH SENSITIVITY): Troponin I (High Sensitivity): 3 ng/L (ref <18)

## 2020-12-20 LAB — BASIC METABOLIC PANEL
Anion gap: 11 (ref 5–15)
BUN: 18 mg/dL (ref 8–23)
CO2: 22 mmol/L (ref 22–32)
Calcium: 9.4 mg/dL (ref 8.9–10.3)
Chloride: 107 mmol/L (ref 98–111)
Creatinine, Ser: 0.87 mg/dL (ref 0.44–1.00)
GFR, Estimated: >60 mL/min (ref >60)
Glucose, Bld: 128 mg/dL — ABNORMAL HIGH (ref 70–99)
Potassium: 4.7 mmol/L (ref 3.5–5.1)
Sodium: 140 mmol/L (ref 135–145)

## 2020-12-20 NOTE — ED Provider Notes (Signed)
[facility] Emergency Department Provider Note ____________________________________________  Time seen: 1130  I have reviewed the triage vital signs and the nursing notes.  HISTORY  Chief Complaint  Hypertension and Chest Pain   HPI [name] is a 80 y.o. female with history of hypertension, hyperlipidemia with CAD status post CVA presents to the ER today with complaint of elevated blood pressures.  She reports she has been taking her blood pressures at home and has been running in the 160s over 90s.  She reports she normally takes her blood pressure medicine twice a day but it was recently called in for a one times a day.  She called her primary care's office who instructed her to take it twice daily as previously prescribed.  She has been taking her medicine as prescribed for the last 3 days.  She reports she has lightheadedness but this is chronic.  She denies frequent headaches, visual changes, nausea or vomiting.  She reports she has intermittent chest pressure that does not last long with no associated symptoms however this is not uncommon for her.  She also has chronic shortness of breath due to IPF.  She does not smoke.  Her BP today is 128/60.  Past Medical History:  Diagnosis Date  . Acid reflux   . Allergy   . Anemia   . Anxiety    in past  . Arthritis    all over  . Asthma   . Atypical moles   . Bronchitis   . Bronchospasm    reactive with perfume/cologne and smoke  . CAD (coronary artery disease)    patient denies on preop of 10/24   . Candida esophagitis ([place])   . Carotid artery calcification, bilateral 04/2016  . Cataract   . Cervical stenosis of spine   . Chronic insomnia   . Complication of anesthesia    shoulder surgery- not all the way asleep  . Depression    in past  . Diverticulitis   . Diverticulosis   . Dizziness   . Elevated liver enzymes   . Family history of adverse reaction to anesthesia    sister- has problems with  nausea and vomiting   . Fatigue   . Fatty liver   . Generalized headache    migraines  . GERD (gastroesophageal reflux disease)   . History of chemotherapy    topical cream for h/o skin CA  . History of kidney stones   . History of neuropathy    FH Dr. Posey Pronto neurology  . History of vertigo   . Hx of migraines   . Hyperlipidemia   . Hypothyroidism   . Insomnia   . Irritable bowel syndrome   . LLQ abdominal pain   . Melanoma ([place]) 2013  . Melanoma ([place])    x2 stg II melanoma  . Neuropathy, peripheral   . Peripheral neuropathy   . Pneumonia    hx of x 2  . PONV (postoperative nausea and vomiting)   . Pre-diabetes   . Retina disorder    left  . SCC (squamous cell carcinoma)    skin   . Stroke [facility])    ? TIA per Dr V per patient   . Thyroid disease   . Urine incontinence   . Vitamin D deficiency   . Weight gain     Patient Active Problem List   Diagnosis Date Noted  . Varicose veins of leg with pain, bilateral 10/06/2020  . Cerebrovascular accident (  CVA) ([place]) 09/22/2020  . Mid back pain 09/22/2020  . Lymphedema 08/21/2020  . CAD (coronary artery disease) 07/20/2020  . Abnormal CT of the chest 05/07/2020  . Dyspnea on exertion 05/07/2020  . Dysplastic nevus 02/28/2020  . Diverticulosis 02/28/2020  . Obesity (BMI 30-39.9) 02/28/2020  . SCCA (squamous cell carcinoma) of skin 01/03/2020  . Dry eyes 12/05/2019  . Arthritis of hand 12/03/2019  . Carpal tunnel syndrome of left wrist 12/03/2019  . Scarring of lung 11/01/2019  . Liver cyst 11/01/2019  . Essential hypertension 11/01/2019  . Carotid artery stenosis 08/13/2019  . Cervical spinal stenosis 08/13/2019  . Skin cancer 07/17/2019  . Leg wound, left 05/14/2019  . Overactive bladder 03/08/2019  . Chronic cough 02/19/2019  . Irritable bowel syndrome with diarrhea 02/19/2019  . Depression, recurrent ([place]) 02/19/2019  . Lumbosacral spondylosis without myelopathy 12/06/2018  . Flushing 10/27/2018  . Aortic  atherosclerosis ([place]) 10/10/2018  . Chest tightness 10/10/2018  . Idiopathic neuropathy 09/13/2018  . History of stroke 09/13/2018  . History of squamous cell carcinoma of skin 09/13/2018  . History of depression 09/13/2018  . History of migraine 09/13/2018  . Fatty liver 09/13/2018  . GERD (gastroesophageal reflux disease) 09/13/2018  . History of memory loss 09/13/2018  . Prediabetes 09/13/2018  . Hiatal hernia 09/13/2018  . Retinal drusen of both eyes 12/14/2017  . Asthma in remission 05/19/2017  . Primary osteoarthritis of left knee 05/19/2017  . Female stress incontinence 05/08/2017  . Pain in pelvis 05/08/2017  . Coronary artery calcification seen on CAT scan 04/09/2017  . Carotid stenosis, asymptomatic, bilateral 04/09/2017  . Hyperlipidemia 04/09/2017  . MCI (mild cognitive impairment) 12/17/2015  . Urinary frequency 08/09/2015  . Hypothyroidism 08/09/2015  . Syncope 08/08/2015  . Posterior vitreous detachment of right eye 07/23/2015  . Hereditary and idiopathic peripheral neuropathy 05/11/2015  . Epiretinal membrane, left 04/12/2012  . Nuclear cataract 04/12/2012  . Age-related nuclear cataract, bilateral 04/12/2012  . Breast pain in female 07/18/2011  . Melanoma of back ([place]) 02/28/2011    Past Surgical History:  Procedure Laterality Date  . BILATERAL SALPINGOOPHORECTOMY     ovarian cyst Dr. Ouida Sills and Dr. Alisia Ferrari 2008  . BREAST SURGERY     in 2014 bx breast calcifications  . BREAST SURGERY     augmentation with saline Dr. Hubbard Hartshorn 1995   . CHOLECYSTECTOMY N/A 01/28/2015   Procedure: LAPAROSCOPIC CHOLECYSTECTOMY WITH INTRAOPERATIVE CHOLANGIOGRAM;  Surgeon: Autumn Messing III, MD;  Location: [place];  Service: General;  Laterality: N/A;  . CLOSED MANIPULATION SHOULDER     rt  . COLONOSCOPY WITH PROPOFOL N/A 11/20/2012   Procedure: COLONOSCOPY WITH PROPOFOL;  Surgeon: Garlan Fair, MD;  [facility];  Service: Endoscopy;  Laterality: N/A;  .  colonscopy     May 2014  . JOINT REPLACEMENT     RTK  . KNEE ARTHROSCOPY    . left knee miniscus tear repair    . MELANOMA EXCISION WITH SENTINEL LYMPH NODE BIOPSY  03/18/11   Back, nodes neg  . OOPHORECTOMY    . ROTATOR CUFF REPAIR     left  . SHOULDER SURGERY     right and left   . SKIN CANCER EXCISION     multiple  . TOTAL HIP ARTHROPLASTY    . TOTAL KNEE ARTHROPLASTY     right 2010 and left in 2018   . TOTAL KNEE ARTHROPLASTY Left 05/16/2017   Procedure: LEFT TOTAL KNEE ARTHROPLASTY;  Surgeon: Paralee Cancel,  MD;  [facility];  Service: Orthopedics;  Laterality: Left;  70 mins  . TUBAL LIGATION    . TUMOR REMOVAL     ovary    Prior to Admission medications   Medication Sig Start Date End Date Taking? Authorizing Provider  albuterol (PROAIR HFA) 108 (90 Base) MCG/ACT inhaler Inhale 1-2 puffs into the lungs every 6 (six) hours as needed for wheezing or shortness of breath. 11/06/20   McLean-Scocuzza, Nino Glow, MD  Ascorbic Acid (VITAMIN C) 1000 MG tablet Take 1,000 mg by mouth daily.    [provider]  aspirin 81 MG tablet TAKE 1 TABLET BY MOUTH EVERY DAY 04/23/18   [provider]  atorvastatin (LIPITOR) 20 MG tablet Take 1 tablet (20 mg total) by mouth daily. 11/06/20   McLean-Scocuzza, Nino Glow, MD  Calcium 600-200 MG-UNIT tablet Take 1 tablet daily by mouth.    [provider]  Cholecalciferol (VITAMIN D3) 2000 UNITS capsule Take 2,000 Units by mouth daily.    [provider]  FOLIC ACID PO Take by mouth daily.    [provider]  levothyroxine (SYNTHROID) 75 MCG tablet Take 1 tablet (75 mcg total) by mouth daily before breakfast. 11/06/20   McLean-Scocuzza, Nino Glow, MD  Magnesium 250 MG TABS Take 250 mg by mouth daily.    [provider]  olmesartan (BENICAR) 5 MG tablet Take 1 tablet (5 mg total) by mouth in the morning and at bedtime. 11/06/20   McLean-Scocuzza, Nino Glow, MD  Omega-3 Fatty Acids (FISH OIL) 1000 MG CAPS  Take 1,000 mg by mouth daily.     [provider]  omeprazole (PRILOSEC) 20 MG capsule Take 1 capsule (20 mg total) by mouth 2 (two) times daily before a meal. 11/06/20 03/06/21  McLean-Scocuzza, Nino Glow, MD  sertraline (ZOLOFT) 25 MG tablet Take 1 tablet (25 mg total) by mouth daily. 11/06/20   McLean-Scocuzza, Nino Glow, MD  vitamin B-12 (CYANOCOBALAMIN) 1000 MCG tablet Take 2,000 mcg by mouth daily. 2 tabs    [provider]  Zinc 50 MG CAPS Take 50 mg by mouth daily.    [provider]    Allergies Epinephrine, Penicillins, Lidocaine hcl, Other, and Valium [diazepam]  Family History  Problem Relation Age of Onset  . Hypertension Mother   . Stroke Mother   . Hyperlipidemia Mother   . Heart disease Father   . Diabetes Father   . Hypertension Father   . Hyperlipidemia Father   . Early death Father   . Diabetes Sister   . Heart disease Sister        heart murmur  . Arthritis Sister   . COPD Sister   . Hyperlipidemia Sister   . Hypertension Sister   . Pancreatic cancer Maternal Uncle   . Alzheimer's disease Paternal Aunt   . Colon cancer Maternal Grandfather   . Cancer Maternal Grandfather   . Early death Maternal Grandfather   . Alzheimer's disease Paternal Aunt   . Alzheimer's disease Paternal Aunt   . Colon polyps Sister        adenomous  . Arthritis Sister   . Heart disease Sister   . Hyperlipidemia Sister   . Hypertension Sister   . Cancer Sister   . Heart disease Daughter   . Hyperlipidemia Daughter   . Hypertension Daughter   . Heart disease Son   . Hyperlipidemia Son   . Alcohol abuse Son   . Depression Son   . Diabetes  Son   . Stroke Maternal Grandmother   . Early death Paternal Grandmother   . Stroke Paternal Grandmother   . Cancer Other        2 paternal uncles and 1 maternal uncle colon cancer  . Alzheimer's disease Paternal Aunt     Social History Social History   Tobacco Use  . Smoking status: Never Smoker  . Smokeless  tobacco: Never Used  Vaping Use  . Vaping Use: Never used  Substance Use Topics  . Alcohol use: Yes    Alcohol/week: 1.0 standard drink    Types: 1 Cans of beer per week    Comment: once a week beer or wine  . Drug use: No    Review of Systems  Constitutional: Negative for fever, chills or body aches. Eyes: Negative for visual changes. Cardiovascular: Positive for intermittent chest pressure, none now.  Negative for chest pain or chest tightness. Respiratory: Positive for shortness of breath.  Negative for cough. Neurological: Negative for headaches, focal weakness, tingling or numbness. ____________________________________________  PHYSICAL EXAM:  VITAL SIGNS: ED Triage Vitals  Enc Vitals Group     BP 12/20/20 1009 126/80     Pulse Rate 12/20/20 1009 63     Resp 12/20/20 1009 16     Temp 12/20/20 1009 98.3 F (36.8 C)     Temp Source 12/20/20 1009 Oral     SpO2 12/20/20 1009 96 %     Weight 12/20/20 1010 185 lb (83.9 kg)     Height 12/20/20 1010 5\' 6"  (1.676 m)     Head Circumference --      Peak Flow --      Pain Score 12/20/20 1010 0     Pain Loc --      Pain Edu? --      Excl. in [place]? --     Constitutional: Alert and oriented. Well appearing and in no distress. Head: Normocephalic and atraumatic. Eyes:  Normal extraocular movements Hematological/Lymphatic/Immunological: No cervical lymphadenopathy. Cardiovascular: Bradycardic, regular rhythm. Normal distal pulses. Respiratory: Normal respiratory effort. No wheezes/rales/rhonchi noted. Musculoskeletal: Gait slow and steady with use of cane. Neurologic:  Normal speech and language. No gross focal neurologic deficits are appreciated. Skin:  Skin is warm, dry and intact. No rash noted. Psychiatric: Mood and affect are normal. Patient exhibits appropriate insight and judgment. ____________________________________________   LABS Labs Reviewed  BASIC METABOLIC PANEL - Abnormal; Notable for the following components:       Result Value   Glucose, Bld 128 (*)    All other components within normal limits  CBC  TROPONIN I (HIGH SENSITIVITY)    ____________________________________________  EKG  ED ECG REPORT   Date: 12/20/2020  EKG Time: 12:53 PM  Rate: 61  Rhythm: NSR  Axis: normal  Intervals:no conduction defects  ST&T Change: no ST change  Narrative Interpretation: NSR, unchanged from prior    ____________________________________________   RADIOLOGY  Imaging Orders     DG Chest 2 View IMPRESSION: Hyperinflated lungs without acute infiltrate.  Aortic Atherosclerosis (ICD10-I70.0).   ____________________________________________   INITIAL IMPRESSION / ASSESSMENT AND PLAN / ED COURSE  HTN, Intermittent Chest Pressure:  DDx include HTN, CAD with Angina, ACS, IPF ECG unchanged Chest xray negative Blood pressures have been < 133/64 in the ER She is not currently having any chest pain or pressure at this time No further intervention needed at this time, pt agreeable to discharge with plan to continue current medications Follow up with PCP, pulmonology and  cardiology at this time ____________________________________________  FINAL CLINICAL IMPRESSION(S) / ED DIAGNOSES  Final diagnoses:  Primary hypertension  Coronary artery disease involving native coronary artery of native heart without angina pectoris  IPF (idiopathic pulmonary fibrosis) ([place])      Jearld Fenton, NP 12/20/20 1253    Arta Silence, MD 12/20/20 1331

## 2020-12-20 NOTE — ED Notes (Signed)
Pt BP at home this morning read 160/100. She took her BP meds at 0800 this morning ([place]). Pt currently has no other symptoms.

## 2020-12-20 NOTE — Discharge Instructions (Addendum)
You were seen today for elevated blood pressures at home.  Your blood pressure has been normal in the ER.  Your labs are unremarkable.  Your ECG was unchanged.  Your chest x-ray is consistent with pulmonary fibrosis.  I believe your high blood pressures at home are related to having a regular adult cuff instead of a large adult cuff.  Please try to purchase this from the pharmacy or medical supply store.  Please continue all current medications and follow-up with your PCP, pulmonologist and cardiologist as previously scheduled.

## 2020-12-20 NOTE — ED Notes (Signed)
First Nurse Note: Pt to ED via POV c/o problems with her blood pressure and intermittent chest pain. Pt is not currently have chest pain.

## 2020-12-20 NOTE — ED Triage Notes (Signed)
Pt to ED via POV stating that for the past few weeks she has been having issues with her blood pressure. Pt states that her PCP had her prescription wrong so she was only taking her BP meds once a day instead of BID. Pt states that she started taking it twice a day 2 days ago but it has not helped. Pt states that she has not missed any doses of her medication. Pt also states that she has had some intermittent discomfort in her chest. Pt is not currently having chest pain. Pt is in NAD.

## 2020-12-21 ENCOUNTER — Telehealth: Payer: Self-pay | Admitting: Internal Medicine

## 2020-12-21 ENCOUNTER — Telehealth: Payer: Self-pay

## 2020-12-21 NOTE — Telephone Encounter (Signed)
Patient called in again on 12/19/20 at 12pm c/o high blood pressure. Was instructed to see PCP within 2 weeks.

## 2020-12-21 NOTE — Telephone Encounter (Signed)
Noted  

## 2020-12-21 NOTE — Telephone Encounter (Signed)
See previous telephone encounters. Matter addressed

## 2020-12-21 NOTE — Telephone Encounter (Signed)
She should be taking benicar 5 mg 2x per day for goal BP <130/<80  If BP is rising we can change pills to 10 mg to take 1/2 pill 2x perday 5 mg 2x per day and if needed 10 mg for BP >130/>80  Does she want to do this?

## 2020-12-21 NOTE — Telephone Encounter (Signed)
No this is ok  She can get new cuff Bryson Corona

## 2020-12-21 NOTE — Telephone Encounter (Signed)
Patient called in on Saturday 12/19/20 and informed access nurse of High BP due to incorrect dosage of medication. Patient was informed to see PCP within 24 hours. Patient was seen in the ED on 12/20/2020 for Primary Hypertension.

## 2020-12-21 NOTE — Telephone Encounter (Signed)
FYI

## 2020-12-21 NOTE — Telephone Encounter (Signed)
Patient was seen by ED yesterday and was informed of the below:    You were seen today for elevated blood pressures at home. Your blood pressure has been normal in the ER. Your labs are unremarkable. Your ECG was unchanged. Your chest x-ray is consistent with pulmonary fibrosis. I believe your high blood pressures at home are related to having a regular adult cuff instead of a large adult cuff. Please try to purchase this from the pharmacy or medical supply store. Please continue all current medications and follow-up with your PCP, pulmonologist and cardiologist as previously scheduled.  Blood pressure was normal in ED and Patient was informed her cuff was too small. Would you still like to increase medication?

## 2020-12-21 NOTE — Telephone Encounter (Signed)
PT called in to schedule a appt to see Wayne for BP but [facility] had nothing till Wednesday. She was okay with seeing Laverna Peace for tomorrow. She also states that she be bringing in her BP machines to make sure they work.

## 2020-12-21 NOTE — Telephone Encounter (Signed)
Patient was seen by ED yesterday and was informed of the below:   You were seen today for elevated blood pressures at home. Your blood pressure has been normal in the ER. Your labs are unremarkable. Your ECG was unchanged. Your chest x-ray is consistent with pulmonary fibrosis. I believe your high blood pressures at home are related to having a regular adult cuff instead of a large adult cuff. Please try to purchase this from the pharmacy or medical supply store. Please continue all current medications and follow-up with your PCP, pulmonologist and cardiologist as previously scheduled.

## 2020-12-21 NOTE — Telephone Encounter (Signed)
Noted, Patient was informed of this via [facility] per last message

## 2020-12-22 ENCOUNTER — Ambulatory Visit (INDEPENDENT_AMBULATORY_CARE_PROVIDER_SITE_OTHER): Payer: PPO

## 2020-12-22 ENCOUNTER — Other Ambulatory Visit: Payer: Self-pay

## 2020-12-22 ENCOUNTER — Encounter: Payer: Self-pay | Admitting: Adult Health

## 2020-12-22 ENCOUNTER — Ambulatory Visit (INDEPENDENT_AMBULATORY_CARE_PROVIDER_SITE_OTHER): Payer: PPO | Admitting: Adult Health

## 2020-12-22 VITALS — BP 118/50 | HR 74 | Temp 98.2°F | Ht 65.98 in | Wt 189.6 lb

## 2020-12-22 DIAGNOSIS — I10 Essential (primary) hypertension: Secondary | ICD-10-CM

## 2020-12-22 DIAGNOSIS — I83813 Varicose veins of bilateral lower extremities with pain: Secondary | ICD-10-CM

## 2020-12-22 DIAGNOSIS — E039 Hypothyroidism, unspecified: Secondary | ICD-10-CM

## 2020-12-22 MED ORDER — OLMESARTAN MEDOXOMIL 5 MG PO TABS: 5.0000 mg | ORAL_TABLET | Freq: Two times a day (BID) | ORAL | 0 refills | Status: DC

## 2020-12-22 NOTE — Telephone Encounter (Signed)
Noted, canceled during No show/ charge period.

## 2020-12-22 NOTE — Telephone Encounter (Signed)
Pt has decided to come in.

## 2020-12-22 NOTE — Patient Instructions (Signed)
Meds ordered this encounter  Medications  . olmesartan (BENICAR) 5 MG tablet    Sig: Take 1 tablet (5 mg total) by mouth in the morning and at bedtime.    Dispense:  180 tablet    Refill:  0   Discontinue the 20 mg tablet you are breaking for 10 mg twice daily,  that you are taking currently this is not what  McLean-Scocuzza, Nino Glow, MD Ordered, she wants you to take the above dose of Benicar.  Wait on thyroid level .   Olmesartan Tablets What is this medicine? OLMESARTAN (all mi SAR tan) is an angiotensin II receptor blocker, also known as an ARB. It treats high blood pressure. This medicine may be used for other purposes; ask your health care provider or pharmacist if you have questions. COMMON BRAND NAME(S): Benicar What should I tell my health care provider before I take this medicine? They need to know if you have any of these conditions:  if you are on a special diet, such as a low-salt diet  kidney or liver disease  an unusual or allergic reaction to olmesartan, other medicines, foods, dyes, or preservatives  pregnant or trying to get pregnant  breast-feeding How should I use this medicine? Take this drug by mouth. Take it as directed on the prescription label at the same time every day. You can take it with or without food. If it upsets your stomach, take it with food. Keep taking it unless your health care provider tells you to stop. Talk to your health care provider about the use of this drug in children. While it may be prescribed for children as young as 6 for selected conditions, precautions do apply. Overdosage: If you think you have taken too much of this medicine contact a poison control center or emergency room at once. NOTE: This medicine is only for you. Do not share this medicine with others. What if I miss a dose? If you miss a dose, take it as soon as you can. If it is almost time for your next dose, take only that dose. Do not take double or extra  doses. What may interact with this medicine?  blood pressure medicines  diuretics, especially triamterene, spironolactone or amiloride  potassium salts or potassium supplements This list may not describe all possible interactions. Give your health care provider a list of all the medicines, herbs, non-prescription drugs, or dietary supplements you use. Also tell them if you smoke, drink alcohol, or use illegal drugs. Some items may interact with your medicine. What should I watch for while using this medicine? Visit your doctor or health care professional for regular checks on your progress. Check your blood pressure as directed. Ask your doctor or health care professional what your blood pressure should be and when you should contact him or her. Call your doctor or health care professional if you notice an irregular or fast heart beat. Women should inform their doctor if they wish to become pregnant or think they might be pregnant. There is a potential for serious side effects to an unborn child, particularly in the second or third trimester. Talk to your health care professional or pharmacist for more information. You may get drowsy or dizzy. Do not drive, use machinery, or do anything that needs mental alertness until you know how this drug affects you. Do not stand or sit up quickly, especially if you are an older patient. This reduces the risk of dizzy or fainting spells. Alcohol can  make you more drowsy and dizzy. Avoid alcoholic drinks. Avoid salt substitutes unless you are told otherwise by your doctor or health care professional. Do not treat yourself for coughs, colds, or pain while you are taking this medicine without asking your doctor or health care professional for advice. Some ingredients may increase your blood pressure. What side effects may I notice from receiving this medicine? Side effects that you should report to your doctor or health care professional as soon as  possible:  confusion, dizziness, light headedness or fainting spells  decreased amount of urine passed  diarrhea  difficulty breathing or swallowing, hoarseness, or tightening of the throat  fast or irregular heart beat, palpitations, or chest pain  skin rash, itching  swelling of your face, lips, tongue, hands, or feet  vomiting  weight loss Side effects that usually do not require medical attention (report to your doctor or health care professional if they continue or are bothersome):  cough  decreased sexual function or desire  headache  nasal congestion or stuffiness  nausea  sore or cramping muscles This list may not describe all possible side effects. Call your doctor for medical advice about side effects. You may report side effects to FDA at 1-800-FDA-1088. Where should I keep my medicine? Keep out of the reach of children and pets. Store at room temperature between 20 and 25 degrees C (68 and 77 degrees F). Throw away any unused drug after the expiration date. NOTE: This sheet is a summary. It may not cover all possible information. If you have questions about this medicine, talk to your doctor, pharmacist, or health care provider.  2021 [employer] (2019-02-06 13:23:55)

## 2020-12-22 NOTE — Telephone Encounter (Signed)
PT called to cancel the appt she had schedule. PT states if she is going to be charge a fee for canceling to let her know. Did advise PT she was canceling within in the 24 hours.

## 2020-12-22 NOTE — Progress Notes (Signed)
Acute Office Visit  Subjective:    Patient ID: [name], female    DOB: [birth year], 80 y.o.   MRN: [id number]  Chief Complaint  Patient presents with  . Hypertension    HPI Patient is in today for follow up for hypertension.  She has also been taking the Benicar 1/2 tablet in the morning and night of the Benicar 20 mg tablet that Dr. Olivia Mackie discontinued on 11/06/20.olmesartan (BENICAR) 5 MG tablet [[id number]]   Order Details Dose: 5 mg Route: Oral Frequency: 2 times daily  Dispense Quantity: 180 tablet Refills: 3   Note to Pharmacy: D/c benicar 20 1/2 pill qd      Sig: Take 1 tablet (5 mg total) by mouth in the morning and at bedtime.      Start Date: 11/06/20 End Date: --  Written Date: 11/06/20 Expiration Date: 11/06/21   Providers  Authorizing Provider:   McLean-Scocuzza, Nino Glow, MD  [address] [place] [address]  Phone:  [phone]  Fax:  [phone]  DEA #:  [id number]  NPI:  [id number]      In the Emergency room she was 126/80, she was 200/100 she reports with her machine at home.  She has hyperthyroid medication she has been taking 50 mcg some days  and also 75 mcg on some other days she reports.  She was getting 124/68 after her ER visit. CBC, CMP and Troponin were all within normal limits. She does not endorse any chest pain in the office today she did in the emergency room report that she has been having intermittent chest pressure chronically that last a few seconds and has no other associated symptoms. EKG was reviewed by ER physician and documented to be no change NSR rate 61 without ST changes.   She does endorse lightheaded that is chronic. Patient  denies any fever, body aches,chills, rash, chest pain, shortness of breath, nausea, vomiting, or diarrhea.  Denies any syncope.   Past Medical History:  Diagnosis Date  . Acid reflux   . Allergy   . Anemia   . Anxiety    in past  . Arthritis    all over  . Asthma   . Atypical moles    . Bronchitis   . Bronchospasm    reactive with perfume/cologne and smoke  . CAD (coronary artery disease)    patient denies on preop of 10/24   . Candida esophagitis ([place])   . Carotid artery calcification, bilateral 04/2016  . Cataract   . Cervical stenosis of spine   . Chronic insomnia   . Complication of anesthesia    shoulder surgery- not all the way asleep  . Depression    in past  . Diverticulitis   . Diverticulosis   . Dizziness   . Elevated liver enzymes   . Family history of adverse reaction to anesthesia    sister- has problems with nausea and vomiting   . Fatigue   . Fatty liver   . Generalized headache    migraines  . GERD (gastroesophageal reflux disease)   . History of chemotherapy    topical cream for h/o skin CA  . History of kidney stones   . History of neuropathy    FH Dr. Posey Pronto neurology  . History of vertigo   . Hx of migraines   . Hyperlipidemia   . Hypothyroidism   . Insomnia   . Irritable bowel syndrome   . LLQ abdominal pain   . Melanoma (  [place]) 2013  . Melanoma ([place])    x2 stg II melanoma  . Neuropathy, peripheral   . Peripheral neuropathy   . Pneumonia    hx of x 2  . PONV (postoperative nausea and vomiting)   . Pre-diabetes   . Retina disorder    left  . SCC (squamous cell carcinoma)    skin   . Stroke [facility])    ? TIA per Dr V per patient   . Thyroid disease   . Urine incontinence   . Vitamin D deficiency   . Weight gain     Past Surgical History:  Procedure Laterality Date  . BILATERAL SALPINGOOPHORECTOMY     ovarian cyst Dr. Ouida Sills and Dr. Alisia Ferrari 2008  . BREAST SURGERY     in 2014 bx breast calcifications  . BREAST SURGERY     augmentation with saline Dr. Hubbard Hartshorn 1995   . CHOLECYSTECTOMY N/A 01/28/2015   Procedure: LAPAROSCOPIC CHOLECYSTECTOMY WITH INTRAOPERATIVE CHOLANGIOGRAM;  Surgeon: Autumn Messing III, MD;  Location: [facility];  Service: General;  Laterality: N/A;  . CLOSED MANIPULATION SHOULDER     rt  .  COLONOSCOPY WITH PROPOFOL N/A 11/20/2012   Procedure: COLONOSCOPY WITH PROPOFOL;  Surgeon: Garlan Fair, MD;  Location: [facility];  Service: Endoscopy;  Laterality: N/A;  . colonscopy     May 2014  . JOINT REPLACEMENT     RTK  . KNEE ARTHROSCOPY    . left knee miniscus tear repair    . MELANOMA EXCISION WITH SENTINEL LYMPH NODE BIOPSY  03/18/11   Back, nodes neg  . OOPHORECTOMY    . ROTATOR CUFF REPAIR     left  . SHOULDER SURGERY     right and left   . SKIN CANCER EXCISION     multiple  . TOTAL HIP ARTHROPLASTY    . TOTAL KNEE ARTHROPLASTY     right 2010 and left in 2018   . TOTAL KNEE ARTHROPLASTY Left 05/16/2017   Procedure: LEFT TOTAL KNEE ARTHROPLASTY;  Surgeon: Paralee Cancel, MD;  Location: [facility];  Service: Orthopedics;  Laterality: Left;  70 mins  . TUBAL LIGATION    . TUMOR REMOVAL     ovary    Family History  Problem Relation Age of Onset  . Hypertension Mother   . Stroke Mother   . Hyperlipidemia Mother   . Heart disease Father   . Diabetes Father   . Hypertension Father   . Hyperlipidemia Father   . Early death Father   . Diabetes Sister   . Heart disease Sister        heart murmur  . Arthritis Sister   . COPD Sister   . Hyperlipidemia Sister   . Hypertension Sister   . Pancreatic cancer Maternal Uncle   . Alzheimer's disease Paternal Aunt   . Colon cancer Maternal Grandfather   . Cancer Maternal Grandfather   . Early death Maternal Grandfather   . Alzheimer's disease Paternal Aunt   . Alzheimer's disease Paternal Aunt   . Colon polyps Sister        adenomous  . Arthritis Sister   . Heart disease Sister   . Hyperlipidemia Sister   . Hypertension Sister   . Cancer Sister   . Heart disease Daughter   . Hyperlipidemia Daughter   . Hypertension Daughter   . Heart disease Son   . Hyperlipidemia Son   . Alcohol abuse Son   . Depression Son   . Diabetes  Son   . Stroke Maternal Grandmother   . Early death Paternal Grandmother   . Stroke  Paternal Grandmother   . Cancer Other        2 paternal uncles and 1 maternal uncle colon cancer  . Alzheimer's disease Paternal Aunt     Social History   Socioeconomic History  . Marital status: Divorced    Spouse name: Not on file  . Number of children: 3  . Years of education: College  . Highest education level: Some college, no degree  Occupational History  . Occupation: Retired    Fish farm manager: RETIRED  Tobacco Use  . Smoking status: Never Smoker  . Smokeless tobacco: Never Used  Vaping Use  . Vaping Use: Never used  Substance and Sexual Activity  . Alcohol use: Yes    Alcohol/week: 1.0 standard drink    Types: 1 Cans of beer per week    Comment: once a week beer or wine  . Drug use: No  . Sexual activity: Not Currently  Other Topics Concern  . Not on file  Social History Narrative   Patient lives at home alone in a one story home.   Has 3 children (2 daughters and 1 son)    Retired from [employer]   Divorced since 1992    Caffeine Use: 1 cup daily and a soft drink occasionally    No guns    Wears seat belt     Lives with cat    Left handed   Social Determinants of Health   Financial Resource Strain: Not on file  Food Insecurity: Not on file  Transportation Needs: Not on file  Physical Activity: Not on file  Stress: Not on file  Social Connections: Not on file  Intimate Partner Violence: Not on file    Outpatient Medications Prior to Visit  Medication Sig Dispense Refill  . albuterol (PROAIR HFA) 108 (90 Base) MCG/ACT inhaler Inhale 1-2 puffs into the lungs every 6 (six) hours as needed for wheezing or shortness of breath. 18 g 11  . Ascorbic Acid (VITAMIN C) 1000 MG tablet Take 1,000 mg by mouth daily.    Marland Kitchen aspirin 81 MG tablet TAKE 1 TABLET BY MOUTH EVERY DAY    . atorvastatin (LIPITOR) 20 MG tablet Take 1 tablet (20 mg total) by mouth daily. 90 tablet 3  . Calcium 600-200 MG-UNIT tablet Take 1 tablet daily by mouth.    . Cholecalciferol  (VITAMIN D3) 2000 UNITS capsule Take 2,000 Units by mouth daily.    Marland Kitchen FOLIC ACID PO Take by mouth daily.    Marland Kitchen levothyroxine (SYNTHROID) 75 MCG tablet Take 1 tablet (75 mcg total) by mouth daily before breakfast. 90 tablet 3  . Magnesium 250 MG TABS Take 250 mg by mouth daily.    . Omega-3 Fatty Acids (FISH OIL) 1000 MG CAPS Take 1,000 mg by mouth daily.     Marland Kitchen omeprazole (PRILOSEC) 20 MG capsule Take 1 capsule (20 mg total) by mouth 2 (two) times daily before a meal. 60 capsule 11  . sertraline (ZOLOFT) 25 MG tablet Take 1 tablet (25 mg total) by mouth daily. 90 tablet 3  . vitamin B-12 (CYANOCOBALAMIN) 1000 MCG tablet Take 2,000 mcg by mouth daily. 2 tabs    . Zinc 50 MG CAPS Take 50 mg by mouth daily.    Marland Kitchen olmesartan (BENICAR) 5 MG tablet Take 1 tablet (5 mg total) by mouth in the morning and at bedtime. 180 tablet  3   No facility-administered medications prior to visit.    Allergies  Allergen Reactions  . Epinephrine Other (See Comments) and Anaphylaxis    Heart palpitations  . Penicillins Swelling and Other (See Comments)    Lip swelling Has patient had a PCN reaction causing immediate rash, facial/tongue/throat swelling, SOB or lightheadedness with hypotension: Yes Has patient had a PCN reaction causing severe rash involving mucus membranes or skin necrosis: No Has patient had a PCN reaction that required hospitalization No Has patient had a PCN reaction occurring within the last 10 years: No If all of the above answers are "NO", then may proceed with Cephalosporin use.  . Lidocaine Hcl Other (See Comments)  . Other     Black pepper- caused bronchial tubes to start closing   . Valium [Diazepam] Other (See Comments)    Other reaction(s): Other (See Comments) Overly sensitive  sedation    Review of Systems  Constitutional: Negative.   HENT: Negative.   Respiratory: Negative.   Cardiovascular: Negative.   Gastrointestinal: Negative.   Genitourinary: Negative.    Musculoskeletal: Negative.   Skin: Negative.   Neurological: Positive for light-headedness (chronic ). Negative for dizziness, tremors, seizures, syncope, facial asymmetry, speech difficulty, weakness, numbness and headaches.  Psychiatric/Behavioral: Negative.        Objective:    Physical Exam Vitals reviewed.  Constitutional:      General: She is not in acute distress.    Appearance: She is well-developed. She is obese. She is not ill-appearing, toxic-appearing or diaphoretic.     Interventions: She is not intubated.    Comments: Patient is alert and oriented and responsive to questions Engages in eye contact with provider. Speaks in full sentences without any pauses without any shortness of breath or distress.   HENT:     Head: Normocephalic and atraumatic.     Right Ear: External ear normal.     Left Ear: External ear normal.     Nose: Nose normal.     Mouth/Throat:     Pharynx: No oropharyngeal exudate.  Eyes:     General: Lids are normal. No scleral icterus.       Right eye: No discharge.        Left eye: No discharge.     Conjunctiva/sclera: Conjunctivae normal.     Right eye: Right conjunctiva is not injected. No exudate or hemorrhage.    Left eye: Left conjunctiva is not injected. No exudate or hemorrhage.    Pupils: Pupils are equal, round, and reactive to light.  Neck:     Thyroid: No thyroid mass or thyromegaly.     Vascular: Normal carotid pulses. No carotid bruit, hepatojugular reflux or JVD.     Trachea: Trachea and phonation normal. No tracheal tenderness or tracheal deviation.     Meningeal: Brudzinski's sign and Kernig's sign absent.  Cardiovascular:     Rate and Rhythm: Normal rate and regular rhythm.     Pulses: Normal pulses.          Radial pulses are 2+ on the right side and 2+ on the left side.       Dorsalis pedis pulses are 2+ on the right side and 2+ on the left side.       Posterior tibial pulses are 2+ on the right side and 2+ on the left side.      Heart sounds: Normal heart sounds, S1 normal and S2 normal. Heart sounds not distant. No murmur heard. No friction rub.  No gallop.   Pulmonary:     Effort: Pulmonary effort is normal. No tachypnea, bradypnea, accessory muscle usage or respiratory distress. She is not intubated.     Breath sounds: Normal breath sounds. No stridor. No wheezing, rhonchi or rales.  Chest:     Chest wall: No tenderness.  Breasts:     Right: No supraclavicular adenopathy.     Left: No supraclavicular adenopathy.    Abdominal:     General: Bowel sounds are normal. There is no distension or abdominal bruit.     Palpations: Abdomen is soft. There is no shifting dullness, fluid wave, hepatomegaly, splenomegaly, mass or pulsatile mass.     Tenderness: There is no abdominal tenderness. There is no guarding or rebound.     Hernia: No hernia is present.  Musculoskeletal:        General: No tenderness or deformity. Normal range of motion.     Cervical back: Full passive range of motion without pain, normal range of motion and neck supple. No edema, erythema or rigidity. No spinous process tenderness or muscular tenderness. Normal range of motion.  Lymphadenopathy:     Head:     Right side of head: No submental, submandibular, tonsillar, preauricular, posterior auricular or occipital adenopathy.     Left side of head: No submental, submandibular, tonsillar, preauricular, posterior auricular or occipital adenopathy.     Cervical: No cervical adenopathy.     Right cervical: No superficial, deep or posterior cervical adenopathy.    Left cervical: No superficial, deep or posterior cervical adenopathy.     Upper Body:     Right upper body: No supraclavicular or pectoral adenopathy.     Left upper body: No supraclavicular or pectoral adenopathy.  Skin:    General: Skin is warm and dry.     Coloration: Skin is not pale.     Findings: No abrasion, bruising, burn, ecchymosis, erythema, lesion, petechiae or rash.      Nails: There is no clubbing.  Neurological:     Mental Status: She is alert and oriented to person, place, and time.     GCS: GCS eye subscore is 4. GCS verbal subscore is 5. GCS motor subscore is 6.     Cranial Nerves: No cranial nerve deficit.     Sensory: No sensory deficit.     Motor: No tremor, atrophy, abnormal muscle tone or seizure activity.     Coordination: Coordination normal.     Gait: Gait normal.     Deep Tendon Reflexes: Reflexes are normal and symmetric. Reflexes normal. Babinski sign absent on the right side. Babinski sign absent on the left side.     Reflex Scores:      Tricep reflexes are 2+ on the right side and 2+ on the left side.      Bicep reflexes are 2+ on the right side and 2+ on the left side.      Brachioradialis reflexes are 2+ on the right side and 2+ on the left side.      Patellar reflexes are 2+ on the right side and 2+ on the left side.      Achilles reflexes are 2+ on the right side and 2+ on the left side. Psychiatric:        Speech: Speech normal.        Behavior: Behavior normal.        Thought Content: Thought content normal.        Judgment: Judgment normal.  BP (!) 118/50   Pulse 74   Temp 98.2 F (36.8 C)   Ht 5' 5.98" (1.676 m)   Wt 189 lb 9.6 oz (86 kg)   SpO2 95%   BMI 30.62 kg/m  Wt Readings from Last 3 Encounters:  12/22/20 189 lb 9.6 oz (86 kg)  12/20/20 185 lb (83.9 kg)  12/15/20 188 lb (85.3 kg)    Health Maintenance Due  Topic Date Due  . Pneumococcal Vaccine 65-17 Years old (1 of 4 - PCV13) Never done  . Zoster Vaccines- Shingrix (2 of 2) 08/11/2011    There are no preventive care reminders to display for this patient.   Lab Results  Component Value Date   TSH 1.540 09/18/2020   Lab Results  Component Value Date   WBC 5.3 12/20/2020   HGB 13.4 12/20/2020   HCT 40.7 12/20/2020   MCV 90.0 12/20/2020   PLT 196 12/20/2020   Lab Results  Component Value Date   NA 140 12/20/2020   K 4.7 12/20/2020    CHLORIDE 112 (H) 04/07/2016   CO2 22 12/20/2020   GLUCOSE 128 (H) 12/20/2020   BUN 18 12/20/2020   CREATININE 0.87 12/20/2020   BILITOT 1.2 09/08/2020   ALKPHOS 68 09/08/2020   AST 13 09/08/2020   ALT 11 09/08/2020   PROT 6.5 09/08/2020   ALBUMIN 4.3 09/08/2020   CALCIUM 9.4 12/20/2020   ANIONGAP 11 12/20/2020   EGFR 60 (L) 04/07/2016   GFR 52.24 (L) 09/08/2020   Lab Results  Component Value Date   CHOL 142 03/25/2020   Lab Results  Component Value Date   HDL 69.80 03/25/2020   Lab Results  Component Value Date   LDLCALC 52 03/25/2020   Lab Results  Component Value Date   TRIG 99.0 03/25/2020   Lab Results  Component Value Date   CHOLHDL 2 03/25/2020   Lab Results  Component Value Date   HGBA1C 6.1 03/25/2020       Assessment & Plan:   Problem List Items Addressed This Visit      Cardiovascular and Mediastinum   Essential hypertension   Relevant Medications   olmesartan (BENICAR) 5 MG tablet     Endocrine   Hypothyroidism - Primary   Relevant Orders   TSH     1. Hypothyroidism, unspecified type Will check lab since she has been taking Synthroid sporadically.  - TSH  2. Essential hypertension Low blood pressure in office today and correlated within 5- 10 of her cuff she has at home. Sent prescription as McLean-Scocuzza, Nino Glow, MD originally ordered to [facility]- patient was previously using [facility] and last script I see Dr. Olivia Mackie had note to discontinue the Benicar 20 mg tablet that patient was taking 1/2 tablet ( 10 mg ) BID.   - olmesartan (BENICAR) 5 MG tablet; Take 1 tablet (5 mg total) by mouth in the morning and at bedtime.  Dispense: 180 tablet; Refill: 0  Meds ordered this encounter  Medications  . olmesartan (BENICAR) 5 MG tablet    Sig: Take 1 tablet (5 mg total) by mouth in the morning and at bedtime.    Dispense:  180 tablet    Refill:  0   Return in about 3 weeks (around 01/12/2021), or if symptoms worsen or fail to improve, for at any  time for any worsening symptoms, Go to Emergency room/ urgent care if worse.  Marcille Buffy, FNP

## 2020-12-22 NOTE — Telephone Encounter (Signed)
Noted  

## 2020-12-23 LAB — TSH: TSH: 1.79 u[IU]/mL (ref 0.35–4.50)

## 2020-12-23 NOTE — Progress Notes (Signed)
TSH within normal limits, would continue current dose of Synthroid and recheck in 4- 6 months to be sure still stable.

## 2020-12-25 ENCOUNTER — Encounter (INDEPENDENT_AMBULATORY_CARE_PROVIDER_SITE_OTHER): Payer: PPO

## 2020-12-28 ENCOUNTER — Telehealth (INDEPENDENT_AMBULATORY_CARE_PROVIDER_SITE_OTHER): Payer: Self-pay

## 2020-12-28 NOTE — Telephone Encounter (Signed)
The pt called and left a VM on the nurses line saying she recently  has a F/U up for a previous laser  laser week and has recently been having some pain that thur-Sat that lasted for about 10 minutes where she felt a stinging and burning an, prickly pain. I made the NP aware and per her this is normal and the pt could have some possible inflammation. The NP advised that if this continues for the pt to take IB-Profen. I called the pt and made her aware of the NP's instructions.

## 2021-01-12 ENCOUNTER — Other Ambulatory Visit: Payer: Self-pay

## 2021-01-12 ENCOUNTER — Encounter: Payer: Self-pay | Admitting: Internal Medicine

## 2021-01-12 ENCOUNTER — Ambulatory Visit (INDEPENDENT_AMBULATORY_CARE_PROVIDER_SITE_OTHER): Payer: PPO | Admitting: Internal Medicine

## 2021-01-12 VITALS — BP 114/62 | HR 64 | Temp 98.1°F | Ht 65.98 in | Wt 189.6 lb

## 2021-01-12 DIAGNOSIS — J984 Other disorders of lung: Secondary | ICD-10-CM | POA: Diagnosis not present

## 2021-01-12 DIAGNOSIS — G4700 Insomnia, unspecified: Secondary | ICD-10-CM

## 2021-01-12 DIAGNOSIS — F32A Depression, unspecified: Secondary | ICD-10-CM

## 2021-01-12 DIAGNOSIS — R1011 Right upper quadrant pain: Secondary | ICD-10-CM

## 2021-01-12 DIAGNOSIS — F419 Anxiety disorder, unspecified: Secondary | ICD-10-CM | POA: Diagnosis not present

## 2021-01-12 DIAGNOSIS — I10 Essential (primary) hypertension: Secondary | ICD-10-CM

## 2021-01-12 DIAGNOSIS — R5383 Other fatigue: Secondary | ICD-10-CM

## 2021-01-12 MED ORDER — TRAZODONE HCL 50 MG PO TABS: 25.0000 mg | ORAL_TABLET | Freq: Every evening | ORAL | 2 refills | Status: DC | PRN

## 2021-01-12 MED ORDER — OLMESARTAN MEDOXOMIL 5 MG PO TABS: 5.0000 mg | ORAL_TABLET | Freq: Two times a day (BID) | ORAL | 3 refills | Status: DC
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
## 2021-01-12 NOTE — Patient Instructions (Addendum)
[facility] [place] [phone]   [facility] [place] [address] [place] [address] [phone]  [place]  [place] [address] [phone]   Call me back on trazodone how this helps for sleep if not consider Lorrin Mais, lunesta let me know   Theraworx for leg cramps   Let me know if you want CT scan abdomen/pelvis in the future   Take care

## 2021-01-20 ENCOUNTER — Telehealth (INDEPENDENT_AMBULATORY_CARE_PROVIDER_SITE_OTHER): Payer: Self-pay | Admitting: Vascular Surgery

## 2021-01-24 ENCOUNTER — Other Ambulatory Visit: Payer: Self-pay | Admitting: Pulmonary Disease

## 2021-01-25 NOTE — Telephone Encounter (Signed)
Documentation only.

## 2021-01-26 ENCOUNTER — Ambulatory Visit (INDEPENDENT_AMBULATORY_CARE_PROVIDER_SITE_OTHER): Payer: PPO | Admitting: Vascular Surgery

## 2021-01-26 ENCOUNTER — Other Ambulatory Visit: Payer: Self-pay

## 2021-01-26 ENCOUNTER — Other Ambulatory Visit (INDEPENDENT_AMBULATORY_CARE_PROVIDER_SITE_OTHER): Payer: PPO | Admitting: Vascular Surgery

## 2021-01-26 VITALS — BP 103/58 | HR 144 | Ht 66.0 in | Wt 193.0 lb

## 2021-01-26 DIAGNOSIS — I83813 Varicose veins of bilateral lower extremities with pain: Secondary | ICD-10-CM

## 2021-01-26 NOTE — Progress Notes (Signed)
[name] is a 80 y.o. female who presents with symptomatic venous reflux  Past Medical History:  Diagnosis Date   Acid reflux    Allergy    Anemia    Anxiety    in past   Arthritis    all over   Asthma    Atypical moles    Bronchitis    Bronchospasm    reactive with perfume/cologne and smoke   CAD (coronary artery disease)    patient denies on preop of 10/24    Candida esophagitis (HCC)    Carotid artery calcification, bilateral 04/2016   Cataract    Cervical stenosis of spine    Chronic insomnia    Complication of anesthesia    shoulder surgery- not all the way asleep   Depression    in past   Diverticulitis    Diverticulosis    Dizziness    Elevated liver enzymes    Family history of adverse reaction to anesthesia    sister- has problems with nausea and vomiting    Fatigue    Fatty liver    Generalized headache    migraines   GERD (gastroesophageal reflux disease)    History of chemotherapy    topical cream for h/o skin CA   History of kidney stones    History of neuropathy    FH Dr. Posey Pronto neurology   History of vertigo    Hx of migraines    Hyperlipidemia    Hypothyroidism    Insomnia    Irritable bowel syndrome    LLQ abdominal pain    Melanoma ([place]) 2013   Melanoma ([place])    x2 stg II melanoma   Neuropathy, peripheral    Peripheral neuropathy    Pneumonia    hx of x 2   PONV (postoperative nausea and vomiting)    Pre-diabetes    Retina disorder    left   SCC (squamous cell carcinoma)    skin    Stroke ([place])    ? TIA per Dr V per patient    Thyroid disease    Urine incontinence    Vitamin D deficiency    Weight gain     Past Surgical History:  Procedure Laterality Date   BILATERAL SALPINGOOPHORECTOMY     ovarian cyst Dr. Ouida Sills and Dr. Alisia Ferrari 2008   BREAST SURGERY     in 2014 bx breast calcifications   BREAST SURGERY     augmentation with saline Dr. Hubbard Hartshorn 1995    CHOLECYSTECTOMY N/A 01/28/2015   Procedure:  LAPAROSCOPIC CHOLECYSTECTOMY WITH INTRAOPERATIVE CHOLANGIOGRAM;  Surgeon: Autumn Messing III, MD;  Location: [facility];  Service: General;  Laterality: N/A;   CLOSED MANIPULATION SHOULDER     rt   COLONOSCOPY WITH PROPOFOL N/A 11/20/2012   Procedure: COLONOSCOPY WITH PROPOFOL;  Surgeon: Garlan Fair, MD;  Location: [facility];  Service: Endoscopy;  Laterality: N/A;   colonscopy     May 2014   JOINT REPLACEMENT     RTK   KNEE ARTHROSCOPY     left knee miniscus tear repair     MELANOMA EXCISION WITH SENTINEL LYMPH NODE BIOPSY  03/18/11   Back, nodes neg   OOPHORECTOMY     ROTATOR CUFF REPAIR     left   SHOULDER SURGERY     right and left    SKIN CANCER EXCISION     multiple   TOTAL HIP ARTHROPLASTY     TOTAL KNEE ARTHROPLASTY  right 2010 and left in 2018    TOTAL KNEE ARTHROPLASTY Left 05/16/2017   Procedure: LEFT TOTAL KNEE ARTHROPLASTY;  Surgeon: Paralee Cancel, MD;  Location: [facility];  Service: Orthopedics;  Laterality: Left;  70 mins   TUBAL LIGATION     TUMOR REMOVAL     ovary     Current Outpatient Medications:    albuterol (PROAIR HFA) 108 (90 Base) MCG/ACT inhaler, Inhale 1-2 puffs into the lungs every 6 (six) hours as needed for wheezing or shortness of breath., Disp: 18 g, Rfl: 11   ALPRAZolam (XANAX) 0.5 MG tablet, SMARTSIG:1 Tablet(s) By Mouth, Disp: , Rfl:    Ascorbic Acid (VITAMIN C) 1000 MG tablet, Take 1,000 mg by mouth daily., Disp: , Rfl:    aspirin 81 MG tablet, TAKE 1 TABLET BY MOUTH EVERY DAY, Disp: , Rfl:    atorvastatin (LIPITOR) 20 MG tablet, Take 1 tablet (20 mg total) by mouth daily., Disp: 90 tablet, Rfl: 3   Calcium 600-200 MG-UNIT tablet, Take 1 tablet daily by mouth., Disp: , Rfl:    Cholecalciferol (VITAMIN D3) 2000 UNITS capsule, Take 2,000 Units by mouth daily., Disp: , Rfl:    clindamycin (CLEOCIN) 150 MG capsule, Take 150 mg by mouth 4 (four) times daily., Disp: , Rfl:    FOLIC ACID PO, Take by mouth daily., Disp: , Rfl:    levothyroxine  (SYNTHROID) 75 MCG tablet, Take 1 tablet (75 mcg total) by mouth daily before breakfast., Disp: 90 tablet, Rfl: 3   Magnesium 250 MG TABS, Take 250 mg by mouth daily., Disp: , Rfl:    olmesartan (BENICAR) 5 MG tablet, Take 1 tablet (5 mg total) by mouth in the morning and at bedtime., Disp: 180 tablet, Rfl: 3   Omega-3 Fatty Acids (FISH OIL) 1000 MG CAPS, Take 1,000 mg by mouth daily. , Disp: , Rfl:    omeprazole (PRILOSEC) 20 MG capsule, Take 1 capsule (20 mg total) by mouth 2 (two) times daily before a meal., Disp: 60 capsule, Rfl: 11   sertraline (ZOLOFT) 25 MG tablet, Take 1 tablet (25 mg total) by mouth daily., Disp: 90 tablet, Rfl: 3   traZODone (DESYREL) 50 MG tablet, Take 0.5-1 tablets (25-50 mg total) by mouth at bedtime as needed for sleep. 1 hr before sleep, Disp: 30 tablet, Rfl: 2   vitamin B-12 (CYANOCOBALAMIN) 1000 MCG tablet, Take 2,000 mcg by mouth daily. 2 tabs, Disp: , Rfl:    Zinc 50 MG CAPS, Take 50 mg by mouth daily., Disp: , Rfl:   Allergies  Allergen Reactions   Epinephrine Other (See Comments) and Anaphylaxis    Heart palpitations   Penicillins Swelling and Other (See Comments)    Lip swelling Has patient had a PCN reaction causing immediate rash, facial/tongue/throat swelling, SOB or lightheadedness with hypotension: Yes Has patient had a PCN reaction causing severe rash involving mucus membranes or skin necrosis: No Has patient had a PCN reaction that required hospitalization No Has patient had a PCN reaction occurring within the last 10 years: No If all of the above answers are "NO", then may proceed with Cephalosporin use.   Lidocaine Hcl Other (See Comments)   Other     Black pepper- caused bronchial tubes to start closing    Valium [Diazepam] Other (See Comments)    Other reaction(s): Other (See Comments) Overly sensitive  sedation     Varicose veins of leg with pain, bilateral     PLAN: The patient's right lower extremity was  sterilely prepped and  draped. The ultrasound machine was used to visualize the saphenous vein throughout its course. A segment in the mid to upper calf was selected for access. The saphenous vein was accessed with minimal difficulty using ultrasound guidance with a micropuncture needle. A 0.018 wire was then placed beyond the saphenofemoral junction and the needle was removed. The 65 cm sheath was then placed over the wire and the wire and dilator were removed. The laser fiber was then placed through the sheath and its tip was placed approximately 4-5 centimeters below the saphenofemoral junction. Tumescent anesthesia was then created with a dilute lidocaine solution. Laser energy was then delivered with constant withdrawal of the sheath and laser fiber. Approximately 1592 joules of energy were delivered over a length of 41 centimeters using a 1470 Hz VenaCure machine at 7 W. Sterile dressings were placed. The patient tolerated the procedure well without obvious complications.   Follow-up in 1 week with post-laser duplex.

## 2021-02-02 ENCOUNTER — Ambulatory Visit (INDEPENDENT_AMBULATORY_CARE_PROVIDER_SITE_OTHER): Payer: PPO

## 2021-02-02 ENCOUNTER — Other Ambulatory Visit: Payer: Self-pay

## 2021-02-02 ENCOUNTER — Other Ambulatory Visit (INDEPENDENT_AMBULATORY_CARE_PROVIDER_SITE_OTHER): Payer: PPO | Admitting: Vascular Surgery

## 2021-02-02 DIAGNOSIS — Z20822 Contact with and (suspected) exposure to covid-19: Secondary | ICD-10-CM | POA: Diagnosis not present

## 2021-02-02 DIAGNOSIS — Z03818 Encounter for observation for suspected exposure to other biological agents ruled out: Secondary | ICD-10-CM | POA: Diagnosis not present

## 2021-02-02 DIAGNOSIS — I83813 Varicose veins of bilateral lower extremities with pain: Secondary | ICD-10-CM | POA: Diagnosis not present

## 2021-02-04 DIAGNOSIS — Z20822 Contact with and (suspected) exposure to covid-19: Secondary | ICD-10-CM | POA: Diagnosis not present

## 2021-02-04 DIAGNOSIS — U071 COVID-19: Secondary | ICD-10-CM

## 2021-02-04 DIAGNOSIS — Z03818 Encounter for observation for suspected exposure to other biological agents ruled out: Secondary | ICD-10-CM | POA: Diagnosis not present

## 2021-02-04 HISTORY — DX: COVID-19: U07.1

## 2021-02-10 ENCOUNTER — Ambulatory Visit: Payer: PPO | Admitting: Internal Medicine

## 2021-02-12 DIAGNOSIS — Z803 Family history of malignant neoplasm of breast: Secondary | ICD-10-CM | POA: Diagnosis not present

## 2021-02-12 DIAGNOSIS — Z1231 Encounter for screening mammogram for malignant neoplasm of breast: Secondary | ICD-10-CM | POA: Diagnosis not present

## 2021-02-12 LAB — HM MAMMOGRAPHY

## 2021-02-16 ENCOUNTER — Ambulatory Visit (INDEPENDENT_AMBULATORY_CARE_PROVIDER_SITE_OTHER): Payer: PPO

## 2021-02-16 ENCOUNTER — Encounter: Payer: Self-pay | Admitting: Internal Medicine

## 2021-02-16 ENCOUNTER — Ambulatory Visit (INDEPENDENT_AMBULATORY_CARE_PROVIDER_SITE_OTHER): Payer: PPO | Admitting: Internal Medicine

## 2021-02-16 ENCOUNTER — Other Ambulatory Visit: Payer: Self-pay

## 2021-02-16 VITALS — BP 116/66 | HR 59 | Temp 96.9°F | Ht 66.0 in | Wt 189.2 lb

## 2021-02-16 DIAGNOSIS — R7989 Other specified abnormal findings of blood chemistry: Secondary | ICD-10-CM | POA: Diagnosis not present

## 2021-02-16 DIAGNOSIS — N3000 Acute cystitis without hematuria: Secondary | ICD-10-CM | POA: Diagnosis not present

## 2021-02-16 DIAGNOSIS — R42 Dizziness and giddiness: Secondary | ICD-10-CM | POA: Diagnosis not present

## 2021-02-16 DIAGNOSIS — U071 COVID-19: Secondary | ICD-10-CM

## 2021-02-16 DIAGNOSIS — I10 Essential (primary) hypertension: Secondary | ICD-10-CM

## 2021-02-16 DIAGNOSIS — R0602 Shortness of breath: Secondary | ICD-10-CM | POA: Diagnosis not present

## 2021-02-16 DIAGNOSIS — R079 Chest pain, unspecified: Secondary | ICD-10-CM

## 2021-02-16 DIAGNOSIS — I890 Lymphedema, not elsewhere classified: Secondary | ICD-10-CM

## 2021-02-16 DIAGNOSIS — R5383 Other fatigue: Secondary | ICD-10-CM | POA: Diagnosis not present

## 2021-02-16 DIAGNOSIS — R413 Other amnesia: Secondary | ICD-10-CM | POA: Diagnosis not present

## 2021-02-16 DIAGNOSIS — G4700 Insomnia, unspecified: Secondary | ICD-10-CM | POA: Diagnosis not present

## 2021-02-16 DIAGNOSIS — I6523 Occlusion and stenosis of bilateral carotid arteries: Secondary | ICD-10-CM | POA: Diagnosis not present

## 2021-02-16 DIAGNOSIS — M549 Dorsalgia, unspecified: Secondary | ICD-10-CM | POA: Diagnosis not present

## 2021-02-16 DIAGNOSIS — Z9181 History of falling: Secondary | ICD-10-CM

## 2021-02-16 LAB — D-DIMER, QUANTITATIVE: D-Dimer, Quant: 1.45 mcg/mL FEU — ABNORMAL HIGH (ref <0.50)

## 2021-02-16 MED ORDER — ESZOPICLONE 1 MG PO TABS: 0.5000 mg | ORAL_TABLET | Freq: Every evening | ORAL | 0 refills | Status: DC | PRN

## 2021-02-16 NOTE — Patient Instructions (Addendum)
L theanine 100-200 mg at night-nature made for sleep  Consider Lunesta   Eszopiclone tablets What is this medication? ESZOPICLONE (es ZOE pi clone) is used to treat insomnia. This medicine helpsyou to fall asleep and sleep through the night. This medicine may be used for other purposes; ask your health care provider orpharmacist if you have questions. COMMON BRAND NAME(S): Lunesta What should I tell my care team before I take this medication? They need to know if you have any of these conditions: depression history of a drug or alcohol abuse problem liver disease lung or breathing disease sleep-walking, driving, eating or other activity while not fully awake after taking a sleep medicine suicidal thoughts an unusual or allergic reaction to eszopiclone, other medicines, foods, dyes, or preservatives pregnant or trying to get pregnant breast-feeding How should I use this medication? Take this medicine by mouth with a glass of water. Follow the directions on the prescription label. It is better to take this medicine on an empty stomach and only when you are ready for bed. Do not take your medicine more often than directed. If you have been taking this medicine for several weeks and suddenly stop taking it, you may get unpleasant withdrawal symptoms. Your doctor or health care professional may want to gradually reduce the dose. Do not stop taking this medicine on your own. Always follow your doctor or health careprofessional's advice. Talk to your pediatrician regarding the use of this medicine in children.Special care may be needed. Overdosage: If you think you have taken too much of this medicine contact apoison control center or emergency room at once. NOTE: This medicine is only for you. Do not share this medicine with others. What if I miss a dose? This does not apply. This medicine should only be taken immediately beforegoing to sleep. Do not take double or extra doses. What may interact  with this medication? herbal medicines like kava kava, melatonin, St. John's wort and valerian lorazepam medicines for fungal infections like ketoconazole, fluconazole, or itraconazole olanzapine This list may not describe all possible interactions. Give your health care provider a list of all the medicines, herbs, non-prescription drugs, or dietary supplements you use. Also tell them if you smoke, drink alcohol, or use illegaldrugs. Some items may interact with your medicine. What should I watch for while using this medication? Visit your doctor or health care professional for regular checks on your progress. Keep a regular sleep schedule by going to bed at about the same time nightly. Avoid caffeine-containing drinks in the evening hours, as caffeine can cause trouble with falling asleep. Talk to your doctor if you still havetrouble sleeping. After taking this medicine, you may get up out of bed and do an activity that you do not know you are doing. The next morning, you may have no memory of this. Activities include driving a car ("sleep-driving"), making and eating food, talking on the phone, sexual activity, and sleep-walking. Serious injuries have occurred. Stop the medicine and call your doctor right away if you find out you have done any of these activities. Do not take this medicine if you have used alcohol that evening. Do not take it if you have taken anothermedicine for sleep. The risk of doing these sleep-related activities is higher. Do not take this medicine unless you are able to stay in bed for a full night (7 to 8 hours) before you must be active again. You may have a decrease in mental alertness the day after use, even  if you feel that you are fully awake. Tell your doctor if you will need to perform activities requiring full alertness, such as driving, the next day. Do not stand or sit up quickly after taking this medicine, especially if you are an older patient. This reduces therisk of  dizzy or fainting spells. If you or your family notice any changes in your behavior, such as new or worsening depression, thoughts of harming yourself, anxiety, other unusual ordisturbing thoughts, or memory loss, call your doctor right away. After you stop taking this medicine, you may have trouble falling asleep. This is called rebound insomnia. This problem usually goes away on its own after 1or 2 nights. What side effects may I notice from receiving this medication? Side effects that you should report to your doctor or health care professionalas soon as possible: allergic reactions like skin rash, itching or hives, swelling of the face, lips, or tongue changes in vision confusion depressed mood feeling faint or lightheaded, falls hallucinations problems with balance, speaking, walking restlessness, excitability, or feelings of agitation unusual activities while not fully awake like driving, eating, making phone calls Side effects that usually do not require medical attention (report to yourdoctor or health care professional if they continue or are bothersome): dizziness, or daytime drowsiness, sometimes called a hangover effect headache This list may not describe all possible side effects. Call your doctor for medical advice about side effects. You may report side effects to FDA at1-800-FDA-1088. Where should I keep my medication? Keep out of the reach of children. This medicine can be abused. Keep your medicine in a safe place to protect it from theft. Do not share this medicine with anyone. Selling or giving away this medicine is dangerous and against thelaw. This medicine may cause accidental overdose and death if taken by other adults, children, or pets. Mix any unused medicine with a substance like cat litter or coffee grounds. Then throw the medicine away in a sealed container like a sealed bag or a coffee can with a lid. Do not use the medicine after theexpiration date. Store at room  temperature between 15 and 30 degrees C (59 and 86 degrees F). NOTE: This sheet is a summary. It may not cover all possible information. If you have questions about this medicine, talk to your doctor, pharmacist, orhealth care provider.  2022 Elsevier/Gold Standard (2017-12-29 11:57:05)  Insomnia Insomnia is a sleep disorder that makes it difficult to fall asleep or stay asleep. Insomnia can cause fatigue, low energy, difficulty concentrating, moodswings, and poor performance at work or school. There are three different ways to classify insomnia: Difficulty falling asleep. Difficulty staying asleep. Waking up too early in the morning. Any type of insomnia can be long-term (chronic) or short-term (acute). Both are common. Short-term insomnia usually lasts for three months or less. Chronic insomnia occurs at least three times a week for longer than threemonths. What are the causes? Insomnia may be caused by another condition, situation, or substance, such as: Anxiety. Certain medicines. Gastroesophageal reflux disease (GERD) or other gastrointestinal conditions. Asthma or other breathing conditions. Restless legs syndrome, sleep apnea, or other sleep disorders. Chronic pain. Menopause. Stroke. Abuse of alcohol, tobacco, or illegal drugs. Mental health conditions, such as depression. Caffeine. Neurological disorders, such as Alzheimer's disease. An overactive thyroid (hyperthyroidism). Sometimes, the cause of insomnia may not be known. What increases the risk? Risk factors for insomnia include: Gender. Women are affected more often than men. Age. Insomnia is more common as you get older.  Stress. Lack of exercise. Irregular work schedule or working night shifts. Traveling between different time zones. Certain medical and mental health conditions. What are the signs or symptoms? If you have insomnia, the main symptom is having trouble falling asleep or having trouble staying asleep.  This may lead to other symptoms, such as: Feeling fatigued or having low energy. Feeling nervous about going to sleep. Not feeling rested in the morning. Having trouble concentrating. Feeling irritable, anxious, or depressed. How is this diagnosed? This condition may be diagnosed based on: Your symptoms and medical history. Your health care provider may ask about: Your sleep habits. Any medical conditions you have. Your mental health. A physical exam. How is this treated? Treatment for insomnia depends on the cause. Treatment may focus on treating an underlying condition that is causing insomnia. Treatment may also include: Medicines to help you sleep. Counseling or therapy. Lifestyle adjustments to help you sleep better. Follow these instructions at home: Eating and drinking  Limit or avoid alcohol, caffeinated beverages, and cigarettes, especially close to bedtime. These can disrupt your sleep. Do not eat a large meal or eat spicy foods right before bedtime. This can lead to digestive discomfort that can make it hard for you to sleep.  Sleep habits  Keep a sleep diary to help you and your health care provider figure out what could be causing your insomnia. Write down: When you sleep. When you wake up during the night. How well you sleep. How rested you feel the next day. Any side effects of medicines you are taking. What you eat and drink. Make your bedroom a dark, comfortable place where it is easy to fall asleep. Put up shades or blackout curtains to block light from outside. Use a white noise machine to block noise. Keep the temperature cool. Limit screen use before bedtime. This includes: Watching TV. Using your smartphone, tablet, or computer. Stick to a routine that includes going to bed and waking up at the same times every day and night. This can help you fall asleep faster. Consider making a quiet activity, such as reading, part of your nighttime routine. Try to  avoid taking naps during the day so that you sleep better at night. Get out of bed if you are still awake after 15 minutes of trying to sleep. Keep the lights down, but try reading or doing a quiet activity. When you feel sleepy, go back to bed.  General instructions Take over-the-counter and prescription medicines only as told by your health care provider. Exercise regularly, as told by your health care provider. Avoid exercise starting several hours before bedtime. Use relaxation techniques to manage stress. Ask your health care provider to suggest some techniques that may work well for you. These may include: Breathing exercises. Routines to release muscle tension. Visualizing peaceful scenes. Make sure that you drive carefully. Avoid driving if you feel very sleepy. Keep all follow-up visits as told by your health care provider. This is important. Contact a health care provider if: You are tired throughout the day. You have trouble in your daily routine due to sleepiness. You continue to have sleep problems, or your sleep problems get worse. Get help right away if: You have serious thoughts about hurting yourself or someone else. If you ever feel like you may hurt yourself or others, or have thoughts about taking your own life, get help right away. You can go to your nearest emergency department or call: Your local emergency services (911 in the [place]  A suicide crisis helpline, such as the [facility] at 717-871-0054. This is open 24 hours a day. Summary Insomnia is a sleep disorder that makes it difficult to fall asleep or stay asleep. Insomnia can be long-term (chronic) or short-term (acute). Treatment for insomnia depends on the cause. Treatment may focus on treating an underlying condition that is causing insomnia. Keep a sleep diary to help you and your health care provider figure out what could be causing your insomnia. This information is not intended  to replace advice given to you by your health care provider. Make sure you discuss any questions you have with your healthcare provider. Document Revised: 05/14/2020 Document Reviewed: 05/14/2020 Elsevier Patient Education  2022 [employer].

## 2021-02-16 NOTE — Progress Notes (Signed)
Chief Complaint  Patient presents with   Follow-up   F/u  1. Mid to low back pain assoc with dizziness urinary freq, lightheadedness covid + 02/04/21, + fatigue and shakiness  She tried ent with vestibular rehab and did not help She is walking with cane had 1 fall  Holter 10/12/20 with dvt run x 4 beats  Orthostatics neg today 2. Htn BP controlled on benicar 5 mg  3.elevated d dimer with sob with do CTA chest   Review of Systems  Constitutional:  Positive for malaise/fatigue. Negative for weight loss.  HENT:  Negative for hearing loss.   Eyes:  Negative for blurred vision.  Respiratory:  Positive for shortness of breath.   Cardiovascular:  Negative for chest pain.  Musculoskeletal:  Positive for falls.  Skin:  Negative for rash.  Neurological:  Positive for dizziness.  Psychiatric/Behavioral:  Positive for memory loss.   Past Medical History:  Diagnosis Date   Acid reflux    Allergy    Anemia    Anxiety    in past   Arthritis    all over   Asthma    Atypical moles    Bronchitis    Bronchospasm    reactive with perfume/cologne and smoke   CAD (coronary artery disease)    patient denies on preop of 10/24    Candida esophagitis (HCC)    Carotid artery calcification, bilateral 04/2016   Cataract    Cervical stenosis of spine    Chronic insomnia    Complication of anesthesia    shoulder surgery- not all the way asleep   COVID-19 02/04/2021   Depression    in past   Diverticulitis    Diverticulosis    Dizziness    Elevated liver enzymes    Family history of adverse reaction to anesthesia    sister- has problems with nausea and vomiting    Fatigue    Fatty liver    Generalized headache    migraines   GERD (gastroesophageal reflux disease)    History of chemotherapy    topical cream for h/o skin CA   History of kidney stones    History of neuropathy    FH Dr. Posey Pronto neurology   History of vertigo    Hx of migraines    Hyperlipidemia    Hypothyroidism     Insomnia    Irritable bowel syndrome    LLQ abdominal pain    Melanoma ([place]) 2013   Melanoma ([place])    x2 stg II melanoma   Neuropathy, peripheral    Peripheral neuropathy    Pneumonia    hx of x 2   PONV (postoperative nausea and vomiting)    Pre-diabetes    Retina disorder    left   SCC (squamous cell carcinoma)    skin    Stroke ([place])    ? TIA per Dr V per patient    Thyroid disease    Urine incontinence    Vitamin D deficiency    Weight gain    Past Surgical History:  Procedure Laterality Date   BILATERAL SALPINGOOPHORECTOMY     ovarian cyst Dr. Ouida Sills and Dr. Alisia Ferrari 2008   BREAST SURGERY     in 2014 bx breast calcifications   BREAST SURGERY     augmentation with saline Dr. Hubbard Hartshorn 1995    CHOLECYSTECTOMY N/A 01/28/2015   Procedure: LAPAROSCOPIC CHOLECYSTECTOMY WITH INTRAOPERATIVE CHOLANGIOGRAM;  Surgeon: Autumn Messing III, MD;  Location: [facility];  Service:  General;  Laterality: N/A;   CLOSED MANIPULATION SHOULDER     rt   COLONOSCOPY WITH PROPOFOL N/A 11/20/2012   Procedure: COLONOSCOPY WITH PROPOFOL;  Surgeon: Garlan Fair, MD;  Location: [facility];  Service: Endoscopy;  Laterality: N/A;   colonscopy     May 2014   JOINT REPLACEMENT     RTK   KNEE ARTHROSCOPY     left knee miniscus tear repair     MELANOMA EXCISION WITH SENTINEL LYMPH NODE BIOPSY  03/18/11   Back, nodes neg   OOPHORECTOMY     ROTATOR CUFF REPAIR     left   SHOULDER SURGERY     right and left    SKIN CANCER EXCISION     multiple   TOTAL HIP ARTHROPLASTY     TOTAL KNEE ARTHROPLASTY     right 2010 and left in 2018    [place] Left 05/16/2017   Procedure: LEFT TOTAL KNEE ARTHROPLASTY;  Surgeon: Paralee Cancel, MD;  Location: [facility];  Service: Orthopedics;  Laterality: Left;  70 mins   TUBAL LIGATION     TUMOR REMOVAL     ovary   Family History  Problem Relation Age of Onset   Hypertension Mother    Stroke Mother    Hyperlipidemia Mother    Heart disease Father     Diabetes Father    Hypertension Father    Hyperlipidemia Father    Early death Father    Diabetes Sister    Heart disease Sister        heart murmur   Arthritis Sister    COPD Sister    Hyperlipidemia Sister    Hypertension Sister    Pancreatic cancer Maternal Uncle    Alzheimer's disease Paternal Aunt    Colon cancer Maternal Grandfather    Cancer Maternal Grandfather    Early death Maternal Grandfather    Alzheimer's disease Paternal Aunt    Alzheimer's disease Paternal Aunt    Colon polyps Sister        adenomous   Arthritis Sister    Heart disease Sister    Hyperlipidemia Sister    Hypertension Sister    Cancer Sister    Heart disease Daughter    Hyperlipidemia Daughter    Hypertension Daughter    Heart disease Son    Hyperlipidemia Son    Alcohol abuse Son    Depression Son    Diabetes Son    Stroke Maternal Grandmother    Early death Paternal Grandmother    Stroke Paternal Grandmother    Cancer Other        2 paternal uncles and 1 maternal uncle colon cancer   Alzheimer's disease Paternal Aunt    Social History   Socioeconomic History   Marital status: Divorced    Spouse name: Not on file   Number of children: 3   Years of education: College   Highest education level: Some college, no degree  Occupational History   Occupation: Retired    Fish farm manager: RETIRED  Tobacco Use   Smoking status: Never   Smokeless tobacco: Never  Vaping Use   Vaping Use: Never used  Substance and Sexual Activity   Alcohol use: Yes    Alcohol/week: 1.0 standard drink    Types: 1 Cans of beer per week    Comment: once a week beer or wine   Drug use: No   Sexual activity: Not Currently  Other Topics Concern   Not on file  Social History Narrative   Patient lives at home alone in a one story home.   Has 3 children (2 daughters and 1 son)    Retired from [employer]   Divorced since 1992    Caffeine Use: 1 cup daily and a soft drink occasionally    No guns     Wears seat belt     Lives with cat    Left handed   Social Determinants of Health   Financial Resource Strain: Not on file  Food Insecurity: Not on file  Transportation Needs: Not on file  Physical Activity: Not on file  Stress: Not on file  Social Connections: Not on file  Intimate Partner Violence: Not on file   Current Meds  Medication Sig   albuterol (PROAIR HFA) 108 (90 Base) MCG/ACT inhaler Inhale 1-2 puffs into the lungs every 6 (six) hours as needed for wheezing or shortness of breath.   Ascorbic Acid (VITAMIN C) 1000 MG tablet Take 1,000 mg by mouth daily.   aspirin 81 MG tablet TAKE 1 TABLET BY MOUTH EVERY DAY   atorvastatin (LIPITOR) 20 MG tablet Take 1 tablet (20 mg total) by mouth daily.   Calcium 600-200 MG-UNIT tablet Take 1 tablet daily by mouth.   Cholecalciferol (VITAMIN D3) 2000 UNITS capsule Take 2,000 Units by mouth daily.   clindamycin (CLEOCIN) 150 MG capsule Take 150 mg by mouth 4 (four) times daily.   eszopiclone (LUNESTA) 1 MG TABS tablet Take 0.5-1 tablets (0.5-1 mg total) by mouth at bedtime as needed for sleep. Take immediately before bedtime   FOLIC ACID PO Take by mouth daily.   levothyroxine (SYNTHROID) 75 MCG tablet Take 1 tablet (75 mcg total) by mouth daily before breakfast.   Magnesium 250 MG TABS Take 250 mg by mouth daily.   olmesartan (BENICAR) 5 MG tablet Take 1 tablet (5 mg total) by mouth in the morning and at bedtime.   Omega-3 Fatty Acids (FISH OIL) 1000 MG CAPS Take 1,000 mg by mouth daily.    omeprazole (PRILOSEC) 20 MG capsule Take 1 capsule (20 mg total) by mouth 2 (two) times daily before a meal.   sertraline (ZOLOFT) 25 MG tablet Take 1 tablet (25 mg total) by mouth daily.   vitamin B-12 (CYANOCOBALAMIN) 1000 MCG tablet Take 2,000 mcg by mouth daily. 2 tabs   Zinc 50 MG CAPS Take 50 mg by mouth daily.   [DISCONTINUED] traZODone (DESYREL) 50 MG tablet Take 0.5-1 tablets (25-50 mg total) by mouth at bedtime as needed for sleep. 1 hr  before sleep   Allergies  Allergen Reactions   Epinephrine Other (See Comments) and Anaphylaxis    Heart palpitations   Penicillins Swelling and Other (See Comments)    Lip swelling Has patient had a PCN reaction causing immediate rash, facial/tongue/throat swelling, SOB or lightheadedness with hypotension: Yes Has patient had a PCN reaction causing severe rash involving mucus membranes or skin necrosis: No Has patient had a PCN reaction that required hospitalization No Has patient had a PCN reaction occurring within the last 10 years: No If all of the above answers are "NO", then may proceed with Cephalosporin use.   Lidocaine Hcl Other (See Comments)   Other     Black pepper- caused bronchial tubes to start closing    Valium [Diazepam] Other (See Comments)    Other reaction(s): Other (See Comments) Overly sensitive  sedation   Recent Results (from the past 2160 hour(s))  Basic metabolic panel  Status: Abnormal   Collection Time: 12/20/20 10:12 AM  Result Value Ref Range   Sodium 140 135 - 145 mmol/L   Potassium 4.7 3.5 - 5.1 mmol/L   Chloride 107 98 - 111 mmol/L   CO2 22 22 - 32 mmol/L   Glucose, Bld 128 (H) 70 - 99 mg/dL    Comment: Glucose reference range applies only to samples taken after fasting for at least 8 hours.   BUN 18 8 - 23 mg/dL   Creatinine, Ser 0.87 0.44 - 1.00 mg/dL   Calcium 9.4 8.9 - 10.3 mg/dL   GFR, Estimated >60 >60 mL/min    Comment: (NOTE) Calculated using the CKD-EPI Creatinine Equation (2021)    Anion gap 11 5 - 15    Comment: Performed at [facility], [address] [place], [place] [address]  CBC     Status: None   Collection Time: 12/20/20 10:12 AM  Result Value Ref Range   WBC 5.3 4.0 - 10.5 K/uL   RBC 4.52 3.87 - 5.11 MIL/uL   Hemoglobin 13.4 12.0 - 15.0 g/dL   HCT 40.7 36.0 - 46.0 %   MCV 90.0 80.0 - 100.0 fL   MCH 29.6 26.0 - 34.0 pg   MCHC 32.9 30.0 - 36.0 g/dL   RDW 13.2 11.5 - 15.5 %   Platelets 196 150 - 400  K/uL   nRBC 0.0 0.0 - 0.2 %    Comment: Performed at [facility], [place], [place] [address]  Troponin I (High Sensitivity)     Status: None   Collection Time: 12/20/20 10:12 AM  Result Value Ref Range   Troponin I (High Sensitivity) 3 <18 ng/L    Comment: (NOTE) Elevated high sensitivity troponin I (hsTnI) values and significant  changes across serial measurements may suggest ACS but many other  chronic and acute conditions are known to elevate hsTnI results.  Refer to the "Links" section for chest pain algorithms and additional  guidance. Performed at [facility], [address] [place], [place] [address]   TSH     Status: None   Collection Time: 12/22/20  2:05 PM  Result Value Ref Range   TSH 1.79 0.35 - 4.50 uIU/mL  HM MAMMOGRAPHY     Status: None   Collection Time: 02/12/21 12:00 AM  Result Value Ref Range   HM Mammogram 0-4 Bi-Rad 0-4 Bi-Rad, Self Reported Normal    Comment: [facility]  Urinalysis, Routine w reflex microscopic     Status: None   Collection Time: 02/16/21  3:24 PM  Result Value Ref Range   Color, Urine YELLOW YELLOW   APPearance CLEAR CLEAR   Specific Gravity, Urine 1.009 1.001 - 1.035   pH < OR = 5.0 5.0 - 8.0   Glucose, UA NEGATIVE NEGATIVE   Bilirubin Urine NEGATIVE NEGATIVE   Ketones, ur NEGATIVE NEGATIVE   Hgb urine dipstick NEGATIVE NEGATIVE   Protein, ur NEGATIVE NEGATIVE   Nitrite NEGATIVE NEGATIVE   Leukocytes,Ua NEGATIVE NEGATIVE  D-Dimer, Quantitative     Status: Abnormal   Collection Time: 02/16/21  3:39 PM  Result Value Ref Range   D-Dimer, Quant 1.45 (H) <0.50 mcg/mL FEU    Comment: . The D-Dimer test is used frequently to exclude an acute PE or DVT. In patients with a low to moderate clinical risk assessment and a D-Dimer result <0.50 mcg/mL FEU, the likelihood of a PE or DVT is very low. However, a thromboembolic event should  not be excluded solely on the basis of the D-Dimer level.  Increased levels of D-Dimer are associated with a PE, DVT, DIC, malignancies, inflammation, sepsis, surgery, trauma, pregnancy, and advancing patient age. [Jama 2006 11:295(2):199-207] . For additional information, please refer to: http://education.questdiagnostics.com/faq/FAQ149 (This link is being provided for informational/ educational purposes only) .    Objective  Body mass index is 30.54 kg/m. Wt Readings from Last 3 Encounters:  02/16/21 189 lb 3.2 oz (85.8 kg)  01/26/21 193 lb (87.5 kg)  01/12/21 189 lb 9.6 oz (86 kg)   Temp Readings from Last 3 Encounters:  02/16/21 (!) 96.9 F (36.1 C) (Temporal)  01/12/21 98.1 F (36.7 C)  12/22/20 98.2 F (36.8 C)   BP Readings from Last 3 Encounters:  02/16/21 116/66  01/26/21 (!) 103/58  01/12/21 114/62   Pulse Readings from Last 3 Encounters:  02/16/21 (!) 59  01/26/21 (!) 144  01/12/21 64    Physical Exam Vitals and nursing note reviewed.  Constitutional:      Appearance: Normal appearance. She is well-developed and well-groomed. She is obese.  HENT:     Head: Normocephalic and atraumatic.  Eyes:     Conjunctiva/sclera: Conjunctivae normal.     Pupils: Pupils are equal, round, and reactive to light.  Cardiovascular:     Rate and Rhythm: Normal rate and regular rhythm.     Heart sounds: Normal heart sounds. No murmur heard. Pulmonary:     Effort: Pulmonary effort is normal.     Breath sounds: Normal breath sounds.  Skin:    General: Skin is warm and dry.  Neurological:     General: No focal deficit present.     Mental Status: She is alert and oriented to person, place, and time. Mental status is at baseline.     Gait: Gait abnormal.  Psychiatric:        Attention and Perception: Attention and perception normal.        Mood and Affect: Mood and affect normal.        Speech: Speech normal.        Behavior: Behavior normal. Behavior is cooperative.        Thought Content: Thought content normal.         Cognition and Memory: Cognition normal.        Judgment: Judgment normal.    Assessment  Plan  Positive D dimer - Plan: CT Angio Chest W/Cm &/Or Wo Cm, Creatinine, BUN  Acute cystitis without hematuria - Plan: Urinalysis, Routine w reflex microscopic, Urine Culture, Creatinine, BUN  Dizziness - Plan: DG Chest 2 View neg.  D-Dimer, Quantitative Orthostatics neg today  Bilateral carotid artery stenosis - Plan: US Carotid Duplex Bilateral  Lymphedema Lymph pumps helping  Memory loss F/u neurology prn   Insomnia, unspecified type - Plan: eszopiclone (LUNESTA) 1 MG TABS tablet Trazadone she thinks making dizzy reviewed side effects 20-28% chance   Fatigue, unspecified type - Plan: DG Chest 2 View r/o pna  SOB (shortness of breath) - Plan: DG Chest 2 View, CT Angio Chest W/Cm &/Or Wo Cm, Creatinine, BUN  COVID-19 - Plan: DG Chest 2 View, CT Angio Chest W/Cm &/Or Wo Cm, Creatinine, BUN  Mid back pain +mid back arthritis on cxr- Plan: CT Angio Chest W/Cm &/Or Wo Cm, Creatinine, BUN  At high risk for falls Order home PT /OT [facility]   Hypertension, unspecified type  Controlled benicar 5 mg bid   HM Flu shot utd Tdap -had TD 10/18/12  not pertussis will need this in future  shingrix will need if has not had  zostervax had 06/16/11  prevnar had 05/15/14  covid 4/4 vaccines utd pfizer covid + 02/04/21  Pna 23 had 12/16/05, 05/18/16    Pap out of age window LMP 85s both ovaries and Fts removed 2008   mammo [name] with implants 01/21/20 normal  -10/18/16 screening with implants neg FH breast cancer  p cousins dx'ed in 29s x 3, sister  Had at age 26 y.o and colon cancer age 38, 2 paternal cousins ovarian cancer age 106 and 39  01/21/20 negative ordered [facility] for 01/20/21 and scheduled and neg    Colonoscopy 11/2012 tortuous colon no further rec.  -she is established and f/u GI Dr. Farrel Gordon [place] virtual coloscopy had 02/25/20 -leb  02/25/20 CT colonoscopy-leb GI   IMPRESSION: 1. Suboptimal  evaluation of the sigmoid, secondary to underdistention. No circumferential mass within this region. More proximally, no evidence of clinically significant colonic polyp or mass. 2. Small hiatal hernia. 3. Aortic Atherosclerosis (ICD10-I70.0).     DEXA Sadie [facility]er PCP 06/28/16 normal reviewed report did not scan into chart -h/o vitamin D def on D3 2000 IU daily     [facility]at[facility]" [facility] Dr. Delman Cheadle mohs surgery  F/u from 04/10/19 left lower leg cellulitis for SCC changed to levaquin rec compression socks and acidic acid soaks qhs   05/15/19 f/u appt s/p SCC mohs removal f/u in 3 months Dr. Winifred Olive  F/u with Dr. Delman Cheadle derm and [facility] mohs as scheduled freq f/us   Echo 01/07/20  FINDINGS   Left Ventricle: Left ventricular ejection fraction, by estimation, is 60  to 65%. The left ventricle has normal function. The left ventricle has no  regional wall motion abnormalities. The left ventricular internal cavity  size was normal in size. There is   borderline left ventricular hypertrophy. Left ventricular diastolic  parameters are consistent with age-related delayed relaxation (normal).   Right Ventricle: The right ventricular size is normal. No increase in  right ventricular wall thickness. Right ventricular systolic function is  normal. There is normal pulmonary artery systolic pressure. The tricuspid  regurgitant velocity is 2.46 m/s, and   with an assumed right atrial pressure of 3 mmHg, the estimated right  ventricular systolic pressure is [id number] mmHg.   Left Atrium: Left atrial size was normal in size.   Right Atrium: Right atrial size was normal in size.   Pericardium: There is no evidence of pericardial effusion.   Mitral Valve: The mitral valve is degenerative in appearance. There is  mild thickening of the mitral valve leaflet(s). Mild mitral valve  regurgitation. No evidence of mitral valve stenosis. MV peak gradient, 3.7  mmHg. The mean mitral valve  gradient is  1.0 mmHg.   Tricuspid Valve: The tricuspid valve is normal in structure. Tricuspid  valve regurgitation is mild to moderate.   Aortic Valve: The aortic valve is tricuspid. Aortic valve regurgitation is  not visualized. No aortic stenosis is present. Aortic valve mean gradient  measures 4.0 mmHg. Aortic valve peak gradient measures 9.1 mmHg. Aortic  valve area, by VTI measures 2.19  cm.   Pulmonic Valve: The pulmonic valve was not well visualized. Pulmonic valve  regurgitation is mild.   Aorta: The aortic root is normal in size and structure.   Pulmonary Artery: The pulmonary artery is not well seen.   Venous: The inferior vena cava is normal in size with greater than 50%  respiratory variability, suggesting right atrial pressure of 3 mmHg.   IAS/Shunts: The interatrial septum was not well visualized.   Specialists  [facility] saw 01/01/19 increased nocturia and urgency pending Botox to tx and UDS Dr. Junious Silk and try myrbetriq before UDS    Cards-Dr. Rockey Situ leb GI Leb GI Dr. Moss Mc MD Dr. Cordelia Pen [facility]-retinal specialist Dentist Dr. Lynelle Smoke Neurology-[facility] Neurology Dr. Posey Pronto  ENT-Dr. Tami Ribas for vestibular balance issue s Podiatry Dr. Milinda Pointer H/o Dr. Earlie Server [facility] Lung-Dr. Patsey Berthold , Leb in [place] Ortho Dr. Alvan Dame Surgery Dr.Toth   Dermatology Dr. Delman Cheadle in [place] h/o [facility] left leg and MM right leg, neck and back with node dissection Dr. Marlou Starks 8/12    Former PCP Dr. Jossie Ng -pt brought in all records week of 09/21/2018 and reviewed -h/o iron def anemia -last seen 07/09/18 and 08/09/18 possible thrush and URI   US carotid 05/13/16 <50% stenosis in right and left ICA -she was at this time put on lipitor 10 mg qhs   CT ab/pelvis 07/26/17 left base scarring lung, well circumcised liver lesion likely cysts and stable hepatomegaly 19.4 cm cranlocaudal pancreas/spleen normal, extensive colonic diverticulosis, aortic and branch vessell atherosclerosis no adenopathy,  normal uterus and adnexa, advanced LS spondylosis grade 1 L4/5 and L5/S1 anterolisthesis are similar, pelvic floor laxity, hiatal hernia CXR 02/26/18 thoracic aorta mildly atherosclerotic unchanged, Deg changes T spine, Deg changes b/l shoulders prior right shoulder surgery calcification distal left supraspinatus tendon at its insertion on the great tuberosity of the left humerus   Provider: Dr. Olivia Mackie McLean-Scocuzza-Internal Medicine

## 2021-02-17 ENCOUNTER — Ambulatory Visit
Admission: RE | Admit: 2021-02-17 | Discharge: 2021-02-17 | Disposition: A | Discharge status: Home / Self Care | Payer: PPO | Source: Ambulatory Visit | Attending: Internal Medicine | Admitting: Internal Medicine

## 2021-02-17 ENCOUNTER — Telehealth: Payer: Self-pay | Admitting: Internal Medicine

## 2021-02-17 DIAGNOSIS — K449 Diaphragmatic hernia without obstruction or gangrene: Secondary | ICD-10-CM | POA: Diagnosis not present

## 2021-02-17 DIAGNOSIS — M549 Dorsalgia, unspecified: Secondary | ICD-10-CM | POA: Diagnosis not present

## 2021-02-17 DIAGNOSIS — R0602 Shortness of breath: Secondary | ICD-10-CM

## 2021-02-17 DIAGNOSIS — U071 COVID-19: Secondary | ICD-10-CM | POA: Diagnosis not present

## 2021-02-17 DIAGNOSIS — R079 Chest pain, unspecified: Secondary | ICD-10-CM | POA: Insufficient documentation

## 2021-02-17 DIAGNOSIS — R7989 Other specified abnormal findings of blood chemistry: Secondary | ICD-10-CM

## 2021-02-17 LAB — URINALYSIS, ROUTINE W REFLEX MICROSCOPIC
Bilirubin Urine: NEGATIVE
Glucose, UA: NEGATIVE
Hgb urine dipstick: NEGATIVE
Ketones, ur: NEGATIVE
Leukocytes,Ua: NEGATIVE
Nitrite: NEGATIVE
Protein, ur: NEGATIVE
Specific Gravity, Urine: 1.009 (ref 1.001–1.035)
pH: <=5 (ref 5.0–8.0)

## 2021-02-17 LAB — URINE CULTURE
MICRO NUMBER:: 12191396
SPECIMEN QUALITY:: ADEQUATE

## 2021-02-17 LAB — POCT I-STAT CREATININE: Creatinine, Ser: 0.9 mg/dL (ref 0.44–1.00)

## 2021-02-17 MED ORDER — IOHEXOL 350 MG/ML SOLN
75.0000 mL | Freq: Once | INTRAVENOUS | Status: AC | PRN
  Administered 2021-02-17: 75 mL via INTRAVENOUS

## 2021-02-17 NOTE — Telephone Encounter (Signed)
Pt called to get CT results

## 2021-02-17 NOTE — Telephone Encounter (Signed)
Left message to return call. See result note

## 2021-02-17 NOTE — Progress Notes (Signed)
Left message to return call 

## 2021-02-18 ENCOUNTER — Ambulatory Visit: Payer: PPO | Admitting: Pulmonary Disease

## 2021-02-18 ENCOUNTER — Other Ambulatory Visit: Payer: Self-pay

## 2021-02-18 ENCOUNTER — Encounter: Payer: Self-pay | Admitting: Pulmonary Disease

## 2021-02-18 VITALS — BP 130/76 | HR 94 | Temp 98.2°F | Ht 66.0 in | Wt 187.0 lb

## 2021-02-18 DIAGNOSIS — R0600 Dyspnea, unspecified: Secondary | ICD-10-CM | POA: Diagnosis not present

## 2021-02-18 DIAGNOSIS — J683 Other acute and subacute respiratory conditions due to chemicals, gases, fumes and vapors: Secondary | ICD-10-CM

## 2021-02-18 DIAGNOSIS — K2100 Gastro-esophageal reflux disease with esophagitis, without bleeding: Secondary | ICD-10-CM

## 2021-02-18 DIAGNOSIS — R0609 Other forms of dyspnea: Secondary | ICD-10-CM

## 2021-02-18 NOTE — Patient Instructions (Addendum)
The CT scan of the chest performed yesterday looked good.  The D-dimer was elevated due to inflammation in the body created by COVID.  Your symptoms of weakness may be still related to all of this.  This should continue to get better.  Try to remain as active as he can.  We will see him in follow-up in 6 months time call sooner should any new problems arise.  Patient was reassured.  Her to CT scan of the chest

## 2021-02-18 NOTE — Progress Notes (Signed)
Subjective:    Patient ID: [name], female    DOB: February 06, 1941, 80 y.o.   MRN: [id number] Chief Complaint  Patient presents with   Follow-up    Sob,weak, nasal congestion.     HPI Patient is an 80 year old lifelong never smoker who presents for follow-up on the issue of cough.  Initially noted around December 2019.  Cough previously responded to aggressive management of gastroesophageal reflux she does have a small hiatal hernia evidence on prior CT scans of the chest.  Has been controlled on PPI twice a day.  She presents today because of feeling weak and short of breath and with nasal congestion after having had COVID-19 on 21 July .  The documenting test was performed at [facility].  The shortness of breath is expressed more as fatigue.  She has shown evidence of airways reactivity on prior pulmonary function testing performed in June 2021.  She has not noted significant improvement of symptoms with maintenance inhalers in the past.  She has not had recent fever chills and sweats since her diagnosis. Her symptoms today are mostly vague.  These may be residual from her recent COVID 19 infection.  Her main complaint today is that of nasal congestion.  We did review her recent laboratory data D-dimer on 2 August was noted to be elevated but this is likely related to her COVID-19 diagnosis.  CT chest performed the following day did not show PE.  Her CT scan is benign with the exception of some MINIMAL apical pleural-parenchymal fibrosis likely residual from prior infection and a very small hiatal hernia.  She voices no other complaint today.   Review of Systems A 10 point review of systems was performed and it is as noted above otherwise negative.  Patient Active Problem List   Diagnosis Date Noted   Anxiety and depression 01/12/2021   Insomnia 01/12/2021   Fatigue 01/12/2021   Varicose veins of leg with pain, bilateral 10/06/2020   Cerebrovascular accident (CVA) ([place])  09/22/2020   Mid back pain 09/22/2020   Lymphedema 08/21/2020   CAD (coronary artery disease) 07/20/2020   Abnormal CT of the chest 05/07/2020   Dyspnea on exertion 05/07/2020   Dysplastic nevus 02/28/2020   Diverticulosis 02/28/2020   Obesity (BMI 30-39.9) 02/28/2020   SCCA (squamous cell carcinoma) of skin 01/03/2020   Dry eyes 12/05/2019   Arthritis of hand 12/03/2019   Carpal tunnel syndrome of left wrist 12/03/2019   Scarring of lung 11/01/2019   Liver cyst 11/01/2019   Essential hypertension 11/01/2019   Carotid artery stenosis 08/13/2019   Cervical spinal stenosis 08/13/2019   Skin cancer 07/17/2019   Leg wound, left 05/14/2019   Overactive bladder 03/08/2019   Chronic cough 02/19/2019   Irritable bowel syndrome with diarrhea 02/19/2019   Depression, recurrent ([place]) 02/19/2019   Lumbosacral spondylosis without myelopathy 12/06/2018   Flushing 10/27/2018   Aortic atherosclerosis ([place]) 10/10/2018   Chest tightness 10/10/2018   Idiopathic neuropathy 09/13/2018   History of stroke 09/13/2018   History of squamous cell carcinoma of skin 09/13/2018   History of depression 09/13/2018   History of migraine 09/13/2018   Fatty liver 09/13/2018   GERD (gastroesophageal reflux disease) 09/13/2018   History of memory loss 09/13/2018   Prediabetes 09/13/2018   Hiatal hernia 09/13/2018   Retinal drusen of both eyes 12/14/2017   Asthma in remission 05/19/2017   Primary osteoarthritis of left knee 05/19/2017   Female stress incontinence 05/08/2017   Pain in pelvis  05/08/2017   Coronary artery calcification seen on CAT scan 04/09/2017   Carotid stenosis, asymptomatic, bilateral 04/09/2017   Hyperlipidemia 04/09/2017   MCI (mild cognitive impairment) 12/17/2015   Urinary frequency 08/09/2015   Hypothyroidism 08/09/2015   Syncope 08/08/2015   Posterior vitreous detachment of right eye 07/23/2015   Hereditary and idiopathic peripheral neuropathy 05/11/2015   Epiretinal membrane,  left 04/12/2012   Nuclear cataract 04/12/2012   Age-related nuclear cataract, bilateral 04/12/2012   Breast pain in female 07/18/2011   Melanoma of back ([place]) 02/28/2011   Social History   Tobacco Use   Smoking status: Never   Smokeless tobacco: Never  Substance Use Topics   Alcohol use: Yes    Alcohol/week: 1.0 standard drink    Types: 1 Cans of beer per week    Comment: once a week beer or wine   Allergies  Allergen Reactions   Epinephrine Other (See Comments) and Anaphylaxis    Heart palpitations   Penicillins Swelling and Other (See Comments)    Lip swelling Has patient had a PCN reaction causing immediate rash, facial/tongue/throat swelling, SOB or lightheadedness with hypotension: Yes Has patient had a PCN reaction causing severe rash involving mucus membranes or skin necrosis: No Has patient had a PCN reaction that required hospitalization No Has patient had a PCN reaction occurring within the last 10 years: No If all of the above answers are "NO", then may proceed with Cephalosporin use.   Lidocaine Hcl Other (See Comments)   Other     Black pepper- caused bronchial tubes to start closing    Valium [Diazepam] Other (See Comments)    Other reaction(s): Other (See Comments) Overly sensitive  sedation   Current Meds  Medication Sig   albuterol (PROAIR HFA) 108 (90 Base) MCG/ACT inhaler Inhale 1-2 puffs into the lungs every 6 (six) hours as needed for wheezing or shortness of breath.   Ascorbic Acid (VITAMIN C) 1000 MG tablet Take 1,000 mg by mouth daily.   aspirin 81 MG tablet TAKE 1 TABLET BY MOUTH EVERY DAY   atorvastatin (LIPITOR) 20 MG tablet Take 1 tablet (20 mg total) by mouth daily.   Calcium 600-200 MG-UNIT tablet Take 1 tablet daily by mouth.   Cholecalciferol (VITAMIN D3) 2000 UNITS capsule Take 2,000 Units by mouth daily.   clindamycin (CLEOCIN) 150 MG capsule Take 150 mg by mouth 4 (four) times daily.   eszopiclone (LUNESTA) 1 MG TABS tablet Take 0.5-1  tablets (0.5-1 mg total) by mouth at bedtime as needed for sleep. Take immediately before bedtime   FOLIC ACID PO Take by mouth daily.   levothyroxine (SYNTHROID) 75 MCG tablet Take 1 tablet (75 mcg total) by mouth daily before breakfast.   Magnesium 250 MG TABS Take 250 mg by mouth daily.   olmesartan (BENICAR) 5 MG tablet Take 1 tablet (5 mg total) by mouth in the morning and at bedtime.   Omega-3 Fatty Acids (FISH OIL) 1000 MG CAPS Take 1,000 mg by mouth daily.    omeprazole (PRILOSEC) 20 MG capsule Take 1 capsule (20 mg total) by mouth 2 (two) times daily before a meal.   sertraline (ZOLOFT) 25 MG tablet Take 1 tablet (25 mg total) by mouth daily.   vitamin B-12 (CYANOCOBALAMIN) 1000 MCG tablet Take 2,000 mcg by mouth daily. 2 tabs   Zinc 50 MG CAPS Take 50 mg by mouth daily.   Immunization History  Administered Date(s) Administered   Fluad Quad(high Dose 65+) 04/25/2019, 04/14/2020   Influenza  Split 04/18/2017   Influenza, High Dose Seasonal PF 04/18/2017, 04/12/2018   Influenza-Unspecified 05/03/2018   PFIZER Comirnaty(Gray Top)Covid-19 Tri-Sucrose Vaccine 12/03/2020   PFIZER(Purple Top)SARS-COV-2 Vaccination 08/12/2019, 09/02/2019, 03/16/2020   Pneumococcal Conjugate-13 05/15/2014   Pneumococcal Polysaccharide-23 12/16/2005, 05/18/2016   Td 10/18/2012   Zoster Recombinat (Shingrix) 06/16/2011       Objective:   Physical Exam BP 130/76 (BP Location: Left Arm, Patient Position: Sitting, Cuff Size: Normal)   Pulse 94   Temp 98.2 F (36.8 C) (Oral)   Ht '5\' 6"'$  (1.676 m)   Wt 187 lb (84.8 kg)   SpO2 (!) 68%   BMI 30.18 kg/m   GENERAL: Well-developed somewhat overweight female in no acute distress. HEAD: Normocephalic, atraumatic. EYES: Pupils equal, round, reactive to light.  No scleral icterus. MOUTH: Nose/mouth/throat not examined due to masking requirements for COVID 19. NECK: Supple. No thyromegaly. Trachea midline. No JVD.  No adenopathy. PULMONARY: Good air entry  bilaterally.  Somewhat coarse, no other adventitious sounds. CARDIOVASCULAR: S1 and S2. Regular rate and rhythm.  No rubs, murmurs or gallops heard. ABDOMEN: Benign. MUSCULOSKELETAL: No joint deformity, no clubbing, no edema. NEUROLOGIC: No focal deficits, no gait disturbance.  Speech is fluent. SKIN: Intact,warm,dry. PSYCH: Anxious mood, normal behavior.  Representative images of CT chest performed 17 February 2021, showing no abnormality:        Assessment & Plan:     ICD-10-CM   1. Reactive airways dysfunction syndrome (HCC)  J68.3    Continue use of albuterol as needed Has not found benefit from maintenance inhalers in the past Triggered by GERD.    2. 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 Dyspnea on exertion  R06.00    Residual post COVID-19 Recommended to stay active Will improve over time Chest CT benign No evidence of hypoxia     The patient was reassured.  CT scan of the chest did not show any evidence of PE.  She was concerned with regards to her D-dimer explained to her that this was due to inflammation created by COVID-19 her symptoms of weakness may still related to lingering COVID-19 symptoms.  She should remain as active as she can as this will help her recuperate better.  We will see the patient in follow-up in 6 months time she is to contact us prior to that time should any new difficulties arise.  Renold Don, MD [facility]    *This note was dictated using voice recognition software/Dragon.  Despite best efforts to proofread, errors can occur which can change the meaning.  Any change was purely unintentional.

## 2021-02-24 ENCOUNTER — Other Ambulatory Visit: Payer: Self-pay

## 2021-02-24 ENCOUNTER — Encounter: Payer: Self-pay | Admitting: Emergency Medicine

## 2021-02-24 ENCOUNTER — Emergency Department
Admission: EM | Admit: 2021-02-24 | Discharge: 2021-02-24 | Disposition: A | Discharge status: Home / Self Care | Payer: PPO | Attending: Emergency Medicine | Admitting: Emergency Medicine

## 2021-02-24 ENCOUNTER — Emergency Department: Payer: PPO

## 2021-02-24 DIAGNOSIS — J45909 Unspecified asthma, uncomplicated: Secondary | ICD-10-CM | POA: Diagnosis not present

## 2021-02-24 DIAGNOSIS — Z7982 Long term (current) use of aspirin: Secondary | ICD-10-CM | POA: Diagnosis not present

## 2021-02-24 DIAGNOSIS — M26629 Arthralgia of temporomandibular joint, unspecified side: Secondary | ICD-10-CM

## 2021-02-24 DIAGNOSIS — Z85820 Personal history of malignant melanoma of skin: Secondary | ICD-10-CM | POA: Diagnosis not present

## 2021-02-24 DIAGNOSIS — R6884 Jaw pain: Secondary | ICD-10-CM | POA: Diagnosis present

## 2021-02-24 DIAGNOSIS — M26622 Arthralgia of left temporomandibular joint: Secondary | ICD-10-CM | POA: Diagnosis not present

## 2021-02-24 DIAGNOSIS — I2510 Atherosclerotic heart disease of native coronary artery without angina pectoris: Secondary | ICD-10-CM | POA: Insufficient documentation

## 2021-02-24 DIAGNOSIS — Z96653 Presence of artificial knee joint, bilateral: Secondary | ICD-10-CM | POA: Insufficient documentation

## 2021-02-24 DIAGNOSIS — M26602 Left temporomandibular joint disorder, unspecified: Secondary | ICD-10-CM | POA: Diagnosis not present

## 2021-02-24 DIAGNOSIS — Z96643 Presence of artificial hip joint, bilateral: Secondary | ICD-10-CM | POA: Insufficient documentation

## 2021-02-24 DIAGNOSIS — R42 Dizziness and giddiness: Secondary | ICD-10-CM | POA: Diagnosis not present

## 2021-02-24 DIAGNOSIS — I10 Essential (primary) hypertension: Secondary | ICD-10-CM | POA: Insufficient documentation

## 2021-02-24 DIAGNOSIS — G9389 Other specified disorders of brain: Secondary | ICD-10-CM | POA: Diagnosis not present

## 2021-02-24 DIAGNOSIS — Z79899 Other long term (current) drug therapy: Secondary | ICD-10-CM | POA: Diagnosis not present

## 2021-02-24 DIAGNOSIS — H9209 Otalgia, unspecified ear: Secondary | ICD-10-CM | POA: Insufficient documentation

## 2021-02-24 DIAGNOSIS — Z8616 Personal history of COVID-19: Secondary | ICD-10-CM | POA: Insufficient documentation

## 2021-02-24 DIAGNOSIS — G319 Degenerative disease of nervous system, unspecified: Secondary | ICD-10-CM | POA: Diagnosis not present

## 2021-02-24 DIAGNOSIS — E039 Hypothyroidism, unspecified: Secondary | ICD-10-CM | POA: Diagnosis not present

## 2021-02-24 LAB — URINALYSIS, COMPLETE (UACMP) WITH MICROSCOPIC
Bacteria, UA: NONE SEEN
Bilirubin Urine: NEGATIVE
Glucose, UA: NEGATIVE mg/dL
Hgb urine dipstick: NEGATIVE
Ketones, ur: NEGATIVE mg/dL
Nitrite: NEGATIVE
Protein, ur: NEGATIVE mg/dL
Specific Gravity, Urine: 1.006 (ref 1.005–1.030)
pH: 5 (ref 5.0–8.0)

## 2021-02-24 LAB — SEDIMENTATION RATE: Sed Rate: 3 mm/hr (ref 0–30)

## 2021-02-24 LAB — BASIC METABOLIC PANEL
Anion gap: 10 (ref 5–15)
BUN: 14 mg/dL (ref 8–23)
CO2: 24 mmol/L (ref 22–32)
Calcium: 9.1 mg/dL (ref 8.9–10.3)
Chloride: 105 mmol/L (ref 98–111)
Creatinine, Ser: 0.96 mg/dL (ref 0.44–1.00)
GFR, Estimated: 60 mL/min — ABNORMAL LOW (ref >60)
Glucose, Bld: 141 mg/dL — ABNORMAL HIGH (ref 70–99)
Potassium: 4.2 mmol/L (ref 3.5–5.1)
Sodium: 139 mmol/L (ref 135–145)

## 2021-02-24 LAB — CBC
HCT: 41.2 % (ref 36.0–46.0)
Hemoglobin: 13.7 g/dL (ref 12.0–15.0)
MCH: 30.3 pg (ref 26.0–34.0)
MCHC: 33.3 g/dL (ref 30.0–36.0)
MCV: 91.2 fL (ref 80.0–100.0)
Platelets: 176 10*3/uL (ref 150–400)
RBC: 4.52 MIL/uL (ref 3.87–5.11)
RDW: 13.2 % (ref 11.5–15.5)
WBC: 5.3 10*3/uL (ref 4.0–10.5)
nRBC: 0 % (ref 0.0–0.2)

## 2021-02-24 NOTE — ED Triage Notes (Signed)
Pt comes into the ED via POV c/o dizziness, left otalgia and left jaw pain that been ongoing a couple days.  Pt states she has h/o stroke and is currently scheduled for a carotid US next week.  Pt neurologically intact at this time with even and unlabored respirations.

## 2021-02-24 NOTE — ED Provider Notes (Signed)
[facility] Emergency Department Provider Note  ____________________________________________  Time seen: Approximately 4:02 PM  I have reviewed the triage vital signs and the nursing notes.   HISTORY  Chief Complaint Dizziness, Otalgia, and Jaw Pain    HPI [name] is a 80 y.o. female with a past history of anxiety CAD, GERD and prior stroke who woke up this morning with left jaw pain.  Its intermittent, sharp and severe, nonradiating, no aggravating or alleviating factors.  She does endorse a history of teeth grinding when she is stressed.  She has been feeling more stressed lately.  Denies any chest pain shortness of breath or exertional symptoms.  She is worried about having a stroke, and called her doctor who told her to come to the ED for evaluation.  She denies any numbness tingling weakness vision changes or neck pain.  No changes in balance or coordination, no trauma    Past Medical History:  Diagnosis Date   Acid reflux    Allergy    Anemia    Anxiety    in past   Arthritis    all over   Asthma    Atypical moles    Bronchitis    Bronchospasm    reactive with perfume/cologne and smoke   CAD (coronary artery disease)    patient denies on preop of 10/24    Candida esophagitis (HCC)    Carotid artery calcification, bilateral 04/2016   Cataract    Cervical stenosis of spine    Chronic insomnia    Complication of anesthesia    shoulder surgery- not all the way asleep   COVID-19 02/04/2021   Depression    in past   Diverticulitis    Diverticulosis    Dizziness    Elevated liver enzymes    Family history of adverse reaction to anesthesia    sister- has problems with nausea and vomiting    Fatigue    Fatty liver    Generalized headache    migraines   GERD (gastroesophageal reflux disease)    History of chemotherapy    topical cream for h/o skin CA   History of kidney stones    History of neuropathy    FH Dr. Posey Pronto neurology    History of vertigo    Hx of migraines    Hyperlipidemia    Hypothyroidism    Insomnia    Irritable bowel syndrome    LLQ abdominal pain    Melanoma ([place]) 2013   Melanoma ([place])    x2 stg II melanoma   Neuropathy, peripheral    Peripheral neuropathy    Pneumonia    hx of x 2   PONV (postoperative nausea and vomiting)    Pre-diabetes    Retina disorder    left   SCC (squamous cell carcinoma)    skin    Stroke ([place])    ? TIA per Dr V per patient    Thyroid disease    Urine incontinence    Vitamin D deficiency    Weight gain      Patient Active Problem List   Diagnosis Date Noted   Anxiety and depression 01/12/2021   Insomnia 01/12/2021   Fatigue 01/12/2021   Varicose veins of leg with pain, bilateral 10/06/2020   Cerebrovascular accident (CVA) ([place]) 09/22/2020   Mid back pain 09/22/2020   Lymphedema 08/21/2020   CAD (coronary artery disease) 07/20/2020   Abnormal CT of the chest 05/07/2020   Dyspnea on  exertion 05/07/2020   Dysplastic nevus 02/28/2020   Diverticulosis 02/28/2020   Obesity (BMI 30-39.9) 02/28/2020   SCCA (squamous cell carcinoma) of skin 01/03/2020   Dry eyes 12/05/2019   Arthritis of hand 12/03/2019   Carpal tunnel syndrome of left wrist 12/03/2019   Scarring of lung 11/01/2019   Liver cyst 11/01/2019   Essential hypertension 11/01/2019   Carotid artery stenosis 08/13/2019   Cervical spinal stenosis 08/13/2019   Skin cancer 07/17/2019   Leg wound, left 05/14/2019   Overactive bladder 03/08/2019   Chronic cough 02/19/2019   Irritable bowel syndrome with diarrhea 02/19/2019   Depression, recurrent ([place]) 02/19/2019   Lumbosacral spondylosis without myelopathy 12/06/2018   Flushing 10/27/2018   Aortic atherosclerosis ([place]) 10/10/2018   Chest tightness 10/10/2018   Idiopathic neuropathy 09/13/2018   History of stroke 09/13/2018   History of squamous cell carcinoma of skin 09/13/2018   History of depression 09/13/2018   History of migraine  09/13/2018   Fatty liver 09/13/2018   GERD (gastroesophageal reflux disease) 09/13/2018   History of memory loss 09/13/2018   Prediabetes 09/13/2018   Hiatal hernia 09/13/2018   Retinal drusen of both eyes 12/14/2017   Asthma in remission 05/19/2017   Primary osteoarthritis of left knee 05/19/2017   Female stress incontinence 05/08/2017   Pain in pelvis 05/08/2017   Coronary artery calcification seen on CAT scan 04/09/2017   Carotid stenosis, asymptomatic, bilateral 04/09/2017   Hyperlipidemia 04/09/2017   MCI (mild cognitive impairment) 12/17/2015   Urinary frequency 08/09/2015   Hypothyroidism 08/09/2015   Syncope 08/08/2015   Posterior vitreous detachment of right eye 07/23/2015   Hereditary and idiopathic peripheral neuropathy 05/11/2015   Epiretinal membrane, left 04/12/2012   Nuclear cataract 04/12/2012   Age-related nuclear cataract, bilateral 04/12/2012   Breast pain in female 07/18/2011   Melanoma of back ([place]) 02/28/2011     Past Surgical History:  Procedure Laterality Date   BILATERAL SALPINGOOPHORECTOMY     ovarian cyst Dr. Ouida Sills and Dr. Alisia Ferrari 2008   BREAST SURGERY     in 2014 bx breast calcifications   BREAST SURGERY     augmentation with saline Dr. Hubbard Hartshorn 1995    CHOLECYSTECTOMY N/A 01/28/2015   Procedure: LAPAROSCOPIC CHOLECYSTECTOMY WITH INTRAOPERATIVE CHOLANGIOGRAM;  Surgeon: Autumn Messing III, MD;  Location: [place];  Service: General;  Laterality: N/A;   CLOSED MANIPULATION SHOULDER     rt   COLONOSCOPY WITH PROPOFOL N/A 11/20/2012   Procedure: COLONOSCOPY WITH PROPOFOL;  Surgeon: Garlan Fair, MD;  Location: [facility];  Service: Endoscopy;  Laterality: N/A;   colonscopy     May 2014   JOINT REPLACEMENT     RTK   KNEE ARTHROSCOPY     left knee miniscus tear repair     MELANOMA EXCISION WITH SENTINEL LYMPH NODE BIOPSY  03/18/11   Back, nodes neg   OOPHORECTOMY     ROTATOR CUFF REPAIR     left   SHOULDER SURGERY     right and left     SKIN CANCER EXCISION     multiple   TOTAL HIP ARTHROPLASTY     TOTAL KNEE ARTHROPLASTY     right 2010 and left in 2018    [place] Left 05/16/2017   Procedure: LEFT TOTAL KNEE ARTHROPLASTY;  Surgeon: Paralee Cancel, MD;  Location: [facility];  Service: Orthopedics;  Laterality: Left;  70 mins   TUBAL LIGATION     TUMOR REMOVAL     ovary  Prior to Admission medications   Medication Sig Start Date End Date Taking? Authorizing Provider  albuterol (PROAIR HFA) 108 (90 Base) MCG/ACT inhaler Inhale 1-2 puffs into the lungs every 6 (six) hours as needed for wheezing or shortness of breath. 11/06/20   McLean-Scocuzza, Nino Glow, MD  Ascorbic Acid (VITAMIN C) 1000 MG tablet Take 1,000 mg by mouth daily.    [provider]  aspirin 81 MG tablet TAKE 1 TABLET BY MOUTH EVERY DAY 04/23/18   [provider]  atorvastatin (LIPITOR) 20 MG tablet Take 1 tablet (20 mg total) by mouth daily. 11/06/20   McLean-Scocuzza, Nino Glow, MD  Calcium 600-200 MG-UNIT tablet Take 1 tablet daily by mouth.    [provider]  Cholecalciferol (VITAMIN D3) 2000 UNITS capsule Take 2,000 Units by mouth daily.    [provider]  clindamycin (CLEOCIN) 150 MG capsule Take 150 mg by mouth 4 (four) times daily. 01/25/21   [provider]  eszopiclone (LUNESTA) 1 MG TABS tablet Take 0.5-1 tablets (0.5-1 mg total) by mouth at bedtime as needed for sleep. Take immediately before bedtime 02/16/21   McLean-Scocuzza, Nino Glow, MD  FOLIC ACID PO Take by mouth daily.    [provider]  levothyroxine (SYNTHROID) 75 MCG tablet Take 1 tablet (75 mcg total) by mouth daily before breakfast. 11/06/20   McLean-Scocuzza, Nino Glow, MD  Magnesium 250 MG TABS Take 250 mg by mouth daily.    [provider]  olmesartan (BENICAR) 5 MG tablet Take 1 tablet (5 mg total) by mouth in the morning and at bedtime. 01/12/21   McLean-Scocuzza, Nino Glow, MD  Omega-3 Fatty Acids (FISH OIL) 1000 MG  CAPS Take 1,000 mg by mouth daily.     [provider]  omeprazole (PRILOSEC) 20 MG capsule Take 1 capsule (20 mg total) by mouth 2 (two) times daily before a meal. 11/06/20 03/06/21  McLean-Scocuzza, Nino Glow, MD  sertraline (ZOLOFT) 25 MG tablet Take 1 tablet (25 mg total) by mouth daily. 11/06/20   McLean-Scocuzza, Nino Glow, MD  vitamin B-12 (CYANOCOBALAMIN) 1000 MCG tablet Take 2,000 mcg by mouth daily. 2 tabs    [provider]  Zinc 50 MG CAPS Take 50 mg by mouth daily.    [provider]     Allergies Epinephrine, Penicillins, Lidocaine hcl, Other, and Valium [diazepam]   Family History  Problem Relation Age of Onset   Hypertension Mother    Stroke Mother    Hyperlipidemia Mother    Heart disease Father    Diabetes Father    Hypertension Father    Hyperlipidemia Father    Early death Father    Diabetes Sister    Heart disease Sister        heart murmur   Arthritis Sister    COPD Sister    Hyperlipidemia Sister    Hypertension Sister    Pancreatic cancer Maternal Uncle    Alzheimer's disease Paternal Aunt    Colon cancer Maternal Grandfather    Cancer Maternal Grandfather    Early death Maternal Grandfather    Alzheimer's disease Paternal Aunt    Alzheimer's disease Paternal Aunt    Colon polyps Sister        adenomous   Arthritis Sister    Heart disease Sister    Hyperlipidemia Sister    Hypertension Sister    Cancer Sister    Heart disease Daughter    Hyperlipidemia Daughter    Hypertension Daughter  Heart disease Son    Hyperlipidemia Son    Alcohol abuse Son    Depression Son    Diabetes Son    Stroke Maternal Grandmother    Early death Paternal Grandmother    Stroke Paternal Grandmother    Cancer Other        2 paternal uncles and 1 maternal uncle colon cancer   Alzheimer's disease Paternal Aunt     Social History Social History   Tobacco Use   Smoking status: Never   Smokeless tobacco: Never  Vaping Use   Vaping  Use: Never used  Substance Use Topics   Alcohol use: Yes    Alcohol/week: 1.0 standard drink    Types: 1 Cans of beer per week    Comment: once a week beer or wine   Drug use: No    Review of Systems  Constitutional:   No fever or chills.  ENT:   No sore throat. No rhinorrhea.  Left jaw pain as above Cardiovascular:   No chest pain or syncope. Respiratory:   No dyspnea or cough. Gastrointestinal:   Negative for abdominal pain, vomiting and diarrhea.  Musculoskeletal:   Negative for focal pain or swelling All other systems reviewed and are negative except as documented above in ROS and HPI.  ____________________________________________   PHYSICAL EXAM:  VITAL SIGNS: ED Triage Vitals [02/24/21 0942]  Enc Vitals Group     BP 109/66     Pulse Rate 73     Resp 18     Temp 98.2 F (36.8 C)     Temp Source Oral     SpO2 96 %     Weight 186 lb 15.2 oz (84.8 kg)     Height '5\' 6"'$  (1.676 m)     Head Circumference      Peak Flow      Pain Score 0     Pain Loc      Pain Edu?      Excl. in [place]?     Vital signs reviewed, nursing assessments reviewed.   Constitutional:   Alert and oriented. Non-toxic appearance. Eyes:   Conjunctivae are normal. EOMI. PERRL. ENT      Head:   Normocephalic and atraumatic.  No tenderness at the TMJ, no joint subluxation.  Temporal arteries symmetric pulsations and nontender.  There is crunchiness of the left TMJ with jaw range of motion.      Nose:   Normal      Mouth/Throat:   Unremarkable, moist mucosa, multiple teeth restorations.      Neck:   No meningismus. Full ROM. Hematological/Lymphatic/Immunilogical:   No cervical lymphadenopathy. Cardiovascular:   RRR. Symmetric bilateral radial and DP pulses.  No murmurs. Cap refill less than 2 seconds. Respiratory:   Normal respiratory effort without tachypnea/retractions. Breath sounds are clear and equal bilaterally. No wheezes/rales/rhonchi. Gastrointestinal:   Soft and nontender. Non distended.  There is no CVA tenderness.  No rebound, rigidity, or guarding. Genitourinary:   deferred Musculoskeletal:   Normal range of motion in all extremities. No joint effusions.  No lower extremity tenderness.  No edema. Neurologic:   Normal speech and language.  Cranial nerves III through XII intact Motor grossly intact. No drift.  Baseline gait using a cane No acute focal neurologic deficits are appreciated.  Skin:    Skin is warm, dry and intact. No rash noted.  No petechiae, purpura, or bullae.  ____________________________________________    LABS (pertinent positives/negatives) (all labs ordered are listed,  but only abnormal results are displayed) Labs Reviewed  BASIC METABOLIC PANEL - Abnormal; Notable for the following components:      Result Value   Glucose, Bld 141 (*)    GFR, Estimated 60 (*)    All other components within normal limits  URINALYSIS, COMPLETE (UACMP) WITH MICROSCOPIC - Abnormal; Notable for the following components:   Color, Urine STRAW (*)    APPearance HAZY (*)    Leukocytes,Ua MODERATE (*)    Non Squamous Epithelial PRESENT (*)    All other components within normal limits  CBC  SEDIMENTATION RATE   ____________________________________________   EKG    ____________________________________________    RADIOLOGY  MR BRAIN WO CONTRAST  Result Date: 02/24/2021 CLINICAL DATA:  Dizziness, persistent/recurrent, cardiac or vascular cause suspected EXAM: MRI HEAD WITHOUT CONTRAST TECHNIQUE: Multiplanar, multiecho pulse sequences of the brain and surrounding structures were obtained without intravenous contrast. COMPARISON:  MRI October 19, 2015 FINDINGS: Brain: Remote infarct in the right frontal lobe with associated encephalomalacia and gliosis. Progression of moderate to advanced additional scattered T2 hyperintensities within the white matter, likely related to chronic microvascular ischemic disease. Mild atrophy. No hydrocephalus. No mass lesion. No midline  shift. No extra-axial fluid collection. No acute hemorrhage. Vascular: Major arterial flow voids are maintained at the skull base. Skull and upper cervical spine: Normal marrow signal. Sinuses/Orbits: Clear sinuses.  Unremarkable orbits. Other: No mastoid effusions. IMPRESSION: 1. No evidence of acute intracranial abnormality. 2. Remote right frontal lobe infarct. 3. Progressive moderate to advanced chronic microvascular ischemic disease. Electronically Signed   By: Margaretha Sheffield MD   On: 02/24/2021 14:54    ____________________________________________   PROCEDURES Procedures  ____________________________________________  DIFFERENTIAL DIAGNOSIS   TMJ syndrome, temporal arteritis, acute ischemic stroke.  Doubt ACS dissection thoracic aneurysm or carotid occlusion  CLINICAL IMPRESSION / ASSESSMENT AND PLAN / ED COURSE  Medications ordered in the ED: Medications - No data to display  Pertinent labs & imaging results that were available during my care of the patient were reviewed by me and considered in my medical decision making (see chart for details).  LACHANDA PEABODY was evaluated in Emergency Department on 02/24/2021 for the symptoms described in the history of present illness. She was evaluated in the context of the global COVID-19 pandemic, which necessitated consideration that the patient might be at risk for infection with the SARS-CoV-2 virus that causes COVID-19. Institutional protocols and algorithms that pertain to the evaluation of patients at risk for COVID-19 are in a state of rapid change based on information released by regulatory bodies including the CDC and federal and state organizations. These policies and algorithms were followed during the patient's care in the ED.   Patient presents with left joint pain, most likely TMJ syndrome.  Due to her age and comorbidities and prior stroke, MRI obtained which is unremarkable.  Stable for discharge home.       ____________________________________________   FINAL CLINICAL IMPRESSION(S) / ED DIAGNOSES    Final diagnoses:  TMJ syndrome     ED Discharge Orders     None       Portions of this note were generated with dragon dictation software. Dictation errors may occur despite best attempts at proofreading.    Carrie Mew, MD 02/24/21 [phone]

## 2021-02-24 NOTE — ED Notes (Signed)
Pt went to MRI via stretcher

## 2021-02-24 NOTE — Discharge Instructions (Addendum)
Your MRI today does not show any acute issues such as a new stroke.  I suspect your jaw pain is due to irritation of the jaw joint.  This can be aggravated due to teeth grinding or clenching.  Take tylenol as needed for the new few days. If the pain persists, you can try a moldable mouthguard to wear at night or follow up with your dentist.

## 2021-02-26 ENCOUNTER — Telehealth: Payer: Self-pay | Admitting: Internal Medicine

## 2021-02-26 NOTE — Telephone Encounter (Signed)
Patient informed, Due to the high volume of calls and your symptoms we have to forward your call to our Triage Nurse to expedient your call. Please hold for the transfer.  Patient transferred to Access Nurse. Due to having blood pressure problems and unsure what to do next.No available openings in office or virtual.

## 2021-02-26 NOTE — Telephone Encounter (Signed)
Reason for Triage Call /in person:High BP. Patient is currently having TMJ px per ed note.  Symptoms:Moderate  Pain Scale:none, present - adequately treated, location, na, and level of pain (1-10, 10 severe), na  Onset:two days ago.  Duration:intermittent.  Medications:medication not used  Last seen for this problem:  Outcome: Was instructed by access nurse to be evaluated by PCP office within 3-4 days.  Attach Access Nurse Triage Note.

## 2021-02-27 NOTE — Telephone Encounter (Signed)
This patient needs to be scheduled with a provider sometime this week for her blood pressure.  If there are no available openings please let me know and I can look at my schedule.  Thanks.

## 2021-03-01 NOTE — Telephone Encounter (Signed)
She can be added to my schedule at 1130 on 03/04/2021.

## 2021-03-01 NOTE — Telephone Encounter (Signed)
Patient has been scheduled for 11:30 on 8/18.   BP readings listed below. 162/68 148/74 133/71

## 2021-03-01 NOTE — Telephone Encounter (Signed)
I don't see any openings as of right now with any of the providers in our office for the rest of this week.

## 2021-03-02 ENCOUNTER — Other Ambulatory Visit: Payer: Self-pay

## 2021-03-02 ENCOUNTER — Encounter (INDEPENDENT_AMBULATORY_CARE_PROVIDER_SITE_OTHER): Payer: Self-pay | Admitting: Vascular Surgery

## 2021-03-02 ENCOUNTER — Ambulatory Visit (INDEPENDENT_AMBULATORY_CARE_PROVIDER_SITE_OTHER): Payer: PPO | Admitting: Vascular Surgery

## 2021-03-02 VITALS — BP 123/73 | HR 75 | Resp 16 | Wt 186.8 lb

## 2021-03-02 DIAGNOSIS — E785 Hyperlipidemia, unspecified: Secondary | ICD-10-CM | POA: Diagnosis not present

## 2021-03-02 DIAGNOSIS — I83813 Varicose veins of bilateral lower extremities with pain: Secondary | ICD-10-CM | POA: Diagnosis not present

## 2021-03-02 DIAGNOSIS — I10 Essential (primary) hypertension: Secondary | ICD-10-CM | POA: Diagnosis not present

## 2021-03-02 NOTE — Assessment & Plan Note (Signed)
lipid control important in reducing the progression of atherosclerotic disease. Continue statin therapy  

## 2021-03-02 NOTE — Progress Notes (Signed)
MRN : [id number]  [name] is a 80 y.o. ([birth year]) female who presents with chief complaint of  Chief Complaint  Patient presents with   Follow-up    Foll 4 wk post laser    .  History of Present Illness: Patient returns today in follow up of her venous insufficiency after laser ablation of both great saphenous veins.  These were done in a staged fashion over the past several months.  Her legs are feeling better.  She does have 1 sore area on the left posterior medial calf just below the access site but this is improving.  Swelling is better.  Not having much pain.  Bruising and soreness resolved in the expected timeframe.  Both postprocedural duplex showed no DVT with ablation.  Current Outpatient Medications  Medication Sig Dispense Refill   albuterol (PROAIR HFA) 108 (90 Base) MCG/ACT inhaler Inhale 1-2 puffs into the lungs every 6 (six) hours as needed for wheezing or shortness of breath. 18 g 11   Ascorbic Acid (VITAMIN C) 1000 MG tablet Take 1,000 mg by mouth daily.     aspirin 81 MG tablet TAKE 1 TABLET BY MOUTH EVERY DAY     atorvastatin (LIPITOR) 20 MG tablet Take 1 tablet (20 mg total) by mouth daily. 90 tablet 3   Calcium 600-200 MG-UNIT tablet Take 1 tablet daily by mouth.     Cholecalciferol (VITAMIN D3) 2000 UNITS capsule Take 2,000 Units by mouth daily.     clindamycin (CLEOCIN) 150 MG capsule Take 150 mg by mouth 4 (four) times daily.     eszopiclone (LUNESTA) 1 MG TABS tablet Take 0.5-1 tablets (0.5-1 mg total) by mouth at bedtime as needed for sleep. Take immediately before bedtime 30 tablet 0   FOLIC ACID PO Take by mouth daily.     levothyroxine (SYNTHROID) 75 MCG tablet Take 1 tablet (75 mcg total) by mouth daily before breakfast. 90 tablet 3   Magnesium 250 MG TABS Take 250 mg by mouth daily.     olmesartan (BENICAR) 5 MG tablet Take 1 tablet (5 mg total) by mouth in the morning and at bedtime. 180 tablet 3   Omega-3 Fatty Acids (FISH OIL) 1000 MG CAPS  Take 1,000 mg by mouth daily.      omeprazole (PRILOSEC) 20 MG capsule Take 1 capsule (20 mg total) by mouth 2 (two) times daily before a meal. 60 capsule 11   sertraline (ZOLOFT) 25 MG tablet Take 1 tablet (25 mg total) by mouth daily. 90 tablet 3   vitamin B-12 (CYANOCOBALAMIN) 1000 MCG tablet Take 2,000 mcg by mouth daily. 2 tabs     Zinc 50 MG CAPS Take 50 mg by mouth daily.     No current facility-administered medications for this visit.    Past Medical History:  Diagnosis Date   Acid reflux    Allergy    Anemia    Anxiety    in past   Arthritis    all over   Asthma    Atypical moles    Bronchitis    Bronchospasm    reactive with perfume/cologne and smoke   CAD (coronary artery disease)    patient denies on preop of 10/24    Candida esophagitis (HCC)    Carotid artery calcification, bilateral 04/2016   Cataract    Cervical stenosis of spine    Chronic insomnia    Complication of anesthesia    shoulder surgery- not all the way asleep  COVID-19 02/04/2021   Depression    in past   Diverticulitis    Diverticulosis    Dizziness    Elevated liver enzymes    Family history of adverse reaction to anesthesia    sister- has problems with nausea and vomiting    Fatigue    Fatty liver    Generalized headache    migraines   GERD (gastroesophageal reflux disease)    History of chemotherapy    topical cream for h/o skin CA   History of kidney stones    History of neuropathy    FH Dr. Posey Pronto neurology   History of vertigo    Hx of migraines    Hyperlipidemia    Hypothyroidism    Insomnia    Irritable bowel syndrome    LLQ abdominal pain    Melanoma ([place]) 2013   Melanoma ([place])    x2 stg II melanoma   Neuropathy, peripheral    Peripheral neuropathy    Pneumonia    hx of x 2   PONV (postoperative nausea and vomiting)    Pre-diabetes    Retina disorder    left   SCC (squamous cell carcinoma)    skin    Stroke ([place])    ? TIA per Dr V per patient    Thyroid  disease    Urine incontinence    Vitamin D deficiency    Weight gain     Past Surgical History:  Procedure Laterality Date   BILATERAL SALPINGOOPHORECTOMY     ovarian cyst Dr. Ouida Sills and Dr. Alisia Ferrari 2008   BREAST SURGERY     in 2014 bx breast calcifications   BREAST SURGERY     augmentation with saline Dr. Hubbard [place]shorn 1995    CHOLECYSTECTOMY N/A 01/28/2015   Procedure: LAPAROSCOPIC CHOLECYSTECTOMY WITH INTRAOPERATIVE CHOLANGIOGRAM;  Surgeon: Autumn Messing III, MD;  Location: [facility];  Service: General;  Laterality: N/A;   CLOSED MANIPULATION SHOULDER     rt   COLONOSCOPY WITH PROPOFOL N/A 11/20/2012   Procedure: COLONOSCOPY WITH PROPOFOL;  Surgeon: Garlan Fair, MD;  Location: [facility];  Service: Endoscopy;  Laterality: N/A;   colonscopy     May 2014   JOINT REPLACEMENT     RTK   KNEE ARTHROSCOPY     left knee miniscus tear repair     MELANOMA EXCISION WITH SENTINEL LYMPH NODE BIOPSY  03/18/11   Back, nodes neg   OOPHORECTOMY     ROTATOR CUFF REPAIR     left   SHOULDER SURGERY     right and left    SKIN CANCER EXCISION     multiple   TOTAL HIP ARTHROPLASTY     TOTAL KNEE ARTHROPLASTY     right 2010 and left in 2018    [place] Left 05/16/2017   Procedure: LEFT TOTAL KNEE ARTHROPLASTY;  Surgeon: Paralee Cancel, MD;  Location: [facility];  Service: Orthopedics;  Laterality: Left;  70 mins   TUBAL LIGATION     TUMOR REMOVAL     ovary     Social History   Tobacco Use   Smoking status: Never   Smokeless tobacco: Never  Vaping Use   Vaping Use: Never used  Substance Use Topics   Alcohol use: Yes    Alcohol/week: 1.0 standard drink    Types: 1 Cans of beer per week    Comment: once a week beer or wine   Drug use: No  Family History  Problem Relation Age of Onset   Hypertension Mother    Stroke Mother    Hyperlipidemia Mother    Heart disease Father    Diabetes Father    Hypertension Father    Hyperlipidemia Father    Early death Father     Diabetes Sister    Heart disease Sister        heart murmur   Arthritis Sister    COPD Sister    Hyperlipidemia Sister    Hypertension Sister    Pancreatic cancer Maternal Uncle    Alzheimer's disease Paternal Aunt    Colon cancer Maternal Grandfather    Cancer Maternal Grandfather    Early death Maternal Grandfather    Alzheimer's disease Paternal Aunt    Alzheimer's disease Paternal Aunt    Colon polyps Sister        adenomous   Arthritis Sister    Heart disease Sister    Hyperlipidemia Sister    Hypertension Sister    Cancer Sister    Heart disease Daughter    Hyperlipidemia Daughter    Hypertension Daughter    Heart disease Son    Hyperlipidemia Son    Alcohol abuse Son    Depression Son    Diabetes Son    Stroke Maternal Grandmother    Early death Paternal Grandmother    Stroke Paternal Grandmother    Cancer Other        2 paternal uncles and 1 maternal uncle colon cancer   Alzheimer's disease Paternal Aunt      Allergies  Allergen Reactions   Epinephrine Other (See Comments) and Anaphylaxis    Heart palpitations   Penicillins Swelling and Other (See Comments)    Lip swelling Has patient had a PCN reaction causing immediate rash, facial/tongue/throat swelling, SOB or lightheadedness with hypotension: Yes Has patient had a PCN reaction causing severe rash involving mucus membranes or skin necrosis: No Has patient had a PCN reaction that required hospitalization No Has patient had a PCN reaction occurring within the last 10 years: No If all of the above answers are "NO", then may proceed with Cephalosporin use.   Lidocaine Hcl Other (See Comments)   Other     Black pepper- caused bronchial tubes to start closing    Valium [Diazepam] Other (See Comments)    Other reaction(s): Other (See Comments) Overly sensitive  sedation     REVIEW OF SYSTEMS (Negative unless checked)  Constitutional: '[]'$ Weight loss  '[]'$ Fever  '[]'$ Chills Cardiac: '[]'$ Chest pain    '[]'$ Chest pressure   '[]'$ Palpitations   '[]'$ Shortness of breath when laying flat   '[]'$ Shortness of breath at rest   '[]'$ Shortness of breath with exertion. Vascular:  '[]'$ Pain in legs with walking   '[]'$ Pain in legs at rest   '[]'$ Pain in legs when laying flat   '[]'$ Claudication   '[]'$ Pain in feet when walking  '[]'$ Pain in feet at rest  '[]'$ Pain in feet when laying flat   '[]'$ History of DVT   '[]'$ Phlebitis   '[]'$ Swelling in legs   '[]'$ Varicose veins   '[]'$ Non-healing ulcers Pulmonary:   '[]'$ Uses home oxygen   '[]'$ Productive cough   '[]'$ Hemoptysis   '[]'$ Wheeze  '[]'$ COPD   '[]'$ Asthma Neurologic:  '[]'$ Dizziness  '[]'$ Blackouts   '[]'$ Seizures   '[]'$ History of stroke   '[]'$ History of TIA  '[]'$ Aphasia   '[]'$ Temporary blindness   '[]'$ Dysphagia   '[]'$ Weakness or numbness in arms   '[]'$ Weakness or numbness in legs Musculoskeletal:  '[x]'$ Arthritis   '[]'$ Joint swelling   [  x]Joint pain   '[]'$ Low back pain Hematologic:  '[]'$ Easy bruising  '[]'$ Easy bleeding   '[]'$ Hypercoagulable state   '[x]'$ Anemic   Gastrointestinal:  '[]'$ Blood in stool   '[]'$ Vomiting blood  '[x]'$ Gastroesophageal reflux/heartburn   '[]'$ Abdominal pain Genitourinary:  '[]'$ Chronic kidney disease   '[]'$ Difficult urination  '[]'$ Frequent urination  '[]'$ Burning with urination   '[]'$ Hematuria Skin:  '[]'$ Rashes   '[]'$ Ulcers   '[]'$ Wounds Psychological:  '[]'$ History of anxiety   '[]'$  History of major depression.  Physical Examination  BP 123/73 (BP Location: Left Arm)   Pulse 75   Resp 16   Wt 186 lb 12.8 oz (84.7 kg)   BMI 30.15 kg/m  Gen:  WD/WN, NAD.  Appears younger than stated age Head: [place]/AT, No temporalis wasting. Ear/Nose/Throat: Hearing grossly intact, nares w/o erythema or drainage Eyes: Conjunctiva clear. Sclera non-icteric Neck: Supple.  Trachea midline Pulmonary:  Good air movement, no use of accessory muscles.  Cardiac: RRR, no JVD Vascular:  Vessel Right Left  Radial Palpable Palpable                          PT Palpable Palpable  DP Palpable Palpable   Gastrointestinal: soft, non-tender/non-distended. No guarding/reflex.   Musculoskeletal: M/S 5/5 throughout.  No deformity or atrophy.  No significant lower extremity edema. Neurologic: Sensation grossly intact in extremities.  Symmetrical.  Speech is fluent.  Psychiatric: Judgment intact, Mood & affect appropriate for pt's clinical situation. Dermatologic: No rashes or ulcers noted.  No cellulitis or open wounds.      Labs Recent Results (from the past 2160 hour(s))  Basic metabolic panel     Status: Abnormal   Collection Time: 12/20/20 10:12 AM  Result Value Ref Range   Sodium 140 135 - 145 mmol/L   Potassium 4.7 3.5 - 5.1 mmol/L   Chloride 107 98 - 111 mmol/L   CO2 22 22 - 32 mmol/L   Glucose, Bld 128 (H) 70 - 99 mg/dL    Comment: Glucose reference range applies only to samples taken after fasting for at least 8 hours.   BUN 18 8 - 23 mg/dL   Creatinine, Ser 0.87 0.44 - 1.00 mg/dL   Calcium 9.4 8.9 - 10.3 mg/dL   GFR, Estimated >60 >60 mL/min    Comment: (NOTE) Calculated using the CKD-EPI Creatinine Equation (2021)    Anion gap 11 5 - 15    Comment: Performed at [facility], [address] [place], [place] [address]  CBC     Status: None   Collection Time: 12/20/20 10:12 AM  Result Value Ref Range   WBC 5.3 4.0 - 10.5 K/uL   RBC 4.52 3.87 - 5.11 MIL/uL   Hemoglobin 13.4 12.0 - 15.0 g/dL   HCT 40.7 36.0 - 46.0 %   MCV 90.0 80.0 - 100.0 fL   MCH 29.6 26.0 - 34.0 pg   MCHC 32.9 30.0 - 36.0 g/dL   RDW 13.2 11.5 - 15.5 %   Platelets 196 150 - 400 K/uL   nRBC 0.0 0.0 - 0.2 %    Comment: Performed at [facility], [place], [place] [address]  Troponin I (High Sensitivity)     Status: None   Collection Time: 12/20/20 10:12 AM  Result Value Ref Range   Troponin I (High Sensitivity) 3 <18 ng/L    Comment: (NOTE) Elevated high sensitivity troponin I (hsTnI) values and significant  changes across serial measurements may suggest ACS but many other  chronic and acute conditions are known to elevate hsTnI  results.  Refer to the "Links" section for chest pain algorithms and additional  guidance. Performed at [facility], [address] [place], [place] [address]   TSH     Status: None   Collection Time: 12/22/20  2:05 PM  Result Value Ref Range   TSH 1.79 0.35 - 4.50 uIU/mL  HM MAMMOGRAPHY     Status: None   Collection Time: 02/12/21 12:00 AM  Result Value Ref Range   HM Mammogram 0-4 Bi-Rad 0-4 Bi-Rad, Self Reported Normal    Comment: [facility]  Urinalysis, Routine w reflex microscopic     Status: None   Collection Time: 02/16/21  3:24 PM  Result Value Ref Range   Color, Urine YELLOW YELLOW   APPearance CLEAR CLEAR   Specific Gravity, Urine 1.009 1.001 - 1.035   pH < OR = 5.0 5.0 - 8.0   Glucose, UA NEGATIVE NEGATIVE   Bilirubin Urine NEGATIVE NEGATIVE   Ketones, ur NEGATIVE NEGATIVE   Hgb urine dipstick NEGATIVE NEGATIVE   Protein, ur NEGATIVE NEGATIVE   Nitrite NEGATIVE NEGATIVE   Leukocytes,Ua NEGATIVE NEGATIVE  Urine Culture     Status: None   Collection Time: 02/16/21  3:24 PM   Specimen: Urine  Result Value Ref Range   MICRO NUMBER: [id number]    SPECIMEN QUALITY: Adequate    Sample Source NOT GIVEN    STATUS: FINAL    ISOLATE 1:      Mixed genital flora isolated. These superficial bacteria are not indicative of a urinary tract infection. No further organism identification is warranted on this specimen. If clinically indicated, recollect clean-catch, mid-stream urine and transfer  immediately to Urine Culture Transport Tube.   D-Dimer, Quantitative     Status: Abnormal   Collection Time: 02/16/21  3:39 PM  Result Value Ref Range   D-Dimer, Quant 1.45 (H) <0.50 mcg/mL FEU    Comment: . The D-Dimer test is used frequently to exclude an acute PE or DVT. In patients with a low to moderate clinical risk assessment and a D-Dimer result <0.50 mcg/mL FEU, the likelihood of a PE or DVT is very low. However, a thromboembolic event should not be excluded  solely on the basis of the D-Dimer level. Increased levels of D-Dimer are associated with a PE, DVT, DIC, malignancies, inflammation, sepsis, surgery, trauma, pregnancy, and advancing patient age. [Jama 2006 11:295(2):199-207] . For additional information, please refer to: http://education.questdiagnostics.com/faq/FAQ149 (This link is being provided for informational/ educational purposes only) .   I-STAT creatinine     Status: None   Collection Time: 02/17/21 11:09 AM  Result Value Ref Range   Creatinine, Ser 0.90 0.44 - 1.00 mg/dL  Basic metabolic panel     Status: Abnormal   Collection Time: 02/24/21  9:45 AM  Result Value Ref Range   Sodium 139 135 - 145 mmol/L   Potassium 4.2 3.5 - 5.1 mmol/L   Chloride 105 98 - 111 mmol/L   CO2 24 22 - 32 mmol/L   Glucose, Bld 141 (H) 70 - 99 mg/dL    Comment: Glucose reference range applies only to samples taken after fasting for at least 8 hours.   BUN 14 8 - 23 mg/dL   Creatinine, Ser 0.96 0.44 - 1.00 mg/dL   Calcium 9.1 8.9 - 10.3 mg/dL   GFR, Estimated 60 (L) >60 mL/min    Comment: (NOTE) Calculated using the CKD-EPI Creatinine Equation (2021)  Anion gap 10 5 - 15    Comment: Performed at [facility], [address] [place], [place] [address]  CBC     Status: None   Collection Time: 02/24/21  9:45 AM  Result Value Ref Range   WBC 5.3 4.0 - 10.5 K/uL   RBC 4.52 3.87 - 5.11 MIL/uL   Hemoglobin 13.7 12.0 - 15.0 g/dL   HCT 41.2 36.0 - 46.0 %   MCV 91.2 80.0 - 100.0 fL   MCH 30.3 26.0 - 34.0 pg   MCHC 33.3 30.0 - 36.0 g/dL   RDW 13.2 11.5 - 15.5 %   Platelets 176 150 - 400 K/uL   nRBC 0.0 0.0 - 0.2 %    Comment: Performed at [facility], [address] [place], [place] [address]  Sedimentation rate     Status: None   Collection Time: 02/24/21  9:45 AM  Result Value Ref Range   Sed Rate 3 0 - 30 mm/hr    Comment: Performed at [facility], [address] [place], [place] [address]   Urinalysis, Complete w Microscopic Urine, Clean Catch     Status: Abnormal   Collection Time: 02/24/21  1:12 PM  Result Value Ref Range   Color, Urine STRAW (A) YELLOW   APPearance HAZY (A) CLEAR   Specific Gravity, Urine 1.006 1.005 - 1.030   pH 5.0 5.0 - 8.0   Glucose, UA NEGATIVE NEGATIVE mg/dL   Hgb urine dipstick NEGATIVE NEGATIVE   Bilirubin Urine NEGATIVE NEGATIVE   Ketones, ur NEGATIVE NEGATIVE mg/dL   Protein, ur NEGATIVE NEGATIVE mg/dL   Nitrite NEGATIVE NEGATIVE   Leukocytes,Ua MODERATE (A) NEGATIVE   RBC / HPF 0-5 0 - 5 RBC/hpf   WBC, UA 11-20 0 - 5 WBC/hpf   Bacteria, UA NONE SEEN NONE SEEN   Squamous Epithelial / LPF 0-5 0 - 5   Non Squamous Epithelial PRESENT (A) NONE SEEN    Comment: Performed at [facility], [address], [place], [place] [address]    Radiology DG Chest 2 View  Result Date: 02/17/2021 CLINICAL DATA:  Fatigue and dizziness.  Prior history of COVID. EXAM: CHEST - 2 VIEW COMPARISON:  12/20/2020. FINDINGS: Mediastinum and hilar structures normal. No focal infiltrate. No pleural effusion or pneumothorax. Heart size normal. Surgical clips right upper quadrant. No acute bony abnormality. Degenerative changes thoracic spine. Postsurgical changes right shoulder. IMPRESSION: No acute cardiopulmonary disease. Electronically Signed   By: Marcello Moores  Register   On: 02/17/2021 07:02   CT Angio Chest W/Cm &/Or Wo Cm  Result Date: 02/17/2021 CLINICAL DATA:  Positive D-dimer and mid back pain. COVID 3 weeks ago. EXAM: CT ANGIOGRAPHY CHEST WITH CONTRAST TECHNIQUE: Multidetector CT imaging of the chest was performed using the standard protocol during bolus administration of intravenous contrast. Multiplanar CT image reconstructions and MIPs were obtained to evaluate the vascular anatomy. CONTRAST:  47m OMNIPAQUE IOHEXOL 350 MG/ML SOLN COMPARISON:  01/21/2019 FINDINGS: Cardiovascular: Satisfactory opacification of the pulmonary arteries to the segmental level. No  evidence of pulmonary embolism. Normal heart size. No pericardial effusion. Atheromatous calcification of the aorta and coronaries. Mediastinum/Nodes: Small sliding hiatal hernia. No adenopathy or mass. Left axillary dissection. Lungs/Pleura: There is no edema, consolidation, effusion, or pneumothorax. Upper Abdomen: Cholecystectomy.  Hepatic cystic densities. Musculoskeletal: Especially lower thoracic degenerative disc narrowing endplate ridging. No acute or aggressive finding. Other: Breast implants with symmetric inflation. Review of the MIP images confirms the above findings. IMPRESSION: 1. Negative for  pulmonary embolism or other acute finding. 2. Atherosclerosis, including the coronary arteries. Electronically Signed   By: Monte Fantasia M.D.   On: 02/17/2021 11:46   MR BRAIN WO CONTRAST  Result Date: 02/24/2021 CLINICAL DATA:  Dizziness, persistent/recurrent, cardiac or vascular cause suspected EXAM: MRI HEAD WITHOUT CONTRAST TECHNIQUE: Multiplanar, multiecho pulse sequences of the brain and surrounding structures were obtained without intravenous contrast. COMPARISON:  MRI October 19, 2015 FINDINGS: Brain: Remote infarct in the right frontal lobe with associated encephalomalacia and gliosis. Progression of moderate to advanced additional scattered T2 hyperintensities within the white matter, likely related to chronic microvascular ischemic disease. Mild atrophy. No hydrocephalus. No mass lesion. No midline shift. No extra-axial fluid collection. No acute hemorrhage. Vascular: Major arterial flow voids are maintained at the skull base. Skull and upper cervical spine: Normal marrow signal. Sinuses/Orbits: Clear sinuses.  Unremarkable orbits. Other: No mastoid effusions. IMPRESSION: 1. No evidence of acute intracranial abnormality. 2. Remote right frontal lobe infarct. 3. Progressive moderate to advanced chronic microvascular ischemic disease. Electronically Signed   By: Margaretha [place] MD   On: 02/24/2021  14:54   VAS [name] LASER ABLATION OF SUPERFICIAL VEIN  Result Date: 02/09/2021  Lower Venous Study Patient Name:  [name]  Date of Exam:   02/02/2021 Medical Rec #: [id number]       Accession #:    [id number] Date of Birth: 1941-01-10       Patient Gender: F Patient Age:   58Y Exam Location:  [facility] Procedure:      VAS [name] LASER ABLATION OF SUPERFICIAL VEIN Referring Phys: [place] --------------------------------------------------------------------------------  Indications: Post-op.  Performing Technologist: Charlane Ferretti RT (R)(VS)  Examination Guidelines: A complete evaluation includes B-mode imaging, spectral Doppler, color Doppler, and power Doppler as needed of all accessible portions of each vessel. Bilateral testing is considered an integral part of a complete examination. Limited examinations for reoccurring indications may be performed as noted.  +---------+---------------+---------+-----------+----------+--------------+ RIGHT    CompressibilityPhasicitySpontaneityPropertiesThrombus Aging +---------+---------------+---------+-----------+----------+--------------+ CFV      Full                                                        +---------+---------------+---------+-----------+----------+--------------+ SFJ      Full                                                        +---------+---------------+---------+-----------+----------+--------------+ FV Prox  Full                                                        +---------+---------------+---------+-----------+----------+--------------+ FV Mid   Full                                                        +---------+---------------+---------+-----------+----------+--------------+ FV DistalFull                                                        +---------+---------------+---------+-----------+----------+--------------+  POP      Full                                                         +---------+---------------+---------+-----------+----------+--------------+ GSV      None                                                        +---------+---------------+---------+-----------+----------+--------------+ SSV      Full                                                        +---------+---------------+---------+-----------+----------+--------------+  Summary: Right: - There is no evidence of deep vein thrombosis in the lower extremity. Right GSV is non compressable from calf to approximaely 3.19cm from SFJ. Successful vein closure.  *See table(s) above for measurements and observations. Electronically signed by Leotis Pain MD on 02/09/2021 at 5:03:34 PM.    Final     Assessment/Plan  Varicose veins of leg with pain, bilateral The patient is doing well after laser ablation of both great saphenous veins in a staged fashion.  Not a lot of significant residual varicosities requiring treatment.  At this point, she will follow-up as needed.  Essential hypertension blood pressure control important in reducing the progression of atherosclerotic disease. On appropriate oral medications.   Hyperlipidemia lipid control important in reducing the progression of atherosclerotic disease. Continue statin therapy    Leotis Pain, MD  03/02/2021 12:08 PM    This note was created with Dragon medical transcription system.  Any errors from dictation are purely unintentional

## 2021-03-02 NOTE — Assessment & Plan Note (Signed)
blood pressure control important in reducing the progression of atherosclerotic disease. On appropriate oral medications.  

## 2021-03-02 NOTE — Assessment & Plan Note (Signed)
The patient is doing well after laser ablation of both great saphenous veins in a staged fashion.  Not a lot of significant residual varicosities requiring treatment.  At this point, she will follow-up as needed.

## 2021-03-04 ENCOUNTER — Encounter: Payer: Self-pay | Admitting: Family Medicine

## 2021-03-04 ENCOUNTER — Ambulatory Visit (INDEPENDENT_AMBULATORY_CARE_PROVIDER_SITE_OTHER): Payer: PPO | Admitting: Family Medicine

## 2021-03-04 ENCOUNTER — Other Ambulatory Visit: Payer: Self-pay | Admitting: Family Medicine

## 2021-03-04 ENCOUNTER — Ambulatory Visit
Admission: RE | Admit: 2021-03-04 | Discharge: 2021-03-04 | Disposition: A | Discharge status: Home / Self Care | Payer: PPO | Source: Ambulatory Visit | Attending: Internal Medicine | Admitting: Internal Medicine

## 2021-03-04 ENCOUNTER — Other Ambulatory Visit: Payer: Self-pay

## 2021-03-04 DIAGNOSIS — E785 Hyperlipidemia, unspecified: Secondary | ICD-10-CM | POA: Diagnosis not present

## 2021-03-04 DIAGNOSIS — E782 Mixed hyperlipidemia: Secondary | ICD-10-CM | POA: Diagnosis not present

## 2021-03-04 DIAGNOSIS — Z8673 Personal history of transient ischemic attack (TIA), and cerebral infarction without residual deficits: Secondary | ICD-10-CM | POA: Diagnosis not present

## 2021-03-04 DIAGNOSIS — I10 Essential (primary) hypertension: Secondary | ICD-10-CM

## 2021-03-04 DIAGNOSIS — I6523 Occlusion and stenosis of bilateral carotid arteries: Secondary | ICD-10-CM | POA: Diagnosis not present

## 2021-03-04 DIAGNOSIS — M26609 Unspecified temporomandibular joint disorder, unspecified side: Secondary | ICD-10-CM | POA: Insufficient documentation

## 2021-03-04 MED ORDER — OLMESARTAN MEDOXOMIL 5 MG PO TABS: 10.0000 mg | ORAL_TABLET | Freq: Two times a day (BID) | ORAL | 1 refills | Status: DC

## 2021-03-04 NOTE — Assessment & Plan Note (Signed)
Suspect mild TMJ dysfunction.  Advised to follow-up with her dentist to consider a splint if not improving.

## 2021-03-04 NOTE — Patient Instructions (Signed)
Nice to see you. We are going to increase your olmesartan to 10 mg twice daily.  You will have lab work again in 1 week to evaluate your kidney function and potassium levels with this increase. If you start to get lightheaded please let us know. Please stop the magnesium, potassium, vitamin C, and zinc supplements. If your TMJ symptoms are not improving please see your dentist.

## 2021-03-04 NOTE — Assessment & Plan Note (Signed)
Discussed continued risk factor management.  We will work on blood pressure control.  She will continue her atorvastatin 20 mg once daily.  Continue aspirin 81 mg daily.

## 2021-03-04 NOTE — Assessment & Plan Note (Signed)
Check lipid panel with upcoming labs.

## 2021-03-04 NOTE — Progress Notes (Signed)
Tommi Rumps, MD Phone: [phone]  [name] is a 80 y.o. female who presents today for follow-up.  Hypertension: Patient notes her blood pressure has been up for quite some time.  It is ranging typically from the 140s to 160s over 80s.  She has been on olmesartan 5 mg twice daily.  She notes no change to chronic intermittent chest discomfort which she follows with cardiology for.  She notes her chronic shortness of breath has improved.  She notes no change to chronic edema.  She notes she was on amlodipine before though her cardiologist took her off of this.  She has compared her blood pressure machine to our readings and they are accurate.  She does not typically rest prior to checking her blood pressure.  History of stroke: This occurred in 2017.  She has chronic balance difficulty and she uses a cane.  She does take her Lipitor and and aspirin.  TMJ: Patient was having bad jaw discomfort.  She was seen in the emergency department and was felt to be related to TMJ.  She did have a crown placed on the right side and notes her TMJ was on the left side.  Social History   Tobacco Use  Smoking Status Never  Smokeless Tobacco Never    Current Outpatient Medications on File Prior to Visit  Medication Sig Dispense Refill   albuterol (PROAIR HFA) 108 (90 Base) MCG/ACT inhaler Inhale 1-2 puffs into the lungs every 6 (six) hours as needed for wheezing or shortness of breath. 18 g 11   aspirin 81 MG tablet TAKE 1 TABLET BY MOUTH EVERY DAY     atorvastatin (LIPITOR) 20 MG tablet Take 1 tablet (20 mg total) by mouth daily. 90 tablet 3   Calcium 600-200 MG-UNIT tablet Take 1 tablet daily by mouth.     Cholecalciferol (VITAMIN D3) 2000 UNITS capsule Take 2,000 Units by mouth daily.     clindamycin (CLEOCIN) 150 MG capsule Take 150 mg by mouth 4 (four) times daily.     eszopiclone (LUNESTA) 1 MG TABS tablet Take 0.5-1 tablets (0.5-1 mg total) by mouth at bedtime as needed for sleep. Take  immediately before bedtime 30 tablet 0   FOLIC ACID PO Take by mouth daily.     levothyroxine (SYNTHROID) 75 MCG tablet Take 1 tablet (75 mcg total) by mouth daily before breakfast. 90 tablet 3   Omega-3 Fatty Acids (FISH OIL) 1000 MG CAPS Take 1,000 mg by mouth daily.      omeprazole (PRILOSEC) 20 MG capsule Take 1 capsule (20 mg total) by mouth 2 (two) times daily before a meal. 60 capsule 11   sertraline (ZOLOFT) 25 MG tablet Take 1 tablet (25 mg total) by mouth daily. 90 tablet 3   vitamin B-12 (CYANOCOBALAMIN) 1000 MCG tablet Take 2,000 mcg by mouth daily. 2 tabs     No current facility-administered medications on file prior to visit.     ROS see history of present illness  Objective  Physical Exam Vitals:   03/04/21 1138  BP: 120/80  Pulse: 67  Temp: 97.6 F (36.4 C)  SpO2: 96%    BP Readings from Last 3 Encounters:  03/04/21 120/80  03/02/21 123/73  02/24/21 (!) 156/72   Wt Readings from Last 3 Encounters:  03/04/21 186 lb 6.4 oz (84.6 kg)  03/02/21 186 lb 12.8 oz (84.7 kg)  02/24/21 186 lb 15.2 oz (84.8 kg)    Physical Exam Constitutional:      General:  She is not in acute distress.    Appearance: She is not diaphoretic.  HENT:     Head:     Comments: Slight tenderness of the TMJ joints on opening and closing her mouth, there is some grinding in bilateral TMJ joints    Right Ear: Tympanic membrane normal.     Left Ear: Tympanic membrane normal.  Cardiovascular:     Rate and Rhythm: Normal rate and regular rhythm.     Heart sounds: Normal heart sounds.  Pulmonary:     Effort: Pulmonary effort is normal.     Breath sounds: Normal breath sounds.  Skin:    General: Skin is warm and dry.  Neurological:     Mental Status: She is alert.     Assessment/Plan: Please see individual problem list.  Problem List Items Addressed This Visit     Essential hypertension    Uncontrolled.  We will increase her olmesartan to 10 mg twice daily.  She will return in 1  week before blood work.  She will return in 1 month for follow-up on her blood pressure.  She will monitor for lightheadedness.      Relevant Medications   olmesartan (BENICAR) 5 MG tablet   Other Relevant Orders   Basic Metabolic Panel (BMET)   History of stroke    Discussed continued risk factor management.  We will work on blood pressure control.  She will continue her atorvastatin 20 mg once daily.  Continue aspirin 81 mg daily.      Hyperlipidemia    Check lipid panel with upcoming labs.      Relevant Medications   olmesartan (BENICAR) 5 MG tablet   Other Relevant Orders   Lipid panel   TMJ dysfunction    Suspect mild TMJ dysfunction.  Advised to follow-up with her dentist to consider a splint if not improving.        Return in about 1 week (around 03/11/2021) for Labs, 1 month BP check with Terrance Usery.  This visit occurred during the SARS-CoV-2 public health emergency.  Safety protocols were in place, including screening questions prior to the visit, additional usage of staff PPE, and extensive cleaning of exam room while observing appropriate contact time as indicated for disinfecting solutions.    Tommi Rumps, MD [place]

## 2021-03-04 NOTE — Assessment & Plan Note (Signed)
Uncontrolled.  We will increase her olmesartan to 10 mg twice daily.  She will return in 1 week before blood work.  She will return in 1 month for follow-up on her blood pressure.  She will monitor for lightheadedness.

## 2021-03-09 ENCOUNTER — Telehealth: Payer: Self-pay | Admitting: Cardiovascular Disease

## 2021-03-09 NOTE — Telephone Encounter (Signed)
Patient calling  Patient had a carotid completed at the hospital  Would like to know if Dr Rockey Situ could review and let her know the percentages in each carotid  Please call to discuss

## 2021-03-09 NOTE — Telephone Encounter (Signed)
Patient had carotid ordered by her PCP that was done. She had this done and wanted Dr. Rockey Situ to review. She feels that appointment is not needed unless he feels there is a need. Reviewed results that her primary care provider noted no intervention needed at this time. Patient does not want to come in if not needed. Based on results I advised it does not appear this is needed but I will send over to provider and if he should have any recommendations we can call her back. If no need then we will not call. She was agreeable with this plan with no further questions at this time.

## 2021-03-10 NOTE — Telephone Encounter (Signed)
Called pt following up with her carotid u/d results order from PCP at [facility], Dr. Rockey Situ reviewed and advised  It would appear there was no significant stenosis bilaterally  My estimation of the numbers there is  1-39% stenosis bilaterally  This is the lowest level of stenosis  Thx  TGollan   [name] very pleased with Dr. Donivan Scull advise, no appt needed at this time, pt will f/u within a year with Dr. Rockey Situ.

## 2021-03-10 NOTE — Telephone Encounter (Signed)
I called the pt and made her aware of the NP's instructions. 

## 2021-03-11 ENCOUNTER — Other Ambulatory Visit: Payer: Self-pay

## 2021-03-11 ENCOUNTER — Other Ambulatory Visit (INDEPENDENT_AMBULATORY_CARE_PROVIDER_SITE_OTHER): Payer: PPO

## 2021-03-11 DIAGNOSIS — E785 Hyperlipidemia, unspecified: Secondary | ICD-10-CM

## 2021-03-11 DIAGNOSIS — I10 Essential (primary) hypertension: Secondary | ICD-10-CM | POA: Diagnosis not present

## 2021-03-11 LAB — LIPID PANEL
Cholesterol: 136 mg/dL (ref 0–200)
HDL: 66.2 mg/dL (ref >39.00)
LDL Cholesterol: 51 mg/dL (ref 0–99)
NonHDL: 70.06
Total CHOL/HDL Ratio: 2
Triglycerides: 94 mg/dL (ref 0.0–149.0)
VLDL: 18.8 mg/dL (ref 0.0–40.0)

## 2021-03-11 LAB — BASIC METABOLIC PANEL
BUN: 18 mg/dL (ref 6–23)
CO2: 24 mEq/L (ref 19–32)
Calcium: 9.7 mg/dL (ref 8.4–10.5)
Chloride: 105 mEq/L (ref 96–112)
Creatinine, Ser: 1.13 mg/dL (ref 0.40–1.20)
GFR: 46.03 mL/min — ABNORMAL LOW (ref >60.00)
Glucose, Bld: 117 mg/dL — ABNORMAL HIGH (ref 70–99)
Potassium: 4.4 mEq/L (ref 3.5–5.1)
Sodium: 138 mEq/L (ref 135–145)

## 2021-03-13 ENCOUNTER — Other Ambulatory Visit: Payer: Self-pay | Admitting: Family Medicine

## 2021-03-13 DIAGNOSIS — I10 Essential (primary) hypertension: Secondary | ICD-10-CM

## 2021-03-13 DIAGNOSIS — R7309 Other abnormal glucose: Secondary | ICD-10-CM

## 2021-03-30 ENCOUNTER — Other Ambulatory Visit: Payer: Self-pay

## 2021-03-30 ENCOUNTER — Other Ambulatory Visit (INDEPENDENT_AMBULATORY_CARE_PROVIDER_SITE_OTHER): Payer: PPO

## 2021-03-30 DIAGNOSIS — I10 Essential (primary) hypertension: Secondary | ICD-10-CM | POA: Diagnosis not present

## 2021-03-30 DIAGNOSIS — R7309 Other abnormal glucose: Secondary | ICD-10-CM | POA: Diagnosis not present

## 2021-03-30 LAB — BASIC METABOLIC PANEL
BUN: 21 mg/dL (ref 6–23)
CO2: 25 mEq/L (ref 19–32)
Calcium: 9.3 mg/dL (ref 8.4–10.5)
Chloride: 106 mEq/L (ref 96–112)
Creatinine, Ser: 0.97 mg/dL (ref 0.40–1.20)
GFR: 55.27 mL/min — ABNORMAL LOW (ref >60.00)
Glucose, Bld: 108 mg/dL — ABNORMAL HIGH (ref 70–99)
Potassium: 4.4 mEq/L (ref 3.5–5.1)
Sodium: 140 mEq/L (ref 135–145)

## 2021-03-30 LAB — HEMOGLOBIN A1C: Hgb A1c MFr Bld: 6.2 % (ref 4.6–6.5)

## 2021-04-06 ENCOUNTER — Telehealth: Payer: Self-pay

## 2021-04-06 NOTE — Telephone Encounter (Signed)
Patient could not get into her mychart and I sent her a new link so I gave her the lab results and she understood.  Zyad Boomer,cma

## 2021-04-07 DIAGNOSIS — Z03818 Encounter for observation for suspected exposure to other biological agents ruled out: Secondary | ICD-10-CM | POA: Diagnosis not present

## 2021-04-07 DIAGNOSIS — Z20822 Contact with and (suspected) exposure to covid-19: Secondary | ICD-10-CM | POA: Diagnosis not present

## 2021-04-08 ENCOUNTER — Encounter: Payer: Self-pay | Admitting: Family Medicine

## 2021-04-08 ENCOUNTER — Telehealth (INDEPENDENT_AMBULATORY_CARE_PROVIDER_SITE_OTHER): Payer: PPO | Admitting: Family Medicine

## 2021-04-08 ENCOUNTER — Other Ambulatory Visit: Payer: Self-pay

## 2021-04-08 VITALS — Ht 66.0 in

## 2021-04-08 DIAGNOSIS — U071 COVID-19: Secondary | ICD-10-CM | POA: Diagnosis not present

## 2021-04-08 MED ORDER — MOLNUPIRAVIR EUA 200MG CAPSULE: 4.0000 | ORAL_CAPSULE | Freq: Two times a day (BID) | ORAL | 0 refills | Status: AC

## 2021-04-08 NOTE — Progress Notes (Signed)
     Brannen Koppen T. Arthea Nobel, MD Primary Care and Sports Medicine [facility] at [facility] [place], [address] Phone: [phone]  FAX: [place] - 80 y.o. female  MRN [id number]  Date of Birth: 07/09/1941  Visit Date: 04/08/2021  PCP: McLean-Scocuzza, Nino Glow, MD  Referred by: Orland Mustard *  Virtual Visit via Telephone Note:  I connected with  [name] on 04/08/2021  8:20 AM EDT by telephone and verified that I am speaking with the correct person using two identifiers.   Location patient: home phone or cell phone Location provider: work or home office Consent: Verbal consent directly obtained from [name] and that there may be a patient responsible charge related to this service. Persons participating in the virtual visit: patient, provider  I discussed the limitations of evaluation and management by telemedicine and the availability of in person appointments.  The patient expressed understanding and agreed to proceed.     History of Present Illness:  The patient started to feel poorly on Sunday with a runny nose and congestion, but her symptoms have progressed.  Now she is having HA and chills without fever.  She is coughing a lot, productive sputum.  Her chest feels raw.  She did try an albuterol inhaler, which helped.  No GI or neuro sx  Sunday, nose and cough. Chest feels raw [place]    Review of Systems: pertinent positives and pertinent negatives as per HPI No acute distress verbally   Observations/Objective/Exam:  An attempt was made to discern vital signs over the phone and per patient if applicable and possible.   Neurological:     Mental Status: pleasant and appropriate   Psychiatric:        Thought Content: Thought content normal.      Assessment and Plan:    ICD-10-CM   1. COVID-19  U07.1      Total encounter time: 10 minutes. This includes total time spent on the day  of encounter.   Classic, treat with antivirals.  Continue supportive care.  I discussed the assessment and treatment plan with the patient. The patient was provided an opportunity to ask questions and all were answered. The patient agreed with the plan and demonstrated an understanding of the instructions.   The patient was advised to call back or seek an in-person evaluation if the symptoms worsen or if the condition fails to improve as anticipated.  Follow-up: prn unless noted otherwise below No follow-ups on file.  No orders of the defined types were placed in this encounter.  No orders of the defined types were placed in this encounter.   Signed,  Maud Deed. Diezel Mazur, MD

## 2021-04-10 DIAGNOSIS — U071 COVID-19: Secondary | ICD-10-CM | POA: Diagnosis not present

## 2021-04-19 ENCOUNTER — Ambulatory Visit (INDEPENDENT_AMBULATORY_CARE_PROVIDER_SITE_OTHER): Payer: PPO | Admitting: Family Medicine

## 2021-04-19 ENCOUNTER — Other Ambulatory Visit: Payer: Self-pay

## 2021-04-19 VITALS — Ht 66.0 in | Wt 180.0 lb

## 2021-04-19 DIAGNOSIS — Z03818 Encounter for observation for suspected exposure to other biological agents ruled out: Secondary | ICD-10-CM | POA: Diagnosis not present

## 2021-04-19 DIAGNOSIS — J989 Respiratory disorder, unspecified: Secondary | ICD-10-CM | POA: Diagnosis not present

## 2021-04-19 DIAGNOSIS — I10 Essential (primary) hypertension: Secondary | ICD-10-CM | POA: Diagnosis not present

## 2021-04-19 DIAGNOSIS — Z20822 Contact with and (suspected) exposure to covid-19: Secondary | ICD-10-CM | POA: Diagnosis not present

## 2021-04-19 MED ORDER — DOXYCYCLINE HYCLATE 100 MG PO TABS: 100.0000 mg | ORAL_TABLET | Freq: Two times a day (BID) | ORAL | 0 refills | Status: DC

## 2021-04-19 NOTE — Assessment & Plan Note (Signed)
Somewhat elevated at times that she has been ill recently.  I have asked her to continue to monitor this and we will see how she does after she improves from a recent illness.  She will continue olmesartan 10 mg twice daily.  She will follow-up in about 2 months with her PCP.  I advised that somebody would contact her to schedule this though if she does not hear anything in the next 1 to 2 days she will contact us to schedule follow-up.

## 2021-04-19 NOTE — Assessment & Plan Note (Signed)
The patient has continued issues with cough and congestion after having COVID.  About 2 weeks ago.  Discussed the possibility of a secondary sinus infection or bacterial bronchitis.  I will start her on doxycycline.  Discussed risks of diarrhea and skin sensitivity.  She was advised to eat yogurt while on this to help reduce the risk of diarrhea.  She was also advised to wear long sleeves or sunscreen if she has to go out into the sun.  If she is not starting to feel better in the next 3 to 4 days she will let us know.  If she feels worse at any point she will let us know.  If not improving with this treatment I would consider getting an x-ray.

## 2021-04-19 NOTE — Progress Notes (Signed)
Virtual Visit via telephone Note  This visit type was conducted due to national recommendations for restrictions regarding the COVID-19 pandemic (e.g. social distancing).  This format is felt to be most appropriate for this patient at this time.  All issues noted in this document were discussed and addressed.  No physical exam was performed (except for noted visual exam findings with Video Visits).   I connected with [name] today at 11:30 AM EDT by telephone and verified that I am speaking with the correct person using two identifiers. Location patient: home Location provider: work Persons participating in the virtual visit: patient, provider  I discussed the limitations, risks, security and privacy concerns of performing an evaluation and management service by telephone and the availability of in person appointments. I also discussed with the patient that there may be a patient responsible charge related to this service. The patient expressed understanding and agreed to proceed.  Interactive audio and video telecommunications were attempted between this provider and patient, however failed, due to patient having technical difficulties OR patient did not have access to video capability.  We continued and completed visit with audio only.   Reason for visit: f/u.  HPI: HYPERTENSION Disease Monitoring Home BP Monitoring 130s-140s/60s-70s     Dyspnea-no. Medications Compliance-taking olmesartan 10 mg twice daily BMET    Component Value Date/Time   NA 140 03/30/2021 1017   NA 143 04/07/2016 1158   K 4.4 03/30/2021 1017   K 4.0 04/07/2016 1158   CL 106 03/30/2021 1017   CL 113 (H) 05/22/2012 1004   CO2 25 03/30/2021 1017   CO2 21 (L) 04/07/2016 1158   GLUCOSE 108 (H) 03/30/2021 1017   GLUCOSE 103 04/07/2016 1158   GLUCOSE 111 (H) 05/22/2012 1004   BUN 21 03/30/2021 1017   BUN 23.0 04/07/2016 1158   CREATININE 0.97 03/30/2021 1017   CREATININE 1.10 (H) 04/14/2020 0826    CREATININE 0.9 04/07/2016 1158   CALCIUM 9.3 03/30/2021 1017   CALCIUM 9.3 04/07/2016 1158   GFRNONAA 60 (L) 02/24/2021 0945   GFRAA >60 05/31/2019 1251   COVID/congestion: Patient notes COVID symptoms started on 9/18.  She did a visit on 9/22 and received molnupiravir.  She notes that helped some though she has continued to have cough and congestion particularly in her chest.  She is coughing up green mucus.  She is not blowing much out of her nose.  No fevers currently.  No shortness of breath.  She does have some postnasal drip.  She notes [facility] came out and checked her at home and they noted no pneumonia on exam.  She has been taking plain Mucinex.   ROS: See pertinent positives and negatives per HPI.  Past Medical History:  Diagnosis Date   Acid reflux    Allergy    Anemia    Anxiety    in past   Arthritis    all over   Asthma    Atypical moles    Bronchitis    Bronchospasm    reactive with perfume/cologne and smoke   CAD (coronary artery disease)    patient denies on preop of 10/24    Candida esophagitis (HCC)    Carotid artery calcification, bilateral 04/2016   Cataract    Cervical stenosis of spine    Chronic insomnia    Complication of anesthesia    shoulder surgery- not all the way asleep   COVID-19 02/04/2021   Depression    in past  Diverticulitis    Diverticulosis    Dizziness    Elevated liver enzymes    Family history of adverse reaction to anesthesia    sister- has problems with nausea and vomiting    Fatigue    Fatty liver    Generalized headache    migraines   GERD (gastroesophageal reflux disease)    History of chemotherapy    topical cream for h/o skin CA   History of kidney stones    History of neuropathy    FH Dr. Posey Pronto neurology   History of vertigo    Hx of migraines    Hyperlipidemia    Hypothyroidism    Insomnia    Irritable bowel syndrome    LLQ abdominal pain    Melanoma ([place]) 2013   Melanoma ([place])    x2 stg II melanoma    Neuropathy, peripheral    Peripheral neuropathy    Pneumonia    hx of x 2   PONV (postoperative nausea and vomiting)    Pre-diabetes    Retina disorder    left   SCC (squamous cell carcinoma)    skin    Stroke ([place])    ? TIA per Dr V per patient    Thyroid disease    Urine incontinence    Vitamin D deficiency    Weight gain     Past Surgical History:  Procedure Laterality Date   BILATERAL SALPINGOOPHORECTOMY     ovarian cyst Dr. Ouida Sills and Dr. Alisia Ferrari 2008   BREAST SURGERY     in 2014 bx breast calcifications   BREAST SURGERY     augmentation with saline Dr. Hubbard Hartshorn 1995    CHOLECYSTECTOMY N/A 01/28/2015   Procedure: LAPAROSCOPIC CHOLECYSTECTOMY WITH INTRAOPERATIVE CHOLANGIOGRAM;  Surgeon: Autumn Messing III, MD;  Location: [facility];  Service: General;  Laterality: N/A;   CLOSED MANIPULATION SHOULDER     rt   COLONOSCOPY WITH PROPOFOL N/A 11/20/2012   Procedure: COLONOSCOPY WITH PROPOFOL;  Surgeon: Garlan Fair, MD;  Location: [facility];  Service: Endoscopy;  Laterality: N/A;   colonscopy     May 2014   JOINT REPLACEMENT     RTK   KNEE ARTHROSCOPY     left knee miniscus tear repair     MELANOMA EXCISION WITH SENTINEL LYMPH NODE BIOPSY  03/18/11   Back, nodes neg   OOPHORECTOMY     ROTATOR CUFF REPAIR     left   SHOULDER SURGERY     right and left    SKIN CANCER EXCISION     multiple   TOTAL HIP ARTHROPLASTY     TOTAL KNEE ARTHROPLASTY     right 2010 and left in 2018    [facility] Left 05/16/2017   Procedure: LEFT TOTAL KNEE ARTHROPLASTY;  Surgeon: Paralee Cancel, MD;  Location: [facility];  Service: Orthopedics;  Laterality: Left;  70 mins   TUBAL LIGATION     TUMOR REMOVAL     ovary    Family History  Problem Relation Age of Onset   Hypertension Mother    Stroke Mother    Hyperlipidemia Mother    Heart disease Father    Diabetes Father    Hypertension Father    Hyperlipidemia Father    Early death Father    Diabetes Sister    Heart  disease Sister        heart murmur   Arthritis Sister    COPD Sister    Hyperlipidemia Sister  Hypertension Sister    Pancreatic cancer Maternal Uncle    Alzheimer's disease Paternal Aunt    Colon cancer Maternal Grandfather    Cancer Maternal Grandfather    Early death Maternal Grandfather    Alzheimer's disease Paternal Aunt    Alzheimer's disease Paternal Aunt    Colon polyps Sister        adenomous   Arthritis Sister    Heart disease Sister    Hyperlipidemia Sister    Hypertension Sister    Cancer Sister    Heart disease Daughter    Hyperlipidemia Daughter    Hypertension Daughter    Heart disease Son    Hyperlipidemia Son    Alcohol abuse Son    Depression Son    Diabetes Son    Stroke Maternal Grandmother    Early death Paternal Grandmother    Stroke Paternal Grandmother    Cancer Other        2 paternal uncles and 1 maternal uncle colon cancer   Alzheimer's disease Paternal Aunt     SOCIAL HX: Non-smoker   Current Outpatient Medications:    albuterol (PROAIR HFA) 108 (90 Base) MCG/ACT inhaler, Inhale 1-2 puffs into the lungs every 6 (six) hours as needed for wheezing or shortness of breath., Disp: 18 g, Rfl: 11   aspirin 81 MG tablet, TAKE 1 TABLET BY MOUTH EVERY DAY, Disp: , Rfl:    atorvastatin (LIPITOR) 20 MG tablet, Take 1 tablet (20 mg total) by mouth daily., Disp: 90 tablet, Rfl: 3   Calcium 600-200 MG-UNIT tablet, Take 1 tablet daily by mouth., Disp: , Rfl:    Cholecalciferol (VITAMIN D3) 2000 UNITS capsule, Take 2,000 Units by mouth daily., Disp: , Rfl:    clindamycin (CLEOCIN) 150 MG capsule, Take 150 mg by mouth 4 (four) times daily., Disp: , Rfl:    doxycycline (VIBRA-TABS) 100 MG tablet, Take 1 tablet (100 mg total) by mouth 2 (two) times daily., Disp: 14 tablet, Rfl: 0   FOLIC ACID PO, Take by mouth daily., Disp: , Rfl:    levothyroxine (SYNTHROID) 75 MCG tablet, Take 1 tablet (75 mcg total) by mouth daily before breakfast., Disp: 90 tablet, Rfl:  3   olmesartan (BENICAR) 5 MG tablet, Take 2 tablets (10 mg total) by mouth in the morning and at bedtime., Disp: 360 tablet, Rfl: 1   Omega-3 Fatty Acids (FISH OIL) 1000 MG CAPS, Take 1,000 mg by mouth daily. , Disp: , Rfl:    sertraline (ZOLOFT) 25 MG tablet, Take 1 tablet (25 mg total) by mouth daily., Disp: 90 tablet, Rfl: 3   vitamin B-12 (CYANOCOBALAMIN) 1000 MCG tablet, Take 2,000 mcg by mouth daily. 2 tabs, Disp: , Rfl:    omeprazole (PRILOSEC) 20 MG capsule, Take 1 capsule (20 mg total) by mouth 2 (two) times daily before a meal., Disp: 60 capsule, Rfl: 11  EXAM: This was a telephone visit and thus no physical exam was completed.  ASSESSMENT AND PLAN:  Discussed the following assessment and plan:  Problem List Items Addressed This Visit     Essential hypertension - Primary    Somewhat elevated at times that she has been ill recently.  I have asked her to continue to monitor this and we will see how she does after she improves from a recent illness.  She will continue olmesartan 10 mg twice daily.  She will follow-up in about 2 months with her PCP.  I advised that somebody would contact her to schedule this  though if she does not hear anything in the next 1 to 2 days she will contact us to schedule follow-up.      Respiratory illness    The patient has continued issues with cough and congestion after having COVID.  About 2 weeks ago.  Discussed the possibility of a secondary sinus infection or bacterial bronchitis.  I will start her on doxycycline.  Discussed risks of diarrhea and skin sensitivity.  She was advised to eat yogurt while on this to help reduce the risk of diarrhea.  She was also advised to wear long sleeves or sunscreen if she has to go out into the sun.  If she is not starting to feel better in the next 3 to 4 days she will let us know.  If she feels worse at any point she will let us know.  If not improving with this treatment I would consider getting an x-ray.       Relevant Medications   doxycycline (VIBRA-TABS) 100 MG tablet    Return in about 2 months (around 06/19/2021) for HTN, with PCP.   I discussed the assessment and treatment plan with the patient. The patient was provided an opportunity to ask questions and all were answered. The patient agreed with the plan and demonstrated an understanding of the instructions.   The patient was advised to call back or seek an in-person evaluation if the symptoms worsen or if the condition fails to improve as anticipated.  I provided 12 minutes of non-face-to-face time during this encounter.   Tommi Rumps, MD

## 2021-04-20 ENCOUNTER — Ambulatory Visit (INDEPENDENT_AMBULATORY_CARE_PROVIDER_SITE_OTHER): Payer: PPO | Admitting: Family

## 2021-04-20 ENCOUNTER — Telehealth: Payer: Self-pay | Admitting: Internal Medicine

## 2021-04-20 ENCOUNTER — Encounter: Payer: Self-pay | Admitting: Family

## 2021-04-20 VITALS — Ht 65.98 in | Wt 180.0 lb

## 2021-04-20 DIAGNOSIS — U071 COVID-19: Secondary | ICD-10-CM | POA: Diagnosis not present

## 2021-04-20 DIAGNOSIS — U099 Post covid-19 condition, unspecified: Secondary | ICD-10-CM | POA: Diagnosis not present

## 2021-04-20 NOTE — Progress Notes (Signed)
Virtual Visit via Telephone Note  I connected with [name] on 04/20/21 at  2:15 PM EDT by telephone and verified that I am speaking with the correct person using two identifiers.  Location: Patient: Home  Provider: [employer]   I discussed the limitations, risks, security and privacy concerns of performing an evaluation and management service by telephone and the availability of in person appointments. I also discussed with the patient that there may be a patient responsible charge related to this service. The patient expressed understanding and agreed to proceed.   History of Present Illness: 80 year old female presents via telephone visit with concerns that her rapid and PCR test are still positive despite taking Molnupiravir x 5 days. She reports resting yesterday PCR and rapid antigen that are still positive. She is concerned and requesting more antiviral therapy. She is currently taking Doxycycline. Has symptoms of cough, congestion, no fever or chills    Observations/Objective: A&O, NAD, pleasantly frustrated   Assessment and Plan: [name] was seen today for covid positive and urinary symptoms.  Diagnoses and all orders for this visit:  [id number]  Post-COVID-19 condition     Follow Up Instructions:Explained to patient that a small percentage of patient who test positive for COVID-19 and take antiviral therapy will have rebound COVID (returning of the symptoms after completion of the medication). She is likely falling into this category but does not need additional antiviral treatment. Patient expresses frustration as she really would like another round of Molnupiravir or IV infusion. Discussed that this is not the recommendation and she should complete her Doxycycline and take OTC medication to help with the symptoms. Advised do not take another PCR for 90 days. Rapid antigen test can be useful with measuring contagiousness. Continue to wear a mask while around others  while she is positive via rapid antigen and having symptoms.     I discussed the assessment and treatment plan with the patient. The patient was provided an opportunity to ask questions and all were answered. The patient agreed with the plan and demonstrated an understanding of the instructions.   The patient was advised to call back or seek an in-person evaluation if the symptoms worsen or if the condition fails to improve as anticipated.  I provided 20 minutes of non-face-to-face time during this encounter.   Kennyth Arnold, FNP

## 2021-04-20 NOTE — Telephone Encounter (Signed)
Providing Access Nurse Documentation from 04/20/21

## 2021-04-20 NOTE — Telephone Encounter (Signed)
Patient informed, Due to the high volume of calls and your symptoms we have to forward your call to our Triage Nurse to expedient your call. Please hold for the transfer.  Patient transferred to Access Nurse. Due to still testing positive for COVID after taking antiviral medication for COVID and she is still having a cough and runny nose.Patient is unsure if she she should self isolate due to her positive test and would like to speak with a triage nurse.

## 2021-04-20 NOTE — Telephone Encounter (Signed)
Patient called in stating that she has questions after her visit with [facility] and she is unsure if she should be around other people.Please advise.

## 2021-04-20 NOTE — Telephone Encounter (Signed)
It looks like she has been treated for COVID in the last several weeks.  Her COVID test could continue to be positive for up to 90 days after her recent COVID illness and it is unlikely that this would represent a new COVID infection as her symptoms never completely went away.  She should not need to be treated again.  It looks like she has been scheduled to discuss this with Dutch Quint later today.  I will forward this to her so she is aware.

## 2021-04-20 NOTE — Telephone Encounter (Signed)
Pt was evaluated again on 04/20/21 by Dutch Quint, FNP

## 2021-04-20 NOTE — Telephone Encounter (Signed)
Providing Access Nurse Documentation.   

## 2021-04-20 NOTE — Telephone Encounter (Signed)
Reviewed Dr Ellen Henri note from yesterday.  Per note, was diagnosed with covid 2 weeks ago.  Treated.  Guidelines - fever free for at least 24 hours without taking medication that keeps temperature down and symptoms improving.  (You do not have to test negative).  The visit yesterday, Dr Caryl Bis was concerned regarding a possible secondary infection and started her on abx.  Would recommend usual precautions with new infection.  Let me know if questions.

## 2021-04-20 NOTE — Telephone Encounter (Signed)
Patient stated she had a telephone visit with Dr Carmelina Peal yesterday. She went to [place] yesterday and was tested for covid and it was positive. She wants to know about getting more antiviral medication and she has not started the medication that Dr Caryl Bis prescribed yesterday. Please call.

## 2021-04-26 DIAGNOSIS — Z03818 Encounter for observation for suspected exposure to other biological agents ruled out: Secondary | ICD-10-CM | POA: Diagnosis not present

## 2021-04-26 DIAGNOSIS — Z20822 Contact with and (suspected) exposure to covid-19: Secondary | ICD-10-CM | POA: Diagnosis not present

## 2021-04-27 ENCOUNTER — Telehealth: Payer: Self-pay | Admitting: Internal Medicine

## 2021-04-27 NOTE — Telephone Encounter (Signed)
Patient calling in and needing the records from her two COVID 19 related visits. States she had to cancel her vacation but she had insurance on it. She needs proof of having COVID-19 to file her claim.   Printed her September and October visit. Patient will also contact [facility] to print a copy of her positive test.   Placed upfront for pick up.

## 2021-05-04 ENCOUNTER — Encounter: Payer: Self-pay | Admitting: Internal Medicine

## 2021-05-05 ENCOUNTER — Ambulatory Visit (INDEPENDENT_AMBULATORY_CARE_PROVIDER_SITE_OTHER): Payer: PPO

## 2021-05-05 ENCOUNTER — Other Ambulatory Visit: Payer: Self-pay

## 2021-05-05 ENCOUNTER — Ambulatory Visit (INDEPENDENT_AMBULATORY_CARE_PROVIDER_SITE_OTHER): Payer: PPO | Admitting: Internal Medicine

## 2021-05-05 ENCOUNTER — Encounter: Payer: Self-pay | Admitting: Internal Medicine

## 2021-05-05 VITALS — BP 130/86 | HR 76 | Temp 97.0°F | Ht 65.98 in | Wt 186.6 lb

## 2021-05-05 DIAGNOSIS — R32 Unspecified urinary incontinence: Secondary | ICD-10-CM | POA: Diagnosis not present

## 2021-05-05 DIAGNOSIS — R059 Cough, unspecified: Secondary | ICD-10-CM

## 2021-05-05 DIAGNOSIS — I10 Essential (primary) hypertension: Secondary | ICD-10-CM | POA: Diagnosis not present

## 2021-05-05 DIAGNOSIS — R109 Unspecified abdominal pain: Secondary | ICD-10-CM

## 2021-05-05 DIAGNOSIS — A630 Anogenital (venereal) warts: Secondary | ICD-10-CM | POA: Diagnosis not present

## 2021-05-05 DIAGNOSIS — R102 Pelvic and perineal pain: Secondary | ICD-10-CM

## 2021-05-05 DIAGNOSIS — R053 Chronic cough: Secondary | ICD-10-CM

## 2021-05-05 DIAGNOSIS — U071 COVID-19: Secondary | ICD-10-CM

## 2021-05-05 DIAGNOSIS — J40 Bronchitis, not specified as acute or chronic: Secondary | ICD-10-CM

## 2021-05-05 MED ORDER — STIOLTO RESPIMAT 2.5-2.5 MCG/ACT IN AERS: 2.0000 | INHALATION_SPRAY | Freq: Every day | RESPIRATORY_TRACT | 0 refills | Status: DC

## 2021-05-05 NOTE — Patient Instructions (Addendum)
Nasal saline 1st sprays and flonase 1-2 sprays as needed  Over the counter claritin, allergra, zyrtec, xyzal (new)   Dr Sherlene Shams G[facility] [place] [id number]   [phone]  [phone]     Dr. Kathie Dike lung specialist  Phone Fax E-mail Address  [phone] [email or web address] [address]   [address]   [place] [address]   Cough, Adult Coughing is a reflex that clears your throat and your airways (respiratory system). Coughing helps to heal and protect your lungs. It is normal to cough occasionally, but a cough that happens with other symptoms or lasts a long time may be a sign of a condition that needs treatment. An acute cough may only last 2-3 weeks, while a chronic cough may last 8 or more weeks. Coughing is commonly caused by: Infection of the respiratory systemby viruses or bacteria. Breathing in substances that irritate your lungs. Allergies. Asthma. Mucus that runs down the back of your throat (postnasal drip). Smoking. Acid backing up from the stomach into the esophagus (gastroesophageal reflux). Certain medicines. Chronic lung problems. Other medical conditions such as heart failure or a blood clot in the lung (pulmonary embolism). Follow these instructions at home: Medicines Take over-the-counter and prescription medicines only as told by your health care provider. Talk with your health care provider before you take a cough suppressant medicine. Lifestyle  Avoid cigarette smoke. Do not use any products that contain nicotine or tobacco, such as cigarettes, e-cigarettes, and chewing tobacco. If you need help quitting, ask your health care provider. Drink enough fluid to keep your urine pale yellow. Avoid caffeine. Do not drink alcohol if your health care provider tells you not to drink. General instructions  Pay close attention to changes in your cough. Tell your health care provider about  them. Always cover your mouth when you cough. Avoid things that make you cough, such as perfume, candles, cleaning products, or campfire or tobacco smoke. If the air is dry, use a cool mist vaporizer or humidifier in your bedroom or your home to help loosen secretions. If your cough is worse at night, try to sleep in a semi-upright position. Rest as needed. Keep all follow-up visits as told by your health care provider. This is important. Contact a health care provider if you: Have new symptoms. Cough up pus. Have a cough that does not get better after 2-3 weeks or gets worse. Cannot control your cough with cough suppressant medicines and you are losing sleep. Have pain that gets worse or pain that is not helped with medicine. Have a fever. Have unexplained weight loss. Have night sweats. Get help right away if: You cough up blood. You have difficulty breathing. Your heartbeat is very fast. These symptoms may represent a serious problem that is an emergency. Do not wait to see if the symptoms will go away. Get medical help right away. Call your local emergency services (911 in the [place] Do not drive yourself to the hospital. Summary Coughing is a reflex that clears your throat and your airways. It is normal to cough occasionally, but a cough that happens with other symptoms or lasts a long time may be a sign of a condition that needs treatment. Take over-the-counter and prescription medicines only as told by your health care provider. Always cover your mouth when you cough. Contact a health care provider if you have new symptoms or a cough that does not get better after 2-3 weeks  or gets worse. This information is not intended to replace advice given to you by your health care provider. Make sure you discuss any questions you have with your health care provider. Document Revised: 07/23/2018 Document Reviewed: 07/23/2018 Elsevier Patient Education  [place].

## 2021-05-05 NOTE — Progress Notes (Signed)
Chief Complaint  Patient presents with   Follow-up    Pt requests a pap smear. PT c/o of continued abdominal pain and a past hx of genital warts. Pt is concerned over possible cervical cancer.   Nasal Congestion    Pt had covid on 04/05/21. Pt is still experiencing cough and congestion.   Ankle Pain   Follow up  1. Still c/o lower abdominal pain/pelvic pain h/o genital warts and h/o urinary incontinence wants referral to ob/gyn Pt declines ct ab/pelvis with contrast for now  2. Htn controlled on benicar 10 mg bid  3. Covid +04/05/21 with chronic bronchitis and recurrent URI tried mucinex, coricidan w/o relief  Will refer back to pulm in [place] to further work up : Dr. Lake Bells, Dagoberto Ligas  chronic bronchitis/cough s/p covid late 03/2021 early 04/2021 ? pulm fibrosis, cough with yellow phelgm still producing and having congestion after covid  Pharm D Catie demonstarted stioloto inhaler today given 2.5 respimat which is 2 week supply    Review of Systems  Constitutional:  Negative for weight loss.  HENT:  Negative for hearing loss.   Eyes:  Negative for blurred vision.  Respiratory:  Positive for cough and sputum production.   Cardiovascular:  Negative for chest pain.  Gastrointestinal:  Positive for abdominal pain.  Genitourinary:        +incontinence    Musculoskeletal:  Positive for falls. Negative for joint pain.  Skin:  Negative for rash.  Past Medical History:  Diagnosis Date   Acid reflux    Allergy    Anemia    Anxiety    in past   Arthritis    all over   Asthma    Atypical moles    Bronchitis    Bronchospasm    reactive with perfume/cologne and smoke   CAD (coronary artery disease)    patient denies on preop of 10/24    Candida esophagitis (HCC)    Carotid artery calcification, bilateral 04/2016   Cataract    Cervical stenosis of spine    Chronic insomnia    Complication of anesthesia    shoulder surgery- not all the way asleep   COVID-19 02/04/2021   also  10/3 or 04/20/21 sxs started 04/05/21   Depression    in past   Diverticulitis    Diverticulosis    Dizziness    Elevated liver enzymes    Family history of adverse reaction to anesthesia    sister- has problems with nausea and vomiting    Fatigue    Fatty liver    Generalized headache    migraines   GERD (gastroesophageal reflux disease)    History of chemotherapy    topical cream for h/o skin CA   History of kidney stones    History of neuropathy    FH Dr. Posey Pronto neurology   History of vertigo    Hx of migraines    Hyperlipidemia    Hypothyroidism    Insomnia    Irritable bowel syndrome    LLQ abdominal pain    Melanoma ([place]) 2013   Melanoma ([place])    x2 stg II melanoma   Neuropathy, peripheral    Peripheral neuropathy    Pneumonia    hx of x 2   PONV (postoperative nausea and vomiting)    Pre-diabetes    Retina disorder    left   SCC (squamous cell carcinoma)    skin    Stroke ([place])    ? TIA  per Dr V per patient    Thyroid disease    Urine incontinence    Vitamin D deficiency    Weight gain    Past Surgical History:  Procedure Laterality Date   BILATERAL SALPINGOOPHORECTOMY     ovarian cyst Dr. Ouida Sills and Dr. Alisia Ferrari 2008   BREAST SURGERY     in 2014 bx breast calcifications   BREAST SURGERY     augmentation with saline Dr. Hubbard Hartshorn 1995    CHOLECYSTECTOMY N/A 01/28/2015   Procedure: LAPAROSCOPIC CHOLECYSTECTOMY WITH INTRAOPERATIVE CHOLANGIOGRAM;  Surgeon: Autumn Messing III, MD;  Location: [facility];  Service: General;  Laterality: N/A;   CLOSED MANIPULATION SHOULDER     rt   COLONOSCOPY WITH PROPOFOL N/A 11/20/2012   Procedure: COLONOSCOPY WITH PROPOFOL;  Surgeon: Garlan Fair, MD;  Location: [facility];  Service: Endoscopy;  Laterality: N/A;   colonscopy     May 2014   JOINT REPLACEMENT     RTK   KNEE ARTHROSCOPY     left knee miniscus tear repair     MELANOMA EXCISION WITH SENTINEL LYMPH NODE BIOPSY  03/18/11   Back, nodes neg   OOPHORECTOMY      ROTATOR CUFF REPAIR     left   SHOULDER SURGERY     right and left    SKIN CANCER EXCISION     multiple   TOTAL HIP ARTHROPLASTY     TOTAL KNEE ARTHROPLASTY     right 2010 and left in 2018    [place] Left 05/16/2017   Procedure: LEFT TOTAL KNEE ARTHROPLASTY;  Surgeon: Paralee Cancel, MD;  Location: [facility];  Service: Orthopedics;  Laterality: Left;  70 mins   TUBAL LIGATION     TUMOR REMOVAL     ovary   Family History  Problem Relation Age of Onset   Hypertension Mother    Stroke Mother    Hyperlipidemia Mother    Heart disease Father    Diabetes Father    Hypertension Father    Hyperlipidemia Father    Early death Father    Diabetes Sister    Heart disease Sister        heart murmur   Arthritis Sister    COPD Sister    Hyperlipidemia Sister    Hypertension Sister    Pancreatic cancer Maternal Uncle    Alzheimer's disease Paternal Aunt    Colon cancer Maternal Grandfather    Cancer Maternal Grandfather    Early death Maternal Grandfather    Alzheimer's disease Paternal Aunt    Alzheimer's disease Paternal Aunt    Colon polyps Sister        adenomous   Arthritis Sister    Heart disease Sister    Hyperlipidemia Sister    Hypertension Sister    Cancer Sister    Heart disease Daughter    Hyperlipidemia Daughter    Hypertension Daughter    Heart disease Son    Hyperlipidemia Son    Alcohol abuse Son    Depression Son    Diabetes Son    Stroke Maternal Grandmother    Early death Paternal Grandmother    Stroke Paternal Grandmother    Cancer Other        2 paternal uncles and 1 maternal uncle colon cancer   Alzheimer's disease Paternal Aunt    Social History   Socioeconomic History   Marital status: Divorced    Spouse name: Not on file   Number of children: 3  Years of education: College   Highest education level: Some college, no degree  Occupational History   Occupation: Retired    Fish farm manager: RETIRED  Tobacco Use   Smoking status: Never    Smokeless tobacco: Never  Vaping Use   Vaping Use: Never used  Substance and Sexual Activity   Alcohol use: Yes    Alcohol/week: 1.0 standard drink    Types: 1 Cans of beer per week    Comment: once a week beer or wine   Drug use: No   Sexual activity: Not Currently  Other Topics Concern   Not on file  Social History Narrative   Patient lives at home alone in a one story home.   Has 3 children (2 daughters and 1 son)    Retired from [employer]   Divorced since 1992    Caffeine Use: 1 cup daily and a soft drink occasionally    No guns    Wears seat belt     Lives with cat    Left handed   Social Determinants of Health   Financial Resource Strain: Not on file  Food Insecurity: Not on file  Transportation Needs: Not on file  Physical Activity: Not on file  Stress: Not on file  Social Connections: Not on file  Intimate Partner Violence: Not on file   Current Meds  Medication Sig   albuterol (PROAIR HFA) 108 (90 Base) MCG/ACT inhaler Inhale 1-2 puffs into the lungs every 6 (six) hours as needed for wheezing or shortness of breath.   aspirin 81 MG tablet TAKE 1 TABLET BY MOUTH EVERY DAY   atorvastatin (LIPITOR) 20 MG tablet Take 1 tablet (20 mg total) by mouth daily.   Calcium 600-200 MG-UNIT tablet Take 1 tablet daily by mouth.   Cholecalciferol (VITAMIN D3) 2000 UNITS capsule Take 2,000 Units by mouth daily.   clindamycin (CLEOCIN) 150 MG capsule Take 150 mg by mouth 4 (four) times daily.   doxycycline (VIBRA-TABS) 100 MG tablet Take 1 tablet (100 mg total) by mouth 2 (two) times daily.   FOLIC ACID PO Take by mouth daily.   levothyroxine (SYNTHROID) 75 MCG tablet Take 1 tablet (75 mcg total) by mouth daily before breakfast.   olmesartan (BENICAR) 5 MG tablet Take 2 tablets (10 mg total) by mouth in the morning and at bedtime.   Omega-3 Fatty Acids (FISH OIL) 1000 MG CAPS Take 1,000 mg by mouth daily.    sertraline (ZOLOFT) 25 MG tablet Take 1 tablet (25 mg  total) by mouth daily.   Tiotropium Bromide-Olodaterol (STIOLTO RESPIMAT) 2.5-2.5 MCG/ACT AERS Inhale 2 puffs into the lungs daily. Medication Samples have been provided to the patient.  Drug name: stiolto      Strength: 2.5/2.5        Qty: 1 LOT: [id number]  Exp.Date: AUG 2023  The patient has been instructed regarding the correct time, dose, and frequency of taking this medication, including desired effects and most common side effects.   vitamin B-12 (CYANOCOBALAMIN) 1000 MCG tablet Take 2,000 mcg by mouth daily. 2 tabs   Allergies  Allergen Reactions   Epinephrine Other (See Comments) and Anaphylaxis    Heart palpitations   Penicillins Swelling and Other (See Comments)    Lip swelling Has patient had a PCN reaction causing immediate rash, facial/tongue/throat swelling, SOB or lightheadedness with hypotension: Yes Has patient had a PCN reaction causing severe rash involving mucus membranes or skin necrosis: No Has patient had a PCN reaction that  required hospitalization No Has patient had a PCN reaction occurring within the last 10 years: No If all of the above answers are "NO", then may proceed with Cephalosporin use.   Lidocaine Hcl Other (See Comments)   Other     Black pepper- caused bronchial tubes to start closing    Valium [Diazepam] Other (See Comments)    Other reaction(s): Other (See Comments) Overly sensitive  sedation   Recent Results (from the past 2160 hour(s))  I-STAT creatinine     Status: None   Collection Time: 02/17/21 11:09 AM  Result Value Ref Range   Creatinine, Ser 0.90 0.44 - 1.00 mg/dL  Basic metabolic panel     Status: Abnormal   Collection Time: 02/24/21  9:45 AM  Result Value Ref Range   Sodium 139 135 - 145 mmol/L   Potassium 4.2 3.5 - 5.1 mmol/L   Chloride 105 98 - 111 mmol/L   CO2 24 22 - 32 mmol/L   Glucose, Bld 141 (H) 70 - 99 mg/dL    Comment: Glucose reference range applies only to samples taken after fasting for at least 8 hours.   BUN 14 8  - 23 mg/dL   Creatinine, Ser 0.96 0.44 - 1.00 mg/dL   Calcium 9.1 8.9 - 10.3 mg/dL   GFR, Estimated 60 (L) >60 mL/min    Comment: (NOTE) Calculated using the CKD-EPI Creatinine Equation (2021)    Anion gap 10 5 - 15    Comment: Performed at [facility], [address] [place], [place] [address]  CBC     Status: None   Collection Time: 02/24/21  9:45 AM  Result Value Ref Range   WBC 5.3 4.0 - 10.5 K/uL   RBC 4.52 3.87 - 5.11 MIL/uL   Hemoglobin 13.7 12.0 - 15.0 g/dL   HCT 41.2 36.0 - 46.0 %   MCV 91.2 80.0 - 100.0 fL   MCH 30.3 26.0 - 34.0 pg   MCHC 33.3 30.0 - 36.0 g/dL   RDW 13.2 11.5 - 15.5 %   Platelets 176 150 - 400 K/uL   nRBC 0.0 0.0 - 0.2 %    Comment: Performed at [facility], [address] [place], [place] [address]  Sedimentation rate     Status: None   Collection Time: 02/24/21  9:45 AM  Result Value Ref Range   Sed Rate 3 0 - 30 mm/hr    Comment: Performed at [facility], [address] [place], [place] [address]  Urinalysis, Complete w Microscopic Urine, Clean Catch     Status: Abnormal   Collection Time: 02/24/21  1:12 PM  Result Value Ref Range   Color, Urine STRAW (A) YELLOW   APPearance HAZY (A) CLEAR   Specific Gravity, Urine 1.006 1.005 - 1.030   pH 5.0 5.0 - 8.0   Glucose, UA NEGATIVE NEGATIVE mg/dL   Hgb urine dipstick NEGATIVE NEGATIVE   Bilirubin Urine NEGATIVE NEGATIVE   Ketones, ur NEGATIVE NEGATIVE mg/dL   Protein, ur NEGATIVE NEGATIVE mg/dL   Nitrite NEGATIVE NEGATIVE   Leukocytes,Ua MODERATE (A) NEGATIVE   RBC / HPF 0-5 0 - 5 RBC/hpf   WBC, UA 11-20 0 - 5 WBC/hpf   Bacteria, UA NONE SEEN NONE SEEN   Squamous Epithelial / LPF 0-5 0 - 5   Non Squamous Epithelial PRESENT (A) NONE SEEN    Comment: Performed at [facility], [address], [place], [place] [address]  Lipid panel  Status: None   Collection Time: 03/11/21 10:54 AM  Result Value Ref Range   Cholesterol 136 0 - 200 mg/dL     Comment: ATP III Classification       Desirable:  < 200 mg/dL               Borderline High:  200 - 239 mg/dL          High:  > = 240 mg/dL   Triglycerides 94.0 0.0 - 149.0 mg/dL    Comment: Normal:  <150 mg/dLBorderline High:  150 - 199 mg/dL   HDL 66.20 >39.00 mg/dL   VLDL 18.8 0.0 - 40.0 mg/dL   LDL Cholesterol 51 0 - 99 mg/dL   Total CHOL/HDL Ratio 2     Comment:                Men          Women1/2 Average Risk     3.4          3.3Average Risk          5.0          4.42X Average Risk          9.6          7.13X Average Risk          15.0          11.0                       NonHDL 70.06     Comment: NOTE:  Non-HDL goal should be 30 mg/dL higher than patient's LDL goal (i.e. LDL goal of < 70 mg/dL, would have non-HDL goal of < 100 mg/dL)  Basic Metabolic Panel (BMET)     Status: Abnormal   Collection Time: 03/11/21 10:54 AM  Result Value Ref Range   Sodium 138 135 - 145 mEq/L   Potassium 4.4 3.5 - 5.1 mEq/L   Chloride 105 96 - 112 mEq/L   CO2 24 19 - 32 mEq/L   Glucose, Bld 117 (H) 70 - 99 mg/dL   BUN 18 6 - 23 mg/dL   Creatinine, Ser 1.13 0.40 - 1.20 mg/dL   GFR 46.03 (L) >60.00 mL/min    Comment: Calculated using the CKD-EPI Creatinine Equation (2021)   Calcium 9.7 8.4 - 10.5 mg/dL  HgB A1c     Status: None   Collection Time: 03/30/21 10:17 AM  Result Value Ref Range   Hgb A1c MFr Bld 6.2 4.6 - 6.5 %    Comment: Glycemic Control Guidelines for People with Diabetes:Non Diabetic:  <6%Goal of Therapy: <7%Additional Action Suggested:  >9%   Basic Metabolic Panel (BMET)     Status: Abnormal   Collection Time: 03/30/21 10:17 AM  Result Value Ref Range   Sodium 140 135 - 145 mEq/L   Potassium 4.4 3.5 - 5.1 mEq/L   Chloride 106 96 - 112 mEq/L   CO2 25 19 - 32 mEq/L   Glucose, Bld 108 (H) 70 - 99 mg/dL   BUN 21 6 - 23 mg/dL   Creatinine, Ser 0.97 0.40 - 1.20 mg/dL   GFR 55.27 (L) >60.00 mL/min    Comment: Calculated using the CKD-EPI Creatinine Equation (2021)   Calcium 9.3 8.4 -  10.5 mg/dL   Objective  Body mass index is 30.13 kg/m. Wt Readings from Last 3 Encounters:  05/05/21 186 lb 9.6 oz (84.6 kg)  04/20/21 180 lb (81.6 kg)  04/19/21 180 lb (81.6 kg)   Temp Readings from Last 3 Encounters:  05/05/21 (!) 97 F (36.1 C)  03/04/21 97.6 F (36.4 C) (Oral)  02/24/21 97.9 F (36.6 C) (Oral)   BP Readings from Last 3 Encounters:  05/05/21 130/86  03/04/21 120/80  03/02/21 123/73   Pulse Readings from Last 3 Encounters:  05/05/21 76  03/04/21 67  03/02/21 75    Physical Exam Vitals and nursing note reviewed.  Constitutional:      Appearance: Normal appearance. She is well-developed and well-groomed.  HENT:     Head: Normocephalic and atraumatic.  Eyes:     Conjunctiva/sclera: Conjunctivae normal.     Pupils: Pupils are equal, round, and reactive to light.  Cardiovascular:     Rate and Rhythm: Normal rate and regular rhythm.     Heart sounds: Normal heart sounds. No murmur heard. Pulmonary:     Effort: Pulmonary effort is normal.     Breath sounds: Normal breath sounds.  Abdominal:     Tenderness: There is no abdominal tenderness.  Skin:    General: Skin is warm and dry.  Neurological:     General: No focal deficit present.     Mental Status: She is alert and oriented to person, place, and time. Mental status is at baseline.     Gait: Gait normal.  Psychiatric:        Attention and Perception: Attention and perception normal.        Mood and Affect: Mood and affect normal.        Speech: Speech normal.        Behavior: Behavior normal. Behavior is cooperative.        Thought Content: Thought content normal.        Cognition and Memory: Cognition and memory normal.        Judgment: Judgment normal.    Assessment  Plan  COVID-19 with h/o chronic bronchitis and ? If pulmonary fibrosis vs other etiology as the cause - Plan: Ambulatory referral to Pulmonology in [place] 2nd opinion  DG Chest 2 View 05/05/21  FINDINGS: The  cardiomediastinal contours are normal. Mild biapical pleuroparenchymal scarring, the lungs are otherwise clear. Pulmonary vasculature is normal. No consolidation, pleural effusion, or pneumothorax. No acute osseous abnormalities are seen. Surgical clips in the left axilla. Degenerative change in the lower thoracic spine and left shoulder. Probable os acromial on the left.   IMPRESSION: No acute chest finding or explanation for cough and chest congestion.     Electronically Signed   By: Keith Rake M.D.   On: 05/06/2021 11:24  Bronchitis, chronic cough- Plan: Ambulatory referral to Pulmonology, DG Chest 2 View, Tiotropium Bromide-Olodaterol (STIOLTO RESPIMAT) 2.5-2.5 MCG/ACT AERS, AMB Referral to [place] catie pharm d showed how to do this inhaler today given 1 2 week supply inhaler to use with prn albuterol   Abdominal pain, unspecified abdominal location - Plan: Ambulatory referral to Obstetrics / Gynecolog ywill need pelvic exam -->asked pt if she wanted to do CT ab/pelvis with contarst declines for now will call back when ready Pelvic pain - Plan: Ambulatory referral to Urogynecology, Ambulatory referral to Obstetrics / Gynecology,  Urinary incontinence, unspecified type - Plan: Ambulatory referral to Obstetrics / Gynecology Warts, genital - Plan: Ambulatory referral to Urogynecology, Ambulatory referral to Obstetrics / Gynecology,   Hypertension on benicar 10 mg bid Controlled   HM Flu shot consider 2022  Tdap -had TD 10/18/12 not pertussis will need this in  future  shingrix will need if has not had  zostervax had 06/16/11  prevnar had 05/15/14  covid 4/4 vaccines utd pfizer covid + 02/04/21 and 04/05/21-04/20/21  Pna 23 had 12/16/05, 05/18/16    Pap out of age window LMP 68s both ovaries and Fts removed 2008 -referred 05/05/21 [facility] female pelvic pain h/o genital warts   mammo [name] with implants 01/21/20 normal  -10/18/16 screening with implants neg FH  breast cancer  p cousins dx'ed in 22s x 3, sister  Had at age 40 y.o and colon cancer age 35, 2 paternal cousins ovarian cancer age 72 and 29  01/21/20 negative ordered [facility] for 01/20/21 and scheduled and neg    Colonoscopy 11/2012 tortuous colon no further rec.  -she is established and f/u GI Dr. Farrel Gordon [place] virtual coloscopy had 02/25/20 -leb  02/25/20 CT colonoscopy-leb GI   IMPRESSION: 1. Suboptimal evaluation of the sigmoid, secondary to underdistention. No circumferential mass within this region. More proximally, no evidence of clinically significant colonic polyp or mass. 2. Small hiatal hernia. 3. Aortic Atherosclerosis (ICD10-I70.0).     DEXA Sadie Haber PCP 06/28/16 normal reviewed report did not scan into chart -h/o vitamin D def on D3 2000 IU daily     [facility]atology" [facility] Dr. Delman Cheadle mohs surgery  F/u from 04/10/19 left lower leg cellulitis for SCC changed to levaquin rec compression socks and acidic acid soaks qhs   05/15/19 f/u appt s/p SCC mohs removal f/u in 3 months Dr. Winifred [facility]  F/u with Dr. Delman Cheadle derm and [facility] mohs as scheduled freq f/us   Echo 01/07/20  FINDINGS   Left Ventricle: Left ventricular ejection fraction, by estimation, is 60  to 65%. The left ventricle has normal function. The left ventricle has no  regional wall motion abnormalities. The left ventricular internal cavity  size was normal in size. There is   borderline left ventricular hypertrophy. Left ventricular diastolic  parameters are consistent with age-related delayed relaxation (normal).   Right Ventricle: The right ventricular size is normal. No increase in  right ventricular wall thickness. Right ventricular systolic function is  normal. There is normal pulmonary artery systolic pressure. The tricuspid  regurgitant velocity is 2.46 m/s, and   with an assumed right atrial pressure of 3 mmHg, the estimated right  ventricular systolic pressure is 22.2 mmHg.    Left Atrium: Left atrial size was normal in size.   Right Atrium: Right atrial size was normal in size.   Pericardium: There is no evidence of pericardial effusion.   Mitral Valve: The mitral valve is degenerative in appearance. There is  mild thickening of the mitral valve leaflet(s). Mild mitral valve  regurgitation. No evidence of mitral valve stenosis. MV peak gradient, 3.7  mmHg. The mean mitral valve gradient is  1.0 mmHg.   Tricuspid Valve: The tricuspid valve is normal in structure. Tricuspid  valve regurgitation is mild to moderate.   Aortic Valve: The aortic valve is tricuspid. Aortic valve regurgitation is  not visualized. No aortic stenosis is present. Aortic valve mean gradient  measures 4.0 mmHg. Aortic valve peak gradient measures 9.1 mmHg. Aortic  valve area, by VTI measures 2.19  cm.   Pulmonic Valve: The pulmonic valve was not well visualized. Pulmonic valve  regurgitation is mild.   Aorta: The aortic root is normal in size and structure.   Pulmonary Artery: The pulmonary artery is not well seen.   Venous: The inferior vena cava is normal in  size with greater than 50%  respiratory variability, suggesting right atrial pressure of 3 mmHg.   IAS/Shunts: The interatrial septum was not well visualized.   Specialists  [facility] saw 01/01/19 increased nocturia and urgency pending Botox to tx and UDS Dr. Junious Silk and try myrbetriq before UDS    Cards-Dr. Rockey Situ leb GI Leb GI Dr. Moss Mc MD Dr. Cordelia Pen [facility]-retinal specialist Dentist Dr. Lynelle Smoke Neurology-Leb Neurology Dr. Posey Pronto  ENT-Dr. Tami Ribas for vestibular balance issue s Podiatry Dr. Milinda Pointer H/o Dr. Earlie Server [facility] Lung-Dr. Patsey Berthold , Leb in [place] Ortho Dr. Alvan Dame Surgery Dr.Toth Ob/gyn-physicians for [facility]   Dermatology Dr. Delman Cheadle in [place] h/o [facility] left leg and MM right leg, neck and back with node dissection Dr. Marlou Starks 8/12    Former PCP Dr. Jossie Ng -pt brought in all records week of 09/21/2018 and  reviewed -h/o iron def anemia -last seen 07/09/18 and 08/09/18 possible thrush and URI   US carotid 05/13/16 <50% stenosis in right and left ICA -she was at this time put on lipitor 10 mg qhs   CT ab/pelvis 07/26/17 left base scarring lung, well circumcised liver lesion likely cysts and stable hepatomegaly 19.4 cm cranlocaudal pancreas/spleen normal, extensive colonic diverticulosis, aortic and branch vessell atherosclerosis no adenopathy, normal uterus and adnexa, advanced LS spondylosis grade 1 L4/5 and L5/S1 anterolisthesis are similar, pelvic floor laxity, hiatal hernia CXR 02/26/18 thoracic aorta mildly atherosclerotic unchanged, Deg changes T spine, Deg changes b/l shoulders prior right shoulder surgery calcification distal left supraspinatus tendon at its insertion on the great tuberosity of the left humerus  Provider: Dr. Olivia Mackie McLean-Scocuzza-Internal Medicine

## 2021-05-10 ENCOUNTER — Telehealth: Payer: Self-pay

## 2021-05-10 NOTE — Chronic Care Management (AMB) (Signed)
  Chronic Care Management   Note  05/10/2021 Name: [name] MRN: [id number] DOB: Jun 05, 1941  [name] is a 80 y.o. year old female who is a primary care patient of McLean-Scocuzza, Nino Glow, MD. I reached out to Riley Churches by phone today in response to a referral sent by [name] PCP.  [name] was given information about Chronic Care Management services today including:  CCM service includes personalized support from designated clinical staff supervised by her physician, including individualized plan of care and coordination with other care providers 24/7 contact phone numbers for assistance for urgent and routine care needs. Service will only be billed when office clinical staff spend 20 minutes or more in a month to coordinate care. Only one practitioner may furnish and bill the service in a calendar month. The patient may stop CCM services at any time (effective at the end of the month) by phone call to the office staff. The patient is responsible for co-pay (up to 20% after annual deductible is met) if co-pay is required by the individual health plan.   Patient agreed to services and verbal consent obtained.   Follow up plan: Telephone appointment with care management team member scheduled for:06/23/2021  Noreene Larsson, [address], [place], [place] [address] Direct Dial: [phone] [email or web address]: [email or web address]

## 2021-05-12 ENCOUNTER — Telehealth: Payer: Self-pay | Admitting: Internal Medicine

## 2021-05-12 NOTE — Telephone Encounter (Signed)
Okay to place new referral? 

## 2021-05-12 NOTE — Telephone Encounter (Signed)
-----   Message from Ashley Jacobs sent at 05/12/2021 12:21 PM EDT ----- Regarding: referral Good afternoon!  Elmo Putt from [facility] urogyn called, read below:  Spoke with pt and she informed our dept that she will postpone this request to a later time. She would like to be seen for urinary incontinence concerns but will call back to establish an appt. Pt states she has a general gynecology concern that is more urgent and she would like to see general gynecology prior to proceeding with treatment for urinary incontinence. Please review  Pt will need a new referral to see GYN for the Dx of A63.0 (ICD-10-CM) - Warts, genital R10.2 (ICD-10-CM) - Pelvic pain  If you would like you can change the Dx on the referral for the urinary incontinence that way they'll have that referral.  Please advise and Thank you!

## 2021-05-14 DIAGNOSIS — M25561 Pain in right knee: Secondary | ICD-10-CM | POA: Diagnosis not present

## 2021-05-17 ENCOUNTER — Telehealth: Payer: Self-pay | Admitting: Internal Medicine

## 2021-05-17 ENCOUNTER — Encounter: Payer: Self-pay | Admitting: Internal Medicine

## 2021-05-17 DIAGNOSIS — R32 Unspecified urinary incontinence: Secondary | ICD-10-CM | POA: Insufficient documentation

## 2021-05-17 NOTE — Telephone Encounter (Signed)
Referral placed inform pt please  Dr. Kelly Services

## 2021-05-17 NOTE — Telephone Encounter (Signed)
Patient dropped off some paper work for Dr Olivia Mackie to sign. Paper work is up front in Dr [employer].

## 2021-05-18 NOTE — Telephone Encounter (Signed)
Left message to return call 

## 2021-05-19 NOTE — Telephone Encounter (Signed)
Placed on your desk in [place] folder. Looks to be single form with attached paperwork for Patient to receive reimbursement for cancelling her trip due to being sick. Patient was seen in October 2022.   Note attached to paperwork asking to also forward to Dr Edilia Bo if needed as he also saw the Patient for her having COVID 19.

## 2021-05-20 DIAGNOSIS — M79604 Pain in right leg: Secondary | ICD-10-CM | POA: Diagnosis not present

## 2021-05-20 DIAGNOSIS — Z872 Personal history of diseases of the skin and subcutaneous tissue: Secondary | ICD-10-CM | POA: Diagnosis not present

## 2021-05-20 NOTE — Telephone Encounter (Signed)
Lm on vm for patient to return call to schedule a follow up appt with Dr. Havery Moros.

## 2021-05-20 NOTE — Telephone Encounter (Signed)
No referral needed she can call [facility] for an appointment

## 2021-05-20 NOTE — Telephone Encounter (Signed)
Patient wanting another referral placed for a colonoscopy to Dr Havery Moros due to diarrhea.   Okay to place?

## 2021-05-20 NOTE — Telephone Encounter (Signed)
Patient informed and verbalized understanding

## 2021-05-20 NOTE — Telephone Encounter (Signed)
I'm happy to see her back. [place] can you help coordinate a follow up in the office with me? Thanks

## 2021-05-20 NOTE — Telephone Encounter (Signed)
Noted, Patient will be contacted by [place] office.

## 2021-05-21 NOTE — Telephone Encounter (Signed)
Lm on vm for patient to return call to schedule a follow up with Dr. Havery Moros.

## 2021-05-24 NOTE — Telephone Encounter (Signed)
Spoke with patient, she has been scheduled for a follow up with Dr. Havery Moros on Thursday, 05/27/21 at 3:20 PM. Patient is aware that she will check in on the 3rd floor. Pt verbalized understanding and had no concerns at the end of the call.

## 2021-05-24 NOTE — Progress Notes (Signed)
Patient scheduled for follow up.

## 2021-05-26 NOTE — Telephone Encounter (Signed)
Patient anxious to get paperwork back. She stated she is returning your call.

## 2021-05-27 ENCOUNTER — Encounter: Payer: Self-pay | Admitting: Gastroenterology

## 2021-05-27 ENCOUNTER — Ambulatory Visit (INDEPENDENT_AMBULATORY_CARE_PROVIDER_SITE_OTHER): Payer: PPO | Admitting: Gastroenterology

## 2021-05-27 ENCOUNTER — Other Ambulatory Visit: Payer: PPO

## 2021-05-27 VITALS — HR 62 | Ht 65.98 in | Wt 184.5 lb

## 2021-05-27 DIAGNOSIS — R933 Abnormal findings on diagnostic imaging of other parts of digestive tract: Secondary | ICD-10-CM

## 2021-05-27 DIAGNOSIS — K529 Noninfective gastroenteritis and colitis, unspecified: Secondary | ICD-10-CM

## 2021-05-27 MED ORDER — DICYCLOMINE HCL 10 MG PO CAPS: 10.0000 mg | ORAL_CAPSULE | Freq: Three times a day (TID) | ORAL | 1 refills | Status: DC | PRN

## 2021-05-27 MED ORDER — COLESTIPOL HCL 1 G PO TABS
1.0000 g | ORAL_TABLET | Freq: Two times a day (BID) | ORAL | 3 refills | Status: DC
Start: 2021-05-27 — End: 2021-08-27

## 2021-05-27 MED ORDER — LOPERAMIDE HCL 2 MG PO TABS: 2.0000 mg | ORAL_TABLET | ORAL | 0 refills | Status: DC | PRN

## 2021-05-27 NOTE — Patient Instructions (Addendum)
If you are age 80 or older, your body mass index should be between 23-30. Your Body mass index is 29.8 kg/m. If this is out of the aforementioned range listed, please consider follow up with your Primary Care Provider.  If you are age 9 or younger, your body mass index should be between 19-25. Your Body mass index is 29.8 kg/m. If this is out of the aformentioned range listed, please consider follow up with your Primary Care Provider.   ________________________________________________________  The [facility] providers would like to encourage you to use [facility] to communicate with providers for non-urgent requests or questions.  Due to long hold times on the telephone, sending your provider a message by [facility] may be a faster and more efficient way to get a response.  Please allow 48 business hours for a response.  Please remember that this is for non-urgent requests.  _______________________________________________________  Please go to the lab in the basement of our building to have lab work done as you leave today. Hit "B" for basement when you get on the elevator.  When the doors open the lab is on your left.  We will call you with the results. Thank you.  We have sent the following medications to your pharmacy for you to pick up at your convenience: Colestid 1 g: Take twice a day Please discuss this medication with your pharmacist and determine when it is best to take it in relation to your other medications  Bentyl 10 mg: Take every 8 hours as needed  Please purchase the following medications over the counter and take as directed: Imodium- Take as directed as needed, especially prior to eating out  Thank you for entrusting me with your care and for choosing [employer], Dr. [place] Cellar

## 2021-05-27 NOTE — Telephone Encounter (Signed)
Patient calling to check on status of paperwork

## 2021-05-27 NOTE — Progress Notes (Signed)
HPI :  80 year old female here for follow-up visit for chronic loose stools.  Recall that she had her last colonoscopy in 2014 by [facility].  She had to be admitted to the hospital for pain after the procedure and concern for perforation, she was told she should never have a colonoscopy again.  She is wanting to avoid colonoscopies historically if at all possible.  She has had some intermittent bowel problems in the past, ultimately had a virtual colonoscopy in August 2021.  No obvious polyps or mass lesions noted, she does have suboptimal distention of the sigmoid colon with diverticulosis.  She has had no anemia, no bleeding symptoms recently.  Her main complaint is ongoing loose stools.  She states this has been going on for "long time", at least a few years.  She thinks worse since she had her gallbladder out in 2016.  This has been intermittent but more so frequent since have last seen her.  Some days she will have upwards of 3 loose stools per day.  She has some postprandial urgency, most days of the week.  She eats a lot of beets which can turn her stools red but she does not think she sees any bleeding.  She has some cramping with loose stools when this happens.  She has had some urgency in the past which can lead to accidents but that is not too common.  She remains quite anxious about ever having a colonoscopy, she wants to avoid this if at all possible given her last experience which was extremely stressful for her.  Recall that she had a negative GI pathogen panel, stool lactoferrin, stool for C. difficile in the past year or so.  She has taken Imodium at times for this which she does think helps.  No weight loss.  When the loose stools are severe she can sometimes be nauseated,   Prior work-up  CT scan 07/26/2017 IMPRESSION: 1. No acute process in the abdomen or pelvis. No evidence of metastatic disease in the abdomen or pelvis. 2.  Aortic Atherosclerosis (ICD10-I70.0). 3. Pelvic floor  laxity. 4. Hiatal hernia.    Virtual colonoscopy 02/25/2020 - IMPRESSION: 1. Suboptimal evaluation of the sigmoid, secondary to underdistention. No circumferential mass within this region. More proximally, no evidence of clinically significant colonic polyp or mass. 2. Small hiatal hernia. 3. Aortic Atherosclerosis (ICD10-I70.0).     Past Medical History:  Diagnosis Date   Acid reflux    Allergy    Anemia    Anxiety    in past   Arthritis    all over   Asthma    Atypical moles    Bronchitis    Bronchospasm    reactive with perfume/cologne and smoke   CAD (coronary artery disease)    patient denies on preop of 10/24    Candida esophagitis (HCC)    Carotid artery calcification, bilateral 04/2016   Cataract    Cervical stenosis of spine    Chronic insomnia    Complication of anesthesia    shoulder surgery- not all the way asleep   COVID-19 02/04/2021   also 10/3 or 04/20/21 sxs started 04/05/21   Depression    in past   Diverticulitis    Diverticulosis    Dizziness    Elevated liver enzymes    Family history of adverse reaction to anesthesia    sister- has problems with nausea and vomiting    Fatigue    Fatty liver    Generalized  headache    migraines   GERD (gastroesophageal reflux disease)    History of chemotherapy    topical cream for h/o skin CA   History of kidney stones    History of neuropathy    FH Dr. Posey Pronto neurology   History of vertigo    Hx of migraines    Hyperlipidemia    Hypothyroidism    Insomnia    Irritable bowel syndrome    LLQ abdominal pain    Melanoma ([place]) 2013   Melanoma ([place])    x2 stg II melanoma   Neuropathy, peripheral    Peripheral neuropathy    Pneumonia    hx of x 2   PONV (postoperative nausea and vomiting)    Pre-diabetes    Retina disorder    left   SCC (squamous cell carcinoma)    skin    Stroke ([place])    ? TIA per Dr V per patient    Thyroid disease    Urine incontinence    Vitamin D deficiency    Weight  gain      Past Surgical History:  Procedure Laterality Date   BILATERAL SALPINGOOPHORECTOMY     ovarian cyst Dr. Ouida Sills and Dr. Alisia Ferrari 2008   BREAST SURGERY     in 2014 bx breast calcifications   BREAST SURGERY     augmentation with saline Dr. Hubbard Hartshorn 1995    CHOLECYSTECTOMY N/A 01/28/2015   Procedure: LAPAROSCOPIC CHOLECYSTECTOMY WITH INTRAOPERATIVE CHOLANGIOGRAM;  Surgeon: Autumn Messing III, MD;  Location: [facility];  Service: General;  Laterality: N/A;   CLOSED MANIPULATION SHOULDER     rt   COLONOSCOPY WITH PROPOFOL N/A 11/20/2012   Procedure: COLONOSCOPY WITH PROPOFOL;  Surgeon: Garlan Fair, MD;  Location: [facility];  Service: Endoscopy;  Laterality: N/A;   colonscopy     May 2014   JOINT REPLACEMENT     RTK   KNEE ARTHROSCOPY     left knee miniscus tear repair     MELANOMA EXCISION WITH SENTINEL LYMPH NODE BIOPSY  03/18/11   Back, nodes neg   OOPHORECTOMY     ROTATOR CUFF REPAIR     left   SHOULDER SURGERY     right and left    SKIN CANCER EXCISION     multiple   TOTAL HIP ARTHROPLASTY     TOTAL KNEE ARTHROPLASTY     right 2010 and left in 2018    [facility] Left 05/16/2017   Procedure: LEFT TOTAL KNEE ARTHROPLASTY;  Surgeon: Paralee Cancel, MD;  Location: [facility];  Service: Orthopedics;  Laterality: Left;  70 mins   TUBAL LIGATION     TUMOR REMOVAL     ovary   Family History  Problem Relation Age of Onset   Hypertension Mother    Stroke Mother    Hyperlipidemia Mother    Heart disease Father    Diabetes Father    Hypertension Father    Hyperlipidemia Father    Early death Father    Diabetes Sister    Heart disease Sister        heart murmur   Arthritis Sister    COPD Sister    Hyperlipidemia Sister    Hypertension Sister    Colon polyps Sister        adenomous   Arthritis Sister    Heart disease Sister    Hyperlipidemia Sister    Hypertension Sister    Cancer Sister    Pancreatic cancer Maternal  Uncle    Alzheimer's disease  Paternal Aunt    Alzheimer's disease Paternal Aunt    Alzheimer's disease Paternal Aunt    Alzheimer's disease Paternal Aunt    Stroke Maternal Grandmother    Colon cancer Maternal Grandfather    Cancer Maternal Grandfather    Early death Maternal Grandfather    Early death Paternal Grandmother    Stroke Paternal Grandmother    Heart disease Daughter    Hyperlipidemia Daughter    Hypertension Daughter    Heart disease Son    Hyperlipidemia Son    Alcohol abuse Son    Depression Son    Diabetes Son    Cancer Other        2 paternal uncles and 1 maternal uncle colon cancer   Esophageal cancer Neg Hx    Stomach cancer Neg Hx    Social History   Tobacco Use   Smoking status: Never   Smokeless tobacco: Never  Vaping Use   Vaping Use: Never used  Substance Use Topics   Alcohol use: Yes    Alcohol/week: 1.0 standard drink    Types: 1 Cans of beer per week    Comment: once a week beer or wine   Drug use: No   Current Outpatient Medications  Medication Sig Dispense Refill   albuterol (PROAIR HFA) 108 (90 Base) MCG/ACT inhaler Inhale 1-2 puffs into the lungs every 6 (six) hours as needed for wheezing or shortness of breath. 18 g 11   aspirin 81 MG tablet TAKE 1 TABLET BY MOUTH EVERY DAY     atorvastatin (LIPITOR) 20 MG tablet Take 1 tablet (20 mg total) by mouth daily. 90 tablet 3   Calcium 600-200 MG-UNIT tablet Take 1 tablet daily by mouth.     Cholecalciferol (VITAMIN D3) 2000 UNITS capsule Take 2,000 Units by mouth daily.     doxycycline (VIBRA-TABS) 100 MG tablet Take 1 tablet (100 mg total) by mouth 2 (two) times daily. 14 tablet 0   FOLIC ACID PO Take by mouth daily.     levothyroxine (SYNTHROID) 75 MCG tablet Take 1 tablet (75 mcg total) by mouth daily before breakfast. 90 tablet 3   olmesartan (BENICAR) 5 MG tablet Take 2 tablets (10 mg total) by mouth in the morning and at bedtime. 360 tablet 1   Omega-3 Fatty Acids (FISH OIL) 1000 MG CAPS Take 1,000 mg by mouth daily.       omeprazole (PRILOSEC) 20 MG capsule Take 1 capsule (20 mg total) by mouth 2 (two) times daily before a meal. 60 capsule 11   sertraline (ZOLOFT) 25 MG tablet Take 1 tablet (25 mg total) by mouth daily. 90 tablet 3   Tiotropium Bromide-Olodaterol (STIOLTO RESPIMAT) 2.5-2.5 MCG/ACT AERS Inhale 2 puffs into the lungs daily. Medication Samples have been provided to the patient.  Drug name: stiolto      Strength: 2.5/2.5        Qty: 1 LOT: [id number]  Exp.Date: AUG 2023  The patient has been instructed regarding the correct time, dose, and frequency of taking this medication, including desired effects and most common side effects. 1 each 0   vitamin B-12 (CYANOCOBALAMIN) 1000 MCG tablet Take 2,000 mcg by mouth daily. 2 tabs     No current facility-administered medications for this visit.   Allergies  Allergen Reactions   Epinephrine Other (See Comments) and Anaphylaxis    Heart palpitations   Penicillins Swelling and Other (See Comments)    Lip swelling Has  patient had a PCN reaction causing immediate rash, facial/tongue/throat swelling, SOB or lightheadedness with hypotension: Yes Has patient had a PCN reaction causing severe rash involving mucus membranes or skin necrosis: No Has patient had a PCN reaction that required hospitalization No Has patient had a PCN reaction occurring within the last 10 years: No If all of the above answers are "NO", then may proceed with Cephalosporin use.   Lidocaine Hcl Other (See Comments)   Other     Black pepper- caused bronchial tubes to start closing    Valium [Diazepam] Other (See Comments)    Other reaction(s): Other (See Comments) Overly sensitive  sedation     Review of Systems: All systems reviewed and negative except where noted in HPI.   Lab Results  Component Value Date   WBC 5.3 02/24/2021   HGB 13.7 02/24/2021   HCT 41.2 02/24/2021   MCV 91.2 02/24/2021   PLT 176 02/24/2021    Lab Results  Component Value Date   ALT 11  09/08/2020   AST 13 09/08/2020   ALKPHOS 68 09/08/2020   BILITOT 1.2 09/08/2020    Lab Results  Component Value Date   CREATININE 0.97 03/30/2021   BUN 21 03/30/2021   NA 140 03/30/2021   K 4.4 03/30/2021   CL 106 03/30/2021   CO2 25 03/30/2021     Physical Exam: Pulse 62   Ht 5' 5.98" (1.676 m)   Wt 184 lb 8 oz (83.7 kg)   SpO2 97%   BMI 29.80 kg/m  Constitutional: Pleasant,well-developed, female in no acute distress. Neurological: Alert and oriented to person place and time. Psychiatric: Normal mood and affect. Behavior is normal.   ASSESSMENT AND PLAN: 80 year old female here for reassessment of the following issues:  Chronic diarrhea Abnormal colonoscopy  Patient has a history of cholecystectomy in 2016.  She has had chronic intermittent loose stools over time which appear worsening recently.  Recall she has had an incomplete colonoscopy in 2014, had retained air with hospitalization, very unpleasant experience and she does not want any further optical colonoscopies.  We did a virtual colonoscopy for her last year which did not show any significant pathology although sigmoid colon was under distended which can be a limitation of the exam.  She has no anemia.  She has not wished to pursue any additional endoscopic evaluation.  Her loose stools are becoming a bit more frequent with some urgency.  She has had negative stool test in the past, negative for lactoferrin and infection.  We discussed options.  I think trial of Colestid 1 g twice daily is reasonable given her postcholecystectomy state see if that will help.  Also give her some Bentyl to use as needed for abdominal cramping.  She can use Imodium on top of this if needed, she is responded well to that in the past.  I will screen her for celiac disease with labs to make sure negative otherwise.  We will see how she does with this over the next few weeks, if symptoms persist she should call me for reassessment and discussion  of other options  Plan: - start colestid 1gm BID - informed her she cannot take this with Synthroid and needs to discuss timing of ingestion of this in relation to her other medications with her pharmacist - trial of Bentyl 10 mg every 8 hours as needed - immodium PRN  - labs for celiac serologies - patient does not want any colonoscopy exams as above - follow  up as needed if symptoms persist. I asked her to update me in a few weeks.  Jolly Mango, MD [facility]

## 2021-05-28 LAB — IGA: Immunoglobulin A: 103 mg/dL (ref 70–320)

## 2021-05-28 LAB — TISSUE TRANSGLUTAMINASE, IGA: (tTG) Ab, IgA: <1 U/mL

## 2021-05-31 NOTE — Telephone Encounter (Signed)
Will do 06/01/21 and give back to you to give to patient have the paperwork on my desk  Ask me about it 06/01/21

## 2021-06-01 ENCOUNTER — Telehealth: Payer: Self-pay

## 2021-06-01 NOTE — Telephone Encounter (Signed)
Called and spoke to [place] and informed her that she can pick up the reimbursement form. Pt verbalized understanding and states that she will come to the office tomorrow on 06/02/21. Form is on Arianna's desk.

## 2021-06-02 ENCOUNTER — Encounter: Payer: Self-pay | Admitting: Pulmonary Disease

## 2021-06-02 NOTE — Telephone Encounter (Signed)
Left message to return call.  Okay to inform that paperwork is ready and placed upfront

## 2021-06-02 NOTE — Telephone Encounter (Signed)
Patient is aware 

## 2021-06-02 NOTE — Telephone Encounter (Signed)
Patient calling back in and informed by the front desk

## 2021-06-02 NOTE — Telephone Encounter (Signed)
Noted  

## 2021-06-14 ENCOUNTER — Telehealth: Payer: Self-pay | Admitting: Internal Medicine

## 2021-06-14 NOTE — Telephone Encounter (Signed)
Patient wants to know when she can take the flu shot and the new Covid booster.

## 2021-06-14 NOTE — Telephone Encounter (Signed)
Patient tested positive for Covid in 02/04/21, scanned in to chart.  Patient was sick again in September and having latent symptoms in 04/2021.   Please advise on when Patient can have COVID and Flu shots

## 2021-06-14 NOTE — Telephone Encounter (Signed)
You can get flu shot now if better  Covid shot middle 07/2021 at pharmacy

## 2021-06-15 ENCOUNTER — Other Ambulatory Visit: Payer: Self-pay | Admitting: Internal Medicine

## 2021-06-15 DIAGNOSIS — E039 Hypothyroidism, unspecified: Secondary | ICD-10-CM

## 2021-06-15 MED ORDER — LEVOTHYROXINE SODIUM 75 MCG PO TABS: 75.0000 ug | ORAL_TABLET | Freq: Every day | ORAL | 3 refills | Status: DC

## 2021-06-17 NOTE — Telephone Encounter (Signed)
Patient called and note read. Patient understood and will go to pharmacy.

## 2021-06-18 ENCOUNTER — Telehealth: Payer: Self-pay

## 2021-06-18 DIAGNOSIS — E039 Hypothyroidism, unspecified: Secondary | ICD-10-CM

## 2021-06-18 MED ORDER — LEVOTHYROXINE SODIUM 75 MCG PO TABS: 75.0000 ug | ORAL_TABLET | Freq: Every day | ORAL | 3 refills | Status: DC

## 2021-06-22 NOTE — Telephone Encounter (Signed)
Pt called in stating that she spoke with brock about the change in vendors for medication (levothyroxine (SYNTHROID) 75 MCG tablet). Pt stated that the pharmacy is waiting on approval from doctor to refill prescription for medication for new vendor. Pt requesting callback with update that approval have been sent over to [place] at [place]

## 2021-06-23 ENCOUNTER — Telehealth: Payer: PPO

## 2021-06-24 ENCOUNTER — Ambulatory Visit (INDEPENDENT_AMBULATORY_CARE_PROVIDER_SITE_OTHER): Payer: PPO

## 2021-06-24 ENCOUNTER — Other Ambulatory Visit: Payer: Self-pay

## 2021-06-24 DIAGNOSIS — Z23 Encounter for immunization: Secondary | ICD-10-CM | POA: Diagnosis not present

## 2021-06-28 ENCOUNTER — Telehealth: Payer: Self-pay | Admitting: Pharmacist

## 2021-06-28 ENCOUNTER — Telehealth: Payer: PPO

## 2021-06-28 NOTE — Telephone Encounter (Signed)
  Chronic Care Management   Note  06/28/2021 Name: SEPTEMBER MORMILE MRN: [id number] DOB: 10/16/1940  Called patient for CCM visit. She notes that she is no longer taking Stiolto due to lack of benefit and denies any other medication concerns today.   Closing CCM case. Advised patient to reach out if any future medication questions or concerns.  Catie Darnelle Maffucci, PharmD, [place], [place] Clinical Pharmacist [employer] at [employer] [phone]

## 2021-07-01 ENCOUNTER — Telehealth: Payer: Self-pay | Admitting: Internal Medicine

## 2021-07-01 NOTE — Telephone Encounter (Signed)
She can call [facility] when ready for appt and schedule  Inform pt

## 2021-07-01 NOTE — Telephone Encounter (Signed)
Rejection Reason - Other - PT CANCELLED AND DID NOT RESCHEDULE" Dorise Bullion said on Jun 29, 2021 12:22 PM  Msg from [facility]

## 2021-07-06 NOTE — Telephone Encounter (Signed)
Letter mailed with information and note to call.

## 2021-07-08 NOTE — Telephone Encounter (Signed)
This was faxed

## 2021-07-19 DIAGNOSIS — Z03818 Encounter for observation for suspected exposure to other biological agents ruled out: Secondary | ICD-10-CM | POA: Diagnosis not present

## 2021-07-19 DIAGNOSIS — Z20822 Contact with and (suspected) exposure to covid-19: Secondary | ICD-10-CM | POA: Diagnosis not present

## 2021-07-29 ENCOUNTER — Telehealth: Payer: Self-pay | Admitting: Internal Medicine

## 2021-07-29 NOTE — Telephone Encounter (Signed)
Pt called in wanting to know the name of the counselor that Dr. Olivia Mackie told her about a year ago for her daughter. Pt stated the counselor was in [place] or [place]

## 2021-07-29 NOTE — Telephone Encounter (Signed)
[facility] [phone]

## 2021-07-29 NOTE — Telephone Encounter (Signed)
Please advise 

## 2021-08-02 NOTE — Telephone Encounter (Signed)
Patient informed and verbalized understanding. States she has nothing to write with and would like me to leave the information on her voicemail. Done

## 2021-08-04 NOTE — Telephone Encounter (Signed)
Pt called in regards to previous messages. Pt states Dr. Olivia Mackie recommended someone specifically for [facility]. Pt was advised Dr. Olivia Mackie isn't recommending a certain provider due to the fact that she will take a survey and they will match her to the provider who is the best fit. Pt gave verbal understanding.

## 2021-08-11 ENCOUNTER — Ambulatory Visit: Payer: PPO

## 2021-08-11 ENCOUNTER — Telehealth: Payer: Self-pay

## 2021-08-11 NOTE — Telephone Encounter (Signed)
No answer when called for scheduled AWV. Left message to reschedule.  ?

## 2021-08-19 DIAGNOSIS — H9201 Otalgia, right ear: Secondary | ICD-10-CM | POA: Diagnosis not present

## 2021-08-24 DIAGNOSIS — Z09 Encounter for follow-up examination after completed treatment for conditions other than malignant neoplasm: Secondary | ICD-10-CM | POA: Diagnosis not present

## 2021-08-24 DIAGNOSIS — Z8669 Personal history of other diseases of the nervous system and sense organs: Secondary | ICD-10-CM | POA: Diagnosis not present

## 2021-08-27 ENCOUNTER — Ambulatory Visit: Payer: PPO | Admitting: Cardiovascular Disease

## 2021-08-27 ENCOUNTER — Encounter: Payer: Self-pay | Admitting: Cardiovascular Disease

## 2021-08-27 ENCOUNTER — Other Ambulatory Visit: Payer: Self-pay

## 2021-08-27 VITALS — BP 129/64 | HR 72 | Ht 66.0 in | Wt 189.0 lb

## 2021-08-27 DIAGNOSIS — I10 Essential (primary) hypertension: Secondary | ICD-10-CM | POA: Diagnosis not present

## 2021-08-27 DIAGNOSIS — I25118 Atherosclerotic heart disease of native coronary artery with other forms of angina pectoris: Secondary | ICD-10-CM

## 2021-08-27 DIAGNOSIS — I700 Atherosclerosis of aorta: Secondary | ICD-10-CM

## 2021-08-27 DIAGNOSIS — I739 Peripheral vascular disease, unspecified: Secondary | ICD-10-CM

## 2021-08-27 NOTE — Progress Notes (Addendum)
Cardiology Office Note:    Date:  08/27/2021   ID:  VANDELLA ORD, DOB 1940/10/23, MRN [id number]  PCP:  McLean-Scocuzza, Nino Glow, MD  [facility] Cardiologist:  None  [facility] Electrophysiologist:  None   Referring MD: McLean-Scocuzza, Olivia Mackie *   Cc: Fatigue, stress  History of Present Illness:    [name] is a 81 y.o. Female with hx of  CAD and aortic athero on CT scan Chronic dizziness Mild carotid dz Hyperlipidemia obesity, deconditioning Syncope Scarring in lung apices on CT Chronic SOB Who presents for  coronary disease, edema, chronic stable angina, shortness of breath, pulmonary fibrosis  LOV April 2022 On that visit sedentary, chronic shortness of breath, lightheaded, blood pressure low at times Lymphedema, Amlodipine was held BP well controlled  Poor balance  Today, stress in family, illness No leg swelling  labs LDL 51 A1C 6.2  EKG personally reviewed by myself on todays visit NSR rate rate 72 no; ST or T wave change  Prior symptoms shortness of breath Stress test 02/2020 Normal echo 12/2019  CT scan in July 2020   pulm scarring fibrosis in the apices Followed by pulmonary,  airway reactivity  CT chest 01/2019 Aortic Atherosclerosis  Diffuse coronary artery calcifications, particularly dense within the left anterior descending coronary artery   Echocardiogram performed January 07, 2020 Normal ejection fraction, normal right heart pressures, normal RV function Mild to moderately TR  Echo in 2017: normal EF, normal RVSP  CT ABD: mild A athero There is 3 vessel coronary calcification  2017 and 2018    Past Medical History:  Diagnosis Date   Acid reflux    Allergy    Anemia    Anxiety    in past   Arthritis    all over   Asthma    Atypical moles    Bronchitis    Bronchospasm    reactive with perfume/cologne and smoke   CAD (coronary artery disease)    patient denies on preop of 10/24    Candida esophagitis (HCC)     Carotid artery calcification, bilateral 04/2016   Cataract    Cervical stenosis of spine    Chronic insomnia    Complication of anesthesia    shoulder surgery- not all the way asleep   COVID-19 02/04/2021   also 10/3 or 04/20/21 sxs started 04/05/21   Depression    in past   Diverticulitis    Diverticulosis    Dizziness    Elevated liver enzymes    Family history of adverse reaction to anesthesia    sister- has problems with nausea and vomiting    Fatigue    Fatty liver    Generalized headache    migraines   GERD (gastroesophageal reflux disease)    History of chemotherapy    topical cream for h/o skin CA   History of kidney stones    History of neuropathy    FH Dr. Posey Pronto neurology   History of vertigo    Hx of migraines    Hyperlipidemia    Hypothyroidism    Insomnia    Irritable bowel syndrome    LLQ abdominal pain    Melanoma ([place]) 2013   Melanoma ([place])    x2 stg II melanoma   Neuropathy, peripheral    Peripheral neuropathy    Pneumonia    hx of x 2   PONV (postoperative nausea and vomiting)    Pre-diabetes    Retina disorder    left  SCC (squamous cell carcinoma)    skin    Stroke ([place])    ? TIA per Dr V per patient    Thyroid disease    Urine incontinence    Vitamin D deficiency    Weight gain     Past Surgical History:  Procedure Laterality Date   BILATERAL SALPINGOOPHORECTOMY     ovarian cyst Dr. Ouida Sills and Dr. Alisia Ferrari 2008   BREAST SURGERY     in 2014 bx breast calcifications   BREAST SURGERY     augmentation with saline Dr. Hubbard Hartshorn 1995    CHOLECYSTECTOMY N/A 01/28/2015   Procedure: LAPAROSCOPIC CHOLECYSTECTOMY WITH INTRAOPERATIVE CHOLANGIOGRAM;  Surgeon: Autumn Messing III, MD;  Location: [facility];  Service: General;  Laterality: N/A;   CLOSED MANIPULATION SHOULDER     rt   COLONOSCOPY WITH PROPOFOL N/A 11/20/2012   Procedure: COLONOSCOPY WITH PROPOFOL;  Surgeon: Garlan Fair, MD;  Location: [facility];  Service: Endoscopy;  Laterality:  N/A;   colonscopy     May 2014   JOINT REPLACEMENT     RTK   KNEE ARTHROSCOPY     left knee miniscus tear repair     MELANOMA EXCISION WITH SENTINEL LYMPH NODE BIOPSY  03/18/11   Back, nodes neg   OOPHORECTOMY     ROTATOR CUFF REPAIR     left   SHOULDER SURGERY     right and left    SKIN CANCER EXCISION     multiple   TOTAL HIP ARTHROPLASTY     TOTAL KNEE ARTHROPLASTY     right 2010 and left in 2018    [place] Left 05/16/2017   Procedure: LEFT TOTAL KNEE ARTHROPLASTY;  Surgeon: Paralee Cancel, MD;  Location: [facility];  Service: Orthopedics;  Laterality: Left;  70 mins   TUBAL LIGATION     TUMOR REMOVAL     ovary    Current Medications: Current Outpatient Medications on File Prior to Visit  Medication Sig Dispense Refill   albuterol (PROAIR HFA) 108 (90 Base) MCG/ACT inhaler Inhale 1-2 puffs into the lungs every 6 (six) hours as needed for wheezing or shortness of breath. 18 g 11   aspirin 81 MG tablet TAKE 1 TABLET BY MOUTH EVERY DAY     atorvastatin (LIPITOR) 20 MG tablet Take 1 tablet (20 mg total) by mouth daily. 90 tablet 3   Calcium 600-200 MG-UNIT tablet Take 1 tablet daily by mouth.     Cholecalciferol (VITAMIN D3) 2000 UNITS capsule Take 2,000 Units by mouth daily.     FOLIC ACID PO Take by mouth daily.     levothyroxine (SYNTHROID) 75 MCG tablet Take 1 tablet (75 mcg total) by mouth daily before breakfast. 90 tablet 3   loperamide (IMODIUM A-D) 2 MG tablet Take 1 tablet (2 mg total) by mouth as needed for diarrhea or loose stools. 30 tablet 0   olmesartan (BENICAR) 5 MG tablet Take 2 tablets (10 mg total) by mouth in the morning and at bedtime. 360 tablet 1   Omega-3 Fatty Acids (FISH OIL) 1000 MG CAPS Take 1,000 mg by mouth daily.      omeprazole (PRILOSEC) 20 MG capsule Take 1 capsule (20 mg total) by mouth 2 (two) times daily before a meal. 60 capsule 11   sertraline (ZOLOFT) 25 MG tablet Take 1 tablet (25 mg total) by mouth daily. 90 tablet 3    vitamin B-12 (CYANOCOBALAMIN) 1000 MCG tablet Take 2,000 mcg by mouth daily. 2 tabs  No current facility-administered medications on file prior to visit.     Allergies:   Epinephrine, Penicillins, Lidocaine hcl, Other, and Valium [diazepam]   Social History   Socioeconomic History   Marital status: Divorced    Spouse name: Not on file   Number of children: 3   Years of education: College   Highest education level: Some college, no degree  Occupational History   Occupation: Retired    Fish farm manager: RETIRED  Tobacco Use   Smoking status: Never   Smokeless tobacco: Never  Vaping Use   Vaping Use: Never used  Substance and Sexual Activity   Alcohol use: Yes    Alcohol/week: 1.0 standard drink    Types: 1 Cans of beer per week    Comment: once a week beer or wine   Drug use: No   Sexual activity: Not Currently  Other Topics Concern   Not on file  Social History Narrative   Patient lives at home alone in a one story home.   Has 3 children (2 daughters and 1 son)    Retired from [employer]   Divorced since 1992    Caffeine Use: 1 cup daily and a soft drink occasionally    No guns    Wears seat belt     Lives with cat    Left handed   Social Determinants of Health   Financial Resource Strain: Not on file  Food Insecurity: Not on file  Transportation Needs: Not on file  Physical Activity: Not on file  Stress: Not on file  Social Connections: Not on file     Family History: The patient's family history includes Alcohol abuse in her son; Alzheimer's disease in her paternal aunt, paternal aunt, paternal aunt, and paternal aunt; Arthritis in her sister and sister; COPD in her sister; Cancer in her maternal grandfather, sister, and another family member; Colon cancer in her maternal grandfather; Colon polyps in her sister; Depression in her son; Diabetes in her father, sister, and son; Early death in her father, maternal grandfather, and paternal grandmother;  Heart disease in her daughter, father, sister, sister, and son; Hyperlipidemia in her daughter, father, mother, sister, sister, and son; Hypertension in her daughter, father, mother, sister, and sister; Pancreatic cancer in her maternal uncle; Stroke in her maternal grandmother, mother, and paternal grandmother. There is no history of Esophageal cancer or Stomach cancer.  ROS:   Please see the history of present illness.    Review of Systems  Constitutional: Negative.   HENT: Negative.    Respiratory:  Positive for shortness of breath.   Cardiovascular: Negative.   Gastrointestinal: Negative.   Musculoskeletal: Negative.   Neurological: Negative.   Psychiatric/Behavioral: Negative.    All other systems reviewed and are negative.  EKGs/Labs/Other Studies Reviewed:    The following studies were reviewed today:  Echo 12/2019 1. Left ventricular ejection fraction, by estimation, is 60 to 65%. The  left ventricle has normal function. The left ventricle has no regional  wall motion abnormalities. Left ventricular diastolic parameters are  consistent with age-related delayed  relaxation (normal).   2. Right ventricular systolic function is normal. The right ventricular  size is normal. There is normal pulmonary artery systolic pressure.   3. The mitral valve is degenerative. Mild mitral valve regurgitation. No  evidence of mitral stenosis.   4. Tricuspid valve regurgitation is mild to moderate.   5. The aortic valve is tricuspid. Aortic valve regurgitation is not  visualized. No aortic  stenosis is present.   6. The inferior vena cava is normal in size with greater than 50%  respiratory variability, suggesting right atrial pressure of 3 mmHg.   EKG:  EKG is  ordered today.  The ekg ordered today demonstrates normal sinus rhythm rate 59 bpm no significant ST-T wave changes  Recent Labs: 09/08/2020: ALT 11 12/22/2020: TSH 1.79 02/24/2021: Hemoglobin 13.7; Platelets 176 03/30/2021: BUN 21;  Creatinine, Ser 0.97; Potassium 4.4; Sodium 140  Recent Lipid Panel    Component Value Date/Time   CHOL 136 03/11/2021 1054   TRIG 94.0 03/11/2021 1054   HDL 66.20 03/11/2021 1054   CHOLHDL 2 03/11/2021 1054   VLDL 18.8 03/11/2021 1054   LDLCALC 51 03/11/2021 1054       Physical Exam:    VS:  BP 129/64    Pulse 72    Ht 5\' 6"  (1.676 m)    Wt 189 lb (85.7 kg)    SpO2 96%    BMI 30.51 kg/m     Wt Readings from Last 3 Encounters:  08/27/21 189 lb (85.7 kg)  05/27/21 184 lb 8 oz (83.7 kg)  05/05/21 186 lb 9.6 oz (84.6 kg)     Constitutional:  oriented to person, place, and time. No distress.  HENT:  Head: Grossly normal Eyes:  no discharge. No scleral icterus.  Neck: No JVD, no carotid bruits  Cardiovascular: Regular rate and rhythm, no murmurs appreciated Pulmonary/Chest: Clear to auscultation bilaterally,  Scattered Rales Abdominal: Soft.  no distension.  no tenderness.  Musculoskeletal: Normal range of motion Neurological:  normal muscle tone. Coordination normal. No atrophy Skin: Skin warm and dry Psychiatric: normal affect, pleasant  ASSESSMENT:    1. Coronary artery disease of native artery of native heart with stable angina pectoris ([place])   2. PAD (peripheral artery disease) ([place])   3. Essential hypertension   4. Aortic atherosclerosis (HCC)    PLAN:    Chronic shortness of breath Deconditioning, obesity, stress stress test with no significant ischemia Echocardiogram normal ejection fraction EKG normal, Recommended walking program for conditioning, reassurance provided  CAD with chronic stable angina No further ischemic work-up at this time, cholesterol at goal Lifestyle modification recommended  Obesity We have encouraged continued exercise, careful diet management in an effort to lose weight.  Scarring of lung/shortness of breath Had PNA in the past Followed by pulmonary, Stressed importance of weight loss, conditioning No additional cardiac  testing needed  Leg swelling Amlodipine previously held Leg swelling resolved with vascular procedure, lymphedema compression pumps  Essential hypertension olmesartan from 5 BID BP stable No ca channel blocker   Total encounter time more than 30 minutes  Greater than 50% was spent in counseling and coordination of care with the patient    Signed, Leah Rogue, MD  08/27/2021 8:39 AM    [place]

## 2021-08-27 NOTE — Patient Instructions (Signed)
Medication Instructions:  No changes  If you need a refill on your cardiac medications before your next appointment, please call your pharmacy.   Lab work: No new labs needed  Testing/Procedures: No new testing needed  Follow-Up: At [facility], you and your health needs are our priority.  As part of our continuing mission to provide you with exceptional heart care, we have created designated Provider Care Teams.  These Care Teams include your primary Cardiologist (physician) and Advanced Practice Providers (APPs -  Physician Assistants and Nurse Practitioners) who all work together to provide you with the care you need, when you need it.  You will need a follow up appointment in 12 months  Providers on your designated Care Team:   Christopher Berge, NP Ryan Dunn, PA-C Cadence Furth, PA-C  COVID-19 Vaccine Information can be found at: [email or web address] For questions related to vaccine distribution or appointments, please email [email or web address] or call [phone].   

## 2021-09-06 DIAGNOSIS — Z6832 Body mass index (BMI) 32.0-32.9, adult: Secondary | ICD-10-CM | POA: Diagnosis not present

## 2021-09-06 DIAGNOSIS — G43909 Migraine, unspecified, not intractable, without status migrainosus: Secondary | ICD-10-CM | POA: Insufficient documentation

## 2021-09-06 DIAGNOSIS — Z124 Encounter for screening for malignant neoplasm of cervix: Secondary | ICD-10-CM | POA: Diagnosis not present

## 2021-09-06 DIAGNOSIS — J302 Other seasonal allergic rhinitis: Secondary | ICD-10-CM | POA: Insufficient documentation

## 2021-09-06 LAB — HM PAP SMEAR: HM Pap smear: NEGATIVE

## 2021-09-07 ENCOUNTER — Telehealth: Payer: Self-pay | Admitting: Internal Medicine

## 2021-09-07 ENCOUNTER — Encounter: Payer: Self-pay | Admitting: Internal Medicine

## 2021-09-07 ENCOUNTER — Other Ambulatory Visit: Payer: Self-pay | Admitting: *Deleted

## 2021-09-07 ENCOUNTER — Ambulatory Visit (INDEPENDENT_AMBULATORY_CARE_PROVIDER_SITE_OTHER): Payer: PPO | Admitting: Internal Medicine

## 2021-09-07 ENCOUNTER — Other Ambulatory Visit: Payer: Self-pay

## 2021-09-07 VITALS — BP 122/76 | HR 71 | Temp 98.3°F | Ht 66.0 in | Wt 189.4 lb

## 2021-09-07 DIAGNOSIS — H9201 Otalgia, right ear: Secondary | ICD-10-CM

## 2021-09-07 DIAGNOSIS — I2510 Atherosclerotic heart disease of native coronary artery without angina pectoris: Secondary | ICD-10-CM

## 2021-09-07 DIAGNOSIS — R0602 Shortness of breath: Secondary | ICD-10-CM

## 2021-09-07 DIAGNOSIS — I208 Other forms of angina pectoris: Secondary | ICD-10-CM

## 2021-09-07 DIAGNOSIS — J984 Other disorders of lung: Secondary | ICD-10-CM | POA: Diagnosis not present

## 2021-09-07 DIAGNOSIS — U099 Post covid-19 condition, unspecified: Secondary | ICD-10-CM

## 2021-09-07 MED ORDER — NEOMYCIN-POLYMYXIN-HC 3.5-10000-1 OT SOLN: 4.0000 [drp] | Freq: Three times a day (TID) | OTIC | 0 refills | Status: DC

## 2021-09-07 NOTE — Telephone Encounter (Signed)
Pt wants to go back to cardiac/pulmonary rehab having sob walking in the home and walking from office to parking lot in [place]  H/o lung scarring on prior CT scan and CAD and valve issues heart mild to moderate    Can we coordinate this for her and call pt please

## 2021-09-07 NOTE — Telephone Encounter (Signed)
Would pt benefit from CT calcium score ?

## 2021-09-07 NOTE — Progress Notes (Addendum)
Chief Complaint  Patient presents with   Follow-up   Shortness of Breath   F/u  1. Worsening sob with exertion and chest discomfort Dr. Rockey Situ reports no cardiac etiology CT chest 02/17/21 normal no etiology for lungs to cause sob, stress test 02/21/20 negative, echo 01/07/20 normal except for mild to moderate valve issues  Pt has h/o lung scarring apices and bases H/o covid x 2 (late 03/2021, 04/2021 and bronchitis, recurrent  She never did cardiacpulm rehab and sob with exertion at baseline but worsening since covid x 2  2. C/o right ear pain resolved for now  Review of Systems  Constitutional:  Negative for weight loss.  HENT:  Negative for hearing loss.   Eyes:  Negative for blurred vision.  Respiratory:  Positive for shortness of breath.   Cardiovascular:  Negative for chest pain.  Gastrointestinal:  Negative for abdominal pain and blood in stool.  Genitourinary:  Negative for dysuria.  Musculoskeletal:  Negative for falls and joint pain.  Skin:  Negative for rash.  Neurological:  Negative for headaches.  Psychiatric/Behavioral:  Negative for depression.   Past Medical History:  Diagnosis Date   Acid reflux    Allergy    Anemia    Anxiety    in past   Arthritis    all over   Asthma    Atypical moles    Bronchitis    Bronchospasm    reactive with perfume/cologne and smoke   CAD (coronary artery disease)    patient denies on preop of 10/24    Candida esophagitis (HCC)    Carotid artery calcification, bilateral 04/2016   Cataract    Cervical stenosis of spine    Chronic insomnia    Complication of anesthesia    shoulder surgery- not all the way asleep   COVID-19 02/04/2021   also 10/3 or 04/20/21 sxs started 04/05/21   Depression    in past   Diverticulitis    Diverticulosis    Dizziness    Elevated liver enzymes    Family history of adverse reaction to anesthesia    sister- has problems with nausea and vomiting    Fatigue    Fatty liver    Generalized headache     migraines   GERD (gastroesophageal reflux disease)    History of chemotherapy    topical cream for h/o skin CA   History of kidney stones    History of neuropathy    FH Dr. Posey Pronto neurology   History of vertigo    Hx of migraines    Hyperlipidemia    Hypothyroidism    Insomnia    Irritable bowel syndrome    LLQ abdominal pain    Melanoma ([place]) 2013   Melanoma ([place])    x2 stg II melanoma   Neuropathy, peripheral    Peripheral neuropathy    Pneumonia    hx of x 2   PONV (postoperative nausea and vomiting)    Pre-diabetes    Retina disorder    left   SCC (squamous cell carcinoma)    skin    Stroke ([place])    ? TIA per Dr V per patient    Thyroid disease    Urine incontinence    Vitamin D deficiency    Weight gain    Past Surgical History:  Procedure Laterality Date   BILATERAL SALPINGOOPHORECTOMY     ovarian cyst Dr. Ouida Sills and Dr. Alisia Ferrari 2008   BREAST SURGERY     in  2014 bx breast calcifications   BREAST SURGERY     augmentation with saline Dr. Hubbard Hartshorn 1995    CHOLECYSTECTOMY N/A 01/28/2015   Procedure: LAPAROSCOPIC CHOLECYSTECTOMY WITH INTRAOPERATIVE CHOLANGIOGRAM;  Surgeon: Autumn Messing III, MD;  Location: [facility];  Service: General;  Laterality: N/A;   CLOSED MANIPULATION SHOULDER     rt   COLONOSCOPY WITH PROPOFOL N/A 11/20/2012   Procedure: COLONOSCOPY WITH PROPOFOL;  Surgeon: Garlan Fair, MD;  Location: [facility];  Service: Endoscopy;  Laterality: N/A;   colonscopy     May 2014   JOINT REPLACEMENT     RTK   KNEE ARTHROSCOPY     left knee miniscus tear repair     MELANOMA EXCISION WITH SENTINEL LYMPH NODE BIOPSY  03/18/11   Back, nodes neg   OOPHORECTOMY     ROTATOR CUFF REPAIR     left   SHOULDER SURGERY     right and left    SKIN CANCER EXCISION     multiple   TOTAL HIP ARTHROPLASTY     TOTAL KNEE ARTHROPLASTY     right 2010 and left in 2018    [place] Left 05/16/2017   Procedure: LEFT TOTAL KNEE ARTHROPLASTY;  Surgeon:  Paralee Cancel, MD;  Location: [facility];  Service: Orthopedics;  Laterality: Left;  70 mins   TUBAL LIGATION     TUMOR REMOVAL     ovary   Family History  Problem Relation Age of Onset   Hypertension Mother    Stroke Mother    Hyperlipidemia Mother    Heart disease Father    Diabetes Father    Hypertension Father    Hyperlipidemia Father    Early death Father    Diabetes Sister    Heart disease Sister        heart murmur   Arthritis Sister    COPD Sister    Hyperlipidemia Sister    Hypertension Sister    Colon polyps Sister        adenomous   Arthritis Sister    Heart disease Sister    Hyperlipidemia Sister    Hypertension Sister    Cancer Sister    Stroke Maternal Grandmother    Colon cancer Maternal Grandfather    Cancer Maternal Grandfather    Early death Maternal Grandfather    Early death Paternal Grandmother    Stroke Paternal Grandmother    Heart disease Daughter    Hyperlipidemia Daughter    Hypertension Daughter    Heart disease Son    Hyperlipidemia Son    Alcohol abuse Son    Depression Son    Diabetes Son    Pancreatic cancer Maternal Uncle    Alzheimer's disease Paternal Aunt    Alzheimer's disease Paternal Aunt    Alzheimer's disease Paternal Aunt    Alzheimer's disease Paternal Aunt    Cancer Other        2 paternal uncles and 1 maternal uncle colon cancer   Esophageal cancer Neg Hx    Stomach cancer Neg Hx    Social History   Socioeconomic History   Marital status: Divorced    Spouse name: Not on file   Number of children: 3   Years of education: College   Highest education level: Some college, no degree  Occupational History   Occupation: Retired    Fish farm manager: RETIRED  Tobacco Use   Smoking status: Never   Smokeless tobacco: Never  Vaping Use   Vaping Use:  Never used  Substance and Sexual Activity   Alcohol use: Yes    Alcohol/week: 1.0 standard drink    Types: 1 Cans of beer per week    Comment: once a week beer or wine   Drug  use: No   Sexual activity: Not Currently  Other Topics Concern   Not on file  Social History Narrative   Patient lives at home alone in a one story home.   Has 3 children (2 daughters and 1 son)    Retired from [employer]   Divorced since 1992    Caffeine Use: 1 cup daily and a soft drink occasionally    No guns    Wears seat belt     Lives with cat    Left handed   Social Determinants of Health   Financial Resource Strain: Not on file  Food Insecurity: Not on file  Transportation Needs: Not on file  Physical Activity: Not on file  Stress: Not on file  Social Connections: Not on file  Intimate Partner Violence: Not on file   Current Meds  Medication Sig   albuterol (PROAIR HFA) 108 (90 Base) MCG/ACT inhaler Inhale 1-2 puffs into the lungs every 6 (six) hours as needed for wheezing or shortness of breath.   aspirin 81 MG tablet TAKE 1 TABLET BY MOUTH EVERY DAY   atorvastatin (LIPITOR) 20 MG tablet Take 1 tablet (20 mg total) by mouth daily.   Calcium 600-200 MG-UNIT tablet Take 1 tablet daily by mouth.   Cholecalciferol (VITAMIN D3) 2000 UNITS capsule Take 2,000 Units by mouth daily.   FOLIC ACID PO Take by mouth daily.   levothyroxine (SYNTHROID) 75 MCG tablet Take 1 tablet (75 mcg total) by mouth daily before breakfast.   loperamide (IMODIUM A-D) 2 MG tablet Take 1 tablet (2 mg total) by mouth as needed for diarrhea or loose stools.   neomycin-polymyxin-hydrocortisone (CORTISPORIN) OTIC solution Place 4 drops into the right ear 3 (three) times daily. Right ear pain   olmesartan (BENICAR) 5 MG tablet Take 2 tablets (10 mg total) by mouth in the morning and at bedtime.   Omega-3 Fatty Acids (FISH OIL) 1000 MG CAPS Take 1,000 mg by mouth daily.    omeprazole (PRILOSEC) 20 MG capsule Take 1 capsule (20 mg total) by mouth 2 (two) times daily before a meal.   sertraline (ZOLOFT) 25 MG tablet Take 1 tablet (25 mg total) by mouth daily.   Allergies  Allergen  Reactions   Epinephrine Other (See Comments) and Anaphylaxis    Heart palpitations   Penicillins Swelling and Other (See Comments)    Lip swelling Has patient had a PCN reaction causing immediate rash, facial/tongue/throat swelling, SOB or lightheadedness with hypotension: Yes Has patient had a PCN reaction causing severe rash involving mucus membranes or skin necrosis: No Has patient had a PCN reaction that required hospitalization No Has patient had a PCN reaction occurring within the last 10 years: No If all of the above answers are "NO", then may proceed with Cephalosporin use.   Lidocaine Hcl Other (See Comments)   Other     Black pepper- caused bronchial tubes to start closing    Valium [Diazepam] Other (See Comments)    Other reaction(s): Other (See Comments) Overly sensitive  sedation   No results found for this or any previous visit (from the past 2160 hour(s)). Objective  Body mass index is 30.57 kg/m. Wt Readings from Last 3 Encounters:  09/07/21 189 lb  6.4 oz (85.9 kg)  08/27/21 189 lb (85.7 kg)  05/27/21 184 lb 8 oz (83.7 kg)   Temp Readings from Last 3 Encounters:  09/07/21 98.3 F (36.8 C) (Oral)  05/05/21 (!) 97 F (36.1 C)  03/04/21 97.6 F (36.4 C) (Oral)   BP Readings from Last 3 Encounters:  09/07/21 122/76  08/27/21 129/64  05/05/21 130/86   Pulse Readings from Last 3 Encounters:  09/07/21 71  08/27/21 72  05/27/21 62    Physical Exam Vitals and nursing note reviewed.  Constitutional:      Appearance: Normal appearance. She is well-developed and well-groomed.  HENT:     Head: Normocephalic and atraumatic.  Eyes:     Conjunctiva/sclera: Conjunctivae normal.     Pupils: Pupils are equal, round, and reactive to light.  Cardiovascular:     Rate and Rhythm: Normal rate and regular rhythm.     Heart sounds: Normal heart sounds. No murmur heard. Pulmonary:     Effort: Pulmonary effort is normal.     Breath sounds: Normal breath sounds.   Abdominal:     General: Abdomen is flat. Bowel sounds are normal.     Tenderness: There is no abdominal tenderness.  Musculoskeletal:        General: No tenderness.  Skin:    General: Skin is warm and dry.  Neurological:     General: No focal deficit present.     Mental Status: She is alert and oriented to person, place, and time. Mental status is at baseline.     Cranial Nerves: Cranial nerves 2-12 are intact.     Motor: Motor function is intact.     Coordination: Coordination is intact.     Gait: Gait is intact.  Psychiatric:        Attention and Perception: Attention and perception normal.        Mood and Affect: Mood and affect normal.        Speech: Speech normal.        Behavior: Behavior normal. Behavior is cooperative.        Thought Content: Thought content normal.        Cognition and Memory: Cognition and memory normal.        Judgment: Judgment normal.    Assessment  Plan  SOB (shortness of breath) on exertion - Plan: Ambulatory referral to Pulmonology Pfts had 01/07/20 normal  Consider start cardiopulm rehab  Consider CT cardiac with h/o LAD CAD-per Dr. Rockey Situ not needed known CAD several Ct scans in the past   Apical lung scarring - Plan: Ambulatory referral to Pulmonology  Right ear pain - Plan: neomycin-polymyxin-hydrocortisone (CORTISPORIN) OTIC solution   HM Flu shot consider 2022  Tdap -had TD 10/18/12 not pertussis will need this in future  shingrix will need if has not had  zostervax had 06/16/11  prevnar had 05/15/14  covid 4/4 vaccines utd pfizer covid + 02/04/21 and 04/05/21-04/20/21  Pna 23 had 12/16/05, 05/18/16    Pap out of age window LMP 1990s both ovaries and Fts removed 2008 -referred 05/05/21 [facility] female pelvic pain h/o genital warts Seen 08/2021 Dr.Megan [place] with implants 01/21/20 normal  -10/18/16 screening with implants neg FH breast cancer  p cousins dx'ed in 72s x 79, sister  Had at age 50 y.o and colon cancer age 28, 2 paternal  cousins ovarian cancer age 69 and 57  01/21/20 negative ordered [facility] for 01/20/21 and scheduled and neg    Colonoscopy  11/2012 tortuous colon no further rec.  -she is established and f/u GI Dr. Farrel Gordon [place] virtual coloscopy had 02/25/20 -leb  02/25/20 CT colonoscopy-leb GI   IMPRESSION: 1. Suboptimal evaluation of the sigmoid, secondary to underdistention. No circumferential mass within this region. More proximally, no evidence of clinically significant colonic polyp or mass. 2. Small hiatal hernia. 3. Aortic Atherosclerosis (ICD10-I70.0).     DEXA [facility] PCP 06/28/16 normal reviewed report did not scan into chart -h/o vitamin D def on D3 2000 IU daily     [facility]at[facility]" [facility] Dr. Delman Cheadle mohs surgery  F/u from 04/10/19 left lower leg cellulitis for SCC changed to levaquin rec compression socks and acidic acid soaks qhs   05/15/19 f/u appt s/p SCC mohs removal f/u in 3 months Dr. Winifred Olive [facility]  F/u with Dr. Delman Cheadle derm and [facility] mohs as scheduled freq f/us   Echo 01/07/20  FINDINGS   Left Ventricle: Left ventricular ejection fraction, by estimation, is 60  to 65%. The left ventricle has normal function. The left ventricle has no  regional wall motion abnormalities. The left ventricular internal cavity  size was normal in size. There is   borderline left ventricular hypertrophy. Left ventricular diastolic  parameters are consistent with age-related delayed relaxation (normal).   Right Ventricle: The right ventricular size is normal. No increase in  right ventricular wall thickness. Right ventricular systolic function is  normal. There is normal pulmonary artery systolic pressure. The tricuspid  regurgitant velocity is 2.46 m/s, and   with an assumed right atrial pressure of 3 mmHg, the estimated right  ventricular systolic pressure is 41.2 mmHg.   Left Atrium: Left atrial size was normal in size.   Right Atrium: Right atrial size was normal in  size.   Pericardium: There is no evidence of pericardial effusion.   Mitral Valve: The mitral valve is degenerative in appearance. There is  mild thickening of the mitral valve leaflet(s). Mild mitral valve  regurgitation. No evidence of mitral valve stenosis. MV peak gradient, 3.7  mmHg. The mean mitral valve gradient is  1.0 mmHg.   Tricuspid Valve: The tricuspid valve is normal in structure. Tricuspid  valve regurgitation is mild to moderate.   Aortic Valve: The aortic valve is tricuspid. Aortic valve regurgitation is  not visualized. No aortic stenosis is present. Aortic valve mean gradient  measures 4.0 mmHg. Aortic valve peak gradient measures 9.1 mmHg. Aortic  valve area, by VTI measures 2.19  cm.   Pulmonic Valve: The pulmonic valve was not well visualized. Pulmonic valve  regurgitation is mild.   Aorta: The aortic root is normal in size and structure.   Pulmonary Artery: The pulmonary artery is not well seen.   Venous: The inferior vena cava is normal in size with greater than 50%  respiratory variability, suggesting right atrial pressure of 3 mmHg.   IAS/Shunts: The interatrial septum was not well visualized.   Specialists  [facility] saw 01/01/19 increased nocturia and urgency pending Botox to tx and UDS Dr. Junious Silk and try myrbetriq before UDS    Cards-Dr. Rockey Situ leb GI Leb GI Dr. Moss Mc MD Dr. Cordelia Pen [facility]-retinal specialist Dentist Dr. Lynelle Smoke Neurology-Leb Neurology Dr. Posey Pronto  ENT-Dr. Tami Ribas for vestibular balance issue s Podiatry Dr. Milinda Pointer H/o Dr. Earlie Server [facility] Lung-Dr. Patsey Berthold , Leb in [place] Ortho Dr. Alvan Dame Surgery Dr.Toth Ob/gyn-physicians [facility]   Dermatology Dr. Delman Cheadle in [place] h/o SCC left leg and MM right leg, neck and  back with node dissection Dr. Marlou Starks 8/12    Former PCP Dr. Jossie Ng -pt brought in all records week of 09/21/2018 and reviewed -h/o iron def anemia -last seen 07/09/18 and 08/09/18 possible thrush and URI   US carotid  05/13/16 <50% stenosis in right and left ICA -she was at this time put on lipitor 10 mg qhs   CT ab/pelvis 07/26/17 left base scarring lung, well circumcised liver lesion likely cysts and stable hepatomegaly 19.4 cm cranlocaudal pancreas/spleen normal, extensive colonic diverticulosis, aortic and branch vessell atherosclerosis no adenopathy, normal uterus and adnexa, advanced LS spondylosis grade 1 L4/5 and L5/S1 anterolisthesis are similar, pelvic floor laxity, hiatal hernia CXR 02/26/18 thoracic aorta mildly atherosclerotic unchanged, Deg changes T spine, Deg changes b/l shoulders prior right shoulder surgery calcification distal left supraspinatus tendon at its insertion on the great tuberosity of the left humerus   Provider: Dr. Olivia Mackie Mc[facility]

## 2021-09-07 NOTE — Patient Instructions (Signed)
Dr. Lamonte Sakai  Phone Fax E-mail Address  [phone] [email or web address] [address]   [address]   [place] [address]     Specialties     Pulmonary Disease      Shortness of Breath, Adult Shortness of breath is when a person has trouble breathing or when a person feels like she or he is having trouble breathing in enough air. Shortness of breath could be a sign of a medical problem. Follow these instructions at home: Pollutants Do not use any products that contain nicotine or tobacco. These products include cigarettes, chewing tobacco, and vaping devices, such as e-cigarettes. This also includes cigars and pipes. If you need help quitting, ask your health care provider. Avoid things that can irritate your airways, including: Smoke. This includes campfire smoke, forest fire smoke, and secondhand smoke from tobacco products. Do not smoke or allow others to smoke in your home. Mold. Dust. Air pollution. Chemical fumes. Things that can give you an allergic reaction (allergens) if you have allergies. Common allergens include pollen from grasses or trees and animal dander. Keep your living space clean and free of mold and dust. General instructions Pay attention to any changes in your symptoms. Take over-the-counter and prescription medicines only as told by your health care provider. This includes oxygen therapy and inhaled medicines. Rest as needed. Return to your normal activities as told by your health care provider. Ask your health care provider what activities are safe for you. Keep all follow-up visits. This is important. Contact a health care provider if: Your condition does not improve as soon as expected. You have a hard time doing your normal activities, even after you rest. You have new symptoms. You cannot walk up stairs or exercise the way that you normally do. Get help right away if: Your shortness of breath gets worse. You have shortness of  breath when you are resting. You feel light-headed or you faint. You have a cough that is not controlled with medicines. You cough up blood. You have pain with breathing. You have pain in your chest, arms, shoulders, or abdomen. You have a fever. These symptoms may be an emergency. Get help right away. Call 911. Do not wait to see if the symptoms will go away. Do not drive yourself to the hospital. Summary Shortness of breath is when a person has trouble breathing enough air. It can be a sign of a medical problem. Avoid things that irritate your lungs, such as smoking, pollution, mold, and dust. Pay attention to changes in your symptoms and contact your health care provider if you have a hard time completing daily activities because of shortness of breath. This information is not intended to replace advice given to you by your health care provider. Make sure you discuss any questions you have with your health care provider. Document Revised: 02/20/2021 Document Reviewed: 02/20/2021 Elsevier Patient Education  [place].

## 2021-09-08 ENCOUNTER — Telehealth: Payer: Self-pay | Admitting: Pulmonary Disease

## 2021-09-08 NOTE — Telephone Encounter (Signed)
Spoke to patient and relayed below message.  Patient stated that Dr. Aundra Dubin recommended that she see Dr. Lamonte Sakai.  She would like to keep things as is and discuss this further with Dr. Aundra Dubin. She will call back with update.  Nothing further needed.

## 2021-09-08 NOTE — Telephone Encounter (Signed)
Melissa, can you help with this?  Patient seen Dr. Patsey Berthold 02/18/2021.

## 2021-09-08 NOTE — Telephone Encounter (Signed)
Agree with Dr. Lamonte Sakai.

## 2021-09-08 NOTE — Telephone Encounter (Signed)
Lm for patient.  

## 2021-09-08 NOTE — Telephone Encounter (Signed)
She has requested this previously. Has seen Dr. Lamonte Sakai as well.

## 2021-09-08 NOTE — Telephone Encounter (Signed)
I reviewed the chart - my notes and Dr Domingo Dimes notes. She and I seem to agree in our assessment. I don't think the patient needs to continue to go back and forth between myself and Dr Darnell Level. If she wants a 2nd opinion, then I would suggest that she be referred outside the [facility] system, although I do believe that our evaluation (including the cardiac eval) has been thorough. I do not believe she needs to come back to see me.

## 2021-09-15 NOTE — Telephone Encounter (Signed)
Attempted to reach out to [name], unable to make contact ?LDM on cell VM (DPR approved) ? ?Advised on PCP request to [name] for cardiac rehab, [name] advised ? ?Can we call [name] to make sure if order is placed she will go faithfully to rehab?  ?If she agrees, we can place new order for cardiac rehab  ?Chronic stable angina, obesity, shortness of breath, known coronary disease  ?Thx  ?TG  ? ?Advised pt order has been placed, cardiac rehab should reach out to her soon with first consult in the next few weeks, otherwise she can call our office for any questions/concerns.  ?

## 2021-09-15 NOTE — Addendum Note (Signed)
Addended by: Wynema Birch on: 09/15/2021 10:26 AM ? ? Modules accepted: Orders ? ?

## 2021-09-24 ENCOUNTER — Encounter: Payer: Self-pay | Admitting: Internal Medicine

## 2021-10-05 ENCOUNTER — Telehealth: Payer: Self-pay | Admitting: Internal Medicine

## 2021-10-05 NOTE — Telephone Encounter (Signed)
Pt want blood orders put in because she has had amenia before and want to make sure her blood is not low. Pt has been tired and sleeping all the time ? ?

## 2021-10-06 ENCOUNTER — Ambulatory Visit: Payer: PPO | Admitting: Podiatry

## 2021-10-06 ENCOUNTER — Other Ambulatory Visit: Payer: Self-pay | Admitting: Internal Medicine

## 2021-10-06 DIAGNOSIS — R5383 Other fatigue: Secondary | ICD-10-CM

## 2021-10-06 DIAGNOSIS — E611 Iron deficiency: Secondary | ICD-10-CM

## 2021-10-06 DIAGNOSIS — E538 Deficiency of other specified B group vitamins: Secondary | ICD-10-CM

## 2021-10-06 DIAGNOSIS — E559 Vitamin D deficiency, unspecified: Secondary | ICD-10-CM

## 2021-10-06 NOTE — Telephone Encounter (Signed)
Please advise on what labs need to be ordered. Can then schedule Patient for labs with appointment to discuss results.  ?

## 2021-10-06 NOTE — Telephone Encounter (Signed)
Orders in please sch lab visit and f/u appt after labs thank you ? ?

## 2021-10-07 NOTE — Telephone Encounter (Signed)
Noted  

## 2021-10-07 NOTE — Telephone Encounter (Signed)
Patient called and scheduled for labs and a follow up with provider. ?

## 2021-10-08 ENCOUNTER — Other Ambulatory Visit: Payer: Self-pay | Admitting: Family Medicine

## 2021-10-08 DIAGNOSIS — I10 Essential (primary) hypertension: Secondary | ICD-10-CM

## 2021-10-11 ENCOUNTER — Other Ambulatory Visit (INDEPENDENT_AMBULATORY_CARE_PROVIDER_SITE_OTHER): Payer: PPO

## 2021-10-11 ENCOUNTER — Other Ambulatory Visit: Payer: Self-pay

## 2021-10-11 DIAGNOSIS — R5383 Other fatigue: Secondary | ICD-10-CM

## 2021-10-11 DIAGNOSIS — E559 Vitamin D deficiency, unspecified: Secondary | ICD-10-CM | POA: Diagnosis not present

## 2021-10-11 DIAGNOSIS — E611 Iron deficiency: Secondary | ICD-10-CM

## 2021-10-11 DIAGNOSIS — E538 Deficiency of other specified B group vitamins: Secondary | ICD-10-CM

## 2021-10-11 LAB — CBC WITH DIFFERENTIAL/PLATELET
Basophils Absolute: 0 10*3/uL (ref 0.0–0.1)
Basophils Relative: 0.7 % (ref 0.0–3.0)
Eosinophils Absolute: 0.2 10*3/uL (ref 0.0–0.7)
Eosinophils Relative: 3.2 % (ref 0.0–5.0)
HCT: 41.7 % (ref 36.0–46.0)
Hemoglobin: 13.6 g/dL (ref 12.0–15.0)
Lymphocytes Relative: 33.8 % (ref 12.0–46.0)
Lymphs Abs: 1.8 10*3/uL (ref 0.7–4.0)
MCHC: 32.6 g/dL (ref 30.0–36.0)
MCV: 90.8 fl (ref 78.0–100.0)
Monocytes Absolute: 0.5 10*3/uL (ref 0.1–1.0)
Monocytes Relative: 8.8 % (ref 3.0–12.0)
Neutro Abs: 2.9 10*3/uL (ref 1.4–7.7)
Neutrophils Relative %: 53.5 % (ref 43.0–77.0)
Platelets: 192 10*3/uL (ref 150.0–400.0)
RBC: 4.59 Mil/uL (ref 3.87–5.11)
RDW: 14.1 % (ref 11.5–15.5)
WBC: 5.4 10*3/uL (ref 4.0–10.5)

## 2021-10-11 LAB — COMPREHENSIVE METABOLIC PANEL
ALT: 12 U/L (ref 0–35)
AST: 13 U/L (ref 0–37)
Albumin: 4.4 g/dL (ref 3.5–5.2)
Alkaline Phosphatase: 63 U/L (ref 39–117)
BUN: 13 mg/dL (ref 6–23)
CO2: 30 mEq/L (ref 19–32)
Calcium: 9.3 mg/dL (ref 8.4–10.5)
Chloride: 106 mEq/L (ref 96–112)
Creatinine, Ser: 0.92 mg/dL (ref 0.40–1.20)
GFR: 58.67 mL/min — ABNORMAL LOW (ref >60.00)
Glucose, Bld: 122 mg/dL — ABNORMAL HIGH (ref 70–99)
Potassium: 4.5 mEq/L (ref 3.5–5.1)
Sodium: 142 mEq/L (ref 135–145)
Total Bilirubin: 1.5 mg/dL — ABNORMAL HIGH (ref 0.2–1.2)
Total Protein: 6.5 g/dL (ref 6.0–8.3)

## 2021-10-12 ENCOUNTER — Encounter: Payer: Self-pay | Admitting: Internal Medicine

## 2021-10-12 DIAGNOSIS — E559 Vitamin D deficiency, unspecified: Secondary | ICD-10-CM | POA: Insufficient documentation

## 2021-10-12 LAB — VITAMIN B12: Vitamin B-12: 1368 pg/mL — ABNORMAL HIGH (ref 211–911)

## 2021-10-12 LAB — VITAMIN D 25 HYDROXY (VIT D DEFICIENCY, FRACTURES): VITD: 27.91 ng/mL — ABNORMAL LOW (ref 30.00–100.00)

## 2021-10-12 LAB — IBC + FERRITIN
Ferritin: 24.1 ng/mL (ref 10.0–291.0)
Iron: 89 ug/dL (ref 42–145)
Saturation Ratios: 22.5 % (ref 20.0–50.0)
TIBC: 394.8 ug/dL (ref 250.0–450.0)
Transferrin: 282 mg/dL (ref 212.0–360.0)

## 2021-10-13 ENCOUNTER — Encounter: Payer: Self-pay | Admitting: Internal Medicine

## 2021-10-13 ENCOUNTER — Ambulatory Visit (INDEPENDENT_AMBULATORY_CARE_PROVIDER_SITE_OTHER): Payer: PPO | Admitting: Internal Medicine

## 2021-10-13 ENCOUNTER — Other Ambulatory Visit: Payer: Self-pay

## 2021-10-13 VITALS — Ht 66.0 in | Wt 186.0 lb

## 2021-10-13 DIAGNOSIS — R0789 Other chest pain: Secondary | ICD-10-CM | POA: Diagnosis not present

## 2021-10-13 DIAGNOSIS — E559 Vitamin D deficiency, unspecified: Secondary | ICD-10-CM | POA: Diagnosis not present

## 2021-10-13 DIAGNOSIS — K449 Diaphragmatic hernia without obstruction or gangrene: Secondary | ICD-10-CM | POA: Diagnosis not present

## 2021-10-13 DIAGNOSIS — R5383 Other fatigue: Secondary | ICD-10-CM

## 2021-10-13 NOTE — Progress Notes (Signed)
Telephone note  ? ?I connected with [name] ? on 10/13/21 at 10:00 AM EDT by telephone and verified that I am speaking with the correct person using two identifiers. ? Location patient:  ?Location provider:work or home office ?Persons participating in the virtual visit: patient, provider ? ?I discussed the limitations and requested verbal permission for telemedicine visit. The patient expressed understanding and agreed to proceed. ? ? ?HPI: ? ?Acute telemedicine visit for : ?F/u labs 10/11/21 vitamin D def 27.91  ?Rec D3 2000 to 4000 IU daily  ? ?Atypical chest pain thinks hiatal hernia was acting up over the weekend tried otc meds for heartburn and resolved h/o CAD ?Pain was right sided on top of right breast resolved  ? ?-Pertinent past medical history: see below ?-Pertinent medication allergies: ?Allergies  ?Allergen Reactions  ? Epinephrine Other (See Comments) and Anaphylaxis  ?  Heart palpitations  ? Penicillins Swelling and Other (See Comments)  ?  Lip swelling ?Has patient had a PCN reaction causing immediate rash, facial/tongue/throat swelling, SOB or lightheadedness with hypotension: Yes ?Has patient had a PCN reaction causing severe rash involving mucus membranes or skin necrosis: No ?Has patient had a PCN reaction that required hospitalization No ?Has patient had a PCN reaction occurring within the last 10 years: No ?If all of the above answers are "NO", then may proceed with Cephalosporin use.  ? Lidocaine Hcl Other (See Comments)  ? Other   ?  Black pepper- caused bronchial tubes to start closing   ? Valium [Diazepam] Other (See Comments)  ?  Other reaction(s): Other (See Comments) ?Overly sensitive  ?sedation  ? ?-COVID-19 vaccine status:  ?Immunization History  ?Administered Date(s) Administered  ? Fluad Quad(high Dose 65+) 04/25/2019, 04/14/2020, 06/24/2021  ? Influenza Split 04/18/2017  ? Influenza, High Dose Seasonal PF 04/18/2017, 04/12/2018  ? Influenza-Unspecified 05/03/2018  ? PFIZER  Comirnaty(Gray Top)Covid-19 Tri-Sucrose Vaccine 12/03/2020  ? PFIZER(Purple Top)SARS-COV-2 Vaccination 08/12/2019, 09/02/2019, 03/16/2020  ? Pension scheme manager 46yr & up 08/09/2021  ? Pneumococcal Conjugate-13 05/15/2014  ? Pneumococcal Polysaccharide-23 12/16/2005, 05/18/2016  ? Td 10/18/2012  ? Zoster Recombinat (Shingrix) 06/16/2011  ? ? ? ?ROS: See pertinent positives and negatives per HPI. ? ?Past Medical History:  ?Diagnosis Date  ? Acid reflux   ? Allergy   ? Anemia   ? Anxiety   ? in past  ? Arthritis   ? all over  ? Asthma   ? Atypical moles   ? Bronchitis   ? Bronchospasm   ? reactive with perfume/cologne and smoke  ? CAD (coronary artery disease)   ? patient denies on preop of 10/24   ? Candida esophagitis ([place]   ? Carotid artery calcification, bilateral 04/2016  ? Cataract   ? Cervical stenosis of spine   ? Chronic insomnia   ? Complication of anesthesia   ? shoulder surgery- not all the way asleep  ? COVID-19 02/04/2021  ? also 10/3 or 04/20/21 sxs started 04/05/21  ? Depression   ? in past  ? Diverticulitis   ? Diverticulosis   ? Dizziness   ? Elevated liver enzymes   ? Family history of adverse reaction to anesthesia   ? sister- has problems with nausea and vomiting   ? Fatigue   ? Fatty liver   ? Generalized headache   ? migraines  ? GERD (gastroesophageal reflux disease)   ? History of chemotherapy   ? topical cream for h/o skin CA  ? History of kidney  stones   ? History of neuropathy   ? [facility] Dr. Posey Pronto neurology  ? History of vertigo   ? Hx of migraines   ? Hyperlipidemia   ? Hypothyroidism   ? Insomnia   ? Irritable bowel syndrome   ? LLQ abdominal pain   ? Melanoma ([place]) 2013  ? Melanoma ([place])   ? x2 stg II melanoma  ? Neuropathy, peripheral   ? Peripheral neuropathy   ? Pneumonia   ? hx of x 2  ? PONV (postoperative nausea and vomiting)   ? Pre-diabetes   ? Retina disorder   ? left  ? SCC (squamous cell carcinoma)   ? skin   ? Stroke [facility])   ? ? TIA per Dr V per patient   ?  Thyroid disease   ? Urine incontinence   ? Vitamin D deficiency   ? Weight gain   ? ? ?Past Surgical History:  ?Procedure Laterality Date  ? BILATERAL SALPINGOOPHORECTOMY    ? ovarian cyst Dr. Ouida Sills and Dr. Alisia Ferrari 2008  ? BREAST SURGERY    ? in 2014 bx breast calcifications  ? BREAST SURGERY    ? augmentation with saline Dr. Hubbard Hartshorn 1995   ? CHOLECYSTECTOMY N/A 01/28/2015  ? Procedure: LAPAROSCOPIC CHOLECYSTECTOMY WITH INTRAOPERATIVE CHOLANGIOGRAM;  Surgeon: Autumn Messing III, MD;  Location: [place];  Service: General;  Laterality: N/A;  ? CLOSED MANIPULATION SHOULDER    ? rt  ? COLONOSCOPY WITH PROPOFOL N/A 11/20/2012  ? Procedure: COLONOSCOPY WITH PROPOFOL;  Surgeon: Garlan Fair, MD;  Location: [facility];  Service: Endoscopy;  Laterality: N/A;  ? colonscopy    ? May 2014  ? JOINT REPLACEMENT    ? RTK  ? KNEE ARTHROSCOPY    ? left knee miniscus tear repair    ? MELANOMA EXCISION WITH SENTINEL LYMPH NODE BIOPSY  03/18/11  ? Back, nodes neg  ? OOPHORECTOMY    ? ROTATOR CUFF REPAIR    ? left  ? SHOULDER SURGERY    ? right and left   ? SKIN CANCER EXCISION    ? multiple  ? TOTAL HIP ARTHROPLASTY    ? TOTAL KNEE ARTHROPLASTY    ? right 2010 and left in 2018   ? TOTAL KNEE ARTHROPLASTY Left 05/16/2017  ? Procedure: LEFT TOTAL KNEE ARTHROPLASTY;  Surgeon: Paralee Cancel, MD;  Location: [facility];  Service: Orthopedics;  Laterality: Left;  70 mins  ? TUBAL LIGATION    ? TUMOR REMOVAL    ? ovary  ? ? ? ?Current Outpatient Medications:  ?  aspirin 81 MG tablet, TAKE 1 TABLET BY MOUTH EVERY DAY, Disp: , Rfl:  ?  atorvastatin (LIPITOR) 20 MG tablet, Take 1 tablet (20 mg total) by mouth daily., Disp: 90 tablet, Rfl: 3 ?  Calcium 600-200 MG-UNIT tablet, Take 1 tablet daily by mouth., Disp: , Rfl:  ?  Cholecalciferol (VITAMIN D3) 2000 UNITS capsule, Take 2,000 Units by mouth daily., Disp: , Rfl:  ?  FOLIC ACID PO, Take by mouth daily., Disp: , Rfl:  ?  levothyroxine (SYNTHROID) 75 MCG tablet, Take 1 tablet (75 mcg total) by  mouth daily before breakfast., Disp: 90 tablet, Rfl: 3 ?  olmesartan (BENICAR) 5 MG tablet, TAKE 2 TABLETS BY MOUTH IN THE MORNING AND 2 AT BEDTIME, Disp: 360 tablet, Rfl: 0 ?  Omega-3 Fatty Acids (FISH OIL) 1000 MG CAPS, Take 1,000 mg by mouth daily. , Disp: , Rfl:  ?  omeprazole (PRILOSEC) 20  MG capsule, Take 1 capsule (20 mg total) by mouth 2 (two) times daily before a meal., Disp: 60 capsule, Rfl: 11 ?  sertraline (ZOLOFT) 25 MG tablet, Take 1 tablet (25 mg total) by mouth daily., Disp: 90 tablet, Rfl: 3 ?  vitamin B-12 (CYANOCOBALAMIN) 1000 MCG tablet, Take 2,000 mcg by mouth daily. 2 tabs, Disp: , Rfl:  ?  albuterol (PROAIR HFA) 108 (90 Base) MCG/ACT inhaler, Inhale 1-2 puffs into the lungs every 6 (six) hours as needed for wheezing or shortness of breath. (Patient not taking: Reported on 10/13/2021), Disp: 18 g, Rfl: 11 ?  loperamide (IMODIUM A-D) 2 MG tablet, Take 1 tablet (2 mg total) by mouth as needed for diarrhea or loose stools. (Patient not taking: Reported on 10/13/2021), Disp: 30 tablet, Rfl: 0 ?  neomycin-polymyxin-hydrocortisone (CORTISPORIN) OTIC solution, Place 4 drops into the right ear 3 (three) times daily. Right ear pain (Patient not taking: Reported on 10/13/2021), Disp: 10 mL, Rfl: 0 ? ?EXAM: ? ?VITALS per patient if applicable: ? ?GENERAL: alert, oriented, appears well and in no acute distress ? ?PSYCH/NEURO: pleasant and cooperative, no obvious depression or anxiety, speech and thought processing grossly intact ? ?ASSESSMENT AND PLAN: ? ?Discussed the following assessment and plan: ? ?Vitamin D deficiency ?Rec D3 2000 to 4000 IU qd  ? ?Fatigue only lab from 10/11/21 was low vitamin D which could be contributing  ?HM up to date  ?Could be stress as multiple deaths over the last 3 years  ? ?Atypical chest pain ?Hiatal hernia ?If happens again rec EKG and troponin and needs f/u ? ?-we discussed possible serious and likely etiologies, options for evaluation and workup, limitations of telemedicine  visit vs in person visit, treatment, treatment risks and precautions. Pt is agreeable to treatment via telemedicine at this moment.  ? ?  ?I discussed the assessment and treatment plan with the patient. The

## 2021-10-19 DIAGNOSIS — M13842 Other specified arthritis, left hand: Secondary | ICD-10-CM | POA: Diagnosis not present

## 2021-10-19 DIAGNOSIS — M25532 Pain in left wrist: Secondary | ICD-10-CM | POA: Diagnosis not present

## 2021-10-20 IMAGING — CR DG CHEST 2V
1 series · 2 of 2 positions shown · non-contrast
Comparison: 05/31/2019

CLINICAL DATA: Chest pain, blood pressure issues, asthma, anemia,
coronary artery disease, arthritis, GERD

EXAM:
CHEST - 2 VIEW

[Series 1: dg chest 2 view · 0.14mm/px · 2 of 2 slices shown]
[im 1/2]
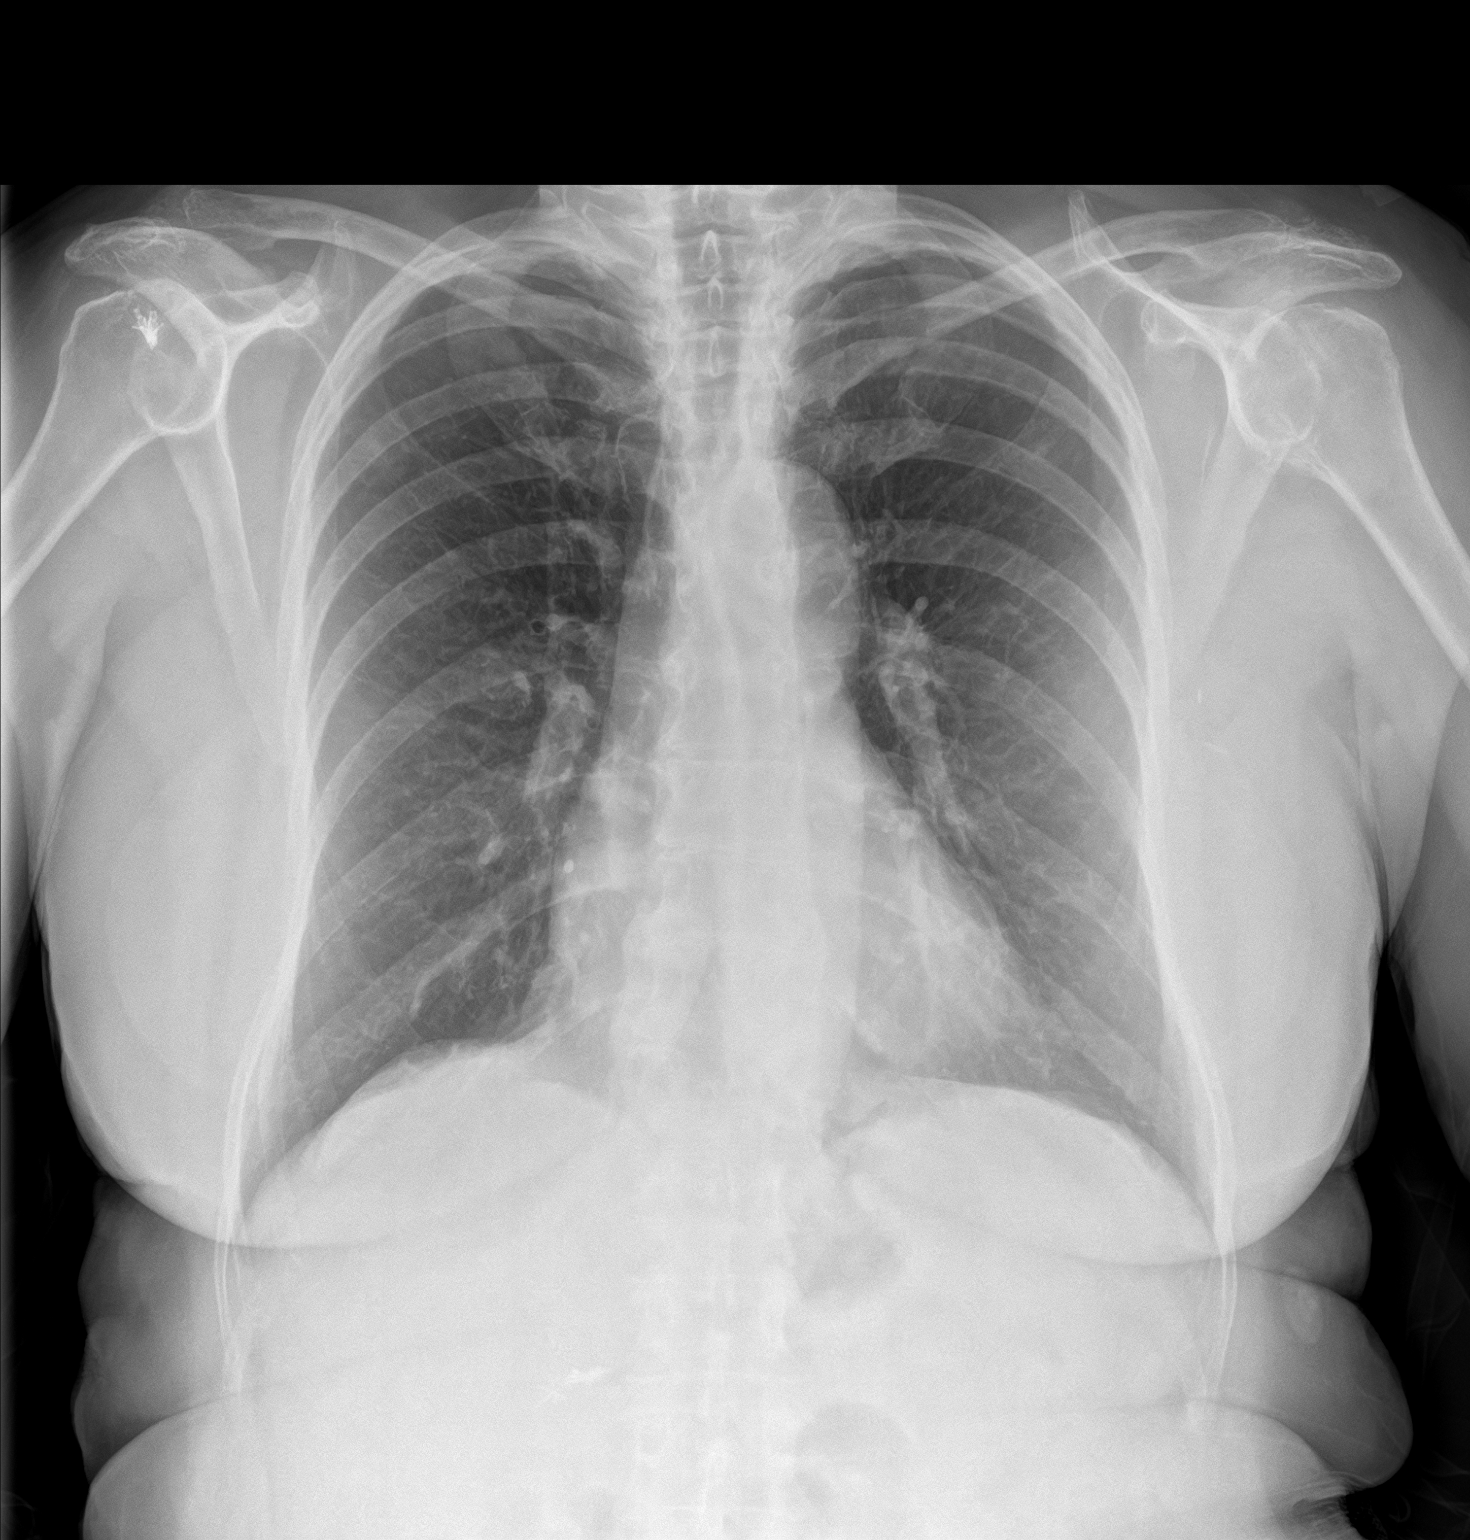
[im 2/2]
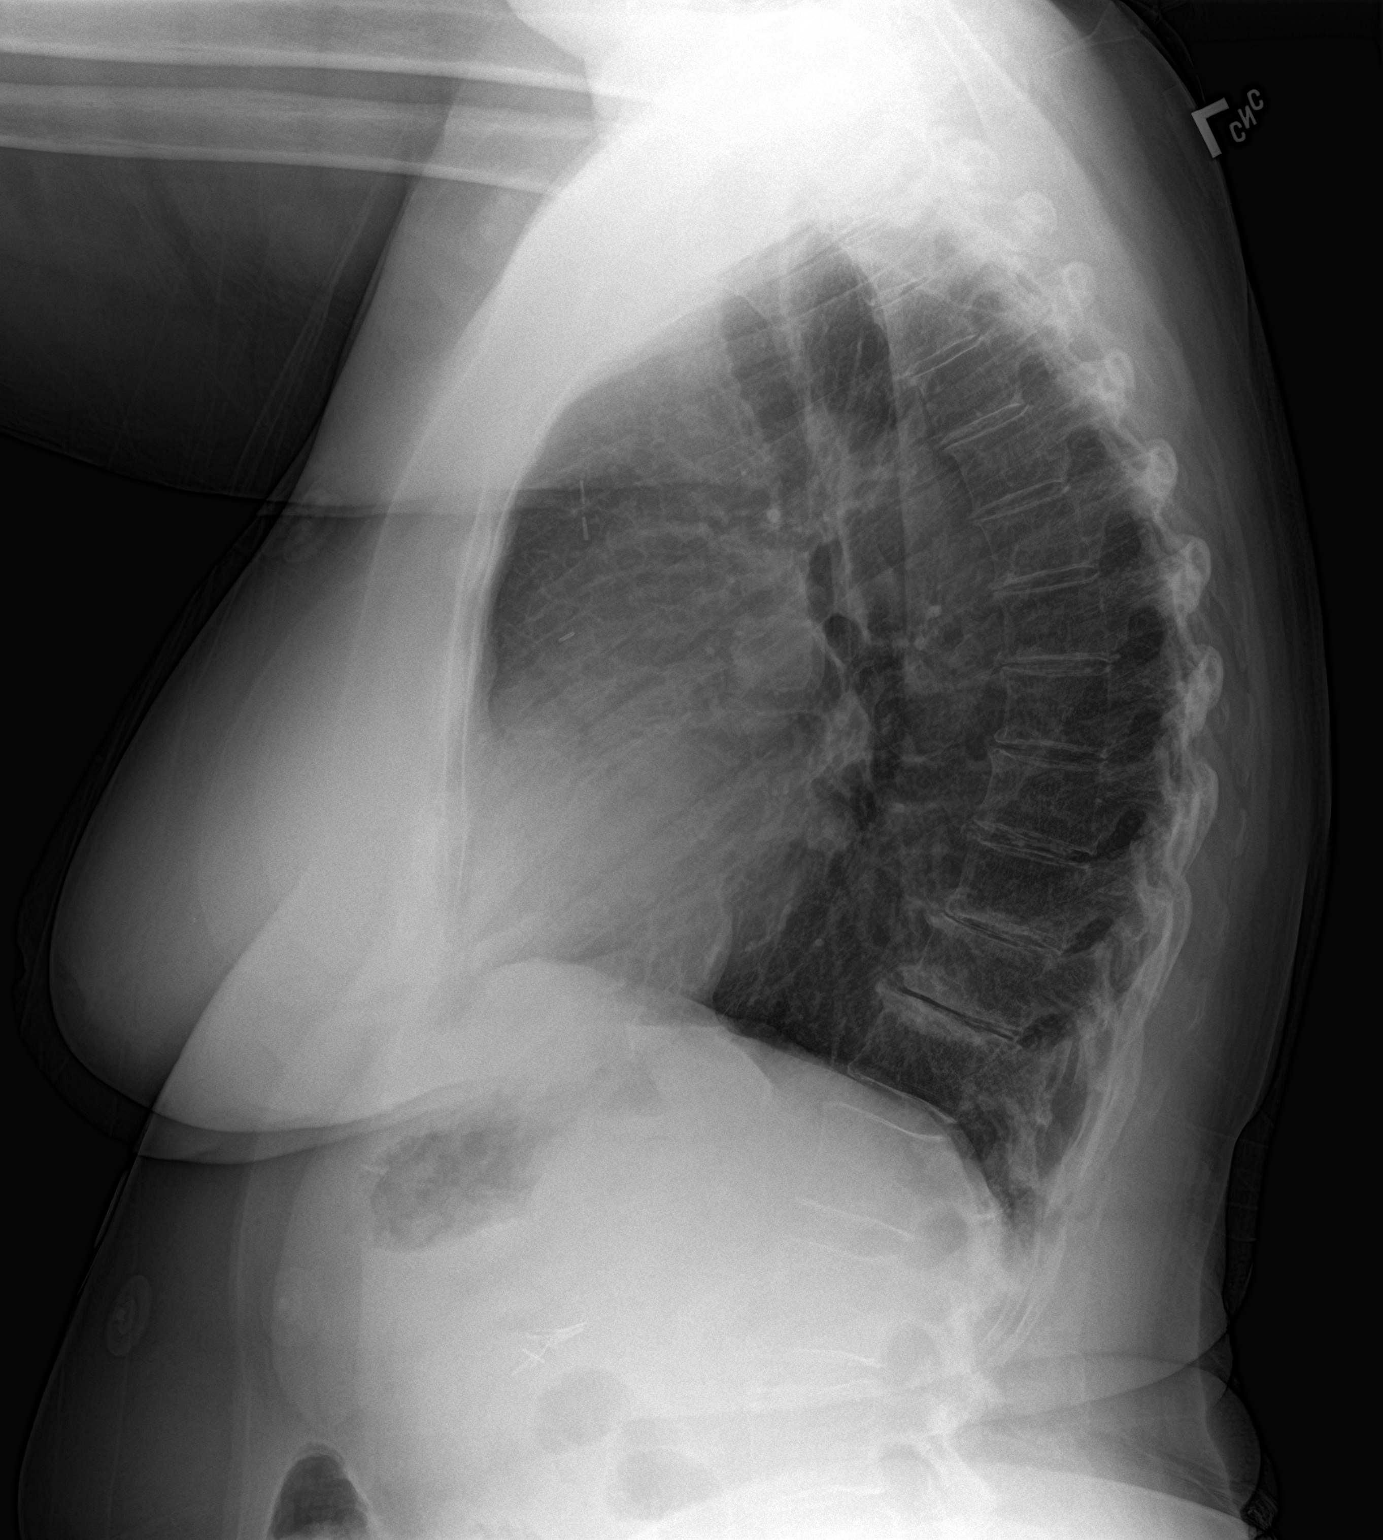

[2 of 2 positions shown; findings below may reference images not displayed]

FINDINGS: Normal heart size, mediastinal contours, and pulmonary vascularity.

Atherosclerotic calcification aorta.

Slightly hyperinflated lungs.

No pulmonary infiltrate, pleural effusion, or pneumothorax.

Breast prostheses noted.

Bones demineralized.
IMPRESSION: Hyperinflated lungs without acute infiltrate.

Aortic Atherosclerosis ([id number]).

## 2021-11-01 ENCOUNTER — Telehealth: Payer: Self-pay | Admitting: Internal Medicine

## 2021-11-01 NOTE — Telephone Encounter (Signed)
Copied from [place] [phone]. Topic: Medicare AWV ?>> Nov 01, 2021  9:20 AM Harris-Coley, Hannah Beat wrote: ?Reason for CRM: Left message for patient to schedule Annual Wellness Visit.  Please schedule with Nurse Health Advisor Denisa O'Brien-Blaney, LPN at [facility]  Please call [phone] ask for Juliann Pulse ?

## 2021-11-10 ENCOUNTER — Ambulatory Visit (INDEPENDENT_AMBULATORY_CARE_PROVIDER_SITE_OTHER): Payer: PPO

## 2021-11-10 VITALS — Ht 66.0 in | Wt 186.0 lb

## 2021-11-10 DIAGNOSIS — Z0000 Encounter for general adult medical examination without abnormal findings: Secondary | ICD-10-CM

## 2021-11-10 NOTE — Progress Notes (Signed)
Subjective:   [name] is a 81 y.o. female who presents for Medicare Annual (Subsequent) preventive examination.  Review of Systems    No ROS.  Medicare Wellness Virtual Visit.  Visual/audio telehealth visit, UTA vital signs.   See social history for additional risk factors.   Cardiac Risk Factors include: advanced age (>72men, >66 women)     Objective:    Today's Vitals   11/10/21 1242  Weight: 186 lb (84.4 kg)  Height: 5\' 6"  (1.676 m)   Body mass index is 30.02 kg/m.     11/10/2021   12:59 PM 02/24/2021    9:43 AM 12/20/2020   10:12 AM 09/04/2020   11:14 AM 08/10/2020    1:38 PM 08/10/2020   10:39 AM 05/03/2020   12:00 PM  Advanced Directives  Does Patient Have a Medical Advance Directive? Yes Yes Yes Yes Yes Yes No  Type of Estate agent of [place];Living will Healthcare Power of [place];Living will Healthcare Power of [place];Living will Healthcare Power of [place];Out of facility DNR (pink MOST or yellow form);Living will Healthcare Power of [employer];Living will Healthcare Power of [place];Living will   Does patient want to make changes to medical advance directive? No - Patient declined    No - Patient declined No - Patient declined   Copy of Healthcare Power of Attorney in Chart? Yes - validated most recent copy scanned in chart (See row information)  Yes - validated most recent copy scanned in chart (See row information)   Yes - validated most recent copy scanned in chart (See row information)   Would patient like information on creating a medical advance directive?     No - Patient declined      Current Medications (verified) Outpatient Encounter Medications as of 11/10/2021  Medication Sig   albuterol (PROAIR HFA) 108 (90 Base) MCG/ACT inhaler Inhale 1-2 puffs into the lungs every 6 (six) hours as needed for wheezing or shortness of breath. (Patient not taking: Reported on 10/13/2021)   aspirin 81 MG tablet TAKE 1 TABLET BY MOUTH EVERY  DAY   atorvastatin (LIPITOR) 20 MG tablet Take 1 tablet (20 mg total) by mouth daily.   Calcium 600-200 MG-UNIT tablet Take 1 tablet daily by mouth.   Cholecalciferol (VITAMIN D3) 2000 UNITS capsule Take 2,000 Units by mouth daily.   FOLIC ACID PO Take by mouth daily.   levothyroxine (SYNTHROID) 75 MCG tablet Take 1 tablet (75 mcg total) by mouth daily before breakfast.   loperamide (IMODIUM A-D) 2 MG tablet Take 1 tablet (2 mg total) by mouth as needed for diarrhea or loose stools. (Patient not taking: Reported on 10/13/2021)   neomycin-polymyxin-hydrocortisone (CORTISPORIN) OTIC solution Place 4 drops into the right ear 3 (three) times daily. Right ear pain (Patient not taking: Reported on 10/13/2021)   olmesartan (BENICAR) 5 MG tablet TAKE 2 TABLETS BY MOUTH IN THE MORNING AND 2 AT BEDTIME   Omega-3 Fatty Acids (FISH OIL) 1000 MG CAPS Take 1,000 mg by mouth daily.    omeprazole (PRILOSEC) 20 MG capsule Take 1 capsule (20 mg total) by mouth 2 (two) times daily before a meal.   sertraline (ZOLOFT) 25 MG tablet Take 1 tablet (25 mg total) by mouth daily.   vitamin B-12 (CYANOCOBALAMIN) 1000 MCG tablet Take 2,000 mcg by mouth daily. 2 tabs   No facility-administered encounter medications on file as of 11/10/2021.    Allergies (verified) Epinephrine, Penicillins, Lidocaine hcl, Other, and Valium [diazepam]   History: Past  Medical History:  Diagnosis Date   Acid reflux    Allergy    Anemia    Anxiety    in past   Arthritis    all over   Asthma    Atypical moles    Bronchitis    Bronchospasm    reactive with perfume/cologne and smoke   CAD (coronary artery disease)    patient denies on preop of 10/24    Candida esophagitis (HCC)    Carotid artery calcification, bilateral 04/2016   Cataract    Cervical stenosis of spine    Chronic insomnia    Complication of anesthesia    shoulder surgery- not all the way asleep   COVID-19 02/04/2021   also 10/3 or 04/20/21 sxs started 04/05/21    Depression    in past   Diverticulitis    Diverticulosis    Dizziness    Elevated liver enzymes    Family history of adverse reaction to anesthesia    sister- has problems with nausea and vomiting    Fatigue    Fatty liver    Generalized headache    migraines   GERD (gastroesophageal reflux disease)    History of chemotherapy    topical cream for h/o skin CA   History of kidney stones    History of neuropathy    FH Dr. Allena Katz neurology   History of vertigo    Hx of migraines    Hyperlipidemia    Hypothyroidism    Insomnia    Irritable bowel syndrome    LLQ abdominal pain    Melanoma (HCC) 2013   Melanoma (HCC)    x2 stg II melanoma   Neuropathy, peripheral    Peripheral neuropathy    Pneumonia    hx of x 2   PONV (postoperative nausea and vomiting)    Pre-diabetes    Retina disorder    left   SCC (squamous cell carcinoma)    skin    Stroke (HCC)    ? TIA per Dr V per patient    Thyroid disease    Urine incontinence    Vitamin D deficiency    Weight gain    Past Surgical History:  Procedure Laterality Date   BILATERAL SALPINGOOPHORECTOMY     ovarian cyst Dr. Dareen Piano and Dr. Allie Dimmer 2008   BREAST SURGERY     in 2014 bx breast calcifications   BREAST SURGERY     augmentation with saline Dr. Karmen Bongo 1995    CHOLECYSTECTOMY N/A 01/28/2015   Procedure: LAPAROSCOPIC CHOLECYSTECTOMY WITH INTRAOPERATIVE CHOLANGIOGRAM;  Surgeon: Chevis Pretty III, MD;  Location: [facility] OR;  Service: General;  Laterality: N/A;   CLOSED MANIPULATION SHOULDER     rt   COLONOSCOPY WITH PROPOFOL N/A 11/20/2012   Procedure: COLONOSCOPY WITH PROPOFOL;  Surgeon: Charolett Bumpers, MD;  Location: [facility];  Service: Endoscopy;  Laterality: N/A;   colonscopy     May 2014   JOINT REPLACEMENT     RTK   KNEE ARTHROSCOPY     left knee miniscus tear repair     MELANOMA EXCISION WITH SENTINEL LYMPH NODE BIOPSY  03/18/11   Back, nodes neg   OOPHORECTOMY     ROTATOR CUFF REPAIR     left    SHOULDER SURGERY     right and left    SKIN CANCER EXCISION     multiple   TOTAL HIP ARTHROPLASTY     TOTAL KNEE ARTHROPLASTY     right  2010 and left in 2018    TOTAL KNEE ARTHROPLASTY Left 05/16/2017   Procedure: LEFT TOTAL KNEE ARTHROPLASTY;  Surgeon: Durene Romans, MD;  Location: [facility];  Service: Orthopedics;  Laterality: Left;  70 mins   TUBAL LIGATION     TUMOR REMOVAL     ovary   Family History  Problem Relation Age of Onset   Hypertension Mother    Stroke Mother    Hyperlipidemia Mother    Heart disease Father    Diabetes Father    Hypertension Father    Hyperlipidemia Father    Early death Father    Diabetes Sister    Heart disease Sister        heart murmur   Arthritis Sister    COPD Sister    Hyperlipidemia Sister    Hypertension Sister    Colon polyps Sister        adenomous   Arthritis Sister    Heart disease Sister    Hyperlipidemia Sister    Hypertension Sister    Cancer Sister    Stroke Maternal Grandmother    Colon cancer Maternal Grandfather    Cancer Maternal Grandfather    Early death Maternal Grandfather    Early death Paternal Grandmother    Stroke Paternal Grandmother    Heart disease Daughter    Hyperlipidemia Daughter    Hypertension Daughter    Heart disease Son    Hyperlipidemia Son    Alcohol abuse Son    Depression Son    Diabetes Son    Pancreatic cancer Maternal Uncle    Alzheimer's disease Paternal Aunt    Alzheimer's disease Paternal Aunt    Alzheimer's disease Paternal Aunt    Alzheimer's disease Paternal Aunt    Cancer Other        2 paternal uncles and 1 maternal uncle colon cancer   Esophageal cancer Neg Hx    Stomach cancer Neg Hx    Social History   Socioeconomic History   Marital status: Divorced    Spouse name: Not on file   Number of children: 3   Years of education: College   Highest education level: Some college, no degree  Occupational History   Occupation: Retired    Associate Professor: RETIRED  Tobacco Use    Smoking status: Never   Smokeless tobacco: Never  Vaping Use   Vaping Use: Never used  Substance and Sexual Activity   Alcohol use: Yes    Alcohol/week: 1.0 standard drink    Types: 1 Cans of beer per week    Comment: once a week beer or wine   Drug use: No   Sexual activity: Not Currently  Other Topics Concern   Not on file  Social History Narrative   Patient lives at home alone in a one story home.   Has 3 children (2 daughters and 1 son)    Retired from [employer].   Divorced since 1992    Caffeine Use: 1 cup daily and a soft drink occasionally    No guns    Wears seat belt     Lives with cat    Left handed   Social Determinants of Health   Financial Resource Strain: Low Risk    Difficulty of Paying Living Expenses: Not hard at all  Food Insecurity: No Food Insecurity   Worried About Programme researcher, broadcasting/film/video in the Last Year: Never true   Ran Out of Food in the Last Year: Never true  Transportation Needs: No Regulatory affairs officer (Medical): No   Lack of Transportation (Non-Medical): No  Physical Activity: Sufficiently Active   Days of Exercise per Week: 6 days   Minutes of Exercise per Session: 60 min  Stress: No Stress Concern Present   Feeling of Stress : Only a little  Social Connections: Unknown   Frequency of Communication with Friends and Family: More than three times a week   Frequency of Social Gatherings with Friends and Family: Not on file   Attends Religious Services: More than 4 times per year   Active Member of [employer] or Organizations: Yes   Attends Banker Meetings: Not on file   Marital Status: Not on file    Tobacco Counseling Counseling given: Not Answered   Clinical Intake:  Pre-visit preparation completed: Yes        Diabetes: No  How often do you need to have someone help you when you read instructions, pamphlets, or other written materials from your doctor or pharmacy?: 1 -  Never    Interpreter Needed?: No      Activities of Daily Living    11/10/2021    1:00 PM  In your present state of health, do you have any difficulty performing the following activities:  Hearing? 0  Vision? 0  Difficulty concentrating or making decisions? 0  Walking or climbing stairs? 1  Comment chronic R knee pain  Dressing or bathing? 0  Doing errands, shopping? 0  Preparing Food and eating ? N  Using the Toilet? N  In the past six months, have you accidently leaked urine? N  Do you have problems with loss of bowel control? Y  Comment Followed by GI and PCP  Managing your Medications? N  Managing your Finances? N  Housekeeping or managing your Housekeeping? N    Patient Care Team: McLean-Scocuzza, Pasty Spillers, MD as PCP - General (Internal Medicine) Patient, No Pcp Per (Inactive) (General Practice) Glendale Chard, DO as Consulting Physician (Neurology)  Indicate any recent Medical Services you may have received from other than [facility] providers in the past year (date may be approximate).     Assessment:   This is a routine wellness examination for [place].  Virtual Visit via Telephone Note  I connected with  [name] on 11/10/21 at 12:30 PM EDT by telephone and verified that I am speaking with the correct person using two identifiers.  Persons participating in the virtual visit: patient/Nurse Health Advisor   I discussed the limitations of performing an evaluation and management service by telehealth. The patient expressed understanding and agreed to proceed. We continued and completed visit with audio only. Some vital signs may be absent or patient reported.   Hearing/Vision screen Hearing Screening - Comments:: Patient is able to hear conversational tones without difficulty. No issues reported.  Vision Screening - Comments:: They have seen their ophthalmologist in the last 12 months.   Dietary issues and exercise activities discussed: Current Exercise  Habits: Home exercise routine, Intensity: Mild   Goals Addressed               This Visit's Progress     Patient Stated     I want to walk more for exercise, as tolerated (pt-stated)         Depression Screen    11/10/2021   12:49 PM 05/05/2021   10:53 AM 04/20/2021    2:13 PM 04/19/2021   11:29 AM 01/12/2021  2:10 PM 12/22/2020    1:42 PM 08/17/2020    4:26 PM  PHQ 2/9 Scores  PHQ - 2 Score 0 0 0 0 1 0 2  PHQ- 9 Score       7    Fall Risk    11/10/2021   12:59 PM 05/05/2021   10:53 AM 04/20/2021    2:13 PM 04/19/2021   11:28 AM 02/16/2021    2:45 PM  Fall Risk   Falls in the past year? 0 1 0 0 1  Number falls in past yr: 0 0 0 0 0  Injury with Fall?  0 0  0  Risk for fall due to :     History of fall(s);Impaired balance/gait  Follow up Falls evaluation completed Falls evaluation completed Falls evaluation completed Falls evaluation completed Falls evaluation completed    FALL RISK PREVENTION PERTAINING TO THE HOME: Home free of loose throw rugs in walkways, pet beds, electrical cords, etc? Yes  Adequate lighting in your home to reduce risk of falls? Yes   ASSISTIVE DEVICES UTILIZED TO PREVENT FALLS: Life alert? No  Use of a cane, walker or w/c? Yes  Grab bars in the bathroom? Yes  Shower chair or bench in shower? Yes  Comfort chair height toilet? Yes   TIMED UP AND GO: Was the test performed? No .   Cognitive Function: Patient is alert and oriented x3.     10/31/2016    3:00 PM 04/27/2016   11:00 AM  MMSE - Mini Mental State Exam  Not completed: Refused Unable to complete      09/22/2017    4:19 PM 12/17/2015    4:07 PM 12/17/2015    3:41 PM  Montreal Cognitive Assessment   Visuospatial/ Executive (0/5) 4 4 4   Naming (0/3) 3 3 3   Attention: Read list of digits (0/2) 2 2 2   Attention: Read list of letters (0/1) 1 1 1   Attention: Serial 7 subtraction starting at 100 (0/3) 2 3 3   Language: Repeat phrase (0/2) 2 2 2   Language : Fluency (0/1) 0 1 1   Abstraction (0/2) 2 2 2   Delayed Recall (0/5) 4 3 3   Orientation (0/6) 6 6 6   Total 26 27 27   Adjusted Score (based on education) 26  27      08/10/2020   11:04 AM 08/08/2019   10:53 AM  6CIT Screen  What Year? 0 points 0 points  What month? 0 points 0 points  What time? 0 points 0 points  Count back from 20 0 points 0 points  Months in reverse 0 points 0 points  Repeat phrase 0 points 0 points  Total Score 0 points 0 points    Immunizations Immunization History  Administered Date(s) Administered   Fluad Quad(high Dose 65+) 04/25/2019, 04/14/2020, 06/24/2021   Influenza Split 04/18/2017   Influenza, High Dose Seasonal PF 04/18/2017, 04/12/2018   Influenza-Unspecified 05/03/2018   PFIZER Comirnaty(Gray Top)Covid-19 Tri-Sucrose Vaccine 12/03/2020   PFIZER(Purple Top)SARS-COV-2 Vaccination 08/12/2019, 09/02/2019, 03/16/2020   Pfizer Covid-19 Vaccine Bivalent Booster 56yrs & up 08/09/2021   Pneumococcal Conjugate-13 05/15/2014   Pneumococcal Polysaccharide-23 12/16/2005, 05/18/2016   Td 10/18/2012   Zoster Recombinat (Shingrix) 06/16/2011   Shingrix Completed?: No.    Education has been provided regarding the importance of this vaccine. Patient has been advised to call insurance company to determine out of pocket expense if they have not yet received this vaccine. Advised may also receive vaccine at local pharmacy or Health  Dept. Verbalized acceptance and understanding.  Screening Tests Health Maintenance  Topic Date Due   Zoster Vaccines- Shingrix (2 of 2) 08/11/2011   INFLUENZA VACCINE  02/15/2022   TETANUS/TDAP  10/19/2022   Pneumonia Vaccine 14+ Years old  Completed   DEXA SCAN  Completed   COVID-19 Vaccine  Completed   HPV VACCINES  Aged Out   Health Maintenance Health Maintenance Due  Topic Date Due   Zoster Vaccines- Shingrix (2 of 2) 08/11/2011   Mammogram- due 01/2022.   Lung Cancer Screening: (Low Dose CT Chest recommended if Age 84-80 years, 30 pack-year  currently smoking OR have quit w/in 15years.) does not qualify.   Hepatitis C Screening: does not qualify.  Vision Screening: Recommended annual ophthalmology exams for early detection of glaucoma and other disorders of the eye.  Dental Screening: Recommended annual dental exams for proper oral hygiene  Community Resource Referral / Chronic Care Management: CRR required this visit?  No   CCM required this visit?  No      Plan:   Keep all routine maintenance appointments.   I have personally reviewed and noted the following in the patient's chart:   Medical and social history Use of alcohol, tobacco or illicit drugs  Current medications and supplements including opioid prescriptions.  Functional ability and status Nutritional status Physical activity Advanced directives List of other physicians Hospitalizations, surgeries, and ER visits in previous 12 months Vitals Screenings to include cognitive, depression, and falls Referrals and appointments  In addition, I have reviewed and discussed with patient certain preventive protocols, quality metrics, and best practice recommendations. A written personalized care plan for preventive services as well as general preventive health recommendations were provided to patient.     Ashok Pall, LPN   1/61/0960

## 2021-11-10 NOTE — Patient Instructions (Addendum)
?  Ms. Bobo , ?Thank you for taking time to come for your Medicare Wellness Visit. I appreciate your ongoing commitment to your health goals. Please review the following plan we discussed and let me know if I can assist you in the future.  ? ?These are the goals we discussed: ? Goals   ? ?  ? Patient Stated  ?   I want to walk more for exercise, as tolerated (pt-stated)   ? ?  ?  ?This is a list of the screening recommended for you and due dates:  ?Health Maintenance  ?Topic Date Due  ? Zoster (Shingles) Vaccine (2 of 2) 08/11/2011  ? Flu Shot  02/15/2022  ? Tetanus Vaccine  10/19/2022  ? Pneumonia Vaccine  Completed  ? DEXA scan (bone density measurement)  Completed  ? COVID-19 Vaccine  Completed  ? HPV Vaccine  Aged Out  ?  ?

## 2021-11-16 DIAGNOSIS — M25561 Pain in right knee: Secondary | ICD-10-CM | POA: Diagnosis not present

## 2021-11-16 DIAGNOSIS — M25562 Pain in left knee: Secondary | ICD-10-CM | POA: Diagnosis not present

## 2021-11-17 ENCOUNTER — Telehealth: Payer: Self-pay

## 2021-11-17 ENCOUNTER — Other Ambulatory Visit: Payer: Self-pay | Admitting: Internal Medicine

## 2021-11-17 DIAGNOSIS — I10 Essential (primary) hypertension: Secondary | ICD-10-CM

## 2021-11-17 DIAGNOSIS — F339 Major depressive disorder, recurrent, unspecified: Secondary | ICD-10-CM

## 2021-11-17 DIAGNOSIS — F419 Anxiety disorder, unspecified: Secondary | ICD-10-CM

## 2021-11-17 DIAGNOSIS — E785 Hyperlipidemia, unspecified: Secondary | ICD-10-CM

## 2021-11-17 MED ORDER — SERTRALINE HCL 25 MG PO TABS: 25.0000 mg | ORAL_TABLET | Freq: Every day | ORAL | 3 refills | Status: DC

## 2021-11-17 MED ORDER — OLMESARTAN MEDOXOMIL 5 MG PO TABS: ORAL_TABLET | ORAL | 3 refills | Status: DC

## 2021-11-17 MED ORDER — ATORVASTATIN CALCIUM 20 MG PO TABS: 20.0000 mg | ORAL_TABLET | Freq: Every day | ORAL | 0 refills | Status: DC

## 2021-11-17 NOTE — Telephone Encounter (Signed)
Patient called to say she spoke with her pharmacy to request a refill on her atorvastatin and sertraline.  Patient said her pharmacy said they will send the request to [name] and they also asked her to call us as well. ? ?*Patient's preferred pharmacy is [facility] on [address]. ?

## 2021-11-18 DIAGNOSIS — L570 Actinic keratosis: Secondary | ICD-10-CM | POA: Diagnosis not present

## 2021-11-18 DIAGNOSIS — Z85828 Personal history of other malignant neoplasm of skin: Secondary | ICD-10-CM | POA: Diagnosis not present

## 2021-11-18 DIAGNOSIS — L821 Other seborrheic keratosis: Secondary | ICD-10-CM | POA: Diagnosis not present

## 2021-11-18 DIAGNOSIS — Z87898 Personal history of other specified conditions: Secondary | ICD-10-CM | POA: Diagnosis not present

## 2021-11-18 DIAGNOSIS — L578 Other skin changes due to chronic exposure to nonionizing radiation: Secondary | ICD-10-CM | POA: Diagnosis not present

## 2021-11-18 DIAGNOSIS — D225 Melanocytic nevi of trunk: Secondary | ICD-10-CM | POA: Diagnosis not present

## 2021-11-18 DIAGNOSIS — Z85820 Personal history of malignant melanoma of skin: Secondary | ICD-10-CM | POA: Diagnosis not present

## 2021-11-18 DIAGNOSIS — D485 Neoplasm of uncertain behavior of skin: Secondary | ICD-10-CM | POA: Diagnosis not present

## 2021-11-18 DIAGNOSIS — Z808 Family history of malignant neoplasm of other organs or systems: Secondary | ICD-10-CM | POA: Diagnosis not present

## 2021-12-04 ENCOUNTER — Other Ambulatory Visit: Payer: Self-pay | Admitting: Family Medicine

## 2021-12-04 DIAGNOSIS — I10 Essential (primary) hypertension: Secondary | ICD-10-CM

## 2021-12-16 ENCOUNTER — Ambulatory Visit: Payer: PPO | Admitting: Pulmonary Disease

## 2021-12-16 ENCOUNTER — Encounter: Payer: Self-pay | Admitting: Pulmonary Disease

## 2021-12-16 VITALS — BP 110/78 | HR 69 | Temp 97.7°F | Ht 66.0 in | Wt 192.6 lb

## 2021-12-16 DIAGNOSIS — J989 Respiratory disorder, unspecified: Secondary | ICD-10-CM

## 2021-12-16 DIAGNOSIS — K449 Diaphragmatic hernia without obstruction or gangrene: Secondary | ICD-10-CM

## 2021-12-16 DIAGNOSIS — K219 Gastro-esophageal reflux disease without esophagitis: Secondary | ICD-10-CM | POA: Diagnosis not present

## 2021-12-16 DIAGNOSIS — J683 Other acute and subacute respiratory conditions due to chemicals, gases, fumes and vapors: Secondary | ICD-10-CM

## 2021-12-16 NOTE — Progress Notes (Unsigned)
Subjective:    Patient ID: [name], female    DOB: 1941/03/29, 81 y.o.   MRN: [id number] Patient Care Team: McLean-Scocuzza, Nino Glow, MD as PCP - General (Internal Medicine) Patient, No Pcp Per (Inactive) (General Practice) Alda Berthold, DO as Consulting Physician (Neurology)  Chief Complaint  Patient presents with   Follow-up    Developed bronchitis 2wek ago--sx have improved but have not completely subside. C/o nasal drainage, dry cough and occ wheezing after coughing spell.     HPI    Review of Systems A 10 point review of systems was performed and it is as noted above otherwise negative.  Patient Active Problem List   Diagnosis Date Noted   Vitamin D deficiency 10/12/2021   Urinary incontinence 05/17/2021   Respiratory illness 04/19/2021   TMJ dysfunction 03/04/2021   Anxiety and depression 01/12/2021   Insomnia 01/12/2021   Fatigue 01/12/2021   Varicose veins of leg with pain, bilateral 10/06/2020   Cerebrovascular accident (CVA) ([place]) 09/22/2020   Mid back pain 09/22/2020   Lymphedema 08/21/2020   CAD (coronary artery disease) 07/20/2020   Abnormal CT of the chest 05/07/2020   Dyspnea on exertion 05/07/2020   Dysplastic nevus 02/28/2020   Diverticulosis 02/28/2020   Obesity (BMI 30-39.9) 02/28/2020   SCCA (squamous cell carcinoma) of skin 01/03/2020   Dry eyes 12/05/2019   Arthritis of hand 12/03/2019   Carpal tunnel syndrome of left wrist 12/03/2019   Scarring of lung 11/01/2019   Liver cyst 11/01/2019   Essential hypertension 11/01/2019   Carotid artery stenosis 08/13/2019   Cervical spinal stenosis 08/13/2019   Skin cancer 07/17/2019   Leg wound, left 05/14/2019   Overactive bladder 03/08/2019   Chronic cough 02/19/2019   Irritable bowel syndrome with diarrhea 02/19/2019   Depression, recurrent ([place]) 02/19/2019   Lumbosacral spondylosis without myelopathy 12/06/2018   Flushing 10/27/2018   Aortic atherosclerosis ([place]) 10/10/2018   Chest  tightness 10/10/2018   Idiopathic neuropathy 09/13/2018   History of stroke 09/13/2018   History of squamous cell carcinoma of skin 09/13/2018   History of depression 09/13/2018   History of migraine 09/13/2018   Fatty liver 09/13/2018   GERD (gastroesophageal reflux disease) 09/13/2018   History of memory loss 09/13/2018   Prediabetes 09/13/2018   Hiatal hernia 09/13/2018   Retinal drusen of both eyes 12/14/2017   Asthma in remission 05/19/2017   Primary osteoarthritis of left knee 05/19/2017   Female stress incontinence 05/08/2017   Pelvic pain 05/08/2017   Coronary artery calcification seen on CAT scan 04/09/2017   Carotid stenosis, asymptomatic, bilateral 04/09/2017   Hyperlipidemia 04/09/2017   MCI (mild cognitive impairment) 12/17/2015   Urinary frequency 08/09/2015   Hypothyroidism 08/09/2015   Syncope 08/08/2015   Posterior vitreous detachment of right eye 07/23/2015   Hereditary and idiopathic peripheral neuropathy 05/11/2015   Epiretinal membrane, left 04/12/2012   Nuclear cataract 04/12/2012   Age-related nuclear cataract, bilateral 04/12/2012   Breast pain in female 07/18/2011   Melanoma of back ([place]) 02/28/2011   Social History   Tobacco Use   Smoking status: Never   Smokeless tobacco: Never  Substance Use Topics   Alcohol use: Yes    Alcohol/week: 1.0 standard drink    Types: 1 Cans of beer per week    Comment: once a week beer or wine   Allergies  Allergen Reactions   Epinephrine Other (See Comments) and Anaphylaxis    Heart palpitations   Penicillins Swelling and Other (See Comments)  Lip swelling Has patient had a PCN reaction causing immediate rash, facial/tongue/throat swelling, SOB or lightheadedness with hypotension: Yes Has patient had a PCN reaction causing severe rash involving mucus membranes or skin necrosis: No Has patient had a PCN reaction that required hospitalization No Has patient had a PCN reaction occurring within the last 10  years: No If all of the above answers are "NO", then may proceed with Cephalosporin use.   Lidocaine Hcl Other (See Comments)   Other     Black pepper- caused bronchial tubes to start closing    Valium [Diazepam] Other (See Comments)    Other reaction(s): Other (See Comments) Overly sensitive  sedation   Current Meds  Medication Sig   albuterol (PROAIR HFA) 108 (90 Base) MCG/ACT inhaler Inhale 1-2 puffs into the lungs every 6 (six) hours as needed for wheezing or shortness of breath.   aspirin 81 MG tablet TAKE 1 TABLET BY MOUTH EVERY DAY   atorvastatin (LIPITOR) 20 MG tablet Take 1 tablet (20 mg total) by mouth daily.   Calcium 600-200 MG-UNIT tablet Take 1 tablet daily by mouth.   Cholecalciferol (VITAMIN D3) 2000 UNITS capsule Take 2,000 Units by mouth daily.   FOLIC ACID PO Take by mouth daily.   levothyroxine (SYNTHROID) 75 MCG tablet Take 1 tablet (75 mcg total) by mouth daily before breakfast.   loperamide (IMODIUM A-D) 2 MG tablet Take 1 tablet (2 mg total) by mouth as needed for diarrhea or loose stools.   neomycin-polymyxin-hydrocortisone (CORTISPORIN) OTIC solution Place 4 drops into the right ear 3 (three) times daily. Right ear pain   olmesartan (BENICAR) 5 MG tablet TAKE 2 TABLETS BY MOUTH IN THE MORNING AND 2 AT BEDTIME   Omega-3 Fatty Acids (FISH OIL) 1000 MG CAPS Take 1,000 mg by mouth daily.    omeprazole (PRILOSEC) 20 MG capsule Take 1 capsule (20 mg total) by mouth 2 (two) times daily before a meal.   sertraline (ZOLOFT) 25 MG tablet Take 1 tablet (25 mg total) by mouth daily.   vitamin B-12 (CYANOCOBALAMIN) 1000 MCG tablet Take 2,000 mcg by mouth daily. 2 tabs   Immunization History  Administered Date(s) Administered   Fluad Quad(high Dose 65+) 04/25/2019, 04/14/2020, 06/24/2021   Influenza Split 04/18/2017   Influenza, High Dose Seasonal PF 04/18/2017, 04/12/2018   Influenza-Unspecified 05/03/2018   PFIZER Comirnaty(Gray Top)Covid-19 Tri-Sucrose Vaccine  12/03/2020   PFIZER(Purple Top)SARS-COV-2 Vaccination 08/12/2019, 09/02/2019, 03/16/2020   Pfizer Covid-19 Vaccine Bivalent Booster 32yr & up 08/09/2021   Pneumococcal Conjugate-13 05/15/2014   Pneumococcal Polysaccharide-23 12/16/2005, 05/18/2016   Td 10/18/2012   Zoster Recombinat (Shingrix) 06/16/2011        Objective:   Physical Exam BP 110/78 (BP Location: Left Arm, Cuff Size: Normal)   Pulse 69   Temp 97.7 F (36.5 C) (Temporal)   Ht '5\' 6"'$  (1.676 m)   Wt 192 lb 9.6 oz (87.4 kg)   SpO2 95%   BMI 31.09 kg/m  GENERAL: Well-developed somewhat overweight female in no acute distress.  Ambulates with assistance of a cane.  No conversational dyspnea.  Not toxic-appearing. HEAD: Normocephalic, atraumatic. EYES: Pupils equal, round, reactive to light.  No scleral icterus. MOUTH: Oral mucosa moist, no thrush. NECK: Supple. No thyromegaly. Trachea midline. No JVD.  No adenopathy. PULMONARY: Good air entry bilaterally. Somewhat coarse, no other adventitious sounds. CARDIOVASCULAR: S1 and S2. Regular rate and rhythm.  No rubs, murmurs or gallops heard. ABDOMEN: Obese otherwise, benign. MUSCULOSKELETAL: No joint deformity, no clubbing, no edema.  NEUROLOGIC: No focal deficits, no gait disturbance.  Speech is fluent. SKIN: Intact,warm,dry. PSYCH: Anxious mood, normal behavior.     Assessment & Plan:

## 2021-12-16 NOTE — Patient Instructions (Signed)
We will see you back in 6 months time.  Let us know if your cough returns or if you develop any issues with shortness of breath.  I think that what you had was either virus or allergies.  I think you are getting better so I do not think you need any more medication at present.

## 2021-12-20 ENCOUNTER — Other Ambulatory Visit: Payer: Self-pay | Admitting: Internal Medicine

## 2021-12-20 ENCOUNTER — Telehealth: Payer: Self-pay

## 2021-12-20 DIAGNOSIS — J8410 Pulmonary fibrosis, unspecified: Secondary | ICD-10-CM

## 2021-12-20 DIAGNOSIS — J9801 Acute bronchospasm: Secondary | ICD-10-CM

## 2021-12-20 DIAGNOSIS — J984 Other disorders of lung: Secondary | ICD-10-CM

## 2021-12-20 DIAGNOSIS — R0602 Shortness of breath: Secondary | ICD-10-CM

## 2021-12-20 MED ORDER — ALBUTEROL SULFATE HFA 108 (90 BASE) MCG/ACT IN AERS: 1.0000 | INHALATION_SPRAY | Freq: Four times a day (QID) | RESPIRATORY_TRACT | 11 refills | Status: DC | PRN

## 2021-12-20 NOTE — Telephone Encounter (Signed)
Patient states she needs a refill for her albuterol (PROAIR HFA) 108 (90 Base) MCG/ACT inhaler (emergency inhaler).  Patient states that she has changed pharmacies.  Patient states this has previously been called in to [facility], but now she would like to have it sent to [facility] on [address].

## 2021-12-21 NOTE — Telephone Encounter (Signed)
Pt calling in again about medication (albuterol (PROAIR HFA) 108 (90 Base) MCG/ACT inhaler)... Pt stated that she has change pharmacy... See below message... Pt requesting callback

## 2022-01-04 ENCOUNTER — Telehealth: Payer: Self-pay | Admitting: Internal Medicine

## 2022-01-04 NOTE — Telephone Encounter (Signed)
Patient called and stated she has had diarrhea for years. She wanted to know if Dr Aundra Dubin has ever tested her for Crohns Disease or Colitis. Patient has not seen Dr Aundra Dubin since March 2023. Front Office tried to schedule appointment to come in and discuss with provider. Patient insisted on speaking to Dr Marica Otter assistant.

## 2022-01-09 DIAGNOSIS — R21 Rash and other nonspecific skin eruption: Secondary | ICD-10-CM | POA: Diagnosis not present

## 2022-01-09 DIAGNOSIS — R238 Other skin changes: Secondary | ICD-10-CM | POA: Diagnosis not present

## 2022-01-11 ENCOUNTER — Encounter: Payer: Self-pay | Admitting: Pulmonary Disease

## 2022-01-12 ENCOUNTER — Ambulatory Visit (INDEPENDENT_AMBULATORY_CARE_PROVIDER_SITE_OTHER): Payer: PPO | Admitting: Internal Medicine

## 2022-01-12 ENCOUNTER — Encounter: Payer: Self-pay | Admitting: Internal Medicine

## 2022-01-12 ENCOUNTER — Telehealth: Payer: Self-pay | Admitting: Internal Medicine

## 2022-01-12 VITALS — BP 120/70 | HR 64 | Temp 98.2°F | Resp 14 | Ht 66.0 in | Wt 194.8 lb

## 2022-01-12 DIAGNOSIS — E785 Hyperlipidemia, unspecified: Secondary | ICD-10-CM | POA: Diagnosis not present

## 2022-01-12 DIAGNOSIS — I872 Venous insufficiency (chronic) (peripheral): Secondary | ICD-10-CM

## 2022-01-12 DIAGNOSIS — L03119 Cellulitis of unspecified part of limb: Secondary | ICD-10-CM | POA: Diagnosis not present

## 2022-01-12 DIAGNOSIS — F32A Depression, unspecified: Secondary | ICD-10-CM | POA: Diagnosis not present

## 2022-01-12 DIAGNOSIS — E039 Hypothyroidism, unspecified: Secondary | ICD-10-CM | POA: Diagnosis not present

## 2022-01-12 DIAGNOSIS — Z23 Encounter for immunization: Secondary | ICD-10-CM

## 2022-01-12 DIAGNOSIS — Z1231 Encounter for screening mammogram for malignant neoplasm of breast: Secondary | ICD-10-CM | POA: Diagnosis not present

## 2022-01-12 DIAGNOSIS — I10 Essential (primary) hypertension: Secondary | ICD-10-CM

## 2022-01-12 DIAGNOSIS — F419 Anxiety disorder, unspecified: Secondary | ICD-10-CM | POA: Diagnosis not present

## 2022-01-12 DIAGNOSIS — R221 Localized swelling, mass and lump, neck: Secondary | ICD-10-CM | POA: Diagnosis not present

## 2022-01-12 MED ORDER — OMEPRAZOLE 20 MG PO CPDR: 20.0000 mg | DELAYED_RELEASE_CAPSULE | Freq: Two times a day (BID) | ORAL | 3 refills | Status: DC

## 2022-01-12 MED ORDER — VENLAFAXINE HCL ER 37.5 MG PO CP24: 37.5000 mg | ORAL_CAPSULE | Freq: Every day | ORAL | 3 refills | Status: DC

## 2022-01-12 MED ORDER — OLMESARTAN MEDOXOMIL 20 MG PO TABS
10.0000 mg | ORAL_TABLET | Freq: Two times a day (BID) | ORAL | 3 refills | Status: DC
Start: 2022-01-12 — End: 2023-02-20

## 2022-01-12 MED ORDER — DOXYCYCLINE HYCLATE 100 MG PO TABS: 100.0000 mg | ORAL_TABLET | Freq: Two times a day (BID) | ORAL | 0 refills | Status: DC

## 2022-01-12 MED ORDER — ATORVASTATIN CALCIUM 20 MG PO TABS: 20.0000 mg | ORAL_TABLET | Freq: Every day | ORAL | 3 refills | Status: DC

## 2022-01-12 MED ORDER — SHINGRIX 50 MCG/0.5ML IM SUSR: 0.5000 mL | Freq: Once | INTRAMUSCULAR | 1 refills | Status: AC

## 2022-01-12 MED ORDER — TRIAMCINOLONE ACETONIDE 0.1 % EX CREA: 1.0000 | TOPICAL_CREAM | Freq: Two times a day (BID) | CUTANEOUS | 0 refills | Status: DC

## 2022-01-12 MED ORDER — LEVOTHYROXINE SODIUM 75 MCG PO TABS: 75.0000 ug | ORAL_TABLET | Freq: Every day | ORAL | 3 refills | Status: DC

## 2022-01-12 NOTE — Patient Instructions (Addendum)
Tdap due 10/19/2022  Consider shingrix vaccine   Take zoloft 25 every other day x 1 week (4 dose) then stop and start effexor 37.5 mg daily for mood  [place] 4.7 6 [employer] [facility] in [place], [place] Address: [address], [place], [place] [address] Hours:  Open now    Add full hours    Phone: [phone] Stasis Dermatitis Stasis dermatitis is a long-term (chronic) skin condition that happens when veins can no longer pump blood back to the heart (poor circulation). This condition causes a red or brown scaly rash or sores (ulcers) from the pooling of blood (stasis). This condition usually affects the lower legs. It may affect one leg or both legs. Without treatment, severe stasis dermatitis can lead to other skin conditions and infections. What are the causes? This condition is caused by poor circulation. What increases the risk? You are more likely to develop this condition if: You are not very active. You stand for long periods of time. You have veins that have become enlarged and twisted (varicose veins). You have leg veins that are not strong enough to send blood back to the heart (venous insufficiency). You have had a blood clot. You have been pregnant many times. You have had vein surgery. You are obese. You have heart or kidney failure. You are 51 years of age or older. You have had injuries to your legs in the past. What are the signs or symptoms? Common early symptoms of this condition include: Itchiness in one or both of your legs. Swelling in your ankle or leg. This might get better overnight but be worse again during the day. Skin that looks thin on your ankle and leg. Red or brown marks that develop slowly. Skin that is dry, cracked, or easily irritated. Red, swollen skin that is sore or has a burning feeling. An achy or heavy feeling after you walk or stand for long  periods of time. Pain. Later and more severe symptoms of this condition include: Skin that looks shiny. Small, open sores (ulcers). These are often red or purple and leak fluid. Skin that feels hard. Severe itching. A change in the shape or color of your lower legs. Severe pain. Difficulty walking. How is this diagnosed? This condition may be diagnosed based on: Your symptoms and medical history. A physical exam. You may also have tests, including: Blood tests. Imaging tests to check blood flow (Doppler ultrasound). Allergy tests. You may need to see a health care provider who specializes in skin diseases (dermatologist). How is this treated? This condition may be treated with: Compression stockings or an elastic wrap to improve circulation. Medicines, such as: Corticosteroid creams and ointments. Non-corticosteroid medicines applied to the skin (topical). Medicine to reduce swelling in the legs (diuretics). Antibiotics. Medicine to relieve itching (antihistamines). A bandage (dressing). A wrap that contains zinc and gelatin (Unna boot). Follow these instructions at home: Skin care Moisturize your skin as told by your health care provider. Do not use moisturizers with fragrance. This can irritate your skin. Apply a cool, wet cloth (cool compress) to the affected areas. Do not scratch your skin. Do not rub your skin dry after a bath or shower. Gently pat your skin dry. Do not use scented soaps, detergents, or perfumes. Medicines Take or use over-the-counter and prescription medicines only as told by your health care provider. If you were prescribed an antibiotic medicine, take or use it as told by  your health care provider. Do not stop taking or using the antibiotic even if your condition improves. Activity Walk as told by your health care provider. Walking increases blood flow. Do calf and ankle exercises throughout the day as told by your health care provider. This will  help increase blood flow. Raise (elevate) your legs above the level of your heart when you are sitting or lying down. Lifestyle Work with your health care provider to lose weight, if needed. Do not cross your legs when you sit. Do not stand or sit in one position for long periods of time. Wear comfortable, loose-fitting clothing. Circulation in your legs will be worse if you wear tight pants, belts, and waistbands. Do not use any products that contain nicotine or tobacco, such as cigarettes, e-cigarettes, and chewing tobacco. If you need help quitting, ask your health care provider. General instructions If you were asked to use one of the following to help with your condition, follow instructions from your health care provider on how to: Remove and change any dressing. Wear compression stockings. These stockings help to prevent blood clots and reduce swelling in your legs. Wear the [employer]. Keep all follow-up visits as told by your health care provider. This is important. Contact a health care provider if: Your condition does not improve with treatment. Your condition gets worse. You have signs of infection in the affected area. Watch for: Swelling. Tenderness. Redness. Soreness. Warmth. You have a fever. Get help right away if: You notice red streaks coming from the affected area. Your bone or joint underneath the affected area becomes painful after the skin has healed. The affected area turns darker. You feel a deep pain in your leg or groin. You are short of breath. Summary Stasis dermatitis is a long-term (chronic) skin condition that happens when veins can no longer pump blood back to the heart (poor circulation). Wear compression stockings as told by your health care provider. These stockings help to prevent blood clots and reduce swelling in your legs. Follow instructions from your health care provider about activity, medicines, and lifestyle. Contact a health care provider  if you have a fever or have signs of infection in the affected area. Keep all follow-up visits as told by your health care provider. This is important. This information is not intended to replace advice given to you by your health care provider. Make sure you discuss any questions you have with your health care provider. Document Revised: 09/14/2020 Document Reviewed: 09/14/2020 Elsevier Patient Education  [place].

## 2022-01-12 NOTE — Progress Notes (Signed)
Chief Complaint  Patient presents with   Diarrhea    Pt arrives asking to be tested for chrons disease or colitis, she has been taking cloespid which has improved her diarrhea.   Thyroid Nodule    Underneath jaw, has been there for awhile. Not bothersome   F/u  1. Diarrhea chronically Friday 01/07/22 started taking colestid 1 mg bid and helping 1 day In church could barely make if before almost having accident she wants to know if has UC/chrons she is unable to do colonoscopy so disc this cant be worked up  She takes prn immodium as well Will change zoloft to effexor to see if this help  2. C/o redness to legs in 2022 had ablation of veins and has vein reflux and chronic edema b/l legs uses lymph pumps bid x 1 hour and having itching at times to legs  3. C/o neck mass left side below chin larger than right     Review of Systems  Constitutional:  Negative for weight loss.  HENT:  Negative for hearing loss.   Eyes:  Negative for blurred vision.  Respiratory:  Negative for shortness of breath.   Cardiovascular:  Negative for chest pain.  Gastrointestinal:  Negative for abdominal pain and blood in stool.  Genitourinary:  Negative for dysuria.  Musculoskeletal:  Negative for falls and joint pain.  Skin:  Negative for rash.  Neurological:  Negative for headaches.  Psychiatric/Behavioral:  Negative for depression.    Past Medical History:  Diagnosis Date   Acid reflux    Allergy    Anemia    Anxiety    in past   Arthritis    all over   Asthma    Atypical moles    Bronchitis    Bronchospasm    reactive with perfume/cologne and smoke   CAD (coronary artery disease)    patient denies on preop of 10/24    Candida esophagitis (HCC)    Carotid artery calcification, bilateral 04/2016   Cataract    Cervical stenosis of spine    Chronic insomnia    Complication of anesthesia    shoulder surgery- not all the way asleep   COVID-19 02/04/2021   also 10/3 or 04/20/21 sxs started  04/05/21   Depression    in past   Diverticulitis    Diverticulosis    Dizziness    Elevated liver enzymes    Family history of adverse reaction to anesthesia    sister- has problems with nausea and vomiting    Fatigue    Fatty liver    Generalized headache    migraines   GERD (gastroesophageal reflux disease)    History of chemotherapy    topical cream for h/o skin CA   History of kidney stones    History of neuropathy    FH Dr. Posey Pronto neurology   History of vertigo    Hx of migraines    Hyperlipidemia    Hypothyroidism    Insomnia    Irritable bowel syndrome    LLQ abdominal pain    Melanoma ([place]) 2013   Melanoma ([place])    x2 stg II melanoma   Neuropathy, peripheral    Peripheral neuropathy    Pneumonia    hx of x 2   PONV (postoperative nausea and vomiting)    Pre-diabetes    Retina disorder    left   SCC (squamous cell carcinoma)    skin    Stroke ([place])    ?  TIA per Dr V per patient    Thyroid disease    Urine incontinence    Vitamin D deficiency    Weight gain    Past Surgical History:  Procedure Laterality Date   ABLATION Bilateral    Dr. Lucky Cowboy 12/2020 veins laser ablation of both great saphenous veins in a staged fashion.   BILATERAL SALPINGOOPHORECTOMY     ovarian cyst Dr. Ouida Sills and Dr. Alisia Ferrari 2008   BREAST SURGERY     in 2014 bx breast calcifications   BREAST SURGERY     augmentation with saline Dr. Hubbard Hartshorn 1995    CHOLECYSTECTOMY N/A 01/28/2015   Procedure: LAPAROSCOPIC CHOLECYSTECTOMY WITH INTRAOPERATIVE CHOLANGIOGRAM;  Surgeon: Autumn Messing III, MD;  Location: [facility];  Service: General;  Laterality: N/A;   CLOSED MANIPULATION SHOULDER     rt   COLONOSCOPY WITH PROPOFOL N/A 11/20/2012   Procedure: COLONOSCOPY WITH PROPOFOL;  Surgeon: Garlan Fair, MD;  Location: [facility];  Service: Endoscopy;  Laterality: N/A;   colonscopy     May 2014   JOINT REPLACEMENT     RTK   KNEE ARTHROSCOPY     left knee miniscus tear repair     MELANOMA  EXCISION WITH SENTINEL LYMPH NODE BIOPSY  03/18/2011   Back, nodes neg   OOPHORECTOMY     ROTATOR CUFF REPAIR     left   SHOULDER SURGERY     right and left    SKIN CANCER EXCISION     multiple   TOTAL HIP ARTHROPLASTY     TOTAL KNEE ARTHROPLASTY     right 2010 and left in 2018    Wann Left 05/16/2017   Procedure: LEFT TOTAL KNEE ARTHROPLASTY;  Surgeon: Paralee Cancel, MD;  Location: [facility];  Service: Orthopedics;  Laterality: Left;  70 mins   TUBAL LIGATION     TUMOR REMOVAL     ovary   Family History  Problem Relation Age of Onset   Hypertension Mother    Stroke Mother    Hyperlipidemia Mother    Heart disease Father    Diabetes Father    Hypertension Father    Hyperlipidemia Father    Early death Father    Diabetes Sister    Heart disease Sister        heart murmur   Arthritis Sister    COPD Sister    Hyperlipidemia Sister    Hypertension Sister    Colon polyps Sister        adenomous   Arthritis Sister    Heart disease Sister    Hyperlipidemia Sister    Hypertension Sister    Cancer Sister    Stroke Maternal Grandmother    Colon cancer Maternal Grandfather    Cancer Maternal Grandfather    Early death Maternal Grandfather    Early death Paternal Grandmother    Stroke Paternal Grandmother    Heart disease Daughter    Hyperlipidemia Daughter    Hypertension Daughter    Heart disease Son    Hyperlipidemia Son    Alcohol abuse Son    Depression Son    Diabetes Son    Pancreatic cancer Maternal Uncle    Alzheimer's disease Paternal Aunt    Alzheimer's disease Paternal Aunt    Alzheimer's disease Paternal Aunt    Alzheimer's disease Paternal Aunt    Cancer Other        2 paternal uncles and 1 maternal uncle colon cancer   Esophageal cancer  Neg Hx    Stomach cancer Neg Hx    Social History   Socioeconomic History   Marital status: Divorced    Spouse name: Not on file   Number of children: 3   Years of education: College   Highest  education level: Some college, no degree  Occupational History   Occupation: Retired    Fish farm manager: RETIRED  Tobacco Use   Smoking status: Never   Smokeless tobacco: Never  Vaping Use   Vaping Use: Never used  Substance and Sexual Activity   Alcohol use: Yes    Alcohol/week: 1.0 standard drink of alcohol    Types: 1 Cans of beer per week    Comment: once a week beer or wine   Drug use: No   Sexual activity: Not Currently  Other Topics Concern   Not on file  Social History Narrative   Patient lives at home alone in a one story home.   Has 3 children (2 daughters and 1 son)    Retired from [employer]   Divorced since 1992    Caffeine Use: 1 cup daily and a soft drink occasionally    No guns    Wears seat belt     Lives with cat    Left handed   Social Determinants of Health   Financial Resource Strain: Low Risk  (11/10/2021)   Overall Financial Resource Strain (CARDIA)    Difficulty of Paying Living Expenses: Not hard at all  Food Insecurity: No Food Insecurity (11/10/2021)   Hunger Vital Sign    Worried About Running Out of Food in the Last Year: Never true    Ran Out of Food in the Last Year: Never true  Transportation Needs: No Transportation Needs (11/10/2021)   PRAPARE - Hydrologist (Medical): No    Lack of Transportation (Non-Medical): No  Physical Activity: Sufficiently Active (11/10/2021)   Exercise Vital Sign    Days of Exercise per Week: 6 days    Minutes of Exercise per Session: 60 min  Stress: No Stress Concern Present (11/10/2021)   [place]    Feeling of Stress : Only a little  Social Connections: Unknown (11/10/2021)   Social Connection and Isolation Panel [NHANES]    Frequency of Communication with Friends and Family: More than three times a week    Frequency of Social Gatherings with Friends and Family: Not on file    Attends Religious Services:  More than 4 times per year    Active Member of [employer] or Organizations: Yes    Attends Archivist Meetings: Not on file    Marital Status: Not on file  Intimate Partner Violence: Not At Risk (11/10/2021)   Humiliation, Afraid, Rape, and Kick questionnaire    Fear of Current or Ex-Partner: No    Emotionally Abused: No    Physically Abused: No    Sexually Abused: No   Current Meds  Medication Sig   albuterol (PROAIR HFA) 108 (90 Base) MCG/ACT inhaler Inhale 1-2 puffs into the lungs every 6 (six) hours as needed for wheezing or shortness of breath.   aspirin 81 MG tablet TAKE 1 TABLET BY MOUTH EVERY DAY   Calcium 600-200 MG-UNIT tablet Take 1 tablet daily by mouth.   Cholecalciferol (VITAMIN D3) 2000 UNITS capsule Take 2,000 Units by mouth daily.   colestipol (COLESTID) 1 g tablet Take 1 g by mouth 2 (two) times daily.  doxycycline (VIBRA-TABS) 100 MG tablet Take 1 tablet (100 mg total) by mouth 2 (two) times daily. With food x 7-10 days with food   FOLIC ACID PO Take by mouth daily.   loperamide (IMODIUM A-D) 2 MG tablet Take 1 tablet (2 mg total) by mouth as needed for diarrhea or loose stools.   neomycin-polymyxin-hydrocortisone (CORTISPORIN) OTIC solution Place 4 drops into the right ear 3 (three) times daily. Right ear pain   olmesartan (BENICAR) 20 MG tablet Take 0.5 tablets (10 mg total) by mouth in the morning and at bedtime. D/c 5 mg (10 mg bid of 5 mg)   Omega-3 Fatty Acids (FISH OIL) 1000 MG CAPS Take 1,000 mg by mouth daily.    triamcinolone cream (KENALOG) 0.1 % Apply 1 Application topically 2 (two) times daily. Prn legs   venlafaxine XR (EFFEXOR XR) 37.5 MG 24 hr capsule Take 1 capsule (37.5 mg total) by mouth daily with breakfast. D/c zoloft   vitamin B-12 (CYANOCOBALAMIN) 1000 MCG tablet Take 2,000 mcg by mouth daily. 2 tabs   Zoster Vaccine Adjuvanted [facility]) injection Inject 0.5 mLs into the muscle once for 1 dose.   [DISCONTINUED] atorvastatin (LIPITOR) 20 MG  tablet Take 1 tablet (20 mg total) by mouth daily.   [DISCONTINUED] levothyroxine (SYNTHROID) 75 MCG tablet Take 1 tablet (75 mcg total) by mouth daily before breakfast.   [DISCONTINUED] olmesartan (BENICAR) 5 MG tablet TAKE 2 TABLETS BY MOUTH IN THE MORNING AND 2 AT BEDTIME   [DISCONTINUED] omeprazole (PRILOSEC) 20 MG capsule Take 1 capsule (20 mg total) by mouth 2 (two) times daily before a meal.   [DISCONTINUED] sertraline (ZOLOFT) 25 MG tablet Take 1 tablet (25 mg total) by mouth daily.   Allergies  Allergen Reactions   Epinephrine Other (See Comments) and Anaphylaxis    Heart palpitations   Penicillins Swelling and Other (See Comments)    Lip swelling Has patient had a PCN reaction causing immediate rash, facial/tongue/throat swelling, SOB or lightheadedness with hypotension: Yes Has patient had a PCN reaction causing severe rash involving mucus membranes or skin necrosis: No Has patient had a PCN reaction that required hospitalization No Has patient had a PCN reaction occurring within the last 10 years: No If all of the above answers are "NO", then may proceed with Cephalosporin use.   Lidocaine Hcl Other (See Comments)   Other     Black pepper- caused bronchial tubes to start closing    Valium [Diazepam] Other (See Comments)    Other reaction(s): Other (See Comments) Overly sensitive  sedation   No results found for this or any previous visit (from the past 2160 hour(s)). Objective  Body mass index is 31.44 kg/m. Wt Readings from Last 3 Encounters:  01/12/22 194 lb 12.8 oz (88.4 kg)  12/16/21 192 lb 9.6 oz (87.4 kg)  11/10/21 186 lb (84.4 kg)   Temp Readings from Last 3 Encounters:  01/12/22 98.2 F (36.8 C) (Oral)  12/16/21 97.7 F (36.5 C) (Temporal)  09/07/21 98.3 F (36.8 C) (Oral)   BP Readings from Last 3 Encounters:  01/12/22 120/70  12/16/21 110/78  09/07/21 122/76   Pulse Readings from Last 3 Encounters:  01/12/22 64  12/16/21 69  09/07/21 71     Physical Exam Vitals and nursing note reviewed.  Constitutional:      Appearance: Normal appearance. She is well-developed and well-groomed.  HENT:     Head: Normocephalic and atraumatic.  Eyes:     Conjunctiva/sclera: Conjunctivae normal.  Pupils: Pupils are equal, round, and reactive to light.  Cardiovascular:     Rate and Rhythm: Normal rate and regular rhythm.     Heart sounds: Normal heart sounds. No murmur heard. Pulmonary:     Effort: Pulmonary effort is normal.     Breath sounds: Normal breath sounds.  Abdominal:     General: Abdomen is flat. Bowel sounds are normal.     Tenderness: There is no abdominal tenderness.  Musculoskeletal:        General: No tenderness.  Skin:    General: Skin is warm and dry.  Neurological:     General: No focal deficit present.     Mental Status: She is alert and oriented to person, place, and time. Mental status is at baseline.     Cranial Nerves: Cranial nerves 2-12 are intact.     Motor: Motor function is intact.     Coordination: Coordination is intact.     Gait: Gait is intact.     Comments: Walking with cane  Psychiatric:        Attention and Perception: Attention and perception normal.        Mood and Affect: Mood and affect normal.        Speech: Speech normal.        Behavior: Behavior normal. Behavior is cooperative.        Thought Content: Thought content normal.        Cognition and Memory: Cognition and memory normal.        Judgment: Judgment normal.     Assessment  Plan  Venous reflux with chronic lymphedema Venous stasis dermatitis of both lower extremities - Plan: triamcinolone cream (KENALOG) 0.1 % bid prn Cellulitis of lower extremity, unspecified laterality - Plan: doxycycline (VIBRA-TABS) 100 MG tablet bid x 7-10 days   Neck mass - Plan: US Soft Tissue Head/Neck (NON-THYROID)   Hyperlipidemia, unspecified hyperlipidemia type - Plan: atorvastatin (LIPITOR) 20 MG tablet  Essential  hypertension Controlled benicar 10 mg bid   Hypothyroidism, unspecified type - Plan: levothyroxine (SYNTHROID) 75 MCG tablet  Anxiety and depression - Plan: venlafaxine XR (EFFEXOR XR) 37.5 MG 24 hr capsule Taper off zoloft 25 mg qd   HM Flu shot consider 2022  Tdap -had TD 10/18/12 not pertussis will need this in future  shingrix rx  today zostervax had 06/16/11  prevnar had 05/15/14  covid 4/4 vaccines utd pfizer covid + 02/04/21 and 04/05/21-04/20/21  Pna 23 had 12/16/05, 05/18/16    Pap out of age window LMP 60s both ovaries and Fts removed 2008 -referred 05/05/21 [facility] female pelvic pain h/o genital warts Seen 08/2021 Dr.Megan Rayland with implants 01/21/20 normal  -10/18/16 screening with implants neg FH breast cancer  p cousins dx'ed in 65s x 3, sister  Had at age 50 y.o and colon cancer age 89, 2 paternal cousins ovarian cancer age 57 and 12  01/21/20 negative ordered [facility] for 01/20/21 and scheduled and neg  Ordered 01/2022    Colonoscopy 11/2012 tortuous colon no further rec.  -she is established and f/u GI Dr. Farrel Gordon [place] virtual coloscopy had 02/25/20 -leb  02/25/20 CT colonoscopy-[facility] GI   IMPRESSION: 1. Suboptimal evaluation of the sigmoid, secondary to underdistention. No circumferential mass within this region. More proximally, no evidence of clinically significant colonic polyp or mass. 2. Small hiatal hernia. 3. Aortic Atherosclerosis (ICD10-I70.0).     DEXA Sadie Haber PCP 06/28/16 normal reviewed report did not scan into chart -  h/o vitamin D def on D3 2000 IU daily     [facility]at[facility]" [facility] Dr. Delman Cheadle mohs surgery  F/u from 04/10/19 left lower leg cellulitis for SCC changed to levaquin rec compression socks and acidic acid soaks qhs   05/15/19 f/u appt s/p SCC mohs removal f/u in 3 months Dr. Winifred Olive [facility]  F/u with Dr. Delman Cheadle derm and [facility] mohs as scheduled freq f/us   Echo 01/07/20  FINDINGS   Left Ventricle: Left  ventricular ejection fraction, by estimation, is 60  to 65%. The left ventricle has normal function. The left ventricle has no  regional wall motion abnormalities. The left ventricular internal cavity  size was normal in size. There is   borderline left ventricular hypertrophy. Left ventricular diastolic  parameters are consistent with age-related delayed relaxation (normal).   Right Ventricle: The right ventricular size is normal. No increase in  right ventricular wall thickness. Right ventricular systolic function is  normal. There is normal pulmonary artery systolic pressure. The tricuspid  regurgitant velocity is 2.46 m/s, and   with an assumed right atrial pressure of 3 mmHg, the estimated right  ventricular systolic pressure is 32.9 mmHg.   Left Atrium: Left atrial size was normal in size.   Right Atrium: Right atrial size was normal in size.   Pericardium: There is no evidence of pericardial effusion.   Mitral Valve: The mitral valve is degenerative in appearance. There is  mild thickening of the mitral valve leaflet(s). Mild mitral valve  regurgitation. No evidence of mitral valve stenosis. MV peak gradient, 3.7  mmHg. The mean mitral valve gradient is  1.0 mmHg.   Tricuspid Valve: The tricuspid valve is normal in structure. Tricuspid  valve regurgitation is mild to moderate.   Aortic Valve: The aortic valve is tricuspid. Aortic valve regurgitation is  not visualized. No aortic stenosis is present. Aortic valve mean gradient  measures 4.0 mmHg. Aortic valve peak gradient measures 9.1 mmHg. Aortic  valve area, by VTI measures 2.19  cm.   Pulmonic Valve: The pulmonic valve was not well visualized. Pulmonic valve  regurgitation is mild.   Aorta: The aortic root is normal in size and structure.   Pulmonary Artery: The pulmonary artery is not well seen.   Venous: The inferior vena cava is normal in size with greater than 50%  respiratory variability, suggesting right  atrial pressure of 3 mmHg.   IAS/Shunts: The interatrial septum was not well visualized.   Specialists  [facility] saw 01/01/19 increased nocturia and urgency pending Botox to tx and UDS Dr. Junious Silk and try myrbetriq before UDS    Cards-Dr. Rockey Situ leb GI Leb GI Dr. Moss Mc MD Dr. Cordelia Pen [facility]-retinal specialist Dentist Dr. Lynelle Smoke Neurology-[facility]eb Neurology Dr. Posey Pronto  ENT-Dr. Tami Ribas for vestibular balance issue s Podiatry Dr. Milinda Pointer H/o Dr. Earlie Server [facility] Lung-Dr. Patsey Berthold , Leb in [place] Ortho Dr. Alvan Dame Surgery Dr.Toth Ob/gyn-[facility] for [facility]   Dermatology Dr. Delman Cheadle in [place] h/o [facility] left leg and MM right leg, neck and back with node dissection Dr. Marlou Starks 8/12    Former PCP Dr. Jossie Ng -pt brought in all records week of 09/21/2018 and reviewed -h/o iron def anemia -last seen 07/09/18 and 08/09/18 possible thrush and URI   US carotid 05/13/16 <50% stenosis in right and left ICA -she was at this time put on lipitor 10 mg qhs   CT ab/pelvis 07/26/17 left base scarring lung, well circumcised liver lesion likely cysts and stable hepatomegaly  19.4 cm cranlocaudal pancreas/spleen normal, extensive colonic diverticulosis, aortic and branch vessell atherosclerosis no adenopathy, normal uterus and adnexa, advanced LS spondylosis grade 1 L4/5 and L5/S1 anterolisthesis are similar, pelvic floor laxity, hiatal hernia CXR 02/26/18 thoracic aorta mildly atherosclerotic unchanged, Deg changes T spine, Deg changes b/l shoulders prior right shoulder surgery calcification distal left supraspinatus tendon at its insertion on the great tuberosity of the left humerus    Provider: Dr. Olivia Mackie McLean-Scocuzza-Internal Medicine

## 2022-01-12 NOTE — Telephone Encounter (Signed)
Lft pt vm to call ofc to sch US. Thank you! 

## 2022-01-20 DIAGNOSIS — L539 Erythematous condition, unspecified: Secondary | ICD-10-CM | POA: Diagnosis not present

## 2022-01-22 DIAGNOSIS — R5383 Other fatigue: Secondary | ICD-10-CM | POA: Diagnosis not present

## 2022-01-22 DIAGNOSIS — R238 Other skin changes: Secondary | ICD-10-CM | POA: Diagnosis not present

## 2022-01-25 DIAGNOSIS — Z08 Encounter for follow-up examination after completed treatment for malignant neoplasm: Secondary | ICD-10-CM | POA: Diagnosis not present

## 2022-01-25 DIAGNOSIS — Z85828 Personal history of other malignant neoplasm of skin: Secondary | ICD-10-CM | POA: Diagnosis not present

## 2022-01-25 DIAGNOSIS — D485 Neoplasm of uncertain behavior of skin: Secondary | ICD-10-CM | POA: Diagnosis not present

## 2022-01-27 ENCOUNTER — Ambulatory Visit: Payer: PPO

## 2022-01-27 DIAGNOSIS — Z20822 Contact with and (suspected) exposure to covid-19: Secondary | ICD-10-CM | POA: Diagnosis not present

## 2022-01-27 DIAGNOSIS — J069 Acute upper respiratory infection, unspecified: Secondary | ICD-10-CM | POA: Diagnosis not present

## 2022-01-28 DIAGNOSIS — D485 Neoplasm of uncertain behavior of skin: Secondary | ICD-10-CM | POA: Diagnosis not present

## 2022-01-28 DIAGNOSIS — L570 Actinic keratosis: Secondary | ICD-10-CM | POA: Diagnosis not present

## 2022-01-28 DIAGNOSIS — L578 Other skin changes due to chronic exposure to nonionizing radiation: Secondary | ICD-10-CM | POA: Diagnosis not present

## 2022-01-28 DIAGNOSIS — C44629 Squamous cell carcinoma of skin of left upper limb, including shoulder: Secondary | ICD-10-CM | POA: Diagnosis not present

## 2022-01-31 ENCOUNTER — Telehealth: Payer: Self-pay | Admitting: Internal Medicine

## 2022-01-31 NOTE — Telephone Encounter (Signed)
Pt called in requesting for lab orders... Pt stated that she has no energy.... No lab orders in system... Pt requesting callback.Marland KitchenMarland KitchenMarland Kitchen

## 2022-02-08 ENCOUNTER — Ambulatory Visit
Admission: RE | Admit: 2022-02-08 | Discharge: 2022-02-08 | Disposition: A | Discharge status: Home / Self Care | Payer: PPO | Source: Ambulatory Visit | Attending: Internal Medicine | Admitting: Internal Medicine

## 2022-02-08 DIAGNOSIS — R221 Localized swelling, mass and lump, neck: Secondary | ICD-10-CM | POA: Insufficient documentation

## 2022-02-11 ENCOUNTER — Telehealth: Payer: Self-pay | Admitting: Internal Medicine

## 2022-02-11 NOTE — Telephone Encounter (Signed)
[name] from [facility] called, [phone] and Fax # [phone]. [name] has fax multiple times requesting a order update, diagnostic w/ultra sound. Do to breast pain. Patient has appointment on 02/14/2022 at 10 am. Please fax new order before appt.

## 2022-02-11 NOTE — Telephone Encounter (Signed)
We have faxed 1x fax again please

## 2022-02-14 DIAGNOSIS — N644 Mastodynia: Secondary | ICD-10-CM | POA: Diagnosis not present

## 2022-02-14 LAB — HM MAMMOGRAPHY

## 2022-02-16 ENCOUNTER — Encounter: Payer: Self-pay | Admitting: Internal Medicine

## 2022-02-17 ENCOUNTER — Encounter: Payer: Self-pay | Admitting: Internal Medicine

## 2022-02-17 ENCOUNTER — Telehealth: Payer: Self-pay | Admitting: Internal Medicine

## 2022-02-17 NOTE — Telephone Encounter (Signed)
Pt called in stating she was working outside and had soft shoes on and did not know how bad it was rubbing on her feet. Pt stated on her right foot she has a callus that is not smooth but sore. Pt would like to be called

## 2022-02-17 NOTE — Telephone Encounter (Signed)
I rec see podiatry she is established with Dr. Milinda Pointer or can see Dr. Guy Sandifer, Dr. Posey Pronto, Dr. Amalia Hailey Call for appt  Phone Fax E-mail Address  [phone] Not available [address]   [place] [address]

## 2022-02-17 NOTE — Telephone Encounter (Signed)
Left information on machine told pt she can cb if she had questions but I told her the recommended step would be to reach out to podiatry.

## 2022-02-20 DIAGNOSIS — M19071 Primary osteoarthritis, right ankle and foot: Secondary | ICD-10-CM | POA: Diagnosis not present

## 2022-02-20 DIAGNOSIS — Z0389 Encounter for observation for other suspected diseases and conditions ruled out: Secondary | ICD-10-CM | POA: Diagnosis not present

## 2022-02-20 DIAGNOSIS — M79671 Pain in right foot: Secondary | ICD-10-CM | POA: Diagnosis not present

## 2022-02-21 DIAGNOSIS — C44629 Squamous cell carcinoma of skin of left upper limb, including shoulder: Secondary | ICD-10-CM | POA: Diagnosis not present

## 2022-02-21 DIAGNOSIS — L905 Scar conditions and fibrosis of skin: Secondary | ICD-10-CM | POA: Diagnosis not present

## 2022-02-24 ENCOUNTER — Ambulatory Visit: Payer: PPO | Admitting: Podiatry

## 2022-03-07 DIAGNOSIS — C44629 Squamous cell carcinoma of skin of left upper limb, including shoulder: Secondary | ICD-10-CM | POA: Diagnosis not present

## 2022-03-31 ENCOUNTER — Ambulatory Visit (INDEPENDENT_AMBULATORY_CARE_PROVIDER_SITE_OTHER): Payer: PPO | Admitting: Internal Medicine

## 2022-03-31 ENCOUNTER — Encounter: Payer: Self-pay | Admitting: Internal Medicine

## 2022-03-31 VITALS — BP 120/60 | HR 70 | Temp 98.4°F | Ht 66.0 in | Wt 190.2 lb

## 2022-03-31 DIAGNOSIS — R5383 Other fatigue: Secondary | ICD-10-CM

## 2022-03-31 DIAGNOSIS — N3000 Acute cystitis without hematuria: Secondary | ICD-10-CM

## 2022-03-31 DIAGNOSIS — M7989 Other specified soft tissue disorders: Secondary | ICD-10-CM | POA: Diagnosis not present

## 2022-03-31 DIAGNOSIS — E559 Vitamin D deficiency, unspecified: Secondary | ICD-10-CM | POA: Diagnosis not present

## 2022-03-31 DIAGNOSIS — F339 Major depressive disorder, recurrent, unspecified: Secondary | ICD-10-CM

## 2022-03-31 DIAGNOSIS — R7303 Prediabetes: Secondary | ICD-10-CM | POA: Diagnosis not present

## 2022-03-31 DIAGNOSIS — R251 Tremor, unspecified: Secondary | ICD-10-CM

## 2022-03-31 MED ORDER — CITALOPRAM HYDROBROMIDE 20 MG PO TABS: 20.0000 mg | ORAL_TABLET | Freq: Every day | ORAL | 3 refills | Status: DC

## 2022-03-31 NOTE — Patient Instructions (Addendum)
Take venlafaxine every of day x 1 week then stop it (after 4 doses) sent celexa to start after you stop venlafaxine   [place] 4.7 6 [employer] [facility] in [place], [place] Address: [address], [place], [place] [address] Hours:  Open now    Add full hours    Phone: [phone]   Tremor A tremor is trembling or shaking that you cannot control. Most tremors affect the hands or arms. Tremors can also affect the head, vocal cords, face, and other parts of the body. There are many types of tremors. Common types include: Essential tremor. These usually occur in people older than 40. This type of tremor may run in families and can happen in otherwise healthy people. Resting tremor. These occur when the muscles are at rest, such as when your hands are resting in your lap. People with Parkinson's disease often have resting tremors. Postural tremor. These occur when you try to hold a pose, such as keeping your hands outstretched. Kinetic tremor. These occur during purposeful movement, such as trying to touch a finger to your nose. Task-specific tremor. These may occur when you do certain tasks such as writing, speaking, or standing. Psychogenic tremor. These are greatly reduced or go away when you are distracted. These tremors happen due to underlying stress or psychiatric disease. They can happen in people of all ages. Some types of tremors have no known cause. Tremors can also be a symptom of nervous system problems (neurological disorders) that may occur with aging. Some tremors go away with treatment, while others do not. Follow these instructions at home: Lifestyle     If you drink alcohol: Limit how much you have to: 0-1 drink a day for women who are not pregnant. 0-2 drinks a day for men. Know how much alcohol is in a drink. In the [place] one drink equals one 12 oz bottle of beer (355 mL), one 5 oz  glass of wine (148 mL), or one 1 oz glass of hard liquor (44 mL). Do not use any products that contain nicotine or tobacco. These products include cigarettes, chewing tobacco, and vaping devices, such as e-cigarettes. If you need help quitting, ask your health care provider. Avoid extreme heat and extreme cold. Limit your caffeine intake, as told by your health care provider. Try to get 8 hours of sleep each night. Find ways to manage your stress, such as meditation or yoga. General instructions Take over-the-counter and prescription medicines only as told by your health care provider. Keep all follow-up visits. This is important. Contact a health care provider if: You develop a tremor after starting a new medicine. You have a tremor along with other symptoms such as: Numbness. Tingling. Pain. Weakness. Your tremor gets worse. Your tremor interferes with your day-to-day life. Summary A tremor is trembling or shaking that you cannot control. Most tremors affect the hands or arms. Some types of tremors have no known cause. Others may be a symptom of nervous system problems (neurological disorders). Make sure you discuss any tremors you have with your health care provider. This information is not intended to replace advice given to you by your health care provider. Make sure you discuss any questions you have with your health care provider. Document Revised: 04/23/2021 Document Reviewed: 04/23/2021 Elsevier Patient Education  [employer].

## 2022-03-31 NOTE — Progress Notes (Signed)
Chief Complaint  Patient presents with   Fatigue    Feeling fatigue weak and shaky   F/u  1. C/o tremor left hand > right hand her 81 y.o sister has dementia and tremor otherwise no one else in the family reviewed effexor 37.5 can cause tremor and will stop for now  She has also had falls, gait changes walks with cane now and her friend recently dx'ed with NPH and she is cw this but reviewed MRI 02/24/21 h/o stroke but negative NPH  Pt will hold on neurology referral for now but will consider in the future if tremor does not get better   2. Left arm exc of SCC Dr. Berdine Addison in [facility] dermatology specialist [phone] area at the top of scar is puckered and she has had swelling of entire area after surgery w/o pain and swelling has not gone down  3.declines flu shot today 4 fatigue will check labs today     Review of Systems  Constitutional:  Positive for malaise/fatigue. Negative for weight loss.  HENT:  Negative for hearing loss.   Eyes:  Negative for blurred vision.  Respiratory:  Negative for shortness of breath.   Cardiovascular:  Negative for chest pain.  Gastrointestinal:  Negative for abdominal pain and blood in stool.  Genitourinary:  Negative for dysuria.  Musculoskeletal:  Positive for falls. Negative for joint pain.  Skin:  Negative for rash.  Neurological:  Positive for tremors. Negative for headaches.  Psychiatric/Behavioral:  Negative for depression.    Past Medical History:  Diagnosis Date   Acid reflux    Allergy    Anemia    Anxiety    in past   Arthritis    all over   Asthma    Atypical moles    Bronchitis    Bronchospasm    reactive with perfume/cologne and smoke   CAD (coronary artery disease)    patient denies on preop of 10/24    Candida esophagitis (HCC)    Carotid artery calcification, bilateral 04/2016   Cataract    Cervical stenosis of spine    Chronic insomnia    Complication of anesthesia    shoulder surgery- not all the way asleep   COVID-19  02/04/2021   also 10/3 or 04/20/21 sxs started 04/05/21   Depression    in past   Diverticulitis    Diverticulosis    Dizziness    Elevated liver enzymes    Family history of adverse reaction to anesthesia    sister- has problems with nausea and vomiting    Fatigue    Fatty liver    Generalized headache    migraines   GERD (gastroesophageal reflux disease)    History of chemotherapy    topical cream for h/o skin CA   History of kidney stones    History of neuropathy    FH Dr. Posey Pronto neurology   History of vertigo    Hx of migraines    Hyperlipidemia    Hypothyroidism    Insomnia    Irritable bowel syndrome    LLQ abdominal pain    Melanoma ([place]) 2013   Melanoma ([place])    x2 stg II melanoma   Neuropathy, peripheral    Peripheral neuropathy    Pneumonia    hx of x 2   PONV (postoperative nausea and vomiting)    Pre-diabetes    Retina disorder    left   SCC (squamous cell carcinoma)    skin  Stroke [facility])    ? TIA per Dr V per patient    Thyroid disease    Urine incontinence    Vitamin D deficiency    Weight gain    Past Surgical History:  Procedure Laterality Date   ABLATION Bilateral    Dr. Lucky Cowboy 12/2020 veins laser ablation of both great saphenous veins in a staged fashion.   BILATERAL SALPINGOOPHORECTOMY     ovarian cyst Dr. Ouida Sills and Dr. Alisia Ferrari 2008   BREAST SURGERY     in 2014 bx breast calcifications   BREAST SURGERY     augmentation with saline Dr. Hubbard Hartshorn 1995    CHOLECYSTECTOMY N/A 01/28/2015   Procedure: LAPAROSCOPIC CHOLECYSTECTOMY WITH INTRAOPERATIVE CHOLANGIOGRAM;  Surgeon: Autumn Messing III, MD;  Location: [facility];  Service: General;  Laterality: N/A;   CLOSED MANIPULATION SHOULDER     rt   COLONOSCOPY WITH PROPOFOL N/A 11/20/2012   Procedure: COLONOSCOPY WITH PROPOFOL;  Surgeon: Garlan Fair, MD;  Location: [facility];  Service: Endoscopy;  Laterality: N/A;   colonscopy     May 2014   JOINT REPLACEMENT     RTK   KNEE ARTHROSCOPY      left knee miniscus tear repair     MELANOMA EXCISION WITH SENTINEL LYMPH NODE BIOPSY  03/18/2011   Back, nodes neg   OOPHORECTOMY     ROTATOR CUFF REPAIR     left   SHOULDER SURGERY     right and left    SKIN CANCER EXCISION     multiple   TOTAL HIP ARTHROPLASTY     TOTAL KNEE ARTHROPLASTY     right 2010 and left in 2018    Loyal Left 05/16/2017   Procedure: LEFT TOTAL KNEE ARTHROPLASTY;  Surgeon: Paralee Cancel, MD;  Location: [facility];  Service: Orthopedics;  Laterality: Left;  70 mins   TUBAL LIGATION     TUMOR REMOVAL     ovary   Family History  Problem Relation Age of Onset   Hypertension Mother    Stroke Mother    Hyperlipidemia Mother    Heart disease Father    Diabetes Father    Hypertension Father    Hyperlipidemia Father    Early death Father    Diabetes Sister    Heart disease Sister        heart murmur   Arthritis Sister    COPD Sister    Hyperlipidemia Sister    Hypertension Sister    Colon polyps Sister        adenomous   Arthritis Sister    Heart disease Sister    Hyperlipidemia Sister    Hypertension Sister    Cancer Sister    Dementia Sister    Tremor Sister    Stroke Maternal Grandmother    Colon cancer Maternal Grandfather    Cancer Maternal Grandfather    Early death Maternal Grandfather    Early death Paternal Grandmother    Stroke Paternal Grandmother    Heart disease Daughter    Hyperlipidemia Daughter    Hypertension Daughter    Heart disease Son    Hyperlipidemia Son    Alcohol abuse Son    Depression Son    Diabetes Son    Pancreatic cancer Maternal Uncle    Alzheimer's disease Paternal Aunt    Alzheimer's disease Paternal Aunt    Alzheimer's disease Paternal Aunt    Alzheimer's disease Paternal Aunt    Cancer Other  2 paternal uncles and 1 maternal uncle colon cancer   Esophageal cancer Neg Hx    Stomach cancer Neg Hx    Social History   Socioeconomic History   Marital status: Divorced    Spouse  name: Not on file   Number of children: 3   Years of education: College   Highest education level: Some college, no degree  Occupational History   Occupation: Retired    Fish farm manager: RETIRED  Tobacco Use   Smoking status: Never   Smokeless tobacco: Never  Vaping Use   Vaping Use: Never used  Substance and Sexual Activity   Alcohol use: Yes    Alcohol/week: 1.0 standard drink of alcohol    Types: 1 Cans of beer per week    Comment: once a week beer or wine   Drug use: No   Sexual activity: Not Currently  Other Topics Concern   Not on file  Social History Narrative   Patient lives at home alone in a one story home.   Has 3 children (2 daughters and 1 son)    Retired from [employer]   Divorced since 1992    Caffeine Use: 1 cup daily and a soft drink occasionally    No guns    Wears seat belt     Lives with cat    Left handed   Social Determinants of Health   Financial Resource Strain: Low Risk  (11/10/2021)   Overall Financial Resource Strain (CARDIA)    Difficulty of Paying Living Expenses: Not hard at all  Food Insecurity: No Food Insecurity (11/10/2021)   Hunger Vital Sign    Worried About Running Out of Food in the Last Year: Never true    Ran Out of Food in the Last Year: Never true  Transportation Needs: No Transportation Needs (11/10/2021)   PRAPARE - Hydrologist (Medical): No    Lack of Transportation (Non-Medical): No  Physical Activity: Sufficiently Active (11/10/2021)   Exercise Vital Sign    Days of Exercise per Week: 6 days    Minutes of Exercise per Session: 60 min  Stress: No Stress Concern Present (11/10/2021)   So-Hi    Feeling of Stress : Only a little  Social Connections: Unknown (11/10/2021)   Social Connection and Isolation Panel [NHANES]    Frequency of Communication with Friends and Family: More than three times a week    Frequency of  Social Gatherings with Friends and Family: Not on file    Attends Religious Services: More than 4 times per year    Active Member of [employer] or Organizations: Yes    Attends Archivist Meetings: Not on file    Marital Status: Not on file  Intimate Partner Violence: Not At Risk (11/10/2021)   Humiliation, Afraid, Rape, and Kick questionnaire    Fear of Current or Ex-Partner: No    Emotionally Abused: No    Physically Abused: No    Sexually Abused: No   Current Meds  Medication Sig   albuterol (PROAIR HFA) 108 (90 Base) MCG/ACT inhaler Inhale 1-2 puffs into the lungs every 6 (six) hours as needed for wheezing or shortness of breath.   aspirin 81 MG tablet TAKE 1 TABLET BY MOUTH EVERY DAY   atorvastatin (LIPITOR) 20 MG tablet Take 1 tablet (20 mg total) by mouth daily.   Calcium 600-200 MG-UNIT tablet Take 1 tablet daily by mouth.  Cholecalciferol (VITAMIN D3) 2000 UNITS capsule Take 2,000 Units by mouth daily.   citalopram (CELEXA) 20 MG tablet Take 1 tablet (20 mg total) by mouth daily.   colestipol (COLESTID) 1 g tablet Take 1 g by mouth 2 (two) times daily.   doxycycline (VIBRA-TABS) 100 MG tablet Take 1 tablet (100 mg total) by mouth 2 (two) times daily. With food x 7-10 days with food   FOLIC ACID PO Take by mouth daily.   levothyroxine (SYNTHROID) 75 MCG tablet Take 1 tablet (75 mcg total) by mouth daily before breakfast.   loperamide (IMODIUM A-D) 2 MG tablet Take 1 tablet (2 mg total) by mouth as needed for diarrhea or loose stools.   neomycin-polymyxin-hydrocortisone (CORTISPORIN) OTIC solution Place 4 drops into the right ear 3 (three) times daily. Right ear pain   olmesartan (BENICAR) 20 MG tablet Take 0.5 tablets (10 mg total) by mouth in the morning and at bedtime. D/c 5 mg (10 mg bid of 5 mg)   Omega-3 Fatty Acids (FISH OIL) 1000 MG CAPS Take 1,000 mg by mouth daily.    omeprazole (PRILOSEC) 20 MG capsule Take 1 capsule (20 mg total) by mouth 2 (two) times daily before  a meal.   triamcinolone cream (KENALOG) 0.1 % Apply 1 Application topically 2 (two) times daily. Prn legs   vitamin B-12 (CYANOCOBALAMIN) 1000 MCG tablet Take 2,000 mcg by mouth daily. 2 tabs   [DISCONTINUED] venlafaxine XR (EFFEXOR XR) 37.5 MG 24 hr capsule Take 1 capsule (37.5 mg total) by mouth daily with breakfast. D/c zoloft   Allergies  Allergen Reactions   Epinephrine Other (See Comments) and Anaphylaxis    Heart palpitations   Penicillins Swelling and Other (See Comments)    Lip swelling Has patient had a PCN reaction causing immediate rash, facial/tongue/throat swelling, SOB or lightheadedness with hypotension: Yes Has patient had a PCN reaction causing severe rash involving mucus membranes or skin necrosis: No Has patient had a PCN reaction that required hospitalization No Has patient had a PCN reaction occurring within the last 10 years: No If all of the above answers are "NO", then may proceed with Cephalosporin use.   Lidocaine Hcl Other (See Comments)   Other     Black pepper- caused bronchial tubes to start closing    Valium [Diazepam] Other (See Comments)    Other reaction(s): Other (See Comments) Overly sensitive  sedation   Recent Results (from the past 2160 hour(s))  HM MAMMOGRAPHY     Status: None   Collection Time: 02/14/22 12:00 AM  Result Value Ref Range   HM Mammogram 0-4 Bi-Rad 0-4 Bi-Rad, Self Reported Normal    Comment: neg [facility]   Objective  Body mass index is 30.7 kg/m. Wt Readings from Last 3 Encounters:  03/31/22 190 lb 3.2 oz (86.3 kg)  01/12/22 194 lb 12.8 oz (88.4 kg)  12/16/21 192 lb 9.6 oz (87.4 kg)   Temp Readings from Last 3 Encounters:  03/31/22 98.4 F (36.9 C)  01/12/22 98.2 F (36.8 C) (Oral)  12/16/21 97.7 F (36.5 C) (Temporal)   BP Readings from Last 3 Encounters:  03/31/22 120/60  01/12/22 120/70  12/16/21 110/78   Pulse Readings from Last 3 Encounters:  03/31/22 70  01/12/22 64  12/16/21 69    Physical  Exam Vitals and nursing note reviewed.  Constitutional:      Appearance: Normal appearance. She is well-developed and well-groomed.  HENT:     Head: Normocephalic and atraumatic.  Eyes:  Conjunctiva/sclera: Conjunctivae normal.     Pupils: Pupils are equal, round, and reactive to light.  Cardiovascular:     Rate and Rhythm: Normal rate and regular rhythm.     Heart sounds: Normal heart sounds. No murmur heard. Pulmonary:     Effort: Pulmonary effort is normal.     Breath sounds: Normal breath sounds.  Abdominal:     General: Abdomen is flat. Bowel sounds are normal.     Tenderness: There is no abdominal tenderness.  Musculoskeletal:        General: No tenderness.  Skin:    General: Skin is warm and dry.  Neurological:     General: No focal deficit present.     Mental Status: She is alert and oriented to person, place, and time. Mental status is at baseline.     Cranial Nerves: Cranial nerves 2-12 are intact.     Motor: Motor function is intact.     Coordination: Coordination is intact.     Gait: Gait is intact.  Psychiatric:        Attention and Perception: Attention and perception normal.        Mood and Affect: Mood and affect normal.        Speech: Speech normal.        Behavior: Behavior normal. Behavior is cooperative.        Thought Content: Thought content normal.        Cognition and Memory: Cognition and memory normal.        Judgment: Judgment normal.     Assessment  Plan  Tremor of both hands MRI 02/24/21 no reason for tremor could be essential tremor wean off effexor as this could be causing tremor as well  Consider neurology referral in the future Dr Thresa Ross if not better  Left arm swelling  s/p exc SCC > 1 month ago ro hematoma vs seroma- Plan: US SOFT TISSUE UPPER EXTREMITY LIMITED LEFT (NON-VASCULAR) CC Dr. Berdine Addison [facility] in [place] [phone]  Acute cystitis without hematuria - Plan: Urinalysis, Routine w reflex microscopic, Urine  Culture  Prediabetes - Plan: Hemoglobin A1c  Depression, recurrent ([place]) - Plan: citalopram (CELEXA) 20 MG tablet  Dc effexor 37.5 due to c/w tremor as side effect  HM Flu shot consider 2022 declines today Tdap -had TD 10/18/12 not pertussis will need this in future  shingrix rx previously zostervax had 06/16/11  prevnar had 05/15/14  covid 4/4 vaccines utd pfizer covid + 02/04/21 and 04/05/21-04/20/21  Pna 23 had 12/16/05, 05/18/16    Pap out of age window LMP 58s both ovaries and Fts removed 2008 -referred 05/05/21 [facility] female pelvic pain h/o genital warts Seen 08/2021 Dr.Megan [place] with implants 01/21/20 normal  -10/18/16 screening with implants neg FH breast cancer  p cousins dx'ed in 21s x 3, sister  Had at age 33 y.o and colon cancer age 21, 2 paternal cousins ovarian cancer age 60 and 53  01/21/20 negative ordered [facility] for 01/20/21 and scheduled and neg  02/14/22 negative    Colonoscopy 11/2012 tortuous colon no further rec.  -she is established and f/u GI Dr. Farrel Gordon [place] virtual coloscopy had 02/25/20 -leb  02/25/20 CT colonoscopy-leb GI   IMPRESSION: 1. Suboptimal evaluation of the sigmoid, secondary to underdistention. No circumferential mass within this region. More proximally, no evidence of clinically significant colonic polyp or mass. 2. Small hiatal hernia. 3. Aortic Atherosclerosis (ICD10-I70.0).     DEXA Sadie Haber PCP  06/28/16 normal reviewed report did not scan into chart -h/o vitamin D def on D3 2000 IU daily     [facility]ermatology" [facility] Dr. Delman Cheadle mohs surgery  F/u from 04/10/19 left lower leg cellulitis for SCC changed to levaquin rec compression socks and acidic acid soaks qhs   05/15/19 f/u appt s/p SCC mohs removal f/u in 3 months Dr. Win[facility]  F/u with Dr. Delman Cheadle derm and [facility] mohs as scheduled freq f/us   Echo 01/07/20  FINDINGS   Left Ventricle: Left ventricular ejection fraction, by estimation, is 60  to 65%.  The left ventricle has normal function. The left ventricle has no  regional wall motion abnormalities. The left ventricular internal cavity  size was normal in size. There is   borderline left ventricular hypertrophy. Left ventricular diastolic  parameters are consistent with age-related delayed relaxation (normal).   Right Ventricle: The right ventricular size is normal. No increase in  right ventricular wall thickness. Right ventricular systolic function is  normal. There is normal pulmonary artery systolic pressure. The tricuspid  regurgitant velocity is 2.46 m/s, and   with an assumed right atrial pressure of 3 mmHg, the estimated right  ventricular systolic pressure is 40.1 mmHg.   Left Atrium: Left atrial size was normal in size.   Right Atrium: Right atrial size was normal in size.   Pericardium: There is no evidence of pericardial effusion.   Mitral Valve: The mitral valve is degenerative in appearance. There is  mild thickening of the mitral valve leaflet(s). Mild mitral valve  regurgitation. No evidence of mitral valve stenosis. MV peak gradient, 3.7  mmHg. The mean mitral valve gradient is  1.0 mmHg.   Tricuspid Valve: The tricuspid valve is normal in structure. Tricuspid  valve regurgitation is mild to moderate.   Aortic Valve: The aortic valve is tricuspid. Aortic valve regurgitation is  not visualized. No aortic stenosis is present. Aortic valve mean gradient  measures 4.0 mmHg. Aortic valve peak gradient measures 9.1 mmHg. Aortic  valve area, by VTI measures 2.19  cm.   Pulmonic Valve: The pulmonic valve was not well visualized. Pulmonic valve  regurgitation is mild.   Aorta: The aortic root is normal in size and structure.   Pulmonary Artery: The pulmonary artery is not well seen.   Venous: The inferior vena cava is normal in size with greater than 50%  respiratory variability, suggesting right atrial pressure of 3 mmHg.   IAS/Shunts: The interatrial  septum was not well visualized.   Specialists  [facility] saw 01/01/19 increased nocturia and urgency pending Botox to tx and UDS Dr. Junious Silk and try myrbetriq before UDS    Cards-Dr. Rockey Situ leb GI Leb GI Dr. Moss Mc MD Dr. Cordelia Pen [facility]-retinal specialist Dentist Dr. Lynelle Smoke Neurology-Leb Neurology Dr. Posey Pronto  ENT-Dr. Tami Ribas for vestibular balance issue s Podiatry Dr. Milinda Pointer H/o Dr. Earlie Server [facility] Lung-Dr. Patsey Berthold , Leb in [place] Ortho Dr. Alvan Dame Surgery Dr.Toth Ob/gyn-physicians for [facility]   Dermatology Dr. Delman Cheadle in [place] h/o [facility] left leg and MM right leg, neck and back with node dissection Dr. Marlou Starks 8/12    Former PCP Dr. Jossie Ng -pt brought in all records week of 09/21/2018 and reviewed -h/o iron def anemia -last seen 07/09/18 and 08/09/18 possible thrush and URI   US carotid 05/13/16 <50% stenosis in right and left ICA -she was at this time put on lipitor 10 mg qhs   CT ab/pelvis 07/26/17 left base scarring lung,  well circumcised liver lesion likely cysts and stable hepatomegaly 19.4 cm cranlocaudal pancreas/spleen normal, extensive colonic diverticulosis, aortic and branch vessell atherosclerosis no adenopathy, normal uterus and adnexa, advanced LS spondylosis grade 1 L4/5 and L5/S1 anterolisthesis are similar, pelvic floor laxity, hiatal hernia CXR 02/26/18 thoracic aorta mildly atherosclerotic unchanged, Deg changes T spine, Deg changes b/l shoulders prior right shoulder surgery calcification distal left supraspinatus tendon at its insertion on the great tuberosity of the left humerus     Provider: Dr. Olivia Mackie McLean-Scocuzza-Internal Medicine

## 2022-04-01 LAB — CBC WITH DIFFERENTIAL/PLATELET
Basophils Absolute: 0.1 10*3/uL (ref 0.0–0.1)
Basophils Relative: 1 % (ref 0.0–3.0)
Eosinophils Absolute: 0.2 10*3/uL (ref 0.0–0.7)
Eosinophils Relative: 2.2 % (ref 0.0–5.0)
HCT: 41.1 % (ref 36.0–46.0)
Hemoglobin: 13.5 g/dL (ref 12.0–15.0)
Lymphocytes Relative: 27.1 % (ref 12.0–46.0)
Lymphs Abs: 2.1 10*3/uL (ref 0.7–4.0)
MCHC: 32.8 g/dL (ref 30.0–36.0)
MCV: 91.4 fl (ref 78.0–100.0)
Monocytes Absolute: 0.6 10*3/uL (ref 0.1–1.0)
Monocytes Relative: 7.4 % (ref 3.0–12.0)
Neutro Abs: 4.8 10*3/uL (ref 1.4–7.7)
Neutrophils Relative %: 62.3 % (ref 43.0–77.0)
Platelets: 218 10*3/uL (ref 150.0–400.0)
RBC: 4.5 Mil/uL (ref 3.87–5.11)
RDW: 13.8 % (ref 11.5–15.5)
WBC: 7.7 10*3/uL (ref 4.0–10.5)

## 2022-04-01 LAB — URINALYSIS, ROUTINE W REFLEX MICROSCOPIC
Bacteria, UA: NONE SEEN /HPF
Bilirubin Urine: NEGATIVE
Glucose, UA: NEGATIVE
Hgb urine dipstick: NEGATIVE
Hyaline Cast: NONE SEEN /LPF
Nitrite: NEGATIVE
Protein, ur: NEGATIVE
Specific Gravity, Urine: 1.024 (ref 1.001–1.035)
pH: 5.5 (ref 5.0–8.0)

## 2022-04-01 LAB — COMPREHENSIVE METABOLIC PANEL
ALT: 14 U/L (ref 0–35)
AST: 17 U/L (ref 0–37)
Albumin: 4.4 g/dL (ref 3.5–5.2)
Alkaline Phosphatase: 74 U/L (ref 39–117)
BUN: 22 mg/dL (ref 6–23)
CO2: 21 mEq/L (ref 19–32)
Calcium: 9.4 mg/dL (ref 8.4–10.5)
Chloride: 103 mEq/L (ref 96–112)
Creatinine, Ser: 1.17 mg/dL (ref 0.40–1.20)
GFR: 43.83 mL/min — ABNORMAL LOW (ref >60.00)
Glucose, Bld: 73 mg/dL (ref 70–99)
Potassium: 4.8 mEq/L (ref 3.5–5.1)
Sodium: 138 mEq/L (ref 135–145)
Total Bilirubin: 1.1 mg/dL (ref 0.2–1.2)
Total Protein: 6.8 g/dL (ref 6.0–8.3)

## 2022-04-01 LAB — URINE CULTURE
MICRO NUMBER:: 13917968
SPECIMEN QUALITY:: ADEQUATE

## 2022-04-01 LAB — TSH: TSH: 1.28 u[IU]/mL (ref 0.35–5.50)

## 2022-04-01 LAB — HEMOGLOBIN A1C: Hgb A1c MFr Bld: 6.4 % (ref 4.6–6.5)

## 2022-04-01 LAB — VITAMIN D 25 HYDROXY (VIT D DEFICIENCY, FRACTURES): VITD: 48.6 ng/mL (ref 30.00–100.00)

## 2022-04-04 ENCOUNTER — Telehealth: Payer: Self-pay

## 2022-04-04 NOTE — Telephone Encounter (Signed)
Lvm for pt to return call in regards to lab results.  Per Dr.Tracy: Kidney function slightly increased  Rec water 55-64 ounces daily  Urine culture no UTI  Blood cts normal  Vitamin D normal  Thyroid lab normal  A1c worse 6.4=prediabetes rec healthy diet and exercise

## 2022-04-04 NOTE — Telephone Encounter (Signed)
Patient states she is returning our call.  I read Dr. Olivia Mackie McLean-Scocuzza's message to patient.  Patient states she still has a question, so I transferred her to [facility] [place].

## 2022-04-06 NOTE — Progress Notes (Signed)
Note has been faxed to number [phone] attn to Dr. Berdine Addison

## 2022-04-11 DIAGNOSIS — C44629 Squamous cell carcinoma of skin of left upper limb, including shoulder: Secondary | ICD-10-CM | POA: Diagnosis not present

## 2022-04-11 DIAGNOSIS — Z5189 Encounter for other specified aftercare: Secondary | ICD-10-CM | POA: Diagnosis not present

## 2022-04-15 ENCOUNTER — Telehealth: Payer: Self-pay | Admitting: Internal Medicine

## 2022-04-15 NOTE — Telephone Encounter (Signed)
Lft pt vm to call ofc . thanks 

## 2022-04-19 ENCOUNTER — Telehealth: Payer: Self-pay | Admitting: Internal Medicine

## 2022-04-19 NOTE — Telephone Encounter (Signed)
Please see message I rec [name] pt called back Maudie Mercury advise after Dr. Kavin Leech response

## 2022-04-19 NOTE — Telephone Encounter (Signed)
Patient would like to know if Dr Nicki Reaper would take her on as patient. Patient was advised that Dr Volanda Napoleon would be taking Dr Claris Gladden patients. Edwina Barth is a patient of Dr Bary Leriche.

## 2022-04-20 ENCOUNTER — Encounter: Payer: Self-pay | Admitting: Internal Medicine

## 2022-04-21 ENCOUNTER — Encounter: Payer: Self-pay | Admitting: Internal Medicine

## 2022-04-21 ENCOUNTER — Ambulatory Visit (INDEPENDENT_AMBULATORY_CARE_PROVIDER_SITE_OTHER): Payer: PPO | Admitting: Internal Medicine

## 2022-04-21 VITALS — BP 136/74 | HR 72 | Temp 98.1°F | Ht 66.0 in | Wt 191.0 lb

## 2022-04-21 DIAGNOSIS — Z23 Encounter for immunization: Secondary | ICD-10-CM | POA: Diagnosis not present

## 2022-04-21 DIAGNOSIS — R251 Tremor, unspecified: Secondary | ICD-10-CM | POA: Diagnosis not present

## 2022-04-21 DIAGNOSIS — R269 Unspecified abnormalities of gait and mobility: Secondary | ICD-10-CM | POA: Diagnosis not present

## 2022-04-21 DIAGNOSIS — Z1231 Encounter for screening mammogram for malignant neoplasm of breast: Secondary | ICD-10-CM | POA: Diagnosis not present

## 2022-04-21 DIAGNOSIS — I10 Essential (primary) hypertension: Secondary | ICD-10-CM

## 2022-04-21 DIAGNOSIS — Z2911 Encounter for prophylactic immunotherapy for respiratory syncytial virus (RSV): Secondary | ICD-10-CM

## 2022-04-21 MED ORDER — RSVPREF3 VAC RECOMB ADJUVANTED 120 MCG/0.5ML IM SUSR: 0.5000 mL | Freq: Once | INTRAMUSCULAR | 0 refills | Status: AC

## 2022-04-21 NOTE — Patient Instructions (Addendum)
Dr. Narda Amber -let us know if you need a referral  Dr. Volanda Napoleon (new PCP)  Covid.gov-free covid tests x 4   Phone Fax E-mail Address  [phone] Not available [address]   [address]   [place] [id number]     Specialties     Neurology      Essential Tremor A tremor is trembling or shaking that a person cannot control. Most tremors affect the hands or arms. Tremors can also affect the head, vocal cords, legs, and other parts of the body. Essential tremor is a tremor without a known cause. Usually, it occurs while a person is trying to perform an action. It tends to get worse gradually as a person ages. What are the causes? The cause of this condition is not known, but it often runs in families. What increases the risk? You are more likely to develop this condition if: You have a family member with essential tremor. You are 81 years of age or older. What are the signs or symptoms? The main sign of a tremor is a rhythmic shaking of certain parts of your body that is uncontrolled and unintentional. You may: Have difficulty eating with a spoon or fork. Have difficulty writing. Nod your head up and down or side to side. Have a quivering voice. The shaking may: Get worse over time. Come and go. Be more noticeable on one side of your body. Get worse due to stress, tiredness (fatigue), caffeine, and extreme heat or cold. How is this diagnosed? This condition may be diagnosed based on: Your symptoms and medical history. A physical exam. There is no single test to diagnose an essential tremor. However, your health care provider may order tests to rule out other causes of your condition. These may include: Blood and urine tests. Imaging studies of your brain, such as a CT scan or MRI. How is this treated? Treatment for essential tremor depends on the severity of the condition. Mild tremors may not need treatment if they do not affect your day-to-day life. Severe  tremors may need to be treated using one or more of the following options: Medicines. Injections of a substance called botulinum toxin. Procedures such as deep brain stimulation (DBS) implantation or MRI-guided ultrasound treatment. Lifestyle changes. Occupational or physical therapy. Follow these instructions at home: Lifestyle  Do not use any products that contain nicotine or tobacco. These products include cigarettes, chewing tobacco, and vaping devices, such as e-cigarettes. If you need help quitting, ask your health care provider. Limit your caffeine intake as told by your health care provider. Try to get 8 hours of sleep each night. Find ways to manage your stress that fit your lifestyle and personality. Consider trying meditation or yoga. Try to anticipate stressful situations and allow extra time to manage them. If you are struggling emotionally with the effects of your tremor, consider working with a mental health provider. General instructions Take over-the-counter and prescription medicines only as told by your health care provider. Avoid extreme heat and extreme cold. Keep all follow-up visits. This is important. Visits may include physical therapy visits. Where to find more information [employer] [facility] Neurological Disorders and Stroke: [employer] Contact a health care provider if: You experience any changes in the location or intensity of your tremors. You start having a tremor after starting a new medicine. You have a tremor with other symptoms, such as: Numbness. Tingling. Pain. Weakness. Your tremor gets worse. Your tremor interferes with your daily life.  You feel down, blue, or sad for at least 2 weeks in a row. Worrying about your tremor and what other people think about you interferes with your everyday life functions, including relationships, work, or school. Summary Essential tremor is a tremor without a known cause. Usually, it occurs when you are  trying to perform an action. You are more likely to develop this condition if you have a family member with essential tremor. The main sign of a tremor is a rhythmic shaking of certain parts of your body that is uncontrolled and unintentional. Treatment for essential tremor depends on the severity of the condition. This information is not intended to replace advice given to you by your health care provider. Make sure you discuss any questions you have with your health care provider. Document Revised: 04/23/2021 Document Reviewed: 04/23/2021 [employer] Patient Education  [place].

## 2022-04-21 NOTE — Progress Notes (Addendum)
Chief Complaint  Patient presents with   Follow-up    Follow up   F/u  1. Left arm swelling s/p skin cancer removal swelling in lower end of excision she f/u with Dr. Berdine Addison dermatologist who did excision and they do not see need for ultrasound  2. Htn controlled on benicar 10 mg bid  3. Tremor of b/l hands and abnormal gait she will f/u neurology prn Dr. Timoteo Expose she is not sure if being off effexor has helped as she just got off tuesday   Review of Systems  Constitutional:  Negative for weight loss.  HENT:  Negative for hearing loss.   Eyes:  Negative for blurred vision.  Respiratory:  Negative for shortness of breath.   Cardiovascular:  Negative for chest pain.  Gastrointestinal:  Negative for abdominal pain and blood in stool.  Genitourinary:  Negative for dysuria.  Musculoskeletal:  Negative for falls and joint pain.  Skin:  Negative for rash.  Neurological:  Negative for headaches.  Psychiatric/Behavioral:  Negative for depression.    Past Medical History:  Diagnosis Date   Acid reflux    Allergy    Anemia    Anxiety    in past   Arthritis    all over   Asthma    Atypical moles    Bronchitis    Bronchospasm    reactive with perfume/cologne and smoke   CAD (coronary artery disease)    patient denies on preop of 10/24    Candida esophagitis (HCC)    Carotid artery calcification, bilateral 04/2016   Cataract    Cervical stenosis of spine    Chronic insomnia    Complication of anesthesia    shoulder surgery- not all the way asleep   COVID-19 02/04/2021   also 10/3 or 04/20/21 sxs started 04/05/21   Depression    in past   Diverticulitis    Diverticulosis    Dizziness    Elevated liver enzymes    Family history of adverse reaction to anesthesia    sister- has problems with nausea and vomiting    Fatigue    Fatty liver    Generalized headache    migraines   GERD (gastroesophageal reflux disease)    History of chemotherapy    topical cream for h/o  skin CA   History of kidney stones    History of neuropathy    FH Dr. Posey Pronto neurology   History of vertigo    Hx of migraines    Hyperlipidemia    Hypothyroidism    Insomnia    Irritable bowel syndrome    LLQ abdominal pain    Melanoma ([address]) 2013   Melanoma ([place])    x2 stg II melanoma   Neuropathy, peripheral    Peripheral neuropathy    Pneumonia    hx of x 2   PONV (postoperative nausea and vomiting)    Pre-diabetes    Retina disorder    left   SCC (squamous cell carcinoma)    skin    Stroke ([address])    ? TIA per Dr V per patient    Thyroid disease    Urine incontinence    Vitamin D deficiency    Weight gain    Past Surgical History:  Procedure Laterality Date   ABLATION Bilateral    Dr. Lucky Cowboy 12/2020 veins laser ablation of both great saphenous veins in a staged fashion.   BILATERAL SALPINGOOPHORECTOMY     ovarian cyst Dr. Ouida Sills  and Dr. Alisia Ferrari 2008   BREAST SURGERY     in 2014 bx breast calcifications   BREAST SURGERY     augmentation with saline Dr. Hubbard Hartshorn 1995    CHOLECYSTECTOMY N/A 01/28/2015   Procedure: LAPAROSCOPIC CHOLECYSTECTOMY WITH INTRAOPERATIVE CHOLANGIOGRAM;  Surgeon: Autumn Messing III, MD;  Location: [facility];  Service: General;  Laterality: N/A;   CLOSED MANIPULATION SHOULDER     rt   COLONOSCOPY WITH PROPOFOL N/A 11/20/2012   Procedure: COLONOSCOPY WITH PROPOFOL;  Surgeon: Garlan Fair, MD;  Location: [facility];  Service: Endoscopy;  Laterality: N/A;   colonscopy     May 2014   JOINT REPLACEMENT     RTK   KNEE ARTHROSCOPY     left knee miniscus tear repair     MELANOMA EXCISION WITH SENTINEL LYMPH NODE BIOPSY  03/18/2011   Back, nodes neg   OOPHORECTOMY     ROTATOR CUFF REPAIR     left   SHOULDER SURGERY     right and left    SKIN CANCER EXCISION     multiple   TOTAL HIP ARTHROPLASTY     TOTAL KNEE ARTHROPLASTY     right 2010 and left in 2018    [place] Left 05/16/2017   Procedure: LEFT TOTAL KNEE ARTHROPLASTY;   Surgeon: Paralee Cancel, MD;  Location: [facility];  Service: Orthopedics;  Laterality: Left;  70 mins   TUBAL LIGATION     TUMOR REMOVAL     ovary   Family History  Problem Relation Age of Onset   Hypertension Mother    Stroke Mother    Hyperlipidemia Mother    Heart disease Father    Diabetes Father    Hypertension Father    Hyperlipidemia Father    Early death Father    Diabetes Sister    Heart disease Sister        heart murmur   Arthritis Sister    COPD Sister    Hyperlipidemia Sister    Hypertension Sister    Colon polyps Sister        adenomous   Arthritis Sister    Heart disease Sister    Hyperlipidemia Sister    Hypertension Sister    Cancer Sister    Dementia Sister    Tremor Sister    Stroke Maternal Grandmother    Colon cancer Maternal Grandfather    Cancer Maternal Grandfather    Early death Maternal Grandfather    Early death Paternal Grandmother    Stroke Paternal Grandmother    Heart disease Daughter    Hyperlipidemia Daughter    Hypertension Daughter    Heart disease Son    Hyperlipidemia Son    Alcohol abuse Son    Depression Son    Diabetes Son    Pancreatic cancer Maternal Uncle    Alzheimer's disease Paternal Aunt    Alzheimer's disease Paternal Aunt    Alzheimer's disease Paternal Aunt    Alzheimer's disease Paternal Aunt    Cancer Other        2 paternal uncles and 1 maternal uncle colon cancer   Esophageal cancer Neg Hx    Stomach cancer Neg Hx    Social History   Socioeconomic History   Marital status: Divorced    Spouse name: Not on file   Number of children: 3   Years of education: College   Highest education level: Some college, no degree  Occupational History   Occupation: Retired  Employer: RETIRED  Tobacco Use   Smoking status: Never   Smokeless tobacco: Never  Vaping Use   Vaping Use: Never used  Substance and Sexual Activity   Alcohol use: Yes    Alcohol/week: 1.0 standard drink of alcohol    Types: 1 Cans of beer  per week    Comment: once a week beer or wine   Drug use: No   Sexual activity: Not Currently  Other Topics Concern   Not on file  Social History Narrative   Patient lives at home alone in a one story home.   Has 3 children (2 daughters and 1 son)    Retired from [employer]   Divorced since 1992    Caffeine Use: 1 cup daily and a soft drink occasionally    No guns    Wears seat belt     Lives with cat    Left handed   Social Determinants of Health   Financial Resource Strain: Low Risk  (11/10/2021)   Overall Financial Resource Strain (CARDIA)    Difficulty of Paying Living Expenses: Not hard at all  Food Insecurity: No Food Insecurity (11/10/2021)   Hunger Vital Sign    Worried About Running Out of Food in the Last Year: Never true    Ran Out of Food in the Last Year: Never true  Transportation Needs: No Transportation Needs (11/10/2021)   PRAPARE - Hydrologist (Medical): No    Lack of Transportation (Non-Medical): No  Physical Activity: Sufficiently Active (11/10/2021)   Exercise Vital Sign    Days of Exercise per Week: 6 days    Minutes of Exercise per Session: 60 min  Stress: No Stress Concern Present (11/10/2021)   [place]    Feeling of Stress : Only a little  Social Connections: Unknown (11/10/2021)   Social Connection and Isolation Panel [NHANES]    Frequency of Communication with Friends and Family: More than three times a week    Frequency of Social Gatherings with Friends and Family: Not on file    Attends Religious Services: More than 4 times per year    Active Member of [employer] or Organizations: Yes    Attends Archivist Meetings: Not on file    Marital Status: Not on file  Intimate Partner Violence: Not At Risk (11/10/2021)   Humiliation, Afraid, Rape, and Kick questionnaire    Fear of Current or Ex-Partner: No    Emotionally Abused: No     Physically Abused: No    Sexually Abused: No   Current Meds  Medication Sig   albuterol (PROAIR HFA) 108 (90 Base) MCG/ACT inhaler Inhale 1-2 puffs into the lungs every 6 (six) hours as needed for wheezing or shortness of breath.   aspirin 81 MG tablet TAKE 1 TABLET BY MOUTH EVERY DAY   atorvastatin (LIPITOR) 20 MG tablet Take 1 tablet (20 mg total) by mouth daily.   Calcium 600-200 MG-UNIT tablet Take 1 tablet daily by mouth.   Cholecalciferol (VITAMIN D3) 2000 UNITS capsule Take 2,000 Units by mouth daily.   citalopram (CELEXA) 20 MG tablet Take 1 tablet (20 mg total) by mouth daily.   colestipol (COLESTID) 1 g tablet Take 1 g by mouth 2 (two) times daily.   doxycycline (VIBRA-TABS) 100 MG tablet Take 1 tablet (100 mg total) by mouth 2 (two) times daily. With food x 7-10 days with food   FOLIC ACID  PO Take by mouth daily.   levothyroxine (SYNTHROID) 75 MCG tablet Take 1 tablet (75 mcg total) by mouth daily before breakfast.   loperamide (IMODIUM A-D) 2 MG tablet Take 1 tablet (2 mg total) by mouth as needed for diarrhea or loose stools.   neomycin-polymyxin-hydrocortisone (CORTISPORIN) OTIC solution Place 4 drops into the right ear 3 (three) times daily. Right ear pain   olmesartan (BENICAR) 20 MG tablet Take 0.5 tablets (10 mg total) by mouth in the morning and at bedtime. D/c 5 mg (10 mg bid of 5 mg)   Omega-3 Fatty Acids (FISH OIL) 1000 MG CAPS Take 1,000 mg by mouth daily.    omeprazole (PRILOSEC) 20 MG capsule Take 1 capsule (20 mg total) by mouth 2 (two) times daily before a meal.   RSV vaccine recomb adjuvanted (AREXVY) 120 MCG/0.5ML injection Inject 0.5 mLs into the muscle once for 1 dose.   triamcinolone cream (KENALOG) 0.1 % Apply 1 Application topically 2 (two) times daily. Prn legs   vitamin B-12 (CYANOCOBALAMIN) 1000 MCG tablet Take 2,000 mcg by mouth daily. 2 tabs   Allergies  Allergen Reactions   Epinephrine Other (See Comments) and Anaphylaxis    Heart palpitations    Penicillins Swelling and Other (See Comments)    Lip swelling Has patient had a PCN reaction causing immediate rash, facial/tongue/throat swelling, SOB or lightheadedness with hypotension: Yes Has patient had a PCN reaction causing severe rash involving mucus membranes or skin necrosis: No Has patient had a PCN reaction that required hospitalization No Has patient had a PCN reaction occurring within the last 10 years: No If all of the above answers are "NO", then may proceed with Cephalosporin use.   Lidocaine Hcl Other (See Comments)   Other     Black pepper- caused bronchial tubes to start closing    Valium [Diazepam] Other (See Comments)    Other reaction(s): Other (See Comments) Overly sensitive  sedation   Recent Results (from the past 2160 hour(s))  HM MAMMOGRAPHY     Status: None   Collection Time: 02/14/22 12:00 AM  Result Value Ref Range   HM Mammogram 0-4 Bi-Rad 0-4 Bi-Rad, Self Reported Normal    Comment: neg [facility]  HM MAMMOGRAPHY     Status: None   Collection Time: 02/14/22 12:00 AM  Result Value Ref Range   HM Mammogram 0-4 Bi-Rad 0-4 Bi-Rad, Self Reported Normal    Comment: [facility] negative  Comprehensive metabolic panel     Status: Abnormal   Collection Time: 03/31/22  3:15 PM  Result Value Ref Range   Sodium 138 135 - 145 mEq/L   Potassium 4.8 3.5 - 5.1 mEq/L   Chloride 103 96 - 112 mEq/L   CO2 21 19 - 32 mEq/L   Glucose, Bld 73 70 - 99 mg/dL   BUN 22 6 - 23 mg/dL   Creatinine, Ser 1.17 0.40 - 1.20 mg/dL   Total Bilirubin 1.1 0.2 - 1.2 mg/dL   Alkaline Phosphatase 74 39 - 117 U/L   AST 17 0 - 37 U/L   ALT 14 0 - 35 U/L   Total Protein 6.8 6.0 - 8.3 g/dL   Albumin 4.4 3.5 - 5.2 g/dL   GFR 43.83 (L) >60.00 mL/min    Comment: Calculated using the CKD-EPI Creatinine Equation (2021)   Calcium 9.4 8.4 - 10.5 mg/dL  CBC with Differential/Platelet     Status: None   Collection Time: 03/31/22  3:15 PM  Result Value Ref Range  WBC 7.7 4.0 - 10.5 K/uL   RBC 4.50  3.87 - 5.11 Mil/uL   Hemoglobin 13.5 12.0 - 15.0 g/dL   HCT 41.1 36.0 - 46.0 %   MCV 91.4 78.0 - 100.0 fl   MCHC 32.8 30.0 - 36.0 g/dL   RDW 13.8 11.5 - 15.5 %   Platelets 218.0 150.0 - 400.0 K/uL   Neutrophils Relative % 62.3 43.0 - 77.0 %   Lymphocytes Relative 27.1 12.0 - 46.0 %   Monocytes Relative 7.4 3.0 - 12.0 %   Eosinophils Relative 2.2 0.0 - 5.0 %   Basophils Relative 1.0 0.0 - 3.0 %   Neutro Abs 4.8 1.4 - 7.7 K/uL   Lymphs Abs 2.1 0.7 - 4.0 K/uL   Monocytes Absolute 0.6 0.1 - 1.0 K/uL   Eosinophils Absolute 0.2 0.0 - 0.7 K/uL   Basophils Absolute 0.1 0.0 - 0.1 K/uL  Vitamin D (25 hydroxy)     Status: None   Collection Time: 03/31/22  3:15 PM  Result Value Ref Range   VITD 48.60 30.00 - 100.00 ng/mL  TSH     Status: None   Collection Time: 03/31/22  3:15 PM  Result Value Ref Range   TSH 1.28 0.35 - 5.50 uIU/mL  Hemoglobin A1c     Status: None   Collection Time: 03/31/22  3:15 PM  Result Value Ref Range   Hgb A1c MFr Bld 6.4 4.6 - 6.5 %    Comment: Glycemic Control Guidelines for People with Diabetes:Non Diabetic:  <6%Goal of Therapy: <7%Additional Action Suggested:  >8%   Urinalysis, Routine w reflex microscopic     Status: Abnormal   Collection Time: 03/31/22  3:15 PM  Result Value Ref Range   Color, Urine YELLOW YELLOW   APPearance CLEAR CLEAR   Specific Gravity, Urine 1.024 1.001 - 1.035   pH 5.5 5.0 - 8.0   Glucose, UA NEGATIVE NEGATIVE   Bilirubin Urine NEGATIVE NEGATIVE   Ketones, ur TRACE (A) NEGATIVE   Hgb urine dipstick NEGATIVE NEGATIVE   Protein, ur NEGATIVE NEGATIVE   Nitrite NEGATIVE NEGATIVE   Leukocytes,Ua TRACE (A) NEGATIVE   WBC, UA 0-5 0 - 5 /HPF   RBC / HPF 0-2 0 - 2 /HPF   Squamous Epithelial / LPF 0-5 < OR = 5 /HPF   Bacteria, UA NONE SEEN NONE SEEN /HPF   Hyaline Cast NONE SEEN NONE SEEN /LPF  Urine Culture     Status: None   Collection Time: 03/31/22  3:15 PM   Specimen: Urine  Result Value Ref Range   MICRO NUMBER: [id number]     SPECIMEN QUALITY: Adequate    Sample Source URINE, CLEAN CATCH    STATUS: FINAL    Result:      Mixed genital flora isolated. These superficial bacteria are not indicative of a urinary tract infection. No further organism identification is warranted on this specimen. If clinically indicated, recollect clean-catch, mid-stream urine and transfer  immediately to Urine Culture Transport Tube.    Objective  Body mass index is 30.83 kg/m. Wt Readings from Last 3 Encounters:  04/21/22 191 lb (86.6 kg)  03/31/22 190 lb 3.2 oz (86.3 kg)  01/12/22 194 lb 12.8 oz (88.4 kg)   Temp Readings from Last 3 Encounters:  04/21/22 98.1 F (36.7 C) (Oral)  03/31/22 98.4 F (36.9 C)  01/12/22 98.2 F (36.8 C) (Oral)   BP Readings from Last 3 Encounters:  04/21/22 136/74  03/31/22 120/60  01/12/22 120/70  Pulse Readings from Last 3 Encounters:  04/21/22 72  03/31/22 70  01/12/22 64    Physical Exam Vitals and nursing note reviewed.  Constitutional:      Appearance: Normal appearance. She is well-developed and well-groomed.  HENT:     Head: Normocephalic and atraumatic.  Eyes:     Conjunctiva/sclera: Conjunctivae normal.     Pupils: Pupils are equal, round, and reactive to light.  Cardiovascular:     Rate and Rhythm: Normal rate and regular rhythm.     Heart sounds: Normal heart sounds. No murmur heard. Pulmonary:     Effort: Pulmonary effort is normal.     Breath sounds: Normal breath sounds.  Abdominal:     General: Abdomen is flat. Bowel sounds are normal.     Tenderness: There is no abdominal tenderness.  Musculoskeletal:        General: No tenderness.  Skin:    General: Skin is warm and dry.  Neurological:     General: No focal deficit present.     Mental Status: She is alert and oriented to person, place, and time. Mental status is at baseline.     Cranial Nerves: Cranial nerves 2-12 are intact.     Sensory: Sensation is intact.     Motor: Motor function is intact.      Gait: Gait is intact.     Comments: Has cane today  Psychiatric:        Attention and Perception: Attention and perception normal.        Mood and Affect: Mood and affect normal.        Speech: Speech normal.        Behavior: Behavior normal. Behavior is cooperative.        Thought Content: Thought content normal.        Cognition and Memory: Cognition and memory normal.        Judgment: Judgment normal.     Assessment  Plan  Essential hypertension Controlled on benicar 10 mg bid   Tremor of both hands, abnormal gait Stopped effexor last visit and changed to ssri  F/u with [facility] neurology Dr. Posey Pronto in the future prn call for referral if needed but last seen in 2022   HM Flu shot given today  Rsv vaccine consider rx today  Tdap -had TD 10/18/12 not pertussis will need this in future  shingrix rx previously and will get at pharmacy zostervax had 06/16/11  prevnar had 05/15/14  covid 4/4 vaccines utd pfizer covid + 02/04/21 and 04/05/21-04/20/21  Pna 23 had 12/16/05, 05/18/16    Pap out of age window LMP 1990s both ovaries and Fts removed 2008 -referred 05/05/21 [facility] female pelvic pain h/o genital warts Seen 08/2021 Dr.Megan Blanding with implants 01/21/20 normal  -10/18/16 screening with implants neg FH breast cancer  p cousins dx'ed in 46s x 3, sister  Had at age 87 y.o and colon cancer age 30, 2 paternal cousins ovarian cancer age 27 and 53  01/21/20 negative ordered [facility] for 01/20/21 and scheduled and neg  02/14/22 negative    Colonoscopy 11/2012 tortuous colon no further rec.  -she is established and f/u GI Dr. Farrel Gordon [place] virtual coloscopy had 02/25/20 -[facility]  02/25/20 CT colonoscopy-[facility]   IMPRESSION: 1. Suboptimal evaluation of the sigmoid, secondary to underdistention. No circumferential mass within this region. More proximally, no evidence of clinically significant colonic polyp or mass. 2. Small hiatal hernia. 3. Aortic Atherosclerosis (ICD10-I70.0).  DEXA [facility] PCP 06/28/16 normal reviewed report did not scan into chart -h/o vitamin D def on D3 2000 IU daily     [facility]atology" [facility] Dr. Delman Cheadle (dermatology specialist of [place]) mohs surgery  F/u from 04/10/19 left lower leg cellulitis for SCC changed to levaquin rec compression socks and acidic acid soaks qhs   05/15/19 f/u appt s/p SCC mohs removal f/u in 3 months Dr. Winifred Olive [facility]  F/u with Dr. Delman Cheadle derm and [facility] mohs as scheduled freq f/us   Echo 01/07/20  FINDINGS   Left Ventricle: Left ventricular ejection fraction, by estimation, is 60  to 65%. The left ventricle has normal function. The left ventricle has no  regional wall motion abnormalities. The left ventricular internal cavity  size was normal in size. There is   borderline left ventricular hypertrophy. Left ventricular diastolic  parameters are consistent with age-related delayed relaxation (normal).   Right Ventricle: The right ventricular size is normal. No increase in  right ventricular wall thickness. Right ventricular systolic function is  normal. There is normal pulmonary artery systolic pressure. The tricuspid  regurgitant velocity is 2.46 m/s, and   with an assumed right atrial pressure of 3 mmHg, the estimated right  ventricular systolic pressure is 56.4 mmHg.   Left Atrium: Left atrial size was normal in size.   Right Atrium: Right atrial size was normal in size.   Pericardium: There is no evidence of pericardial effusion.   Mitral Valve: The mitral valve is degenerative in appearance. There is  mild thickening of the mitral valve leaflet(s). Mild mitral valve  regurgitation. No evidence of mitral valve stenosis. MV peak gradient, 3.7  mmHg. The mean mitral valve gradient is  1.0 mmHg.   Tricuspid Valve: The tricuspid valve is normal in structure. Tricuspid  valve regurgitation is mild to moderate.   Aortic Valve: The aortic valve is tricuspid. Aortic valve regurgitation  is  not visualized. No aortic stenosis is present. Aortic valve mean gradient  measures 4.0 mmHg. Aortic valve peak gradient measures 9.1 mmHg. Aortic  valve area, by VTI measures 2.19  cm.   Pulmonic Valve: The pulmonic valve was not well visualized. Pulmonic valve  regurgitation is mild.   Aorta: The aortic root is normal in size and structure.   Pulmonary Artery: The pulmonary artery is not well seen.   Venous: The inferior vena cava is normal in size with greater than 50%  respiratory variability, suggesting right atrial pressure of 3 mmHg.   IAS/Shunts: The interatrial septum was not well visualized.   Specialists  [facility] saw 01/01/19 increased nocturia and urgency pending Botox to tx and UDS Dr. Junious Silk and try myrbetriq before UDS    Cards-Dr. Rockey Situ [facility] GI Dr. Moss Mc MD Dr. Cordelia Pen [facility]-retinal specialist Dentist Dr. Lynelle Smoke Neurology-[facility] Neurology Dr. Posey Pronto  ENT-Dr. Tami Ribas for vestibular balance issue s Podiatry Dr. Milinda Pointer H/o Dr. Earlie Server [facility]-Dr. Patsey Berthold , [facility] in [place] Ortho Dr. Alvan Dame Surgery Dr.Toth [facility] Moh[facility] Dr. Winifred Olive Derm-Derm specialist of [place]   Dermatology Dr. Delman Cheadle in [place] h/o [facility] left leg and MM right leg, neck and back with node dissection Dr. Marlou Starks 8/12    Former PCP Dr. Jossie Ng -pt brought in all records week of 09/21/2018 and reviewed -h/o iron def anemia -last seen 07/09/18 and 08/09/18 possible thrush and URI   US carotid 05/13/16 <50% stenosis in right and left ICA -she was at this time put on  lipitor 10 mg qhs   CT ab/pelvis 07/26/17 left base scarring lung, well circumcised liver lesion likely cysts and stable hepatomegaly 19.4 cm cranlocaudal pancreas/spleen normal, extensive colonic diverticulosis, aortic and branch vessell atherosclerosis no adenopathy, normal uterus and adnexa, advanced LS spondylosis grade 1 L4/5 and L5/S1 anterolisthesis are similar, pelvic floor laxity, hiatal hernia CXR  02/26/18 thoracic aorta mildly atherosclerotic unchanged, Deg changes T spine, Deg changes b/l shoulders prior right shoulder surgery calcification distal left supraspinatus tendon at its insertion on the great tuberosity of the left humerus   Provider: Dr. Olivia Mackie McLean-Scocuzza-Internal Medicine

## 2022-04-28 DIAGNOSIS — Z6830 Body mass index (BMI) 30.0-30.9, adult: Secondary | ICD-10-CM | POA: Diagnosis not present

## 2022-04-28 DIAGNOSIS — J069 Acute upper respiratory infection, unspecified: Secondary | ICD-10-CM | POA: Diagnosis not present

## 2022-04-28 DIAGNOSIS — Z03818 Encounter for observation for suspected exposure to other biological agents ruled out: Secondary | ICD-10-CM | POA: Diagnosis not present

## 2022-04-28 DIAGNOSIS — R051 Acute cough: Secondary | ICD-10-CM | POA: Diagnosis not present

## 2022-05-16 ENCOUNTER — Encounter (INDEPENDENT_AMBULATORY_CARE_PROVIDER_SITE_OTHER): Payer: Self-pay

## 2022-05-24 NOTE — Telephone Encounter (Signed)
I called the patient and LVM for her to call back because we would like to see if we could change her appointment on tomorrow to a virtual visit instead of coming int he office. To keep covid symptoms down in the office.   Fermina Mishkin,cma

## 2022-05-25 ENCOUNTER — Telehealth (INDEPENDENT_AMBULATORY_CARE_PROVIDER_SITE_OTHER): Payer: PPO | Admitting: Family Medicine

## 2022-05-25 DIAGNOSIS — J069 Acute upper respiratory infection, unspecified: Secondary | ICD-10-CM

## 2022-05-25 NOTE — Progress Notes (Signed)
Virtual Visit via Telephone Note  I connected with [name] on 06/09/22 at 1400 by telephone and verified that I am speaking with the correct person using two identifiers. [name] is currently located at home and is alone during this visit. The provider, Carollee Leitz, MD, is located in their office at time of visit.  I discussed the limitations, risks, security and privacy concerns of performing an evaluation and management service by telephone and the availability of in person appointments. I also discussed with the patient that there may be a patient responsible charge related to this service. The patient expressed understanding and agreed to proceed.  Subjective: PCP: McLean-Scocuzza, Nino Glow, MD  Chief Complaint  Patient presents with   Cough    Coughing & wheezing started Monday night    Patient reports symptoms started 2 nights ago.  Endorses soreness in chest area and wheezing on Monday night.  Last night had increasing wheeze and cough.  Took Coricidin and helped.  Reports wheezing only happens when she is lying down.  Nonproductive cough.  Denies any fevers, shortness of breath, chest pain, nausea/vomiting, diarrhea, recent sick contacts or travel.  Endorses nasal congestion and runny nose.  Lives alone with her dog.  Vital signs taken at home today oxygen saturation 98%, blood pressure is 145/68 with heart rate of 60.  Reports taking a COVID test 3 weeks ago that was negative.  Has albuterol inhaler but has not had heat increase its use.   ROS: Per HPI  Current Outpatient Medications:    albuterol (PROAIR HFA) 108 (90 Base) MCG/ACT inhaler, Inhale 1-2 puffs into the lungs every 6 (six) hours as needed for wheezing or shortness of breath., Disp: 18 g, Rfl: 11   aspirin 81 MG tablet, TAKE 1 TABLET BY MOUTH EVERY DAY, Disp: , Rfl:    atorvastatin (LIPITOR) 20 MG tablet, Take 1 tablet (20 mg total) by mouth daily., Disp: 90 tablet, Rfl: 3   Calcium 600-200 MG-UNIT tablet,  Take 1 tablet daily by mouth., Disp: , Rfl:    Cholecalciferol (VITAMIN D3) 2000 UNITS capsule, Take 2,000 Units by mouth daily., Disp: , Rfl:    colestipol (COLESTID) 1 g tablet, Take 1 g by mouth 2 (two) times daily., Disp: , Rfl:    FOLIC ACID PO, Take by mouth daily., Disp: , Rfl:    levothyroxine (SYNTHROID) 75 MCG tablet, Take 1 tablet (75 mcg total) by mouth daily before breakfast., Disp: 90 tablet, Rfl: 3   Omega-3 Fatty Acids (FISH OIL) 1000 MG CAPS, Take 1,000 mg by mouth daily. , Disp: , Rfl:    omeprazole (PRILOSEC) 20 MG capsule, Take 1 capsule (20 mg total) by mouth 2 (two) times daily before a meal., Disp: 180 capsule, Rfl: 3   triamcinolone cream (KENALOG) 0.1 %, Apply 1 Application topically 2 (two) times daily. Prn legs, Disp: 454 g, Rfl: 0   vitamin B-12 (CYANOCOBALAMIN) 1000 MCG tablet, Take 2,000 mcg by mouth daily. 2 tabs, Disp: , Rfl:    citalopram (CELEXA) 20 MG tablet, Take 1 tablet (20 mg total) by mouth daily., Disp: 90 tablet, Rfl: 3   loperamide (IMODIUM A-D) 2 MG tablet, Take 1 tablet (2 mg total) by mouth as needed for diarrhea or loose stools., Disp: 30 tablet, Rfl: 0   neomycin-polymyxin-hydrocortisone (CORTISPORIN) OTIC solution, Place 4 drops into the right ear 3 (three) times daily. Right ear pain (Patient not taking: Reported on 05/25/2022), Disp: 10 mL, Rfl: 0  olmesartan (BENICAR) 20 MG tablet, Take 0.5 tablets (10 mg total) by mouth in the morning and at bedtime. D/c 5 mg (10 mg bid of 5 mg), Disp: 90 tablet, Rfl: 3  Observations/Objective: A&O  No respiratory distress or wheezing audible over the phone Mood, judgement, and thought processes all WNL  Assessment and Plan: Viral URI with cough Assessment & Plan: Recent onset of symptoms, suspect viral etiology given no fevers and improvement with Coricidin.  Has not to increase use of albuterol inhaler.  Exam limited given telephone visit and not able to visualize patient.  No audible wheezing, shortness of  breath during visit.  No acute respiratory distress. -Can increase use of albuterol inhaler for shortness of breath and wheezing -Honey tea to help with cough -Tylenol 325 to 500 mg every 6 hours as needed for discomfort -Stay hydrated -Follow-up in 1 week if no improvement -Strict return precautions provided     Follow Up Instructions: Return in about 1 week (around 06/01/2022), or if symptoms worsen or fail to improve.  I discussed the assessment and treatment plan with the patient. The patient was provided an opportunity to ask questions and all were answered. The patient agreed with the plan and demonstrated an understanding of the instructions.   The patient was advised to call back or seek an in-person evaluation if the symptoms worsen or if the condition fails to improve as anticipated.  The above assessment and management plan was discussed with the patient. The patient verbalized understanding of and has agreed to the management plan. Patient is aware to call the clinic if symptoms persist or worsen. Patient is aware when to return to the clinic for a follow-up visit. Patient educated on when it is appropriate to go to the emergency department.   Time call ended: 1425  I provided 25 minutes of non-face-to-face time during this encounter.  Carollee Leitz, MD

## 2022-05-26 ENCOUNTER — Other Ambulatory Visit: Payer: Self-pay | Admitting: Family Medicine

## 2022-05-26 ENCOUNTER — Telehealth: Payer: Self-pay | Admitting: Internal Medicine

## 2022-05-26 NOTE — Telephone Encounter (Signed)
Pt called stating she is still having wheezing and coughing. Pt would like to be called

## 2022-05-26 NOTE — Telephone Encounter (Signed)
She needs to be evaluated.  Please have her schedule an appointment

## 2022-05-26 NOTE — Telephone Encounter (Signed)
Patient is scheduled for Monday 05/30/22 at 4:00 due to no one else having availability and I let Patient know if she needs a provider before her appointment to go to [facility] or ED. Patient stated how about [facility] and I told her that would be fine.

## 2022-05-26 NOTE — Telephone Encounter (Signed)
Patient is still wheezing and coughing. Patient is taking Corcidin cough syrup and rescue inhaler. Patient states she is having head congestion with white mucous.

## 2022-05-30 ENCOUNTER — Ambulatory Visit (INDEPENDENT_AMBULATORY_CARE_PROVIDER_SITE_OTHER): Payer: PPO

## 2022-05-30 ENCOUNTER — Ambulatory Visit (INDEPENDENT_AMBULATORY_CARE_PROVIDER_SITE_OTHER): Payer: PPO | Admitting: Family Medicine

## 2022-05-30 ENCOUNTER — Encounter: Payer: Self-pay | Admitting: Family Medicine

## 2022-05-30 VITALS — BP 114/72 | HR 69 | Temp 98.5°F | Ht 66.0 in | Wt 196.2 lb

## 2022-05-30 DIAGNOSIS — J9811 Atelectasis: Secondary | ICD-10-CM | POA: Diagnosis not present

## 2022-05-30 DIAGNOSIS — K449 Diaphragmatic hernia without obstruction or gangrene: Secondary | ICD-10-CM | POA: Diagnosis not present

## 2022-05-30 DIAGNOSIS — R062 Wheezing: Secondary | ICD-10-CM

## 2022-05-30 DIAGNOSIS — J069 Acute upper respiratory infection, unspecified: Secondary | ICD-10-CM | POA: Diagnosis not present

## 2022-05-30 NOTE — Patient Instructions (Addendum)
It was a pleasure meeting you today. Thank you for allowing me to take part in your health care.  Our goals for today as we discussed include:  For your wheezing and shortness of breath Will get a chest xray to rule out any pneumonia Can increase your Albuterol to 2 puff every 4-6 hours as needed  If you develop any increase in work of breathing, shortness of breath or chest pain please call 911 or have someone take you to the Emergency department  Please follow-up with PCP as scheduled in Feb or sooner if needed  If you have any questions or concerns, please do not hesitate to call the office at [phone].  I look forward to our next visit and until then take care and stay safe.  Regards,   Carollee Leitz, MD   [facility]

## 2022-05-30 NOTE — Progress Notes (Signed)
   SUBJECTIVE:   Chief Complaint  Patient presents with   Cough    Coughing & wheezing since last Wednesday Lungs in back was hurting last week   HPI  Patient presents to clinic for cough and wheezing.  Has been ongoing for 7 days.  Was last seen virtually on 11/08 for viral URI with cough.  Continues to have similar symptoms.  Has been taking her albuterol inhaler 2 times a day.  Denies any fevers, shortness of breath or chest pain.  Appetite remains good and hydrating well.  Reports last week her back was hurting but this is since resolved.  PERTINENT PMH / PSH: Hypertension Asthma GERD Anxiety/depression  OBJECTIVE:  BP 114/72 (BP Location: Left Arm, Patient Position: Sitting, Cuff Size: Normal)   Pulse 69   Temp 98.5 F (36.9 C) (Oral)   Ht '5\' 6"'$  (1.676 m)   Wt 196 lb 3.2 oz (89 kg)   SpO2 99%   BMI 31.67 kg/m    Physical Exam Constitutional:      General: She is not in acute distress.    Appearance: She is normal weight. She is not ill-appearing.  HENT:     Head: Normocephalic.  Eyes:     Conjunctiva/sclera: Conjunctivae normal.  Cardiovascular:     Rate and Rhythm: Normal rate and regular rhythm.     Pulses: Normal pulses.  Pulmonary:     Effort: Pulmonary effort is normal.     Breath sounds: Normal breath sounds. No wheezing or rhonchi.  Abdominal:     General: Bowel sounds are normal.  Neurological:     Mental Status: She is alert. Mental status is at baseline.  Psychiatric:        Mood and Affect: Mood normal.        Behavior: Behavior normal.        Thought Content: Thought content normal.        Judgment: Judgment normal.     ASSESSMENT/PLAN:  Viral URI with cough Assessment & Plan: Suspect continued viral etiology.  Remains afebrile, hemodynamically stable.  Low suspicion for PE given Wells score 0.  Do not suspect cardiac etiology given euvolemia on exam.  Lung exam clear, diminished at bases. Given ongoing cough plan for chest x-ray  today Increase albuterol to 1 to 2 puffs every 6 hours for wheezing and shortness of breath.  Orders: -     DG Chest 2 View; Future   PDMP reviewed  Return if symptoms worsen or fail to improve.  Carollee Leitz, MD

## 2022-06-01 ENCOUNTER — Telehealth: Payer: Self-pay | Admitting: Internal Medicine

## 2022-06-01 NOTE — Telephone Encounter (Signed)
Pt need refill on atorvastatin sent to [facility] and pt has not heard back from her xray results

## 2022-06-02 ENCOUNTER — Encounter: Payer: Self-pay | Admitting: Family Medicine

## 2022-06-02 NOTE — Telephone Encounter (Signed)
Letter with results sent

## 2022-06-09 ENCOUNTER — Encounter: Payer: Self-pay | Admitting: Family Medicine

## 2022-06-09 DIAGNOSIS — J069 Acute upper respiratory infection, unspecified: Secondary | ICD-10-CM

## 2022-06-09 HISTORY — DX: Acute upper respiratory infection, unspecified: J06.9

## 2022-06-09 NOTE — Patient Instructions (Addendum)
It was a pleasure meeting you today. Thank you for allowing me to take part in your health care.  Our goals for today as we discussed include:  Sounds like you have an upper respiratory infection that is likely due to a virus.  Symptomatic management for fever, muscle aches and headaches  -Tylenol 325-500 mg every 6 hours as needed -Ibuprofen 200 mg every 8 hours as needed -Use your albuterol inhaler 1 to 2 puffs every 4-6 hours for shortness of breath and wheezing  Stay well hydrated Rest as needed with frequent repositioning and ambulation.  Increase activity as soon as tolerated to help with recovery.   If you have any worsening shortness of breath, chest pain or fever (>100.4), oxygen level less than 92% please call 911 or have someone take you to the emergency department.   If you have any questions or concerns, please do not hesitate to call the office at [phone].  I look forward to our next visit and until then take care and stay safe.  Regards,   Carollee Leitz, MD   [facility]

## 2022-06-09 NOTE — Assessment & Plan Note (Signed)
Recent onset of symptoms, suspect viral etiology given no fevers and improvement with Coricidin.  Has not to increase use of albuterol inhaler.  Exam limited given telephone visit and not able to visualize patient.  No audible wheezing, shortness of breath during visit.  No acute respiratory distress. -Can increase use of albuterol inhaler for shortness of breath and wheezing -Honey tea to help with cough -Tylenol 325 to 500 mg every 6 hours as needed for discomfort -Stay hydrated -Follow-up in 1 week if no improvement -Strict return precautions provided

## 2022-06-10 ENCOUNTER — Other Ambulatory Visit: Payer: Self-pay | Admitting: Gastroenterology

## 2022-06-13 ENCOUNTER — Encounter: Payer: Self-pay | Admitting: Family Medicine

## 2022-06-13 DIAGNOSIS — Z5189 Encounter for other specified aftercare: Secondary | ICD-10-CM | POA: Diagnosis not present

## 2022-06-13 DIAGNOSIS — Z8589 Personal history of malignant neoplasm of other organs and systems: Secondary | ICD-10-CM | POA: Diagnosis not present

## 2022-06-13 DIAGNOSIS — I890 Lymphedema, not elsewhere classified: Secondary | ICD-10-CM | POA: Diagnosis not present

## 2022-06-13 NOTE — Assessment & Plan Note (Addendum)
Suspect continued viral etiology.  Remains afebrile, hemodynamically stable.  Low suspicion for PE given Wells score 0.  Do not suspect cardiac etiology given euvolemia on exam.  Lung exam clear, diminished at bases. Given ongoing cough plan for chest x-ray today Increase albuterol to 1 to 2 puffs every 6 hours for wheezing and shortness of breath.

## 2022-06-17 ENCOUNTER — Other Ambulatory Visit: Payer: Self-pay | Admitting: Dermatology

## 2022-06-17 DIAGNOSIS — I890 Lymphedema, not elsewhere classified: Secondary | ICD-10-CM

## 2022-06-20 ENCOUNTER — Other Ambulatory Visit: Payer: Self-pay | Admitting: Dermatology

## 2022-06-20 DIAGNOSIS — I890 Lymphedema, not elsewhere classified: Secondary | ICD-10-CM

## 2022-06-21 ENCOUNTER — Ambulatory Visit
Admission: RE | Admit: 2022-06-21 | Discharge: 2022-06-21 | Disposition: A | Discharge status: Home / Self Care | Payer: PPO | Source: Ambulatory Visit | Attending: Dermatology | Admitting: Dermatology

## 2022-06-21 DIAGNOSIS — M7989 Other specified soft tissue disorders: Secondary | ICD-10-CM | POA: Diagnosis not present

## 2022-06-21 DIAGNOSIS — I890 Lymphedema, not elsewhere classified: Secondary | ICD-10-CM | POA: Insufficient documentation

## 2022-06-30 DIAGNOSIS — H43811 Vitreous degeneration, right eye: Secondary | ICD-10-CM | POA: Diagnosis not present

## 2022-06-30 DIAGNOSIS — H2513 Age-related nuclear cataract, bilateral: Secondary | ICD-10-CM | POA: Diagnosis not present

## 2022-06-30 DIAGNOSIS — H35363 Drusen (degenerative) of macula, bilateral: Secondary | ICD-10-CM | POA: Diagnosis not present

## 2022-06-30 DIAGNOSIS — H35372 Puckering of macula, left eye: Secondary | ICD-10-CM | POA: Diagnosis not present

## 2022-06-30 DIAGNOSIS — H04123 Dry eye syndrome of bilateral lacrimal glands: Secondary | ICD-10-CM | POA: Diagnosis not present

## 2022-07-15 ENCOUNTER — Telehealth (INDEPENDENT_AMBULATORY_CARE_PROVIDER_SITE_OTHER): Payer: Self-pay | Admitting: Vascular Surgery

## 2022-07-15 NOTE — Telephone Encounter (Signed)
Patient states she woke up with a throbbing pain in her left leg below her knee toward the inner side of the leg.  Used bio tech compression but wants to know if it is ok to use.  Funny feeling on down towards the ankle, like a tingling.

## 2022-07-15 NOTE — Telephone Encounter (Signed)
I would note that she can use the compression socks, but I would call her PCP to further evaluate the pain

## 2022-07-28 DIAGNOSIS — L570 Actinic keratosis: Secondary | ICD-10-CM | POA: Diagnosis not present

## 2022-07-28 DIAGNOSIS — I890 Lymphedema, not elsewhere classified: Secondary | ICD-10-CM | POA: Diagnosis not present

## 2022-07-28 DIAGNOSIS — L219 Seborrheic dermatitis, unspecified: Secondary | ICD-10-CM | POA: Diagnosis not present

## 2022-08-10 ENCOUNTER — Ambulatory Visit: Payer: PPO | Admitting: Internal Medicine

## 2022-08-17 NOTE — Progress Notes (Unsigned)
Tomasita Morrow, NP-C Phone: [phone]  [name] is a 82 y.o. female who presents today for UTI symptoms.   UTI: Dysuria- Yes Frequency- Yes  Urgency- Yes  Hematuria- Yes  Fever- No Abd pain- No  Vaginal d/c- No  Patient reports right sided flank pain for 2 weeks. She reports noticing blood in her urine yesterday.   Social History   Tobacco Use  Smoking Status Never  Smokeless Tobacco Never    Current Outpatient Medications on File Prior to Visit  Medication Sig Dispense Refill   albuterol (PROAIR HFA) 108 (90 Base) MCG/ACT inhaler Inhale 1-2 puffs into the lungs every 6 (six) hours as needed for wheezing or shortness of breath. 18 g 11   aspirin 81 MG tablet TAKE 1 TABLET BY MOUTH EVERY DAY     atorvastatin (LIPITOR) 20 MG tablet Take 1 tablet (20 mg total) by mouth daily. 90 tablet 3   Calcium 600-200 MG-UNIT tablet Take 1 tablet daily by mouth.     Cholecalciferol (VITAMIN D3) 2000 UNITS capsule Take 2,000 Units by mouth daily.     citalopram (CELEXA) 20 MG tablet Take 1 tablet (20 mg total) by mouth daily. 90 tablet 3   colestipol (COLESTID) 1 g tablet Take 1 tablet (1 g total) by mouth 2 (two) times daily. Please schedule a follow up for further refills. 60 tablet 1   FOLIC ACID PO Take by mouth daily.     levothyroxine (SYNTHROID) 75 MCG tablet Take 1 tablet (75 mcg total) by mouth daily before breakfast. 90 tablet 3   olmesartan (BENICAR) 20 MG tablet Take 0.5 tablets (10 mg total) by mouth in the morning and at bedtime. D/c 5 mg (10 mg bid of 5 mg) 90 tablet 3   Omega-3 Fatty Acids (FISH OIL) 1000 MG CAPS Take 1,000 mg by mouth daily.      omeprazole (PRILOSEC) 20 MG capsule Take 1 capsule (20 mg total) by mouth 2 (two) times daily before a meal. 180 capsule 3   vitamin B-12 (CYANOCOBALAMIN) 1000 MCG tablet Take 2,000 mcg by mouth daily. 2 tabs     loperamide (IMODIUM A-D) 2 MG tablet Take 1 tablet (2 mg total) by mouth as needed for diarrhea or loose stools. (Patient  not taking: Reported on 08/18/2022) 30 tablet 0   triamcinolone cream (KENALOG) 0.1 % Apply 1 Application topically 2 (two) times daily. Prn legs (Patient not taking: Reported on 08/18/2022) 454 g 0   No current facility-administered medications on file prior to visit.     ROS see history of present illness  Objective  Physical Exam Vitals:   08/18/22 0850  BP: 122/60  Pulse: (!) 59  Temp: 97.8 F (36.6 C)  SpO2: 96%    BP Readings from Last 3 Encounters:  08/18/22 122/60  05/30/22 114/72  04/21/22 136/74   Wt Readings from Last 3 Encounters:  08/18/22 198 lb 6.4 oz (90 kg)  05/30/22 196 lb 3.2 oz (89 kg)  04/21/22 191 lb (86.6 kg)    Physical Exam Constitutional:      General: She is not in acute distress.    Appearance: Normal appearance.  HENT:     Head: Normocephalic.  Cardiovascular:     Rate and Rhythm: Normal rate and regular rhythm.     Heart sounds: Normal heart sounds.  Pulmonary:     Effort: Pulmonary effort is normal.     Breath sounds: Normal breath sounds.  Abdominal:     General:  Abdomen is flat. Bowel sounds are normal.     Palpations: Abdomen is soft.     Tenderness: There is right CVA tenderness.  Skin:    General: Skin is warm and dry.  Neurological:     General: No focal deficit present.     Mental Status: She is alert.  Psychiatric:        Mood and Affect: Mood normal.        Behavior: Behavior normal.    Assessment/Plan: Please see individual problem list.  Gross hematuria Assessment & Plan: Concern for renal stone due to gross hematuria and flank pain. CT stone protocol ordered, will contact patient with results. Microscopic UA and culture pending.   Orders: -     CT RENAL STONE STUDY; Future  Dysuria -     POCT Urinalysis Dipstick (Automated) -     Urine Culture -     Urinalysis, Routine w reflex microscopic   Return if symptoms worsen or fail to improve.   Tomasita Morrow, NP-C [place]

## 2022-08-18 ENCOUNTER — Other Ambulatory Visit: Payer: Self-pay | Admitting: Nurse Practitioner

## 2022-08-18 ENCOUNTER — Ambulatory Visit (INDEPENDENT_AMBULATORY_CARE_PROVIDER_SITE_OTHER): Payer: PPO | Admitting: Nurse Practitioner

## 2022-08-18 ENCOUNTER — Encounter: Payer: Self-pay | Admitting: Nurse Practitioner

## 2022-08-18 ENCOUNTER — Ambulatory Visit
Admission: RE | Admit: 2022-08-18 | Discharge: 2022-08-18 | Disposition: A | Discharge status: Home / Self Care | Payer: PPO | Source: Ambulatory Visit | Attending: Nurse Practitioner | Admitting: Nurse Practitioner

## 2022-08-18 VITALS — BP 122/60 | HR 59 | Temp 97.8°F | Ht 66.0 in | Wt 198.4 lb

## 2022-08-18 DIAGNOSIS — K5730 Diverticulosis of large intestine without perforation or abscess without bleeding: Secondary | ICD-10-CM | POA: Diagnosis not present

## 2022-08-18 DIAGNOSIS — R310 Gross hematuria: Secondary | ICD-10-CM | POA: Insufficient documentation

## 2022-08-18 DIAGNOSIS — R300 Dysuria: Secondary | ICD-10-CM | POA: Diagnosis not present

## 2022-08-18 DIAGNOSIS — K429 Umbilical hernia without obstruction or gangrene: Secondary | ICD-10-CM | POA: Diagnosis not present

## 2022-08-18 DIAGNOSIS — K449 Diaphragmatic hernia without obstruction or gangrene: Secondary | ICD-10-CM | POA: Diagnosis not present

## 2022-08-18 DIAGNOSIS — N3001 Acute cystitis with hematuria: Secondary | ICD-10-CM

## 2022-08-18 HISTORY — DX: Gross hematuria: R31.0

## 2022-08-18 LAB — URINALYSIS, ROUTINE W REFLEX MICROSCOPIC
Ketones, ur: NEGATIVE
Nitrite: NEGATIVE
Specific Gravity, Urine: >=1.03 — AB (ref 1.000–1.030)
Total Protein, Urine: 100 — AB
Urine Glucose: NEGATIVE
Urobilinogen, UA: 0.2 (ref 0.0–1.0)
pH: 5.5 (ref 5.0–8.0)

## 2022-08-18 LAB — POC URINALSYSI DIPSTICK (AUTOMATED)
Glucose, UA: NEGATIVE
Ketones, UA: NEGATIVE
Nitrite, UA: NEGATIVE
Protein, UA: POSITIVE — AB
Spec Grav, UA: >=1.03 — AB (ref 1.010–1.025)
Urobilinogen, UA: 0.2 E.U./dL
pH, UA: 5.5 (ref 5.0–8.0)

## 2022-08-18 MED ORDER — NITROFURANTOIN MONOHYD MACRO 100 MG PO CAPS: 100.0000 mg | ORAL_CAPSULE | Freq: Two times a day (BID) | ORAL | 0 refills | Status: DC

## 2022-08-18 NOTE — Assessment & Plan Note (Addendum)
Concern for renal stone due to gross hematuria and flank pain. CT stone protocol ordered, will contact patient with results. Microscopic UA and culture pending.

## 2022-08-19 ENCOUNTER — Telehealth: Payer: Self-pay

## 2022-08-19 NOTE — Telephone Encounter (Signed)
If pt Calls back tell her it was a misunderstanding and that Per Ollen Gross she already discussed the CT scan with her already.

## 2022-08-19 NOTE — Telephone Encounter (Signed)
LMOM for pt to CB in regards to her CT scan, just checking to see if when she spoke with Tomasita Morrow, NP that she also went over the CT scan.

## 2022-08-20 LAB — URINE CULTURE
MICRO NUMBER:: 14505737
SPECIMEN QUALITY:: ADEQUATE

## 2022-08-22 ENCOUNTER — Telehealth: Payer: Self-pay

## 2022-08-22 ENCOUNTER — Other Ambulatory Visit: Payer: Self-pay | Admitting: Nurse Practitioner

## 2022-08-22 ENCOUNTER — Encounter: Payer: Self-pay | Admitting: Family Medicine

## 2022-08-22 ENCOUNTER — Ambulatory Visit (INDEPENDENT_AMBULATORY_CARE_PROVIDER_SITE_OTHER): Payer: PPO | Admitting: Family Medicine

## 2022-08-22 VITALS — BP 134/78 | HR 65 | Temp 97.4°F | Ht 66.0 in | Wt 194.6 lb

## 2022-08-22 DIAGNOSIS — K219 Gastro-esophageal reflux disease without esophagitis: Secondary | ICD-10-CM

## 2022-08-22 DIAGNOSIS — E785 Hyperlipidemia, unspecified: Secondary | ICD-10-CM | POA: Diagnosis not present

## 2022-08-22 DIAGNOSIS — R7303 Prediabetes: Secondary | ICD-10-CM

## 2022-08-22 DIAGNOSIS — K5790 Diverticulosis of intestine, part unspecified, without perforation or abscess without bleeding: Secondary | ICD-10-CM

## 2022-08-22 DIAGNOSIS — K760 Fatty (change of) liver, not elsewhere classified: Secondary | ICD-10-CM

## 2022-08-22 DIAGNOSIS — N3281 Overactive bladder: Secondary | ICD-10-CM

## 2022-08-22 DIAGNOSIS — R310 Gross hematuria: Secondary | ICD-10-CM | POA: Diagnosis not present

## 2022-08-22 DIAGNOSIS — K580 Irritable bowel syndrome with diarrhea: Secondary | ICD-10-CM

## 2022-08-22 DIAGNOSIS — E038 Other specified hypothyroidism: Secondary | ICD-10-CM | POA: Diagnosis not present

## 2022-08-22 DIAGNOSIS — I700 Atherosclerosis of aorta: Secondary | ICD-10-CM | POA: Diagnosis not present

## 2022-08-22 DIAGNOSIS — I6523 Occlusion and stenosis of bilateral carotid arteries: Secondary | ICD-10-CM

## 2022-08-22 DIAGNOSIS — E669 Obesity, unspecified: Secondary | ICD-10-CM

## 2022-08-22 DIAGNOSIS — G3184 Mild cognitive impairment, so stated: Secondary | ICD-10-CM

## 2022-08-22 DIAGNOSIS — M26609 Unspecified temporomandibular joint disorder, unspecified side: Secondary | ICD-10-CM

## 2022-08-22 DIAGNOSIS — N3001 Acute cystitis with hematuria: Secondary | ICD-10-CM

## 2022-08-22 DIAGNOSIS — I10 Essential (primary) hypertension: Secondary | ICD-10-CM | POA: Diagnosis not present

## 2022-08-22 DIAGNOSIS — E559 Vitamin D deficiency, unspecified: Secondary | ICD-10-CM

## 2022-08-22 MED ORDER — SULFAMETHOXAZOLE-TRIMETHOPRIM 800-160 MG PO TABS: 1.0000 | ORAL_TABLET | Freq: Two times a day (BID) | ORAL | 0 refills | Status: DC

## 2022-08-22 MED ORDER — SACCHAROMYCES BOULARDII 250 MG PO CAPS: 250.0000 mg | ORAL_CAPSULE | Freq: Every day | ORAL | 0 refills | Status: DC

## 2022-08-22 NOTE — Progress Notes (Unsigned)
   SUBJECTIVE:  No chief complaint on file.  HPI Patient presents to clinic to transfer care.  Hypertension   PERTINENT PMH / PSH: Hypertension GERD Hypothyroidism IBS Diverticulosis Arthritis Mood disorder Lymphedema   OBJECTIVE:  There were no vitals taken for this visit.   Physical Exam Vitals reviewed.  Constitutional:      General: She is not in acute distress.    Appearance: She is normal weight. She is not ill-appearing.  HENT:     Head: Normocephalic.  Eyes:     Conjunctiva/sclera: Conjunctivae normal.  Cardiovascular:     Rate and Rhythm: Normal rate and regular rhythm.     Pulses: Normal pulses.  Pulmonary:     Effort: Pulmonary effort is normal.     Breath sounds: Normal breath sounds.  Abdominal:     General: Bowel sounds are normal.     Palpations: Abdomen is soft.     Tenderness: There is abdominal tenderness. There is right CVA tenderness. There is no left CVA tenderness, guarding or rebound.  Neurological:     Mental Status: She is alert. Mental status is at baseline.  Psychiatric:        Mood and Affect: Mood normal.        Behavior: Behavior normal.        Thought Content: Thought content normal.        Judgment: Judgment normal.     ASSESSMENT/PLAN:  Essential hypertension  Other specified hypothyroidism  Hyperlipidemia, unspecified hyperlipidemia type  MCI (mild cognitive impairment)  Overactive bladder  TMJ dysfunction  Diverticulosis  Irritable bowel syndrome with diarrhea  Gastroesophageal reflux disease, unspecified whether esophagitis present  Fatty liver  Bilateral carotid artery stenosis  Prediabetes  Vitamin D deficiency  Obesity (BMI 30-39.9)   PDMP reviewed***  No follow-ups on file.  Carollee Leitz, MD

## 2022-08-22 NOTE — Telephone Encounter (Signed)
Left a detailed msg on pts VM in regards to her Urine culture and what Tomasita Morrow, NP advised. :   Tomasita Morrow, NP  Gracy Racer, [place] Will you let her know I am going to change her antibiotic to something that is more sensitive to the bacteria that grew in her urine.

## 2022-08-22 NOTE — Patient Instructions (Addendum)
It was a pleasure meeting you today. Thank you for allowing me to take part in your health care.  Our goals for today as we discussed include:  Start Bactrim as ordered by the nurse practitioner  Will also need to start probiotics daily while on antibiotics as well as continue this for least 2 weeks after antibiotic treatment is completed.  We will get some labs today.  If they are abnormal or we need to do something about them, I will call you.  If they are normal, I will send you a message on MyChart (if it is active) or a letter in the mail.  If you don't hear from [name] in 2 weeks, please call the office at the number below.   Follow-up in 1 weeks  If you have any questions or concerns, please do not hesitate to call the office at [phone].  I look forward to our next visit and until then take care and stay safe.  Regards,   Carollee Leitz, MD   [facility]

## 2022-08-23 ENCOUNTER — Telehealth: Payer: Self-pay | Admitting: Gastroenterology

## 2022-08-23 ENCOUNTER — Encounter: Payer: Self-pay | Admitting: Family Medicine

## 2022-08-23 LAB — COMPREHENSIVE METABOLIC PANEL
ALT: 14 U/L (ref 0–35)
AST: 17 U/L (ref 0–37)
Albumin: 4.3 g/dL (ref 3.5–5.2)
Alkaline Phosphatase: 76 U/L (ref 39–117)
BUN: 16 mg/dL (ref 6–23)
CO2: 24 mEq/L (ref 19–32)
Calcium: 9 mg/dL (ref 8.4–10.5)
Chloride: 103 mEq/L (ref 96–112)
Creatinine, Ser: 1.06 mg/dL (ref 0.40–1.20)
GFR: 49.2 mL/min — ABNORMAL LOW (ref >60.00)
Glucose, Bld: 78 mg/dL (ref 70–99)
Potassium: 4.5 mEq/L (ref 3.5–5.1)
Sodium: 139 mEq/L (ref 135–145)
Total Bilirubin: 1.2 mg/dL (ref 0.2–1.2)
Total Protein: 6.8 g/dL (ref 6.0–8.3)

## 2022-08-23 LAB — CBC WITH DIFFERENTIAL/PLATELET
Basophils Absolute: 0 10*3/uL (ref 0.0–0.1)
Basophils Relative: 0.5 % (ref 0.0–3.0)
Eosinophils Absolute: 0.2 10*3/uL (ref 0.0–0.7)
Eosinophils Relative: 3 % (ref 0.0–5.0)
HCT: 38.6 % (ref 36.0–46.0)
Hemoglobin: 12.9 g/dL (ref 12.0–15.0)
Lymphocytes Relative: 26.3 % (ref 12.0–46.0)
Lymphs Abs: 1.5 10*3/uL (ref 0.7–4.0)
MCHC: 33.5 g/dL (ref 30.0–36.0)
MCV: 88.9 fl (ref 78.0–100.0)
Monocytes Absolute: 0.6 10*3/uL (ref 0.1–1.0)
Monocytes Relative: 10.1 % (ref 3.0–12.0)
Neutro Abs: 3.5 10*3/uL (ref 1.4–7.7)
Neutrophils Relative %: 60.1 % (ref 43.0–77.0)
Platelets: 216 10*3/uL (ref 150.0–400.0)
RBC: 4.35 Mil/uL (ref 3.87–5.11)
RDW: 14 % (ref 11.5–15.5)
WBC: 5.9 10*3/uL (ref 4.0–10.5)

## 2022-08-23 LAB — LDL CHOLESTEROL, DIRECT: Direct LDL: 67 mg/dL

## 2022-08-23 LAB — LIPID PANEL
Cholesterol: 168 mg/dL (ref 0–200)
HDL: 76.4 mg/dL (ref >39.00)
NonHDL: 91.94
Total CHOL/HDL Ratio: 2
Triglycerides: 226 mg/dL — ABNORMAL HIGH (ref 0.0–149.0)
VLDL: 45.2 mg/dL — ABNORMAL HIGH (ref 0.0–40.0)

## 2022-08-23 LAB — HEMOGLOBIN A1C: Hgb A1c MFr Bld: 6.3 % (ref 4.6–6.5)

## 2022-08-23 LAB — VITAMIN B12: Vitamin B-12: 870 pg/mL (ref 211–911)

## 2022-08-23 LAB — VITAMIN D 25 HYDROXY (VIT D DEFICIENCY, FRACTURES): VITD: 31.74 ng/mL (ref 30.00–100.00)

## 2022-08-23 LAB — TSH: TSH: 2.91 u[IU]/mL (ref 0.35–5.50)

## 2022-08-23 MED ORDER — COLESTIPOL HCL 1 G PO TABS: 1.0000 g | ORAL_TABLET | Freq: Two times a day (BID) | ORAL | 1 refills | Status: DC

## 2022-08-23 NOTE — Assessment & Plan Note (Signed)
Chronic.  Stable.  Per JNC 8 guideline BP at goal for age greater than 14. Continue Benicar 10 mg twice daily CMET today

## 2022-08-23 NOTE — Telephone Encounter (Signed)
Returned call to patient. Pt states that her PCP reminded her that she is due for a follow up with Dr. Havery Moros before she can get any additional refills of Colestipol. Pt has been scheduled for a f/u with Dr. Havery Moros on Thursday, 11/03/22 at 10:10 am. Pt is aware that we will send in enough refills to last until her f/u appt. Pt verbalized understanding and had no concerns at the end of the call.

## 2022-08-23 NOTE — Assessment & Plan Note (Addendum)
Resolved.  Likely secondary to UTI.  Recent CT renal study negative for renal stones, hydronephrosis. Initially treated with nitrofurantoin x 5 days.  Recently switched to Bactrim daily x 7 days. Recommend probiotics daily and continue until completion of antibiotics. Follow-up with PCP if symptoms worsen.

## 2022-08-23 NOTE — Assessment & Plan Note (Signed)
Chronic.  Stable.  Asymptomatic. Recent A1c 6.4 Recheck A1c

## 2022-08-23 NOTE — Assessment & Plan Note (Signed)
Chronic.  Stable.  Tolerating current medication. Continue levothyroxine 75 mcg daily.   Check TSH.

## 2022-08-23 NOTE — Assessment & Plan Note (Signed)
Chronic.  Stable. On statin therapy and tolerating well.  No myalgias. Continue Lipitor 20 mg daily

## 2022-08-23 NOTE — Telephone Encounter (Signed)
Patient is calling having questions regarding medication and if she needs to come in for a follow up or not. Please advise

## 2022-08-31 ENCOUNTER — Ambulatory Visit: Payer: PPO | Admitting: Family Medicine

## 2022-09-01 ENCOUNTER — Encounter: Payer: Self-pay | Admitting: Family Medicine

## 2022-09-01 ENCOUNTER — Ambulatory Visit: Payer: PPO | Admitting: Family Medicine

## 2022-09-01 ENCOUNTER — Telehealth: Payer: Self-pay

## 2022-09-01 NOTE — Telephone Encounter (Signed)
Pt called in reporting to the front desk that she thought she was having a stoke and wanted an appointment. I talked to pt and she reported that she was having headaches on and off from her head to 1/2 way down her head and was concerned because she has had a CVA before.Shortness of breath. I ask if anymore symptoms and she reported no. I encouraged her to go to the ED if she was concerned about her health.  She said she would like to wait until Monday to see Doctor Posey Pronto. I still encouraged her to go to ED if she thought she needed to before then. Front desk made appointment for Monday.

## 2022-09-01 NOTE — Telephone Encounter (Signed)
Patient called in stating she wanted to schedule an appointment when asking what the appointment would be for, she said she was having R sided "skull pain" at the back of her head that went to neck and was having R hand numbness. Jhanvi said that she sees Dr.Patel for this issue but said over the last week symptoms have worsened. Had patient speak with [facility] for triage and patient is scheduled for Monday.

## 2022-09-02 ENCOUNTER — Telehealth: Payer: Self-pay

## 2022-09-02 NOTE — Telephone Encounter (Signed)
Called to just check on pt and left message to call if she needed [name] , but just wanted to check on her.

## 2022-09-02 NOTE — Telephone Encounter (Signed)
Reference other phone note.    Encounter Date: 09/01/2022   Signed      Pt called in reporting to the front desk that she thought she was having a stoke and wanted an appointment. I talked to pt and she reported that she was having headaches on and off from her head to 1/2 way down her head and was concerned because she has had a CVA before.Shortness of breath. I ask if anymore symptoms and she reported no. I encouraged her to go to the ED if she was concerned about her health.  She said she would like to wait until Monday to see Doctor Posey Pronto. I still encouraged her to go to ED if she thought she needed to before then. Front desk made appointment for Monday.        Electronically signed by Renae Gloss, LPN at [phone]  [id number] PM

## 2022-09-05 ENCOUNTER — Encounter: Payer: Self-pay | Admitting: Neurology

## 2022-09-05 ENCOUNTER — Ambulatory Visit: Payer: PPO | Admitting: Neurology

## 2022-09-05 VITALS — BP 118/63 | HR 77 | Ht 66.0 in | Wt 194.0 lb

## 2022-09-05 DIAGNOSIS — G629 Polyneuropathy, unspecified: Secondary | ICD-10-CM | POA: Diagnosis not present

## 2022-09-05 DIAGNOSIS — M5481 Occipital neuralgia: Secondary | ICD-10-CM | POA: Diagnosis not present

## 2022-09-05 MED ORDER — TIZANIDINE HCL 2 MG PO TABS: 2.0000 mg | ORAL_TABLET | Freq: Every evening | ORAL | 2 refills | Status: DC | PRN

## 2022-09-05 NOTE — Patient Instructions (Signed)
You may take tizanidine as needed for your neck pain.

## 2022-09-05 NOTE — Progress Notes (Signed)
Follow-up Visit   Date: 09/05/22    [name] MRN: [id number] DOB: 1941/03/10   Interim History: [name] is a 82 y.o. left-handed Caucasian female with GERD, hypothyroidism, stage IA malignant melanoma of the back (2012) s/p excision, idiopathic neuropathy, vestibular neuritis (followed by ENT) and GERD returning to the clinic with complaints of right sided neck pain.  The patient was accompanied to the clinic by self.  IMPRESSION/PLAN:  Occipital neuralgia affecting the right side  - Start tizanidine 64m at bedtime as needed  - OK to use tylenol PRN pain  Right hand numbness, likely carpal tunnel syndrome  - NCS/EMG, if symptoms get worse  Idiopathic peripheral neuropathy, diagnosed 1990s, manifesting predominately with paresthesias following a glove-stocking distribution, stable.  Return to clinic as needed  ---------------------------------------------------------------- She complains of pain at the base of the right side of the neck which radiates up her scalp.  Pain is achy and constant x 2 weeks.  She has tenderness when she lays on her pillow.  She takes extra strength tylenol 5021min the afternoon or evening which helps.    She has numbness in the right hand, which is worse upon awakening in the morning.    She has mild tremor in the left hand which has improved since switching zoloft to celexa.     Medications:  Current Outpatient Medications on File Prior to Visit  Medication Sig Dispense Refill   albuterol (PROAIR HFA) 108 (90 Base) MCG/ACT inhaler Inhale 1-2 puffs into the lungs every 6 (six) hours as needed for wheezing or shortness of breath. 18 g 11   aspirin 81 MG tablet TAKE 1 TABLET BY MOUTH EVERY DAY     atorvastatin (LIPITOR) 20 MG tablet Take 1 tablet (20 mg total) by mouth daily. 90 tablet 3   Biotin 1000 MCG tablet Take 1,000 mcg by mouth daily.     Calcium 600-200 MG-UNIT tablet Take 1 tablet daily by mouth.     Cholecalciferol  (VITAMIN D3) 2000 UNITS capsule Take 2,000 Units by mouth daily.     citalopram (CELEXA) 20 MG tablet Take 1 tablet (20 mg total) by mouth daily. 90 tablet 3   colestipol (COLESTID) 1 g tablet Take 1 tablet (1 g total) by mouth 2 (two) times daily. Please schedule a follow up for further refills. 60 tablet 1   FOLIC ACID PO Take by mouth daily.     levothyroxine (SYNTHROID) 75 MCG tablet Take 1 tablet (75 mcg total) by mouth daily before breakfast. 90 tablet 3   Magnesium 250 MG TABS Take by mouth.     olmesartan (BENICAR) 20 MG tablet Take 0.5 tablets (10 mg total) by mouth in the morning and at bedtime. D/c 5 mg (10 mg bid of 5 mg) 90 tablet 3   Omega-3 Fatty Acids (FISH OIL) 1000 MG CAPS Take 1,000 mg by mouth daily.      omeprazole (PRILOSEC) 20 MG capsule Take 1 capsule (20 mg total) by mouth 2 (two) times daily before a meal. 180 capsule 3   Potassium (POTASSIMIN PO) Take by mouth.     vitamin B-12 (CYANOCOBALAMIN) 1000 MCG tablet Take 2,000 mcg by mouth daily. 2 tabs     saccharomyces boulardii (FLORASTOR) 250 MG capsule Take 1 capsule (250 mg total) by mouth daily. (Patient not taking: Reported on 09/05/2022) 90 capsule 0   sulfamethoxazole-trimethoprim (BACTRIM DS) 800-160 MG tablet Take 1 tablet by mouth 2 (two) times daily. (Patient not taking:  Reported on 09/05/2022) 14 tablet 0   triamcinolone cream (KENALOG) 0.1 % Apply 1 Application topically 2 (two) times daily. Prn legs 454 g 0   No current facility-administered medications on file prior to visit.    Allergies:  Allergies  Allergen Reactions   Epinephrine Other (See Comments) and Anaphylaxis    Heart palpitations   Penicillins Swelling and Other (See Comments)    Lip swelling Has patient had a PCN reaction causing immediate rash, facial/tongue/throat swelling, SOB or lightheadedness with hypotension: Yes Has patient had a PCN reaction causing severe rash involving mucus membranes or skin necrosis: No Has patient had a PCN  reaction that required hospitalization No Has patient had a PCN reaction occurring within the last 10 years: No If all of the above answers are "NO", then may proceed with Cephalosporin use.   Lidocaine Hcl Other (See Comments)   Other     Black pepper- caused bronchial tubes to start closing    Valium [Diazepam] Other (See Comments)    Other reaction(s): Other (See Comments) Overly sensitive  sedation     Vital Signs:  BP 118/63   Pulse 77   Ht 5' 6"$  (1.676 m)   Wt 194 lb (88 kg)   SpO2 96%   BMI 31.31 kg/m   Neurological Exam: MENTAL STATUS including orientation to time, place, person, recent and remote memory, attention span and concentration, language, and fund of knowledge is normal.  Speech is not dysarthric.  CRANIAL NERVES:  Normal conjugate, extra-ocular eye movements in all directions of gaze.  No ptosis.  Face is symmetric.  Palate elevates symmetrically.  Tongue is midline.   MOTOR:  Motor strength is 5/5 in all extremities.  She has tenderness over the right GON and LON.  No atrophy, fasciculations or abnormal movements.  No pronator drift.  Tone is normal.    MSRs:  Reflexes are 2+/4 throughout, except absent Achilles.  SENSORY:  Vibration diminished below the knees  COORDINATION/GAIT:  Normal finger-to- nose-finger.  Intact rapid alternating movements bilaterally.  Gait narrow based and stable, assisted with cane.    Data: Peripheral vascular studies 02/13/2015:  Normal ABI  MRI brain and cervical spine 11/10/2014:  No acute infarct.  Remote right frontal lobe infarct with encephalomalacia. Prominent small vessel disease type changes representing significant progression since prior exam. No intracranial mass lesion noted on this unenhanced exam. Cervical spondylotic changes upper cervical spine most prominent C4-5 level.  MRI brain 08/07/2015: 1. No intracranial mass lesion or abnormal enhancement. 2. Previously noted indeterminate 3 mm lesion at the tip of  the dens demonstrates homogeneous postcontrast enhancement. Given the location and morphology of this lesion, this is favored to be degenerative in in nature. However, given the history of melanoma, a short interval follow-up study in 3 months is recommended to document stability to ensure no underlying metastatic lesion is present.  MRI brain wwo contrast 10/19/2015: Negative for metastatic disease to the brain. No acute infarct. Moderate to advanced chronic microvascular ischemia. Large joint effusion of the right TMJ. This is unchanged and may be related to degenerative change or rheumatoid arthritis 3 mm enhancing lesion the dens is stable and most likely related to arthritis. Question rheumatoid arthritis Negative for metastatic disease to the brain.  NCS/EMG of the upper extremities 01/05/2016: Chronic C6, C7, and C8 radiculopathy affecting right upper extremity, mild in degree electrically. Chronic C8 radiculopathy affecting the left upper extremity, mild in degree electrically. Incidentally, there is a Martin-Gruber anastomosis bilaterally,  a normal variant.  US carotids 05/12/2016:  Less than 50% stenosis in the right and left internal carotid arteries.  Neuropsychiatry testing 03/13/2018:  Major depressive disorder, moderate. Rule out generalized anxiety disorder.  CT head wo contrast 08/27/2020: 1. No evidence of acute intracranial abnormality. 2. Remote right frontal infarct and chronic microvascular ischemic change.  MRI brain 02/24/2021: 1. No evidence of acute intracranial abnormality. 2. Remote right frontal lobe infarct. 3. Progressive moderate to advanced chronic microvascular ischemic disease.    Thank you for allowing me to participate in patient's care.  If I can answer any additional questions, I would be pleased to do so.    Sincerely,    Sakia Schrimpf K. Posey Pronto, DO

## 2022-09-09 ENCOUNTER — Telehealth: Payer: Self-pay

## 2022-09-09 NOTE — Telephone Encounter (Signed)
Pt called back regarding previous message. As per pt, she wants a call from Dr. Volanda Napoleon assistant regarding this matter, she's available [phone].

## 2022-09-09 NOTE — Telephone Encounter (Signed)
Dr. Carollee Leitz was not in on 09/01/2022 for patient's 1-week follow-up, so we cancelled the appointment and asked patient to call us back to reschedule.  Patient is calling us back to reschedule, but Dr. Loistine Chance schedule is full for the near future.  Please let us know how to schedule.

## 2022-09-09 NOTE — Telephone Encounter (Signed)
Patient states she is still hurting in her lower rib area on her right side and it runs toward her back.  Patient states Dr. Carollee Leitz has already seen her for this issue.  Patient states she is wondering if this is related to her UTI or her kidneys.  Patient states she is still going to the bathroom a lot.  Patient states she is still taking her probiotic and eating her yogurt.

## 2022-09-09 NOTE — Telephone Encounter (Signed)
I scheduled the patien tto be seen by provider on Wednesday 09/14/2022.  Lajuanna Pompa,cma

## 2022-09-12 ENCOUNTER — Other Ambulatory Visit (INDEPENDENT_AMBULATORY_CARE_PROVIDER_SITE_OTHER): Payer: Self-pay | Admitting: Nurse Practitioner

## 2022-09-12 ENCOUNTER — Telehealth (INDEPENDENT_AMBULATORY_CARE_PROVIDER_SITE_OTHER): Payer: Self-pay | Admitting: Vascular Surgery

## 2022-09-12 DIAGNOSIS — M79605 Pain in left leg: Secondary | ICD-10-CM

## 2022-09-12 DIAGNOSIS — L539 Erythematous condition, unspecified: Secondary | ICD-10-CM

## 2022-09-12 NOTE — Telephone Encounter (Signed)
She can come in with bilateral venous reflux to see me or dew

## 2022-09-12 NOTE — Telephone Encounter (Signed)
Patient called and stated left leg is real sore to touch and it is red on one day and the next day it's not.  She states she also have some redness in right leg. But states the left is the worst one.  Please advise.

## 2022-09-14 ENCOUNTER — Encounter: Payer: Self-pay | Admitting: Family Medicine

## 2022-09-14 ENCOUNTER — Ambulatory Visit (INDEPENDENT_AMBULATORY_CARE_PROVIDER_SITE_OTHER): Payer: PPO | Admitting: Family Medicine

## 2022-09-14 VITALS — BP 100/60 | HR 66 | Temp 98.3°F | Ht 66.0 in | Wt 195.6 lb

## 2022-09-14 DIAGNOSIS — R350 Frequency of micturition: Secondary | ICD-10-CM

## 2022-09-14 DIAGNOSIS — N3001 Acute cystitis with hematuria: Secondary | ICD-10-CM | POA: Diagnosis not present

## 2022-09-14 LAB — POCT URINALYSIS DIPSTICK
Bilirubin, UA: NEGATIVE
Glucose, UA: NEGATIVE
Ketones, UA: NEGATIVE
Nitrite, UA: NEGATIVE
Protein, UA: NEGATIVE
Spec Grav, UA: 1.02 (ref 1.010–1.025)
Urobilinogen, UA: 0.2 E.U./dL
pH, UA: 5 (ref 5.0–8.0)

## 2022-09-14 MED ORDER — CEFDINIR 300 MG PO CAPS: 300.0000 mg | ORAL_CAPSULE | Freq: Two times a day (BID) | ORAL | 0 refills | Status: AC

## 2022-09-14 NOTE — Patient Instructions (Signed)
It was a pleasure meeting you today. Thank you for allowing me to take part in your health care.  Our goals for today as we discussed include:  Start Cefdinir 300 mg two times a day Continue Probiotics daily Will send urine for culture so may need to switch antibiotics or change in a day or two.  Sometimes will need to stop if no growth.  Continue to hydrate Ca drink cranberry juice  If confirmed urine infection will repeat urine 3 weeks after completion of antibiotics  You received Ancef on 11/2010 so not likely to be allergic to Cefdinir.   If you have any questions or concerns, please do not hesitate to call the office at [phone].  I look forward to our next visit and until then take care and stay safe.  Regards,   Carollee Leitz, MD   [facility]

## 2022-09-14 NOTE — Progress Notes (Signed)
   SUBJECTIVE:   Chief Complaint  Patient presents with   Acute Visit    Rib pain/ urine frequency   HPI Concern for urinary urgency. Symptoms started few days ago.  Initially had right-sided pain but this is since resolved. Endorses suprapubic pain intermittently. Denies any fevers, nausea/vomiting, hematuria, dysuria or back pain.  Seen in clinic on 02/01 treated with Macrobid.  Sensitivities returned from and was switched to  5-day course of Bactrim.  She reports symptoms improved with antibiotics but not long after patient symptoms returned.  PERTINENT PMH / PSH: Recurrent UTI Overactive bladder  OBJECTIVE:  BP 100/60   Pulse 66   Temp 98.3 F (36.8 C) (Oral)   Ht '5\' 6"'$  (1.676 m)   Wt 195 lb 9.6 oz (88.7 kg)   SpO2 92%   BMI 31.57 kg/m    Physical Exam Constitutional:      General: She is not in acute distress.    Appearance: She is normal weight. She is not ill-appearing.  HENT:     Head: Normocephalic.     Mouth/Throat:     Mouth: Mucous membranes are moist.  Eyes:     Conjunctiva/sclera: Conjunctivae normal.  Cardiovascular:     Rate and Rhythm: Normal rate.  Pulmonary:     Effort: Pulmonary effort is normal.  Abdominal:     General: Bowel sounds are normal.     Palpations: Abdomen is soft.     Tenderness: There is abdominal tenderness. There is no right CVA tenderness or left CVA tenderness.  Neurological:     Mental Status: She is alert. Mental status is at baseline.  Psychiatric:        Mood and Affect: Mood normal.        Behavior: Behavior normal.        Thought Content: Thought content normal.        Judgment: Judgment normal.     ASSESSMENT/PLAN:  Acute cystitis with hematuria Assessment & Plan: Acute on chronic.  Did not completely resolve her previous UTI.  Recent CT renal study negative for renal stones or hydronephrosis POC urine positive for moderate amount of leukocytes, trace blood Will repeat urine for culture Reviewed allergies,  patient received Ancef in 2015 with no reaction. Cefdinir 300 mg twice daily x 7 days Probiotics daily and continue until completion of treatment Increase fluid intake Plan to repeat urine culture 3-4 weeks after completion of antibiotics to ensure resolution  Orders: -     POCT urinalysis dipstick -     Urine Culture -     Cefdinir; Take 1 capsule (300 mg total) by mouth 2 (two) times daily for 7 days.  Dispense: 14 capsule; Refill: 0 -     Urine Culture; Future   PDMP reviewed  Return if symptoms worsen or fail to improve.  Carollee Leitz, MD

## 2022-09-16 ENCOUNTER — Encounter (INDEPENDENT_AMBULATORY_CARE_PROVIDER_SITE_OTHER): Payer: Self-pay | Admitting: Nurse Practitioner

## 2022-09-16 ENCOUNTER — Ambulatory Visit (INDEPENDENT_AMBULATORY_CARE_PROVIDER_SITE_OTHER): Payer: PPO | Admitting: Nurse Practitioner

## 2022-09-16 ENCOUNTER — Ambulatory Visit (INDEPENDENT_AMBULATORY_CARE_PROVIDER_SITE_OTHER): Payer: PPO

## 2022-09-16 DIAGNOSIS — L539 Erythematous condition, unspecified: Secondary | ICD-10-CM

## 2022-09-16 DIAGNOSIS — M79605 Pain in left leg: Secondary | ICD-10-CM

## 2022-09-16 LAB — URINE CULTURE
MICRO NUMBER:: 14626634
SPECIMEN QUALITY:: ADEQUATE

## 2022-09-17 ENCOUNTER — Encounter: Payer: Self-pay | Admitting: Family Medicine

## 2022-09-17 DIAGNOSIS — N3001 Acute cystitis with hematuria: Secondary | ICD-10-CM | POA: Insufficient documentation

## 2022-09-17 NOTE — Assessment & Plan Note (Signed)
Acute on chronic.  Did not completely resolve her previous UTI.  Recent CT renal study negative for renal stones or hydronephrosis POC urine positive for moderate amount of leukocytes, trace blood Will repeat urine for culture Reviewed allergies, patient received Ancef in 2015 with no reaction. Cefdinir 300 mg twice daily x 7 days Probiotics daily and continue until completion of treatment Increase fluid intake Plan to repeat urine culture 3-4 weeks after completion of antibiotics to ensure resolution

## 2022-09-19 ENCOUNTER — Telehealth: Payer: Self-pay | Admitting: Family Medicine

## 2022-09-19 NOTE — Telephone Encounter (Signed)
Patient called back because she hadnt heard about her recent lab results, She said she wasn't able to get into mychart. Message below:   Urine infection with same bacteria.  Continue current management as we had discussed in clinic.  Will repeat 3 weeks after completion of antibiotics.    I read Dr Loistine Chance message to her and she didn't have any questions. She did schedule lab only visit in 3 weeks

## 2022-10-03 ENCOUNTER — Encounter: Payer: Self-pay | Admitting: Pulmonary Disease

## 2022-10-03 ENCOUNTER — Ambulatory Visit (INDEPENDENT_AMBULATORY_CARE_PROVIDER_SITE_OTHER): Payer: PPO | Admitting: Pulmonary Disease

## 2022-10-03 VITALS — BP 118/58 | HR 66 | Temp 97.8°F | Ht 66.0 in | Wt 197.0 lb

## 2022-10-03 DIAGNOSIS — J683 Other acute and subacute respiratory conditions due to chemicals, gases, fumes and vapors: Secondary | ICD-10-CM

## 2022-10-03 DIAGNOSIS — K449 Diaphragmatic hernia without obstruction or gangrene: Secondary | ICD-10-CM

## 2022-10-03 DIAGNOSIS — K219 Gastro-esophageal reflux disease without esophagitis: Secondary | ICD-10-CM | POA: Diagnosis not present

## 2022-10-03 DIAGNOSIS — R0602 Shortness of breath: Secondary | ICD-10-CM | POA: Diagnosis not present

## 2022-10-03 LAB — NITRIC OXIDE: Nitric Oxide: 28

## 2022-10-03 MED ORDER — BREO ELLIPTA 100-25 MCG/ACT IN AEPB: 1.0000 | INHALATION_SPRAY | Freq: Every day | RESPIRATORY_TRACT | 11 refills | Status: DC

## 2022-10-03 NOTE — Patient Instructions (Addendum)
We are giving you a trial of an inhaler called Breo this will be 1 puff daily.  Make sure you rinse your mouth well after you use it.  You can still use your albuterol as needed.  We are repeating breathing test to make sure your lung function is stable.  We will see you in follow-up in 2 to 3 months time call sooner should any new problems arise.

## 2022-10-03 NOTE — Progress Notes (Signed)
Subjective:    Patient ID: [name], female    DOB: 1940-12-05, 82 y.o.   MRN: [id number] Patient Care Team: Carollee Leitz, MD as PCP - General (Family Medicine) Patient, No Pcp Per (General Practice) Alda Berthold, DO as Consulting Physician (Neurology)  Chief Complaint  Patient presents with   Follow-up    SOB with exertion. Some wheezing. Dry cough.    HPI Patient is an 82 year old lifelong never smoker who follows here for the issue of recurrent cough. We last saw her on 16 December 2021.  She initially noted cough around December 2019.  He had COVID-02 February 2021 and noticed that she had issues with dyspnea post-COVID.  At her prior visit she noted that her dyspnea was better however today she notes that she has had issues with dyspnea on exertion.  Her cough is mainly driven by gastroesophageal reflux.  She has been noted to have mild reactive airways dysfunction syndrome.  Adderall on an as-needed basis. She has not had any fevers, chills or sweats.  Cough for the most part is nonproductive when she does have some sputum production it is usually scant and white. Gastroesophageal reflux symptoms controlled with PPI. No other complaint noted.    Review of Systems A 10 point review of systems was performed and it is as noted above otherwise negative.  Patient Active Problem List   Diagnosis Date Noted   Acute cystitis with hematuria 09/17/2022   Vitamin D deficiency 10/12/2021   TMJ dysfunction 03/04/2021   Anxiety and depression 01/12/2021   Insomnia 01/12/2021   Fatigue 01/12/2021   Varicose veins of leg with pain, bilateral 10/06/2020   Cerebrovascular accident (CVA) ([place]) 09/22/2020   Lymphedema 08/21/2020   Abnormal CT of the chest 05/07/2020   Dyspnea on exertion 05/07/2020   Diverticulosis 02/28/2020   Obesity (BMI 30-39.9) 02/28/2020   SCCA (squamous cell carcinoma) of skin 01/03/2020   Dry eyes 12/05/2019   Liver cyst 11/01/2019   Essential hypertension  11/01/2019   Carotid artery stenosis 08/13/2019   Cervical spinal stenosis 08/13/2019   Overactive bladder 03/08/2019   Cough in adult patient 02/19/2019   Irritable bowel syndrome with diarrhea 02/19/2019   Depression, recurrent ([place]) 02/19/2019   Lumbosacral spondylosis without myelopathy 12/06/2018   Aortic atherosclerosis ([place]) 10/10/2018   Fatty liver 09/13/2018   GERD (gastroesophageal reflux disease) 09/13/2018   Prediabetes 09/13/2018   Hiatal hernia 09/13/2018   Retinal drusen of both eyes 12/14/2017   Asthma in remission 05/19/2017   Female stress incontinence 05/08/2017   Hyperlipidemia 04/09/2017   MCI (mild cognitive impairment) 12/17/2015   Urine frequency 08/09/2015   Hypothyroidism 08/09/2015   Posterior vitreous detachment of right eye 07/23/2015   Epiretinal membrane, left 04/12/2012   Nuclear cataract 04/12/2012   Age-related nuclear cataract, bilateral 04/12/2012   Social History   Tobacco Use   Smoking status: Never   Smokeless tobacco: Never  Substance Use Topics   Alcohol use: Yes    Alcohol/week: 1.0 standard drink of alcohol    Types: 1 Cans of beer per week    Comment: once a week beer or wine   Allergies  Allergen Reactions   Epinephrine Other (See Comments) and Anaphylaxis    Heart palpitations   Penicillins Swelling and Other (See Comments)    Lip swelling Has patient had a PCN reaction causing immediate rash, facial/tongue/throat swelling, SOB or lightheadedness with hypotension: Yes Has patient had a PCN reaction causing severe rash involving mucus  membranes or skin necrosis: No Has patient had a PCN reaction that required hospitalization No Has patient had a PCN reaction occurring within the last 10 years: No If all of the above answers are "NO", then may proceed with Cephalosporin use.   Other     Black pepper- caused bronchial tubes to start closing    Valium [Diazepam] Other (See Comments)    Other reaction(s): Other (See  Comments) Overly sensitive  sedation   Current Meds  Medication Sig   albuterol (PROAIR HFA) 108 (90 Base) MCG/ACT inhaler Inhale 1-2 puffs into the lungs every 6 (six) hours as needed for wheezing or shortness of breath.   aspirin 81 MG tablet TAKE 1 TABLET BY MOUTH EVERY DAY   atorvastatin (LIPITOR) 20 MG tablet Take 1 tablet (20 mg total) by mouth daily.   Biotin 1000 MCG tablet Take 1,000 mcg by mouth daily.   Calcium 600-200 MG-UNIT tablet Take 1 tablet daily by mouth.   Cholecalciferol (VITAMIN D3) 2000 UNITS capsule Take 2,000 Units by mouth daily.   citalopram (CELEXA) 20 MG tablet Take 1 tablet (20 mg total) by mouth daily.   colestipol (COLESTID) 1 g tablet Take 1 tablet (1 g total) by mouth 2 (two) times daily. Please schedule a follow up for further refills.   FOLIC ACID PO Take by mouth daily.   levothyroxine (SYNTHROID) 75 MCG tablet Take 1 tablet (75 mcg total) by mouth daily before breakfast.   Magnesium 250 MG TABS Take by mouth.   olmesartan (BENICAR) 20 MG tablet Take 0.5 tablets (10 mg total) by mouth in the morning and at bedtime. D/c 5 mg (10 mg bid of 5 mg)   Omega-3 Fatty Acids (FISH OIL) 1000 MG CAPS Take 1,000 mg by mouth daily.    omeprazole (PRILOSEC) 20 MG capsule Take 1 capsule (20 mg total) by mouth 2 (two) times daily before a meal.   Potassium (POTASSIMIN PO) Take by mouth.   saccharomyces boulardii (FLORASTOR) 250 MG capsule Take 1 capsule (250 mg total) by mouth daily.   vitamin B-12 (CYANOCOBALAMIN) 1000 MCG tablet Take 2,000 mcg by mouth daily. 2 tabs   Immunization History  Administered Date(s) Administered   Fluad Quad(high Dose 65+) 04/25/2019, 04/14/2020, 06/24/2021   Influenza Split 04/18/2017   Influenza, High Dose Seasonal PF 04/18/2017, 04/12/2018   Influenza, Quadrivalent, Recombinant, Inj, Pf 05/03/2018   Influenza,inj,Quad PF,6+ Mos 04/21/2022   Influenza-Unspecified 05/03/2018   PFIZER Comirnaty(Gray Top)Covid-19 Tri-Sucrose Vaccine  12/03/2020   PFIZER(Purple Top)SARS-COV-2 Vaccination 08/12/2019, 09/02/2019, 03/16/2020   Pfizer Covid-19 Vaccine Bivalent Booster 55yrs & up 08/09/2021   Pneumococcal Conjugate-13 05/15/2014   Pneumococcal Polysaccharide-23 12/16/2005, 05/18/2016   Td 10/18/2012   Zoster Recombinat (Shingrix) 06/16/2011        Objective:   Physical Exam BP (!) 118/58 (BP Location: Left Arm, Cuff Size: Large)   Pulse 66   Temp 97.8 F (36.6 C)   Ht 5\' 6"  (1.676 m)   Wt 197 lb (89.4 kg)   SpO2 94%   BMI 31.80 kg/m   SpO2: 94 % O2 Device: None (Room air)  GENERAL: Well-developed somewhat overweight female in no acute distress.  Ambulates with assistance of a cane.  No conversational dyspnea.  Not toxic-appearing. HEAD: Normocephalic, atraumatic. EYES: Pupils equal, round, reactive to light.  No scleral icterus. MOUTH: Oral mucosa moist, no thrush. NECK: Supple. No thyromegaly. Trachea midline. No JVD.  No adenopathy. PULMONARY: Good air entry bilaterally. Somewhat coarse, no other adventitious sounds. CARDIOVASCULAR:  S1 and S2. Regular rate and rhythm.  No rubs, murmurs or gallops heard. ABDOMEN: Obese otherwise, benign. MUSCULOSKELETAL: No joint deformity, no clubbing, no edema. NEUROLOGIC: No focal deficits, no gait disturbance.  Speech is fluent. SKIN: Intact,warm,dry. PSYCH: Anxious mood, normal behavior.  Ambulatory oximetry was performed today: Patient was able to ambulate 750 feet without difficulty.  At rest on room air heart rate was 66 bpm and O2 sats were 96%.  O2 sat nadir was 94% maximum heart rate 89 bpm.  Patient walked at a brisk pace, was able to maintain a conversation during ambulation and did not endorse shortness of breath.  Lab Results  Component Value Date   NITRICOXIDE 28 10/03/2022       Assessment & Plan:     ICD-10-CM   1. SOB (shortness of breath)  R06.02 Nitric oxide    Pulmonary Function Test [facility] Only   Reassess with PFTs Ambulatory oximetry was  reassuring    2. Reactive airways dysfunction syndrome (HCC)  J68.3 Pulmonary Function Test [facility] Only   Likely mild persistent asthma Trial of Breo Ellipta 100 FeNO was indeterminate    3. Hiatal hernia with GERD  K44.9    K21.9    This issue adds complexity to her management Continue PPI twice daily Continue antireflux measures     Orders Placed This Encounter  Procedures   Nitric oxide   Pulmonary Function Test [facility] Only    Standing Status:   Future    Standing Expiration Date:   10/03/2023    Order Specific Question:   Full PFT: includes the following: basic spirometry, spirometry pre & post bronchodilator, diffusion capacity (DLCO), lung volumes    Answer:   Full PFT    Order Specific Question:   This test can only be performed at    Answer:   [facility] ordered this encounter  Medications   BREO ELLIPTA 100-25 MCG/ACT AEPB    Sig: Inhale 1 puff into the lungs daily.    Dispense:  28 each    Refill:  11   We will see the patient in follow-up in 2 to 3 months time she is to contact us prior to that time should any new difficulties arise.  Renold Don, MD Advanced Bronchoscopy [facility]    *This note was dictated using voice recognition software/Dragon.  Despite best efforts to proofread, errors can occur which can change the meaning. Any transcriptional errors that result from this process are unintentional and may not be fully corrected at the time of dictation.

## 2022-10-06 ENCOUNTER — Encounter (INDEPENDENT_AMBULATORY_CARE_PROVIDER_SITE_OTHER): Payer: Self-pay | Admitting: Nurse Practitioner

## 2022-10-06 NOTE — Progress Notes (Signed)
Subjective:    Patient ID: [name], female    DOB: [birth year], 82 y.o.   MRN: [id number] Chief Complaint  Patient presents with   Follow-up    f/u with b.l. venous reflux    [name] is an 82 year old female who presents today for evaluation of left leg redness and tenderness.  She notes that near the end of the day the leg will become red and tender on some days and then some times there is no issues.  The patient has had a history of endovenous laser ablation bilaterally.  Currently there are no open wounds or ulcerations.  Today noninvasive studies show no evidence of deep vein thrombosis or superficial thrombophlebitis bilaterally.  The patient does have bilateral venous insufficiency.  Prior ablations continue to show intact.    Review of Systems  Skin:  Positive for color change.  All other systems reviewed and are negative.      Objective:   Physical Exam Vitals reviewed.  HENT:     Head: Normocephalic.  Cardiovascular:     Rate and Rhythm: Normal rate.     Pulses: Normal pulses.  Musculoskeletal:        General: Tenderness present.     Left lower leg: Edema present.  Skin:    General: Skin is warm and dry.  Neurological:     Mental Status: She is alert and oriented to person, place, and time.  Psychiatric:        Mood and Affect: Mood normal.        Behavior: Behavior normal.        Thought Content: Thought content normal.        Judgment: Judgment normal.     BP 103/67 (BP Location: Right Arm)   Pulse 66   Resp 18   Ht 5\' 6"  (1.676 m)   Wt 199 lb (90.3 kg)   BMI 32.12 kg/m   Past Medical History:  Diagnosis Date   Acid reflux    Allergy    Anemia    Anxiety    in past   Arthritis    all over   Asthma    Atypical moles    Bronchitis    Bronchospasm    reactive with perfume/cologne and smoke   CAD (coronary artery disease)    patient denies on preop of 10/24    Candida esophagitis (HCC)    Carotid artery calcification, bilateral  04/2016   Carpal tunnel syndrome of left wrist 12/03/2019   Cataract    Cervical stenosis of spine    Chronic insomnia    Complication of anesthesia    shoulder surgery- not all the way asleep   COVID-19 02/04/2021   also 10/3 or 04/20/21 sxs started 04/05/21   Depression    in past   Diverticulitis    Diverticulosis    Dizziness    Elevated liver enzymes    Family history of adverse reaction to anesthesia    sister- has problems with nausea and vomiting    Fatigue    Fatty liver    Generalized headache    migraines   GERD (gastroesophageal reflux disease)    Gross hematuria 08/18/2022   Hereditary and idiopathic peripheral neuropathy 05/11/2015   History of chemotherapy    topical cream for h/o skin CA   History of kidney stones    History of neuropathy    FH Dr. Posey Pronto neurology   History of vertigo    Hx of  migraines    Hyperlipidemia    Hypothyroidism    Insomnia    Irritable bowel syndrome    LLQ abdominal pain    Melanoma ([place]) 2013   Melanoma ([place])    x2 stg II melanoma   Neuropathy, peripheral    Peripheral neuropathy    Pneumonia    hx of x 2   PONV (postoperative nausea and vomiting)    Pre-diabetes    Retina disorder    left   SCC (squamous cell carcinoma)    skin    Skin cancer 07/17/2019   SCC left leg s/p mohs   SCC 05/01/18 left medial mid leg, right post mid leg 06/08/18, left mid well 04/10/19  Dr. Delman Cheadle derm, Dr. Karin Golden mohs       Stroke [facility])    ? TIA per Dr V per patient    Thyroid disease    Urine incontinence    Viral URI with cough 06/09/2022   Vitamin D deficiency    Weight gain     Social History   Socioeconomic History   Marital status: Divorced    Spouse name: Not on file   Number of children: 3   Years of education: College   Highest education level: Some college, no degree  Occupational History   Occupation: Retired    Fish farm manager: RETIRED  Tobacco Use   Smoking status: Never   Smokeless tobacco: Never  Vaping Use    Vaping Use: Never used  Substance and Sexual Activity   Alcohol use: Yes    Alcohol/week: 1.0 standard drink of alcohol    Types: 1 Cans of beer per week    Comment: once a week beer or wine   Drug use: No   Sexual activity: Not Currently  Other Topics Concern   Not on file  Social History Narrative   Patient lives at home alone in a one story home.   Has 3 children (2 daughters and 1 son)    Retired from [employer]   Divorced since 1992    Caffeine Use: 1 cup daily and a soft drink occasionally    No guns    Wears seat belt     Lives with cat    Left handed   Social Determinants of Health   Financial Resource Strain: Low Risk  (11/10/2021)   Overall Financial Resource Strain (CARDIA)    Difficulty of Paying Living Expenses: Not hard at all  Food Insecurity: No Food Insecurity (11/10/2021)   Hunger Vital Sign    Worried About Running Out of Food in the Last Year: Never true    Ran Out of Food in the Last Year: Never true  Transportation Needs: No Transportation Needs (11/10/2021)   PRAPARE - Hydrologist (Medical): No    Lack of Transportation (Non-Medical): No  Physical Activity: Sufficiently Active (11/10/2021)   Exercise Vital Sign    Days of Exercise per Week: 6 days    Minutes of Exercise per Session: 60 min  Stress: No Stress Concern Present (11/10/2021)   [place]    Feeling of Stress : Only a little  Social Connections: Unknown (11/10/2021)   Social Connection and Isolation Panel [NHANES]    Frequency of Communication with Friends and Family: More than three times a week    Frequency of Social Gatherings with Friends and Family: Not on file    Attends Religious Services: More  than 4 times per year    Active Member of Clubs or Organizations: Yes    Attends Archivist Meetings: Not on file    Marital Status: Not on file  Intimate Partner  Violence: Not At Risk (11/10/2021)   Humiliation, Afraid, Rape, and Kick questionnaire    Fear of Current or Ex-Partner: No    Emotionally Abused: No    Physically Abused: No    Sexually Abused: No    Past Surgical History:  Procedure Laterality Date   ABLATION Bilateral    Dr. Lucky Cowboy 12/2020 veins laser ablation of both great saphenous veins in a staged fashion.   BILATERAL SALPINGOOPHORECTOMY     ovarian cyst Dr. Ouida Sills and Dr. Alisia Ferrari 2008   BREAST SURGERY     in 2014 bx breast calcifications   BREAST SURGERY     augmentation with saline Dr. Hubbard Hartshorn 1995    CHOLECYSTECTOMY N/A 01/28/2015   Procedure: LAPAROSCOPIC CHOLECYSTECTOMY WITH INTRAOPERATIVE CHOLANGIOGRAM;  Surgeon: Autumn Messing III, MD;  Location: [place];  Service: General;  Laterality: N/A;   CLOSED MANIPULATION SHOULDER     rt   COLONOSCOPY WITH PROPOFOL N/A 11/20/2012   Procedure: COLONOSCOPY WITH PROPOFOL;  Surgeon: Garlan Fair, MD;  Location: [facility];  Service: Endoscopy;  Laterality: N/A;   colonscopy     May 2014   JOINT REPLACEMENT     RTK   KNEE ARTHROSCOPY     left knee miniscus tear repair     MELANOMA EXCISION WITH SENTINEL LYMPH NODE BIOPSY  03/18/2011   Back, nodes neg   OOPHORECTOMY     ROTATOR CUFF REPAIR     left   SHOULDER SURGERY     right and left    SKIN CANCER EXCISION     multiple   TOTAL HIP ARTHROPLASTY     TOTAL KNEE ARTHROPLASTY     right 2010 and left in 2018    [place] Left 05/16/2017   Procedure: LEFT TOTAL KNEE ARTHROPLASTY;  Surgeon: Paralee Cancel, MD;  Location: [facility];  Service: Orthopedics;  Laterality: Left;  70 mins   TUBAL LIGATION     TUMOR REMOVAL     ovary    Family History  Problem Relation Age of Onset   Hypertension Mother    Stroke Mother    Hyperlipidemia Mother    Heart disease Father    Diabetes Father    Hypertension Father    Hyperlipidemia Father    Early death Father    Diabetes Sister    Heart disease Sister         heart murmur   Arthritis Sister    COPD Sister    Hyperlipidemia Sister    Hypertension Sister    Colon polyps Sister        adenomous   Arthritis Sister    Heart disease Sister    Hyperlipidemia Sister    Hypertension Sister    Cancer Sister    Dementia Sister    Tremor Sister    Stroke Maternal Grandmother    Colon cancer Maternal Grandfather    Cancer Maternal Grandfather    Early death Maternal Grandfather    Early death Paternal Grandmother    Stroke Paternal Grandmother    Heart disease Daughter    Hyperlipidemia Daughter    Hypertension Daughter    Heart disease Son    Hyperlipidemia Son    Alcohol abuse Son    Depression Son  Diabetes Son    Pancreatic cancer Maternal Uncle    Alzheimer's disease Paternal Aunt    Alzheimer's disease Paternal Aunt    Alzheimer's disease Paternal Aunt    Alzheimer's disease Paternal Aunt    Cancer Other        2 paternal uncles and 1 maternal uncle colon cancer   Esophageal cancer Neg Hx    Stomach cancer Neg Hx     Allergies  Allergen Reactions   Epinephrine Other (See Comments) and Anaphylaxis    Heart palpitations   Penicillins Swelling and Other (See Comments)    Lip swelling Has patient had a PCN reaction causing immediate rash, facial/tongue/throat swelling, SOB or lightheadedness with hypotension: Yes Has patient had a PCN reaction causing severe rash involving mucus membranes or skin necrosis: No Has patient had a PCN reaction that required hospitalization No Has patient had a PCN reaction occurring within the last 10 years: No If all of the above answers are "NO", then may proceed with Cephalosporin use.   Other     Black pepper- caused bronchial tubes to start closing    Valium [Diazepam] Other (See Comments)    Other reaction(s): Other (See Comments) Overly sensitive  sedation       Latest Ref Rng & Units 08/22/2022    2:42 PM 03/31/2022    3:15 PM 10/11/2021    8:39 AM  CBC  WBC 4.0 - 10.5 K/uL 5.9  7.7   5.4   Hemoglobin 12.0 - 15.0 g/dL 12.9  13.5  13.6   Hematocrit 36.0 - 46.0 % 38.6  41.1  41.7   Platelets 150.0 - 400.0 K/uL 216.0  218.0  192.0       CMP     Component Value Date/Time   NA 139 08/22/2022 1442   NA 143 04/07/2016 1158   K 4.5 08/22/2022 1442   K 4.0 04/07/2016 1158   CL 103 08/22/2022 1442   CL 113 (H) 05/22/2012 1004   CO2 24 08/22/2022 1442   CO2 21 (L) 04/07/2016 1158   GLUCOSE 78 08/22/2022 1442   GLUCOSE 103 04/07/2016 1158   GLUCOSE 111 (H) 05/22/2012 1004   BUN 16 08/22/2022 1442   BUN 23.0 04/07/2016 1158   CREATININE 1.06 08/22/2022 1442   CREATININE 1.10 (H) 04/14/2020 0826   CREATININE 0.9 04/07/2016 1158   CALCIUM 9.0 08/22/2022 1442   CALCIUM 9.3 04/07/2016 1158   PROT 6.8 08/22/2022 1442   PROT 7.1 04/07/2016 1158   ALBUMIN 4.3 08/22/2022 1442   ALBUMIN 3.9 04/07/2016 1158   AST 17 08/22/2022 1442   AST 12 04/07/2016 1158   ALT 14 08/22/2022 1442   ALT 16 04/07/2016 1158   ALKPHOS 76 08/22/2022 1442   ALKPHOS 79 04/07/2016 1158   BILITOT 1.2 08/22/2022 1442   BILITOT 1.40 (H) 04/07/2016 1158   GFRNONAA 60 (L) 02/24/2021 0945   GFRAA >60 05/31/2019 1251     No results found.     Assessment & Plan:   1. Left leg pain No evidence of DVT detected.  Suspect this was led to some swelling from some venous insufficiency.  Patient advised to continue with conservative therapy as outlined below. - VAS [name] LOWER EXTREMITY VENOUS REFLUX  2. Redness of skin This has resolved, suspect it may be related to some lower venous insufficiency..  Patient advised to continue with use of medical grade compression socks regularly in addition to elevation and activity.  She should use 20 to 30  mmHg.  We will plan to have the patient return in 6 months to evaluate lower extremity edema following conservative therapy as listed above.  Patient may be considered for lymphedema pump at that time. - VAS [name] LOWER EXTREMITY VENOUS REFLUX   Current Outpatient  Medications on File Prior to Visit  Medication Sig Dispense Refill   albuterol (PROAIR HFA) 108 (90 Base) MCG/ACT inhaler Inhale 1-2 puffs into the lungs every 6 (six) hours as needed for wheezing or shortness of breath. 18 g 11   aspirin 81 MG tablet TAKE 1 TABLET BY MOUTH EVERY DAY     atorvastatin (LIPITOR) 20 MG tablet Take 1 tablet (20 mg total) by mouth daily. 90 tablet 3   Biotin 1000 MCG tablet Take 1,000 mcg by mouth daily.     Calcium 600-200 MG-UNIT tablet Take 1 tablet daily by mouth.     Cholecalciferol (VITAMIN D3) 2000 UNITS capsule Take 2,000 Units by mouth daily.     citalopram (CELEXA) 20 MG tablet Take 1 tablet (20 mg total) by mouth daily. 90 tablet 3   colestipol (COLESTID) 1 g tablet Take 1 tablet (1 g total) by mouth 2 (two) times daily. Please schedule a follow up for further refills. 60 tablet 1   FOLIC ACID PO Take by mouth daily.     levothyroxine (SYNTHROID) 75 MCG tablet Take 1 tablet (75 mcg total) by mouth daily before breakfast. 90 tablet 3   Magnesium 250 MG TABS Take by mouth.     olmesartan (BENICAR) 20 MG tablet Take 0.5 tablets (10 mg total) by mouth in the morning and at bedtime. D/c 5 mg (10 mg bid of 5 mg) 90 tablet 3   Omega-3 Fatty Acids (FISH OIL) 1000 MG CAPS Take 1,000 mg by mouth daily.      omeprazole (PRILOSEC) 20 MG capsule Take 1 capsule (20 mg total) by mouth 2 (two) times daily before a meal. 180 capsule 3   Potassium (POTASSIMIN PO) Take by mouth.     saccharomyces boulardii (FLORASTOR) 250 MG capsule Take 1 capsule (250 mg total) by mouth daily. 90 capsule 0   sulfamethoxazole-trimethoprim (BACTRIM DS) 800-160 MG tablet Take 1 tablet by mouth 2 (two) times daily. 14 tablet 0   tiZANidine (ZANAFLEX) 2 MG tablet Take 1 tablet (2 mg total) by mouth at bedtime as needed for muscle spasms. 30 tablet 2   vitamin B-12 (CYANOCOBALAMIN) 1000 MCG tablet Take 2,000 mcg by mouth daily. 2 tabs     No current facility-administered medications on file  prior to visit.    There are no Patient Instructions on file for this visit. No follow-ups on file.   Kris Hartmann, NP

## 2022-10-11 ENCOUNTER — Other Ambulatory Visit (INDEPENDENT_AMBULATORY_CARE_PROVIDER_SITE_OTHER): Payer: PPO

## 2022-10-11 ENCOUNTER — Other Ambulatory Visit: Payer: PPO

## 2022-10-11 ENCOUNTER — Telehealth: Payer: Self-pay

## 2022-10-11 DIAGNOSIS — N3001 Acute cystitis with hematuria: Secondary | ICD-10-CM | POA: Diagnosis not present

## 2022-10-11 NOTE — Telephone Encounter (Signed)
Patient came in today to the lab and requested to see me. She stated that on this past Saturday she vomited and it was blood in it and she kept the vomit to get a closer look, she stated on Monday she was nauseous and she called and spoke with her GI provider and he stated she should go to the ED, she also stated her friend also suggested she go to the ED and she stated she was not doing that.  I also suggested that she go to the ED  and she stated she would not go to the Ed or urgent care.  She just wanted the provider to know about this.  Keylani Perlstein,cma

## 2022-10-12 ENCOUNTER — Other Ambulatory Visit: Payer: PPO

## 2022-10-12 LAB — URINE CULTURE
MICRO NUMBER:: 14743067
Result:: NO GROWTH
SPECIMEN QUALITY:: ADEQUATE

## 2022-10-12 NOTE — Telephone Encounter (Signed)
I called and LVM for patient to call back and schedule a visit with any provider available today or tomorrow to be checked for her vomiting and nausea.  Vyncent Overby,cma

## 2022-10-12 NOTE — Telephone Encounter (Signed)
I called the patient and inforemed her that we did not receive a form t sign for OT/PT and I informed her to call and tell them to fax it to our number, she was driving so I sent the fax number of our office to the patient on mychart and she understood.  Raedyn Klinck,cma

## 2022-10-15 DIAGNOSIS — J40 Bronchitis, not specified as acute or chronic: Secondary | ICD-10-CM | POA: Diagnosis not present

## 2022-10-15 DIAGNOSIS — J0110 Acute frontal sinusitis, unspecified: Secondary | ICD-10-CM | POA: Diagnosis not present

## 2022-10-15 DIAGNOSIS — J683 Other acute and subacute respiratory conditions due to chemicals, gases, fumes and vapors: Secondary | ICD-10-CM | POA: Diagnosis not present

## 2022-10-18 ENCOUNTER — Telehealth: Payer: Self-pay | Admitting: Pulmonary Disease

## 2022-10-18 NOTE — Telephone Encounter (Signed)
PT states Dr. Patsey Berthold wanted her to have PFT sched. Pls call to advise. Her # is [phone]

## 2022-10-19 ENCOUNTER — Telehealth: Payer: Self-pay | Admitting: Family Medicine

## 2022-10-19 DIAGNOSIS — I10 Essential (primary) hypertension: Secondary | ICD-10-CM | POA: Diagnosis not present

## 2022-10-19 DIAGNOSIS — J069 Acute upper respiratory infection, unspecified: Secondary | ICD-10-CM | POA: Diagnosis not present

## 2022-10-19 DIAGNOSIS — Z6831 Body mass index (BMI) 31.0-31.9, adult: Secondary | ICD-10-CM | POA: Diagnosis not present

## 2022-10-19 DIAGNOSIS — J029 Acute pharyngitis, unspecified: Secondary | ICD-10-CM | POA: Diagnosis not present

## 2022-10-19 DIAGNOSIS — Z03818 Encounter for observation for suspected exposure to other biological agents ruled out: Secondary | ICD-10-CM | POA: Diagnosis not present

## 2022-10-19 NOTE — Progress Notes (Unsigned)
Leah Morrow, NP-C Phone: [phone]  [name] is a 82 y.o. female who presents today for cough and congestion.   Patient was seen on 10/15/2022 at a walk in clinic and diagnosed with sinusitis and bronchitis and treated with Doxycycline and Prednisone. She then went to the [facility] yesterday and was tested for COVID, Flu and Strep- all of which were negative. She continues to have cough and congestion. She has finished her Prednisone, but still has 5 days left on her Doxycycline. She has been using her Albuterol inhaler as needed, most recently early this morning. Reports her symptoms are worse at night. She has been seeing Pulmonology for shortness of breath and asthma. She has lung function testing coming up.   Respiratory illness:  Cough- Yes, productive  Congestion-    Sinus- Yes   Chest- Yes  Post nasal drip- Yes  Sore throat- No  Shortness of breath- Yes  Fever- No  Fatigue/Myalgia- Yes Headache- Yes Nausea/Vomiting- No Taste disturbance- No  Smell disturbance- No  Covid exposure- No  Covid vaccination- x 5  Flu vaccination- UTD  Medications- cough medication   Social History   Tobacco Use  Smoking Status Never  Smokeless Tobacco Never    Current Outpatient Medications on File Prior to Visit  Medication Sig Dispense Refill   aspirin 81 MG tablet TAKE 1 TABLET BY MOUTH EVERY DAY     atorvastatin (LIPITOR) 20 MG tablet Take 1 tablet (20 mg total) by mouth daily. 90 tablet 3   Biotin 1000 MCG tablet Take 1,000 mcg by mouth daily.     BREO ELLIPTA 100-25 MCG/ACT AEPB Inhale 1 puff into the lungs daily. 28 each 11   Calcium 600-200 MG-UNIT tablet Take 1 tablet daily by mouth.     Cholecalciferol (VITAMIN D3) 2000 UNITS capsule Take 2,000 Units by mouth daily.     citalopram (CELEXA) 20 MG tablet Take 1 tablet (20 mg total) by mouth daily. 90 tablet 3   colestipol (COLESTID) 1 g tablet Take 1 tablet (1 g total) by mouth 2 (two) times daily. Please schedule a  follow up for further refills. 60 tablet 1   FOLIC ACID PO Take by mouth daily.     levothyroxine (SYNTHROID) 75 MCG tablet Take 1 tablet (75 mcg total) by mouth daily before breakfast. 90 tablet 3   Magnesium 250 MG TABS Take by mouth.     olmesartan (BENICAR) 20 MG tablet Take 0.5 tablets (10 mg total) by mouth in the morning and at bedtime. D/c 5 mg (10 mg bid of 5 mg) 90 tablet 3   Omega-3 Fatty Acids (FISH OIL) 1000 MG CAPS Take 1,000 mg by mouth daily.      omeprazole (PRILOSEC) 20 MG capsule Take 1 capsule (20 mg total) by mouth 2 (two) times daily before a meal. 180 capsule 3   Potassium (POTASSIMIN PO) Take by mouth.     saccharomyces boulardii (FLORASTOR) 250 MG capsule Take 1 capsule (250 mg total) by mouth daily. 90 capsule 0   sulfamethoxazole-trimethoprim (BACTRIM DS) 800-160 MG tablet Take 1 tablet by mouth 2 (two) times daily. 14 tablet 0   tiZANidine (ZANAFLEX) 2 MG tablet Take 1 tablet (2 mg total) by mouth at bedtime as needed for muscle spasms. 30 tablet 2   vitamin B-12 (CYANOCOBALAMIN) 1000 MCG tablet Take 2,000 mcg by mouth daily. 2 tabs     No current facility-administered medications on file prior to visit.    ROS see history  of present illness  Objective  Physical Exam Vitals:   10/20/22 0835  BP: 138/60  Pulse: 67  Temp: 98.9 F (37.2 C)  SpO2: 96%    BP Readings from Last 3 Encounters:  10/20/22 138/60  10/03/22 (!) 118/58  09/16/22 103/67   Wt Readings from Last 3 Encounters:  10/20/22 200 lb 6.4 oz (90.9 kg)  10/03/22 197 lb (89.4 kg)  09/16/22 199 lb (90.3 kg)    Physical Exam Constitutional:      General: She is not in acute distress.    Appearance: Normal appearance. She is not ill-appearing.  HENT:     Head: Normocephalic.     Right Ear: Tympanic membrane normal.     Left Ear: Tympanic membrane normal.     Nose: Nose normal.     Mouth/Throat:     Mouth: Mucous membranes are moist.     Pharynx: Oropharynx is clear.  Eyes:      Conjunctiva/sclera: Conjunctivae normal.     Pupils: Pupils are equal, round, and reactive to light.  Cardiovascular:     Rate and Rhythm: Normal rate and regular rhythm.     Heart sounds: Normal heart sounds.  Pulmonary:     Effort: Pulmonary effort is normal.     Breath sounds: Normal breath sounds. No stridor. No wheezing or rhonchi.  Lymphadenopathy:     Cervical: No cervical adenopathy.  Skin:    General: Skin is warm and dry.  Neurological:     General: No focal deficit present.     Mental Status: She is alert.  Psychiatric:        Mood and Affect: Mood normal.        Behavior: Behavior normal.    Assessment/Plan: Please see individual problem list.  Bronchitis -     DG Chest 2 View; Future -     predniSONE; TAKE 3 TABLETS PO QD FOR 3 DAYS THEN TAKE 2 TABLETS PO QD FOR 3 DAYS THEN TAKE 1 TABLET PO QD FOR 3 DAYS THEN TAKE 1/2 TAB PO QD FOR 3 DAYS  Dispense: 20 tablet; Refill: 0 -     Albuterol Sulfate HFA; Inhale 1-2 puffs into the lungs every 6 (six) hours as needed for wheezing or shortness of breath.  Dispense: 18 g; Refill: 11    Return if symptoms worsen or fail to improve.   Leah Morrow, NP-C [place]

## 2022-10-19 NOTE — Telephone Encounter (Signed)
Pt called in stating she went to a walk in for wheezing, soreness in chest, coughing  and she was given antibotics and pt is still not feeling better. Sent to access nurse

## 2022-10-20 ENCOUNTER — Telehealth: Payer: Self-pay

## 2022-10-20 ENCOUNTER — Encounter: Payer: Self-pay | Admitting: Nurse Practitioner

## 2022-10-20 ENCOUNTER — Ambulatory Visit (INDEPENDENT_AMBULATORY_CARE_PROVIDER_SITE_OTHER): Payer: PPO | Admitting: Nurse Practitioner

## 2022-10-20 ENCOUNTER — Ambulatory Visit (INDEPENDENT_AMBULATORY_CARE_PROVIDER_SITE_OTHER): Payer: PPO

## 2022-10-20 VITALS — BP 138/60 | HR 67 | Temp 98.9°F | Ht 66.0 in | Wt 200.4 lb

## 2022-10-20 DIAGNOSIS — I700 Atherosclerosis of aorta: Secondary | ICD-10-CM | POA: Diagnosis not present

## 2022-10-20 DIAGNOSIS — J40 Bronchitis, not specified as acute or chronic: Secondary | ICD-10-CM | POA: Diagnosis not present

## 2022-10-20 DIAGNOSIS — J45909 Unspecified asthma, uncomplicated: Secondary | ICD-10-CM | POA: Diagnosis not present

## 2022-10-20 DIAGNOSIS — R059 Cough, unspecified: Secondary | ICD-10-CM | POA: Diagnosis not present

## 2022-10-20 MED ORDER — ALBUTEROL SULFATE HFA 108 (90 BASE) MCG/ACT IN AERS
1.0000 | INHALATION_SPRAY | Freq: Four times a day (QID) | RESPIRATORY_TRACT | 11 refills | Status: DC | PRN
Start: 2022-10-20 — End: 2023-11-20

## 2022-10-20 MED ORDER — PREDNISONE 10 MG PO TABS
ORAL_TABLET | ORAL | 0 refills | Status: DC
Start: 2022-10-20 — End: 2022-11-30

## 2022-10-20 NOTE — Assessment & Plan Note (Signed)
Will get chest x-ray today. Lungs clear on exam, no wheezing noted. Will add Prednisone taper. Encouraged patient to complete Doxycyline prescription as prescribed. Advised patient to use albuterol inhaler, 2 puffs every 6 hours for the next 2 days then can go back to as needed. Strict return precautions given to patient. Encouraged follow up with Pulmonology.

## 2022-10-20 NOTE — Telephone Encounter (Signed)
Pt called saying she is at [facility] and the steroid you said you were going to send in has not been sent.

## 2022-10-21 ENCOUNTER — Telehealth: Payer: Self-pay

## 2022-10-21 NOTE — Telephone Encounter (Signed)
Patient states she had a chest x-ray yesterday after she saw Bethanie Dicker, NP.  Phlegm coming up from her chest has blood and is dark brown.  Patient states it was green yesterday.  Patient states she would like to know if we have the results of her x-ray.  I transferred call to Access Nurse.

## 2022-10-21 NOTE — Telephone Encounter (Signed)
Access nurse called stating the pt need to be seen within 24 hours. I talked the pt and she does not want to wait until Monday to get her chest xray results because she thinks she has pneumonia. Pt saw [name] for this appointment and pt would like to be called

## 2022-10-21 NOTE — Telephone Encounter (Signed)
I have contacted the reading room. It should result shortly. My apologies for the delayed response.

## 2022-10-21 NOTE — Telephone Encounter (Signed)
Pt was informed to go to the ED, pts sated her "sputum" is back to normal green and yellow and that the blood was in her "sputum"   Pt really wants her Xray results does not want to wait because she believes it is pneumonia. Pt stated she will try and wait. I advised pt again to go to the ED and especially if her sx's worsen. Pt verbalized understanding.

## 2022-10-24 ENCOUNTER — Other Ambulatory Visit
Admission: RE | Admit: 2022-10-24 | Discharge: 2022-10-24 | Disposition: A | Discharge status: Home / Self Care | Payer: PPO | Source: Ambulatory Visit | Attending: Student | Admitting: Student

## 2022-10-24 DIAGNOSIS — R042 Hemoptysis: Secondary | ICD-10-CM | POA: Diagnosis not present

## 2022-10-24 DIAGNOSIS — R062 Wheezing: Secondary | ICD-10-CM | POA: Diagnosis not present

## 2022-10-24 DIAGNOSIS — R0602 Shortness of breath: Secondary | ICD-10-CM | POA: Diagnosis not present

## 2022-10-24 DIAGNOSIS — J209 Acute bronchitis, unspecified: Secondary | ICD-10-CM | POA: Diagnosis not present

## 2022-10-24 DIAGNOSIS — N1831 Chronic kidney disease, stage 3a: Secondary | ICD-10-CM | POA: Diagnosis not present

## 2022-10-24 DIAGNOSIS — Z03818 Encounter for observation for suspected exposure to other biological agents ruled out: Secondary | ICD-10-CM | POA: Insufficient documentation

## 2022-10-24 DIAGNOSIS — I10 Essential (primary) hypertension: Secondary | ICD-10-CM | POA: Diagnosis not present

## 2022-10-24 DIAGNOSIS — R059 Cough, unspecified: Secondary | ICD-10-CM | POA: Diagnosis not present

## 2022-10-24 LAB — D-DIMER, QUANTITATIVE: D-Dimer, Quant: 0.53 ug/mL-FEU — ABNORMAL HIGH (ref 0.00–0.50)

## 2022-10-25 ENCOUNTER — Telehealth: Payer: Self-pay | Admitting: Cardiovascular Disease

## 2022-10-25 NOTE — Telephone Encounter (Signed)
Patient stated she wants to know if Dr. Mariah Milling would recommend either Dr. Meredeth Ide, Dr. Welton Flakes or any other pulmonologist in [place] area as she has lung issues and needs to get into a breathing and lung strengthening program.

## 2022-10-25 NOTE — Telephone Encounter (Signed)
Patient wanted to add that she went to [facility] in Clinic yesterday and the told her she had an enlarged heart and her lower left lung was showing white which indicates probable damage to her lung and pneumonia.

## 2022-10-25 NOTE — Telephone Encounter (Signed)
Patient returned call

## 2022-10-25 NOTE — Telephone Encounter (Signed)
Left a message for the patient to call back.  

## 2022-10-25 NOTE — Telephone Encounter (Signed)
Spoke with patient and she reports having lung problems which she has been seeing Dr. Jayme Cloud but would like second opinion and would like to know your recommendations for a great pulmonary provider. She has looked and had 2 names Dr. Meredeth Ide and Dr. Park Breed but she really wants your opinion of provider. She did state the enlarged heart they mentioned was due to lung issues. In summary she wants your recommendations.

## 2022-10-25 NOTE — Telephone Encounter (Signed)
Rerouting due to no reply to pt recorded. TY

## 2022-10-26 NOTE — Telephone Encounter (Signed)
Pt made aware of results along with MD's recommendation. Pt stated she is currently in bed sick and will call back at a later date for pulmonologist name.   Antonieta Iba, MD  [facility] [phone] minutes ago (3:38 PM)     recent chest x-ray, looked okay to me with no acute findings as she mentioned Heart size is normal, nothing mentioned about the lungs  If she would like to have a second opinion for pulmonary disease, would recommend Dr. Karna Christmas at [facility]  Chest x-ray October 20, 2022 report as below: FINDINGS: The heart is normal in size. Atherosclerotic calcification of the aortic arch. Lungs are clear without evidence of focal consolidation or pleural effusion. Thoracic spondylosis with mild kyphosis. Cholecystectomy clips and surgical anchor over the right humeral head suggesting prior rotator cuff repair.   IMPRESSION: No active cardiopulmonary disease.  Thx TG

## 2022-10-26 NOTE — Telephone Encounter (Signed)
[name], please advise. Thanks 

## 2022-10-28 NOTE — Telephone Encounter (Signed)
I spoke with [name] this morning and made her aware when  I get PFT appts for May I would be calling her back to schedule. She was just trying to work around her beach trip which she will be returning on 11/26/22

## 2022-10-31 ENCOUNTER — Telehealth: Payer: Self-pay | Admitting: Gastroenterology

## 2022-10-31 NOTE — Telephone Encounter (Signed)
PT is calling to get a refill on her colestipol RX sent to [facility] in [place] on [address]. Please advise.

## 2022-11-02 ENCOUNTER — Other Ambulatory Visit: Payer: Self-pay

## 2022-11-02 MED ORDER — COLESTIPOL HCL 1 G PO TABS: 1.0000 g | ORAL_TABLET | Freq: Two times a day (BID) | ORAL | 2 refills | Status: DC

## 2022-11-02 NOTE — Telephone Encounter (Signed)
Refill sent to get patient to July appt

## 2022-11-03 ENCOUNTER — Ambulatory Visit: Payer: PPO | Admitting: Gastroenterology

## 2022-11-08 ENCOUNTER — Telehealth: Payer: Self-pay | Admitting: Family Medicine

## 2022-11-08 NOTE — Telephone Encounter (Signed)
Pt called stating her left leg is red and hot and she is hurting in her lower right stomach. Sent to access nurse

## 2022-11-08 NOTE — Telephone Encounter (Signed)
Called pt to check to see how she is doing.  Chest pain: NO Numbness: NO Dizziness: NO Nausea: NO  Yellow eyes: NO Swollen ankles: NO  Pt legs feel better abdominal  pain only occurs when leaning over/ putting pressure on it. Pt informed me that she was just waking up and that she will just go to [facility] if things get worst but as of right now she is fine but the abdominal pain started om Sunday near the pelvic area almost, she stated it felt like a dull ache and then got worst when she put pressure on it yesterday.   Pt stated that she got an xray done at [facility] clinic since getting one when [facility] ordered it she was placed on levaquin for 10 days (she has finished it), pt stated she will be asking them to forward [name] info on that visit as well as bringing her AVS to her next appointment.

## 2022-11-08 NOTE — Telephone Encounter (Signed)
Pt returned [facility] CMA call, as per pt, she never finished talking to access nurse and hang up to answer [facility] CMA call. I tried to call access nurse again for her, but while waiting pt hanged up on me as well.

## 2022-11-08 NOTE — Telephone Encounter (Signed)
LVM to call and check on pt to see how she is doing

## 2022-11-09 NOTE — Telephone Encounter (Signed)
Left detailed VM informing pt that if sx's worsen to make an appt or go to the urgent care, and to let us know how she is feeling on today.

## 2022-11-10 ENCOUNTER — Telehealth: Payer: Self-pay | Admitting: Family Medicine

## 2022-11-10 NOTE — Telephone Encounter (Signed)
Contacted [facility] to schedule their annual wellness visit. Call back at later date: Week of 12/05/2022  Patient will be out of town for a couple of weeks.  Thank you,  [facility] Direct dial  [phone]

## 2022-11-19 DIAGNOSIS — M5136 Other intervertebral disc degeneration, lumbar region: Secondary | ICD-10-CM | POA: Diagnosis not present

## 2022-11-19 DIAGNOSIS — M545 Low back pain, unspecified: Secondary | ICD-10-CM | POA: Diagnosis not present

## 2022-11-19 DIAGNOSIS — R1030 Lower abdominal pain, unspecified: Secondary | ICD-10-CM | POA: Diagnosis not present

## 2022-11-27 ENCOUNTER — Encounter: Payer: Self-pay | Admitting: Emergency Medicine

## 2022-11-27 ENCOUNTER — Other Ambulatory Visit: Payer: Self-pay

## 2022-11-27 ENCOUNTER — Emergency Department
Admission: EM | Admit: 2022-11-27 | Discharge: 2022-11-28 | Disposition: A | Discharge status: Home / Self Care | Payer: PPO | Attending: Emergency Medicine | Admitting: Emergency Medicine

## 2022-11-27 ENCOUNTER — Emergency Department: Payer: PPO

## 2022-11-27 DIAGNOSIS — Z8543 Personal history of malignant neoplasm of ovary: Secondary | ICD-10-CM | POA: Diagnosis not present

## 2022-11-27 DIAGNOSIS — J45909 Unspecified asthma, uncomplicated: Secondary | ICD-10-CM | POA: Insufficient documentation

## 2022-11-27 DIAGNOSIS — R1032 Left lower quadrant pain: Secondary | ICD-10-CM | POA: Diagnosis not present

## 2022-11-27 DIAGNOSIS — R197 Diarrhea, unspecified: Secondary | ICD-10-CM | POA: Diagnosis not present

## 2022-11-27 DIAGNOSIS — K5730 Diverticulosis of large intestine without perforation or abscess without bleeding: Secondary | ICD-10-CM | POA: Diagnosis not present

## 2022-11-27 DIAGNOSIS — N3000 Acute cystitis without hematuria: Secondary | ICD-10-CM

## 2022-11-27 DIAGNOSIS — Z85820 Personal history of malignant melanoma of skin: Secondary | ICD-10-CM | POA: Diagnosis not present

## 2022-11-27 DIAGNOSIS — I2510 Atherosclerotic heart disease of native coronary artery without angina pectoris: Secondary | ICD-10-CM | POA: Diagnosis not present

## 2022-11-27 DIAGNOSIS — R9431 Abnormal electrocardiogram [ECG] [EKG]: Secondary | ICD-10-CM | POA: Diagnosis not present

## 2022-11-27 DIAGNOSIS — R1031 Right lower quadrant pain: Secondary | ICD-10-CM | POA: Diagnosis not present

## 2022-11-27 DIAGNOSIS — R1030 Lower abdominal pain, unspecified: Secondary | ICD-10-CM | POA: Diagnosis not present

## 2022-11-27 LAB — TSH: TSH: 2.176 u[IU]/mL (ref 0.350–4.500)

## 2022-11-27 LAB — COMPREHENSIVE METABOLIC PANEL
ALT: 16 U/L (ref 0–44)
AST: 18 U/L (ref 15–41)
Albumin: 4.2 g/dL (ref 3.5–5.0)
Alkaline Phosphatase: 79 U/L (ref 38–126)
Anion gap: 10 (ref 5–15)
BUN: 16 mg/dL (ref 8–23)
CO2: 25 mmol/L (ref 22–32)
Calcium: 8.7 mg/dL — ABNORMAL LOW (ref 8.9–10.3)
Chloride: 103 mmol/L (ref 98–111)
Creatinine, Ser: 1.01 mg/dL — ABNORMAL HIGH (ref 0.44–1.00)
GFR, Estimated: 56 mL/min — ABNORMAL LOW (ref >60)
Glucose, Bld: 97 mg/dL (ref 70–99)
Potassium: 3.6 mmol/L (ref 3.5–5.1)
Sodium: 138 mmol/L (ref 135–145)
Total Bilirubin: 1.6 mg/dL — ABNORMAL HIGH (ref 0.3–1.2)
Total Protein: 6.7 g/dL (ref 6.5–8.1)

## 2022-11-27 LAB — URINALYSIS, ROUTINE W REFLEX MICROSCOPIC
Bilirubin Urine: NEGATIVE
Glucose, UA: NEGATIVE mg/dL
Ketones, ur: NEGATIVE mg/dL
Nitrite: NEGATIVE
Protein, ur: NEGATIVE mg/dL
Specific Gravity, Urine: 1.02 (ref 1.005–1.030)
pH: 6 (ref 5.0–8.0)

## 2022-11-27 LAB — CBC
HCT: 40.2 % (ref 36.0–46.0)
Hemoglobin: 12.6 g/dL (ref 12.0–15.0)
MCH: 29.4 pg (ref 26.0–34.0)
MCHC: 31.3 g/dL (ref 30.0–36.0)
MCV: 93.7 fL (ref 80.0–100.0)
Platelets: 215 10*3/uL (ref 150–400)
RBC: 4.29 MIL/uL (ref 3.87–5.11)
RDW: 13.6 % (ref 11.5–15.5)
WBC: 6.1 10*3/uL (ref 4.0–10.5)
nRBC: 0 % (ref 0.0–0.2)

## 2022-11-27 LAB — LIPASE, BLOOD: Lipase: 35 U/L (ref 11–51)

## 2022-11-27 MED ORDER — IOHEXOL 300 MG/ML  SOLN
100.0000 mL | Freq: Once | INTRAMUSCULAR | Status: AC | PRN
  Administered 2022-11-27: 100 mL via INTRAVENOUS

## 2022-11-27 NOTE — ED Provider Notes (Signed)
[facility] Provider Note    Event Date/Time   First MD Initiated Contact with Patient 11/27/22 2031     (approximate)   History   Abdominal Pain   HPI  [name] is a 82 y.o. female past medical history significant for GERD, thyroid disorder, oophorectomy for ovarian cancer, CAD, asthma, history of pancreatitis, prior cholecystectomy, history of malignant melanoma, who presents to the emergency department with abdominal pain.  Patient states that she has had intermittent abdominal pain over the past 2 weeks.  Initially had a urinary tract infection and was treated with antibiotics.  Felt like her symptoms improved but has some ongoing slight discomfort.  Over the past couple of days has noted some diarrhea with watery stool.  4 episodes of watery stool today.  Recently on antibiotics for urinary tract infection and for pneumonia.  Over the past 2 weeks while she was at the beach had right lower quadrant abdominal pain that has moved to her left lower abdomen.  Intermittent episodes of sharp shooting pain.  Does endorse some hematuria.  Denies any fever or chills.  No nausea or vomiting.  No history of C. difficile.     Physical Exam   Triage Vital Signs: ED Triage Vitals  Enc Vitals Group     BP 11/27/22 1707 (!) 167/74     Pulse Rate 11/27/22 1707 61     Resp 11/27/22 1707 16     Temp 11/27/22 1707 98 F (36.7 C)     Temp Source 11/27/22 1707 Oral     SpO2 11/27/22 1707 98 %     Weight 11/27/22 1708 200 lb 6.4 oz (90.9 kg)     Height 11/27/22 1708 5\' 6"  (1.676 m)     Head Circumference --      Peak Flow --      Pain Score 11/27/22 1708 0     Pain Loc --      Pain Edu? --      Excl. in GC? --     Most recent vital signs: Vitals:   11/27/22 2333 11/28/22 0003  BP: (!) 151/67   Pulse: 73   Resp:    Temp:  98 F (36.7 C)  SpO2: 98%     Physical Exam Constitutional:      Appearance: She is well-developed.  HENT:     Head: Atraumatic.   Eyes:     Conjunctiva/sclera: Conjunctivae normal.  Cardiovascular:     Rate and Rhythm: Regular rhythm.  Pulmonary:     Effort: No respiratory distress.  Abdominal:     General: There is distension.     Tenderness: There is abdominal tenderness in the right lower quadrant, suprapubic area and left lower quadrant.  Musculoskeletal:        General: Normal range of motion.     Cervical back: Normal range of motion.  Skin:    General: Skin is warm.  Neurological:     Mental Status: She is alert. Mental status is at baseline.     IMPRESSION / MDM / ASSESSMENT AND PLAN / ED COURSE  I reviewed the triage vital signs and the nursing notes.  On chart review patient has been evaluated recently on antibiotics with pneumonia and for urinary tract infection.  Differential diagnosis including diverticulitis, malignancy, C. difficile, pyelonephritis, kidney stones, urinary tract infection  EKG  I, Corena Herter, the attending physician, personally viewed and interpreted this ECG.   Rate: Normal  Rhythm:  Normal sinus  Axis: Normal  Intervals: Normal  ST&T Change: None  No tachycardic or bradycardic dysrhythmias while on cardiac telemetry.  RADIOLOGY I independently reviewed imaging, my interpretation of imaging: CT abdomen and pelvis with contrast  LABS (all labs ordered are listed, but only abnormal results are displayed) Labs interpreted as -    Labs Reviewed  COMPREHENSIVE METABOLIC PANEL - Abnormal; Notable for the following components:      Result Value   Creatinine, Ser 1.01 (*)    Calcium 8.7 (*)    Total Bilirubin 1.6 (*)    GFR, Estimated 56 (*)    All other components within normal limits  URINALYSIS, ROUTINE W REFLEX MICROSCOPIC - Abnormal; Notable for the following components:   Color, Urine YELLOW (*)    Hgb urine dipstick TRACE (*)    Leukocytes,Ua SMALL (*)    Bacteria, UA RARE (*)    All other components within normal limits  C DIFFICILE QUICK SCREEN W  PCR REFLEX    URINE CULTURE  LIPASE, BLOOD  CBC  TSH     MDM  CT scan showed no signs of acute intra-abdominal pathology.  Diverticulosis but no signs of acute diverticulitis.  Moderate sized hiatal hernia.  Urine appears infected with findings concerning for urinary tract infection.    On chart review on culture data sensitive to cephalosporins with ceftriaxone.  Patient has a history of penicillin allergy with swelling however states that she has tolerated Keflex in the past.  Will start first dose of IV Rocephin.  Attempted to get a stool sample to evaluate for C. difficile however did not provide a stool sample in the emergency department with no further episodes of diarrhea.  Discussed close follow-up with her primary care physician and she may need to provide a stool sample as an outpatient.  Will start the patient on Keflex.  Given return precautions for worsening symptoms.     PROCEDURES:  Critical Care performed: No  Procedures  Patient's presentation is most consistent with acute presentation with potential threat to life or bodily function.   MEDICATIONS ORDERED IN ED: Medications  cefTRIAXone (ROCEPHIN) 1 g in sodium chloride 0.9 % 100 mL IVPB (1 g Intravenous New Bag/Given 11/28/22 0009)  iohexol (OMNIPAQUE) 300 MG/ML solution 100 mL (100 mLs Intravenous Contrast Given 11/27/22 2235)    FINAL CLINICAL IMPRESSION(S) / ED DIAGNOSES   Final diagnoses:  Acute cystitis without hematuria     Rx / DC Orders   ED Discharge Orders          Ordered    cephALEXin (KEFLEX) 500 MG capsule  2 times daily        11/28/22 0008             Note:  This document was prepared using Dragon voice recognition software and may include unintentional dictation errors.   Corena Herter, MD 11/28/22 0010

## 2022-11-27 NOTE — ED Notes (Signed)
Pt ambulated to commode. Unable to provide stool sample.

## 2022-11-27 NOTE — ED Provider Triage Note (Signed)
Emergency Medicine Provider Triage Evaluation Note  [name] , a 82 y.o. female  was evaluated in triage.  Pt complains of abdominal pain and diarrhea x 2 weeks. No fever. No nausea or vomiting.  Physical Exam  BP (!) 167/74   Pulse 61   Temp 98 F (36.7 C) (Oral)   Resp 16   Ht 5\' 6"  (1.676 m)   Wt 90.9 kg   SpO2 98%   BMI 32.35 kg/m  Gen:   Awake, no distress   Resp:  Normal effort  MSK:   Moves extremities without difficulty  Other:  No rebound tenderness.  Medical Decision Making  Medically screening exam initiated at 5:21 PM.  Appropriate orders placed.  [name] was informed that the remainder of the evaluation will be completed by another provider, this initial triage assessment does not replace that evaluation, and the importance of remaining in the ED until their evaluation is complete.     Chinita Pester, FNP 11/27/22 1722

## 2022-11-27 NOTE — ED Triage Notes (Signed)
Lower abd pain and diarrhea x 2 weeks radiates to right hip. Denies vomiting. Denies fever.

## 2022-11-28 ENCOUNTER — Other Ambulatory Visit
Admission: RE | Admit: 2022-11-28 | Discharge: 2022-11-28 | Disposition: A | Discharge status: Home / Self Care | Payer: PPO | Source: Ambulatory Visit | Attending: Emergency Medicine | Admitting: Emergency Medicine

## 2022-11-28 DIAGNOSIS — R109 Unspecified abdominal pain: Secondary | ICD-10-CM | POA: Insufficient documentation

## 2022-11-28 LAB — C DIFFICILE QUICK SCREEN W PCR REFLEX
C Diff antigen: NEGATIVE
C Diff interpretation: NOT DETECTED
C Diff toxin: NEGATIVE

## 2022-11-28 MED ORDER — CEPHALEXIN 500 MG PO CAPS: 500.0000 mg | ORAL_CAPSULE | Freq: Two times a day (BID) | ORAL | 0 refills | Status: AC

## 2022-11-28 MED ORDER — SODIUM CHLORIDE 0.9 % IV SOLN
1.0000 g | Freq: Once | INTRAVENOUS | Status: AC
  Administered 2022-11-28: 1 g via INTRAVENOUS
  Filled 2022-11-28: qty 10

## 2022-11-28 NOTE — Discharge Instructions (Signed)
You are seen in the emergency department for abdominal pain.  You had a CT scan done that did not show any findings to explain your abdominal pain today.  You did have findings of urinary tract infection.  You are given 1 dose of IV antibiotics in the emergency department.  Take your next dose of antibiotics tomorrow morning.  Your urine was sent for culture.  Call your primary care physician first thing in the morning to schedule close follow-up appointment.  You are unable to provide a stool sample in the emergency department.  Follow-up with your primary care physician, they may want to do C. difficile testing.  Stay hydrated and drink plenty of fluids.  Return to the emergency department for any fever or worsening symptoms.

## 2022-11-29 ENCOUNTER — Ambulatory Visit: Payer: PPO | Admitting: Cardiovascular Disease

## 2022-11-29 ENCOUNTER — Ambulatory Visit: Payer: PPO

## 2022-11-29 LAB — URINE CULTURE

## 2022-11-30 ENCOUNTER — Telehealth: Payer: Self-pay | Admitting: Pulmonary Disease

## 2022-11-30 ENCOUNTER — Encounter: Payer: Self-pay | Admitting: Family Medicine

## 2022-11-30 ENCOUNTER — Ambulatory Visit (INDEPENDENT_AMBULATORY_CARE_PROVIDER_SITE_OTHER): Payer: PPO | Admitting: Family Medicine

## 2022-11-30 VITALS — BP 120/70 | HR 74 | Temp 98.3°F | Resp 16 | Ht 66.0 in | Wt 198.0 lb

## 2022-11-30 DIAGNOSIS — K580 Irritable bowel syndrome with diarrhea: Secondary | ICD-10-CM

## 2022-11-30 DIAGNOSIS — R350 Frequency of micturition: Secondary | ICD-10-CM

## 2022-11-30 DIAGNOSIS — N3001 Acute cystitis with hematuria: Secondary | ICD-10-CM

## 2022-11-30 NOTE — Progress Notes (Signed)
SUBJECTIVE:   Chief Complaint  Patient presents with   Abdominal Pain   Bloated   Diarrhea   HPI Patient presents to clinic for follow-up ED visit.  Was recently seen at [facility] on 11/27/2022 for acute cystitis.  Urine showed trace of hemoglobin and small leukocytes.  Culture was obtained which recommended recollection given multiple species present.  Her exam at that time showed distention of abdomen with abdominal tenderness and therefore CT abdomen was obtained which showed diverticulosis but no acute diverticulitis.  There was a moderate-sized hiatal hernia.  No signs of acute intra-abdominal pathology was noted.  She was given dose of IV Rocephin and continued on Keflex.  Today she presents and reports continued abdominal bloating and notes that diarrhea has improved.  She remains on Keflex 500 twice daily and will complete her course of treatment in 2 days.  Denies any fevers, worsening urinary symptoms, bloody stool or hematuria.  Abdominal pain has decreased however still has some bloating.   PERTINENT PMH / PSH: Hypertension IBS primarily diarrhea Diverticulosis   OBJECTIVE:  BP 120/70   Pulse 74   Temp 98.3 F (36.8 C)   Resp 16   Ht 5\' 6"  (1.676 m)   Wt 198 lb (89.8 kg)   SpO2 97%   BMI 31.96 kg/m    Physical Exam Vitals reviewed.  Constitutional:      General: She is not in acute distress.    Appearance: Normal appearance. She is normal weight. She is not ill-appearing, toxic-appearing or diaphoretic.  HENT:     Mouth/Throat:     Mouth: Mucous membranes are moist.  Eyes:     General:        Right eye: No discharge.        Left eye: No discharge.     Conjunctiva/sclera: Conjunctivae normal.  Cardiovascular:     Rate and Rhythm: Normal rate and regular rhythm.     Heart sounds: Normal heart sounds.  Pulmonary:     Effort: Pulmonary effort is normal.     Breath sounds: Normal breath sounds.  Abdominal:     General: Bowel sounds are normal. There is no  distension or abdominal bruit.     Tenderness: There is no abdominal tenderness. There is no right CVA tenderness, left CVA tenderness, guarding or rebound.  Musculoskeletal:        General: Normal range of motion.  Skin:    General: Skin is warm and dry.  Neurological:     General: No focal deficit present.     Mental Status: She is alert and oriented to person, place, and time. Mental status is at baseline.  Psychiatric:        Mood and Affect: Mood normal.        Behavior: Behavior normal.        Thought Content: Thought content normal.        Judgment: Judgment normal.     ASSESSMENT/PLAN:  Acute cystitis with hematuria Assessment & Plan: Improving.  Currently treated with Keflex.   Continue Keflex 500 mg twice daily until completion of treatment as prescribed by ED provider Repeat urine culture  Start probiotics daily Follow-up if symptoms do not improve    Orders: -     Urine Culture  Irritable bowel syndrome with diarrhea Assessment & Plan: Recent CT abdomen did not show acute pathology.  Diverticulosis noted however no diverticulitis.Recent C. difficile negative. Diarrhea improving. No fevers, and no acute abdomen on exam. Abdominal bloating  and diarrhea likely secondary to antibiotic use. Start probiotics daily while on antibiotics Follow-up with symptoms worsen.     PDMP reviewed  Return if symptoms worsen or fail to improve.  Dana Allan, MD

## 2022-11-30 NOTE — Patient Instructions (Addendum)
It was a pleasure meeting you today. Thank you for allowing me to take part in your health care.  Our goals for today as we discussed include:  Continue current antibiotics as previously prescribed. Start probiotics daily and continue for 2 weeks after completion of antibiotics  Recheck urine today Will notify you of results.  Follow-up with GI as scheduled   Can take Imodium 2 mg as needed    Follow-up as needed  If you have any questions or concerns, please do not hesitate to call the office at [phone].  I look forward to our next visit and until then take care and stay safe.  Regards,   Dana Allan, MD   [facility]

## 2022-11-30 NOTE — Telephone Encounter (Signed)
See last encounter. PT is back from beach vacation and can set up a PFT and FU appt/. Call when we can for something in June as she has been sick. TY>

## 2022-12-01 ENCOUNTER — Ambulatory Visit: Payer: PPO | Admitting: Nurse Practitioner

## 2022-12-01 LAB — URINE CULTURE
MICRO NUMBER:: 14960044
Result:: NO GROWTH
SPECIMEN QUALITY:: ADEQUATE

## 2022-12-05 ENCOUNTER — Ambulatory Visit: Payer: PPO | Admitting: Pulmonary Disease

## 2022-12-05 ENCOUNTER — Telehealth: Payer: Self-pay | Admitting: Family Medicine

## 2022-12-05 NOTE — Telephone Encounter (Signed)
Left message to call the office back regarding her phone message.

## 2022-12-05 NOTE — Telephone Encounter (Signed)
Patient contacted the office and requested a transfer of care appointment from our office. Wants to see Dr. Ermalene Searing specifically, informed patient that she is not currently accepting new patients. Patient asked if a message could be sent to Dr. Ermalene Searing asking if she could take her on as anew patient. Patient states Tonya from [facility] wound care recommended her highly to Dr. Ermalene Searing. Please advise, thank you

## 2022-12-05 NOTE — Telephone Encounter (Signed)
Patient can't get into her my chart . She stated that she received a my chart notice. She is asking about a 3rd medication that was prescribed for her UTI by Dr. Clent Ridges. She also stated the ED informed her she had a UTI on 11/27/22. However, Her lab work that was done here stated she was negative.

## 2022-12-06 NOTE — Telephone Encounter (Signed)
Let her know unfortunately I am not accepting new patients at this time.

## 2022-12-07 NOTE — Telephone Encounter (Signed)
LMOM to CB. 

## 2022-12-07 NOTE — Telephone Encounter (Signed)
Left voicemail to return call to the office.

## 2022-12-09 NOTE — Telephone Encounter (Signed)
Called patient today, relayed Dr. Daphine Deutscher message. Patient said she was on her way somewhere and could not talk at the moment and asked to call back later.

## 2022-12-09 NOTE — Telephone Encounter (Signed)
Left voicemail to return call-third attempt. No other attempts will be made to reach patient.

## 2022-12-12 ENCOUNTER — Emergency Department: Payer: PPO

## 2022-12-12 ENCOUNTER — Other Ambulatory Visit: Payer: Self-pay

## 2022-12-12 ENCOUNTER — Emergency Department
Admission: EM | Admit: 2022-12-12 | Discharge: 2022-12-12 | Disposition: A | Discharge status: Home / Self Care | Payer: PPO | Attending: Emergency Medicine | Admitting: Emergency Medicine

## 2022-12-12 DIAGNOSIS — S0990XA Unspecified injury of head, initial encounter: Secondary | ICD-10-CM | POA: Diagnosis not present

## 2022-12-12 DIAGNOSIS — Y92003 Bedroom of unspecified non-institutional (private) residence as the place of occurrence of the external cause: Secondary | ICD-10-CM | POA: Diagnosis not present

## 2022-12-12 DIAGNOSIS — R519 Headache, unspecified: Secondary | ICD-10-CM | POA: Insufficient documentation

## 2022-12-12 DIAGNOSIS — S51812A Laceration without foreign body of left forearm, initial encounter: Secondary | ICD-10-CM | POA: Insufficient documentation

## 2022-12-12 DIAGNOSIS — S41112A Laceration without foreign body of left upper arm, initial encounter: Secondary | ICD-10-CM | POA: Diagnosis not present

## 2022-12-12 DIAGNOSIS — W19XXXA Unspecified fall, initial encounter: Secondary | ICD-10-CM

## 2022-12-12 DIAGNOSIS — M25422 Effusion, left elbow: Secondary | ICD-10-CM | POA: Diagnosis not present

## 2022-12-12 MED ORDER — BACITRACIN ZINC 500 UNIT/GM EX OINT
TOPICAL_OINTMENT | Freq: Once | CUTANEOUS | Status: AC
  Filled 2022-12-12: qty 0.9

## 2022-12-12 MED ORDER — LIDOCAINE HCL (PF) 1 % IJ SOLN
10.0000 mL | Freq: Once | INTRAMUSCULAR | Status: AC
  Administered 2022-12-12: 10 mL
  Filled 2022-12-12: qty 10
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
## 2022-12-12 NOTE — ED Notes (Signed)
Bacitracin applied to arm with Xeroform and gauze.

## 2022-12-12 NOTE — Discharge Instructions (Addendum)
Gently wash the wound with soap and water.  It is okay to shower, but do not submerge in a bath or go swimming as it is healing.  Do not vigorously scrub.   Gently pat dry.   Once dry, then apply Neosporin or bacitracin or even Vaseline ointment to the area to act as a barrier to help prevent infection.   Use Tylenol for pain and fevers.  Up to 1000 mg per dose, up to 4 times per day.  Do not take more than 4000 mg of Tylenol/acetaminophen within 24 hours.Marland Kitchen

## 2022-12-13 NOTE — Telephone Encounter (Signed)
Pt returned Latavia CMA call. Transferred. 

## 2022-12-13 NOTE — Telephone Encounter (Signed)
Spoke with pt and she stated she went to the ER yesterday for falling out of bed during a nightmare and she hit her head, notes are available in chart.    As far as the UTI pt stated that off and on she was feeling uncomfortable when she urinated not necessarily a burning sensation just felt uncomfortable towards the end pt wants to know if some meds can be sent in or if she needs to do more urine. She stated it was not all day she still did  not feel 100% better

## 2022-12-13 NOTE — Telephone Encounter (Addendum)
Patient returned [name]'s  phone call. Patient stated she only wanted to speak to her.

## 2022-12-13 NOTE — Telephone Encounter (Signed)
LMOM to CB. 

## 2022-12-14 NOTE — Telephone Encounter (Signed)
Spoke with patient. She denies any continued urinary symptoms. Explained/reviewed urine culture and negative result. Patient voiced understanding.   Patient also wanted to make sure provider aware she was seen in ED for fall, has stitches in her arm. Advised provider is aware. Patient states she is doing well, was advised stitches would dissolve; does not see need for ROV here. Advised patient to keep site clean/dry and monitor for signs of infection, if any noticed will need follow up ASAP. Patient voiced understanding. Nothing further needed at this time.

## 2022-12-17 NOTE — Assessment & Plan Note (Addendum)
Improving.  Currently treated with Keflex.   Continue Keflex 500 mg twice daily until completion of treatment as prescribed by ED provider Repeat urine culture  Start probiotics daily Follow-up if symptoms do not improve

## 2022-12-17 NOTE — Assessment & Plan Note (Signed)
Recent CT abdomen did not show acute pathology.  Diverticulosis noted however no diverticulitis.Recent C. difficile negative. Diarrhea improving. No fevers, and no acute abdomen on exam. Abdominal bloating and diarrhea likely secondary to antibiotic use. Start probiotics daily while on antibiotics Follow-up with symptoms worsen.

## 2022-12-19 ENCOUNTER — Telehealth: Payer: Self-pay

## 2022-12-19 NOTE — Telephone Encounter (Signed)
Transition Care Management Unsuccessful Follow-up Telephone Call  Date of discharge and from where:  12/12/2022 [facility]  Attempts:  1st Attempt  Reason for unsuccessful TCM follow-up call:  Left voice message  [name] [facility]  [facility] Population Health Community Resource Care Guide   [email or web address]  ?? [phone]   Website: [email or web address]  [email or web address]

## 2022-12-20 ENCOUNTER — Telehealth: Payer: Self-pay

## 2022-12-20 NOTE — Telephone Encounter (Signed)
Transition Care Management Unsuccessful Follow-up Telephone Call  Date of discharge and from where:  12/12/2022 [facility]  Attempts:  2nd Attempt  Reason for unsuccessful TCM follow-up call:  Left voice message  [name]  [facility] Population Health Community Resource Care Guide   [email or web address]  ?? [phone]   Website: [email or web address]  [email or web address]

## 2022-12-21 ENCOUNTER — Encounter: Payer: Self-pay | Admitting: Gastroenterology

## 2022-12-21 ENCOUNTER — Ambulatory Visit: Payer: PPO | Admitting: Gastroenterology

## 2022-12-21 VITALS — BP 140/70 | HR 64 | Ht 63.75 in | Wt 197.2 lb

## 2022-12-21 DIAGNOSIS — R32 Unspecified urinary incontinence: Secondary | ICD-10-CM

## 2022-12-21 DIAGNOSIS — R159 Full incontinence of feces: Secondary | ICD-10-CM

## 2022-12-21 DIAGNOSIS — K529 Noninfective gastroenteritis and colitis, unspecified: Secondary | ICD-10-CM | POA: Diagnosis not present

## 2022-12-21 MED ORDER — LOPERAMIDE HCL 2 MG PO TABS: 2.0000 mg | ORAL_TABLET | Freq: Every day | ORAL | 0 refills | Status: DC

## 2022-12-21 MED ORDER — COLESTIPOL HCL 1 G PO TABS: 1.0000 g | ORAL_TABLET | Freq: Two times a day (BID) | ORAL | 3 refills | Status: DC

## 2022-12-21 NOTE — Patient Instructions (Addendum)
We have sent the following medications to your pharmacy for you to pick up at your convenience: Colestid  Please purchase the following medications over the counter and take as directed: Imodium: Take once daily  We are referring you to [facility].  They will contact you directly to schedule an appointment.  It may take a week or more before you hear from them.  Please feel free to contact us if you have not heard from them within 2 weeks and we will follow up on the referral.   Thank you for entrusting me with your care and for choosing [facility], Dr. Ileene Patrick   If your blood pressure at your visit was 140/90 or greater, please contact your primary care physician to follow up on this. ______________________________________________________  If you are age 82 or older, your body mass index should be between 23-30. Your Body mass index is 34.12 kg/m. If this is out of the aforementioned range listed, please consider follow up with your Primary Care Provider.  If you are age 82 or younger, your body mass index should be between 19-25. Your Body mass index is 34.12 kg/m. If this is out of the aformentioned range listed, please consider follow up with your Primary Care Provider.  ________________________________________________________  The [facility] providers would like to encourage you to use [facility] to communicate with providers for non-urgent requests or questions.  Due to long hold times on the telephone, sending your provider a message by [facility] may be a faster and more efficient way to get a response.  Please allow 48 business hours for a response.  Please remember that this is for non-urgent requests.  _______________________________________________________  Due to recent changes in healthcare laws, you may see the results of your imaging and laboratory studies on MyChart before your provider has had a chance to review them.  We understand that in  some cases there may be results that are confusing or concerning to you. Not all laboratory results come back in the same time frame and the provider may be waiting for multiple results in order to interpret others.  Please give [name] 48 hours in order for your provider to thoroughly review all the results before contacting the office for clarification of your results.

## 2022-12-21 NOTE — Progress Notes (Signed)
HPI :  82 year old female here for a follow-up visit for chronic diarrhea postcholecystectomy.  I have not seen her since November 2022.  Recall that she had her last colonoscopy in 2014 by [facility].  She had to be admitted to the hospital for pain after the procedure and concern for perforation, she was told she should never have a colonoscopy again.  She is wanting to avoid colonoscopies historically if at all possible. Had a virtual colonoscopy in August 2021.  No obvious polyps or mass lesions noted, she does have suboptimal distention of the sigmoid colon with diverticulosis.    She has had her cholecystectomy a long time ago and she had loose stools perhaps even before that but definitely worse since she had a cholecystectomy.  She is tested negative for celiac disease, had stool studies evaluation in the past.  We placed her on colestipol 1 g twice daily since I last saw her.  She states that definitely made a difference and controls her symptoms better but does not resolve them entirely.  She typically has a few bowel movements per day in the morning.  She is still has occasional loose stools with some leakage of stools and incontinence.  She states she has severe urinary incontinence and these tend to go together.  She is never seen pelvic floor PT or had treatment for urinary incontinence or fecal incontinence before.  She uses Imodium as needed not routinely.  Of note she had some slightly elevated triglycerides on her most recent set of labs, followed by Dr. Clent Ridges.  Of note she had some lower abdominal pain in recent months, was diagnosed with a UTI.  She has had a few CT scans, the last done on May 12 when she had a fall.  There was no concerning pathology in her abdomen.   Prior work-up CT scan 07/26/2017 IMPRESSION: 1. No acute process in the abdomen or pelvis. No evidence of metastatic disease in the abdomen or pelvis. 2.  Aortic Atherosclerosis (ICD10-I70.0). 3. Pelvic floor  laxity. 4. Hiatal hernia.     Virtual colonoscopy 02/25/2020 - IMPRESSION: 1. Suboptimal evaluation of the sigmoid, secondary to underdistention. No circumferential mass within this region. More proximally, no evidence of clinically significant colonic polyp or mass. 2. Small hiatal hernia. 3. Aortic Atherosclerosis (ICD10-I70.0).   CT renal stone study 08/18/22: IMPRESSION: Extensive colonic diverticulosis without evidence of diverticulitis.  Moderate-sized hiatal hernia. Umbilical hernia containing fat. No acute intra-abdominal or intrapelvic abnormalities. Aortic Atherosclerosis (ICD10-I70.0).   CT abdomen / pelvis 11/27/22: IMPRESSION: 1. No acute localizing process in the abdomen or pelvis. 2. Colonic diverticulosis without evidence for diverticulitis. 3. Moderate-sized hiatal hernia.       Past Medical History:  Diagnosis Date   Acid reflux    Allergy    Anemia    Anxiety    in past   Arthritis    all over   Asthma    Atypical moles    Bronchitis    Bronchospasm    reactive with perfume/cologne and smoke   CAD (coronary artery disease)    patient denies on preop of 10/24    Candida esophagitis (HCC)    Carotid artery calcification, bilateral 04/2016   Carpal tunnel syndrome of left wrist 12/03/2019   Cataract    Cervical stenosis of spine    Chronic insomnia    Complication of anesthesia    shoulder surgery- not all the way asleep   COVID-19 02/04/2021   also 10/3  or 04/20/21 sxs started 04/05/21   Depression    in past   Diverticulitis    Diverticulosis    Dizziness    Elevated liver enzymes    Family history of adverse reaction to anesthesia    sister- has problems with nausea and vomiting    Fatigue    Fatty liver    Generalized headache    migraines   GERD (gastroesophageal reflux disease)    Gross hematuria 08/18/2022   Hereditary and idiopathic peripheral neuropathy 05/11/2015   History of chemotherapy    topical cream for h/o skin CA    History of kidney stones    History of neuropathy    FH Dr. Allena Katz neurology   History of vertigo    Hx of migraines    Hyperlipidemia    Hypothyroidism    Insomnia    Irritable bowel syndrome    LLQ abdominal pain    Melanoma (HCC) 2013   Melanoma (HCC)    x2 stg II melanoma   Neuropathy, peripheral    Peripheral neuropathy    Pneumonia    hx of x 2   PONV (postoperative nausea and vomiting)    Pre-diabetes    Retina disorder    left   SCC (squamous cell carcinoma)    skin    Skin cancer 07/17/2019   SCC left leg s/p mohs   SCC 05/01/18 left medial mid leg, right post mid leg 06/08/18, left mid well 04/10/19  Dr. Emily Filbert derm, Dr. Glennis Brink mohs       Stroke [facility])    ? TIA per Dr V per patient    Thyroid disease    Urine incontinence    Viral URI with cough 06/09/2022   Vitamin D deficiency    Weight gain      Past Surgical History:  Procedure Laterality Date   ABLATION Bilateral    Dr. Wyn Quaker 12/2020 veins laser ablation of both great saphenous veins in a staged fashion.   BILATERAL SALPINGOOPHORECTOMY     ovarian cyst Dr. Dareen Piano and Dr. Allie Dimmer 2008   BREAST SURGERY     in 2014 bx breast calcifications   BREAST SURGERY     augmentation with saline Dr. Karmen Bongo 1995    CHOLECYSTECTOMY N/A 01/28/2015   Procedure: LAPAROSCOPIC CHOLECYSTECTOMY WITH INTRAOPERATIVE CHOLANGIOGRAM;  Surgeon: Chevis Pretty III, MD;  Location: [facility] OR;  Service: General;  Laterality: N/A;   CLOSED MANIPULATION SHOULDER     rt   COLONOSCOPY WITH PROPOFOL N/A 11/20/2012   Procedure: COLONOSCOPY WITH PROPOFOL;  Surgeon: Charolett Bumpers, MD;  Location: [facility] ENDOSCOPY;  Service: Endoscopy;  Laterality: N/A;   colonscopy     May 2014   JOINT REPLACEMENT     RTK   KNEE ARTHROSCOPY     left knee miniscus tear repair     MELANOMA EXCISION WITH SENTINEL LYMPH NODE BIOPSY  03/18/2011   Back, nodes neg   OOPHORECTOMY     ROTATOR CUFF REPAIR     left   SHOULDER SURGERY     right and left     SKIN CANCER EXCISION     multiple   TOTAL HIP ARTHROPLASTY     TOTAL KNEE ARTHROPLASTY     right 2010 and left in 2018    TOTAL KNEE ARTHROPLASTY Left 05/16/2017   Procedure: LEFT TOTAL KNEE ARTHROPLASTY;  Surgeon: Durene Romans, MD;  Location: [facility] ORS;  Service: Orthopedics;  Laterality: Left;  70 mins   TUBAL  LIGATION     TUMOR REMOVAL     ovary   Family History  Problem Relation Age of Onset   Hypertension Mother    Stroke Mother    Hyperlipidemia Mother    Heart disease Father    Diabetes Father    Hypertension Father    Hyperlipidemia Father    Early death Father    Diabetes Sister    Heart disease Sister        heart murmur   Arthritis Sister    COPD Sister    Hyperlipidemia Sister    Hypertension Sister    Colon polyps Sister        adenomous   Arthritis Sister    Heart disease Sister    Hyperlipidemia Sister    Hypertension Sister    Cancer Sister    Dementia Sister    Tremor Sister    Stroke Maternal Grandmother    Colon cancer Maternal Grandfather    Cancer Maternal Grandfather    Early death Maternal Grandfather    Early death Paternal Grandmother    Stroke Paternal Grandmother    Heart disease Daughter    Hyperlipidemia Daughter    Hypertension Daughter    Heart disease Son    Hyperlipidemia Son    Alcohol abuse Son    Depression Son    Diabetes Son    Pancreatic cancer Maternal Uncle    Alzheimer's disease Paternal Aunt    Alzheimer's disease Paternal Aunt    Alzheimer's disease Paternal Aunt    Alzheimer's disease Paternal Aunt    Cancer Other        2 paternal uncles and 1 maternal uncle colon cancer   Esophageal cancer Neg Hx    Stomach cancer Neg Hx    Social History   Tobacco Use   Smoking status: Never   Smokeless tobacco: Never  Vaping Use   Vaping Use: Never used  Substance Use Topics   Alcohol use: Yes    Alcohol/week: 1.0 standard drink of alcohol    Types: 1 Cans of beer per week    Comment: once a week beer or wine    Drug use: No   Current Outpatient Medications  Medication Sig Dispense Refill   albuterol (PROAIR HFA) 108 (90 Base) MCG/ACT inhaler Inhale 1-2 puffs into the lungs every 6 (six) hours as needed for wheezing or shortness of breath. 18 g 11   aspirin 81 MG tablet TAKE 1 TABLET BY MOUTH EVERY DAY     atorvastatin (LIPITOR) 20 MG tablet Take 1 tablet (20 mg total) by mouth daily. 90 tablet 3   Biotin 1000 MCG tablet Take 1,000 mcg by mouth daily.     BREO ELLIPTA 100-25 MCG/ACT AEPB Inhale 1 puff into the lungs daily. 28 each 11   Calcium 600-200 MG-UNIT tablet Take 1 tablet daily by mouth.     Cholecalciferol (VITAMIN D3) 2000 UNITS capsule Take 2,000 Units by mouth daily.     citalopram (CELEXA) 20 MG tablet Take 1 tablet (20 mg total) by mouth daily. 90 tablet 3   colestipol (COLESTID) 1 g tablet Take 1 tablet (1 g total) by mouth 2 (two) times daily. Please keep your July appointment for further refills. 60 tablet 2   FOLIC ACID PO Take by mouth daily.     levothyroxine (SYNTHROID) 75 MCG tablet Take 1 tablet (75 mcg total) by mouth daily before breakfast. 90 tablet 3   Magnesium 250 MG TABS Take by mouth.  olmesartan (BENICAR) 20 MG tablet Take 0.5 tablets (10 mg total) by mouth in the morning and at bedtime. D/c 5 mg (10 mg bid of 5 mg) 90 tablet 3   Omega-3 Fatty Acids (FISH OIL) 1000 MG CAPS Take 1,000 mg by mouth daily.      omeprazole (PRILOSEC) 20 MG capsule Take 1 capsule (20 mg total) by mouth 2 (two) times daily before a meal. 180 capsule 3   Potassium (POTASSIMIN PO) Take by mouth.     saccharomyces boulardii (FLORASTOR) 250 MG capsule Take 1 capsule (250 mg total) by mouth daily. 90 capsule 0   tiZANidine (ZANAFLEX) 2 MG tablet Take 1 tablet (2 mg total) by mouth at bedtime as needed for muscle spasms. 30 tablet 2   vitamin B-12 (CYANOCOBALAMIN) 1000 MCG tablet Take 2,000 mcg by mouth daily. 2 tabs     No current facility-administered medications for this visit.   Allergies   Allergen Reactions   Epinephrine Other (See Comments) and Anaphylaxis    Heart palpitations   Penicillins Swelling and Other (See Comments)    Lip swelling Has patient had a PCN reaction causing immediate rash, facial/tongue/throat swelling, SOB or lightheadedness with hypotension: Yes Has patient had a PCN reaction causing severe rash involving mucus membranes or skin necrosis: No Has patient had a PCN reaction that required hospitalization No Has patient had a PCN reaction occurring within the last 10 years: No If all of the above answers are "NO", then may proceed with Cephalosporin use.   Other     Black pepper- caused bronchial tubes to start closing    Valium [Diazepam] Other (See Comments)    Other reaction(s): Other (See Comments) Overly sensitive  sedation     Review of Systems: All systems reviewed and negative except where noted in HPI.   Lab Results  Component Value Date   WBC 6.1 11/27/2022   HGB 12.6 11/27/2022   HCT 40.2 11/27/2022   MCV 93.7 11/27/2022   PLT 215 11/27/2022    Lab Results  Component Value Date   CREATININE 1.01 (H) 11/27/2022   BUN 16 11/27/2022   NA 138 11/27/2022   K 3.6 11/27/2022   CL 103 11/27/2022   CO2 25 11/27/2022    Lab Results  Component Value Date   ALT 16 11/27/2022   AST 18 11/27/2022   ALKPHOS 79 11/27/2022   BILITOT 1.6 (H) 11/27/2022     Physical Exam: BP (!) 140/70 (BP Location: Right Arm, Patient Position: Sitting, Cuff Size: Large)   Pulse 64   Ht 5' 3.75" (1.619 m)   Wt 197 lb 4 oz (89.5 kg)   BMI 34.12 kg/m  Constitutional: Pleasant,well-developed, female in no acute distress. Neurological: Alert and oriented to person place and time. Psychiatric: Normal mood and affect. Behavior is normal.   ASSESSMENT: 82 y.o. female here for assessment of the following  1. Chronic diarrhea   2. Incontinence of feces, unspecified fecal incontinence type   3. Urinary incontinence, unspecified type    I think  in part due to cholecystectomy, clearly Colestid has provided some good benefit for her although not resolved entirely and we discussed options.  She is tolerating Colestid twice daily although her triglycerides are slightly elevated.  Could consider increasing to 2 g twice daily if needed but will continue present dosing for now, recommended adding Imodium once daily to her regimen and titrate that up as needed.  Hopefully that can minimize her symptoms.  She should  follow-up with her primary care for monitoring of her triglycerides over time on this regimen.  She has both urinary and fecal incontinence, likely weak pelvic floor.  I think she would benefit from pelvic floor PT and will refer her there further evaluation and treatment.  She is agreeable with that.  Otherwise, given issues with her prior colonoscopies we will avoid future colonoscopy exams if at all possible.  Virtual colonoscopy looked okay, recent CT scan shows no concerning lesions in her colon.  Reassured her.  PLAN: - continue colestid - 1gm BID - refill. She should have trigs evaluated yearly by PCP - add immodium once daily to regimen - refer to pelvic floor PT for incontinence - avoiding further colonoscopy at this time given issues with her prior exams as outlined.  Harlin Rain, MD [facility]

## 2022-12-22 ENCOUNTER — Ambulatory Visit (INDEPENDENT_AMBULATORY_CARE_PROVIDER_SITE_OTHER): Payer: PPO | Admitting: Nurse Practitioner

## 2022-12-22 ENCOUNTER — Encounter: Payer: Self-pay | Admitting: Nurse Practitioner

## 2022-12-22 VITALS — BP 128/78 | HR 90 | Temp 97.9°F | Ht 66.0 in | Wt 197.2 lb

## 2022-12-22 DIAGNOSIS — S51012D Laceration without foreign body of left elbow, subsequent encounter: Secondary | ICD-10-CM

## 2022-12-22 DIAGNOSIS — W19XXXD Unspecified fall, subsequent encounter: Secondary | ICD-10-CM

## 2022-12-22 DIAGNOSIS — S8000XD Contusion of unspecified knee, subsequent encounter: Secondary | ICD-10-CM | POA: Diagnosis not present

## 2022-12-22 NOTE — Patient Instructions (Addendum)
Continue to use Neosporin/Vaseline ointment to the laceration.  Apply ice to the bruises on the knee.  Schedule an appointment with Ortho regarding trigger thumb.  Call the Medicare advantage plan to check for medical alert bracelet coverage

## 2022-12-22 NOTE — Progress Notes (Signed)
Established Patient Office Visit  Subjective:  Patient ID: [name], female    DOB: 06-25-1941  Age: 82 y.o. MRN: [id number]  CC:  Chief Complaint  Patient presents with   Follow-up    ED    HPI  [name] presents for ED follow-up. She had a fall  from the bed and hit her head on 12/12/2022.  She had a laceration on her elbow.  The x-ray of the left elbow showed no sign of dislocation or fracture.  The CT of the head in ED negative for acute injury.  The CT of the head show chronic microvascular ischemic changes. She had brising on the knee from the fall.   She is overall doing well no complaints at present. HPI   Past Medical History:  Diagnosis Date   Acid reflux    Allergy    Anemia    Anxiety    in past   Arthritis    all over   Asthma    Atypical moles    Bronchitis    Bronchospasm    reactive with perfume/cologne and smoke   CAD (coronary artery disease)    patient denies on preop of 10/24    Candida esophagitis (HCC)    Carotid artery calcification, bilateral 04/2016   Carpal tunnel syndrome of left wrist 12/03/2019   Cataract    Cervical stenosis of spine    Chronic insomnia    Complication of anesthesia    shoulder surgery- not all the way asleep   COVID-19 02/04/2021   also 10/3 or 04/20/21 sxs started 04/05/21   Depression    in past   Diverticulitis    Diverticulosis    Dizziness    Elevated liver enzymes    Family history of adverse reaction to anesthesia    sister- has problems with nausea and vomiting    Fatigue    Fatty liver    Generalized headache    migraines   GERD (gastroesophageal reflux disease)    Gross hematuria 08/18/2022   Hereditary and idiopathic peripheral neuropathy 05/11/2015   History of chemotherapy    topical cream for h/o skin CA   History of kidney stones    History of neuropathy    FH Dr. Allena Katz neurology   History of vertigo    Hx of migraines    Hyperlipidemia    Hypothyroidism    Insomnia     Irritable bowel syndrome    LLQ abdominal pain    Melanoma (HCC) 2013   Melanoma (HCC)    x2 stg II melanoma   Neuropathy, peripheral    Peripheral neuropathy    Pneumonia    hx of x 2   PONV (postoperative nausea and vomiting)    Pre-diabetes    Retina disorder    left   SCC (squamous cell carcinoma)    skin    Skin cancer 07/17/2019   SCC left leg s/p mohs   SCC 05/01/18 left medial mid leg, right post mid leg 06/08/18, left mid well 04/10/19  Dr. Emily Filbert derm, Dr. Glennis Brink mohs       Stroke [facility]    ? TIA per Dr V per patient    Thyroid disease    Urine incontinence    Viral URI with cough 06/09/2022   Vitamin D deficiency    Weight gain     Past Surgical History:  Procedure Laterality Date   ABLATION Bilateral    Dr. Wyn Quaker 12/2020  veins laser ablation of both great saphenous veins in a staged fashion.   BILATERAL SALPINGOOPHORECTOMY     ovarian cyst Dr. Dareen Piano and Dr. Allie Dimmer 2008   BREAST SURGERY     in 2014 bx breast calcifications   BREAST SURGERY     augmentation with saline Dr. Karmen Bongo 1995    CHOLECYSTECTOMY N/A 01/28/2015   Procedure: LAPAROSCOPIC CHOLECYSTECTOMY WITH INTRAOPERATIVE CHOLANGIOGRAM;  Surgeon: Chevis Pretty III, MD;  Location: [facility] OR;  Service: General;  Laterality: N/A;   CLOSED MANIPULATION SHOULDER     rt   COLONOSCOPY WITH PROPOFOL N/A 11/20/2012   Procedure: COLONOSCOPY WITH PROPOFOL;  Surgeon: Charolett Bumpers, MD;  Location: [facility] ENDOSCOPY;  Service: Endoscopy;  Laterality: N/A;   colonscopy     May 2014   JOINT REPLACEMENT     RTK   KNEE ARTHROSCOPY     left knee miniscus tear repair     MELANOMA EXCISION WITH SENTINEL LYMPH NODE BIOPSY  03/18/2011   Back, nodes neg   OOPHORECTOMY     ROTATOR CUFF REPAIR     left   SHOULDER SURGERY     right and left    SKIN CANCER EXCISION     multiple   TOTAL HIP ARTHROPLASTY     TOTAL KNEE ARTHROPLASTY     right 2010 and left in 2018    TOTAL KNEE ARTHROPLASTY Left 05/16/2017   Procedure:  LEFT TOTAL KNEE ARTHROPLASTY;  Surgeon: Durene Romans, MD;  Location: [facility] ORS;  Service: Orthopedics;  Laterality: Left;  70 mins   TUBAL LIGATION     TUMOR REMOVAL     ovary    Family History  Problem Relation Age of Onset   Hypertension Mother    Stroke Mother    Hyperlipidemia Mother    Heart disease Father    Diabetes Father    Hypertension Father    Hyperlipidemia Father    Early death Father    Diabetes Sister    Heart disease Sister        heart murmur   Arthritis Sister    COPD Sister    Hyperlipidemia Sister    Hypertension Sister    Colon polyps Sister        adenomous   Arthritis Sister    Heart disease Sister    Hyperlipidemia Sister    Hypertension Sister    Cancer Sister    Dementia Sister    Tremor Sister    Stroke Maternal Grandmother    Colon cancer Maternal Grandfather    Cancer Maternal Grandfather    Early death Maternal Grandfather    Early death Paternal Grandmother    Stroke Paternal Grandmother    Heart disease Daughter    Hyperlipidemia Daughter    Hypertension Daughter    Heart disease Son    Hyperlipidemia Son    Alcohol abuse Son    Depression Son    Diabetes Son    Pancreatic cancer Maternal Uncle    Alzheimer's disease Paternal Aunt    Alzheimer's disease Paternal Aunt    Alzheimer's disease Paternal Aunt    Alzheimer's disease Paternal Aunt    Cancer Other        2 paternal uncles and 1 maternal uncle colon cancer   Esophageal cancer Neg Hx    Stomach cancer Neg Hx     Social History   Socioeconomic History   Marital status: Divorced    Spouse name: Not on file   Number of  children: 3   Years of education: College   Highest education level: Some college, no degree  Occupational History   Occupation: Retired    Associate Professor: RETIRED  Tobacco Use   Smoking status: Never   Smokeless tobacco: Never  Vaping Use   Vaping Use: Never used  Substance and Sexual Activity   Alcohol use: Yes    Alcohol/week: 1.0 standard drink  of alcohol    Types: 1 Cans of beer per week    Comment: once a week beer or wine   Drug use: No   Sexual activity: Not Currently  Other Topics Concern   Not on file  Social History Narrative   Patient lives at home alone in a one story home.   Has 3 children (2 daughters and 1 son)    Retired from [employer].   Divorced since 1992    Caffeine Use: 1 cup daily and a soft drink occasionally    No guns    Wears seat belt     Lives with cat    Left handed   Social Determinants of Health   Financial Resource Strain: Low Risk  (11/10/2021)   Overall Financial Resource Strain (CARDIA)    Difficulty of Paying Living Expenses: Not hard at all  Food Insecurity: No Food Insecurity (11/10/2021)   Hunger Vital Sign    Worried About Running Out of Food in the Last Year: Never true    Ran Out of Food in the Last Year: Never true  Transportation Needs: No Transportation Needs (11/10/2021)   PRAPARE - Administrator, Civil Service (Medical): No    Lack of Transportation (Non-Medical): No  Physical Activity: Sufficiently Active (11/10/2021)   Exercise Vital Sign    Days of Exercise per Week: 6 days    Minutes of Exercise per Session: 60 min  Stress: No Stress Concern Present (11/10/2021)   [employer] of Occupational Health - Occupational Stress Questionnaire    Feeling of Stress : Only a little  Social Connections: Unknown (11/10/2021)   Social Connection and Isolation Panel [NHANES]    Frequency of Communication with Friends and Family: More than three times a week    Frequency of Social Gatherings with Friends and Family: Not on file    Attends Religious Services: More than 4 times per year    Active Member of [employer] or Organizations: Yes    Attends Banker Meetings: Not on file    Marital Status: Not on file  Intimate Partner Violence: Not At Risk (11/10/2021)   Humiliation, Afraid, Rape, and Kick questionnaire    Fear of Current or Ex-Partner:  No    Emotionally Abused: No    Physically Abused: No    Sexually Abused: No     Outpatient Medications Prior to Visit  Medication Sig Dispense Refill   albuterol (PROAIR HFA) 108 (90 Base) MCG/ACT inhaler Inhale 1-2 puffs into the lungs every 6 (six) hours as needed for wheezing or shortness of breath. 18 g 11   aspirin 81 MG tablet TAKE 1 TABLET BY MOUTH EVERY DAY     atorvastatin (LIPITOR) 20 MG tablet Take 1 tablet (20 mg total) by mouth daily. 90 tablet 3   Biotin 1000 MCG tablet Take 1,000 mcg by mouth daily.     BREO ELLIPTA 100-25 MCG/ACT AEPB Inhale 1 puff into the lungs daily. 28 each 11   Calcium 600-200 MG-UNIT tablet Take 1 tablet daily by mouth.  Cholecalciferol (VITAMIN D3) 2000 UNITS capsule Take 2,000 Units by mouth daily.     citalopram (CELEXA) 20 MG tablet Take 1 tablet (20 mg total) by mouth daily. 90 tablet 3   colestipol (COLESTID) 1 g tablet Take 1 tablet (1 g total) by mouth 2 (two) times daily. 180 tablet 3   FOLIC ACID PO Take by mouth daily.     levothyroxine (SYNTHROID) 75 MCG tablet Take 1 tablet (75 mcg total) by mouth daily before breakfast. 90 tablet 3   loperamide (IMODIUM A-D) 2 MG tablet Take 1 tablet (2 mg total) by mouth daily. 30 tablet 0   Magnesium 250 MG TABS Take by mouth.     olmesartan (BENICAR) 20 MG tablet Take 0.5 tablets (10 mg total) by mouth in the morning and at bedtime. D/c 5 mg (10 mg bid of 5 mg) 90 tablet 3   Omega-3 Fatty Acids (FISH OIL) 1000 MG CAPS Take 1,000 mg by mouth daily.      omeprazole (PRILOSEC) 20 MG capsule Take 1 capsule (20 mg total) by mouth 2 (two) times daily before a meal. 180 capsule 3   Potassium (POTASSIMIN PO) Take by mouth.     saccharomyces boulardii (FLORASTOR) 250 MG capsule Take 1 capsule (250 mg total) by mouth daily. 90 capsule 0   tiZANidine (ZANAFLEX) 2 MG tablet Take 1 tablet (2 mg total) by mouth at bedtime as needed for muscle spasms. 30 tablet 2   vitamin B-12 (CYANOCOBALAMIN) 1000 MCG tablet  Take 2,000 mcg by mouth daily. 2 tabs     No facility-administered medications prior to visit.    Allergies  Allergen Reactions   Epinephrine Other (See Comments) and Anaphylaxis    Heart palpitations   Penicillins Swelling and Other (See Comments)    Lip swelling Has patient had a PCN reaction causing immediate rash, facial/tongue/throat swelling, SOB or lightheadedness with hypotension: Yes Has patient had a PCN reaction causing severe rash involving mucus membranes or skin necrosis: No Has patient had a PCN reaction that required hospitalization No Has patient had a PCN reaction occurring within the last 10 years: No If all of the above answers are "NO", then may proceed with Cephalosporin use.   Other     Black pepper- caused bronchial tubes to start closing    Valium [Diazepam] Other (See Comments)    Other reaction(s): Other (See Comments) Overly sensitive  sedation    ROS Review of Systems  Constitutional: Negative.   Respiratory: Negative.    Cardiovascular: Negative.   Musculoskeletal:        Bruising to knee  Skin:        Laceration to elbow      Objective:    Physical Exam HENT:     Mouth/Throat:     Mouth: Mucous membranes are moist.  Eyes:     Pupils: Pupils are equal, round, and reactive to light.  Cardiovascular:     Rate and Rhythm: Normal rate and regular rhythm.     Pulses: Normal pulses.     Heart sounds: Normal heart sounds. No murmur heard. Pulmonary:     Effort: Pulmonary effort is normal.     Breath sounds: Normal breath sounds. No stridor. No wheezing.  Skin:    General: Skin is warm.     Findings: Bruising and lesion (no sigh of infection to L elbow laceration) present.  Neurological:     General: No focal deficit present.     Mental Status: She  is alert and oriented to person, place, and time. Mental status is at baseline.  Psychiatric:        Mood and Affect: Mood normal.        Behavior: Behavior normal.        Thought Content:  Thought content normal.        Judgment: Judgment normal.     BP 128/78   Pulse 90   Temp 97.9 F (36.6 C) (Oral)   Ht 5\' 6"  (1.676 m)   Wt 197 lb 3.2 oz (89.4 kg)   PF 94 L/min   BMI 31.83 kg/m  Wt Readings from Last 3 Encounters:  12/22/22 197 lb 3.2 oz (89.4 kg)  12/21/22 197 lb 4 oz (89.5 kg)  12/12/22 195 lb (88.5 kg)     Health Maintenance  Topic Date Due   Zoster Vaccines- Shingrix (2 of 2) 08/11/2011   COVID-19 Vaccine (6 - 2023-24 season) 03/18/2022   DTaP/Tdap/Td (2 - Tdap) 10/19/2022   Medicare Annual Wellness (AWV)  11/11/2022   INFLUENZA VACCINE  02/16/2023   Pneumonia Vaccine 72+ Years old  Completed   DEXA SCAN  Completed   HPV VACCINES  Aged Out   Hepatitis C Screening  Discontinued    There are no preventive care reminders to display for this patient.  Lab Results  Component Value Date   TSH 2.176 11/27/2022   Lab Results  Component Value Date   WBC 6.1 11/27/2022   HGB 12.6 11/27/2022   HCT 40.2 11/27/2022   MCV 93.7 11/27/2022   PLT 215 11/27/2022   Lab Results  Component Value Date   NA 138 11/27/2022   K 3.6 11/27/2022   CHLORIDE 112 (H) 04/07/2016   CO2 25 11/27/2022   GLUCOSE 97 11/27/2022   BUN 16 11/27/2022   CREATININE 1.01 (H) 11/27/2022   BILITOT 1.6 (H) 11/27/2022   ALKPHOS 79 11/27/2022   AST 18 11/27/2022   ALT 16 11/27/2022   PROT 6.7 11/27/2022   ALBUMIN 4.2 11/27/2022   CALCIUM 8.7 (L) 11/27/2022   ANIONGAP 10 11/27/2022   EGFR 60 (L) 04/07/2016   GFR 49.20 (L) 08/22/2022   Lab Results  Component Value Date   CHOL 168 08/22/2022   Lab Results  Component Value Date   HDL 76.40 08/22/2022   Lab Results  Component Value Date   LDLCALC 51 03/11/2021   Lab Results  Component Value Date   TRIG 226.0 (H) 08/22/2022   Lab Results  Component Value Date   CHOLHDL 2 08/22/2022   Lab Results  Component Value Date   HGBA1C 6.3 08/22/2022      Assessment & Plan:  Fall, subsequent encounter Assessment &  Plan: Stable at present.  No sign of infection to the laceration on the elbow. Advised patient to continue use Neosporin or Vaseline to the laceration. Encouraged to call Medicare advantage plan to check for medical alert [employer] coverage. Apply ice to the bruise on the knee.     Follow-up: No follow-ups on file.   Kara Dies, NP

## 2023-01-01 DIAGNOSIS — W19XXXA Unspecified fall, initial encounter: Secondary | ICD-10-CM | POA: Insufficient documentation

## 2023-01-01 NOTE — Assessment & Plan Note (Signed)
Stable at present.  No sign of infection to the laceration on the elbow. Advised patient to continue use Neosporin or Vaseline to the laceration. Encouraged to call Medicare advantage plan to check for medical alert [employer] coverage. Apply ice to the bruise on the knee.

## 2023-01-02 NOTE — Progress Notes (Deleted)
Cardiology Office Note:    Date:  01/02/2023   ID:  [name], DOB 07/08/1941, MRN [id number]  PCP:  Leah Allan, MD  [facility] Cardiologist:  None  [facility] Electrophysiologist:  None   Referring MD: Leah Allan, MD   Cc: Fatigue, stress  History of Present Illness:    [name] is a 82 y.o. Female with hx of  CAD and aortic athero on CT scan Chronic dizziness Mild carotid dz Hyperlipidemia obesity, deconditioning Syncope Scarring in lung apices on CT Chronic SOB Who presents for  coronary disease, edema, chronic stable angina, shortness of breath, pulmonary fibrosis  LOV February 2023 On that visit sedentary, chronic shortness of breath, lightheaded, blood pressure low at times Lymphedema, Amlodipine was held BP well controlled  Poor balance  Today, stress in family, illness No leg swelling  labs LDL 51 A1C 6.2  EKG personally reviewed by myself on todays visit NSR rate rate 72 no; ST or T wave change  Prior symptoms shortness of breath Stress test 02/2020 Normal echo 12/2019  CT scan in July 2020   pulm scarring fibrosis in the apices Followed by pulmonary,  airway reactivity  CT chest 01/2019 Aortic Atherosclerosis  Diffuse coronary artery calcifications, particularly dense within the left anterior descending coronary artery   Echocardiogram performed January 07, 2020 Normal ejection fraction, normal right heart pressures, normal RV function Mild to moderately TR  Echo in 2017: normal EF, normal RVSP  CT ABD: mild A athero There is 3 vessel coronary calcification  2017 and 2018    Past Medical History:  Diagnosis Date   Acid reflux    Allergy    Anemia    Anxiety    in past   Arthritis    all over   Asthma    Atypical moles    Bronchitis    Bronchospasm    reactive with perfume/cologne and smoke   CAD (coronary artery disease)    patient denies on preop of 10/24    Candida esophagitis (HCC)    Carotid artery  calcification, bilateral 04/2016   Carpal tunnel syndrome of left wrist 12/03/2019   Cataract    Cervical stenosis of spine    Chronic insomnia    Complication of anesthesia    shoulder surgery- not all the way asleep   COVID-19 02/04/2021   also 10/3 or 04/20/21 sxs started 04/05/21   Depression    in past   Diverticulitis    Diverticulosis    Dizziness    Elevated liver enzymes    Family history of adverse reaction to anesthesia    sister- has problems with nausea and vomiting    Fatigue    Fatty liver    Generalized headache    migraines   GERD (gastroesophageal reflux disease)    Gross hematuria 08/18/2022   Hereditary and idiopathic peripheral neuropathy 05/11/2015   History of chemotherapy    topical cream for h/o skin CA   History of kidney stones    History of neuropathy    FH Dr. Allena Katz neurology   History of vertigo    Hx of migraines    Hyperlipidemia    Hypothyroidism    Insomnia    Irritable bowel syndrome    LLQ abdominal pain    Melanoma (HCC) 2013   Melanoma (HCC)    x2 stg II melanoma   Neuropathy, peripheral    Peripheral neuropathy    Pneumonia    hx of x  2   PONV (postoperative nausea and vomiting)    Pre-diabetes    Retina disorder    left   SCC (squamous cell carcinoma)    skin    Skin cancer 07/17/2019   SCC left leg s/p mohs   SCC 05/01/18 left medial mid leg, right post mid leg 06/08/18, left mid well 04/10/19  Dr. Emily Filbert derm, Dr. Glennis Brink mohs       Stroke [facility])    ? TIA per Dr V per patient    Thyroid disease    Urine incontinence    Viral URI with cough 06/09/2022   Vitamin D deficiency    Weight gain     Past Surgical History:  Procedure Laterality Date   ABLATION Bilateral    Dr. Wyn Quaker 12/2020 veins laser ablation of both great saphenous veins in a staged fashion.   BILATERAL SALPINGOOPHORECTOMY     ovarian cyst Dr. Dareen Piano and Dr. Allie Dimmer 2008   BREAST SURGERY     in 2014 bx breast calcifications   BREAST SURGERY      augmentation with saline Dr. Karmen Bongo 1995    CHOLECYSTECTOMY N/A 01/28/2015   Procedure: LAPAROSCOPIC CHOLECYSTECTOMY WITH INTRAOPERATIVE CHOLANGIOGRAM;  Surgeon: Chevis Pretty III, MD;  Location: [facility] OR;  Service: General;  Laterality: N/A;   CLOSED MANIPULATION SHOULDER     rt   COLONOSCOPY WITH PROPOFOL N/A 11/20/2012   Procedure: COLONOSCOPY WITH PROPOFOL;  Surgeon: Charolett Bumpers, MD;  Location: [facility];  Service: Endoscopy;  Laterality: N/A;   colonscopy     May 2014   JOINT REPLACEMENT     RTK   KNEE ARTHROSCOPY     left knee miniscus tear repair     MELANOMA EXCISION WITH SENTINEL LYMPH NODE BIOPSY  03/18/2011   Back, nodes neg   OOPHORECTOMY     ROTATOR CUFF REPAIR     left   SHOULDER SURGERY     right and left    SKIN CANCER EXCISION     multiple   TOTAL HIP ARTHROPLASTY     TOTAL KNEE ARTHROPLASTY     right 2010 and left in 2018    TOTAL KNEE ARTHROPLASTY Left 05/16/2017   Procedure: LEFT TOTAL KNEE ARTHROPLASTY;  Surgeon: Durene Romans, MD;  Location: [facility];  Service: Orthopedics;  Laterality: Left;  70 mins   TUBAL LIGATION     TUMOR REMOVAL     ovary    Current Medications: Current Outpatient Medications on File Prior to Visit  Medication Sig Dispense Refill   albuterol (PROAIR HFA) 108 (90 Base) MCG/ACT inhaler Inhale 1-2 puffs into the lungs every 6 (six) hours as needed for wheezing or shortness of breath. 18 g 11   aspirin 81 MG tablet TAKE 1 TABLET BY MOUTH EVERY DAY     atorvastatin (LIPITOR) 20 MG tablet Take 1 tablet (20 mg total) by mouth daily. 90 tablet 3   Biotin 1000 MCG tablet Take 1,000 mcg by mouth daily.     BREO ELLIPTA 100-25 MCG/ACT AEPB Inhale 1 puff into the lungs daily. 28 each 11   Calcium 600-200 MG-UNIT tablet Take 1 tablet daily by mouth.     Cholecalciferol (VITAMIN D3) 2000 UNITS capsule Take 2,000 Units by mouth daily.     citalopram (CELEXA) 20 MG tablet Take 1 tablet (20 mg total) by mouth daily. 90 tablet 3    colestipol (COLESTID) 1 g tablet Take 1 tablet (1 g total) by mouth 2 (two) times  daily. 180 tablet 3   FOLIC ACID PO Take by mouth daily.     levothyroxine (SYNTHROID) 75 MCG tablet Take 1 tablet (75 mcg total) by mouth daily before breakfast. 90 tablet 3   loperamide (IMODIUM A-D) 2 MG tablet Take 1 tablet (2 mg total) by mouth daily. 30 tablet 0   Magnesium 250 MG TABS Take by mouth.     olmesartan (BENICAR) 20 MG tablet Take 0.5 tablets (10 mg total) by mouth in the morning and at bedtime. D/c 5 mg (10 mg bid of 5 mg) 90 tablet 3   Omega-3 Fatty Acids (FISH OIL) 1000 MG CAPS Take 1,000 mg by mouth daily.      omeprazole (PRILOSEC) 20 MG capsule Take 1 capsule (20 mg total) by mouth 2 (two) times daily before a meal. 180 capsule 3   Potassium (POTASSIMIN PO) Take by mouth.     saccharomyces boulardii (FLORASTOR) 250 MG capsule Take 1 capsule (250 mg total) by mouth daily. 90 capsule 0   tiZANidine (ZANAFLEX) 2 MG tablet Take 1 tablet (2 mg total) by mouth at bedtime as needed for muscle spasms. 30 tablet 2   vitamin B-12 (CYANOCOBALAMIN) 1000 MCG tablet Take 2,000 mcg by mouth daily. 2 tabs     No current facility-administered medications on file prior to visit.     Allergies:   Epinephrine, Penicillins, Other, and Valium [diazepam]   Social History   Socioeconomic History   Marital status: Divorced    Spouse name: Not on file   Number of children: 3   Years of education: College   Highest education level: Some college, no degree  Occupational History   Occupation: Retired    Associate Professor: RETIRED  Tobacco Use   Smoking status: Never   Smokeless tobacco: Never  Vaping Use   Vaping Use: Never used  Substance and Sexual Activity   Alcohol use: Yes    Alcohol/week: 1.0 standard drink of alcohol    Types: 1 Cans of beer per week    Comment: once a week beer or wine   Drug use: No   Sexual activity: Not Currently  Other Topics Concern   Not on file  Social History Narrative    Patient lives at home alone in a one story home.   Has 3 children (2 daughters and 1 son)    Retired from [employer].   Divorced since 1992    Caffeine Use: 1 cup daily and a soft drink occasionally    No guns    Wears seat belt     Lives with cat    Left handed   Social Determinants of Health   Financial Resource Strain: Low Risk  (11/10/2021)   Overall Financial Resource Strain (CARDIA)    Difficulty of Paying Living Expenses: Not hard at all  Food Insecurity: No Food Insecurity (11/10/2021)   Hunger Vital Sign    Worried About Running Out of Food in the Last Year: Never true    Ran Out of Food in the Last Year: Never true  Transportation Needs: No Transportation Needs (11/10/2021)   PRAPARE - Administrator, Civil Service (Medical): No    Lack of Transportation (Non-Medical): No  Physical Activity: Sufficiently Active (11/10/2021)   Exercise Vital Sign    Days of Exercise per Week: 6 days    Minutes of Exercise per Session: 60 min  Stress: No Stress Concern Present (11/10/2021)   [employer] of Occupational Health -  Occupational Stress Questionnaire    Feeling of Stress : Only a little  Social Connections: Unknown (11/10/2021)   Social Connection and Isolation Panel [NHANES]    Frequency of Communication with Friends and Family: More than three times a week    Frequency of Social Gatherings with Friends and Family: Not on file    Attends Religious Services: More than 4 times per year    Active Member of [employer] or Organizations: Yes    Attends Banker Meetings: Not on file    Marital Status: Not on file     Family History: The patient's family history includes Alcohol abuse in her son; Alzheimer's disease in her paternal aunt, paternal aunt, paternal aunt, and paternal aunt; Arthritis in her sister and sister; COPD in her sister; Cancer in her maternal grandfather, sister, and another family member; Colon cancer in her maternal  grandfather; Colon polyps in her sister; Dementia in her sister; Depression in her son; Diabetes in her father, sister, and son; Early death in her father, maternal grandfather, and paternal grandmother; Heart disease in her daughter, father, sister, sister, and son; Hyperlipidemia in her daughter, father, mother, sister, sister, and son; Hypertension in her daughter, father, mother, sister, and sister; Pancreatic cancer in her maternal uncle; Stroke in her maternal grandmother, mother, and paternal grandmother; Tremor in her sister. There is no history of Esophageal cancer or Stomach cancer.  ROS:   Please see the history of present illness.    Review of Systems  Constitutional: Negative.   HENT: Negative.    Respiratory:  Positive for shortness of breath.   Cardiovascular: Negative.   Gastrointestinal: Negative.   Musculoskeletal: Negative.   Neurological: Negative.   Psychiatric/Behavioral: Negative.    All other systems reviewed and are negative.   EKGs/Labs/Other Studies Reviewed:    The following studies were reviewed today:  Echo 12/2019 1. Left ventricular ejection fraction, by estimation, is 60 to 65%. The  left ventricle has normal function. The left ventricle has no regional  wall motion abnormalities. Left ventricular diastolic parameters are  consistent with age-related delayed  relaxation (normal).   2. Right ventricular systolic function is normal. The right ventricular  size is normal. There is normal pulmonary artery systolic pressure.   3. The mitral valve is degenerative. Mild mitral valve regurgitation. No  evidence of mitral stenosis.   4. Tricuspid valve regurgitation is mild to moderate.   5. The aortic valve is tricuspid. Aortic valve regurgitation is not  visualized. No aortic stenosis is present.   6. The inferior vena cava is normal in size with greater than 50%  respiratory variability, suggesting right atrial pressure of 3 mmHg.   EKG:  EKG is  ordered  today.  The ekg ordered today demonstrates normal sinus rhythm rate 59 bpm no significant ST-T wave changes  Recent Labs: 11/27/2022: ALT 16; BUN 16; Creatinine, Ser 1.01; Hemoglobin 12.6; Platelets 215; Potassium 3.6; Sodium 138; TSH 2.176  Recent Lipid Panel    Component Value Date/Time   CHOL 168 08/22/2022 1442   TRIG 226.0 (H) 08/22/2022 1442   HDL 76.40 08/22/2022 1442   CHOLHDL 2 08/22/2022 1442   VLDL 45.2 (H) 08/22/2022 1442   LDLCALC 51 03/11/2021 1054   LDLDIRECT 67.0 08/22/2022 1442       Physical Exam:    VS:  There were no vitals taken for this visit.    Wt Readings from Last 3 Encounters:  12/22/22 197 lb 3.2 oz (89.4 kg)  12/21/22 197 lb 4 oz (89.5 kg)  12/12/22 195 lb (88.5 kg)     Constitutional:  oriented to person, place, and time. No distress.  HENT:  Head: Grossly normal Eyes:  no discharge. No scleral icterus.  Neck: No JVD, no carotid bruits  Cardiovascular: Regular rate and rhythm, no murmurs appreciated Pulmonary/Chest: Clear to auscultation bilaterally,  Scattered Rales Abdominal: Soft.  no distension.  no tenderness.  Musculoskeletal: Normal range of motion Neurological:  normal muscle tone. Coordination normal. No atrophy Skin: Skin warm and dry Psychiatric: normal affect, pleasant  ASSESSMENT:    No diagnosis found.  PLAN:    Chronic shortness of breath Deconditioning, obesity, stress stress test with no significant ischemia Echocardiogram normal ejection fraction EKG normal, Recommended walking program for conditioning, reassurance provided  CAD with chronic stable angina No further ischemic work-up at this time, cholesterol at goal Lifestyle modification recommended  Obesity We have encouraged continued exercise, careful diet management in an effort to lose weight.  Scarring of lung/shortness of breath Had PNA in the past Followed by pulmonary, Stressed importance of weight loss, conditioning No additional cardiac  testing needed  Leg swelling Amlodipine previously held Leg swelling resolved with vascular procedure, lymphedema compression pumps  Essential hypertension olmesartan from 5 BID BP stable No ca channel blocker   Total encounter time more than 30 minutes  Greater than 50% was spent in counseling and coordination of care with the patient    Signed, Julien Nordmann, MD  01/02/2023 9:06 PM    [facility]

## 2023-01-03 ENCOUNTER — Ambulatory Visit: Payer: PPO | Admitting: Cardiovascular Disease

## 2023-01-03 DIAGNOSIS — I739 Peripheral vascular disease, unspecified: Secondary | ICD-10-CM

## 2023-01-03 DIAGNOSIS — I700 Atherosclerosis of aorta: Secondary | ICD-10-CM

## 2023-01-03 DIAGNOSIS — I6523 Occlusion and stenosis of bilateral carotid arteries: Secondary | ICD-10-CM

## 2023-01-03 DIAGNOSIS — I10 Essential (primary) hypertension: Secondary | ICD-10-CM

## 2023-01-03 DIAGNOSIS — I25118 Atherosclerotic heart disease of native coronary artery with other forms of angina pectoris: Secondary | ICD-10-CM

## 2023-01-09 NOTE — Progress Notes (Unsigned)
Cardiology Office Note:    Date:  01/11/2023   ID:  [name], DOB [birth year], MRN [id number]   Referring MD: Dana Allan, MD   Chief Complaint  Patient presents with   12 month follow up     Patient c/o dizzinesss & shortness of breath & sleeping all the time. Medications reviewed by the patient verbally.     History of Present Illness:    [name] is a 82 y.o. Female with hx of  CAD and aortic athero on CT scan Chronic dizziness Mild carotid dz Hyperlipidemia obesity, deconditioning Syncope Scarring in lung apices on CT Chronic SOB, COVID-02 February 2021 /mild reactive airways dysfunction syndrome  Who presents for  coronary disease, edema, chronic stable angina, shortness of breath, pulmonary fibrosis  LOV February 2023 On that visit sedentary, chronic shortness of breath, lightheaded, blood pressure low at times  In follow-up today reports that she continues to be sedentary, gait instability, leg weakness, no falls, uses a cane, balance is poor  Uses lymphedema compression pumps 1 hour twice a day for lymphedema,  Nocturia, has to get up 3x a night  Tired during the day, sleeping more in the day Leg swelling minimal, stable  Somewhat labile blood pressure in the office today 117/70 on my recheck, sitting  Takes benicar 10 BID  Larey Seat out of bed, cut her arm  Did not complete PFTs Followed by pulmonary  labs reviewed LDL 51, total chol 168 A1C 6.3  EKG personally reviewed by myself on todays visit EKG Interpretation  Date/Time:  Wednesday January 11 2023 12:16:51 EDT Ventricular Rate:  62 PR Interval:  160 QRS Duration: 72 QT Interval:  386 QTC Calculation: 391 R Axis:   12 Text Interpretation: Normal sinus rhythm normal ekg When compared with ECG of 12-Dec-2022 06:13, PREVIOUS ECG IS PRESENT Confirmed by Julien Nordmann [phone]) on 01/11/2023 12:39:34 PM    Prior symptoms shortness of breath Stress test 02/2020 Normal echo 12/2019  CT scan in  July 2020 pulm scarring fibrosis in the apices Followed by pulmonary,  airway reactivity  CT chest 01/2019 Aortic Atherosclerosis  Diffuse coronary artery calcifications, particularly dense within the left anterior descending coronary artery   Echocardiogram performed January 07, 2020 Normal ejection fraction, normal right heart pressures, normal RV function Mild to moderately TR  Echo in 2017: normal EF, normal RVSP  CT ABD: mild A athero There is 3 vessel coronary calcification  2017 and 2018    Past Medical History:  Diagnosis Date   Acid reflux    Allergy    Anemia    Anxiety    in past   Arthritis    all over   Asthma    Atypical moles    Bronchitis    Bronchospasm    reactive with perfume/cologne and smoke   CAD (coronary artery disease)    patient denies on preop of 10/24    Candida esophagitis (HCC)    Carotid artery calcification, bilateral 04/2016   Carpal tunnel syndrome of left wrist 12/03/2019   Cataract    Cervical stenosis of spine    Chronic insomnia    Complication of anesthesia    shoulder surgery- not all the way asleep   COVID-19 02/04/2021   also 10/3 or 04/20/21 sxs started 04/05/21   Depression    in past   Diverticulitis    Diverticulosis    Dizziness    Elevated liver enzymes    Family history of adverse reaction  to anesthesia    sister- has problems with nausea and vomiting    Fatigue    Fatty liver    Generalized headache    migraines   GERD (gastroesophageal reflux disease)    Gross hematuria 08/18/2022   Hereditary and idiopathic peripheral neuropathy 05/11/2015   History of chemotherapy    topical cream for h/o skin CA   History of kidney stones    History of neuropathy    FH Dr. Allena Katz neurology   History of vertigo    Hx of migraines    Hyperlipidemia    Hypothyroidism    Insomnia    Irritable bowel syndrome    LLQ abdominal pain    Melanoma (HCC) 2013   Melanoma (HCC)    x2 stg II melanoma   Neuropathy, peripheral     Peripheral neuropathy    Pneumonia    hx of x 2   PONV (postoperative nausea and vomiting)    Pre-diabetes    Retina disorder    left   SCC (squamous cell carcinoma)    skin    Skin cancer 07/17/2019   SCC left leg s/p mohs   SCC 05/01/18 left medial mid leg, right post mid leg 06/08/18, left mid well 04/10/19  Dr. Emily Filbert derm, Dr. Glennis Brink mohs       Stroke [facility])    ? TIA per Dr V per patient    Thyroid disease    Urine incontinence    Viral URI with cough 06/09/2022   Vitamin D deficiency    Weight gain     Past Surgical History:  Procedure Laterality Date   ABLATION Bilateral    Dr. Wyn Quaker 12/2020 veins laser ablation of both great saphenous veins in a staged fashion.   BILATERAL SALPINGOOPHORECTOMY     ovarian cyst Dr. Dareen Piano and Dr. Allie Dimmer 2008   BREAST SURGERY     in 2014 bx breast calcifications   BREAST SURGERY     augmentation with saline Dr. Karmen Bongo 1995    CHOLECYSTECTOMY N/A 01/28/2015   Procedure: LAPAROSCOPIC CHOLECYSTECTOMY WITH INTRAOPERATIVE CHOLANGIOGRAM;  Surgeon: Chevis Pretty III, MD;  Location: [facility] OR;  Service: General;  Laterality: N/A;   CLOSED MANIPULATION SHOULDER     rt   COLONOSCOPY WITH PROPOFOL N/A 11/20/2012   Procedure: COLONOSCOPY WITH PROPOFOL;  Surgeon: Charolett Bumpers, MD;  Location: [facility] ENDOSCOPY;  Service: Endoscopy;  Laterality: N/A;   colonscopy     May 2014   JOINT REPLACEMENT     RTK   KNEE ARTHROSCOPY     left knee miniscus tear repair     MELANOMA EXCISION WITH SENTINEL LYMPH NODE BIOPSY  03/18/2011   Back, nodes neg   OOPHORECTOMY     ROTATOR CUFF REPAIR     left   SHOULDER SURGERY     right and left    SKIN CANCER EXCISION     multiple   TOTAL HIP ARTHROPLASTY     TOTAL KNEE ARTHROPLASTY     right 2010 and left in 2018    TOTAL KNEE ARTHROPLASTY Left 05/16/2017   Procedure: LEFT TOTAL KNEE ARTHROPLASTY;  Surgeon: Durene Romans, MD;  Location: [facility] ORS;  Service: Orthopedics;  Laterality: Left;  70 mins   TUBAL  LIGATION     TUMOR REMOVAL     ovary    Current Medications: Current Outpatient Medications on File Prior to Visit  Medication Sig Dispense Refill   albuterol (PROAIR HFA) 108 (90 Base) MCG/ACT  inhaler Inhale 1-2 puffs into the lungs every 6 (six) hours as needed for wheezing or shortness of breath. 18 g 11   aspirin 81 MG tablet TAKE 1 TABLET BY MOUTH EVERY DAY     atorvastatin (LIPITOR) 20 MG tablet Take 1 tablet (20 mg total) by mouth daily. 90 tablet 3   Biotin 1000 MCG tablet Take 1,000 mcg by mouth daily.     BREO ELLIPTA 100-25 MCG/ACT AEPB Inhale 1 puff into the lungs daily. 28 each 11   Calcium 600-200 MG-UNIT tablet Take 1 tablet daily by mouth.     Cholecalciferol (VITAMIN D3) 2000 UNITS capsule Take 2,000 Units by mouth daily.     citalopram (CELEXA) 20 MG tablet Take 1 tablet (20 mg total) by mouth daily. 90 tablet 3   colestipol (COLESTID) 1 g tablet Take 1 tablet (1 g total) by mouth 2 (two) times daily. 180 tablet 3   FOLIC ACID PO Take by mouth daily.     levothyroxine (SYNTHROID) 75 MCG tablet Take 1 tablet (75 mcg total) by mouth daily before breakfast. 90 tablet 3   loperamide (IMODIUM A-D) 2 MG tablet Take 1 tablet (2 mg total) by mouth daily. 30 tablet 0   Magnesium 250 MG TABS Take by mouth.     olmesartan (BENICAR) 20 MG tablet Take 0.5 tablets (10 mg total) by mouth in the morning and at bedtime. D/c 5 mg (10 mg bid of 5 mg) 90 tablet 3   Omega-3 Fatty Acids (FISH OIL) 1000 MG CAPS Take 1,000 mg by mouth daily.      omeprazole (PRILOSEC) 20 MG capsule Take 1 capsule (20 mg total) by mouth 2 (two) times daily before a meal. 180 capsule 3   Potassium (POTASSIMIN PO) Take by mouth.     vitamin B-12 (CYANOCOBALAMIN) 1000 MCG tablet Take 2,000 mcg by mouth daily. 2 tabs     saccharomyces boulardii (FLORASTOR) 250 MG capsule Take 1 capsule (250 mg total) by mouth daily. (Patient not taking: Reported on 01/11/2023) 90 capsule 0   tiZANidine (ZANAFLEX) 2 MG tablet Take 1  tablet (2 mg total) by mouth at bedtime as needed for muscle spasms. (Patient not taking: Reported on 01/11/2023) 30 tablet 2   No current facility-administered medications on file prior to visit.     Allergies:   Epinephrine, Penicillins, Other, and Valium [diazepam]   Social History   Socioeconomic History   Marital status: Divorced    Spouse name: Not on file   Number of children: 3   Years of education: College   Highest education level: Some college, no degree  Occupational History   Occupation: Retired    Associate Professor: RETIRED  Tobacco Use   Smoking status: Never   Smokeless tobacco: Never  Vaping Use   Vaping Use: Never used  Substance and Sexual Activity   Alcohol use: Yes    Alcohol/week: 1.0 standard drink of alcohol    Types: 1 Cans of beer per week    Comment: once a week beer or wine   Drug use: No   Sexual activity: Not Currently  Other Topics Concern   Not on file  Social History Narrative   Patient lives at home alone in a one story home.   Has 3 children (2 daughters and 1 son)    Retired from [employer].   Divorced since 1992    Caffeine Use: 1 cup daily and a soft drink occasionally    No guns  Wears seat belt     Lives with cat    Left handed   Social Determinants of Health   Financial Resource Strain: Low Risk  (11/10/2021)   Overall Financial Resource Strain (CARDIA)    Difficulty of Paying Living Expenses: Not hard at all  Food Insecurity: No Food Insecurity (11/10/2021)   Hunger Vital Sign    Worried About Running Out of Food in the Last Year: Never true    Ran Out of Food in the Last Year: Never true  Transportation Needs: No Transportation Needs (11/10/2021)   PRAPARE - Administrator, Civil Service (Medical): No    Lack of Transportation (Non-Medical): No  Physical Activity: Sufficiently Active (11/10/2021)   Exercise Vital Sign    Days of Exercise per Week: 6 days    Minutes of Exercise per Session: 60 min   Stress: No Stress Concern Present (11/10/2021)   [employer] of Occupational Health - Occupational Stress Questionnaire    Feeling of Stress : Only a little  Social Connections: Unknown (11/10/2021)   Social Connection and Isolation Panel [NHANES]    Frequency of Communication with Friends and Family: More than three times a week    Frequency of Social Gatherings with Friends and Family: Not on file    Attends Religious Services: More than 4 times per year    Active Member of [employer] or Organizations: Yes    Attends Banker Meetings: Not on file    Marital Status: Not on file     Family History: The patient's family history includes Alcohol abuse in her son; Alzheimer's disease in her paternal aunt, paternal aunt, paternal aunt, and paternal aunt; Arthritis in her sister and sister; COPD in her sister; Cancer in her maternal grandfather, sister, and another family member; Colon cancer in her maternal grandfather; Colon polyps in her sister; Dementia in her sister; Depression in her son; Diabetes in her father, sister, and son; Early death in her father, maternal grandfather, and paternal grandmother; Heart disease in her daughter, father, sister, sister, and son; Hyperlipidemia in her daughter, father, mother, sister, sister, and son; Hypertension in her daughter, father, mother, sister, and sister; Pancreatic cancer in her maternal uncle; Stroke in her maternal grandmother, mother, and paternal grandmother; Tremor in her sister. There is no history of Esophageal cancer or Stomach cancer.  ROS:   Please see the history of present illness.    Review of Systems  Constitutional: Negative.   HENT: Negative.    Respiratory: Negative.    Cardiovascular: Negative.   Gastrointestinal: Negative.   Musculoskeletal: Negative.   Neurological: Negative.   Psychiatric/Behavioral: Negative.    All other systems reviewed and are negative.   EKGs/Labs/Other Studies Reviewed:    The  following studies were reviewed today:  Echo 12/2019 1. Left ventricular ejection fraction, by estimation, is 60 to 65%. The  left ventricle has normal function. The left ventricle has no regional  wall motion abnormalities. Left ventricular diastolic parameters are  consistent with age-related delayed  relaxation (normal).   2. Right ventricular systolic function is normal. The right ventricular  size is normal. There is normal pulmonary artery systolic pressure.   3. The mitral valve is degenerative. Mild mitral valve regurgitation. No  evidence of mitral stenosis.   4. Tricuspid valve regurgitation is mild to moderate.   5. The aortic valve is tricuspid. Aortic valve regurgitation is not  visualized. No aortic stenosis is present.   6. The inferior vena  cava is normal in size with greater than 50%  respiratory variability, suggesting right atrial pressure of 3 mmHg.    Recent Labs: 11/27/2022: ALT 16; BUN 16; Creatinine, Ser 1.01; Hemoglobin 12.6; Platelets 215; Potassium 3.6; Sodium 138; TSH 2.176  Recent Lipid Panel    Component Value Date/Time   CHOL 168 08/22/2022 1442   TRIG 226.0 (H) 08/22/2022 1442   HDL 76.40 08/22/2022 1442   CHOLHDL 2 08/22/2022 1442   VLDL 45.2 (H) 08/22/2022 1442   LDLCALC 51 03/11/2021 1054   LDLDIRECT 67.0 08/22/2022 1442     Physical Exam:    VS:  BP (!) 100/56 (BP Location: Left Arm, Patient Position: Sitting, Cuff Size: Normal)   Pulse 62   Ht 5\' 6"  (1.676 m)   Wt 194 lb 2 oz (88.1 kg)   SpO2 97%   BMI 31.33 kg/m     Wt Readings from Last 3 Encounters:  01/11/23 194 lb 2 oz (88.1 kg)  12/22/22 197 lb 3.2 oz (89.4 kg)  12/21/22 197 lb 4 oz (89.5 kg)    Constitutional:  oriented to person, place, and time. No distress.  HENT:  Head: Grossly normal Eyes:  no discharge. No scleral icterus.  Neck: No JVD, no carotid bruits  Cardiovascular: Regular rate and rhythm, no murmurs appreciated Pulmonary/Chest: Clear to auscultation  bilaterally,  Scattered Rales Abdominal: Soft.  no distension.  no tenderness.  Musculoskeletal: Normal range of motion Neurological:  normal muscle tone. Coordination normal. No atrophy Skin: Skin warm and dry Psychiatric: normal affect, pleasant  ASSESSMENT:    1. Coronary artery disease of native artery of native heart with stable angina pectoris (HCC)   2. PAD (peripheral artery disease) (HCC)   3. Essential hypertension   4. Aortic atherosclerosis (HCC)   5. Mixed hyperlipidemia   6. Cerebrovascular accident (CVA), unspecified mechanism (HCC)   7. Bilateral carotid artery stenosis    PLAN:    Chronic shortness of breath Deconditioning, leg weakness Prior cardiac workup unrevealing stress test with no significant ischemia Echocardiogram normal ejection fraction EKG normal, We have recommended she start a walking program, use her walker Grossly appears euvolemic  CAD with chronic stable angina No further ischemic work-up at this time, cholesterol at goal Cholesterol close to goal  Obesity Walking program recommended  Scarring of lung/shortness of breath Followed by pulmonary, on inhalers, feels her breathing is stable, did not complete PFTs No additional cardiac testing needed  Leg swelling Amlodipine previously held Minimal leg swelling on today's visit, using compression pumps 1 hour twice a day  Essential hypertension olmesartan twice daily BP stable No ca channel blocker given propensity for leg swelling   Total encounter time more than 40 minutes  Greater than 50% was spent in counseling and coordination of care with the patient    Signed, Julien Nordmann, MD  01/11/2023 12:27 PM    [facility]

## 2023-01-11 ENCOUNTER — Ambulatory Visit: Payer: PPO | Attending: Cardiovascular Disease | Admitting: Cardiovascular Disease

## 2023-01-11 ENCOUNTER — Encounter: Payer: Self-pay | Admitting: Cardiovascular Disease

## 2023-01-11 VITALS — BP 100/56 | HR 62 | Ht 66.0 in | Wt 194.1 lb

## 2023-01-11 DIAGNOSIS — I639 Cerebral infarction, unspecified: Secondary | ICD-10-CM | POA: Diagnosis not present

## 2023-01-11 DIAGNOSIS — I700 Atherosclerosis of aorta: Secondary | ICD-10-CM

## 2023-01-11 DIAGNOSIS — I10 Essential (primary) hypertension: Secondary | ICD-10-CM

## 2023-01-11 DIAGNOSIS — I6523 Occlusion and stenosis of bilateral carotid arteries: Secondary | ICD-10-CM

## 2023-01-11 DIAGNOSIS — E782 Mixed hyperlipidemia: Secondary | ICD-10-CM | POA: Diagnosis not present

## 2023-01-11 DIAGNOSIS — I739 Peripheral vascular disease, unspecified: Secondary | ICD-10-CM | POA: Diagnosis not present

## 2023-01-11 DIAGNOSIS — I25118 Atherosclerotic heart disease of native coronary artery with other forms of angina pectoris: Secondary | ICD-10-CM

## 2023-01-11 NOTE — Patient Instructions (Signed)
Medication Instructions:  No changes  If you need a refill on your cardiac medications before your next appointment, please call your pharmacy.   Lab work: No new labs needed  Testing/Procedures: No new testing needed  Follow-Up: At [facility], you and your health needs are our priority.  As part of our continuing mission to provide you with exceptional heart care, we have created designated Provider Care Teams.  These Care Teams include your primary Cardiologist (physician) and Advanced Practice Providers (APPs -  Physician Assistants and Nurse Practitioners) who all work together to provide you with the care you need, when you need it.  You will need a follow up appointment in 12 months  Providers on your designated Care Team:   Christopher Berge, NP Ryan Dunn, PA-C Cadence Furth, PA-C  COVID-19 Vaccine Information can be found at: [email or web address] For questions related to vaccine distribution or appointments, please email [email or web address] or call [phone].   

## 2023-01-17 ENCOUNTER — Ambulatory Visit: Payer: PPO | Admitting: Gastroenterology

## 2023-01-19 DIAGNOSIS — Z4802 Encounter for removal of sutures: Secondary | ICD-10-CM | POA: Diagnosis not present

## 2023-01-19 DIAGNOSIS — W57XXXA Bitten or stung by nonvenomous insect and other nonvenomous arthropods, initial encounter: Secondary | ICD-10-CM | POA: Diagnosis not present

## 2023-01-19 DIAGNOSIS — S20362A Insect bite (nonvenomous) of left front wall of thorax, initial encounter: Secondary | ICD-10-CM | POA: Diagnosis not present

## 2023-01-31 DIAGNOSIS — Z7689 Persons encountering health services in other specified circumstances: Secondary | ICD-10-CM | POA: Diagnosis not present

## 2023-01-31 DIAGNOSIS — R3589 Other polyuria: Secondary | ICD-10-CM | POA: Diagnosis not present

## 2023-02-02 DIAGNOSIS — R3589 Other polyuria: Secondary | ICD-10-CM | POA: Diagnosis not present

## 2023-02-09 ENCOUNTER — Telehealth: Payer: Self-pay | Admitting: Family Medicine

## 2023-02-09 MED ORDER — OMEPRAZOLE 20 MG PO CPDR: 20.0000 mg | DELAYED_RELEASE_CAPSULE | Freq: Two times a day (BID) | ORAL | 3 refills | Status: DC

## 2023-02-09 NOTE — Telephone Encounter (Signed)
Chart supports rx. Last OV: 12/22/2022

## 2023-02-09 NOTE — Telephone Encounter (Signed)
Prescription Request  02/09/2023  LOV: 11/30/2022  What is the name of the medication or equipment? omeprazole (PRILOSEC) 20 MG capsule  Have you contacted your pharmacy to request a refill? Yes   Which pharmacy would you like this sent to?   [facility] [address], [place] - [address] [place] [address] Phone: [phone] Fax: [phone]    Patient notified that their request is being sent to the clinical staff for review and that they should receive a response within 2 business days.   Please advise at Mobile [phone] (mobile)

## 2023-02-16 DIAGNOSIS — Z1231 Encounter for screening mammogram for malignant neoplasm of breast: Secondary | ICD-10-CM | POA: Diagnosis not present

## 2023-02-16 LAB — HM MAMMOGRAPHY

## 2023-02-20 ENCOUNTER — Other Ambulatory Visit: Payer: Self-pay

## 2023-02-20 DIAGNOSIS — E039 Hypothyroidism, unspecified: Secondary | ICD-10-CM

## 2023-02-20 DIAGNOSIS — M25571 Pain in right ankle and joints of right foot: Secondary | ICD-10-CM | POA: Diagnosis not present

## 2023-02-20 MED ORDER — LEVOTHYROXINE SODIUM 75 MCG PO TABS
75.0000 ug | ORAL_TABLET | Freq: Every day | ORAL | 3 refills | Status: DC
Start: 2023-02-20 — End: 2024-02-21

## 2023-02-20 MED ORDER — OLMESARTAN MEDOXOMIL 20 MG PO TABS: 10.0000 mg | ORAL_TABLET | Freq: Two times a day (BID) | ORAL | 3 refills | Status: DC

## 2023-02-20 NOTE — Telephone Encounter (Signed)
levothyroxine (SYNTHROID) 75 MCG tablet filled 01/12/2022 #90 3 refills LOV 11/30/2022 NOV no follow up scheduled.  olmesartan (BENICAR) 20 MG tablet filled 01/12/2022 #90 3 refills

## 2023-02-23 ENCOUNTER — Ambulatory Visit: Payer: PPO | Admitting: Podiatry

## 2023-02-23 ENCOUNTER — Telehealth: Payer: Self-pay | Admitting: Podiatry

## 2023-02-23 NOTE — Telephone Encounter (Signed)
Pt left message yesterday at 654pm stating due to the weather she may want to cxl the appt for today.   I called pt and left message for pt to call back to let us know if she was wanting to r/s

## 2023-02-27 ENCOUNTER — Other Ambulatory Visit: Payer: Self-pay

## 2023-02-27 DIAGNOSIS — E785 Hyperlipidemia, unspecified: Secondary | ICD-10-CM

## 2023-02-27 MED ORDER — ATORVASTATIN CALCIUM 20 MG PO TABS: 20.0000 mg | ORAL_TABLET | Freq: Every day | ORAL | 3 refills | Status: DC

## 2023-02-27 NOTE — Telephone Encounter (Signed)
atorvastatin (LIPITOR) 20 MG tablet LOV 11/30/2022 NOV NO follow up on file

## 2023-03-03 ENCOUNTER — Encounter: Payer: Self-pay | Admitting: Podiatry

## 2023-03-03 ENCOUNTER — Telehealth: Payer: Self-pay | Admitting: Family Medicine

## 2023-03-03 ENCOUNTER — Ambulatory Visit (INDEPENDENT_AMBULATORY_CARE_PROVIDER_SITE_OTHER): Payer: PPO

## 2023-03-03 ENCOUNTER — Ambulatory Visit (INDEPENDENT_AMBULATORY_CARE_PROVIDER_SITE_OTHER): Payer: PPO | Admitting: Podiatry

## 2023-03-03 DIAGNOSIS — M79675 Pain in left toe(s): Secondary | ICD-10-CM | POA: Diagnosis not present

## 2023-03-03 DIAGNOSIS — M79674 Pain in right toe(s): Secondary | ICD-10-CM

## 2023-03-03 DIAGNOSIS — M778 Other enthesopathies, not elsewhere classified: Secondary | ICD-10-CM

## 2023-03-03 DIAGNOSIS — B351 Tinea unguium: Secondary | ICD-10-CM

## 2023-03-03 DIAGNOSIS — G629 Polyneuropathy, unspecified: Secondary | ICD-10-CM

## 2023-03-03 DIAGNOSIS — M7751 Other enthesopathy of right foot: Secondary | ICD-10-CM

## 2023-03-03 DIAGNOSIS — L84 Corns and callosities: Secondary | ICD-10-CM | POA: Diagnosis not present

## 2023-03-03 MED ORDER — TRIAMCINOLONE ACETONIDE 10 MG/ML IJ SUSP
10.0000 mg | Freq: Once | INTRAMUSCULAR | Status: AC
Start: 2023-03-03 — End: 2023-03-03
  Administered 2023-03-03: 10 mg via INTRA_ARTICULAR

## 2023-03-03 NOTE — Telephone Encounter (Signed)
Copied from CRM [id number]. Topic: Medicare AWV >> Mar 03, 2023 10:29 AM Payton Doughty wrote: Reason for CRM: LM 03/03/2023 to schedule AWV   Verlee Rossetti; Care Guide Ambulatory Clinical Support [facility] Direct Dial: [phone]

## 2023-03-06 NOTE — Progress Notes (Signed)
Subjective:   Patient ID: [name], female   DOB: 82 y.o.   MRN: [id number]   HPI Patient presents with numerous problems with 1 being lesion formation both feet with keratotic deep tissue formation secondary inflammation around the second MPJ left fluid buildup around the joint surface and thickened elongated painful nailbeds 1-5 both feet that she cannot cut.  Patient had trouble with all these issues and does not smoke likes to be active   Review of Systems  All other systems reviewed and are negative.       Objective:  Physical Exam Vitals and nursing note reviewed.  Constitutional:      Appearance: She is well-developed.  Pulmonary:     Effort: Pulmonary effort is normal.  Musculoskeletal:        General: Normal range of motion.  Skin:    General: Skin is warm.  Neurological:     Mental Status: She is alert.     Neurovascular status was found to be intact muscle strength is found to be adequate with the patient found to have mild cognitive impairment is noted to have thick brittle nailbeds 1-5 both feet that are elongated to get sore is noted to have lesion formation bilateral first and fifth metatarsals that are deep and painful with diminishment of sharp dull vibratory and history of neuropathic like complaints.  The second MPJ very inflamed     Assessment:  Numerous different problems with inflamed capsule lesion formation and nail disease     Plan:  H&P reviewed all conditions.  I debrided nailbeds 1-5 both feet I debrided lesions bilateral no iatrogenic bleeding went over the risk associated with neuropathy and I did sterile prep and injected around the second MPJ 3 mg Dexasone Kenalog 5 mg Xylocaine to reduce inflammation.  Reappoint to recheck as symptoms indicate  X-rays indicate there does appear to be moderate arthritis no indication stress fracture or advanced condition

## 2023-03-09 DIAGNOSIS — L565 Disseminated superficial actinic porokeratosis (DSAP): Secondary | ICD-10-CM | POA: Diagnosis not present

## 2023-03-09 DIAGNOSIS — D692 Other nonthrombocytopenic purpura: Secondary | ICD-10-CM | POA: Diagnosis not present

## 2023-03-09 DIAGNOSIS — L821 Other seborrheic keratosis: Secondary | ICD-10-CM | POA: Diagnosis not present

## 2023-03-09 DIAGNOSIS — L570 Actinic keratosis: Secondary | ICD-10-CM | POA: Diagnosis not present

## 2023-03-09 DIAGNOSIS — D485 Neoplasm of uncertain behavior of skin: Secondary | ICD-10-CM | POA: Diagnosis not present

## 2023-03-16 DIAGNOSIS — L570 Actinic keratosis: Secondary | ICD-10-CM | POA: Diagnosis not present

## 2023-03-16 DIAGNOSIS — R58 Hemorrhage, not elsewhere classified: Secondary | ICD-10-CM | POA: Diagnosis not present

## 2023-03-21 ENCOUNTER — Ambulatory Visit (INDEPENDENT_AMBULATORY_CARE_PROVIDER_SITE_OTHER): Payer: PPO | Admitting: Vascular Surgery

## 2023-03-21 ENCOUNTER — Emergency Department: Payer: PPO

## 2023-03-21 ENCOUNTER — Other Ambulatory Visit: Payer: Self-pay

## 2023-03-21 ENCOUNTER — Emergency Department
Admission: EM | Admit: 2023-03-21 | Discharge: 2023-03-21 | Disposition: A | Discharge status: Home / Self Care | Payer: PPO | Attending: Emergency Medicine | Admitting: Emergency Medicine

## 2023-03-21 DIAGNOSIS — K2100 Gastro-esophageal reflux disease with esophagitis, without bleeding: Secondary | ICD-10-CM | POA: Diagnosis not present

## 2023-03-21 DIAGNOSIS — R109 Unspecified abdominal pain: Secondary | ICD-10-CM | POA: Diagnosis present

## 2023-03-21 DIAGNOSIS — J45909 Unspecified asthma, uncomplicated: Secondary | ICD-10-CM | POA: Diagnosis not present

## 2023-03-21 DIAGNOSIS — I10 Essential (primary) hypertension: Secondary | ICD-10-CM | POA: Diagnosis not present

## 2023-03-21 DIAGNOSIS — R0789 Other chest pain: Secondary | ICD-10-CM | POA: Diagnosis not present

## 2023-03-21 DIAGNOSIS — Z1152 Encounter for screening for COVID-19: Secondary | ICD-10-CM | POA: Diagnosis not present

## 2023-03-21 DIAGNOSIS — K219 Gastro-esophageal reflux disease without esophagitis: Secondary | ICD-10-CM | POA: Diagnosis not present

## 2023-03-21 DIAGNOSIS — R079 Chest pain, unspecified: Secondary | ICD-10-CM | POA: Diagnosis not present

## 2023-03-21 LAB — CBC
HCT: 40.7 % (ref 36.0–46.0)
Hemoglobin: 13.1 g/dL (ref 12.0–15.0)
MCH: 29.8 pg (ref 26.0–34.0)
MCHC: 32.2 g/dL (ref 30.0–36.0)
MCV: 92.5 fL (ref 80.0–100.0)
Platelets: 195 10*3/uL (ref 150–400)
RBC: 4.4 MIL/uL (ref 3.87–5.11)
RDW: 13.2 % (ref 11.5–15.5)
WBC: 6.2 10*3/uL (ref 4.0–10.5)
nRBC: 0 % (ref 0.0–0.2)

## 2023-03-21 LAB — URINALYSIS, ROUTINE W REFLEX MICROSCOPIC
Bacteria, UA: NONE SEEN
Bilirubin Urine: NEGATIVE
Glucose, UA: NEGATIVE mg/dL
Hgb urine dipstick: NEGATIVE
Ketones, ur: NEGATIVE mg/dL
Nitrite: NEGATIVE
Protein, ur: NEGATIVE mg/dL
Specific Gravity, Urine: 1.005 (ref 1.005–1.030)
pH: 6 (ref 5.0–8.0)

## 2023-03-21 LAB — BASIC METABOLIC PANEL
Anion gap: 11 (ref 5–15)
BUN: 16 mg/dL (ref 8–23)
CO2: 24 mmol/L (ref 22–32)
Calcium: 8.8 mg/dL — ABNORMAL LOW (ref 8.9–10.3)
Chloride: 103 mmol/L (ref 98–111)
Creatinine, Ser: 0.97 mg/dL (ref 0.44–1.00)
GFR, Estimated: 58 mL/min — ABNORMAL LOW (ref >60)
Glucose, Bld: 98 mg/dL (ref 70–99)
Potassium: 3.9 mmol/L (ref 3.5–5.1)
Sodium: 138 mmol/L (ref 135–145)

## 2023-03-21 LAB — HEPATIC FUNCTION PANEL
ALT: 14 U/L (ref 0–44)
AST: 17 U/L (ref 15–41)
Albumin: 3.9 g/dL (ref 3.5–5.0)
Alkaline Phosphatase: 55 U/L (ref 38–126)
Bilirubin, Direct: 0.2 mg/dL (ref 0.0–0.2)
Indirect Bilirubin: 1.4 mg/dL — ABNORMAL HIGH (ref 0.3–0.9)
Total Bilirubin: 1.6 mg/dL — ABNORMAL HIGH (ref 0.3–1.2)
Total Protein: 6.4 g/dL — ABNORMAL LOW (ref 6.5–8.1)

## 2023-03-21 LAB — LIPASE, BLOOD: Lipase: 38 U/L (ref 11–51)

## 2023-03-21 LAB — TROPONIN I (HIGH SENSITIVITY): Troponin I (High Sensitivity): 4 ng/L (ref <18)

## 2023-03-21 LAB — SARS CORONAVIRUS 2 BY RT PCR: SARS Coronavirus 2 by RT PCR: NEGATIVE

## 2023-03-21 MED ORDER — ONDANSETRON 4 MG PO TBDP
4.0000 mg | ORAL_TABLET | Freq: Once | ORAL | Status: AC
  Administered 2023-03-21: 4 mg via ORAL
  Filled 2023-03-21: qty 1

## 2023-03-21 MED ORDER — PANTOPRAZOLE SODIUM 40 MG PO TBEC: 40.0000 mg | DELAYED_RELEASE_TABLET | Freq: Every day | ORAL | 1 refills | Status: DC

## 2023-03-21 MED ORDER — ALUM & MAG HYDROXIDE-SIMETH 200-200-20 MG/5ML PO SUSP
30.0000 mL | Freq: Once | ORAL | Status: AC
  Administered 2023-03-21: 30 mL via ORAL
  Filled 2023-03-21: qty 30

## 2023-03-21 MED ORDER — ONDANSETRON 4 MG PO TBDP: 4.0000 mg | ORAL_TABLET | Freq: Three times a day (TID) | ORAL | 0 refills | Status: DC | PRN

## 2023-03-21 MED ORDER — LIDOCAINE VISCOUS HCL 2 % MT SOLN
15.0000 mL | Freq: Once | OROMUCOSAL | Status: AC
  Administered 2023-03-21: 15 mL via ORAL
  Filled 2023-03-21: qty 15

## 2023-03-21 NOTE — ED Triage Notes (Signed)
Pt to ED for vomiting and burning feeling in chest since Saturday (3d). Has been taking pepto bismol and "a little while pill and a little pink pill" and noticed yesterday that stools looked dark/black, but not sticky/tarry. No vomiting today, mild nausea. Takes 81mg  aspirin daily. Stats had short episode of R shoulder pain the other day.

## 2023-03-21 NOTE — ED Provider Notes (Signed)
[facility] Provider Note    Event Date/Time   First MD Initiated Contact with Patient 03/21/23 1547     (approximate)   History   Chief Complaint Emesis and Chest Pain   HPI  [name] is a 82 y.o. female with past medical history of hypertension, hyperlipidemia, stroke, GERD, asthma, and IBS who presents to the ED complaining of chest pain.  Patient reports that for the past 3 days she has been dealing with intermittent burning discomfort in her epigastrium and chest.  Patient states that the discomfort seems to be worse when she eats and she has had a couple episodes of vomiting after eating.  She was on a family vacation when the symptoms started and attempted to take Pepcid and Tums with minimal relief.  After eating breakfast this morning, she again felt nauseous with chest discomfort, but did not have any vomiting.  She denies any fevers, cough, or difficulty breathing.  She does not have any abdominal pain currently, primarily reports mild discomfort in her chest.  She denies any dysuria, diarrhea, or blood in her stool.     Physical Exam   Triage Vital Signs: ED Triage Vitals  Encounter Vitals Group     BP 03/21/23 1514 127/80     Systolic BP Percentile --      Diastolic BP Percentile --      Pulse Rate 03/21/23 1514 (!) 56     Resp 03/21/23 1514 16     Temp 03/21/23 1514 98.2 F (36.8 C)     Temp Source 03/21/23 1514 Oral     SpO2 03/21/23 1514 100 %     Weight 03/21/23 1513 180 lb (81.6 kg)     Height 03/21/23 1513 5\' 6"  (1.676 m)     Head Circumference --      Peak Flow --      Pain Score 03/21/23 1511 1     Pain Loc --      Pain Education --      Exclude from Growth Chart --     Most recent vital signs: Vitals:   03/21/23 1730 03/21/23 1800  BP: (!) 144/71 (!) 140/77  Pulse: (!) 41 (!) 58  Resp: 13 20  Temp:    SpO2: 99% 95%    Constitutional: Alert and oriented. Eyes: Conjunctivae are normal. Head: Atraumatic. Nose:  No congestion/rhinnorhea. Mouth/Throat: Mucous membranes are moist.  Cardiovascular: Normal rate, regular rhythm. Grossly normal heart sounds.  2+ radial pulses bilaterally. Respiratory: Normal respiratory effort.  No retractions. Lungs CTAB. Gastrointestinal: Soft and nontender. No distention. Musculoskeletal: No lower extremity tenderness nor edema.  Neurologic:  Normal speech and language. No gross focal neurologic deficits are appreciated.    ED Results / Procedures / Treatments   Labs (all labs ordered are listed, but only abnormal results are displayed) Labs Reviewed  BASIC METABOLIC PANEL - Abnormal; Notable for the following components:      Result Value   Calcium 8.8 (*)    GFR, Estimated 58 (*)    All other components within normal limits  URINALYSIS, ROUTINE W REFLEX MICROSCOPIC - Abnormal; Notable for the following components:   Color, Urine STRAW (*)    APPearance CLEAR (*)    Leukocytes,Ua SMALL (*)    All other components within normal limits  HEPATIC FUNCTION PANEL - Abnormal; Notable for the following components:   Total Protein 6.4 (*)    Total Bilirubin 1.6 (*)    Indirect  Bilirubin 1.4 (*)    All other components within normal limits  SARS CORONAVIRUS 2 BY RT PCR  CBC  LIPASE, BLOOD  TROPONIN I (HIGH SENSITIVITY)     EKG  ED ECG REPORT I, Chesley Noon, the attending physician, personally viewed and interpreted this ECG.   Date: 03/21/2023  EKG Time: 15:13  Rate: 55  Rhythm: sinus bradycardia  Axis: Normal  Intervals:none  ST&T Change: None  RADIOLOGY Chest x-ray reviewed and interpreted by me with no infiltrate, pneumo, or effusion.  PROCEDURES:  Critical Care performed: No  Procedures   MEDICATIONS ORDERED IN ED: Medications  ondansetron (ZOFRAN-ODT) disintegrating tablet 4 mg (4 mg Oral Given 03/21/23 1646)  alum & mag hydroxide-simeth (MAALOX/MYLANTA) 200-200-20 MG/5ML suspension 30 mL (30 mLs Oral Given 03/21/23 1646)    And   lidocaine (XYLOCAINE) 2 % viscous mouth solution 15 mL (15 mLs Oral Given 03/21/23 1646)     IMPRESSION / MDM / ASSESSMENT AND PLAN / ED COURSE  I reviewed the triage vital signs and the nursing notes.                              82 y.o. female with past medical history of hypertension, hyperlipidemia, stroke, asthma, GERD, and IBS who presents to the ED complaining of intermittent epigastric and chest discomfort with nausea and vomiting over the past 3 days.  Patient's presentation is most consistent with acute presentation with potential threat to life or bodily function.  Differential diagnosis includes, but is not limited to, gastritis, pancreatitis, hepatitis, cholecystitis, biliary colic, GERD, ACS, PE, pneumonia, pneumothorax, musculoskeletal pain.  Patient nontoxic-appearing and in no acute distress, vital signs remarkable for mild bradycardia but otherwise reassuring.  EKG shows no evidence of arrhythmia or ischemia and troponin within normal limits.  Symptoms are atypical for ACS and I doubt PE, seems more consistent with GI source and likely GERD.  She has no abdominal tenderness at this time, will add on LFTs and lipase.  No significant anemia, leukocytosis, tract abnormality, or AKI noted.  We will treat symptomatically with ODT Zofran and GI cocktail, reassess following additional labs.  Lipase within normal limits, LFTs show mild elevation in bilirubin however this is similar compared to previous.  Patient's symptoms have resolved following GI cocktail and she is tolerating oral intake without difficulty.  She is appropriate for discharge home with PCP follow-up, we will switch her PPI from omeprazole to pantoprazole.  She was counseled to return to the ED for new or worsening symptoms.  Patient agrees with plan.      FINAL CLINICAL IMPRESSION(S) / ED DIAGNOSES   Final diagnoses:  Atypical chest pain  Gastroesophageal reflux disease with esophagitis without hemorrhage      Rx / DC Orders   ED Discharge Orders          Ordered    pantoprazole (PROTONIX) 40 MG tablet  Daily        03/21/23 1825             Note:  This document was prepared using Dragon voice recognition software and may include unintentional dictation errors.   Chesley Noon, MD 03/21/23 [phone]

## 2023-03-22 ENCOUNTER — Telehealth: Payer: Self-pay

## 2023-03-22 NOTE — Telephone Encounter (Signed)
Patient states they gave her a covid test right before she left the ED yesterday and she would like to know the results of the test.  Patient states her dog has had a heart attack, seizures, or something and she is at the vet.  Patient states she needs to know the result of her covid test.  I spoke with Vertis Kelch, CMA, and she states patient will need to call the ED to get the results.  I relayed the message to patient and gave her the phone number for the switchboard.  I let her know she can ask them to transfer her to the ED.

## 2023-03-23 NOTE — Telephone Encounter (Signed)
Left message to return call to our office.  

## 2023-03-24 ENCOUNTER — Other Ambulatory Visit: Payer: Self-pay | Admitting: Family Medicine

## 2023-03-24 DIAGNOSIS — K5900 Constipation, unspecified: Secondary | ICD-10-CM

## 2023-03-24 MED ORDER — SENNOSIDES 8.6 MG PO TABS
1.0000 | ORAL_TABLET | Freq: Every day | ORAL | 0 refills | Status: DC
Start: 2023-03-24 — End: 2023-04-21

## 2023-03-24 MED ORDER — POLYETHYLENE GLYCOL 3350 17 GM/SCOOP PO POWD
17.0000 g | Freq: Two times a day (BID) | ORAL | 1 refills | Status: DC | PRN
Start: 2023-03-24 — End: 2023-04-21

## 2023-03-24 MED ORDER — METAMUCIL SMOOTH TEXTURE 58.6 % PO POWD
1.0000 | Freq: Three times a day (TID) | ORAL | 12 refills | Status: DC
Start: 2023-03-24 — End: 2023-04-26

## 2023-03-24 NOTE — Telephone Encounter (Signed)
Called pt and informed her of this information.

## 2023-03-24 NOTE — Progress Notes (Signed)
Patient spoke with CMA and complains of increased strainingg while having BM. Last BM 2 days ago, still has feeling of rectal pain when trying to move bowels.  Was recently seen in ED, 09/03 but had to leave secondary to home emergency. Labs reassuring, Chest xray normal. Urine positive for small amount leuks but  asymptomatic. No abdominal imaging at that time Recommend Miralax 2 scoops every 4 hours until BM Senna 1-2 tablets BID as needed Metamucil daily Increase water intake If no improvement need to be evaluated in ED If starts to have loose stool to diarrhea, instructed to stop senna and miralax.    Orders placed for above  Dana Allan, MD

## 2023-03-24 NOTE — Telephone Encounter (Signed)
Pt called back and want to know if she can take dulcolax

## 2023-03-27 NOTE — Telephone Encounter (Signed)
Left message to return call to our office.  

## 2023-03-28 ENCOUNTER — Ambulatory Visit (INDEPENDENT_AMBULATORY_CARE_PROVIDER_SITE_OTHER): Payer: PPO | Admitting: Vascular Surgery

## 2023-03-29 DIAGNOSIS — Z6829 Body mass index (BMI) 29.0-29.9, adult: Secondary | ICD-10-CM | POA: Diagnosis not present

## 2023-03-29 DIAGNOSIS — J029 Acute pharyngitis, unspecified: Secondary | ICD-10-CM | POA: Diagnosis not present

## 2023-03-31 NOTE — Telephone Encounter (Signed)
LMTCB

## 2023-04-04 DIAGNOSIS — Z23 Encounter for immunization: Secondary | ICD-10-CM | POA: Diagnosis not present

## 2023-04-05 NOTE — Telephone Encounter (Signed)
Left message to return call to our office.  

## 2023-04-06 ENCOUNTER — Telehealth: Payer: Self-pay

## 2023-04-06 NOTE — Telephone Encounter (Signed)
Called pt and she stated that she is doing good.

## 2023-04-06 NOTE — Telephone Encounter (Signed)
Please call the schedule MWV

## 2023-04-07 ENCOUNTER — Telehealth: Payer: Self-pay

## 2023-04-07 NOTE — Telephone Encounter (Signed)
Transition Care Management Unsuccessful Follow-up Telephone Call  Date of discharge and from where:  [place] 9/3  Attempts:  1st Attempt  Reason for unsuccessful TCM follow-up call:  No answer/busy   Lenard Forth [place]  [facility], [facility] Guide, Phone: [phone] Website: [email or web address]

## 2023-04-10 ENCOUNTER — Telehealth: Payer: Self-pay

## 2023-04-10 NOTE — Telephone Encounter (Signed)
Transition Care Management Unsuccessful Follow-up Telephone Call  Date of discharge and from where:  9/3 Attempts:  2nd Attempt  Reason for unsuccessful TCM follow-up call:  No answer/busy   [name] [place]  [facility], [facility] Guide, Phone: [phone] Website: [email or web address]

## 2023-04-12 NOTE — Telephone Encounter (Signed)
Patient just called to schedule an AWS. Can someone call her back at [phone]. To schedule her appointment.

## 2023-04-13 ENCOUNTER — Other Ambulatory Visit: Payer: Self-pay

## 2023-04-13 DIAGNOSIS — F339 Major depressive disorder, recurrent, unspecified: Secondary | ICD-10-CM

## 2023-04-13 MED ORDER — CITALOPRAM HYDROBROMIDE 20 MG PO TABS
20.0000 mg | ORAL_TABLET | Freq: Every day | ORAL | 3 refills | Status: DC
Start: 2023-04-13 — End: 2023-08-10

## 2023-04-14 NOTE — Telephone Encounter (Signed)
LVMTCB

## 2023-04-18 ENCOUNTER — Ambulatory Visit (INDEPENDENT_AMBULATORY_CARE_PROVIDER_SITE_OTHER): Payer: PPO

## 2023-04-18 ENCOUNTER — Telehealth: Payer: Self-pay | Admitting: Gastroenterology

## 2023-04-18 VITALS — Ht 66.0 in | Wt 182.3 lb

## 2023-04-18 DIAGNOSIS — Z780 Asymptomatic menopausal state: Secondary | ICD-10-CM | POA: Diagnosis not present

## 2023-04-18 DIAGNOSIS — Z0000 Encounter for general adult medical examination without abnormal findings: Secondary | ICD-10-CM | POA: Diagnosis not present

## 2023-04-18 NOTE — Telephone Encounter (Signed)
Inbound call from patient, states she is vomiting multiple times a day, states she is having unintentional weight loss. States she is vomiting from acid reflux, states she went to the emergency room from [facility], states she's been having symptoms since Labor day. She would like to discuss symptoms with a nurse, states she cannot wait until December for an appointment.

## 2023-04-18 NOTE — Telephone Encounter (Signed)
Patient calls today with complaints of several weeks increasing heartburn and intermittent "tightness in the esophagus." She states that her symptoms started while on vacation with family around Labor day. Says she was seen in the [facility] ER and sent home. Patient states that she has been having frequent episodes of vomiting, though not every day. States she gets nausea. No factors seem to precipitate the symptoms. She describes some occasional "stabbing" pain in the lower abdomen but says this is infrequent. Patient states that she was recently changed from omeprazole to pantoprazole once daily dosing by the ER physician. This change has not helped symptoms. Was given zofran at hospital and thinks it helped but she is not 100 percent sure. In regards to reflux diet, patient says she does not often eat spicy, citrus or fatty foods etc but does not follow diet strictly. Patient is advised to begin following strict antireflux measures including diet adjustment, small, frequent meals, HOB elevation, sitting upright for 3-4 hours after eating etc.   In addition, patient complains of multiple episodes of fecal incontinence and diarrhea recently. She denies any bleeding. She does say that up until her ER visit, she was taking imodium daily along with colestid. However, says that she was given medication at the ER that caused her to become impacted (this was self diagnosed by patient). Patient says she had to manually disimpact herself following miralax, enemas and warm liquids. She says she discontinue imodium but has remained on colestid twice daily. She says that she would like colestid in powder form rather than pill form as it "breaks apart" easily and her cousin told her about a powder form she could take. I have asked that patient to increase water intake to at least 64 ounces per day and increase exercise/physical activity to aid in movement of stool through the colon and have recommended the addition of a  fiber supplement (metamucil or benefiber) daily for now.   Of note: patient was to begin pelvic floor therapy for fecal and urinary incontinence but states she has been too busy and has not done this yet.  Dr Adela Lank- Any additional thoughts?

## 2023-04-18 NOTE — Patient Instructions (Signed)
[name] , Thank you for taking time to come for your Medicare Wellness Visit. I appreciate your ongoing commitment to your health goals. Please review the following plan we discussed and let me know if I can assist you in the future.   Referrals/Orders/Follow-Ups/Clinician Recommendations: Please update your vaccines as discussed. Bone Density/Dexa ordered  This is a list of the screening recommended for you and due dates:  Health Maintenance  Topic Date Due   Zoster (Shingles) Vaccine (2 of 2) 08/11/2011   DTaP/Tdap/Td vaccine (2 - Tdap) 10/19/2022   Flu Shot  02/16/2023   COVID-19 Vaccine (7 - 2023-24 season) 05/30/2023   Mammogram  02/16/2024   Medicare Annual Wellness Visit  04/17/2024   Pneumonia Vaccine  Completed   DEXA scan (bone density measurement)  Completed   HPV Vaccine  Aged Out   Hepatitis C Screening  Discontinued    Advanced directives: (In Chart) A copy of your advanced directives are scanned into your chart should your provider ever need it.  Next Medicare Annual Wellness Visit scheduled for next year: Yes 04/23/24@ 11:15

## 2023-04-18 NOTE — Telephone Encounter (Signed)
Thanks for chatting with her [name], a lot going on here.  If she wants to change Colestid to cholestyramine we can do that, 4 g twice daily as needed.  I recommend she proceed with pelvic floor PT for some of these issues I think this will help both her bowel and urinary issues.  If she has just recently switched from omeprazole to pantoprazole can give that a bit more time.  We can give her some liquid Carafate 10 cc every 8 hours to use as needed for breakthrough.  May be best to book her a follow-up visit with me or APP to discuss some of these issues further if you can help coordinate, I think too much to handle over the phone or with MyChart message at this point.  Thanks

## 2023-04-18 NOTE — Progress Notes (Signed)
Subjective:   [name] is a 82 y.o. female who presents for Medicare Annual (Subsequent) preventive examination.  Visit Complete: Virtual  I connected with  Margarita Mail on 04/18/23 by a audio enabled telemedicine application and verified that I am speaking with the correct person using two identifiers.  Patient Location: Home  Provider Location: Office/Clinic  I discussed the limitations of evaluation and management by telemedicine. The patient expressed understanding and agreed to proceed.  Because this visit was a virtual/telehealth visit, some criteria may be missing or patient reported. Any vitals not documented were not able to be obtained and vitals that have been documented are patient reported.    Cardiac Risk Factors include: advanced age (>53men, >31 women);dyslipidemia;hypertension     Objective:    Today's Vitals   04/18/23 1253  Weight: 182 lb 5 oz (82.7 kg)  Height: 5\' 6"  (1.676 m)   Body mass index is 29.43 kg/m.     04/18/2023    1:14 PM 03/21/2023    3:12 PM 12/12/2022    6:14 AM 11/27/2022    5:11 PM 09/05/2022    9:35 AM 11/10/2021   12:59 PM 02/24/2021    9:43 AM  Advanced Directives  Does Patient Have a Medical Advance Directive? Yes Yes Yes No;Yes Yes Yes Yes  Type of Estate agent of [place];Living will Healthcare Power of [place];Living will -- Healthcare Power of [place];Living will Healthcare Power of [place];Living will;Out of facility DNR (pink MOST or yellow form) Healthcare Power of [place];Living will Healthcare Power of [place];Living will  Does patient want to make changes to medical advance directive? No - Patient declined     No - Patient declined   Copy of Healthcare Power of Attorney in Chart? Yes - validated most recent copy scanned in chart (See row information)     Yes - validated most recent copy scanned in chart (See row information)     Current Medications (verified) Outpatient Encounter Medications  as of 04/18/2023  Medication Sig   albuterol (PROAIR HFA) 108 (90 Base) MCG/ACT inhaler Inhale 1-2 puffs into the lungs every 6 (six) hours as needed for wheezing or shortness of breath.   aspirin 81 MG tablet TAKE 1 TABLET BY MOUTH EVERY DAY   atorvastatin (LIPITOR) 20 MG tablet Take 1 tablet (20 mg total) by mouth daily.   Biotin 1000 MCG tablet Take 1,000 mcg by mouth daily.   BREO ELLIPTA 100-25 MCG/ACT AEPB Inhale 1 puff into the lungs daily.   Calcium 600-200 MG-UNIT tablet Take 1 tablet daily by mouth.   Cholecalciferol (VITAMIN D3) 2000 UNITS capsule Take 2,000 Units by mouth daily.   citalopram (CELEXA) 20 MG tablet Take 1 tablet (20 mg total) by mouth daily.   colestipol (COLESTID) 1 g tablet Take 1 tablet (1 g total) by mouth 2 (two) times daily.   FOLIC ACID PO Take by mouth daily.   levothyroxine (SYNTHROID) 75 MCG tablet Take 1 tablet (75 mcg total) by mouth daily before breakfast.   loperamide (IMODIUM A-D) 2 MG tablet Take 1 tablet (2 mg total) by mouth daily. (Patient taking differently: Take 2 mg by mouth daily as needed.)   Magnesium 250 MG TABS Take by mouth.   olmesartan (BENICAR) 20 MG tablet Take 0.5 tablets (10 mg total) by mouth in the morning and at bedtime. D/c 5 mg (10 mg bid of 5 mg)   Omega-3 Fatty Acids (FISH OIL) 1000 MG CAPS Take 1,000 mg by  mouth daily.    ondansetron (ZOFRAN-ODT) 4 MG disintegrating tablet Take 1 tablet (4 mg total) by mouth every 8 (eight) hours as needed for nausea or vomiting.   pantoprazole (PROTONIX) 40 MG tablet Take 1 tablet (40 mg total) by mouth daily.   polyethylene glycol powder (GLYCOLAX/MIRALAX) 17 GM/SCOOP powder Take 17 g by mouth 2 (two) times daily as needed.   Potassium (POTASSIMIN PO) Take by mouth.   psyllium (METAMUCIL SMOOTH TEXTURE) 58.6 % powder Take 1 packet by mouth 3 (three) times daily.   senna (SENOKOT) 8.6 MG tablet Take 1-2 tablets (8.6-17.2 mg total) by mouth daily.   vitamin B-12 (CYANOCOBALAMIN) 1000 MCG  tablet Take 2,000 mcg by mouth daily. 2 tabs   No facility-administered encounter medications on file as of 04/18/2023.    Allergies (verified) Epinephrine, Penicillins, Other, and Valium [diazepam]   History: Past Medical History:  Diagnosis Date   Acid reflux    Allergy    Anemia    Anxiety    in past   Arthritis    all over   Asthma    Atypical moles    Bronchitis    Bronchospasm    reactive with perfume/cologne and smoke   CAD (coronary artery disease)    patient denies on preop of 10/24    Candida esophagitis (HCC)    Carotid artery calcification, bilateral 04/2016   Carpal tunnel syndrome of left wrist 12/03/2019   Cataract    Cervical stenosis of spine    Chronic insomnia    Complication of anesthesia    shoulder surgery- not all the way asleep   COVID-19 02/04/2021   also 10/3 or 04/20/21 sxs started 04/05/21   Depression    in past   Diverticulitis    Diverticulosis    Dizziness    Elevated liver enzymes    Family history of adverse reaction to anesthesia    sister- has problems with nausea and vomiting    Fatigue    Fatty liver    Generalized headache    migraines   GERD (gastroesophageal reflux disease)    Gross hematuria 08/18/2022   Hereditary and idiopathic peripheral neuropathy 05/11/2015   History of chemotherapy    topical cream for h/o skin CA   History of kidney stones    History of neuropathy    FH Dr. Allena Katz neurology   History of vertigo    Hx of migraines    Hyperlipidemia    Hypothyroidism    Insomnia    Irritable bowel syndrome    LLQ abdominal pain    Melanoma (HCC) 2013   Melanoma (HCC)    x2 stg II melanoma   Neuropathy, peripheral    Peripheral neuropathy    Pneumonia    hx of x 2   PONV (postoperative nausea and vomiting)    Pre-diabetes    Retina disorder    left   SCC (squamous cell carcinoma)    skin    Skin cancer 07/17/2019   SCC left leg s/p mohs   SCC 05/01/18 left medial mid leg, right post mid leg 06/08/18,  left mid well 04/10/19  Dr. Emily Filbert derm, Dr. Glennis Brink mohs       Stroke [facility])    ? TIA per Dr V per patient    Thyroid disease    Urine incontinence    Viral URI with cough 06/09/2022   Vitamin D deficiency    Weight gain    Past Surgical History:  Procedure Laterality Date   ABLATION Bilateral    Dr. Wyn Quaker 12/2020 veins laser ablation of both great saphenous veins in a staged fashion.   BILATERAL SALPINGOOPHORECTOMY     ovarian cyst Dr. Dareen Piano and Dr. Allie Dimmer 2008   BREAST SURGERY     in 2014 bx breast calcifications   BREAST SURGERY     augmentation with saline Dr. Karmen Bongo 1995    CHOLECYSTECTOMY N/A 01/28/2015   Procedure: LAPAROSCOPIC CHOLECYSTECTOMY WITH INTRAOPERATIVE CHOLANGIOGRAM;  Surgeon: Chevis Pretty III, MD;  Location: [facility] OR;  Service: General;  Laterality: N/A;   CLOSED MANIPULATION SHOULDER     rt   COLONOSCOPY WITH PROPOFOL N/A 11/20/2012   Procedure: COLONOSCOPY WITH PROPOFOL;  Surgeon: Charolett Bumpers, MD;  Location: [facility] ENDOSCOPY;  Service: Endoscopy;  Laterality: N/A;   colonscopy     May 2014   JOINT REPLACEMENT     RTK   KNEE ARTHROSCOPY     left knee miniscus tear repair     MELANOMA EXCISION WITH SENTINEL LYMPH NODE BIOPSY  03/18/2011   Back, nodes neg   OOPHORECTOMY     ROTATOR CUFF REPAIR     left   SHOULDER SURGERY     right and left    SKIN CANCER EXCISION     multiple   TOTAL HIP ARTHROPLASTY     TOTAL KNEE ARTHROPLASTY     right 2010 and left in 2018    TOTAL KNEE ARTHROPLASTY Left 05/16/2017   Procedure: LEFT TOTAL KNEE ARTHROPLASTY;  Surgeon: Durene Romans, MD;  Location: [facility] ORS;  Service: Orthopedics;  Laterality: Left;  70 mins   TUBAL LIGATION     TUMOR REMOVAL     ovary   Family History  Problem Relation Age of Onset   Hypertension Mother    Stroke Mother    Hyperlipidemia Mother    Heart disease Father    Diabetes Father    Hypertension Father    Hyperlipidemia Father    Early death Father    Diabetes Sister    Heart  disease Sister        heart murmur   Arthritis Sister    COPD Sister    Hyperlipidemia Sister    Hypertension Sister    Colon polyps Sister        adenomous   Arthritis Sister    Heart disease Sister    Hyperlipidemia Sister    Hypertension Sister    Cancer Sister    Dementia Sister    Tremor Sister    Stroke Maternal Grandmother    Colon cancer Maternal Grandfather    Cancer Maternal Grandfather    Early death Maternal Grandfather    Early death Paternal Grandmother    Stroke Paternal Grandmother    Heart disease Daughter    Hyperlipidemia Daughter    Hypertension Daughter    Heart disease Son    Hyperlipidemia Son    Alcohol abuse Son    Depression Son    Diabetes Son    Pancreatic cancer Maternal Uncle    Alzheimer's disease Paternal Aunt    Alzheimer's disease Paternal Aunt    Alzheimer's disease Paternal Aunt    Alzheimer's disease Paternal Aunt    Cancer Other        2 paternal uncles and 1 maternal uncle colon cancer   Esophageal cancer Neg Hx    Stomach cancer Neg Hx    Social History   Socioeconomic History   Marital status: Divorced  Spouse name: Not on file   Number of children: 3   Years of education: College   Highest education level: Some college, no degree  Occupational History   Occupation: Retired    Associate Professor: RETIRED  Tobacco Use   Smoking status: Never   Smokeless tobacco: Never  Vaping Use   Vaping status: Never Used  Substance and Sexual Activity   Alcohol use: Yes    Alcohol/week: 1.0 standard drink of alcohol    Types: 1 Cans of beer per week    Comment: once a week beer or wine   Drug use: No   Sexual activity: Not Currently  Other Topics Concern   Not on file  Social History Narrative   Patient lives at home alone in a one story home.   Has 3 children (2 daughters and 1 son)    Retired from [employer].   Divorced since 1992    Caffeine Use: 1 cup daily and a soft drink occasionally    No guns    Wears seat  belt     Lives with cat    Left handed   Social Determinants of Health   Financial Resource Strain: Low Risk  (04/18/2023)   Overall Financial Resource Strain (CARDIA)    Difficulty of Paying Living Expenses: Not hard at all  Food Insecurity: No Food Insecurity (04/18/2023)   Hunger Vital Sign    Worried About Running Out of Food in the Last Year: Never true    Ran Out of Food in the Last Year: Never true  Transportation Needs: No Transportation Needs (04/18/2023)   PRAPARE - Administrator, Civil Service (Medical): No    Lack of Transportation (Non-Medical): No  Physical Activity: Inactive (04/18/2023)   Exercise Vital Sign    Days of Exercise per Week: 0 days    Minutes of Exercise per Session: 0 min  Stress: Stress Concern Present (04/18/2023)   [employer] of Occupational Health - Occupational Stress Questionnaire    Feeling of Stress : To some extent  Social Connections: Moderately Integrated (04/18/2023)   Social Connection and Isolation Panel [NHANES]    Frequency of Communication with Friends and Family: More than three times a week    Frequency of Social Gatherings with Friends and Family: Three times a week    Attends Religious Services: More than 4 times per year    Active Member of Clubs or Organizations: Yes    Attends Engineer, structural: More than 4 times per year    Marital Status: Divorced    Tobacco Counseling Counseling given: Not Answered   Clinical Intake:  Pre-visit preparation completed: Yes  Pain : No/denies pain     BMI - recorded: 29.43 Nutritional Status: BMI 25 -29 Overweight Nutritional Risks: Nausea/ vomitting/ diarrhea (diarrhea off and on for over a year) Diabetes: No  How often do you need to have someone help you when you read instructions, pamphlets, or other written materials from your doctor or pharmacy?: 1 - Never  Interpreter Needed?: No  Information entered by :: R. Shenay Torti LPN   Activities of Daily  Living    04/18/2023    1:00 PM  In your present state of health, do you have any difficulty performing the following activities:  Hearing? 0  Vision? 0  Difficulty concentrating or making decisions? 0  Walking or climbing stairs? 1  Dressing or bathing? 0  Doing errands, shopping? 0  Preparing Food and eating ?  N  Using the Toilet? N  In the past six months, have you accidently leaked urine? Y  Comment wears depends  Do you have problems with loss of bowel control? Y  Comment sees GI  Managing your Medications? N  Managing your Finances? N  Housekeeping or managing your Housekeeping? N    Patient Care Team: Dana Allan, MD as PCP - General (Family Medicine) Patient, No Pcp Per (General Practice) Glendale Chard, DO as Consulting Physician (Neurology)  Indicate any recent Medical Services you may have received from other than [facility] providers in the past year (date may be approximate).     Assessment:   This is a routine wellness examination for [place].  Hearing/Vision screen Hearing Screening - Comments:: No issues Vision Screening - Comments:: No glasses   Goals Addressed             This Visit's Progress    Patient Stated       Wants to get her try to get her health issues under control       Depression Screen    04/18/2023    1:07 PM 12/22/2022   11:53 AM 11/30/2022    3:13 PM 08/18/2022    8:54 AM 05/30/2022    4:28 PM 05/25/2022    1:56 PM 04/21/2022    1:44 PM  PHQ 2/9 Scores  PHQ - 2 Score 4 0 0 2 1 2  0  PHQ- 9 Score 11 0 7 6       Fall Risk    04/18/2023    1:02 PM 12/22/2022   11:53 AM 09/05/2022    9:35 AM 08/18/2022    8:52 AM 05/30/2022    4:27 PM  Fall Risk   Falls in the past year? 1 0 0 0 0  Number falls in past yr: 0 0 0 0 0  Injury with Fall? 1 0 0 0 0  Comment went to ER      Risk for fall due to : History of fall(s) No Fall Risks  Impaired balance/gait No Fall Risks  Follow up Falls evaluation completed;Falls prevention discussed Falls  evaluation completed Falls evaluation completed Falls evaluation completed Falls evaluation completed    MEDICARE RISK AT HOME: Medicare Risk at Home Any stairs in or around the home?: No If so, are there any without handrails?: No Home free of loose throw rugs in walkways, pet beds, electrical cords, etc?: Yes Adequate lighting in your home to reduce risk of falls?: Yes Life alert?: No Use of a cane, walker or w/c?: Yes (cane) Grab bars in the bathroom?: No Shower chair or bench in shower?: Yes Elevated toilet seat or a handicapped toilet?: Yes  Cognitive Function:    10/31/2016    3:00 PM 04/27/2016   11:00 AM  MMSE - Mini Mental State Exam  Not completed: Refused Unable to complete      09/22/2017    4:19 PM 12/17/2015    4:07 PM 12/17/2015    3:41 PM  Montreal Cognitive Assessment   Visuospatial/ Executive (0/5) 4 4 4   Naming (0/3) 3 3 3   Attention: Read list of digits (0/2) 2 2 2   Attention: Read list of letters (0/1) 1 1 1   Attention: Serial 7 subtraction starting at 100 (0/3) 2 3 3   Language: Repeat phrase (0/2) 2 2 2   Language : Fluency (0/1) 0 1 1  Abstraction (0/2) 2 2 2   Delayed Recall (0/5) 4 3 3   Orientation (0/6) 6  6 6  Total 26 27 27   Adjusted Score (based on education) 26  27      04/18/2023    1:14 PM 08/10/2020   11:04 AM 08/08/2019   10:53 AM  6CIT Screen  What Year? 0 points 0 points 0 points  What month? 0 points 0 points 0 points  What time? 0 points 0 points 0 points  Count back from 20 0 points 0 points 0 points  Months in reverse 0 points 0 points 0 points  Repeat phrase 0 points 0 points 0 points  Total Score 0 points 0 points 0 points    Immunizations Immunization History  Administered Date(s) Administered   Fluad Quad(high Dose 65+) 04/25/2019, 04/14/2020, 06/24/2021   Influenza Split 04/18/2017   Influenza, High Dose Seasonal PF 04/18/2017, 04/12/2018   Influenza, Quadrivalent, Recombinant, Inj, Pf 05/03/2018   Influenza,inj,Quad  PF,6+ Mos 04/21/2022   Influenza-Unspecified 05/03/2018   Moderna Covid-19 Fall Seasonal Vaccine 45yrs & older 04/04/2023   PFIZER Comirnaty(Gray Top)Covid-19 Tri-Sucrose Vaccine 12/03/2020   PFIZER(Purple Top)SARS-COV-2 Vaccination 08/12/2019, 09/02/2019, 03/16/2020   Pfizer Covid-19 Vaccine Bivalent Booster 38yrs & up 08/09/2021   Pneumococcal Conjugate-13 05/15/2014   Pneumococcal Polysaccharide-23 12/16/2005, 05/18/2016   Td 10/18/2012   Zoster Recombinant(Shingrix) 06/16/2011    TDAP status: Up to date  Flu Vaccine status: Due, Education has been provided regarding the importance of this vaccine. Advised may receive this vaccine at local pharmacy or Health Dept. Aware to provide a copy of the vaccination record if obtained from local pharmacy or Health Dept. Verbalized acceptance and understanding.  Pneumococcal vaccine status: Up to date  Covid-19 vaccine status: Completed vaccines  Qualifies for Shingles Vaccine? Yes   Zostavax completed Yes   Shingrix Completed?: No.    Education has been provided regarding the importance of this vaccine. Patient has been advised to call insurance company to determine out of pocket expense if they have not yet received this vaccine. Advised may also receive vaccine at local pharmacy or Health Dept. Verbalized acceptance and understanding.  Screening Tests Health Maintenance  Topic Date Due   Zoster Vaccines- Shingrix (2 of 2) 08/11/2011   DTaP/Tdap/Td (2 - Tdap) 10/19/2022   Medicare Annual Wellness (AWV)  11/11/2022   INFLUENZA VACCINE  02/16/2023   COVID-19 Vaccine (7 - 2023-24 season) 05/30/2023   Pneumonia Vaccine 68+ Years old  Completed   DEXA SCAN  Completed   HPV VACCINES  Aged Out   Hepatitis C Screening  Discontinued    Health Maintenance  Health Maintenance Due  Topic Date Due   Zoster Vaccines- Shingrix (2 of 2) 08/11/2011   DTaP/Tdap/Td (2 - Tdap) 10/19/2022   Medicare Annual Wellness (AWV)  11/11/2022   INFLUENZA  VACCINE  02/16/2023    Colorectal cancer screening: No longer required. Also has a problem with her colon  Mammogram status: Completed 02/2023. Repeat every year  Bone Density status: Ordered 04/18/23. Pt provided with contact info and advised to call to schedule appt.  Lung Cancer Screening: (Low Dose CT Chest recommended if Age 59-80 years, 20 pack-year currently smoking OR have quit w/in 15years.) does not qualify.     Additional Screening:  Hepatitis C Screening: does not qualify; Completed NA age  Vision Screening: Recommended annual ophthalmology exams for early detection of glaucoma and other disorders of the eye. Is the patient up to date with their annual eye exam?  Yes  Who is the provider or what is the name of the office in which  the patient attends annual eye exams? [facility] If pt is not established with a provider, would they like to be referred to a provider to establish care? No .   Dental Screening: Recommended annual dental exams for proper oral hygiene   Community Resource Referral / Chronic Care Management: CRR required this visit?  No   CCM required this visit?  No     Plan:     I have personally reviewed and noted the following in the patient's chart:   Medical and social history Use of alcohol, tobacco or illicit drugs  Current medications and supplements including opioid prescriptions. Patient is not currently taking opioid prescriptions. Functional ability and status Nutritional status Physical activity Advanced directives List of other physicians Hospitalizations, surgeries, and ER visits in previous 12 months Vitals Screenings to include cognitive, depression, and falls Referrals and appointments  In addition, I have reviewed and discussed with patient certain preventive protocols, quality metrics, and best practice recommendations. A written personalized care plan for preventive services as well as general preventive health  recommendations were provided to patient.     Sydell Axon, LPN   14/01/8294   After Visit Summary: (MyChart) Due to this being a telephonic visit, the after visit summary with patients personalized plan was offered to patient via MyChart   Nurse Notes: None

## 2023-04-19 NOTE — Telephone Encounter (Signed)
Left message for patient to call back  

## 2023-04-20 MED ORDER — CHOLESTYRAMINE 4 G PO PACK: 4.0000 g | PACK | Freq: Two times a day (BID) | ORAL | 1 refills | Status: DC | PRN

## 2023-04-20 MED ORDER — SUCRALFATE 1 G PO TABS: 1.0000 g | ORAL_TABLET | Freq: Three times a day (TID) | ORAL | 1 refills | Status: DC | PRN

## 2023-04-20 NOTE — Telephone Encounter (Signed)
I have spoken to patient to advise of response and recommendations by Dr Adela Lank. Advised of rx changes/additions. New rx's sent to pharmacy. Also advised we would like to see her for follow up in the office to further discuss her symptoms. Patient verbalizes understanding of appointment with Boone Master, PA-C 04/26/23.

## 2023-04-21 ENCOUNTER — Ambulatory Visit (INDEPENDENT_AMBULATORY_CARE_PROVIDER_SITE_OTHER): Payer: PPO | Admitting: Internal Medicine

## 2023-04-21 ENCOUNTER — Encounter: Payer: Self-pay | Admitting: Internal Medicine

## 2023-04-21 VITALS — BP 110/68 | HR 74 | Ht 66.0 in | Wt 186.6 lb

## 2023-04-21 DIAGNOSIS — E038 Other specified hypothyroidism: Secondary | ICD-10-CM | POA: Diagnosis not present

## 2023-04-21 DIAGNOSIS — Z1389 Encounter for screening for other disorder: Secondary | ICD-10-CM | POA: Diagnosis not present

## 2023-04-21 DIAGNOSIS — I10 Essential (primary) hypertension: Secondary | ICD-10-CM | POA: Diagnosis not present

## 2023-04-21 DIAGNOSIS — E559 Vitamin D deficiency, unspecified: Secondary | ICD-10-CM | POA: Diagnosis not present

## 2023-04-21 DIAGNOSIS — E782 Mixed hyperlipidemia: Secondary | ICD-10-CM | POA: Diagnosis not present

## 2023-04-21 DIAGNOSIS — E538 Deficiency of other specified B group vitamins: Secondary | ICD-10-CM | POA: Diagnosis not present

## 2023-04-21 LAB — POCT URINALYSIS DIPSTICK
Bilirubin, UA: NEGATIVE
Blood, UA: NEGATIVE
Glucose, UA: NEGATIVE
Ketones, UA: NEGATIVE
Leukocytes, UA: NEGATIVE
Nitrite, UA: NEGATIVE
Protein, UA: NEGATIVE
Spec Grav, UA: 1.025 (ref 1.010–1.025)
Urobilinogen, UA: 0.2 U/dL
pH, UA: 5.5 (ref 5.0–8.0)

## 2023-04-21 NOTE — Progress Notes (Signed)
New Patient Office Visit  Subjective    Patient ID: SHAKAIRA VILLAVERDE, female    DOB: 05-12-1941  Age: 82 y.o. MRN: [id number]  CC:  Chief Complaint  Patient presents with   Establish Care    NPE    HPI BERNARDINE STERRY presents to establish care Previous Primary Care provider/office:   she does have additional concerns to discuss today.   Patient comes in to establish primary care.  Has extensive medical and surgical history as listed and is currently being managed by gastroenterology, cardiology and pulmonology as well. Recently she is experiencing increased shortness of breath upon exertion.  She will be seeing her pulmonologist soon for further adjustment of her medications for asthma.  If her symptoms persist then she will get a follow-up with her cardiologist. She is having recurrent heartburn despite being on medications and will discuss further with her gastroenterologist at her next visit. She is due for a DEXA scan, as well as blood work. She will return fasting for her labs. Urine dipstick done today in our office is negative. Her PHQ-9/GAD score is 9/6.  Recently had some bad news - but is on Citalopram and thinks that it is adequately controlling her symptoms.    Outpatient Encounter Medications as of 04/21/2023  Medication Sig   albuterol (PROAIR HFA) 108 (90 Base) MCG/ACT inhaler Inhale 1-2 puffs into the lungs every 6 (six) hours as needed for wheezing or shortness of breath.   aspirin 81 MG tablet TAKE 1 TABLET BY MOUTH EVERY DAY   atorvastatin (LIPITOR) 20 MG tablet Take 1 tablet (20 mg total) by mouth daily.   Biotin 1000 MCG tablet Take 1,000 mcg by mouth daily.   BREO ELLIPTA 100-25 MCG/ACT AEPB Inhale 1 puff into the lungs daily.   Calcium 600-200 MG-UNIT tablet Take 1 tablet daily by mouth.   Cholecalciferol (VITAMIN D3) 2000 UNITS capsule Take 2,000 Units by mouth daily.   citalopram (CELEXA) 20 MG tablet Take 1 tablet (20 mg total) by mouth daily.   FOLIC  ACID PO Take by mouth daily.   levothyroxine (SYNTHROID) 75 MCG tablet Take 1 tablet (75 mcg total) by mouth daily before breakfast.   Magnesium 250 MG TABS Take by mouth.   olmesartan (BENICAR) 20 MG tablet Take 0.5 tablets (10 mg total) by mouth in the morning and at bedtime. D/c 5 mg (10 mg bid of 5 mg)   Omega-3 Fatty Acids (FISH OIL) 1000 MG CAPS Take 1,000 mg by mouth daily.    pantoprazole (PROTONIX) 40 MG tablet Take 1 tablet (40 mg total) by mouth daily.   Potassium (POTASSIMIN PO) Take by mouth.   vitamin B-12 (CYANOCOBALAMIN) 1000 MCG tablet Take 2,000 mcg by mouth daily. 2 tabs   cholestyramine (QUESTRAN) 4 g packet Take 1 packet (4 g total) by mouth 2 (two) times daily as needed. (Patient not taking: Reported on 04/21/2023)   ondansetron (ZOFRAN-ODT) 4 MG disintegrating tablet Take 1 tablet (4 mg total) by mouth every 8 (eight) hours as needed for nausea or vomiting. (Patient not taking: Reported on 04/21/2023)   psyllium (METAMUCIL SMOOTH TEXTURE) 58.6 % powder Take 1 packet by mouth 3 (three) times daily. (Patient not taking: Reported on 04/21/2023)   sucralfate (CARAFATE) 1 g tablet Take 1 tablet (1 g total) by mouth every 8 (eight) hours as needed. Dissolve 1 tablet (1 gram) into 10 ml warm water and drink as directed. (Patient not taking: Reported on 04/21/2023)   [DISCONTINUED]  loperamide (IMODIUM A-D) 2 MG tablet Take 1 tablet (2 mg total) by mouth daily. (Patient not taking: Reported on 04/21/2023)   [DISCONTINUED] polyethylene glycol powder (GLYCOLAX/MIRALAX) 17 GM/SCOOP powder Take 17 g by mouth 2 (two) times daily as needed. (Patient not taking: Reported on 04/21/2023)   [DISCONTINUED] senna (SENOKOT) 8.6 MG tablet Take 1-2 tablets (8.6-17.2 mg total) by mouth daily. (Patient not taking: Reported on 04/21/2023)   No facility-administered encounter medications on file as of 04/21/2023.    Past Medical History:  Diagnosis Date   Acid reflux    Allergy    Anemia    Anxiety    in  past   Arthritis    all over   Asthma    Atypical moles    Bronchitis    Bronchospasm    reactive with perfume/cologne and smoke   CAD (coronary artery disease)    patient denies on preop of 10/24    Candida esophagitis (HCC)    Carotid artery calcification, bilateral 04/2016   Carpal tunnel syndrome of left wrist 12/03/2019   Cataract    Cervical stenosis of spine    Chronic insomnia    Complication of anesthesia    shoulder surgery- not all the way asleep   COVID-19 02/04/2021   also 10/3 or 04/20/21 sxs started 04/05/21   Depression    in past   Diverticulitis    Diverticulosis    Dizziness    Elevated liver enzymes    Family history of adverse reaction to anesthesia    sister- has problems with nausea and vomiting    Fatigue    Fatty liver    Generalized headache    migraines   GERD (gastroesophageal reflux disease)    Gross hematuria 08/18/2022   Hereditary and idiopathic peripheral neuropathy 05/11/2015   History of chemotherapy    topical cream for h/o skin CA   History of kidney stones    History of neuropathy    FH Dr. Allena Katz neurology   History of vertigo    Hx of migraines    Hyperlipidemia    Hypothyroidism    Insomnia    Irritable bowel syndrome    LLQ abdominal pain    Melanoma (HCC) 2013   Melanoma (HCC)    x2 stg II melanoma   Neuropathy, peripheral    Peripheral neuropathy    Pneumonia    hx of x 2   PONV (postoperative nausea and vomiting)    Pre-diabetes    Retina disorder    left   SCC (squamous cell carcinoma)    skin    Skin cancer 07/17/2019   SCC left leg s/p mohs   SCC 05/01/18 left medial mid leg, right post mid leg 06/08/18, left mid well 04/10/19  Dr. Emily Filbert derm, Dr. Glennis Brink mohs       Stroke [facility])    ? TIA per Dr V per patient    Thyroid disease    Urine incontinence    Viral URI with cough 06/09/2022   Vitamin D deficiency    Weight gain     Past Surgical History:  Procedure Laterality Date   ABLATION Bilateral     Dr. Wyn Quaker 12/2020 veins laser ablation of both great saphenous veins in a staged fashion.   BILATERAL SALPINGOOPHORECTOMY     ovarian cyst Dr. Dareen Piano and Dr. Allie Dimmer 2008   BREAST SURGERY     in 2014 bx breast calcifications   BREAST SURGERY  augmentation with saline Dr. Karmen Bongo 1995    CHOLECYSTECTOMY N/A 01/28/2015   Procedure: LAPAROSCOPIC CHOLECYSTECTOMY WITH INTRAOPERATIVE CHOLANGIOGRAM;  Surgeon: Chevis Pretty III, MD;  Location: [facility] OR;  Service: General;  Laterality: N/A;   CLOSED MANIPULATION SHOULDER     rt   COLONOSCOPY WITH PROPOFOL N/A 11/20/2012   Procedure: COLONOSCOPY WITH PROPOFOL;  Surgeon: Charolett Bumpers, MD;  Location: [facility];  Service: Endoscopy;  Laterality: N/A;   colonscopy     May 2014   JOINT REPLACEMENT     RTK   KNEE ARTHROSCOPY     left knee miniscus tear repair     MELANOMA EXCISION WITH SENTINEL LYMPH NODE BIOPSY  03/18/2011   Back, nodes neg   OOPHORECTOMY     ROTATOR CUFF REPAIR     left   SHOULDER SURGERY     right and left    SKIN CANCER EXCISION     multiple   TOTAL HIP ARTHROPLASTY     TOTAL KNEE ARTHROPLASTY     right 2010 and left in 2018    TOTAL KNEE ARTHROPLASTY Left 05/16/2017   Procedure: LEFT TOTAL KNEE ARTHROPLASTY;  Surgeon: Durene Romans, MD;  Location: [facility] ORS;  Service: Orthopedics;  Laterality: Left;  70 mins   TUBAL LIGATION     TUMOR REMOVAL     ovary    Family History  Problem Relation Age of Onset   Hypertension Mother    Stroke Mother    Hyperlipidemia Mother    Heart disease Father    Diabetes Father    Hypertension Father    Hyperlipidemia Father    Early death Father    Diabetes Sister    Heart disease Sister        heart murmur   Arthritis Sister    COPD Sister    Hyperlipidemia Sister    Hypertension Sister    Colon polyps Sister        adenomous   Arthritis Sister    Heart disease Sister    Hyperlipidemia Sister    Hypertension Sister    Cancer Sister    Dementia Sister    Tremor  Sister    Stroke Maternal Grandmother    Colon cancer Maternal Grandfather    Cancer Maternal Grandfather    Early death Maternal Grandfather    Early death Paternal Grandmother    Stroke Paternal Grandmother    Heart disease Daughter    Hyperlipidemia Daughter    Hypertension Daughter    Heart disease Son    Hyperlipidemia Son    Alcohol abuse Son    Depression Son    Diabetes Son    Pancreatic cancer Maternal Uncle    Alzheimer's disease Paternal Aunt    Alzheimer's disease Paternal Aunt    Alzheimer's disease Paternal Aunt    Alzheimer's disease Paternal Aunt    Cancer Other        2 paternal uncles and 1 maternal uncle colon cancer   Esophageal cancer Neg Hx    Stomach cancer Neg Hx     Social History   Socioeconomic History   Marital status: Divorced    Spouse name: Not on file   Number of children: 3   Years of education: College   Highest education level: Some college, no degree  Occupational History   Occupation: Retired    Associate Professor: RETIRED  Tobacco Use   Smoking status: Never   Smokeless tobacco: Never  Vaping Use   Vaping status:  Never Used  Substance and Sexual Activity   Alcohol use: Yes    Alcohol/week: 1.0 standard drink of alcohol    Types: 1 Cans of beer per week    Comment: once a week beer or wine   Drug use: No   Sexual activity: Not Currently  Other Topics Concern   Not on file  Social History Narrative   Patient lives at home alone in a one story home.   Has 3 children (2 daughters and 1 son)    Retired from [employer].   Divorced since 1992    Caffeine Use: 1 cup daily and a soft drink occasionally    No guns    Wears seat belt     Lives with cat    Left handed   Social Determinants of Health   Financial Resource Strain: Low Risk  (04/18/2023)   Overall Financial Resource Strain (CARDIA)    Difficulty of Paying Living Expenses: Not hard at all  Food Insecurity: No Food Insecurity (04/18/2023)   Hunger Vital Sign     Worried About Running Out of Food in the Last Year: Never true    Ran Out of Food in the Last Year: Never true  Transportation Needs: No Transportation Needs (04/18/2023)   PRAPARE - Administrator, Civil Service (Medical): No    Lack of Transportation (Non-Medical): No  Physical Activity: Inactive (04/18/2023)   Exercise Vital Sign    Days of Exercise per Week: 0 days    Minutes of Exercise per Session: 0 min  Stress: Stress Concern Present (04/18/2023)   [employer] of Occupational Health - Occupational Stress Questionnaire    Feeling of Stress : To some extent  Social Connections: Moderately Integrated (04/18/2023)   Social Connection and Isolation Panel [NHANES]    Frequency of Communication with Friends and Family: More than three times a week    Frequency of Social Gatherings with Friends and Family: Three times a week    Attends Religious Services: More than 4 times per year    Active Member of Clubs or Organizations: Yes    Attends Banker Meetings: More than 4 times per year    Marital Status: Divorced  Intimate Partner Violence: Not At Risk (04/18/2023)   Humiliation, Afraid, Rape, and Kick questionnaire    Fear of Current or Ex-Partner: No    Emotionally Abused: No    Physically Abused: No    Sexually Abused: No    Review of Systems  Constitutional:  Negative for chills, fever and weight loss.  HENT: Negative.  Negative for sinus pain and sore throat.   Eyes: Negative.   Respiratory:  Positive for cough and shortness of breath. Negative for wheezing.   Cardiovascular: Negative.  Negative for chest pain, palpitations and leg swelling.  Gastrointestinal:  Positive for constipation, heartburn and nausea. Negative for abdominal pain, diarrhea and vomiting.  Genitourinary: Negative.  Negative for dysuria and flank pain.  Musculoskeletal:  Positive for joint pain. Negative for myalgias.  Skin: Negative.   Neurological: Negative.  Negative for  dizziness and headaches.  Endo/Heme/Allergies: Negative.   Psychiatric/Behavioral: Negative.  Negative for depression and suicidal ideas. The patient is not nervous/anxious.         Objective    BP 110/68   Pulse 74   Ht 5\' 6"  (1.676 m)   Wt 186 lb 9.6 oz (84.6 kg)   SpO2 97%   BMI 30.12 kg/m   Physical Exam  Vitals and nursing note reviewed.  Constitutional:      Appearance: Normal appearance.  HENT:     Head: Normocephalic and atraumatic.     Nose: Nose normal.     Mouth/Throat:     Mouth: Mucous membranes are moist.     Pharynx: Oropharynx is clear.  Eyes:     Conjunctiva/sclera: Conjunctivae normal.     Pupils: Pupils are equal, round, and reactive to light.  Cardiovascular:     Rate and Rhythm: Normal rate and regular rhythm.     Pulses: Normal pulses.     Heart sounds: Normal heart sounds. No murmur heard. Pulmonary:     Effort: Pulmonary effort is normal.     Breath sounds: Normal breath sounds. No wheezing.  Abdominal:     General: Bowel sounds are normal.     Palpations: Abdomen is soft.     Tenderness: There is no abdominal tenderness. There is no right CVA tenderness or left CVA tenderness.  Musculoskeletal:        General: Normal range of motion.     Cervical back: Normal range of motion.     Right lower leg: No edema.     Left lower leg: No edema.  Skin:    General: Skin is warm and dry.  Neurological:     General: No focal deficit present.     Mental Status: She is alert and oriented to person, place, and time.  Psychiatric:        Mood and Affect: Mood normal.        Behavior: Behavior normal.        Assessment & Plan:  To continue all medications. Check labs. Will schedule DEXA at next follow-up. Problem List Items Addressed This Visit     Hypothyroidism   Relevant Orders   TSH+T4F+T3Free   Hyperlipidemia   Relevant Orders   Lipid Panel w/o Chol/HDL Ratio   Vitamin D deficiency   Relevant Orders   Vitamin D (25 hydroxy)    Other Visit Diagnoses     Essential hypertension, benign    -  Primary   Relevant Orders   CMP14+EGFR   Hypocalcemia       Relevant Orders   Magnesium   Folate deficiency       Relevant Orders   RBC Folate   Vitamin B12 deficiency       Relevant Orders   CBC with Diff   Vitamin B12   Screening for blood or protein in urine       Relevant Orders   POCT Urinalysis Dipstick (81002) (Completed)       Follow up 6 weeks.   Total time spent: 45 minutes  Margaretann Loveless, MD  04/21/2023   This document may have been prepared by [facility] Voice Recognition software and as such may include unintentional dictation errors.

## 2023-04-24 ENCOUNTER — Other Ambulatory Visit: Payer: PPO

## 2023-04-24 DIAGNOSIS — E538 Deficiency of other specified B group vitamins: Secondary | ICD-10-CM | POA: Diagnosis not present

## 2023-04-24 DIAGNOSIS — E038 Other specified hypothyroidism: Secondary | ICD-10-CM | POA: Diagnosis not present

## 2023-04-24 DIAGNOSIS — E559 Vitamin D deficiency, unspecified: Secondary | ICD-10-CM | POA: Diagnosis not present

## 2023-04-24 DIAGNOSIS — I10 Essential (primary) hypertension: Secondary | ICD-10-CM | POA: Diagnosis not present

## 2023-04-24 DIAGNOSIS — E782 Mixed hyperlipidemia: Secondary | ICD-10-CM | POA: Diagnosis not present

## 2023-04-25 LAB — CBC WITH DIFFERENTIAL/PLATELET
Basophils Absolute: 0 10*3/uL (ref 0.0–0.2)
Basos: 1 %
EOS (ABSOLUTE): 0.2 10*3/uL (ref 0.0–0.4)
Eos: 3 %
Hematocrit: 40.2 % (ref 34.0–46.6)
Hemoglobin: 13 g/dL (ref 11.1–15.9)
Immature Grans (Abs): 0 10*3/uL (ref 0.0–0.1)
Immature Granulocytes: 0 %
Lymphocytes Absolute: 1.8 10*3/uL (ref 0.7–3.1)
Lymphs: 32 %
MCH: 30.2 pg (ref 26.6–33.0)
MCHC: 32.3 g/dL (ref 31.5–35.7)
MCV: 93 fL (ref 79–97)
Monocytes Absolute: 0.5 10*3/uL (ref 0.1–0.9)
Monocytes: 8 %
Neutrophils Absolute: 3.1 10*3/uL (ref 1.4–7.0)
Neutrophils: 56 %
Platelets: 197 10*3/uL (ref 150–450)
RBC: 4.31 x10E6/uL (ref 3.77–5.28)
RDW: 12.9 % (ref 11.7–15.4)
WBC: 5.6 10*3/uL (ref 3.4–10.8)

## 2023-04-25 LAB — TSH+T4F+T3FREE
Free T4: 1.7 ng/dL (ref 0.82–1.77)
T3, Free: 2.5 pg/mL (ref 2.0–4.4)
TSH: 1.42 u[IU]/mL (ref 0.450–4.500)

## 2023-04-25 LAB — CMP14+EGFR
ALT: 12 [IU]/L (ref 0–32)
AST: 16 [IU]/L (ref 0–40)
Albumin: 4.1 g/dL (ref 3.7–4.7)
Alkaline Phosphatase: 69 [IU]/L (ref 44–121)
BUN/Creatinine Ratio: 15 (ref 12–28)
BUN: 17 mg/dL (ref 8–27)
Bilirubin Total: 1.6 mg/dL — ABNORMAL HIGH (ref 0.0–1.2)
CO2: 21 mmol/L (ref 20–29)
Calcium: 9.4 mg/dL (ref 8.7–10.3)
Chloride: 108 mmol/L — ABNORMAL HIGH (ref 96–106)
Creatinine, Ser: 1.11 mg/dL — ABNORMAL HIGH (ref 0.57–1.00)
Globulin, Total: 1.9 g/dL (ref 1.5–4.5)
Glucose: 108 mg/dL — ABNORMAL HIGH (ref 70–99)
Potassium: 4.5 mmol/L (ref 3.5–5.2)
Sodium: 145 mmol/L — ABNORMAL HIGH (ref 134–144)
Total Protein: 6 g/dL (ref 6.0–8.5)
eGFR: 50 mL/min/{1.73_m2} — ABNORMAL LOW (ref >59)

## 2023-04-25 LAB — VITAMIN B12: Vitamin B-12: 1269 pg/mL — ABNORMAL HIGH (ref 232–1245)

## 2023-04-25 LAB — LIPID PANEL W/O CHOL/HDL RATIO
Cholesterol, Total: 119 mg/dL (ref 100–199)
HDL: 65 mg/dL (ref >39)
LDL Chol Calc (NIH): 39 mg/dL (ref 0–99)
Triglycerides: 77 mg/dL (ref 0–149)
VLDL Cholesterol Cal: 15 mg/dL (ref 5–40)

## 2023-04-25 LAB — FOLATE RBC
Folate, Hemolysate: 515 ng/mL
Folate, RBC: 1281 ng/mL (ref >498)

## 2023-04-25 LAB — MAGNESIUM: Magnesium: 2 mg/dL (ref 1.6–2.3)

## 2023-04-25 LAB — VITAMIN D 25 HYDROXY (VIT D DEFICIENCY, FRACTURES): Vit D, 25-Hydroxy: 61.4 ng/mL (ref 30.0–100.0)

## 2023-04-26 ENCOUNTER — Encounter: Payer: Self-pay | Admitting: Gastroenterology

## 2023-04-26 ENCOUNTER — Ambulatory Visit: Payer: PPO | Admitting: Gastroenterology

## 2023-04-26 VITALS — BP 110/60 | HR 53 | Ht 66.0 in | Wt 188.0 lb

## 2023-04-26 DIAGNOSIS — K920 Hematemesis: Secondary | ICD-10-CM

## 2023-04-26 DIAGNOSIS — K219 Gastro-esophageal reflux disease without esophagitis: Secondary | ICD-10-CM

## 2023-04-26 DIAGNOSIS — K529 Noninfective gastroenteritis and colitis, unspecified: Secondary | ICD-10-CM | POA: Diagnosis not present

## 2023-04-26 MED ORDER — PANTOPRAZOLE SODIUM 40 MG PO TBEC: 40.0000 mg | DELAYED_RELEASE_TABLET | Freq: Two times a day (BID) | ORAL | 5 refills | Status: DC

## 2023-04-26 NOTE — Patient Instructions (Addendum)
Take protonix 40mg  twice daily  Take imodium once daily or every other day for diarrhea  Please go to pelvic floor PT  Squatty potty during bowel movements.  You have been scheduled for an endoscopy. Please follow written instructions given to you at your visit today.  If you use inhalers (even only as needed), please bring them with you on the day of your procedure.  If you take any of the following medications, they will need to be adjusted prior to your procedure:   DO NOT TAKE 7 DAYS PRIOR TO TEST- Trulicity (dulaglutide) Ozempic, Wegovy (semaglutide) Mounjaro (tirzepatide) Bydureon Bcise (exanatide extended release)  DO NOT TAKE 1 DAY PRIOR TO YOUR TEST Rybelsus (semaglutide) Adlyxin (lixisenatide) Victoza (liraglutide) Byetta (exanatide) ___________________________________________________________________________    Thank you for trusting me with your gastrointestinal care!   Boone Master, PA   If your blood pressure at your visit was 140/90 or greater, please contact your primary care physician to follow up on this. ______________________________________________________  If you are age 79 or older, your body mass index should be between 23-30. Your Body mass index is 30.34 kg/m. If this is out of the aforementioned range listed, please consider follow up with your Primary Care Provider.  If you are age 95 or younger, your body mass index should be between 19-25. Your Body mass index is 30.34 kg/m. If this is out of the aformentioned range listed, please consider follow up with your Primary Care Provider.  ________________________________________________________  The [facility] providers would like to encourage you to use [facility] to communicate with providers for non-urgent requests or questions.  Due to long hold times on the telephone, sending your provider a message by [facility] may be a faster and more efficient way to get a response.  Please allow 48 business  hours for a response.  Please remember that this is for non-urgent requests.  _______________________________________________________  Due to recent changes in healthcare laws, you may see the results of your imaging and laboratory studies on MyChart before your provider has had a chance to review them.  We understand that in some cases there may be results that are confusing or concerning to you. Not all laboratory results come back in the same time frame and the provider may be waiting for multiple results in order to interpret others.  Please give [name] 48 hours in order for your provider to thoroughly review all the results before contacting the office for clarification of your results.

## 2023-04-26 NOTE — Progress Notes (Signed)
Chief Complaint: Primary GI MD: Dr. Paulo Fruit  HPI: 82 year old female s/p cholecystectomy and others as listed below presents for follow-up.  History of colonoscopy in 2014 by [facility] with having to be admitted to the hospital for pain after procedure and concern for perforation.  She was told she should never have a colonoscopy again.  She did have a virtual colonoscopy in August 2021 with no obvious polyps or mass lesions, she does have suboptimal distention of sigmoid colon with diverticulosis.    Last seen June 2024 by Dr. Adela Lank.  At that time she was reporting chronic diarrhea.  Had been tested for celiac which was negative.  Felt colestipol 1G twice daily made a difference in her symptoms but did not control them entirely.  Dr. Adela Lank added on Imodium once daily and referred her to pelvic floor physical therapy for incontinence.  Recent lab work done by PCP CBC, TSH, lipid panel unrevealing CMP with bilirubin 1.6 and CKD noted  Patient states she continues to have loose stools. Reports 3-4 thin stools per day. Some improvement on colestipol. Notes when she took the imodium she had some vomiting so she felt it was due to the imodium and she stopped taking it. She did note the same day she ate pizza and had intense GERD and vomiting.  She reports she did not go to pelvic floor PT. She has urinary incontinence and frequency. Notes when she urinates she will have a bowel movement as well. Has had 3 children in the past.  She notes increasing GERD and one episode of hematemesis after intense retching and multiple episodes of vomiting. She was put on carafate twice daily with relief of vomiting, though GERD continues despite 40mg  once daily.   PREVIOUS GI WORKUP   EGD 2018 for hematemesis - Esophagogastric landmarks identified.  - 3 cm hiatal hernia.  - Multiple plaques in the proximal esophagus and in the mid esophagus, suspicious for esophageal candidiasis. Brushings  performed.  - No evidence of Barrett' s or esophagitis  - Normal stomach.  - Normal duodenal bulb and second portion of the duodenum.  CT scan 07/26/2017 IMPRESSION: 1. No acute process in the abdomen or pelvis. No evidence of metastatic disease in the abdomen or pelvis. 2.  Aortic Atherosclerosis (ICD10-I70.0). 3. Pelvic floor laxity. 4. Hiatal hernia.     Virtual colonoscopy 02/25/2020 - IMPRESSION: 1. Suboptimal evaluation of the sigmoid, secondary to underdistention. No circumferential mass within this region. More proximally, no evidence of clinically significant colonic polyp or mass. 2. Small hiatal hernia. 3. Aortic Atherosclerosis (ICD10-I70.0).     CT renal stone study 08/18/22: IMPRESSION: Extensive colonic diverticulosis without evidence of diverticulitis.  Moderate-sized hiatal hernia. Umbilical hernia containing fat. No acute intra-abdominal or intrapelvic abnormalities. Aortic Atherosclerosis (ICD10-I70.0).     CT abdomen / pelvis 11/27/22: IMPRESSION: 1. No acute localizing process in the abdomen or pelvis. 2. Colonic diverticulosis without evidence for diverticulitis. 3. Moderate-sized hiatal hernia.        Past Medical History:  Diagnosis Date   Acid reflux    Allergy    Anemia    Anxiety    in past   Arthritis    all over   Asthma    Atypical moles    Bronchitis    Bronchospasm    reactive with perfume/cologne and smoke   CAD (coronary artery disease)    patient denies on preop of 10/24    Candida esophagitis (HCC)    Carotid artery calcification,  bilateral 04/2016   Carpal tunnel syndrome of left wrist 12/03/2019   Cataract    Cervical stenosis of spine    Chronic insomnia    Complication of anesthesia    shoulder surgery- not all the way asleep   COVID-19 02/04/2021   also 10/3 or 04/20/21 sxs started 04/05/21   Depression    in past   Diverticulitis    Diverticulosis    Dizziness    Elevated liver enzymes    Family history of  adverse reaction to anesthesia    sister- has problems with nausea and vomiting    Fatigue    Fatty liver    Generalized headache    migraines   GERD (gastroesophageal reflux disease)    Gross hematuria 08/18/2022   Hereditary and idiopathic peripheral neuropathy 05/11/2015   History of chemotherapy    topical cream for h/o skin CA   History of kidney stones    History of neuropathy    FH Dr. Allena Katz neurology   History of vertigo    Hx of migraines    Hyperlipidemia    Hypothyroidism    Insomnia    Irritable bowel syndrome    LLQ abdominal pain    Melanoma (HCC) 2013   Melanoma (HCC)    x2 stg II melanoma   Neuropathy, peripheral    Peripheral neuropathy    Pneumonia    hx of x 2   PONV (postoperative nausea and vomiting)    Pre-diabetes    Retina disorder    left   SCC (squamous cell carcinoma)    skin    Skin cancer 07/17/2019   SCC left leg s/p mohs   SCC 05/01/18 left medial mid leg, right post mid leg 06/08/18, left mid well 04/10/19  Dr. Emily Filbert derm, Dr. Glennis Brink mohs       Stroke [facility])    ? TIA per Dr V per patient    Thyroid disease    Urine incontinence    Viral URI with cough 06/09/2022   Vitamin D deficiency    Weight gain     Past Surgical History:  Procedure Laterality Date   ABLATION Bilateral    Dr. Wyn Quaker 12/2020 veins laser ablation of both great saphenous veins in a staged fashion.   BILATERAL SALPINGOOPHORECTOMY     ovarian cyst Dr. Dareen Piano and Dr. Allie Dimmer 2008   BREAST SURGERY     in 2014 bx breast calcifications   BREAST SURGERY     augmentation with saline Dr. Karmen Bongo 1995    CHOLECYSTECTOMY N/A 01/28/2015   Procedure: LAPAROSCOPIC CHOLECYSTECTOMY WITH INTRAOPERATIVE CHOLANGIOGRAM;  Surgeon: Chevis Pretty III, MD;  Location: [facility] OR;  Service: General;  Laterality: N/A;   CLOSED MANIPULATION SHOULDER     rt   COLONOSCOPY WITH PROPOFOL N/A 11/20/2012   Procedure: COLONOSCOPY WITH PROPOFOL;  Surgeon: Charolett Bumpers, MD;  Location: [facility]  ENDOSCOPY;  Service: Endoscopy;  Laterality: N/A;   colonscopy     May 2014   JOINT REPLACEMENT     RTK   KNEE ARTHROSCOPY     left knee miniscus tear repair     MELANOMA EXCISION WITH SENTINEL LYMPH NODE BIOPSY  03/18/2011   Back, nodes neg   OOPHORECTOMY     ROTATOR CUFF REPAIR     left   SHOULDER SURGERY     right and left    SKIN CANCER EXCISION     multiple   TOTAL HIP ARTHROPLASTY  TOTAL KNEE ARTHROPLASTY     right 2010 and left in 2018    TOTAL KNEE ARTHROPLASTY Left 05/16/2017   Procedure: LEFT TOTAL KNEE ARTHROPLASTY;  Surgeon: Durene Romans, MD;  Location: [facility] ORS;  Service: Orthopedics;  Laterality: Left;  70 mins   TUBAL LIGATION     TUMOR REMOVAL     ovary    Current Outpatient Medications  Medication Sig Dispense Refill   albuterol (PROAIR HFA) 108 (90 Base) MCG/ACT inhaler Inhale 1-2 puffs into the lungs every 6 (six) hours as needed for wheezing or shortness of breath. 18 g 11   aspirin 81 MG tablet TAKE 1 TABLET BY MOUTH EVERY DAY     atorvastatin (LIPITOR) 20 MG tablet Take 1 tablet (20 mg total) by mouth daily. 90 tablet 3   Biotin 1000 MCG tablet Take 1,000 mcg by mouth daily.     BREO ELLIPTA 100-25 MCG/ACT AEPB Inhale 1 puff into the lungs daily. 28 each 11   Calcium 600-200 MG-UNIT tablet Take 1 tablet daily by mouth.     Cholecalciferol (VITAMIN D3) 2000 UNITS capsule Take 2,000 Units by mouth daily.     cholestyramine (QUESTRAN) 4 g packet Take 1 packet (4 g total) by mouth 2 (two) times daily as needed. (Patient not taking: Reported on 04/21/2023) 60 each 1   citalopram (CELEXA) 20 MG tablet Take 1 tablet (20 mg total) by mouth daily. 90 tablet 3   FOLIC ACID PO Take by mouth daily.     levothyroxine (SYNTHROID) 75 MCG tablet Take 1 tablet (75 mcg total) by mouth daily before breakfast. 90 tablet 3   Magnesium 250 MG TABS Take by mouth.     olmesartan (BENICAR) 20 MG tablet Take 0.5 tablets (10 mg total) by mouth in the morning and at bedtime. D/c 5  mg (10 mg bid of 5 mg) 90 tablet 3   Omega-3 Fatty Acids (FISH OIL) 1000 MG CAPS Take 1,000 mg by mouth daily.      ondansetron (ZOFRAN-ODT) 4 MG disintegrating tablet Take 1 tablet (4 mg total) by mouth every 8 (eight) hours as needed for nausea or vomiting. (Patient not taking: Reported on 04/21/2023) 12 tablet 0   pantoprazole (PROTONIX) 40 MG tablet Take 1 tablet (40 mg total) by mouth daily. 30 tablet 1   Potassium (POTASSIMIN PO) Take by mouth.     psyllium (METAMUCIL SMOOTH TEXTURE) 58.6 % powder Take 1 packet by mouth 3 (three) times daily. (Patient not taking: Reported on 04/21/2023) 283 g 12   sucralfate (CARAFATE) 1 g tablet Take 1 tablet (1 g total) by mouth every 8 (eight) hours as needed. Dissolve 1 tablet (1 gram) into 10 ml warm water and drink as directed. (Patient not taking: Reported on 04/21/2023) 90 tablet 1   vitamin B-12 (CYANOCOBALAMIN) 1000 MCG tablet Take 2,000 mcg by mouth daily. 2 tabs     No current facility-administered medications for this visit.    Allergies as of 04/26/2023 - Review Complete 04/21/2023  Allergen Reaction Noted   Epinephrine Other (See Comments) and Anaphylaxis 02/28/2011   Penicillins Swelling and Other (See Comments) 02/28/2011   Other  05/10/2017   Valium [diazepam] Other (See Comments) 07/01/2014    Family History  Problem Relation Age of Onset   Hypertension Mother    Stroke Mother    Hyperlipidemia Mother    Heart disease Father    Diabetes Father    Hypertension Father    Hyperlipidemia Father  Early death Father    Diabetes Sister    Heart disease Sister        heart murmur   Arthritis Sister    COPD Sister    Hyperlipidemia Sister    Hypertension Sister    Colon polyps Sister        adenomous   Arthritis Sister    Heart disease Sister    Hyperlipidemia Sister    Hypertension Sister    Cancer Sister    Dementia Sister    Tremor Sister    Stroke Maternal Grandmother    Colon cancer Maternal Grandfather    Cancer  Maternal Grandfather    Early death Maternal Grandfather    Early death Paternal Grandmother    Stroke Paternal Grandmother    Heart disease Daughter    Hyperlipidemia Daughter    Hypertension Daughter    Heart disease Son    Hyperlipidemia Son    Alcohol abuse Son    Depression Son    Diabetes Son    Pancreatic cancer Maternal Uncle    Alzheimer's disease Paternal Aunt    Alzheimer's disease Paternal Aunt    Alzheimer's disease Paternal Aunt    Alzheimer's disease Paternal Aunt    Cancer Other        2 paternal uncles and 1 maternal uncle colon cancer   Esophageal cancer Neg Hx    Stomach cancer Neg Hx     Social History   Socioeconomic History   Marital status: Divorced    Spouse name: Not on file   Number of children: 3   Years of education: College   Highest education level: Some college, no degree  Occupational History   Occupation: Retired    Associate Professor: RETIRED  Tobacco Use   Smoking status: Never   Smokeless tobacco: Never  Vaping Use   Vaping status: Never Used  Substance and Sexual Activity   Alcohol use: Yes    Alcohol/week: 1.0 standard drink of alcohol    Types: 1 Cans of beer per week    Comment: once a week beer or wine   Drug use: No   Sexual activity: Not Currently  Other Topics Concern   Not on file  Social History Narrative   Patient lives at home alone in a one story home.   Has 3 children (2 daughters and 1 son)    Retired from [employer].   Divorced since 1992    Caffeine Use: 1 cup daily and a soft drink occasionally    No guns    Wears seat belt     Lives with cat    Left handed   Social Determinants of Health   Financial Resource Strain: Low Risk  (04/18/2023)   Overall Financial Resource Strain (CARDIA)    Difficulty of Paying Living Expenses: Not hard at all  Food Insecurity: No Food Insecurity (04/18/2023)   Hunger Vital Sign    Worried About Running Out of Food in the Last Year: Never true    Ran Out of Food in  the Last Year: Never true  Transportation Needs: No Transportation Needs (04/18/2023)   PRAPARE - Administrator, Civil Service (Medical): No    Lack of Transportation (Non-Medical): No  Physical Activity: Inactive (04/18/2023)   Exercise Vital Sign    Days of Exercise per Week: 0 days    Minutes of Exercise per Session: 0 min  Stress: Stress Concern Present (04/18/2023)   [employer] of Occupational  Health - Occupational Stress Questionnaire    Feeling of Stress : To some extent  Social Connections: Moderately Integrated (04/18/2023)   Social Connection and Isolation Panel [NHANES]    Frequency of Communication with Friends and Family: More than three times a week    Frequency of Social Gatherings with Friends and Family: Three times a week    Attends Religious Services: More than 4 times per year    Active Member of Clubs or Organizations: Yes    Attends Banker Meetings: More than 4 times per year    Marital Status: Divorced  Intimate Partner Violence: Not At Risk (04/18/2023)   Humiliation, Afraid, Rape, and Kick questionnaire    Fear of Current or Ex-Partner: No    Emotionally Abused: No    Physically Abused: No    Sexually Abused: No    Review of Systems:    Constitutional: No weight loss, fever, chills, weakness or fatigue HEENT: Eyes: No change in vision               Ears, Nose, Throat:  No change in hearing or congestion Skin: No rash or itching Cardiovascular: No chest pain, chest pressure or palpitations   Respiratory: No SOB or cough Gastrointestinal: See HPI and otherwise negative Genitourinary: No dysuria or change in urinary frequency Neurological: No headache, dizziness or syncope Musculoskeletal: No new muscle or joint pain Hematologic: No bleeding or bruising Psychiatric: No history of depression or anxiety    Physical Exam:  Vital signs: There were no vitals taken for this visit.  Constitutional: NAD, Well developed, Well  nourished, alert and cooperative. Appears younger than stated age Head:  Normocephalic and atraumatic. Eyes:   PEERL, EOMI. No icterus. Conjunctiva pink. Respiratory: Respirations even and unlabored. Lungs clear to auscultation bilaterally.   No wheezes, crackles, or rhonchi.  Cardiovascular:  Regular rate and rhythm. No peripheral edema, cyanosis or pallor.  Gastrointestinal:  Soft, nondistended, nontender. No rebound or guarding. Normal bowel sounds. No appreciable masses or hepatomegaly. Rectal:  Not performed.  Msk:  Symmetrical without gross deformities. Without edema, no deformity or joint abnormality.  Neurologic:  Alert and  oriented x4;  grossly normal neurologically.  Skin:   Dry and intact without significant lesions or rashes. Psychiatric: Oriented to person, place and time. Demonstrates good judgement and reason without abnormal affect or behaviors.   RELEVANT LABS AND IMAGING: CBC    Component Value Date/Time   WBC 5.6 04/24/2023 0837   WBC 6.2 03/21/2023 1513   RBC 4.31 04/24/2023 0837   RBC 4.40 03/21/2023 1513   HGB 13.0 04/24/2023 0837   HGB 14.9 04/07/2016 1157   HCT 40.2 04/24/2023 0837   HCT 45.7 04/07/2016 1157   PLT 197 04/24/2023 0837   MCV 93 04/24/2023 0837   MCV 91.1 04/07/2016 1157   MCH 30.2 04/24/2023 0837   MCH 29.8 03/21/2023 1513   MCHC 32.3 04/24/2023 0837   MCHC 32.2 03/21/2023 1513   RDW 12.9 04/24/2023 0837   RDW 13.9 04/07/2016 1157   LYMPHSABS 1.8 04/24/2023 0837   LYMPHSABS 1.8 04/07/2016 1157   MONOABS 0.6 08/22/2022 1442   MONOABS 0.6 04/07/2016 1157   EOSABS 0.2 04/24/2023 0837   BASOSABS 0.0 04/24/2023 0837   BASOSABS 0.0 04/07/2016 1157    CMP     Component Value Date/Time   NA 145 (H) 04/24/2023 0837   NA 143 04/07/2016 1158   K 4.5 04/24/2023 0837   K 4.0 04/07/2016 1158  CL 108 (H) 04/24/2023 0837   CL 113 (H) 05/22/2012 1004   CO2 21 04/24/2023 0837   CO2 21 (L) 04/07/2016 1158   GLUCOSE 108 (H) 04/24/2023 0837    GLUCOSE 98 03/21/2023 1513   GLUCOSE 103 04/07/2016 1158   GLUCOSE 111 (H) 05/22/2012 1004   BUN 17 04/24/2023 0837   BUN 23.0 04/07/2016 1158   CREATININE 1.11 (H) 04/24/2023 0837   CREATININE 1.10 (H) 04/14/2020 0826   CREATININE 0.9 04/07/2016 1158   CALCIUM 9.4 04/24/2023 0837   CALCIUM 9.3 04/07/2016 1158   PROT 6.0 04/24/2023 0837   PROT 7.1 04/07/2016 1158   ALBUMIN 4.1 04/24/2023 0837   ALBUMIN 3.9 04/07/2016 1158   AST 16 04/24/2023 0837   AST 12 04/07/2016 1158   ALT 12 04/24/2023 0837   ALT 16 04/07/2016 1158   ALKPHOS 69 04/24/2023 0837   ALKPHOS 79 04/07/2016 1158   BILITOT 1.6 (H) 04/24/2023 0837   BILITOT 1.40 (H) 04/07/2016 1158   GFRNONAA 58 (L) 03/21/2023 1513   GFRAA >60 05/31/2019 1251     Assessment/Plan:   82 year old female s/p cholecystectomy presenting with chronic diarrhea on colestipol 1G twice daily with 3-4 thin/loose stools per day with associated urinary symptoms and has not gone to pelvic floor PT. She also reports GERD and one episode of large volume hematemesis despite PPI 40mg  every day, vomiting has resolved on carafate twice daily. EGD in 2018 for hematemesis was unrevealing. On daily ASA, no other NSAIDS  Hematemesis, unspecified whether nausea present Gastroesophageal reflux disease, unspecified whether esophagitis present Suspect episode of hematemesis was from esophagitis versus mallory-weiss tear secondary to intense retching/vomiting. No further episodes of vomiting since being on carafate. Continued GERD. -- increase PPI to 40mg  BID for 60 days (then back down to every day to minimize osteoporosis risk) -- EGD for further evaluation ([facility] candidate) -- I thoroughly discussed the procedure with the patient (at bedside) to include nature of the procedure, alternatives, benefits, and risks (including but not limited to bleeding, infection, perforation, anesthesia/cardiac pulmonary complications).  Patient verbalized understanding and gave  verbal consent to proceed with procedure.  --- educated patient on lifestyle modifications --- continue carafate BID --- follow up per EGD  Chronic diarrhea 3-4 loose/thin stools per day. 2014 colonoscopy with perf. 2021 virtual colonoscopy unrevealing. S/p cholecystectomy. Urinary frequency and incontinence. --- stressed importance of proceeding with pelvic floor PT --- continue colestipol --- retry imodium once daily or every other day --- squatty potty    Lara Mulch [facility] 04/26/2023, 8:30 AM  Cc: Dana Allan, MD

## 2023-04-26 NOTE — Progress Notes (Signed)
Agree with assessment and plan as outlined.  I discussed the patient's case with Bayley and also saw and interviewed the patient myself.  Given her recent episode of hematemesis and reflux we will schedule for EGD at the [facility] and increase her PPI to twice daily until the procedure is done.  Continue Carafate as needed.  She needs to follow-up with pelvic floor PT for her loose stools, hopefully that will provide some benefit.  Add Imodium to colestipol.  Harlin Rain, MD [facility]

## 2023-05-13 ENCOUNTER — Encounter: Payer: Self-pay | Admitting: Certified Registered Nurse Anesthetist

## 2023-05-16 ENCOUNTER — Ambulatory Visit: Payer: PPO | Admitting: Internal Medicine

## 2023-05-16 ENCOUNTER — Encounter: Payer: Self-pay | Admitting: Internal Medicine

## 2023-05-16 VITALS — BP 188/102 | HR 65 | Temp 97.7°F | Resp 9 | Ht 66.0 in | Wt 188.0 lb

## 2023-05-16 DIAGNOSIS — K2970 Gastritis, unspecified, without bleeding: Secondary | ICD-10-CM

## 2023-05-16 DIAGNOSIS — K920 Hematemesis: Secondary | ICD-10-CM

## 2023-05-16 DIAGNOSIS — K2101 Gastro-esophageal reflux disease with esophagitis, with bleeding: Secondary | ICD-10-CM | POA: Diagnosis not present

## 2023-05-16 DIAGNOSIS — K2100 Gastro-esophageal reflux disease with esophagitis, without bleeding: Secondary | ICD-10-CM

## 2023-05-16 DIAGNOSIS — K219 Gastro-esophageal reflux disease without esophagitis: Secondary | ICD-10-CM

## 2023-05-16 DIAGNOSIS — K2951 Unspecified chronic gastritis with bleeding: Secondary | ICD-10-CM | POA: Diagnosis not present

## 2023-05-16 DIAGNOSIS — K229 Disease of esophagus, unspecified: Secondary | ICD-10-CM | POA: Diagnosis not present

## 2023-05-16 MED ORDER — SODIUM CHLORIDE 0.9 % IV SOLN: 500.0000 mL | INTRAVENOUS | Status: DC

## 2023-05-16 NOTE — Progress Notes (Signed)
History and Physical Interval Note:  05/16/2023 9:50 AM  [name]  has presented today for endoscopic procedure(s), with the diagnosis of  Encounter Diagnoses  Name Primary?   Hematemesis, unspecified whether nausea present Yes   Gastroesophageal reflux disease, unspecified whether esophagitis present   .  The various methods of evaluation and treatment have been discussed with the patient and/or family. After consideration of risks, benefits and other options for treatment, the patient has consented to  the endoscopic procedure(s).   The patient's history has been reviewed, patient examined, no change in status, stable for endoscopic procedure(s).  I have reviewed the patient's chart and labs.  Questions were answered to the patient's satisfaction.     Iva Boop, MD, [facility]

## 2023-05-16 NOTE — Progress Notes (Signed)
Sedate, gd SR, tolerated procedure well, VSS, report to RN 

## 2023-05-16 NOTE — Patient Instructions (Addendum)
There were signs of reflux esophagitis - biopsies taken. Also saw gastritis. Hiatal hernia again.  I will get pathology results and let Dr. Adela Lank know and he will make other recommendations. Stay on the twice a day pantoprazole. Use thecarafate before meals also. And follow a reflux prevention diet plan.  I appreciate the opportunity to care for you. Iva Boop, MD, FACG   YOU HAD AN ENDOSCOPIC PROCEDURE TODAY AT THE [facility]:   Refer to the procedure report that was given to you for any specific questions about what was found during the examination.  If the procedure report does not answer your questions, please call your gastroenterologist to clarify.  If you requested that your care partner not be given the details of your procedure findings, then the procedure report has been included in a sealed envelope for you to review at your convenience later.  YOU SHOULD EXPECT: Some feelings of bloating in the abdomen. Passage of more gas than usual.  Walking can help get rid of the air that was put into your GI tract during the procedure and reduce the bloating. If you had a lower endoscopy (such as a colonoscopy or flexible sigmoidoscopy) you may notice spotting of blood in your stool or on the toilet paper. If you underwent a bowel prep for your procedure, you may not have a normal bowel movement for a few days.  Please Note:  You might notice some irritation and congestion in your nose or some drainage.  This is from the oxygen used during your procedure.  There is no need for concern and it should clear up in a day or so.  SYMPTOMS TO REPORT IMMEDIATELY:  Following upper endoscopy (EGD)  Vomiting of blood or coffee ground material  New chest pain or pain under the shoulder blades  Painful or persistently difficult swallowing  New shortness of breath  Fever of 100F or higher  Black, tarry-looking stools  For urgent or emergent issues, a gastroenterologist can be  reached at any hour by calling [phone]. Do not use MyChart messaging for urgent concerns.    DIET:  We do recommend a small meal at first, but then you may proceed to your regular diet.  Drink plenty of fluids but you should avoid alcoholic beverages for 24 hours.  ACTIVITY:  You should plan to take it easy for the rest of today and you should NOT DRIVE or use heavy machinery until tomorrow (because of the sedation medicines used during the test).    FOLLOW UP: Our staff will call the number listed on your records the next business day following your procedure.  We will call around 7:15- 8:00 am to check on you and address any questions or concerns that you may have regarding the information given to you following your procedure. If we do not reach you, we will leave a message.     If any biopsies were taken you will be contacted by phone or by letter within the next 1-3 weeks.  Please call us at [phone] if you have not heard about the biopsies in 3 weeks.    SIGNATURES/CONFIDENTIALITY: You and/or your care partner have signed paperwork which will be entered into your electronic medical record.  These signatures attest to the fact that that the information above on your After Visit Summary has been reviewed and is understood.  Full responsibility of the confidentiality of this discharge information lies with you and/or your care-partner.

## 2023-05-16 NOTE — Progress Notes (Signed)
Called to room to assist during endoscopic procedure.  Patient ID and intended procedure confirmed with present staff. Received instructions for my participation in the procedure from the performing physician.  

## 2023-05-16 NOTE — Op Note (Signed)
[facility] Patient Name: [name] Procedure Date: 05/16/2023 10:01 AM MRN: [id number] Endoscopist: Iva Boop , MD, [id number] Age: 82 Referring MD:  Date of Birth: 05/18/1941 Gender: Female Account #: [id number] Procedure:                Upper GI endoscopy Indications:              Heartburn, Gastro-esophageal reflux disease,                            Follow-up of gastro-esophageal reflux disease,                            Hematemesis Medicines:                Monitored Anesthesia Care Procedure:                Pre-Anesthesia Assessment:                           - Prior to the procedure, a History and Physical                            was performed, and patient medications and                            allergies were reviewed. The patient's tolerance of                            previous anesthesia was also reviewed. The risks                            and benefits of the procedure and the sedation                            options and risks were discussed with the patient.                            All questions were answered, and informed consent                            was obtained. Prior Anticoagulants: The patient has                            taken no anticoagulant or antiplatelet agents. ASA                            Grade Assessment: II - A patient with mild systemic                            disease. After reviewing the risks and benefits,                            the patient was deemed in satisfactory condition to  undergo the procedure.                           After obtaining informed consent, the endoscope was                            passed under direct vision. Throughout the                            procedure, the patient's blood pressure, pulse, and                            oxygen saturations were monitored continuously. The                            Olympus scope [id number] was introduced through  the                            mouth, and advanced to the second part of duodenum.                            The upper GI endoscopy was accomplished without                            difficulty. The patient tolerated the procedure                            fairly well. Scope In: Scope Out: Findings:                 LA Grade B (one or more mucosal breaks greater than                            5 mm, not extending between the tops of two mucosal                            folds) esophagitis with no bleeding was found 28 to                            30 cm from the incisors. Biopsies were taken with a                            cold forceps for histology. Verification of patient                            identification for the specimen was done. Estimated                            blood loss was minimal.                           A 4 cm hiatal hernia was present. 31-35 cm  Localized mild inflammation characterized by                            erythema, friability and granularity was found on                            the posterior wall of the gastric antrum. Biopsies                            were taken with a cold forceps for histology.                            Verification of patient identification for the                            specimen was done. Estimated blood loss was minimal.                           The gastroesophageal flap valve was visualized                            endoscopically and classified as Hill Grade II                            (fold present, opens with respiration).                           The exam was otherwise without abnormality.                           The cardia and gastric fundus were otherwise normal                            on retroflexion. Complications:            No immediate complications. Estimated Blood Loss:     Estimated blood loss was minimal. Impression:               - LA Grade B reflux esophagitis  with no bleeding.                            Biopsied.                           - 4 cm hiatal hernia. 31-35 cm                           - Gastritis focal area posterior wall of antrum.                            Biopsied.                           - Gastroesophageal flap valve classified as Hill  Grade II (fold present, opens with respiration).                           - The examination was otherwise normal. She did                            have transient O2 desaturation w/o complications. Recommendation:           - Patient has a contact number available for                            emergencies. The signs and symptoms of potential                            delayed complications were discussed with the                            patient. Return to normal activities tomorrow.                            Written discharge instructions were provided to the                            patient.                           - Continue present medications.                           - Await pathology results. Will communicate w/ Dr.                            Adela Lank primary GI MD                           - GERD diet and lifestyle changes                           Stay on bid PPI (changed 10/9) and also would take                            carafate tid ac regularly instead of prn Iva Boop, MD 05/16/2023 10:30:35 AM This report has been signed electronically.

## 2023-05-16 NOTE — Progress Notes (Signed)
Pt's states no medical or surgical changes since previsit or office visit. 

## 2023-05-17 ENCOUNTER — Encounter: Payer: PPO | Admitting: Gastroenterology

## 2023-05-17 ENCOUNTER — Telehealth: Payer: Self-pay | Admitting: *Deleted

## 2023-05-17 DIAGNOSIS — K920 Hematemesis: Secondary | ICD-10-CM

## 2023-05-17 NOTE — Telephone Encounter (Signed)
  Follow up Call-     05/16/2023    9:09 AM  Call back number  Post procedure Call Back phone  # [phone]  Permission to leave phone message Yes     Patient questions:  Do you have a fever, pain , or abdominal swelling? No. Pain Score  0 *  Have you tolerated food without any problems? Yes.    Have you been able to return to your normal activities? Yes.    Do you have any questions about your discharge instructions: Diet   No. Medications  No. Follow up visit  No.  Do you have questions or concerns about your Care? No.  Actions: * If pain score is 4 or above: No action needed, pain <4.

## 2023-05-18 LAB — SURGICAL PATHOLOGY

## 2023-05-18 NOTE — Telephone Encounter (Signed)
Inbound call from patient, states she is constipated, and her stool is black and tarry. Patient would like to discuss with a nurse. Patient last saw Dr. Leone Payor for procedure due to her coming on the wrong day, but is an [facility] patient.

## 2023-05-18 NOTE — Addendum Note (Signed)
Addended by: Richardson Chiquito on: 05/18/2023 05:14 PM   Modules accepted: Orders

## 2023-05-18 NOTE — Telephone Encounter (Signed)
Patient calls today complaining of constipation that "started yesterday." Says she had to manually pull stool from the rectum and it was hard, "black as tar." Patient states that she has achy discomfort in the lower pelvic area.  Normally has chronic diarrhea; says that she did have some problems several weeks back with "impaction" as well so she has not been taking imodium, pepto recently.  Patient did have EGD 05/15/23 for hematemesis which showed esophagitis; biopsies were done. She is taking carafate and PPI.  Patient says she started taking metamucil and drinking lots of fluids last night.  Advised to hydrate, continue metamucil and try fleets enema tonight. Patient is to call back to give an update tomorrow.

## 2023-05-18 NOTE — Telephone Encounter (Signed)
Patient denies any new onset dizziness, SOB, chest pain, weakness. She is advised that should she have any new symptoms like this, she should report to the emergency room. Patient is asked to come for labs tomorrow for reassurance and is asked to move forward with enema tonight as previously discussed.

## 2023-05-19 NOTE — Telephone Encounter (Signed)
Patient states that she has had 2 bowel movements since yesterday. Only the very first part was black; the rest of the bowel movement was normal brown in color. She states she now feels better.

## 2023-05-19 NOTE — Telephone Encounter (Signed)
Inbound call from patient, states she is doing much better, she states she doesn't think she needs to get lab work done. States she had a bowel movement and is in normal color.

## 2023-05-30 ENCOUNTER — Telehealth: Payer: Self-pay | Admitting: Internal Medicine

## 2023-05-30 NOTE — Telephone Encounter (Signed)
Patient called in c/o dizziness/lightheaded and left leg pain. Should she come in to see you or her vascular doctor? BP this morning is144/75. Patient reports that eating does not help the dizziness.

## 2023-05-30 NOTE — Telephone Encounter (Signed)
Patient returned your call. Requested a call back.

## 2023-05-30 NOTE — Telephone Encounter (Signed)
The dizziness and lightheadedness would not be caused by possible blood clot in her leg but if she is concerned about a possible pulmonary embolism she should seek emergency attention.  However we had the patient come up with a DVT study only to rule out DVT

## 2023-05-30 NOTE — Telephone Encounter (Signed)
Patient called stating she has pain and redness in her left leg, along with dizziness and lightheadedness. She is worry about having a blood clot, or wondering should she call her PCP.   Please advise

## 2023-05-30 NOTE — Telephone Encounter (Signed)
Looks like GI telephone call and PCP call somehow got intertwined.  Patient called back following a call from [name] regarding recent endoscopy report findings and recommendations. We did go over these findings and recommendations. Patient states that she continues to have some burning discomfort in her chest at times as well as constipation. However, she just recently stopped cholestyramine and started increased miralax dosing so she will give this another week to get adjusted to and will call us if continuing with no improvement.  Regarding the dizziness, lightheadedness and pain/redness in leg, I asked that she seek evaluation emergently for this. She verbalizes understanding.

## 2023-05-31 ENCOUNTER — Other Ambulatory Visit (INDEPENDENT_AMBULATORY_CARE_PROVIDER_SITE_OTHER): Payer: Self-pay | Admitting: Nurse Practitioner

## 2023-05-31 ENCOUNTER — Telehealth (INDEPENDENT_AMBULATORY_CARE_PROVIDER_SITE_OTHER): Payer: Self-pay

## 2023-05-31 DIAGNOSIS — M79605 Pain in left leg: Secondary | ICD-10-CM

## 2023-05-31 NOTE — Telephone Encounter (Signed)
[name] can you call and make and appt for [facility]. She needs a DVT ultrasound. - per Vivia Birmingham

## 2023-05-31 NOTE — Telephone Encounter (Addendum)
Continued from another encounter that got crossed with the GI clinic on 05/30/23  You16 hours ago (4:24 PM)   Patient called stating she has pain and redness in her left leg, along with dizziness and lightheadedness. She is worry about having a blood clot, or wondering should she call her PCP.    Please advise      You routed conversation to Georgiana Spinner, NP16 hours ago (4:24 PM)   Per Sheppard Plumber  The dizziness and lightheadedness would not be caused by possible blood clot in her leg but if she is concerned about a possible pulmonary embolism she should seek emergency attention.  However we had the patient come up with a DVT study only to rule out DVT         Called patient 05/31/23  Left voicemail to return call

## 2023-06-02 ENCOUNTER — Encounter: Payer: Self-pay | Admitting: Internal Medicine

## 2023-06-02 ENCOUNTER — Encounter (INDEPENDENT_AMBULATORY_CARE_PROVIDER_SITE_OTHER): Payer: PPO

## 2023-06-02 ENCOUNTER — Ambulatory Visit (INDEPENDENT_AMBULATORY_CARE_PROVIDER_SITE_OTHER): Payer: PPO | Admitting: Internal Medicine

## 2023-06-02 ENCOUNTER — Ambulatory Visit (INDEPENDENT_AMBULATORY_CARE_PROVIDER_SITE_OTHER): Payer: PPO

## 2023-06-02 VITALS — BP 110/60 | HR 68 | Ht 66.0 in | Wt 186.8 lb

## 2023-06-02 DIAGNOSIS — M79605 Pain in left leg: Secondary | ICD-10-CM | POA: Insufficient documentation

## 2023-06-02 DIAGNOSIS — Z13820 Encounter for screening for osteoporosis: Secondary | ICD-10-CM

## 2023-06-02 DIAGNOSIS — I890 Lymphedema, not elsewhere classified: Secondary | ICD-10-CM | POA: Diagnosis not present

## 2023-06-02 DIAGNOSIS — K219 Gastro-esophageal reflux disease without esophagitis: Secondary | ICD-10-CM | POA: Diagnosis not present

## 2023-06-02 DIAGNOSIS — E782 Mixed hyperlipidemia: Secondary | ICD-10-CM

## 2023-06-02 DIAGNOSIS — I10 Essential (primary) hypertension: Secondary | ICD-10-CM

## 2023-06-02 DIAGNOSIS — R42 Dizziness and giddiness: Secondary | ICD-10-CM | POA: Diagnosis not present

## 2023-06-02 NOTE — Progress Notes (Signed)
Established Patient Office Visit  Subjective:  Patient ID: [name], female    DOB: [birth year]  Age: 82 y.o. MRN: [id number]  Chief Complaint  Patient presents with   Follow-up    6 week    Patient comes in for her scheduled follow-up appointment today.  She had called earlier this week with complaints of dizziness lightheadedness and left lower leg pain and tenderness.  Her blood pressure was stable at that time.  Since then patient has contacted her GI vascular surgeon. She is scheduled for left lower leg venous Doppler today.  Patient reports that she has history of lymphedema more of the left leg, but currently her left leg is only slightly swollen more than the right and there is no redness or stasis dermatitis. Her dizziness is also little better today but she does report of history of positional vertigo and postural hypotension.  She has been advised to wear compression stockings and use a cane to help her stability. Patient denies any headache, no chest pain, no palpitations today. On exam her left lower leg is slightly tender, more anteriorly than at the back.  No discoloration.  However we will proceed with a left lower leg venous Doppler at her vascular surgeons office.     No other concerns at this time.   Past Medical History:  Diagnosis Date   Acid reflux    Allergy    Anemia    Anxiety    in past   Arthritis    all over   Asthma    Atypical moles    Bronchitis    Bronchospasm    reactive with perfume/cologne and smoke   CAD (coronary artery disease)    patient denies on preop of 10/24    Candida esophagitis (HCC)    Carotid artery calcification, bilateral 04/2016   Carpal tunnel syndrome of left wrist 12/03/2019   Cataract    Cervical stenosis of spine    Chronic insomnia    Complication of anesthesia    shoulder surgery- not all the way asleep   COVID-19 02/04/2021   also 10/3 or 04/20/21 sxs started 04/05/21   Depression    in past    Diverticulitis    Diverticulosis    Dizziness    Elevated liver enzymes    Family history of adverse reaction to anesthesia    sister- has problems with nausea and vomiting    Fatigue    Fatty liver    Generalized headache    migraines   GERD (gastroesophageal reflux disease)    Gross hematuria 08/18/2022   Hereditary and idiopathic peripheral neuropathy 05/11/2015   History of chemotherapy    topical cream for h/o skin CA   History of kidney stones    History of neuropathy    FH Dr. Allena Katz neurology   History of vertigo    Hx of migraines    Hyperlipidemia    Hypothyroidism    Insomnia    Irritable bowel syndrome    LLQ abdominal pain    Melanoma (HCC) 2013   Melanoma (HCC)    x2 stg II melanoma   Neuropathy, peripheral    Peripheral neuropathy    Pneumonia    hx of x 2   PONV (postoperative nausea and vomiting)    Pre-diabetes    Retina disorder    left   SCC (squamous cell carcinoma)    skin    Skin cancer 07/17/2019   SCC left leg s/p  mohs   SCC 05/01/18 left medial mid leg, right post mid leg 06/08/18, left mid well 04/10/19  Dr. Emily Filbert derm, Dr. Glennis Brink mohs       Stroke [facility])    ? TIA per Dr V per patient    Thyroid disease    Urine incontinence    Viral URI with cough 06/09/2022   Vitamin D deficiency    Weight gain     Past Surgical History:  Procedure Laterality Date   ABLATION Bilateral    Dr. Wyn Quaker 12/2020 veins laser ablation of both great saphenous veins in a staged fashion.   BILATERAL SALPINGOOPHORECTOMY     ovarian cyst Dr. Dareen Piano and Dr. Allie Dimmer 2008   BREAST SURGERY     in 2014 bx breast calcifications   BREAST SURGERY     augmentation with saline Dr. Karmen Bongo 1995    CHOLECYSTECTOMY N/A 01/28/2015   Procedure: LAPAROSCOPIC CHOLECYSTECTOMY WITH INTRAOPERATIVE CHOLANGIOGRAM;  Surgeon: Chevis Pretty III, MD;  Location: [facility] OR;  Service: General;  Laterality: N/A;   CLOSED MANIPULATION SHOULDER     rt   COLONOSCOPY WITH PROPOFOL N/A  11/20/2012   Procedure: COLONOSCOPY WITH PROPOFOL;  Surgeon: Charolett Bumpers, MD;  Location: [facility] ENDOSCOPY;  Service: Endoscopy;  Laterality: N/A;   colonscopy     May 2014   JOINT REPLACEMENT     RTK   KNEE ARTHROSCOPY     left knee miniscus tear repair     MELANOMA EXCISION WITH SENTINEL LYMPH NODE BIOPSY  03/18/2011   Back, nodes neg   OOPHORECTOMY     ROTATOR CUFF REPAIR     left   SHOULDER SURGERY     right and left    SKIN CANCER EXCISION     multiple   TOTAL HIP ARTHROPLASTY     TOTAL KNEE ARTHROPLASTY     right 2010 and left in 2018    TOTAL KNEE ARTHROPLASTY Left 05/16/2017   Procedure: LEFT TOTAL KNEE ARTHROPLASTY;  Surgeon: Durene Romans, MD;  Location: [facility] ORS;  Service: Orthopedics;  Laterality: Left;  70 mins   TUBAL LIGATION     TUMOR REMOVAL     ovary    Social History   Socioeconomic History   Marital status: Divorced    Spouse name: Not on file   Number of children: 3   Years of education: College   Highest education level: Some college, no degree  Occupational History   Occupation: Retired    Associate Professor: RETIRED  Tobacco Use   Smoking status: Never   Smokeless tobacco: Never  Vaping Use   Vaping status: Never Used  Substance and Sexual Activity   Alcohol use: Yes    Alcohol/week: 1.0 standard drink of alcohol    Types: 1 Cans of beer per week    Comment: once a week beer or wine   Drug use: No   Sexual activity: Not Currently  Other Topics Concern   Not on file  Social History Narrative   Patient lives at home alone in a one story home.   Has 3 children (2 daughters and 1 son)    Retired from [employer].   Divorced since 1992    Caffeine Use: 1 cup daily and a soft drink occasionally    No guns    Wears seat belt     Lives with cat    Left handed   Social Determinants of Health   Financial Resource Strain: Low Risk  (  04/18/2023)   Overall Financial Resource Strain (CARDIA)    Difficulty of Paying Living Expenses: Not hard  at all  Food Insecurity: No Food Insecurity (04/18/2023)   Hunger Vital Sign    Worried About Running Out of Food in the Last Year: Never true    Ran Out of Food in the Last Year: Never true  Transportation Needs: No Transportation Needs (04/18/2023)   PRAPARE - Administrator, Civil Service (Medical): No    Lack of Transportation (Non-Medical): No  Physical Activity: Inactive (04/18/2023)   Exercise Vital Sign    Days of Exercise per Week: 0 days    Minutes of Exercise per Session: 0 min  Stress: Stress Concern Present (04/18/2023)   [employer] of Occupational Health - Occupational Stress Questionnaire    Feeling of Stress : To some extent  Social Connections: Moderately Integrated (04/18/2023)   Social Connection and Isolation Panel [NHANES]    Frequency of Communication with Friends and Family: More than three times a week    Frequency of Social Gatherings with Friends and Family: Three times a week    Attends Religious Services: More than 4 times per year    Active Member of Clubs or Organizations: Yes    Attends Banker Meetings: More than 4 times per year    Marital Status: Divorced  Intimate Partner Violence: Not At Risk (04/18/2023)   Humiliation, Afraid, Rape, and Kick questionnaire    Fear of Current or Ex-Partner: No    Emotionally Abused: No    Physically Abused: No    Sexually Abused: No    Family History  Problem Relation Age of Onset   Hypertension Mother    Stroke Mother    Hyperlipidemia Mother    Heart disease Father    Diabetes Father    Hypertension Father    Hyperlipidemia Father    Early death Father    Diabetes Sister    Heart disease Sister        heart murmur   Arthritis Sister    COPD Sister    Hyperlipidemia Sister    Hypertension Sister    Colon polyps Sister        adenomous   Arthritis Sister    Heart disease Sister    Hyperlipidemia Sister    Hypertension Sister    Cancer Sister    Dementia Sister     Tremor Sister    Stroke Maternal Grandmother    Colon cancer Maternal Grandfather    Cancer Maternal Grandfather    Early death Maternal Grandfather    Early death Paternal Grandmother    Stroke Paternal Grandmother    Heart disease Daughter    Hyperlipidemia Daughter    Hypertension Daughter    Heart disease Son    Hyperlipidemia Son    Alcohol abuse Son    Depression Son    Diabetes Son    Pancreatic cancer Maternal Uncle    Alzheimer's disease Paternal Aunt    Alzheimer's disease Paternal Aunt    Alzheimer's disease Paternal Aunt    Alzheimer's disease Paternal Aunt    Cancer Other        2 paternal uncles and 1 maternal uncle colon cancer   Esophageal cancer Neg Hx    Stomach cancer Neg Hx     Allergies  Allergen Reactions   Epinephrine Other (See Comments) and Anaphylaxis    Heart palpitations   Penicillins Swelling and Other (See Comments)  Lip swelling Has patient had a PCN reaction causing immediate rash, facial/tongue/throat swelling, SOB or lightheadedness with hypotension: Yes Has patient had a PCN reaction causing severe rash involving mucus membranes or skin necrosis: No Has patient had a PCN reaction that required hospitalization No Has patient had a PCN reaction occurring within the last 10 years: No If all of the above answers are "NO", then may proceed with Cephalosporin use.   Other     Black pepper- caused bronchial tubes to start closing    Valium [Diazepam] Other (See Comments)    Other reaction(s): Other (See Comments) Overly sensitive  sedation    Outpatient Medications Prior to Visit  Medication Sig   albuterol (PROAIR HFA) 108 (90 Base) MCG/ACT inhaler Inhale 1-2 puffs into the lungs every 6 (six) hours as needed for wheezing or shortness of breath.   aspirin 81 MG tablet TAKE 1 TABLET BY MOUTH EVERY DAY   atorvastatin (LIPITOR) 20 MG tablet Take 1 tablet (20 mg total) by mouth daily.   Biotin 1000 MCG tablet Take 1,000 mcg by mouth daily.    BREO ELLIPTA 100-25 MCG/ACT AEPB Inhale 1 puff into the lungs daily.   Calcium 600-200 MG-UNIT tablet Take 1 tablet daily by mouth.   Cholecalciferol (VITAMIN D3) 2000 UNITS capsule Take 2,000 Units by mouth daily.   cholestyramine (QUESTRAN) 4 g packet Take 1 packet (4 g total) by mouth 2 (two) times daily as needed.   citalopram (CELEXA) 20 MG tablet Take 1 tablet (20 mg total) by mouth daily.   FOLIC ACID PO Take by mouth daily.   levothyroxine (SYNTHROID) 75 MCG tablet Take 1 tablet (75 mcg total) by mouth daily before breakfast.   Magnesium 250 MG TABS Take by mouth.   olmesartan (BENICAR) 20 MG tablet Take 0.5 tablets (10 mg total) by mouth in the morning and at bedtime. D/c 5 mg (10 mg bid of 5 mg)   Omega-3 Fatty Acids (FISH OIL) 1000 MG CAPS Take 1,000 mg by mouth daily.    ondansetron (ZOFRAN) 4 MG tablet Take 4 mg by mouth every 8 (eight) hours as needed for nausea or vomiting.   pantoprazole (PROTONIX) 40 MG tablet Take 1 tablet (40 mg total) by mouth 2 (two) times daily.   Potassium (POTASSIMIN PO) Take by mouth.   sucralfate (CARAFATE) 1 g tablet Take 1 tablet (1 g total) by mouth every 8 (eight) hours as needed. Dissolve 1 tablet (1 gram) into 10 ml warm water and drink as directed.   vitamin B-12 (CYANOCOBALAMIN) 1000 MCG tablet Take 2,000 mcg by mouth daily. 2 tabs   No facility-administered medications prior to visit.    Review of Systems  Constitutional: Negative.  Negative for chills, diaphoresis, fever, malaise/fatigue and weight loss.  HENT: Negative.  Negative for congestion and sore throat.   Eyes: Negative.  Negative for blurred vision.  Respiratory: Negative.  Negative for cough and shortness of breath.   Cardiovascular: Negative.  Negative for chest pain, palpitations and leg swelling.  Gastrointestinal: Negative.  Negative for abdominal pain, constipation, diarrhea, heartburn, nausea and vomiting.  Genitourinary: Negative.  Negative for dysuria and flank pain.   Musculoskeletal:  Positive for myalgias. Negative for joint pain.  Skin: Negative.   Neurological:  Positive for dizziness. Negative for tingling, tremors, speech change and headaches.  Endo/Heme/Allergies: Negative.   Psychiatric/Behavioral: Negative.  Negative for depression and suicidal ideas. The patient is not nervous/anxious.        Objective:  BP 110/60   Pulse 68   Ht 5\' 6"  (1.676 m)   Wt 186 lb 12.8 oz (84.7 kg)   SpO2 97%   BMI 30.15 kg/m   Vitals:   06/02/23 1120  BP: 110/60  Pulse: 68  Height: 5\' 6"  (1.676 m)  Weight: 186 lb 12.8 oz (84.7 kg)  SpO2: 97%  BMI (Calculated): 30.16    Physical Exam Vitals and nursing note reviewed.  Constitutional:      Appearance: Normal appearance.  HENT:     Head: Normocephalic and atraumatic.     Nose: Nose normal.     Mouth/Throat:     Mouth: Mucous membranes are moist.     Pharynx: Oropharynx is clear.  Eyes:     Conjunctiva/sclera: Conjunctivae normal.     Pupils: Pupils are equal, round, and reactive to light.  Cardiovascular:     Rate and Rhythm: Normal rate and regular rhythm.     Pulses: Normal pulses.     Heart sounds: Normal heart sounds. No murmur heard. Pulmonary:     Effort: Pulmonary effort is normal.     Breath sounds: Normal breath sounds. No wheezing.  Abdominal:     General: Bowel sounds are normal.     Palpations: Abdomen is soft.     Tenderness: There is no abdominal tenderness. There is no right CVA tenderness or left CVA tenderness.  Musculoskeletal:        General: Normal range of motion.     Cervical back: Normal range of motion.     Right lower leg: No edema.     Left lower leg: No edema.  Skin:    General: Skin is warm and dry.  Neurological:     General: No focal deficit present.     Mental Status: She is alert and oriented to person, place, and time.  Psychiatric:        Mood and Affect: Mood normal.        Behavior: Behavior normal.      No results found for any visits on  06/02/23.  Recent Results (from the past 2160 hour(s))  Basic metabolic panel     Status: Abnormal   Collection Time: 03/21/23  3:13 PM  Result Value Ref Range   Sodium 138 135 - 145 mmol/L   Potassium 3.9 3.5 - 5.1 mmol/L   Chloride 103 98 - 111 mmol/L   CO2 24 22 - 32 mmol/L   Glucose, Bld 98 70 - 99 mg/dL    Comment: Glucose reference range applies only to samples taken after fasting for at least 8 hours.   BUN 16 8 - 23 mg/dL   Creatinine, Ser 7.82 0.44 - 1.00 mg/dL   Calcium 8.8 (L) 8.9 - 10.3 mg/dL   GFR, Estimated 58 (L) >60 mL/min    Comment: (NOTE) Calculated using the CKD-EPI Creatinine Equation (2021)    Anion gap 11 5 - 15    Comment: Performed at [facility], [address] [place], [place] [address]  CBC     Status: None   Collection Time: 03/21/23  3:13 PM  Result Value Ref Range   WBC 6.2 4.0 - 10.5 K/uL   RBC 4.40 3.87 - 5.11 MIL/uL   Hemoglobin 13.1 12.0 - 15.0 g/dL   HCT 30.8 65.7 - 84.6 %   MCV 92.5 80.0 - 100.0 fL   MCH 29.8 26.0 - 34.0 pg   MCHC 32.2 30.0 - 36.0 g/dL   RDW 96.2  11.5 - 15.5 %   Platelets 195 150 - 400 K/uL   nRBC 0.0 0.0 - 0.2 %    Comment: Performed at [facility], [address] [place], [place] [address]  Troponin I (High Sensitivity)     Status: None   Collection Time: 03/21/23  3:13 PM  Result Value Ref Range   Troponin I (High Sensitivity) 4 <18 ng/L    Comment: (NOTE) Elevated high sensitivity troponin I (hsTnI) values and significant  changes across serial measurements may suggest ACS but many other  chronic and acute conditions are known to elevate hsTnI results.  Refer to the "Links" section for chest pain algorithms and additional  guidance. Performed at [facility], [address] [place], [place] [address]   Lipase, blood     Status: None   Collection Time: 03/21/23  3:13 PM  Result Value Ref Range   Lipase 38 11 - 51 U/L    Comment: Performed at [facility], [address] [place], [place] [address]  Hepatic function panel     Status: Abnormal   Collection Time: 03/21/23  3:13 PM  Result Value Ref Range   Total Protein 6.4 (L) 6.5 - 8.1 g/dL   Albumin 3.9 3.5 - 5.0 g/dL   AST 17 15 - 41 U/L   ALT 14 0 - 44 U/L   Alkaline Phosphatase 55 38 - 126 U/L   Total Bilirubin 1.6 (H) 0.3 - 1.2 mg/dL   Bilirubin, Direct 0.2 0.0 - 0.2 mg/dL   Indirect Bilirubin 1.4 (H) 0.3 - 0.9 mg/dL    Comment: Performed at [facility], [address] [place], [place] [address]  Urinalysis, Routine w reflex microscopic -Urine, Clean Catch     Status: Abnormal   Collection Time: 03/21/23  5:14 PM  Result Value Ref Range   Color, Urine STRAW (A) YELLOW   APPearance CLEAR (A) CLEAR   Specific Gravity, Urine 1.005 1.005 - 1.030   pH 6.0 5.0 - 8.0   Glucose, UA NEGATIVE NEGATIVE mg/dL   Hgb urine dipstick NEGATIVE NEGATIVE   Bilirubin Urine NEGATIVE NEGATIVE   Ketones, ur NEGATIVE NEGATIVE mg/dL   Protein, ur NEGATIVE NEGATIVE mg/dL   Nitrite NEGATIVE NEGATIVE   Leukocytes,Ua SMALL (A) NEGATIVE   RBC / HPF 0-5 0 - 5 RBC/hpf   WBC, UA 6-10 0 - 5 WBC/hpf   Bacteria, UA NONE SEEN NONE SEEN   Squamous Epithelial / HPF 0-5 0 - 5 /HPF    Comment: Performed at [facility], [address] [place], [place] [address]  SARS Coronavirus 2 by RT PCR (hospital order, performed in [facility] hospital lab) *cepheid single result test* Anterior Nasal Swab     Status: None   Collection Time: 03/21/23  6:31 PM   Specimen: Anterior Nasal Swab  Result Value Ref Range   SARS Coronavirus 2 by RT PCR NEGATIVE NEGATIVE    Comment: (NOTE) SARS-CoV-2 target nucleic acids are NOT DETECTED.  The SARS-CoV-2 RNA is generally detectable in upper and lower respiratory specimens during the acute phase of infection. The lowest concentration of SARS-CoV-2 viral copies this assay can detect is 250 copies / mL. A negative result does not preclude SARS-CoV-2 infection and  should not be used as the sole basis for treatment or other patient management decisions.  A negative result may occur with improper specimen collection / handling, submission of specimen other than nasopharyngeal swab, presence of viral mutation(s) within the  areas targeted by this assay, and inadequate number of viral copies (<250 copies / mL). A negative result must be combined with clinical observations, patient history, and epidemiological information.  Fact Sheet for Patients:   RoadLapTop.co.za  Fact Sheet for Healthcare Providers: http://kim-miller.com/  This test is not yet approved or  cleared by the [place] FDA and has been authorized for detection and/or diagnosis of SARS-CoV-2 by FDA under an Emergency Use Authorization (EUA).  This EUA will remain in effect (meaning this test can be used) for the duration of the COVID-19 declaration under Section 564(b)(1) of the Act, 21 U.S.C. section 360bbb-3(b)(1), unless the authorization is terminated or revoked sooner.  Performed at [facility], [address] [place], [place] [address]   POCT Urinalysis Dipstick [phone])     Status: None   Collection Time: 04/21/23 11:25 AM  Result Value Ref Range   Color, UA yellow    Clarity, UA clear    Glucose, UA Negative Negative   Bilirubin, UA neg    Ketones, UA neg    Spec Grav, UA 1.025 1.010 - 1.025   Blood, UA neg    pH, UA 5.5 5.0 - 8.0   Protein, UA Negative Negative   Urobilinogen, UA 0.2 0.2 or 1.0 E.U./dL   Nitrite, UA neg    Leukocytes, UA Negative Negative   Appearance clear    Odor yes   CBC with Diff     Status: None   Collection Time: 04/24/23  8:37 AM  Result Value Ref Range   WBC 5.6 3.4 - 10.8 x10E3/uL   RBC 4.31 3.77 - 5.28 x10E6/uL   Hemoglobin 13.0 11.1 - 15.9 g/dL   Hematocrit 82.9 56.2 - 46.6 %   MCV 93 79 - 97 fL   MCH 30.2 26.6 - 33.0 pg   MCHC 32.3 31.5 - 35.7 g/dL   RDW 13.0 86.5 -  78.4 %   Platelets 197 150 - 450 x10E3/uL   Neutrophils 56 Not Estab. %   Lymphs 32 Not Estab. %   Monocytes 8 Not Estab. %   Eos 3 Not Estab. %   Basos 1 Not Estab. %   Neutrophils Absolute 3.1 1.4 - 7.0 x10E3/uL   Lymphocytes Absolute 1.8 0.7 - 3.1 x10E3/uL   Monocytes Absolute 0.5 0.1 - 0.9 x10E3/uL   EOS (ABSOLUTE) 0.2 0.0 - 0.4 x10E3/uL   Basophils Absolute 0.0 0.0 - 0.2 x10E3/uL   Immature Granulocytes 0 Not Estab. %   Immature Grans (Abs) 0.0 0.0 - 0.1 x10E3/uL  Lipid Panel w/o Chol/HDL Ratio     Status: None   Collection Time: 04/24/23  8:37 AM  Result Value Ref Range   Cholesterol, Total 119 100 - 199 mg/dL   Triglycerides 77 0 - 149 mg/dL   HDL 65 >69 mg/dL   VLDL Cholesterol Cal 15 5 - 40 mg/dL   LDL Chol Calc (NIH) 39 0 - 99 mg/dL  [id number]     Status: Abnormal   Collection Time: 04/24/23  8:37 AM  Result Value Ref Range   Glucose 108 (H) 70 - 99 mg/dL   BUN 17 8 - 27 mg/dL   Creatinine, Ser 2.44 (H) 0.57 - 1.00 mg/dL   eGFR 50 (L) >01 UU/VOZ/3.66   BUN/Creatinine Ratio 15 12 - 28   Sodium 145 (H) 134 - 144 mmol/L   Potassium 4.5 3.5 - 5.2 mmol/L   Chloride 108 (H) 96 - 106 mmol/L   CO2 21 20 -  29 mmol/L   Calcium 9.4 8.7 - 10.3 mg/dL   Total Protein 6.0 6.0 - 8.5 g/dL   Albumin 4.1 3.7 - 4.7 g/dL   Globulin, Total 1.9 1.5 - 4.5 g/dL   Bilirubin Total 1.6 (H) 0.0 - 1.2 mg/dL   Alkaline Phosphatase 69 44 - 121 IU/L   AST 16 0 - 40 IU/L   ALT 12 0 - 32 IU/L  TSH+T4F+T3Free     Status: None   Collection Time: 04/24/23  8:37 AM  Result Value Ref Range   TSH 1.420 0.450 - 4.500 uIU/mL   T3, Free 2.5 2.0 - 4.4 pg/mL   Free T4 1.70 0.82 - 1.77 ng/dL  Vitamin Z61     Status: Abnormal   Collection Time: 04/24/23  8:37 AM  Result Value Ref Range   Vitamin B-12 1,269 (H) 232 - 1,245 pg/mL  Vitamin D (25 hydroxy)     Status: None   Collection Time: 04/24/23  8:37 AM  Result Value Ref Range   Vit D, 25-Hydroxy 61.4 30.0 - 100.0 ng/mL    Comment: Vitamin D  deficiency has been defined by the Institute of Medicine and an Endocrine Society practice guideline as a level of serum 25-OH vitamin D less than 20 ng/mL (1,2). The Endocrine Society went on to further define vitamin D insufficiency as a level between 21 and 29 ng/mL (2). 1. IOM (Institute of Medicine). 2010. Dietary reference    intakes for calcium and D. [place]: The    [employer]. 2. Holick MF, Binkley Hatillo, Bischoff-Ferrari HA, et al.    Evaluation, treatment, and prevention of vitamin D    deficiency: an Endocrine Society clinical practice    guideline. JCEM. 2011 Jul; 96(7):1911-30.   RBC Folate     Status: None   Collection Time: 04/24/23  8:37 AM  Result Value Ref Range   Folate, Hemolysate 515.0 Not Estab. ng/mL   Folate, RBC 1,281 >498 ng/mL  Magnesium     Status: None   Collection Time: 04/24/23  8:37 AM  Result Value Ref Range   Magnesium 2.0 1.6 - 2.3 mg/dL  Surgical pathology ([facility])     Status: None   Collection Time: 05/16/23 12:00 AM  Result Value Ref Range   SURGICAL PATHOLOGY      SURGICAL PATHOLOGY [facility] [address], [address] [place], [place] [address] Telephone [phone] or [phone] Fax [phone]  REPORT OF SURGICAL PATHOLOGY   Accession #: [id number] Patient Name: [name], [name] Visit # : [id number]  MRN: [id number] Physician: Stan Head DOB/Age 11/07/1940 (Age: 82) Gender: F Collected Date: 05/16/2023 Received Date: 05/17/2023  FINAL DIAGNOSIS       1. Surgical [P], gastric antrum :       GASTRIC ANTRAL MUCOSA WITH FOCAL MILD CHRONIC INACTIVE GASTRITIS AND REACTIVE      EPITHELIAL CHANGES.      FOCAL INCREASED LAMINA PROPRIA EOSINOPHILS (UP TO 100 EOSINOPHILS / HIGH POWER      FIELD).      NO H. PYLORI IDENTIFIED ON H&E STAIN.      NEGATIVE FOR INTESTINAL METAPLASIA OR DYSPLASIA.      SEE NOTE.       2. Surgical [P], distal esophagus :       SQUAMOUS MUCOSA WITH  REACTIVE EPITHELIAL CHANGES AND SEPARATED FIBRINOUS      PURULENT EXUDATES, SUGGESTIVE OF REFLUX DISEASE.      NEGATIVE FOR  INTESTINAL METAPLASIA OR  DYSPLASIA.       Diagnosis Note : There are focal increased lamina propria eosinophils (up to 100      eosinophils / high power filed) with eosinophilic aggregates.The findings could      be associated with medication effect, allergic reaction, or entirely      non-specific.The possibility of eosinophilic gastritis is low but cannot be      entirely excluded.Correlation with clinical information is recommended.      ELECTRONIC SIGNATURE : Lance Coon Md, Pathologist, Electronic Signature  MICROSCOPIC DESCRIPTION  CASE COMMENTS STAINS USED IN DIAGNOSIS: H&E H&E    CLINICAL HISTORY  SPECIMEN(S) OBTAINED 1. Surgical [P], Gastric Antrum 2. Surgical [P], Distal Esophagus  SPECIMEN COMMENTS: 1. Hematemesis, unspecified whether nausea present; gastroesophageal reflux disease unspecified whether esophagitis present SPECIMEN CLINICAL INFORMATION: 1. R/O H.pylori 2. R/O other reflux    Gross Description 1. Received in formalin are ta n, soft tissue fragments that are submitted in toto.Number: 2, Size: 0.2 cm smallest to 0.4 cm largest, (1B) ( TA ) 2. Received in formalin are tan, soft tissue fragments that are submitted in toto.Number: 2 Size: 0.3 cm, (1B) ( TA )        Report signed out from the following location(s) [place] [address], [place] [address] CLIA #: [id number]  [facility] [address] [place], [place] [address] CLIA #: [id number]       Assessment & Plan:  Patient advised to continue all her medications.  To proceed with her venous Doppler of the left leg.  Advised to wear compression stockings.  Need to schedule her DEXA scan. Problem List Items Addressed This Visit     Hyperlipidemia   GERD (gastroesophageal reflux disease)   Essential  hypertension, benign - Primary   Relevant Orders   CMP14+EGFR   Lymphedema   Screening for osteoporosis   Relevant Orders   DG Bone Density   Left leg pain   Dizziness    Return in about 1 month (around 07/02/2023).   Total time spent: 30 minutes  Margaretann Loveless, MD  06/02/2023   This document may have been prepared by [facility] Voice Recognition software and as such may include unintentional dictation errors.

## 2023-06-08 ENCOUNTER — Ambulatory Visit: Payer: PPO | Attending: Pulmonary Disease

## 2023-06-08 DIAGNOSIS — R0609 Other forms of dyspnea: Secondary | ICD-10-CM | POA: Insufficient documentation

## 2023-06-08 DIAGNOSIS — J683 Other acute and subacute respiratory conditions due to chemicals, gases, fumes and vapors: Secondary | ICD-10-CM | POA: Diagnosis not present

## 2023-06-08 DIAGNOSIS — R059 Cough, unspecified: Secondary | ICD-10-CM | POA: Diagnosis not present

## 2023-06-08 DIAGNOSIS — R0602 Shortness of breath: Secondary | ICD-10-CM | POA: Insufficient documentation

## 2023-06-08 LAB — PULMONARY FUNCTION TEST ARMC ONLY
DL/VA % pred: 85 %
DL/VA: 3.43 ml/min/mmHg/L
DLCO unc % pred: 81 %
DLCO unc: 16.21 ml/min/mmHg
FEF 25-75 Post: 1.39 L/s
FEF 25-75 Pre: 1.27 L/s
FEF2575-%Change-Post: 9 %
FEF2575-%Pred-Post: 97 %
FEF2575-%Pred-Pre: 89 %
FEV1-%Change-Post: 5 %
FEV1-%Pred-Post: 80 %
FEV1-%Pred-Pre: 75 %
FEV1-Post: 1.66 L
FEV1-Pre: 1.56 L
FEV1FVC-%Change-Post: 3 %
FEV1FVC-%Pred-Pre: 102 %
FEV6-%Change-Post: 3 %
FEV6-%Pred-Post: 81 %
FEV6-%Pred-Pre: 78 %
FEV6-Post: 2.14 L
FEV6-Pre: 2.06 L
FEV6FVC-%Pred-Post: 105 %
FEV6FVC-%Pred-Pre: 105 %
FVC-%Change-Post: 2 %
FVC-%Pred-Post: 77 %
FVC-%Pred-Pre: 75 %
FVC-Post: 2.14 L
FVC-Pre: 2.09 L
Post FEV1/FVC ratio: 77 %
Post FEV6/FVC ratio: 100 %
Pre FEV1/FVC ratio: 75 %
Pre FEV6/FVC Ratio: 100 %
RV % pred: 100 %
RV: 2.55 L
TLC % pred: 90 %
TLC: 4.83 L

## 2023-06-08 MED ORDER — ALBUTEROL SULFATE (2.5 MG/3ML) 0.083% IN NEBU
2.5000 mg | INHALATION_SOLUTION | Freq: Once | RESPIRATORY_TRACT | Status: AC
  Administered 2023-06-08: 2.5 mg via RESPIRATORY_TRACT
  Filled 2023-06-08: qty 3

## 2023-06-13 ENCOUNTER — Ambulatory Visit: Payer: PPO | Admitting: Nurse Practitioner

## 2023-06-20 ENCOUNTER — Telehealth: Payer: Self-pay | Admitting: Internal Medicine

## 2023-06-20 NOTE — Telephone Encounter (Signed)
Inbound call from patient, states she recently threw up blood. She is urgently requesting to speak to a nurse. She states it was due to her throwing up frequently. Please advise.

## 2023-06-20 NOTE — Telephone Encounter (Signed)
Sorry to hear this. She just had an EGD about a month ago with Dr. Leone Payor. She had erosive esophagitis and that is likely what is causing the bleeding. If only small / scant blood please reassure her. If significant bleeding then she would need to go to the ED.  She should be on protonix 40mg  BID and carafate. If she is not doing that please let her know to do that. If she has been doing that, we may need to switch her protonix to Voquezna if it is covered. Not sure if we have samples in the office to give her, 20mg  / day for one week trial? She should be taking Zofran as well, if not she can take that for nausea. If she is we can consider an alternative antiemetic.  She should be seen in the office to discuss these issues and longer term plan - not sure if anything open with me or APP in 7 day hold spots? Thanks

## 2023-06-20 NOTE — Telephone Encounter (Signed)
Left message for pt to call back  °

## 2023-06-20 NOTE — Telephone Encounter (Signed)
Pt stated that she is still having ongoing diarrhea and nausea and vomiting. Pt stated that this is not nothing new and that she was having this prior to her EGD on 05/16/2023/ Pt stated that she through up a lot today  and the last time there was a very small amount of BRB  mixed in with the dry heaves and sputum. Last BM today which was diarrhea.   Please review and advise

## 2023-06-21 NOTE — Telephone Encounter (Signed)
Pt made aware of Dr. Adela Lank recommendations. Pt was scheduled for an office visit to see Boone Master PA on 06/22/2023 at 9:30 AM. Pt made aware.  Pt stated that she would like for the Dr to be made aware that when she does have a BM sometimes that it is stringy and floats. Pt verbalized understanding with all questions answered.

## 2023-06-22 ENCOUNTER — Ambulatory Visit: Payer: PPO | Admitting: Gastroenterology

## 2023-06-22 ENCOUNTER — Other Ambulatory Visit: Payer: Self-pay

## 2023-06-22 ENCOUNTER — Encounter: Payer: Self-pay | Admitting: Gastroenterology

## 2023-06-22 ENCOUNTER — Other Ambulatory Visit: Payer: Self-pay | Admitting: Gastroenterology

## 2023-06-22 VITALS — BP 126/80 | HR 66 | Ht 66.0 in | Wt 184.0 lb

## 2023-06-22 DIAGNOSIS — K909 Intestinal malabsorption, unspecified: Secondary | ICD-10-CM

## 2023-06-22 DIAGNOSIS — K209 Esophagitis, unspecified without bleeding: Secondary | ICD-10-CM

## 2023-06-22 DIAGNOSIS — K5790 Diverticulosis of intestine, part unspecified, without perforation or abscess without bleeding: Secondary | ICD-10-CM | POA: Diagnosis not present

## 2023-06-22 DIAGNOSIS — K2210 Ulcer of esophagus without bleeding: Secondary | ICD-10-CM

## 2023-06-22 DIAGNOSIS — R350 Frequency of micturition: Secondary | ICD-10-CM | POA: Diagnosis not present

## 2023-06-22 DIAGNOSIS — K529 Noninfective gastroenteritis and colitis, unspecified: Secondary | ICD-10-CM | POA: Diagnosis not present

## 2023-06-22 MED ORDER — VOQUEZNA 20 MG PO TABS: 20.0000 mg | ORAL_TABLET | Freq: Every day | ORAL | 0 refills | Status: AC

## 2023-06-22 MED ORDER — VOQUEZNA 20 MG PO TABS: 20.0000 mg | ORAL_TABLET | Freq: Every day | ORAL | 0 refills | Status: DC

## 2023-06-22 NOTE — Progress Notes (Signed)
Chief Complaint: Upper GI symptoms and chronic diarrhea/constipation Primary GI MD: Dr. Adela Lank  HPI: 82 year old female s/p cholecystectomy and others as listed below presents for follow-up.  Last seen 04/26/2023 History of chronic diarrhea with negative celiac testing.  Previously on colestipol 1G twice daily.  She also has had urinary incontinence and frequency and was recommended to go to pelvic floor physical therapy which she feels she is too busy with all of her other doctors appointments to pursue this.  At last appointment she was having increasing GERD and an episode of hematemesis.  She underwent EGD which showed erosive esophagitis.  She is on PPI twice daily and Carafate 4 times daily.  She states she took her Carafate 4 times daily for over a month and then stopped the medication and just takes it as needed.  She is still having significant GERD symptoms. Despite the medication, the patient experienced an episode of vomiting blood on December 3rd. She describes a persistent burning sensation in the esophagus, which has been severe on a few occasions in the recent past.  The patient also reports frequent diarrhea and urinary symptoms. She describes having accidents due to sudden bowel movements, particularly when she is preparing to leave the house or go to church.  She notes her stool has been thin and ribbonlike lately which is worrisome for her.  The patient also notes that she often defecates a little each time she urinates.  The patient has a history of metastatic melanoma, pancreatitis, and diverticulosis. She also reports dizziness and lightheadedness, for which she has an upcoming appointment with an ENT specialist. The patient has been experiencing significant stress due to multiple health issues and frequent doctor visits. She expresses concern about the long-standing nature of her gastrointestinal symptoms and has considered seeking a second opinion.      She states  she has a history of acute pancreatitis a few years ago and she is worried she has pancreatic cancer.  She did have a CT abdomen pelvis May 2024 which showed no pancreatic abnormality.  She also questions possible pancreatic insufficiency as she has noted sometimes her stools float and are greasy/oily.   PREVIOUS GI WORKUP   History of colonoscopy in 2014 by [facility] with having to be admitted to the hospital for pain after procedure and concern for perforation. She was told she should never have a colonoscopy again.   EGD 2018 for hematemesis - Esophagogastric landmarks identified.  - 3 cm hiatal hernia.  - Multiple plaques in the proximal esophagus and in the mid esophagus, suspicious for esophageal candidiasis. Brushings performed.  - No evidence of Barrett' s or esophagitis  - Normal stomach.  - Normal duodenal bulb and second portion of the duodenum.   CT scan 07/26/2017 IMPRESSION: 1. No acute process in the abdomen or pelvis. No evidence of metastatic disease in the abdomen or pelvis. 2.  Aortic Atherosclerosis (ICD10-I70.0). 3. Pelvic floor laxity. 4. Hiatal hernia.     Virtual colonoscopy 02/25/2020 - IMPRESSION: 1. Suboptimal evaluation of the sigmoid, secondary to underdistention. No circumferential mass within this region. More proximally, no evidence of clinically significant colonic polyp or mass. 2. Small hiatal hernia. 3. Aortic Atherosclerosis (ICD10-I70.0).     CT renal stone study 08/18/22: IMPRESSION: Extensive colonic diverticulosis without evidence of diverticulitis.  Moderate-sized hiatal hernia. Umbilical hernia containing fat. No acute intra-abdominal or intrapelvic abnormalities. Aortic Atherosclerosis (ICD10-I70.0).     CT abdomen / pelvis 11/27/22: IMPRESSION: 1. No acute localizing process  in the abdomen or pelvis. 2. Colonic diverticulosis without evidence for diverticulitis. 3. Moderate-sized hiatal hernia.  EGD 05/16/2023 for hematemesis and  GERD - LA Grade B reflux esophagitis with no bleeding. Biopsied.  - 4 cm hiatal hernia. 31- 35 cm  - Gastritis focal area posterior wall of antrum. Biopsied.  - Gastroesophageal flap valve classified as Hill Grade II ( fold present, opens with respiration) .  - The examination was otherwise normal. She did have transient O2 desaturation w/ o complications.  Past Medical History:  Diagnosis Date   Acid reflux    Allergy    Anemia    Anxiety    in past   Arthritis    all over   Asthma    Atypical moles    Bronchitis    Bronchospasm    reactive with perfume/cologne and smoke   CAD (coronary artery disease)    patient denies on preop of 10/24    Candida esophagitis (HCC)    Carotid artery calcification, bilateral 04/2016   Carpal tunnel syndrome of left wrist 12/03/2019   Cataract    Cervical stenosis of spine    Chronic insomnia    Complication of anesthesia    shoulder surgery- not all the way asleep   COVID-19 02/04/2021   also 10/3 or 04/20/21 sxs started 04/05/21   Depression    in past   Diverticulitis    Diverticulosis    Dizziness    Elevated liver enzymes    Family history of adverse reaction to anesthesia    sister- has problems with nausea and vomiting    Fatigue    Fatty liver    Generalized headache    migraines   GERD (gastroesophageal reflux disease)    Gross hematuria 08/18/2022   Hereditary and idiopathic peripheral neuropathy 05/11/2015   History of chemotherapy    topical cream for h/o skin CA   History of kidney stones    History of neuropathy    FH Dr. Allena Katz neurology   History of vertigo    Hx of migraines    Hyperlipidemia    Hypothyroidism    Insomnia    Irritable bowel syndrome    LLQ abdominal pain    Melanoma (HCC) 2013   Melanoma (HCC)    x2 stg II melanoma   Neuropathy, peripheral    Peripheral neuropathy    Pneumonia    hx of x 2   PONV (postoperative nausea and vomiting)    Pre-diabetes    Retina disorder    left   SCC  (squamous cell carcinoma)    skin    Skin cancer 07/17/2019   SCC left leg s/p mohs   SCC 05/01/18 left medial mid leg, right post mid leg 06/08/18, left mid well 04/10/19  Dr. Emily Filbert derm, Dr. Glennis Brink mohs       Stroke [facility])    ? TIA per Dr V per patient    Thyroid disease    Urine incontinence    Viral URI with cough 06/09/2022   Vitamin D deficiency    Weight gain     Past Surgical History:  Procedure Laterality Date   ABLATION Bilateral    Dr. Wyn Quaker 12/2020 veins laser ablation of both great saphenous veins in a staged fashion.   BILATERAL SALPINGOOPHORECTOMY     ovarian cyst Dr. Dareen Piano and Dr. Allie Dimmer 2008   BREAST SURGERY     in 2014 bx breast calcifications   BREAST SURGERY  augmentation with saline Dr. Karmen Bongo 1995    CHOLECYSTECTOMY N/A 01/28/2015   Procedure: LAPAROSCOPIC CHOLECYSTECTOMY WITH INTRAOPERATIVE CHOLANGIOGRAM;  Surgeon: Chevis Pretty III, MD;  Location: [facility] OR;  Service: General;  Laterality: N/A;   CLOSED MANIPULATION SHOULDER     rt   COLONOSCOPY WITH PROPOFOL N/A 11/20/2012   Procedure: COLONOSCOPY WITH PROPOFOL;  Surgeon: Charolett Bumpers, MD;  Location: [facility] ENDOSCOPY;  Service: Endoscopy;  Laterality: N/A;   colonscopy     May 2014   JOINT REPLACEMENT     RTK   KNEE ARTHROSCOPY     left knee miniscus tear repair     MELANOMA EXCISION WITH SENTINEL LYMPH NODE BIOPSY  03/18/2011   Back, nodes neg   OOPHORECTOMY     ROTATOR CUFF REPAIR     left   SHOULDER SURGERY     right and left    SKIN CANCER EXCISION     multiple   TOTAL HIP ARTHROPLASTY     TOTAL KNEE ARTHROPLASTY     right 2010 and left in 2018    TOTAL KNEE ARTHROPLASTY Left 05/16/2017   Procedure: LEFT TOTAL KNEE ARTHROPLASTY;  Surgeon: Durene Romans, MD;  Location: [facility] ORS;  Service: Orthopedics;  Laterality: Left;  70 mins   TUBAL LIGATION     TUMOR REMOVAL     ovary    Current Outpatient Medications  Medication Sig Dispense Refill   albuterol (PROAIR HFA) 108 (90 Base)  MCG/ACT inhaler Inhale 1-2 puffs into the lungs every 6 (six) hours as needed for wheezing or shortness of breath. 18 g 11   aspirin 81 MG tablet TAKE 1 TABLET BY MOUTH EVERY DAY     atorvastatin (LIPITOR) 20 MG tablet Take 1 tablet (20 mg total) by mouth daily. 90 tablet 3   Biotin 1000 MCG tablet Take 1,000 mcg by mouth daily.     BREO ELLIPTA 100-25 MCG/ACT AEPB Inhale 1 puff into the lungs daily. 28 each 11   Calcium 600-200 MG-UNIT tablet Take 1 tablet daily by mouth.     Cholecalciferol (VITAMIN D3) 2000 UNITS capsule Take 2,000 Units by mouth daily.     cholestyramine (QUESTRAN) 4 g packet Take 1 packet (4 g total) by mouth 2 (two) times daily as needed. 60 each 1   citalopram (CELEXA) 20 MG tablet Take 1 tablet (20 mg total) by mouth daily. 90 tablet 3   FOLIC ACID PO Take by mouth daily.     levothyroxine (SYNTHROID) 75 MCG tablet Take 1 tablet (75 mcg total) by mouth daily before breakfast. 90 tablet 3   Magnesium 250 MG TABS Take by mouth.     olmesartan (BENICAR) 20 MG tablet Take 0.5 tablets (10 mg total) by mouth in the morning and at bedtime. D/c 5 mg (10 mg bid of 5 mg) 90 tablet 3   Omega-3 Fatty Acids (FISH OIL) 1000 MG CAPS Take 1,000 mg by mouth daily.      ondansetron (ZOFRAN) 4 MG tablet Take 4 mg by mouth every 8 (eight) hours as needed for nausea or vomiting.     Potassium (POTASSIMIN PO) Take by mouth.     Vonoprazan Fumarate (VOQUEZNA) 20 MG TABS Take 20 mg by mouth daily. For 8 weeks 56 tablet 0   sucralfate (CARAFATE) 1 g tablet Take 1 tablet (1 g total) by mouth every 8 (eight) hours as needed. Dissolve 1 tablet (1 gram) into 10 ml warm water and drink as directed. (Patient not taking:  Reported on 06/22/2023) 90 tablet 1   vitamin B-12 (CYANOCOBALAMIN) 1000 MCG tablet Take 2,000 mcg by mouth daily. 2 tabs (Patient not taking: Reported on 06/22/2023)     No current facility-administered medications for this visit.    Allergies as of 06/22/2023 - Review Complete  06/22/2023  Allergen Reaction Noted   Epinephrine Other (See Comments) and Anaphylaxis 02/28/2011   Penicillins Swelling and Other (See Comments) 02/28/2011   Other  05/10/2017   Valium [diazepam] Other (See Comments) 07/01/2014    Family History  Problem Relation Age of Onset   Hypertension Mother    Stroke Mother    Hyperlipidemia Mother    Heart disease Father    Diabetes Father    Hypertension Father    Hyperlipidemia Father    Early death Father    Diabetes Sister    Heart disease Sister        heart murmur   Arthritis Sister    COPD Sister    Hyperlipidemia Sister    Hypertension Sister    Colon polyps Sister        adenomous   Arthritis Sister    Heart disease Sister    Hyperlipidemia Sister    Hypertension Sister    Cancer Sister    Dementia Sister    Tremor Sister    Stroke Maternal Grandmother    Colon cancer Maternal Grandfather    Cancer Maternal Grandfather    Early death Maternal Grandfather    Early death Paternal Grandmother    Stroke Paternal Grandmother    Heart disease Daughter    Hyperlipidemia Daughter    Hypertension Daughter    Heart disease Son    Hyperlipidemia Son    Alcohol abuse Son    Depression Son    Diabetes Son    Pancreatic cancer Maternal Uncle    Alzheimer's disease Paternal Aunt    Alzheimer's disease Paternal Aunt    Alzheimer's disease Paternal Aunt    Alzheimer's disease Paternal Aunt    Cancer Other        2 paternal uncles and 1 maternal uncle colon cancer   Esophageal cancer Neg Hx    Stomach cancer Neg Hx     Social History   Socioeconomic History   Marital status: Divorced    Spouse name: Not on file   Number of children: 3   Years of education: College   Highest education level: Some college, no degree  Occupational History   Occupation: Retired    Associate Professor: RETIRED  Tobacco Use   Smoking status: Never   Smokeless tobacco: Never  Vaping Use   Vaping status: Never Used  Substance and Sexual  Activity   Alcohol use: Yes    Alcohol/week: 1.0 standard drink of alcohol    Types: 1 Cans of beer per week    Comment: once a week beer or wine   Drug use: No   Sexual activity: Not Currently  Other Topics Concern   Not on file  Social History Narrative   Patient lives at home alone in a one story home.   Has 3 children (2 daughters and 1 son)    Retired from [employer].   Divorced since 1992    Caffeine Use: 1 cup daily and a soft drink occasionally    No guns    Wears seat belt     Lives with cat    Left handed   Social Determinants of Corporate investment banker  Strain: Low Risk  (04/18/2023)   Overall Financial Resource Strain (CARDIA)    Difficulty of Paying Living Expenses: Not hard at all  Food Insecurity: No Food Insecurity (04/18/2023)   Hunger Vital Sign    Worried About Running Out of Food in the Last Year: Never true    Ran Out of Food in the Last Year: Never true  Transportation Needs: No Transportation Needs (04/18/2023)   PRAPARE - Administrator, Civil Service (Medical): No    Lack of Transportation (Non-Medical): No  Physical Activity: Inactive (04/18/2023)   Exercise Vital Sign    Days of Exercise per Week: 0 days    Minutes of Exercise per Session: 0 min  Stress: Stress Concern Present (04/18/2023)   [employer] of Occupational Health - Occupational Stress Questionnaire    Feeling of Stress : To some extent  Social Connections: Moderately Integrated (04/18/2023)   Social Connection and Isolation Panel [NHANES]    Frequency of Communication with Friends and Family: More than three times a week    Frequency of Social Gatherings with Friends and Family: Three times a week    Attends Religious Services: More than 4 times per year    Active Member of Clubs or Organizations: Yes    Attends Banker Meetings: More than 4 times per year    Marital Status: Divorced  Intimate Partner Violence: Not At Risk (04/18/2023)    Humiliation, Afraid, Rape, and Kick questionnaire    Fear of Current or Ex-Partner: No    Emotionally Abused: No    Physically Abused: No    Sexually Abused: No    Review of Systems:    Constitutional: No weight loss, fever, chills, weakness or fatigue HEENT: Eyes: No change in vision               Ears, Nose, Throat:  No change in hearing or congestion Skin: No rash or itching Cardiovascular: No chest pain, chest pressure or palpitations   Respiratory: No SOB or cough Gastrointestinal: See HPI and otherwise negative Genitourinary: No dysuria or change in urinary frequency Neurological: No headache, dizziness or syncope Musculoskeletal: No new muscle or joint pain Hematologic: No bleeding or bruising Psychiatric: No history of depression or anxiety    Physical Exam:  Vital signs: BP 126/80   Pulse 66   Ht 5\' 6"  (1.676 m)   Wt 184 lb (83.5 kg)   BMI 29.70 kg/m   Constitutional: NAD, Well developed, Well nourished, alert and cooperative Head:  Normocephalic and atraumatic. Eyes:   PEERL, EOMI. No icterus. Conjunctiva pink. Respiratory: Respirations even and unlabored. Lungs clear to auscultation bilaterally.   No wheezes, crackles, or rhonchi.  Cardiovascular:  Regular rate and rhythm. No peripheral edema, cyanosis or pallor.  Gastrointestinal:  Soft, nondistended, nontender. No rebound or guarding. Normal bowel sounds. No appreciable masses or hepatomegaly. Rectal:  Not performed.  Msk:  Symmetrical without gross deformities. Without edema, no deformity or joint abnormality.  Neurologic:  Alert and  oriented x4;  grossly normal neurologically.  Skin:   Dry and intact without significant lesions or rashes. Psychiatric: Oriented to person, place and time. Demonstrates good judgement and reason without abnormal affect or behaviors.  RELEVANT LABS AND IMAGING: CBC    Component Value Date/Time   WBC 5.6 04/24/2023 0837   WBC 6.2 03/21/2023 1513   RBC 4.31 04/24/2023 0837    RBC 4.40 03/21/2023 1513   HGB 13.0 04/24/2023 0837   HGB 14.9 04/07/2016  1157   HCT 40.2 04/24/2023 0837   HCT 45.7 04/07/2016 1157   PLT 197 04/24/2023 0837   MCV 93 04/24/2023 0837   MCV 91.1 04/07/2016 1157   MCH 30.2 04/24/2023 0837   MCH 29.8 03/21/2023 1513   MCHC 32.3 04/24/2023 0837   MCHC 32.2 03/21/2023 1513   RDW 12.9 04/24/2023 0837   RDW 13.9 04/07/2016 1157   LYMPHSABS 1.8 04/24/2023 0837   LYMPHSABS 1.8 04/07/2016 1157   MONOABS 0.6 08/22/2022 1442   MONOABS 0.6 04/07/2016 1157   EOSABS 0.2 04/24/2023 0837   BASOSABS 0.0 04/24/2023 0837   BASOSABS 0.0 04/07/2016 1157    CMP     Component Value Date/Time   NA 145 (H) 04/24/2023 0837   NA 143 04/07/2016 1158   K 4.5 04/24/2023 0837   K 4.0 04/07/2016 1158   CL 108 (H) 04/24/2023 0837   CL 113 (H) 05/22/2012 1004   CO2 21 04/24/2023 0837   CO2 21 (L) 04/07/2016 1158   GLUCOSE 108 (H) 04/24/2023 0837   GLUCOSE 98 03/21/2023 1513   GLUCOSE 103 04/07/2016 1158   GLUCOSE 111 (H) 05/22/2012 1004   BUN 17 04/24/2023 0837   BUN 23.0 04/07/2016 1158   CREATININE 1.11 (H) 04/24/2023 0837   CREATININE 1.10 (H) 04/14/2020 0826   CREATININE 0.9 04/07/2016 1158   CALCIUM 9.4 04/24/2023 0837   CALCIUM 9.3 04/07/2016 1158   PROT 6.0 04/24/2023 0837   PROT 7.1 04/07/2016 1158   ALBUMIN 4.1 04/24/2023 0837   ALBUMIN 3.9 04/07/2016 1158   AST 16 04/24/2023 0837   AST 12 04/07/2016 1158   ALT 12 04/24/2023 0837   ALT 16 04/07/2016 1158   ALKPHOS 69 04/24/2023 0837   ALKPHOS 79 04/07/2016 1158   BILITOT 1.6 (H) 04/24/2023 0837   BILITOT 1.40 (H) 04/07/2016 1158   GFRNONAA 58 (L) 03/21/2023 1513   GFRAA >60 05/31/2019 1251     Assessment/Plan:      Erosive Esophagitis Severe inflammation of the esophagus. Currently on Pantoprazole and Carafate with persistent symptoms. Discussed the importance of consistent Carafate use and the potential benefit of Voquenza. -Continue Pantoprazole and Carafate. -Increase  Carafate to every 6 hours for 30 days. -Initiate Voquenza pending insurance approval and if approved can stop PPI and carafate and take Voquenza 20 Mg once daily for 8 weeks  Chronic Diarrhea Frequent bowel movements with occasional incontinence. Discussed the potential benefit of fiber supplementation and pelvic floor physical therapy.  Suspect patient may have stress incontinence as this occurs more frequently when she is scheduled to go somewhere.  She also is concerned about thin stools --Start daily fiber supplementation. --Greatly stressed the importance of undergoing pelvic floor  physical therapy as I feel this would greatly improve her symptoms --Order stool test for pancreatic insufficiency. -- Can consider dicyclomine  Urinary Frequency No signs of UTI. Discussed the potential benefit of pelvic floor physical therapy. -Consider pelvic floor physical therapy.  Diverticulosis Discussed the potential benefit of fiber supplementation. -Start daily fiber supplementation.  General Health Maintenance / Followup Plans -Call in 2 weeks to report on the effectiveness of the fiber supplementation. -Follow-up 3 months     Jammy Plotkin Jolee Ewing [facility] 06/22/2023, 11:27 AM  Cc: Margaretann Loveless, MD

## 2023-06-22 NOTE — Patient Instructions (Signed)
We have sent the following medications to your pharmacy for you to pick up at your convenience: Voquezna 20 mg: Take daily for 8 weeks   Please follow up in 2 to 3 months.  Thank you for trusting me with your gastrointestinal care!   Boone Master, PA   If your blood pressure at your visit was 140/90 or greater, please contact your primary care physician to follow up on this. ______________________________________________________  If you are age 82 or older, your body mass index should be between 23-30. Your Body mass index is 29.7 kg/m. If this is out of the aforementioned range listed, please consider follow up with your Primary Care Provider.  If you are age 53 or younger, your body mass index should be between 19-25. Your Body mass index is 29.7 kg/m. If this is out of the aformentioned range listed, please consider follow up with your Primary Care Provider.  ________________________________________________________  The [facility] providers would like to encourage you to use [facility] to communicate with providers for non-urgent requests or questions.  Due to long hold times on the telephone, sending your provider a message by [facility] may be a faster and more efficient way to get a response.  Please allow 48 business hours for a response.  Please remember that this is for non-urgent requests.  _______________________________________________________  Due to recent changes in healthcare laws, you may see the results of your imaging and laboratory studies on MyChart before your provider has had a chance to review them.  We understand that in some cases there may be results that are confusing or concerning to you. Not all laboratory results come back in the same time frame and the provider may be waiting for multiple results in order to interpret others.  Please give [name] 48 hours in order for your provider to thoroughly review all the results before contacting the office for clarification of  your results.

## 2023-06-22 NOTE — Progress Notes (Signed)
Agree. If samples in the office we could give a free sample of Voquezna, but think that is next best step to treat her GERD. Okay to use carafate if it helps, however need to be careful with long term / high dosing of Carafate, can worsen CKD, but she has normal renal function. Thanks

## 2023-06-22 NOTE — Progress Notes (Signed)
Refill for Carafate sent to pharm

## 2023-06-23 ENCOUNTER — Encounter: Payer: Self-pay | Admitting: Nurse Practitioner

## 2023-06-23 ENCOUNTER — Ambulatory Visit: Payer: PPO | Admitting: Nurse Practitioner

## 2023-06-23 VITALS — BP 110/62 | HR 70 | Temp 97.1°F | Ht 66.0 in | Wt 184.0 lb

## 2023-06-23 DIAGNOSIS — J4530 Mild persistent asthma, uncomplicated: Secondary | ICD-10-CM | POA: Diagnosis not present

## 2023-06-23 DIAGNOSIS — K219 Gastro-esophageal reflux disease without esophagitis: Secondary | ICD-10-CM

## 2023-06-23 NOTE — Progress Notes (Signed)
@Patient  ID: [name], female    DOB: December 23, 1940, 82 y.o.   MRN: [id number]  Chief Complaint  Patient presents with   Follow-up    DOE. No wheezing. Cough with clear sputum.    Referring provider: Dana Allan, MD  HPI: 82 year old female, never smoker followed for reactive airway dysfunction syndrome.  She is patient Dr. Jayme Cloud last seen in office 10/03/2022.  Past medical history significant for CVA, HTN, migraine, hiatal hernia with GERD, hypothyroid, anxiety, depression, HLD, obesity, prediabetes.   TEST/EVENTS:  03/21/2023 CXR: clear lungs 06/08/2023 PFT: FVC 75, FEV1 75, ratio 77, TLC 90, DLCOunc 81.   10/03/2022: OV with Dr. Jayme Cloud. Recurrent cough. Initially noted December 2019. Had COVID July 2022. Noticed dyspnea post COVID. Having more difficulties than last time she was seen. Cough seems worse with reflux. Has been noted to have mild reactive airways dysfunction syndrome. Cough non-productive. GERD controlled with PPI. FeNO slightly elevated at 28 ppb. Likely mild persistent asthma. Reassess with PFT. Ambulatory oximetry reassuring. Trial Breo.   06/23/2023: Today - follow up Discussed the use of AI scribe software for clinical note transcription with the patient, who gave verbal consent to proceed.  History of Present Illness   The patient, with a history of asthma, presents for a follow-up visit. She reports an improvement in her breathing since starting Breo. However, she still experiences occasional shortness of breath, particularly during physical activities such as walking her dog or shopping. She uses her albuterol rescue inhaler a few times a week, which helps her breathing but causes her to feel shaky. She denies any recent wheezing, chest tightness, or bothersome cough, and she has not been waking up short of breath at night.  In addition to her respiratory symptoms, she also reports ongoing gastrointestinal issues, including frequent diarrhea, which she feels  impacts her daily activities more than her breathing. She has seen a gastroenterologist for these symptoms.       Allergies  Allergen Reactions   Epinephrine Other (See Comments) and Anaphylaxis    Heart palpitations   Penicillins Swelling and Other (See Comments)    Lip swelling Has patient had a PCN reaction causing immediate rash, facial/tongue/throat swelling, SOB or lightheadedness with hypotension: Yes Has patient had a PCN reaction causing severe rash involving mucus membranes or skin necrosis: No Has patient had a PCN reaction that required hospitalization No Has patient had a PCN reaction occurring within the last 10 years: No If all of the above answers are "NO", then may proceed with Cephalosporin use.   Other     Black pepper- caused bronchial tubes to start closing    Valium [Diazepam] Other (See Comments)    Other reaction(s): Other (See Comments) Overly sensitive  sedation    Immunization History  Administered Date(s) Administered   Fluad Quad(high Dose 65+) 04/25/2019, 04/14/2020, 06/24/2021, 04/28/2023   Influenza Split 04/18/2017   Influenza, High Dose Seasonal PF 04/18/2017, 04/12/2018   Influenza, Quadrivalent, Recombinant, Inj, Pf 05/03/2018   Influenza,inj,Quad PF,6+ Mos 04/21/2022   Influenza-Unspecified 05/03/2018   Moderna Covid-19 Fall Seasonal Vaccine 25yrs & older 04/04/2023   PFIZER Comirnaty(Gray Top)Covid-19 Tri-Sucrose Vaccine 12/03/2020   PFIZER(Purple Top)SARS-COV-2 Vaccination 08/12/2019, 09/02/2019, 03/16/2020   Pfizer Covid-19 Vaccine Bivalent Booster 15yrs & up 08/09/2021   Pneumococcal Conjugate-13 05/15/2014   Pneumococcal Polysaccharide-23 12/16/2005, 05/18/2016   Td 10/18/2012   Zoster Recombinant(Shingrix) 06/16/2011    Past Medical History:  Diagnosis Date   Acid reflux    Allergy  Anemia    Anxiety    in past   Arthritis    all over   Asthma    Atypical moles    Bronchitis    Bronchospasm    reactive with  perfume/cologne and smoke   CAD (coronary artery disease)    patient denies on preop of 10/24    Candida esophagitis (HCC)    Carotid artery calcification, bilateral 04/2016   Carpal tunnel syndrome of left wrist 12/03/2019   Cataract    Cervical stenosis of spine    Chronic insomnia    Complication of anesthesia    shoulder surgery- not all the way asleep   COVID-19 02/04/2021   also 10/3 or 04/20/21 sxs started 04/05/21   Depression    in past   Diverticulitis    Diverticulosis    Dizziness    Elevated liver enzymes    Family history of adverse reaction to anesthesia    sister- has problems with nausea and vomiting    Fatigue    Fatty liver    Generalized headache    migraines   GERD (gastroesophageal reflux disease)    Gross hematuria 08/18/2022   Hereditary and idiopathic peripheral neuropathy 05/11/2015   History of chemotherapy    topical cream for h/o skin CA   History of kidney stones    History of neuropathy    FH Dr. Allena Katz neurology   History of vertigo    Hx of migraines    Hyperlipidemia    Hypothyroidism    Insomnia    Irritable bowel syndrome    LLQ abdominal pain    Melanoma (HCC) 2013   Melanoma (HCC)    x2 stg II melanoma   Neuropathy, peripheral    Peripheral neuropathy    Pneumonia    hx of x 2   PONV (postoperative nausea and vomiting)    Pre-diabetes    Retina disorder    left   SCC (squamous cell carcinoma)    skin    Skin cancer 07/17/2019   SCC left leg s/p mohs   SCC 05/01/18 left medial mid leg, right post mid leg 06/08/18, left mid well 04/10/19  Dr. Emily Filbert derm, Dr. Glennis Brink mohs       Stroke [facility])    ? TIA per Dr V per patient    Thyroid disease    Urine incontinence    Viral URI with cough 06/09/2022   Vitamin D deficiency    Weight gain     Tobacco History: Social History   Tobacco Use  Smoking Status Never  Smokeless Tobacco Never   Counseling given: Not Answered   Outpatient Medications Prior to Visit   Medication Sig Dispense Refill   albuterol (PROAIR HFA) 108 (90 Base) MCG/ACT inhaler Inhale 1-2 puffs into the lungs every 6 (six) hours as needed for wheezing or shortness of breath. 18 g 11   aspirin 81 MG tablet TAKE 1 TABLET BY MOUTH EVERY DAY     atorvastatin (LIPITOR) 20 MG tablet Take 1 tablet (20 mg total) by mouth daily. 90 tablet 3   Biotin 1000 MCG tablet Take 1,000 mcg by mouth daily.     BREO ELLIPTA 100-25 MCG/ACT AEPB Inhale 1 puff into the lungs daily. 28 each 11   Calcium 600-200 MG-UNIT tablet Take 1 tablet daily by mouth.     Cholecalciferol (VITAMIN D3) 2000 UNITS capsule Take 2,000 Units by mouth daily.     cholestyramine (QUESTRAN) 4 g packet Take  1 packet (4 g total) by mouth 2 (two) times daily as needed. 60 each 1   citalopram (CELEXA) 20 MG tablet Take 1 tablet (20 mg total) by mouth daily. 90 tablet 3   FOLIC ACID PO Take by mouth daily.     levothyroxine (SYNTHROID) 75 MCG tablet Take 1 tablet (75 mcg total) by mouth daily before breakfast. 90 tablet 3   Magnesium 250 MG TABS Take by mouth.     olmesartan (BENICAR) 20 MG tablet Take 0.5 tablets (10 mg total) by mouth in the morning and at bedtime. D/c 5 mg (10 mg bid of 5 mg) 90 tablet 3   Omega-3 Fatty Acids (FISH OIL) 1000 MG CAPS Take 1,000 mg by mouth daily.      ondansetron (ZOFRAN) 4 MG tablet Take 4 mg by mouth every 8 (eight) hours as needed for nausea or vomiting.     Potassium (POTASSIMIN PO) Take by mouth.     sucralfate (CARAFATE) 1 g tablet Take 1 tablet (1 g total) by mouth every 8 (eight) hours as needed. Slowly dissolve tablet in 1 tablespoon of distilled water before ingesting 60 tablet 3   vitamin B-12 (CYANOCOBALAMIN) 1000 MCG tablet Take 2,000 mcg by mouth daily. 2 tabs     Vonoprazan Fumarate (VOQUEZNA) 20 MG TABS Take 20 mg by mouth daily. For 8 weeks 56 tablet 0   No facility-administered medications prior to visit.     Review of Systems:   Constitutional: No weight loss or gain, night  sweats, fevers, chills, or lassitude. +baseline HEENT: No headaches, difficulty swallowing, tooth/dental problems, or sore throat. No sneezing, itching, ear ache, nasal congestion, or post nasal drip CV:  No chest pain, orthopnea, PND, swelling in lower extremities, anasarca, dizziness, palpitations, syncope Resp: +improved shortness of breath with exertion; rare cough. No excess mucus or change in color of mucus. No hemoptysis. No wheezing.  No chest wall deformity GI:  +heartburn, indigestion, occasional abdominal pain, nausea, vomiting, diarrhea (all chronic symptoms). No change in bowel habits, loss of appetite, bloody stools.  GU: No dysuria, change in color of urine, urgency or frequency.  No flank pain, no hematuria  Skin: No rash, lesions, ulcerations MSK:  No joint pain or swelling.   Neuro: No dizziness or lightheadedness.  Psych: No depression or anxiety. Mood stable.     Physical Exam:  BP 110/62 (BP Location: Right Arm, Cuff Size: Normal)   Pulse 70   Temp (!) 97.1 F (36.2 C)   Ht 5\' 6"  (1.676 m)   Wt 184 lb (83.5 kg)   SpO2 96%   BMI 29.70 kg/m   GEN: Pleasant, interactive, well-appearing; in no acute distress HEENT:  Normocephalic and atraumatic. PERRLA. Sclera white. Nasal turbinates pink, moist and patent bilaterally. No rhinorrhea present. Oropharynx pink and moist, without exudate or edema. No lesions, ulcerations, or postnasal drip.  NECK:  Supple w/ fair ROM. No JVD present. Normal carotid impulses w/o bruits. Thyroid symmetrical with no goiter or nodules palpated. No lymphadenopathy.   CV: RRR, no m/r/g, no peripheral edema. Pulses intact, +2 bilaterally. No cyanosis, pallor or clubbing. PULMONARY:  Unlabored, regular breathing. Clear bilaterally A&P w/o wheezes/rales/rhonchi. No accessory muscle use.  GI: BS present and normoactive. Soft, non-tender to palpation. No organomegaly or masses detected.  MSK: No erythema, warmth or tenderness. Cap refil <2 sec all  extrem. No deformities or joint swelling noted.  Neuro: A/Ox3. No focal deficits noted.   Skin: Warm, no lesions or rashe  Psych: Normal affect and behavior. Judgement and thought content appropriate.     Lab Results:  CBC    Component Value Date/Time   WBC 5.6 04/24/2023 0837   WBC 6.2 03/21/2023 1513   RBC 4.31 04/24/2023 0837   RBC 4.40 03/21/2023 1513   HGB 13.0 04/24/2023 0837   HGB 14.9 04/07/2016 1157   HCT 40.2 04/24/2023 0837   HCT 45.7 04/07/2016 1157   PLT 197 04/24/2023 0837   MCV 93 04/24/2023 0837   MCV 91.1 04/07/2016 1157   MCH 30.2 04/24/2023 0837   MCH 29.8 03/21/2023 1513   MCHC 32.3 04/24/2023 0837   MCHC 32.2 03/21/2023 1513   RDW 12.9 04/24/2023 0837   RDW 13.9 04/07/2016 1157   LYMPHSABS 1.8 04/24/2023 0837   LYMPHSABS 1.8 04/07/2016 1157   MONOABS 0.6 08/22/2022 1442   MONOABS 0.6 04/07/2016 1157   EOSABS 0.2 04/24/2023 0837   BASOSABS 0.0 04/24/2023 0837   BASOSABS 0.0 04/07/2016 1157    BMET    Component Value Date/Time   NA 145 (H) 04/24/2023 0837   NA 143 04/07/2016 1158   K 4.5 04/24/2023 0837   K 4.0 04/07/2016 1158   CL 108 (H) 04/24/2023 0837   CL 113 (H) 05/22/2012 1004   CO2 21 04/24/2023 0837   CO2 21 (L) 04/07/2016 1158   GLUCOSE 108 (H) 04/24/2023 0837   GLUCOSE 98 03/21/2023 1513   GLUCOSE 103 04/07/2016 1158   GLUCOSE 111 (H) 05/22/2012 1004   BUN 17 04/24/2023 0837   BUN 23.0 04/07/2016 1158   CREATININE 1.11 (H) 04/24/2023 0837   CREATININE 1.10 (H) 04/14/2020 0826   CREATININE 0.9 04/07/2016 1158   CALCIUM 9.4 04/24/2023 0837   CALCIUM 9.3 04/07/2016 1158   GFRNONAA 58 (L) 03/21/2023 1513   GFRAA >60 05/31/2019 1251    BNP    Component Value Date/Time   BNP 73.0 10/02/2018 1735     Imaging:  VAS [name] LOWER EXTREMITY VENOUS (DVT)  Result Date: 06/05/2023  Lower Venous DVT Study Patient Name:  [name]  Date of Exam:   06/02/2023 Medical Rec #: [id number]       Accession #:    [id number] Date of  Birth: 24-Aug-1940       Patient Gender: F Patient Age:   72 years Exam Location:  [facility] Procedure:      VAS [name] LOWER EXTREMITY VENOUS (DVT) Referring Phys: Vivia Birmingham BROWN --------------------------------------------------------------------------------  Indications: F/U Lt LE DVT.  Performing Technologist: Debbe Bales RVS  Examination Guidelines: A complete evaluation includes B-mode imaging, spectral Doppler, color Doppler, and power Doppler as needed of all accessible portions of each vessel. Bilateral testing is considered an integral part of a complete examination. Limited examinations for reoccurring indications may be performed as noted. The reflux portion of the exam is performed with the patient in reverse Trendelenburg.  +---------+---------------+---------+-----------+----------+--------------+ LEFT     CompressibilityPhasicitySpontaneityPropertiesThrombus Aging +---------+---------------+---------+-----------+----------+--------------+ CFV      Full           Yes      Yes                                 +---------+---------------+---------+-----------+----------+--------------+ SFJ      Full           Yes      Yes                                 +---------+---------------+---------+-----------+----------+--------------+  FV Prox  Full           Yes      Yes                                 +---------+---------------+---------+-----------+----------+--------------+ FV Mid   Full           Yes      Yes                                 +---------+---------------+---------+-----------+----------+--------------+ FV DistalFull           Yes      Yes                                 +---------+---------------+---------+-----------+----------+--------------+ PFV      Full           Yes      Yes                                 +---------+---------------+---------+-----------+----------+--------------+ POP      Full           Yes      Yes                                  +---------+---------------+---------+-----------+----------+--------------+ PTV      Full           Yes      Yes                                 +---------+---------------+---------+-----------+----------+--------------+ PERO     Full           Yes      Yes                                 +---------+---------------+---------+-----------+----------+--------------+ GSV      Full           Yes      Yes                                 +---------+---------------+---------+-----------+----------+--------------+     Summary: LEFT: - No evidence of deep vein thrombosis in the lower extremity. No indirect evidence of obstruction proximal to the inguinal ligament.  - There is no evidence of deep vein thrombosis in the lower extremity. - There is no evidence of deep vein thrombosis proximal to the inguinal ligament or in the common femoral vein. - There is no evidence of deep vein thrombosis in the lower extremity. However, portions of this examination were limited- see technologist comments above. - There is no evidence of superficial venous thrombosis.  *See table(s) above for measurements and observations. Electronically signed by Festus Barren MD on 06/05/2023 at 9:10:37 AM.    Final     albuterol (PROVENTIL) (2.5 MG/3ML) 0.083% nebulizer solution 2.5 mg     Date Action Dose Route User   06/08/2023 0809 Given 2.5 mg Nebulization Fleet Contras, RRT  Latest Ref Rng & Units 06/08/2023    8:50 AM  PFT Results  FVC-Pre L 2.09   FVC-Predicted Pre % 75   FVC-Post L 2.14   FVC-Predicted Post % 77   Pre FEV1/FVC % % 75   Post FEV1/FCV % % 77   FEV1-Pre L 1.56   FEV1-Predicted Pre % 75   FEV1-Post L 1.66   DLCO uncorrected ml/min/mmHg 16.21   DLCO UNC% % 81   DLVA Predicted % 85   TLC L 4.83   TLC % Predicted % 90   RV % Predicted % 100     Lab Results  Component Value Date   NITRICOXIDE 28 10/03/2022        Assessment & Plan:   Mild  persistent asthma Mild persistent asthma. Clinically improved with ICS/LABA therapy. No recent exacerbations or hospitalizations due to her breathing. She does still have some occasional DOE, which is likely multifactorial related to exercise induced bronchospasm and deconditioning. Advised she trial SABA prior to activities known to cause dyspnea. Work on graded exercises. Action plan in place.  Patient Instructions  -Continue Albuterol inhaler 2 puffs every 6 hours as needed for shortness of breath or wheezing. Notify if symptoms persist despite rescue inhaler/neb use. Try using your albuterol inhaler 1-2 puffs about 10-15 minutes before activities that you know make you short winded, such as walking the dog  -Continue Breo 1 puff daily. Brush tongue and rinse mouth afterwards  Work on graded exercises and increasing activity levels   Continue follow up with your stomach doctor  Your breathing test showed findings consistent with mild asthma. You had a reduction in your lung function with some improvement following the albuterol during the test. You did have normal gas exchange as well as normal lung volumes, which is good.  Follow up in 6 months with Dr. Jayme Cloud. If symptoms do not improve or worsen, please contact office for sooner follow up or seek emergency care.    GERD (gastroesophageal reflux disease) Follow up with GI as scheduled    Advised if symptoms do not improve or worsen, to please contact office for sooner follow up or seek emergency care.   I spent 28 minutes of dedicated to the care of this patient on the date of this encounter to include pre-visit review of records, face-to-face time with the patient discussing conditions above, post visit ordering of testing, clinical documentation with the electronic health record, making appropriate referrals as documented, and communicating necessary findings to members of the patients care team.  Noemi Chapel,  NP 06/23/2023  Pt aware and understands NP's role.

## 2023-06-23 NOTE — Assessment & Plan Note (Signed)
Mild persistent asthma. Clinically improved with ICS/LABA therapy. No recent exacerbations or hospitalizations due to her breathing. She does still have some occasional DOE, which is likely multifactorial related to exercise induced bronchospasm and deconditioning. Advised she trial SABA prior to activities known to cause dyspnea. Work on graded exercises. Action plan in place.  Patient Instructions  -Continue Albuterol inhaler 2 puffs every 6 hours as needed for shortness of breath or wheezing. Notify if symptoms persist despite rescue inhaler/neb use. Try using your albuterol inhaler 1-2 puffs about 10-15 minutes before activities that you know make you short winded, such as walking the dog  -Continue Breo 1 puff daily. Brush tongue and rinse mouth afterwards  Work on graded exercises and increasing activity levels   Continue follow up with your stomach doctor  Your breathing test showed findings consistent with mild asthma. You had a reduction in your lung function with some improvement following the albuterol during the test. You did have normal gas exchange as well as normal lung volumes, which is good.  Follow up in 6 months with Dr. Jayme Cloud. If symptoms do not improve or worsen, please contact office for sooner follow up or seek emergency care.

## 2023-06-23 NOTE — Assessment & Plan Note (Signed)
Follow up with GI as scheduled.

## 2023-06-23 NOTE — Patient Instructions (Addendum)
-  Continue Albuterol inhaler 2 puffs every 6 hours as needed for shortness of breath or wheezing. Notify if symptoms persist despite rescue inhaler/neb use. Try using your albuterol inhaler 1-2 puffs about 10-15 minutes before activities that you know make you short winded, such as walking the dog  -Continue Breo 1 puff daily. Brush tongue and rinse mouth afterwards  Work on graded exercises and increasing activity levels   Continue follow up with your stomach doctor  Your breathing test showed findings consistent with mild asthma. You had a reduction in your lung function with some improvement following the albuterol during the test. You did have normal gas exchange as well as normal lung volumes, which is good.  Follow up in 6 months with [name]. If symptoms do not improve or worsen, please contact office for sooner follow up or seek emergency care.

## 2023-06-24 NOTE — Progress Notes (Signed)
Agree with the details of the visit as noted by Katherine Cobb, NP. ° °C. Laura Yasin Ducat, MD °Advanced Bronchoscopy °P[facility] ° °

## 2023-06-27 ENCOUNTER — Ambulatory Visit: Payer: PPO | Admitting: Internal Medicine

## 2023-06-27 ENCOUNTER — Other Ambulatory Visit: Payer: PPO

## 2023-06-28 ENCOUNTER — Other Ambulatory Visit: Payer: PPO

## 2023-06-28 ENCOUNTER — Telehealth: Payer: Self-pay

## 2023-06-28 ENCOUNTER — Other Ambulatory Visit: Payer: Self-pay

## 2023-06-28 DIAGNOSIS — K909 Intestinal malabsorption, unspecified: Secondary | ICD-10-CM

## 2023-06-28 NOTE — Telephone Encounter (Signed)
Patient called to see if she can go to a lab in [place] to do the pancreatic fecal elastase lab [facility] wanted her to have.  Faxed orders to [facility] in [place] on [address] Fax: [phone]. Confirmation rec'd.  Ph: [phone]   She was also contacted by [facility] and wants to make sure it is OK to respond to them regarding the script for Voquezna.  I offered her Voquezna samples but she won't be in [place] this week. She will let me know if she would like to come by when she is in town for another appointment next week or thereafter.

## 2023-07-03 DIAGNOSIS — K909 Intestinal malabsorption, unspecified: Secondary | ICD-10-CM | POA: Diagnosis not present

## 2023-07-05 DIAGNOSIS — L74519 Primary focal hyperhidrosis, unspecified: Secondary | ICD-10-CM | POA: Diagnosis not present

## 2023-07-05 DIAGNOSIS — R197 Diarrhea, unspecified: Secondary | ICD-10-CM | POA: Diagnosis not present

## 2023-07-05 LAB — PANCREATIC ELASTASE, FECAL: Pancreatic Elastase, Fecal: 553 ug Elast./g (ref >200)

## 2023-07-05 NOTE — Telephone Encounter (Signed)
Left message for patient to call back  

## 2023-07-05 NOTE — Telephone Encounter (Signed)
Patient requesting to speak with a nurse in regards to labs. Please advise.

## 2023-07-06 NOTE — Telephone Encounter (Signed)
Patient calls back stating that although pancreatic elastace test returned normal and she is taking fiber supplementation, she continues to have diarrhea with some incontinence. She asks if she can be tested for stool infections/parasites. Upon further questioning, patient is taking clearlax (generic miralax) daily. She has metamucil on hand but does not take this. We discussed that clearlax is not a fiber supplement but rather an osmotic laxative so this could be making her diarrhea worse. I have asked that she stop the clearlax and instead begin taking the metamucil daily to bulk her stools. I have also asked that she follow through with pelvic floor therapy at least for evaluation (she says we referred her but she hasn't made an appointment yet because she wanted to see what the stool studies showed previously). Patient verbalizes understanding.

## 2023-07-06 NOTE — Telephone Encounter (Signed)
Left message for patient to call back  

## 2023-07-25 ENCOUNTER — Ambulatory Visit: Payer: PPO | Admitting: Podiatry

## 2023-07-27 NOTE — Telephone Encounter (Signed)
 Left message for patient to call back

## 2023-07-27 NOTE — Telephone Encounter (Signed)
 Patient requesting to speak with nurse to discuss symptoms she is having. States " it feels like she is sitting on a heater". Also having pelvic pain. Please advise.

## 2023-07-28 NOTE — Telephone Encounter (Signed)
 Patient calls complaining of feeling like a heater is under her vaginal area. Complains of associated pelvic discomfort as well. Patient confirms that pain/discomfort is not coming from the rectum.   I have advised patient that she should reach out to her primary care physician or gynecologist for further recommendations on vaginal symptoms. She verbalizes understanding.

## 2023-08-08 DIAGNOSIS — L578 Other skin changes due to chronic exposure to nonionizing radiation: Secondary | ICD-10-CM | POA: Diagnosis not present

## 2023-08-08 DIAGNOSIS — D225 Melanocytic nevi of trunk: Secondary | ICD-10-CM | POA: Diagnosis not present

## 2023-08-08 DIAGNOSIS — D485 Neoplasm of uncertain behavior of skin: Secondary | ICD-10-CM | POA: Diagnosis not present

## 2023-08-08 DIAGNOSIS — Z85828 Personal history of other malignant neoplasm of skin: Secondary | ICD-10-CM | POA: Diagnosis not present

## 2023-08-08 DIAGNOSIS — Z85820 Personal history of malignant melanoma of skin: Secondary | ICD-10-CM | POA: Diagnosis not present

## 2023-08-08 DIAGNOSIS — L570 Actinic keratosis: Secondary | ICD-10-CM | POA: Diagnosis not present

## 2023-08-08 DIAGNOSIS — Z87898 Personal history of other specified conditions: Secondary | ICD-10-CM | POA: Diagnosis not present

## 2023-08-08 DIAGNOSIS — D045 Carcinoma in situ of skin of trunk: Secondary | ICD-10-CM | POA: Diagnosis not present

## 2023-08-08 DIAGNOSIS — L821 Other seborrheic keratosis: Secondary | ICD-10-CM | POA: Diagnosis not present

## 2023-08-08 DIAGNOSIS — Z808 Family history of malignant neoplasm of other organs or systems: Secondary | ICD-10-CM | POA: Diagnosis not present

## 2023-08-10 ENCOUNTER — Ambulatory Visit (INDEPENDENT_AMBULATORY_CARE_PROVIDER_SITE_OTHER): Payer: PPO | Admitting: Internal Medicine

## 2023-08-10 ENCOUNTER — Encounter: Payer: Self-pay | Admitting: Internal Medicine

## 2023-08-10 VITALS — BP 120/64 | HR 74 | Ht 66.0 in | Wt 186.6 lb

## 2023-08-10 DIAGNOSIS — E782 Mixed hyperlipidemia: Secondary | ICD-10-CM | POA: Diagnosis not present

## 2023-08-10 DIAGNOSIS — I10 Essential (primary) hypertension: Secondary | ICD-10-CM

## 2023-08-10 DIAGNOSIS — E538 Deficiency of other specified B group vitamins: Secondary | ICD-10-CM | POA: Insufficient documentation

## 2023-08-10 DIAGNOSIS — K219 Gastro-esophageal reflux disease without esophagitis: Secondary | ICD-10-CM | POA: Diagnosis not present

## 2023-08-10 DIAGNOSIS — Z1389 Encounter for screening for other disorder: Secondary | ICD-10-CM | POA: Diagnosis not present

## 2023-08-10 DIAGNOSIS — F324 Major depressive disorder, single episode, in partial remission: Secondary | ICD-10-CM | POA: Diagnosis not present

## 2023-08-10 DIAGNOSIS — E038 Other specified hypothyroidism: Secondary | ICD-10-CM | POA: Diagnosis not present

## 2023-08-10 LAB — POCT URINALYSIS DIPSTICK
Bilirubin, UA: NEGATIVE
Blood, UA: NEGATIVE
Glucose, UA: NEGATIVE
Ketones, UA: NEGATIVE
Leukocytes, UA: NEGATIVE
Nitrite, UA: NEGATIVE
Protein, UA: NEGATIVE
Spec Grav, UA: 1.02 (ref 1.010–1.025)
Urobilinogen, UA: 0.2 U/dL
pH, UA: 5.5 (ref 5.0–8.0)

## 2023-08-10 MED ORDER — DULOXETINE HCL 30 MG PO CPEP: 30.0000 mg | ORAL_CAPSULE | Freq: Every day | ORAL | 2 refills | Status: DC

## 2023-08-10 NOTE — Progress Notes (Signed)
Established Patient Office Visit  Subjective:  Patient ID: [name], female    DOB: 24-[name]-1941  Age: 83 y.o. MRN: [id number]  Chief Complaint  Patient presents with   Acute Visit    Sweating issue    Patient comes in for her follow-up today.  She mentions mild low back pain, but her urine dipstick in office is negative today. Patient also mentions that she is still feeling anxious and down on her Celexa 20 mg/day.  Her PHQ-9/GAD score is 8/7.  Agrees to switch to Cymbalta 30 mg/day which will help with her aches and pains as well.  Will consider increasing to 60 mg if tolerated. Patient will return fasting for lab work.    No other concerns at this time.   Past Medical History:  Diagnosis Date   Acid reflux    Allergy    Anemia    Anxiety    in past   Arthritis    all over   Asthma    Atypical moles    Bronchitis    Bronchospasm    reactive with perfume/cologne and smoke   CAD (coronary artery disease)    patient denies on preop of 10/24    Candida esophagitis (HCC)    Carotid artery calcification, bilateral 04/2016   Carpal tunnel syndrome of left wrist 12/03/2019   Cataract    Cervical stenosis of spine    Chronic insomnia    Complication of anesthesia    shoulder surgery- not all the way asleep   COVID-19 02/04/2021   also 10/3 or 04/20/21 sxs started 04/05/21   Depression    in past   Diverticulitis    Diverticulosis    Dizziness    Elevated liver enzymes    Family history of adverse reaction to anesthesia    sister- has problems with nausea and vomiting    Fatigue    Fatty liver    Generalized headache    migraines   GERD (gastroesophageal reflux disease)    Gross hematuria 08/18/2022   Hereditary and idiopathic peripheral neuropathy 05/11/2015   History of chemotherapy    topical cream for h/o skin CA   History of kidney stones    History of neuropathy    FH Dr. Allena Katz neurology   History of vertigo    Hx of migraines    Hyperlipidemia     Hypothyroidism    Insomnia    Irritable bowel syndrome    LLQ abdominal pain    Melanoma (HCC) 2013   Melanoma (HCC)    x2 stg II melanoma   Neuropathy, peripheral    Peripheral neuropathy    Pneumonia    hx of x 2   PONV (postoperative nausea and vomiting)    Pre-diabetes    Retina disorder    left   SCC (squamous cell carcinoma)    skin    Skin cancer 07/17/2019   SCC left leg s/p mohs   SCC 05/01/18 left medial mid leg, right post mid leg 06/08/18, left mid well 04/10/19  Dr. Emily Filbert derm, Dr. Glennis Brink mohs       Stroke [facility])    ? TIA per Dr V per patient    Thyroid disease    Urine incontinence    Viral URI with cough 06/09/2022   Vitamin D deficiency    Weight gain     Past Surgical History:  Procedure Laterality Date   ABLATION Bilateral    Dr. Wyn Quaker 12/2020 veins  laser ablation of both great saphenous veins in a staged fashion.   BILATERAL SALPINGOOPHORECTOMY     ovarian cyst Dr. Dareen Piano and Dr. Allie Dimmer 2008   BREAST SURGERY     in 2014 bx breast calcifications   BREAST SURGERY     augmentation with saline Dr. Karmen Bongo 1995    CHOLECYSTECTOMY N/A 01/28/2015   Procedure: LAPAROSCOPIC CHOLECYSTECTOMY WITH INTRAOPERATIVE CHOLANGIOGRAM;  Surgeon: Chevis Pretty III, MD;  Location: [facility] OR;  Service: General;  Laterality: N/A;   CLOSED MANIPULATION SHOULDER     rt   COLONOSCOPY WITH PROPOFOL N/A 11/20/2012   Procedure: COLONOSCOPY WITH PROPOFOL;  Surgeon: Charolett Bumpers, MD;  Location: [facility];  Service: Endoscopy;  Laterality: N/A;   colonscopy     May 2014   JOINT REPLACEMENT     RTK   KNEE ARTHROSCOPY     left knee miniscus tear repair     MELANOMA EXCISION WITH SENTINEL LYMPH NODE BIOPSY  03/18/2011   Back, nodes neg   OOPHORECTOMY     ROTATOR CUFF REPAIR     left   SHOULDER SURGERY     right and left    SKIN CANCER EXCISION     multiple   TOTAL HIP ARTHROPLASTY     TOTAL KNEE ARTHROPLASTY     right 2010 and left in 2018    TOTAL KNEE  ARTHROPLASTY Left 05/16/2017   Procedure: LEFT TOTAL KNEE ARTHROPLASTY;  Surgeon: Durene Romans, MD;  Location: [facility] ORS;  Service: Orthopedics;  Laterality: Left;  70 mins   TUBAL LIGATION     TUMOR REMOVAL     ovary    Social History   Socioeconomic History   Marital status: Divorced    Spouse name: Not on file   Number of children: 3   Years of education: College   Highest education level: Some college, no degree  Occupational History   Occupation: Retired    Associate Professor: RETIRED  Tobacco Use   Smoking status: Never   Smokeless tobacco: Never  Vaping Use   Vaping status: Never Used  Substance and Sexual Activity   Alcohol use: Yes    Alcohol/week: 1.0 standard drink of alcohol    Types: 1 Cans of beer per week    Comment: once a week beer or wine   Drug use: No   Sexual activity: Not Currently  Other Topics Concern   Not on file  Social History Narrative   Patient lives at home alone in a one story home.   Has 3 children (2 daughters and 1 son)    Retired from [employer].   Divorced since 1992    Caffeine Use: 1 cup daily and a soft drink occasionally    No guns    Wears seat belt     Lives with cat    Left handed   Social Drivers of Health   Financial Resource Strain: Low Risk  (04/18/2023)   Overall Financial Resource Strain (CARDIA)    Difficulty of Paying Living Expenses: Not hard at all  Food Insecurity: No Food Insecurity (04/18/2023)   Hunger Vital Sign    Worried About Running Out of Food in the Last Year: Never true    Ran Out of Food in the Last Year: Never true  Transportation Needs: No Transportation Needs (04/18/2023)   PRAPARE - Administrator, Civil Service (Medical): No    Lack of Transportation (Non-Medical): No  Physical Activity: Inactive (04/18/2023)  Exercise Vital Sign    Days of Exercise per Week: 0 days    Minutes of Exercise per Session: 0 min  Stress: Stress Concern Present (04/18/2023)   [employer] of  Occupational Health - Occupational Stress Questionnaire    Feeling of Stress : To some extent  Social Connections: Moderately Integrated (04/18/2023)   Social Connection and Isolation Panel [NHANES]    Frequency of Communication with Friends and Family: More than three times a week    Frequency of Social Gatherings with Friends and Family: Three times a week    Attends Religious Services: More than 4 times per year    Active Member of Clubs or Organizations: Yes    Attends Banker Meetings: More than 4 times per year    Marital Status: Divorced  Intimate Partner Violence: Not At Risk (04/18/2023)   Humiliation, Afraid, Rape, and Kick questionnaire    Fear of Current or Ex-Partner: No    Emotionally Abused: No    Physically Abused: No    Sexually Abused: No    Family History  Problem Relation Age of Onset   Hypertension Mother    Stroke Mother    Hyperlipidemia Mother    Heart disease Father    Diabetes Father    Hypertension Father    Hyperlipidemia Father    Early death Father    Diabetes Sister    Heart disease Sister        heart murmur   Arthritis Sister    COPD Sister    Hyperlipidemia Sister    Hypertension Sister    Colon polyps Sister        adenomous   Arthritis Sister    Heart disease Sister    Hyperlipidemia Sister    Hypertension Sister    Cancer Sister    Dementia Sister    Tremor Sister    Stroke Maternal Grandmother    Colon cancer Maternal Grandfather    Cancer Maternal Grandfather    Early death Maternal Grandfather    Early death Paternal Grandmother    Stroke Paternal Grandmother    Heart disease Daughter    Hyperlipidemia Daughter    Hypertension Daughter    Heart disease Son    Hyperlipidemia Son    Alcohol abuse Son    Depression Son    Diabetes Son    Pancreatic cancer Maternal Uncle    Alzheimer's disease Paternal Aunt    Alzheimer's disease Paternal Aunt    Alzheimer's disease Paternal Aunt    Alzheimer's disease  Paternal Aunt    Cancer Other        2 paternal uncles and 1 maternal uncle colon cancer   Esophageal cancer Neg Hx    Stomach cancer Neg Hx     Allergies  Allergen Reactions   Epinephrine Other (See Comments) and Anaphylaxis    Heart palpitations   Penicillins Swelling and Other (See Comments)    Lip swelling Has patient had a PCN reaction causing immediate rash, facial/tongue/throat swelling, SOB or lightheadedness with hypotension: Yes Has patient had a PCN reaction causing severe rash involving mucus membranes or skin necrosis: No Has patient had a PCN reaction that required hospitalization No Has patient had a PCN reaction occurring within the last 10 years: No If all of the above answers are "NO", then may proceed with Cephalosporin use.   Other     Black pepper- caused bronchial tubes to start closing    Valium [Diazepam] Other (See Comments)  Other reaction(s): Other (See Comments) Overly sensitive  sedation    Outpatient Medications Prior to Visit  Medication Sig   albuterol (PROAIR HFA) 108 (90 Base) MCG/ACT inhaler Inhale 1-2 puffs into the lungs every 6 (six) hours as needed for wheezing or shortness of breath.   aspirin 81 MG tablet TAKE 1 TABLET BY MOUTH EVERY DAY   atorvastatin (LIPITOR) 20 MG tablet Take 1 tablet (20 mg total) by mouth daily.   Biotin 1000 MCG tablet Take 1,000 mcg by mouth daily.   BREO ELLIPTA 100-25 MCG/ACT AEPB Inhale 1 puff into the lungs daily.   Calcium 600-200 MG-UNIT tablet Take 1 tablet daily by mouth.   Cholecalciferol (VITAMIN D3) 2000 UNITS capsule Take 2,000 Units by mouth daily.   cholestyramine (QUESTRAN) 4 g packet Take 1 packet (4 g total) by mouth 2 (two) times daily as needed.   FOLIC ACID PO Take by mouth daily.   levothyroxine (SYNTHROID) 75 MCG tablet Take 1 tablet (75 mcg total) by mouth daily before breakfast.   Magnesium 250 MG TABS Take by mouth.   olmesartan (BENICAR) 20 MG tablet Take 0.5 tablets (10 mg total) by  mouth in the morning and at bedtime. D/c 5 mg (10 mg bid of 5 mg)   Omega-3 Fatty Acids (FISH OIL) 1000 MG CAPS Take 1,000 mg by mouth daily.    ondansetron (ZOFRAN) 4 MG tablet Take 4 mg by mouth every 8 (eight) hours as needed for nausea or vomiting.   pantoprazole (PROTONIX) 40 MG tablet Take 40 mg by mouth 2 (two) times daily.   Potassium (POTASSIMIN PO) Take by mouth.   sucralfate (CARAFATE) 1 g tablet Take 1 tablet (1 g total) by mouth every 8 (eight) hours as needed. Slowly dissolve tablet in 1 tablespoon of distilled water before ingesting   vitamin B-12 (CYANOCOBALAMIN) 1000 MCG tablet Take 2,000 mcg by mouth daily. 2 tabs   [DISCONTINUED] citalopram (CELEXA) 20 MG tablet Take 1 tablet (20 mg total) by mouth daily.   Vonoprazan Fumarate (VOQUEZNA) 20 MG TABS Take 20 mg by mouth daily. For 8 weeks (Patient not taking: Reported on 08/10/2023)   No facility-administered medications prior to visit.    Review of Systems  Constitutional: Negative.  Negative for chills, fever, malaise/fatigue and weight loss.  HENT: Negative.  Negative for congestion and sore throat.   Eyes: Negative.   Respiratory: Negative.  Negative for cough and shortness of breath.   Cardiovascular: Negative.  Negative for chest pain, palpitations and leg swelling.  Gastrointestinal: Negative.  Negative for abdominal pain, constipation, diarrhea, heartburn, nausea and vomiting.  Genitourinary: Negative.  Negative for dysuria and flank pain.  Musculoskeletal:  Positive for back pain and joint pain. Negative for myalgias.  Skin: Negative.   Neurological: Negative.  Negative for dizziness and headaches.  Endo/Heme/Allergies: Negative.   Psychiatric/Behavioral:  Positive for depression. Negative for suicidal ideas. The patient is nervous/anxious.        Objective:   BP 120/64   Pulse 74   Ht 5\' 6"  (1.676 m)   Wt 186 lb 9.6 oz (84.6 kg)   SpO2 96%   BMI 30.12 kg/m   Vitals:   08/10/23 1121  BP: 120/64   Pulse: 74  Height: 5\' 6"  (1.676 m)  Weight: 186 lb 9.6 oz (84.6 kg)  SpO2: 96%  BMI (Calculated): 30.13    Physical Exam Vitals and nursing note reviewed.  Constitutional:      Appearance: Normal appearance.  HENT:  Head: Normocephalic and atraumatic.     Nose: Nose normal.     Mouth/Throat:     Mouth: Mucous membranes are moist.     Pharynx: Oropharynx is clear.  Eyes:     Conjunctiva/sclera: Conjunctivae normal.     Pupils: Pupils are equal, round, and reactive to light.  Cardiovascular:     Rate and Rhythm: Normal rate and regular rhythm.     Pulses: Normal pulses.     Heart sounds: Normal heart sounds. No murmur heard. Pulmonary:     Effort: Pulmonary effort is normal.     Breath sounds: Normal breath sounds. No wheezing.  Abdominal:     General: Bowel sounds are normal.     Palpations: Abdomen is soft.     Tenderness: There is no abdominal tenderness. There is no right CVA tenderness or left CVA tenderness.  Musculoskeletal:        General: Normal range of motion.     Cervical back: Normal range of motion.     Right lower leg: No edema.     Left lower leg: No edema.  Skin:    General: Skin is warm and dry.  Neurological:     General: No focal deficit present.     Mental Status: She is alert and oriented to person, place, and time.  Psychiatric:        Mood and Affect: Mood normal.        Behavior: Behavior normal.      Results for orders placed or performed in visit on 08/10/23  POCT Urinalysis Dipstick (81002)  Result Value Ref Range   Color, UA yellow    Clarity, UA clear    Glucose, UA Negative Negative   Bilirubin, UA neg    Ketones, UA neg    Spec Grav, UA 1.020 1.010 - 1.025   Blood, UA neg    pH, UA 5.5 5.0 - 8.0   Protein, UA Negative Negative   Urobilinogen, UA 0.2 0.2 or 1.0 E.U./dL   Nitrite, UA neg    Leukocytes, UA Negative Negative   Appearance clear    Odor no     Recent Results (from the past 2160 hours)  Surgical pathology  ([facility])     Status: None   Collection Time: 05/16/23 12:00 AM  Result Value Ref Range   SURGICAL PATHOLOGY      SURGICAL PATHOLOGY [facility] [address] [place], [place] [address] Telephone [phone] or [phone] Fax [phone]  REPORT OF SURGICAL PATHOLOGY   Accession #: [id number] Patient Name: IRATZE, CURLER Visit # : [id number]  MRN: [id number] Physician: Stan Head DOB/Age [birth year] (Age: 7) Gender: F Collected Date: 05/16/2023 Received Date: 05/17/2023  FINAL DIAGNOSIS       1. Surgical [P], gastric antrum :       GASTRIC ANTRAL MUCOSA WITH FOCAL MILD CHRONIC INACTIVE GASTRITIS AND REACTIVE      EPITHELIAL CHANGES.      FOCAL INCREASED LAMINA PROPRIA EOSINOPHILS (UP TO 100 EOSINOPHILS / HIGH POWER      FIELD).      NO H. PYLORI IDENTIFIED ON H&E STAIN.      NEGATIVE FOR INTESTINAL METAPLASIA OR DYSPLASIA.      SEE NOTE.       2. Surgical [P], distal esophagus :       SQUAMOUS MUCOSA WITH REACTIVE EPITHELIAL CHANGES AND SEPARATED FIBRINOUS      PURULENT EXUDATES, SUGGESTIVE OF REFLUX DISEASE.  NEGATIVE FOR  INTESTINAL METAPLASIA OR DYSPLASIA.       Diagnosis Note : There are focal increased lamina propria eosinophils (up to 100      eosinophils / high power filed) with eosinophilic aggregates.The findings could      be associated with medication effect, allergic reaction, or entirely      non-specific.The possibility of eosinophilic gastritis is low but cannot be      entirely excluded.Correlation with clinical information is recommended.      ELECTRONIC SIGNATURE : Lance Coon Md, Pathologist, Electronic Signature  MICROSCOPIC DESCRIPTION  CASE COMMENTS STAINS USED IN DIAGNOSIS: H&E H&E    CLINICAL HISTORY  SPECIMEN(S) OBTAINED 1. Surgical [P], Gastric Antrum 2. Surgical [P], Distal Esophagus  SPECIMEN COMMENTS: 1. Hematemesis, unspecified whether nausea present; gastroesophageal  reflux disease unspecified whether esophagitis present SPECIMEN CLINICAL INFORMATION: 1. R/O H.pylori 2. R/O other reflux    Gross Description 1. Received in formalin are ta n, soft tissue fragments that are submitted in toto.Number: 2, Size: 0.2 cm smallest to 0.4 cm largest, (1B) ( TA ) 2. Received in formalin are tan, soft tissue fragments that are submitted in toto.Number: 2 Size: 0.3 cm, (1B) ( TA )        Report signed out from the following location(s) [place]. [facility] [address], [place] [address] CLIA #: [id number]  [facility] [address] [place], [place] [address] CLIA #: [id number]   Pulmonary Function Test [facility] Only     Status: None   Collection Time: 06/08/23  8:50 AM  Result Value Ref Range   FVC-Pre 2.09 L   FVC-%Pred-Pre 75 %   FVC-Post 2.14 L   FVC-%Pred-Post 77 %   FVC-%Change-Post 2 %   FEV1-Pre 1.56 L   FEV1-%Pred-Pre 75 %   FEV1-Post 1.66 L   FEV1-%Pred-Post 80 %   FEV1-%Change-Post 5 %   FEV6-Pre 2.06 L   FEV6-%Pred-Pre 78 %   FEV6-Post 2.14 L   FEV6-%Pred-Post 81 %   FEV6-%Change-Post 3 %   Pre FEV1/FVC ratio 75 %   FEV1FVC-%Pred-Pre 102 %   Post FEV1/FVC ratio 77 %   FEV1FVC-%Change-Post 3 %   Pre FEV6/FVC Ratio 100 %   FEV6FVC-%Pred-Pre 105 %   Post FEV6/FVC ratio 100 %   FEV6FVC-%Pred-Post 105 %   FEF 25-75 Pre 1.27 L/sec   FEF2575-%Pred-Pre 89 %   FEF 25-75 Post 1.39 L/sec   FEF2575-%Pred-Post 97 %   FEF2575-%Change-Post 9 %   RV 2.55 L   RV % pred 100 %   TLC 4.83 L   TLC % pred 90 %   DLCO unc 16.21 ml/min/mmHg   DLCO unc % pred 81 %   DL/VA 4.40 ml/min/mmHg/L   DL/VA % pred 85 %  Pancreatic elastase, fecal     Status: None   Collection Time: 07/03/23 11:00 AM  Result Value Ref Range   Pancreatic Elastase, Fecal 553 >200 ug Elast./g    Comment:        Severe Pancreatic Insufficiency:          <100        Moderate Pancreatic Insufficiency:   100 - 200        Normal:                                    >200   POCT Urinalysis Dipstick (81002)  Status: None   Collection Time: 08/10/23 11:48 AM  Result Value Ref Range   Color, UA yellow    Clarity, UA clear    Glucose, UA Negative Negative   Bilirubin, UA neg    Ketones, UA neg    Spec Grav, UA 1.020 1.010 - 1.025   Blood, UA neg    pH, UA 5.5 5.0 - 8.0   Protein, UA Negative Negative   Urobilinogen, UA 0.2 0.2 or 1.0 E.U./dL   Nitrite, UA neg    Leukocytes, UA Negative Negative   Appearance clear    Odor no       Assessment & Plan:  Patient advised to stop Celexa.  Start Cymbalta 30 mg/day.  Continue rest of medications.  Return for fasting labs tomorrow. Problem List Items Addressed This Visit     Hypothyroidism   Relevant Orders   TSH+T4F+T3Free   Hyperlipidemia   Relevant Orders   Lipid Panel w/o Chol/HDL Ratio   GERD (gastroesophageal reflux disease)   Relevant Medications   pantoprazole (PROTONIX) 40 MG tablet   Other Relevant Orders   CBC with Diff   Essential hypertension, benign   Relevant Orders   CMP14+EGFR   Other Visit Diagnoses       Major depressive disorder with single episode, in partial remission (HCC)    -  Primary   Relevant Medications   DULoxetine (CYMBALTA) 30 MG capsule     Vitamin B12 deficiency       Relevant Orders   Vitamin B12     Screening for blood or protein in urine       Relevant Orders   POCT Urinalysis Dipstick (16109) (Completed)       Return in about 3 weeks (around 08/31/2023).   Total time spent: 30 minutes  Margaretann Loveless, MD  08/10/2023   This document may have been prepared by [facility] Voice Recognition software and as such may include unintentional dictation errors.

## 2023-08-11 ENCOUNTER — Other Ambulatory Visit: Payer: PPO

## 2023-08-11 DIAGNOSIS — E038 Other specified hypothyroidism: Secondary | ICD-10-CM | POA: Diagnosis not present

## 2023-08-11 DIAGNOSIS — I10 Essential (primary) hypertension: Secondary | ICD-10-CM | POA: Diagnosis not present

## 2023-08-11 DIAGNOSIS — K219 Gastro-esophageal reflux disease without esophagitis: Secondary | ICD-10-CM | POA: Diagnosis not present

## 2023-08-11 DIAGNOSIS — E782 Mixed hyperlipidemia: Secondary | ICD-10-CM | POA: Diagnosis not present

## 2023-08-11 DIAGNOSIS — E538 Deficiency of other specified B group vitamins: Secondary | ICD-10-CM | POA: Diagnosis not present

## 2023-08-12 LAB — CBC WITH DIFFERENTIAL/PLATELET
Basophils Absolute: 0 10*3/uL (ref 0.0–0.2)
Basos: 1 %
EOS (ABSOLUTE): 0.2 10*3/uL (ref 0.0–0.4)
Eos: 4 %
Hematocrit: 39.5 % (ref 34.0–46.6)
Hemoglobin: 12.9 g/dL (ref 11.1–15.9)
Immature Grans (Abs): 0 10*3/uL (ref 0.0–0.1)
Immature Granulocytes: 0 %
Lymphocytes Absolute: 1.8 10*3/uL (ref 0.7–3.1)
Lymphs: 35 %
MCH: 29.3 pg (ref 26.6–33.0)
MCHC: 32.7 g/dL (ref 31.5–35.7)
MCV: 90 fL (ref 79–97)
Monocytes Absolute: 0.5 10*3/uL (ref 0.1–0.9)
Monocytes: 10 %
Neutrophils Absolute: 2.7 10*3/uL (ref 1.4–7.0)
Neutrophils: 50 %
Platelets: 222 10*3/uL (ref 150–450)
RBC: 4.41 x10E6/uL (ref 3.77–5.28)
RDW: 12.3 % (ref 11.7–15.4)
WBC: 5.2 10*3/uL (ref 3.4–10.8)

## 2023-08-12 LAB — CMP14+EGFR
ALT: 11 [IU]/L (ref 0–32)
AST: 14 [IU]/L (ref 0–40)
Albumin: 4.2 g/dL (ref 3.7–4.7)
Alkaline Phosphatase: 77 [IU]/L (ref 44–121)
BUN/Creatinine Ratio: 13 (ref 12–28)
BUN: 15 mg/dL (ref 8–27)
Bilirubin Total: 1.4 mg/dL — ABNORMAL HIGH (ref 0.0–1.2)
CO2: 24 mmol/L (ref 20–29)
Calcium: 9.3 mg/dL (ref 8.7–10.3)
Chloride: 102 mmol/L (ref 96–106)
Creatinine, Ser: 1.15 mg/dL — ABNORMAL HIGH (ref 0.57–1.00)
Globulin, Total: 2 g/dL (ref 1.5–4.5)
Glucose: 120 mg/dL — ABNORMAL HIGH (ref 70–99)
Potassium: 4.9 mmol/L (ref 3.5–5.2)
Sodium: 143 mmol/L (ref 134–144)
Total Protein: 6.2 g/dL (ref 6.0–8.5)
eGFR: 48 mL/min/{1.73_m2} — ABNORMAL LOW (ref >59)

## 2023-08-12 LAB — LIPID PANEL W/O CHOL/HDL RATIO
Cholesterol, Total: 151 mg/dL (ref 100–199)
HDL: 78 mg/dL (ref >39)
LDL Chol Calc (NIH): 57 mg/dL (ref 0–99)
Triglycerides: 84 mg/dL (ref 0–149)
VLDL Cholesterol Cal: 16 mg/dL (ref 5–40)

## 2023-08-12 LAB — TSH+T4F+T3FREE
Free T4: 1.37 ng/dL (ref 0.82–1.77)
T3, Free: 2.9 pg/mL (ref 2.0–4.4)
TSH: 2.55 u[IU]/mL (ref 0.450–4.500)

## 2023-08-12 LAB — VITAMIN B12: Vitamin B-12: 964 pg/mL (ref 232–1245)

## 2023-08-14 NOTE — Progress Notes (Signed)
Patient notified

## 2023-08-28 ENCOUNTER — Other Ambulatory Visit: Payer: Self-pay | Admitting: Internal Medicine

## 2023-08-28 ENCOUNTER — Telehealth: Payer: Self-pay | Admitting: Internal Medicine

## 2023-08-28 NOTE — Progress Notes (Unsigned)
Olmesartan 

## 2023-08-28 NOTE — Telephone Encounter (Signed)
 Patient left a VM that she needs a new Rx for olmesartan . She is taking a 1/2 tab BID of 20 mg. Can we send new Rx for 10 mg BID? When she cuts the 20 mg pills in half they get crushed a lot of the time and she's worried she isn't getting the right amount of medication.  [facility] [address]

## 2023-08-29 ENCOUNTER — Other Ambulatory Visit: Payer: Self-pay | Admitting: Internal Medicine

## 2023-08-29 DIAGNOSIS — I10 Essential (primary) hypertension: Secondary | ICD-10-CM

## 2023-08-29 MED ORDER — OLMESARTAN MEDOXOMIL 5 MG PO TABS: 10.0000 mg | ORAL_TABLET | Freq: Two times a day (BID) | ORAL | 6 refills | Status: DC

## 2023-08-31 ENCOUNTER — Ambulatory Visit: Payer: PPO | Admitting: Internal Medicine

## 2023-09-01 ENCOUNTER — Ambulatory Visit: Payer: PPO | Admitting: Internal Medicine

## 2023-09-01 ENCOUNTER — Encounter: Payer: Self-pay | Admitting: Internal Medicine

## 2023-09-01 VITALS — BP 124/74 | HR 76 | Ht 66.0 in | Wt 184.4 lb

## 2023-09-01 DIAGNOSIS — E782 Mixed hyperlipidemia: Secondary | ICD-10-CM

## 2023-09-01 DIAGNOSIS — F324 Major depressive disorder, single episode, in partial remission: Secondary | ICD-10-CM

## 2023-09-01 DIAGNOSIS — Z9189 Other specified personal risk factors, not elsewhere classified: Secondary | ICD-10-CM | POA: Diagnosis not present

## 2023-09-01 DIAGNOSIS — I10 Essential (primary) hypertension: Secondary | ICD-10-CM

## 2023-09-01 DIAGNOSIS — E538 Deficiency of other specified B group vitamins: Secondary | ICD-10-CM

## 2023-09-01 DIAGNOSIS — K219 Gastro-esophageal reflux disease without esophagitis: Secondary | ICD-10-CM | POA: Diagnosis not present

## 2023-09-01 NOTE — Progress Notes (Signed)
Established Patient Office Visit  Subjective:  Patient ID: [name], female    DOB: Sep 10, 1940  Age: 83 y.o. MRN: [id number]  Chief Complaint  Patient presents with   Follow-up    3 week follow up    Patient comes in for follow-up today.  Generally feels well and is tolerating Cymbalta at 30 mg/day.  Her blood pressure is stable.  Labs were done recently and the results were discussed today.  Will check her hemoglobin A1c at next blood draw.  Patient had to miss her DEXA scan.  Will reschedule.    No other concerns at this time.   Past Medical History:  Diagnosis Date   Acid reflux    Allergy    Anemia    Anxiety    in past   Arthritis    all over   Asthma    Atypical moles    Bronchitis    Bronchospasm    reactive with perfume/cologne and smoke   CAD (coronary artery disease)    patient denies on preop of 10/24    Candida esophagitis (HCC)    Carotid artery calcification, bilateral 04/2016   Carpal tunnel syndrome of left wrist 12/03/2019   Cataract    Cervical stenosis of spine    Chronic insomnia    Complication of anesthesia    shoulder surgery- not all the way asleep   COVID-19 02/04/2021   also 10/3 or 04/20/21 sxs started 04/05/21   Depression    in past   Diverticulitis    Diverticulosis    Dizziness    Elevated liver enzymes    Family history of adverse reaction to anesthesia    sister- has problems with nausea and vomiting    Fatigue    Fatty liver    Generalized headache    migraines   GERD (gastroesophageal reflux disease)    Gross hematuria 08/18/2022   Hereditary and idiopathic peripheral neuropathy 05/11/2015   History of chemotherapy    topical cream for h/o skin CA   History of kidney stones    History of neuropathy    FH Dr. Allena Katz neurology   History of vertigo    Hx of migraines    Hyperlipidemia    Hypothyroidism    Insomnia    Irritable bowel syndrome    LLQ abdominal pain    Melanoma (HCC) 2013   Melanoma (HCC)    x2  stg II melanoma   Neuropathy, peripheral    Peripheral neuropathy    Pneumonia    hx of x 2   PONV (postoperative nausea and vomiting)    Pre-diabetes    Retina disorder    left   SCC (squamous cell carcinoma)    skin    Skin cancer 07/17/2019   SCC left leg s/p mohs   SCC 05/01/18 left medial mid leg, right post mid leg 06/08/18, left mid well 04/10/19  Dr. Emily Filbert derm, Dr. Glennis Brink mohs       Stroke [facility])    ? TIA per Dr V per patient    Thyroid disease    Urine incontinence    Viral URI with cough 06/09/2022   Vitamin D deficiency    Weight gain     Past Surgical History:  Procedure Laterality Date   ABLATION Bilateral    Dr. Wyn Quaker 12/2020 veins laser ablation of both great saphenous veins in a staged fashion.   BILATERAL SALPINGOOPHORECTOMY     ovarian cyst Dr. Dareen Piano  and Dr. Allie Dimmer 2008   BREAST SURGERY     in 2014 bx breast calcifications   BREAST SURGERY     augmentation with saline Dr. Karmen Bongo 1995    CHOLECYSTECTOMY N/A 01/28/2015   Procedure: LAPAROSCOPIC CHOLECYSTECTOMY WITH INTRAOPERATIVE CHOLANGIOGRAM;  Surgeon: Chevis Pretty III, MD;  Location: [facility] OR;  Service: General;  Laterality: N/A;   CLOSED MANIPULATION SHOULDER     rt   COLONOSCOPY WITH PROPOFOL N/A 11/20/2012   Procedure: COLONOSCOPY WITH PROPOFOL;  Surgeon: Charolett Bumpers, MD;  Location: [facility] ENDOSCOPY;  Service: Endoscopy;  Laterality: N/A;   colonscopy     May 2014   JOINT REPLACEMENT     RTK   KNEE ARTHROSCOPY     left knee miniscus tear repair     MELANOMA EXCISION WITH SENTINEL LYMPH NODE BIOPSY  03/18/2011   Back, nodes neg   OOPHORECTOMY     ROTATOR CUFF REPAIR     left   SHOULDER SURGERY     right and left    SKIN CANCER EXCISION     multiple   TOTAL HIP ARTHROPLASTY     TOTAL KNEE ARTHROPLASTY     right 2010 and left in 2018    TOTAL KNEE ARTHROPLASTY Left 05/16/2017   Procedure: LEFT TOTAL KNEE ARTHROPLASTY;  Surgeon: Durene Romans, MD;  Location: [facility] ORS;  Service:  Orthopedics;  Laterality: Left;  70 mins   TUBAL LIGATION     TUMOR REMOVAL     ovary    Social History   Socioeconomic History   Marital status: Divorced    Spouse name: Not on file   Number of children: 3   Years of education: College   Highest education level: Some college, no degree  Occupational History   Occupation: Retired    Associate Professor: RETIRED  Tobacco Use   Smoking status: Never   Smokeless tobacco: Never  Vaping Use   Vaping status: Never Used  Substance and Sexual Activity   Alcohol use: Yes    Alcohol/week: 1.0 standard drink of alcohol    Types: 1 Cans of beer per week    Comment: once a week beer or wine   Drug use: No   Sexual activity: Not Currently  Other Topics Concern   Not on file  Social History Narrative   Patient lives at home alone in a one story home.   Has 3 children (2 daughters and 1 son)    Retired from [employer].   Divorced since 1992    Caffeine Use: 1 cup daily and a soft drink occasionally    No guns    Wears seat belt     Lives with cat    Left handed   Social Drivers of Health   Financial Resource Strain: Low Risk  (04/18/2023)   Overall Financial Resource Strain (CARDIA)    Difficulty of Paying Living Expenses: Not hard at all  Food Insecurity: No Food Insecurity (04/18/2023)   Hunger Vital Sign    Worried About Running Out of Food in the Last Year: Never true    Ran Out of Food in the Last Year: Never true  Transportation Needs: No Transportation Needs (04/18/2023)   PRAPARE - Administrator, Civil Service (Medical): No    Lack of Transportation (Non-Medical): No  Physical Activity: Inactive (04/18/2023)   Exercise Vital Sign    Days of Exercise per Week: 0 days    Minutes of Exercise per Session: 0  min  Stress: Stress Concern Present (04/18/2023)   [employer] of Occupational Health - Occupational Stress Questionnaire    Feeling of Stress : To some extent  Social Connections: Moderately  Integrated (04/18/2023)   Social Connection and Isolation Panel [NHANES]    Frequency of Communication with Friends and Family: More than three times a week    Frequency of Social Gatherings with Friends and Family: Three times a week    Attends Religious Services: More than 4 times per year    Active Member of Clubs or Organizations: Yes    Attends Banker Meetings: More than 4 times per year    Marital Status: Divorced  Intimate Partner Violence: Not At Risk (04/18/2023)   Humiliation, Afraid, Rape, and Kick questionnaire    Fear of Current or Ex-Partner: No    Emotionally Abused: No    Physically Abused: No    Sexually Abused: No    Family History  Problem Relation Age of Onset   Hypertension Mother    Stroke Mother    Hyperlipidemia Mother    Heart disease Father    Diabetes Father    Hypertension Father    Hyperlipidemia Father    Early death Father    Diabetes Sister    Heart disease Sister        heart murmur   Arthritis Sister    COPD Sister    Hyperlipidemia Sister    Hypertension Sister    Colon polyps Sister        adenomous   Arthritis Sister    Heart disease Sister    Hyperlipidemia Sister    Hypertension Sister    Cancer Sister    Dementia Sister    Tremor Sister    Stroke Maternal Grandmother    Colon cancer Maternal Grandfather    Cancer Maternal Grandfather    Early death Maternal Grandfather    Early death Paternal Grandmother    Stroke Paternal Grandmother    Heart disease Daughter    Hyperlipidemia Daughter    Hypertension Daughter    Heart disease Son    Hyperlipidemia Son    Alcohol abuse Son    Depression Son    Diabetes Son    Pancreatic cancer Maternal Uncle    Alzheimer's disease Paternal Aunt    Alzheimer's disease Paternal Aunt    Alzheimer's disease Paternal Aunt    Alzheimer's disease Paternal Aunt    Cancer Other        2 paternal uncles and 1 maternal uncle colon cancer   Esophageal cancer Neg Hx    Stomach  cancer Neg Hx     Allergies  Allergen Reactions   Epinephrine Other (See Comments) and Anaphylaxis    Heart palpitations   Penicillins Swelling and Other (See Comments)    Lip swelling Has patient had a PCN reaction causing immediate rash, facial/tongue/throat swelling, SOB or lightheadedness with hypotension: Yes Has patient had a PCN reaction causing severe rash involving mucus membranes or skin necrosis: No Has patient had a PCN reaction that required hospitalization No Has patient had a PCN reaction occurring within the last 10 years: No If all of the above answers are "NO", then may proceed with Cephalosporin use.   Other     Black pepper- caused bronchial tubes to start closing    Valium [Diazepam] Other (See Comments)    Other reaction(s): Other (See Comments) Overly sensitive  sedation    Outpatient Medications Prior to Visit  Medication  Sig   albuterol (PROAIR HFA) 108 (90 Base) MCG/ACT inhaler Inhale 1-2 puffs into the lungs every 6 (six) hours as needed for wheezing or shortness of breath.   aspirin 81 MG tablet TAKE 1 TABLET BY MOUTH EVERY DAY   atorvastatin (LIPITOR) 20 MG tablet Take 1 tablet (20 mg total) by mouth daily.   Biotin 1000 MCG tablet Take 1,000 mcg by mouth daily.   BREO ELLIPTA 100-25 MCG/ACT AEPB Inhale 1 puff into the lungs daily.   Calcium 600-200 MG-UNIT tablet Take 1 tablet daily by mouth.   Cholecalciferol (VITAMIN D3) 2000 UNITS capsule Take 2,000 Units by mouth daily.   cholestyramine (QUESTRAN) 4 g packet Take 1 packet (4 g total) by mouth 2 (two) times daily as needed.   DULoxetine (CYMBALTA) 30 MG capsule Take 1 capsule (30 mg total) by mouth daily.   FOLIC ACID PO Take by mouth daily.   levothyroxine (SYNTHROID) 75 MCG tablet Take 1 tablet (75 mcg total) by mouth daily before breakfast.   Magnesium 250 MG TABS Take by mouth.   olmesartan (BENICAR) 5 MG tablet Take 2 tablets (10 mg total) by mouth 2 (two) times daily.   Omega-3 Fatty Acids  (FISH OIL) 1000 MG CAPS Take 1,000 mg by mouth daily.    ondansetron (ZOFRAN) 4 MG tablet Take 4 mg by mouth every 8 (eight) hours as needed for nausea or vomiting.   pantoprazole (PROTONIX) 40 MG tablet Take 40 mg by mouth 2 (two) times daily.   Potassium (POTASSIMIN PO) Take by mouth.   sucralfate (CARAFATE) 1 g tablet Take 1 tablet (1 g total) by mouth every 8 (eight) hours as needed. Slowly dissolve tablet in 1 tablespoon of distilled water before ingesting   vitamin B-12 (CYANOCOBALAMIN) 1000 MCG tablet Take 2,000 mcg by mouth daily. 2 tabs   No facility-administered medications prior to visit.    Review of Systems  Constitutional: Negative.  Negative for chills, fever, malaise/fatigue and weight loss.  HENT: Negative.  Negative for congestion and sinus pain.   Eyes: Negative.   Respiratory: Negative.  Negative for cough and shortness of breath.   Cardiovascular: Negative.  Negative for chest pain, palpitations and leg swelling.  Gastrointestinal: Negative.  Negative for abdominal pain, constipation, diarrhea, heartburn, nausea and vomiting.  Genitourinary: Negative.  Negative for dysuria and flank pain.  Musculoskeletal: Negative.  Negative for joint pain and myalgias.  Skin: Negative.   Neurological: Negative.  Negative for dizziness, tingling, tremors, sensory change and headaches.  Endo/Heme/Allergies: Negative.   Psychiatric/Behavioral: Negative.  Negative for depression and suicidal ideas. The patient is not nervous/anxious.        Objective:   BP 124/74   Pulse 76   Ht 5\' 6"  (1.676 m)   Wt 184 lb 6.4 oz (83.6 kg)   SpO2 96%   BMI 29.76 kg/m   Vitals:   09/01/23 1141  BP: 124/74  Pulse: 76  Height: 5\' 6"  (1.676 m)  Weight: 184 lb 6.4 oz (83.6 kg)  SpO2: 96%  BMI (Calculated): 29.78    Physical Exam Vitals and nursing note reviewed.  Constitutional:      Appearance: Normal appearance.  HENT:     Head: Normocephalic and atraumatic.     Nose: Nose normal.      Mouth/Throat:     Mouth: Mucous membranes are moist.     Pharynx: Oropharynx is clear.  Eyes:     Conjunctiva/sclera: Conjunctivae normal.     Pupils: Pupils are  equal, round, and reactive to light.  Cardiovascular:     Rate and Rhythm: Normal rate and regular rhythm.     Pulses: Normal pulses.     Heart sounds: Normal heart sounds. No murmur heard. Pulmonary:     Effort: Pulmonary effort is normal.     Breath sounds: Normal breath sounds. No wheezing.  Abdominal:     General: Bowel sounds are normal.     Palpations: Abdomen is soft.     Tenderness: There is no abdominal tenderness. There is no right CVA tenderness or left CVA tenderness.  Musculoskeletal:        General: Normal range of motion.     Cervical back: Normal range of motion.     Right lower leg: No edema.     Left lower leg: No edema.  Skin:    General: Skin is warm and dry.  Neurological:     General: No focal deficit present.     Mental Status: She is alert and oriented to person, place, and time.  Psychiatric:        Mood and Affect: Mood normal.        Behavior: Behavior normal.      No results found for any visits on 09/01/23.  Recent Results (from the past 2160 hours)  Pulmonary Function Test [facility] Only     Status: None   Collection Time: 06/08/23  8:50 AM  Result Value Ref Range   FVC-Pre 2.09 L   FVC-%Pred-Pre 75 %   FVC-Post 2.14 L   FVC-%Pred-Post 77 %   FVC-%Change-Post 2 %   FEV1-Pre 1.56 L   FEV1-%Pred-Pre 75 %   FEV1-Post 1.66 L   FEV1-%Pred-Post 80 %   FEV1-%Change-Post 5 %   FEV6-Pre 2.06 L   FEV6-%Pred-Pre 78 %   FEV6-Post 2.14 L   FEV6-%Pred-Post 81 %   FEV6-%Change-Post 3 %   Pre FEV1/FVC ratio 75 %   FEV1FVC-%Pred-Pre 102 %   Post FEV1/FVC ratio 77 %   FEV1FVC-%Change-Post 3 %   Pre FEV6/FVC Ratio 100 %   FEV6FVC-%Pred-Pre 105 %   Post FEV6/FVC ratio 100 %   FEV6FVC-%Pred-Post 105 %   FEF 25-75 Pre 1.27 L/sec   FEF2575-%Pred-Pre 89 %   FEF 25-75 Post 1.39 L/sec    FEF2575-%Pred-Post 97 %   FEF2575-%Change-Post 9 %   RV 2.55 L   RV % pred 100 %   TLC 4.83 L   TLC % pred 90 %   DLCO unc 16.21 ml/min/mmHg   DLCO unc % pred 81 %   DL/VA 1.61 ml/min/mmHg/L   DL/VA % pred 85 %  Pancreatic elastase, fecal     Status: None   Collection Time: 07/03/23 11:00 AM  Result Value Ref Range   Pancreatic Elastase, Fecal 553 >200 ug Elast./g    Comment:        Severe Pancreatic Insufficiency:          <100        Moderate Pancreatic Insufficiency:   100 - 200        Normal:                                   >200   POCT Urinalysis Dipstick (09604)     Status: None   Collection Time: 08/10/23 11:48 AM  Result Value Ref Range   Color, UA yellow    Clarity, UA clear  Glucose, UA Negative Negative   Bilirubin, UA neg    Ketones, UA neg    Spec Grav, UA 1.020 1.010 - 1.025   Blood, UA neg    pH, UA 5.5 5.0 - 8.0   Protein, UA Negative Negative   Urobilinogen, UA 0.2 0.2 or 1.0 E.U./dL   Nitrite, UA neg    Leukocytes, UA Negative Negative   Appearance clear    Odor no   CBC with Diff     Status: None   Collection Time: 08/11/23  9:02 AM  Result Value Ref Range   WBC 5.2 3.4 - 10.8 x10E3/uL   RBC 4.41 3.77 - 5.28 x10E6/uL   Hemoglobin 12.9 11.1 - 15.9 g/dL   Hematocrit 16.1 09.6 - 46.6 %   MCV 90 79 - 97 fL   MCH 29.3 26.6 - 33.0 pg   MCHC 32.7 31.5 - 35.7 g/dL   RDW 04.5 40.9 - 81.1 %   Platelets 222 150 - 450 x10E3/uL   Neutrophils 50 Not Estab. %   Lymphs 35 Not Estab. %   Monocytes 10 Not Estab. %   Eos 4 Not Estab. %   Basos 1 Not Estab. %   Neutrophils Absolute 2.7 1.4 - 7.0 x10E3/uL   Lymphocytes Absolute 1.8 0.7 - 3.1 x10E3/uL   Monocytes Absolute 0.5 0.1 - 0.9 x10E3/uL   EOS (ABSOLUTE) 0.2 0.0 - 0.4 x10E3/uL   Basophils Absolute 0.0 0.0 - 0.2 x10E3/uL   Immature Granulocytes 0 Not Estab. %   Immature Grans (Abs) 0.0 0.0 - 0.1 x10E3/uL  CMP14+EGFR     Status: Abnormal   Collection Time: 08/11/23  9:02 AM  Result Value Ref Range    Glucose 120 (H) 70 - 99 mg/dL   BUN 15 8 - 27 mg/dL   Creatinine, Ser 9.14 (H) 0.57 - 1.00 mg/dL   eGFR 48 (L) >78 GN/FAO/1.30   BUN/Creatinine Ratio 13 12 - 28   Sodium 143 134 - 144 mmol/L   Potassium 4.9 3.5 - 5.2 mmol/L   Chloride 102 96 - 106 mmol/L   CO2 24 20 - 29 mmol/L   Calcium 9.3 8.7 - 10.3 mg/dL   Total Protein 6.2 6.0 - 8.5 g/dL   Albumin 4.2 3.7 - 4.7 g/dL   Globulin, Total 2.0 1.5 - 4.5 g/dL   Bilirubin Total 1.4 (H) 0.0 - 1.2 mg/dL   Alkaline Phosphatase 77 44 - 121 IU/L   AST 14 0 - 40 IU/L   ALT 11 0 - 32 IU/L  Lipid Panel w/o Chol/HDL Ratio     Status: None   Collection Time: 08/11/23  9:02 AM  Result Value Ref Range   Cholesterol, Total 151 100 - 199 mg/dL   Triglycerides 84 0 - 149 mg/dL   HDL 78 >86 mg/dL   VLDL Cholesterol Cal 16 5 - 40 mg/dL   LDL Chol Calc (NIH) 57 0 - 99 mg/dL  VHQ+I6N+[id number]     Status: None   Collection Time: 08/11/23  9:02 AM  Result Value Ref Range   TSH 2.550 0.450 - 4.500 uIU/mL   T3, Free 2.9 2.0 - 4.4 pg/mL   Free T4 1.37 0.82 - 1.77 ng/dL  Vitamin U13     Status: None   Collection Time: 08/11/23  9:02 AM  Result Value Ref Range   Vitamin B-12 964 232 - 1,245 pg/mL      Assessment & Plan:  Continue all medications.  Monitor blood pressure at home.  Reschedule DEXA scan. Problem List Items Addressed This Visit     Hyperlipidemia   GERD (gastroesophageal reflux disease)   Essential hypertension, benign - Primary   Vitamin B12 deficiency   Other Visit Diagnoses       At high risk for osteoporosis       Relevant Orders   DG Bone Density     Major depressive disorder with single episode, in partial remission (HCC)           Return in about 3 months (around 11/29/2023).   Total time spent: 30 minutes  Margaretann Loveless, MD  09/01/2023   This document may have been prepared by [facility] Voice Recognition software and as such may include unintentional dictation errors.

## 2023-09-04 ENCOUNTER — Ambulatory Visit: Payer: PPO | Admitting: Internal Medicine

## 2023-09-05 ENCOUNTER — Other Ambulatory Visit: Payer: Self-pay

## 2023-09-05 ENCOUNTER — Ambulatory Visit: Payer: PPO | Admitting: Gastroenterology

## 2023-09-05 ENCOUNTER — Telehealth: Payer: Self-pay | Admitting: Gastroenterology

## 2023-09-05 MED ORDER — VOQUEZNA 20 MG PO TABS: 20.0000 mg | ORAL_TABLET | Freq: Every day | ORAL | 0 refills | Status: AC

## 2023-09-05 NOTE — Progress Notes (Unsigned)
Called [facility] to check on Voquezna 20 mg once daily for 8 weeks script that was sent in Dec. 2024. Waited on hold for 12 mins.  Resent script to [facility] with request for them to reach out to the patient asap to advise on status of prescription.

## 2023-09-05 NOTE — Telephone Encounter (Signed)
Patient called requesting to speak with a nurse regarding a medication that [facility] PA-C recommended for her to start that needed approval she is not sure which medication it was and has not heard anything about it.[name]

## 2023-09-05 NOTE — Telephone Encounter (Signed)
Called [facility] to check on Voquezna 20 mg once daily for 8 weeks script that was sent in Dec. 2024. Waited on hold for 12 mins.  Resent script to [facility] with request for them to reach out to the patient asap to advise on status of prescription.

## 2023-09-07 ENCOUNTER — Other Ambulatory Visit (HOSPITAL_COMMUNITY): Payer: Self-pay

## 2023-09-07 ENCOUNTER — Telehealth: Payer: Self-pay | Admitting: Pharmacy Technician

## 2023-09-07 NOTE — Telephone Encounter (Signed)
Pharmacy Patient Advocate Encounter   Received notification from CoverMyMeds that prior authorization for VOQUEZNA 20MG  is required/requested.   Insurance verification completed.   The patient is insured through [facility] ADVANTAGE/RX ADVANCE .   Per test claim: PA required; PA submitted to above mentioned insurance via CoverMyMeds Key/confirmation #/EOC [id number] Status is pending

## 2023-09-07 NOTE — Telephone Encounter (Signed)
Called [facility] and they confirmed that they have the new script and have called the patient and gotten her insurance information. They will run the claim.  Apparently they were never able to get ahold of her in December to get her insurance information so the prescription was never processed.  The claim was processed and was determined that the medication requires a PA.  They were provided with the fax number for the PA team:  [phone]

## 2023-09-08 ENCOUNTER — Telehealth: Payer: Self-pay | Admitting: Internal Medicine

## 2023-09-08 ENCOUNTER — Other Ambulatory Visit (HOSPITAL_COMMUNITY): Payer: Self-pay

## 2023-09-08 NOTE — Telephone Encounter (Signed)
Pharmacy Patient Advocate Encounter  Received notification from [facility] ADVANTAGE/RX ADVANCE that Prior Authorization for Voquezna 20MG  tablets  has been APPROVED from 09/08/2023 to 07/17/2024. Ran test claim, Copay is $100.00. This test claim was processed through [facility]- copay amounts may vary at other pharmacies due to pharmacy/plan contracts, or as the patient moves through the different stages of their insurance plan.

## 2023-09-08 NOTE — Telephone Encounter (Signed)
Patient left message with answering service that she wishes to increase her depression medication.  Spoke with patient and she wants to increase her duloxetine. Advised her that Dr. Welton Flakes is on vacation until March. Patient would like to wait for Dr. Welton Flakes to come back to make the changes to her medication. Please advise if patient needs to come in or we can go ahead and increase the duloxetine.

## 2023-09-11 ENCOUNTER — Encounter: Payer: Self-pay | Admitting: Gastroenterology

## 2023-09-11 ENCOUNTER — Ambulatory Visit: Payer: PPO | Admitting: Gastroenterology

## 2023-09-11 VITALS — BP 130/68 | HR 67 | Ht 66.0 in | Wt 186.0 lb

## 2023-09-11 DIAGNOSIS — R194 Change in bowel habit: Secondary | ICD-10-CM

## 2023-09-11 DIAGNOSIS — K3189 Other diseases of stomach and duodenum: Secondary | ICD-10-CM

## 2023-09-11 DIAGNOSIS — K920 Hematemesis: Secondary | ICD-10-CM

## 2023-09-11 DIAGNOSIS — K2101 Gastro-esophageal reflux disease with esophagitis, with bleeding: Secondary | ICD-10-CM

## 2023-09-11 DIAGNOSIS — K449 Diaphragmatic hernia without obstruction or gangrene: Secondary | ICD-10-CM | POA: Diagnosis not present

## 2023-09-11 MED ORDER — VOQUEZNA 20 MG PO TABS: 20.0000 mg | ORAL_TABLET | Freq: Every day | ORAL | Status: DC

## 2023-09-11 NOTE — Telephone Encounter (Signed)
 Can you confirm you received notification from [facility] regarding PA needed for Voquezna 20 mg daily x 8 weeks for erosive esophagitis?  Thank you

## 2023-09-11 NOTE — Progress Notes (Signed)
 HPI :  83 year old female here for follow-up visit.  Recall she has a history of GERD, esophagitis, hiatal hernia, gastritis, as well as altered bowel habits.  Recall this past October she was having worsening reflux symptoms as well as some occasional hematemesis.  She underwent an EGD with Dr. Leone Payor in October (she arrived to the [facility] on the wrong day and I was not available).  EGD was remarkable for a hiatal hernia, erosive esophagitis, and gastritis.  Biopsies of her stomach in the interim showed gastritis with focally upwards of 100 eosinophils per high-powered field.  Unclear etiology.  Negative for H. pylori.  She has been on Protonix 40 mg twice daily for the past 3 to 4 months.  She states she still has a lot of pyrosis and burning in her chest that bothers her despite taking this.  Her nausea is not as bad as it used to be but can still happen.  She has had a few episodes of vomiting each month.  She had another episode of vomiting up some coffee grounds and what look like old blood to her recently.  No black stools or blood in her stools.  She is otherwise eating okay and denies any early satiety.  She has been taking some Carafate at times which helps.  We had tried to get her Voquezna but she has not had it approved yet.  Recall she has been on Protonix, omeprazole, Nexium, Prevacid in the past.  She does not think the current regimen is helping her.  She has been having some left-sided abdominal pain that can sometimes radiate into her back as well.  Recall she is status post cholecystectomy, has had diarrhea in the past and on cholestyramine but more recently this has not really bother her too much.  She is having solid stools but formed can be altered.  She is on Metamucil but not taking it every day. Recall that she had her last colonoscopy in 2014 by [facility].  She had to be admitted to the hospital for pain after the procedure and concern for perforation, she was told she should never  have a colonoscopy again.  She has historically avoided colonoscopy since then. Had a virtual colonoscopy in August 2021.  No obvious polyps or mass lesions noted, she did have suboptimal distention of the sigmoid colon with diverticulosis.       Prior work-up CT scan 07/26/2017 IMPRESSION: 1. No acute process in the abdomen or pelvis. No evidence of metastatic disease in the abdomen or pelvis. 2.  Aortic Atherosclerosis (ICD10-I70.0). 3. Pelvic floor laxity. 4. Hiatal hernia.   Virtual colonoscopy 02/25/2020 - IMPRESSION: 1. Suboptimal evaluation of the sigmoid, secondary to underdistention. No circumferential mass within this region. More proximally, no evidence of clinically significant colonic polyp or mass. 2. Small hiatal hernia. 3. Aortic Atherosclerosis (ICD10-I70.0).     CT renal stone study 08/18/22: IMPRESSION: Extensive colonic diverticulosis without evidence of diverticulitis.  Moderate-sized hiatal hernia. Umbilical hernia containing fat. No acute intra-abdominal or intrapelvic abnormalities. Aortic Atherosclerosis (ICD10-I70.0).     CT abdomen / pelvis 11/27/22: IMPRESSION: 1. No acute localizing process in the abdomen or pelvis. 2. Colonic diverticulosis without evidence for diverticulitis. 3. Moderate-sized hiatal hernia.   EGD 05/16/2023 for hematemesis and GERD - LA Grade B reflux esophagitis with no bleeding. Biopsied.  - 4 cm hiatal hernia. 31- 35 cm  - Gastritis focal area posterior wall of antrum. Biopsied.  - Gastroesophageal flap valve classified as Hill  Grade II ( fold present, opens with respiration) .  - The examination was otherwise normal.   FINAL DIAGNOSIS       1. Surgical [P], gastric antrum :      GASTRIC ANTRAL MUCOSA WITH FOCAL MILD CHRONIC INACTIVE GASTRITIS AND REACTIVE      EPITHELIAL CHANGES.      FOCAL INCREASED LAMINA PROPRIA EOSINOPHILS (UP TO 100 EOSINOPHILS / HIGH POWER      FIELD).      NO H. PYLORI IDENTIFIED ON H&E STAIN.       NEGATIVE FOR INTESTINAL METAPLASIA OR DYSPLASIA.      SEE NOTE.       2. Surgical [P], distal esophagus :      SQUAMOUS MUCOSA WITH REACTIVE EPITHELIAL CHANGES AND SEPARATED FIBRINOUS      PURULENT EXUDATES, SUGGESTIVE OF REFLUX DISEASE.      NEGATIVE FOR INTESTINAL METAPLASIA OR DYSPLASIA.       Diagnosis Note : There are focal increased lamina propria eosinophils (up to 100      eosinophils / high power filed) with eosinophilic aggregates.The findings could      be associated with medication effect, allergic reaction, or entirely      non-specific.The possibility of eosinophilic gastritis is low but cannot be      entirely excluded.Correlation with clinical information is recommended.   Pancreatic fecal elastase NORMAL in December    Past Medical History:  Diagnosis Date   Acid reflux    Allergy    Anemia    Anxiety    in past   Arthritis    all over   Asthma    Atypical moles    Bronchitis    Bronchospasm    reactive with perfume/cologne and smoke   CAD (coronary artery disease)    patient denies on preop of 10/24    Candida esophagitis (HCC)    Carotid artery calcification, bilateral 04/2016   Carpal tunnel syndrome of left wrist 12/03/2019   Cataract    Cervical stenosis of spine    Chronic insomnia    Complication of anesthesia    shoulder surgery- not all the way asleep   COVID-19 02/04/2021   also 10/3 or 04/20/21 sxs started 04/05/21   Depression    in past   Diverticulitis    Diverticulosis    Dizziness    Elevated liver enzymes    Family history of adverse reaction to anesthesia    sister- has problems with nausea and vomiting    Fatigue    Fatty liver    Generalized headache    migraines   GERD (gastroesophageal reflux disease)    Gross hematuria 08/18/2022   Hereditary and idiopathic peripheral neuropathy 05/11/2015   History of chemotherapy    topical cream for h/o skin CA   History of kidney stones    History of neuropathy    [facility] Dr. Allena Katz  neurology   History of vertigo    Hx of migraines    Hyperlipidemia    Hypothyroidism    Insomnia    Irritable bowel syndrome    LLQ abdominal pain    Melanoma (HCC) 2013   Melanoma (HCC)    x2 stg II melanoma   Neuropathy, peripheral    Peripheral neuropathy    Pneumonia    hx of x 2   PONV (postoperative nausea and vomiting)    Pre-diabetes    Retina disorder    left   SCC (squamous cell carcinoma)  skin    Skin cancer 07/17/2019   SCC left leg s/p mohs   SCC 05/01/18 left medial mid leg, right post mid leg 06/08/18, left mid well 04/10/19  Dr. Emily Filbert derm, Dr. Glennis Brink mohs       Stroke [facility])    ? TIA per Dr V per patient    Thyroid disease    Urine incontinence    Viral URI with cough 06/09/2022   Vitamin D deficiency    Weight gain      Past Surgical History:  Procedure Laterality Date   ABLATION Bilateral    Dr. Wyn Quaker 12/2020 veins laser ablation of both great saphenous veins in a staged fashion.   BILATERAL SALPINGOOPHORECTOMY     ovarian cyst Dr. Dareen Piano and Dr. Allie Dimmer 2008   BREAST SURGERY     in 2014 bx breast calcifications   BREAST SURGERY     augmentation with saline Dr. Karmen Bongo 1995    CHOLECYSTECTOMY N/A 01/28/2015   Procedure: LAPAROSCOPIC CHOLECYSTECTOMY WITH INTRAOPERATIVE CHOLANGIOGRAM;  Surgeon: Chevis Pretty III, MD;  Location: [facility] OR;  Service: General;  Laterality: N/A;   CLOSED MANIPULATION SHOULDER     rt   COLONOSCOPY WITH PROPOFOL N/A 11/20/2012   Procedure: COLONOSCOPY WITH PROPOFOL;  Surgeon: Charolett Bumpers, MD;  Location: [facility];  Service: Endoscopy;  Laterality: N/A;   colonscopy     May 2014   JOINT REPLACEMENT     RTK   KNEE ARTHROSCOPY     left knee miniscus tear repair     MELANOMA EXCISION WITH SENTINEL LYMPH NODE BIOPSY  03/18/2011   Back, nodes neg   OOPHORECTOMY     ROTATOR CUFF REPAIR     left   SHOULDER SURGERY     right and left    SKIN CANCER EXCISION     multiple   TOTAL HIP ARTHROPLASTY     TOTAL  KNEE ARTHROPLASTY     right 2010 and left in 2018    TOTAL KNEE ARTHROPLASTY Left 05/16/2017   Procedure: LEFT TOTAL KNEE ARTHROPLASTY;  Surgeon: Durene Romans, MD;  Location: [facility] ORS;  Service: Orthopedics;  Laterality: Left;  70 mins   TUBAL LIGATION     TUMOR REMOVAL     ovary   Family History  Problem Relation Age of Onset   Hypertension Mother    Stroke Mother    Hyperlipidemia Mother    Heart disease Father    Diabetes Father    Hypertension Father    Hyperlipidemia Father    Early death Father    Diabetes Sister    Heart disease Sister        heart murmur   Arthritis Sister    COPD Sister    Hyperlipidemia Sister    Hypertension Sister    Colon polyps Sister        adenomous   Arthritis Sister    Heart disease Sister    Hyperlipidemia Sister    Hypertension Sister    Cancer Sister    Dementia Sister    Tremor Sister    Stroke Maternal Grandmother    Colon cancer Maternal Grandfather    Cancer Maternal Grandfather    Early death Maternal Grandfather    Early death Paternal Grandmother    Stroke Paternal Grandmother    Heart disease Daughter    Hyperlipidemia Daughter    Hypertension Daughter    Heart disease Son    Hyperlipidemia Son    Alcohol abuse Son  Depression Son    Diabetes Son    Pancreatic cancer Maternal Uncle    Alzheimer's disease Paternal Aunt    Alzheimer's disease Paternal Aunt    Alzheimer's disease Paternal Aunt    Alzheimer's disease Paternal Aunt    Cancer Other        2 paternal uncles and 1 maternal uncle colon cancer   Esophageal cancer Neg Hx    Stomach cancer Neg Hx    Social History   Tobacco Use   Smoking status: Never   Smokeless tobacco: Never  Vaping Use   Vaping status: Never Used  Substance Use Topics   Alcohol use: Yes    Alcohol/week: 1.0 standard drink of alcohol    Types: 1 Cans of beer per week    Comment: once a week beer or wine   Drug use: No   Current Outpatient Medications  Medication Sig  Dispense Refill   albuterol (PROAIR HFA) 108 (90 Base) MCG/ACT inhaler Inhale 1-2 puffs into the lungs every 6 (six) hours as needed for wheezing or shortness of breath. 18 g 11   aspirin 81 MG tablet TAKE 1 TABLET BY MOUTH EVERY DAY     atorvastatin (LIPITOR) 20 MG tablet Take 1 tablet (20 mg total) by mouth daily. 90 tablet 3   Biotin 1000 MCG tablet Take 1,000 mcg by mouth daily.     BREO ELLIPTA 100-25 MCG/ACT AEPB Inhale 1 puff into the lungs daily. 28 each 11   Calcium 600-200 MG-UNIT tablet Take 1 tablet daily by mouth.     Cholecalciferol (VITAMIN D3) 2000 UNITS capsule Take 2,000 Units by mouth daily.     cholestyramine (QUESTRAN) 4 g packet Take 1 packet (4 g total) by mouth 2 (two) times daily as needed. 60 each 1   DULoxetine (CYMBALTA) 30 MG capsule Take 1 capsule (30 mg total) by mouth daily. 30 capsule 2   FOLIC ACID PO Take by mouth daily.     levothyroxine (SYNTHROID) 75 MCG tablet Take 1 tablet (75 mcg total) by mouth daily before breakfast. 90 tablet 3   Magnesium 250 MG TABS Take by mouth.     olmesartan (BENICAR) 5 MG tablet Take 2 tablets (10 mg total) by mouth 2 (two) times daily. 120 tablet 6   Omega-3 Fatty Acids (FISH OIL) 1000 MG CAPS Take 1,000 mg by mouth daily.      ondansetron (ZOFRAN) 4 MG tablet Take 4 mg by mouth every 8 (eight) hours as needed for nausea or vomiting.     pantoprazole (PROTONIX) 40 MG tablet Take 40 mg by mouth 2 (two) times daily.     Potassium (POTASSIMIN PO) Take by mouth.     sucralfate (CARAFATE) 1 g tablet Take 1 tablet (1 g total) by mouth every 8 (eight) hours as needed. Slowly dissolve tablet in 1 tablespoon of distilled water before ingesting 60 tablet 3   vitamin B-12 (CYANOCOBALAMIN) 1000 MCG tablet Take 2,000 mcg by mouth daily. 2 tabs     Vonoprazan Fumarate (VOQUEZNA) 20 MG TABS Take 20 mg by mouth daily. For 8 weeks (Patient not taking: Reported on 09/11/2023) 56 tablet 0   No current facility-administered medications for this  visit.   Allergies  Allergen Reactions   Epinephrine Other (See Comments) and Anaphylaxis    Heart palpitations   Penicillins Swelling and Other (See Comments)    Lip swelling Has patient had a PCN reaction causing immediate rash, facial/tongue/throat swelling, SOB or lightheadedness with hypotension:  Yes Has patient had a PCN reaction causing severe rash involving mucus membranes or skin necrosis: No Has patient had a PCN reaction that required hospitalization No Has patient had a PCN reaction occurring within the last 10 years: No If all of the above answers are "NO", then may proceed with Cephalosporin use.   Other     Black pepper- caused bronchial tubes to start closing    Valium [Diazepam] Other (See Comments)    Other reaction(s): Other (See Comments) Overly sensitive  sedation     Review of Systems: All systems reviewed and negative except where noted in HPI.   Lab Results  Component Value Date   WBC 5.2 08/11/2023   HGB 12.9 08/11/2023   HCT 39.5 08/11/2023   MCV 90 08/11/2023   PLT 222 08/11/2023    Lab Results  Component Value Date   NA 143 08/11/2023   CL 102 08/11/2023   K 4.9 08/11/2023   CO2 24 08/11/2023   BUN 15 08/11/2023   CREATININE 1.15 (H) 08/11/2023   EGFR 48 (L) 08/11/2023   CALCIUM 9.3 08/11/2023   ALBUMIN 4.2 08/11/2023   GLUCOSE 120 (H) 08/11/2023    Lab Results  Component Value Date   ALT 11 08/11/2023   AST 14 08/11/2023   ALKPHOS 77 08/11/2023   BILITOT 1.4 (H) 08/11/2023     Physical Exam: BP 130/68   Pulse 67   Ht 5\' 6"  (1.676 m)   Wt 186 lb (84.4 kg)   BMI 30.02 kg/m  Constitutional: Pleasant,well-developed, female in no acute distress. Neurological: Alert and oriented to person place and time. Psychiatric: Normal mood and affect. Behavior is normal.   ASSESSMENT: 83 y.o. female here for assessment of the following  1. Gastroesophageal reflux disease with esophagitis and hemorrhage   2. Mucosal abnormality of  stomach   3. Hiatal hernia   4. Altered bowel habits    Persistent reflux symptoms over time in the setting of hiatal hernia, she is not responding well to high-dose PPI.  Carafate is helping as needed.  Further, she continues to have intermittent hematemesis.  Previously thought to be due to erosive esophagitis although she also had significant gastritis with increase in eosinophils.  Unclear etiology of that but eosinophilic gastritis possible.  We discussed options.  We have been trying to get her a trial of Voquezna since she has failed numerous PPIs, I have some free samples I can give her today will try 20 mg daily for 2 weeks and see if that provides symptomatic benefit.  She should continue Carafate as needed.  She is to sleep with the head of the bed elevated and avoid eating prior to bedtime.  Given her persistent symptoms and hematemesis, I think repeat EGD is reasonable to assess for mucosal healing of esophagitis and assess if she has eosinophilic gastritis or not based on her last endoscopy which could be confounding this presentation.  She is agreeable to this.  If she has persistent erosive changes and significant reflux that persists, we may need to consider hiatal hernia repair with fundoplication although obviously would hope to avoid surgery given her age and comorbidities.  Otherwise her bowels are doing much better since I have last seen her, continue fiber supplement in fact I think maybe she should take it every day.  If she has tolerance with daily Metamucil she could try Citrucel which is usually better tolerated regimen   PLAN: - samples of Voquezna - 20mg  daily for  2 weeks and if helps will continue - EGD at the [facility] - reassess for mucosal healing of esophagitis and assess for eosinophilic gastritis - if erosive changes / symptoms persist despite aggressive reflux measures, may consider HH repair - continue carafate PRN  - sleep with HOB elevated and avoid eating prior to  bedtime - continue fiber supplement daily  - consider switch to Citrucel if tolerance is an issue with metamucil.  Harlin Rain, MD [facility]

## 2023-09-11 NOTE — Patient Instructions (Addendum)
 Continue Carafate as needed.  Continue Metamucil but if you experience a lot of bloating switch to Citrucel.  Elevate bed for sleeping to help with reflux symptoms.  We have given you samples of the following medication to take: Voquezna 20mg  once daily.  You should be receiving a call from [facility] about your prescription for Voquezna.  You have been scheduled for an endoscopy. Please follow written instructions given to you at your visit today.  If you use inhalers (even only as needed), please bring them with you on the day of your procedure.  If you take any of the following medications, they will need to be adjusted prior to your procedure:   DO NOT TAKE 7 DAYS PRIOR TO TEST- Trulicity (dulaglutide) Ozempic, Wegovy (semaglutide) Mounjaro (tirzepatide) Bydureon Bcise (exanatide extended release)  DO NOT TAKE 1 DAY PRIOR TO YOUR TEST Rybelsus (semaglutide) Adlyxin (lixisenatide) Victoza (liraglutide) Byetta (exanatide) _______________________________________________________________________  Thank you for entrusting me with your care and for choosing [facility], Dr. Ileene Patrick   If your blood pressure at your visit was 140/90 or greater, please contact your primary care physician to follow up on this. ______________________________________________________  If you are age 44 or older, your body mass index should be between 23-30. Your Body mass index is 30.02 kg/m. If this is out of the aforementioned range listed, please consider follow up with your Primary Care Provider.  If you are age 57 or younger, your body mass index should be between 19-25. Your Body mass index is 30.02 kg/m. If this is out of the aformentioned range listed, please consider follow up with your Primary Care Provider.  ________________________________________________________  The [facility] providers would like to encourage you to use [facility] to communicate with providers for  non-urgent requests or questions.  Due to long hold times on the telephone, sending your provider a message by [facility] may be a faster and more efficient way to get a response.  Please allow 48 business hours for a response.  Please remember that this is for non-urgent requests.  _______________________________________________________  Due to recent changes in healthcare laws, you may see the results of your imaging and laboratory studies on MyChart before your provider has had a chance to review them.  We understand that in some cases there may be results that are confusing or concerning to you. Not all laboratory results come back in the same time frame and the provider may be waiting for multiple results in order to interpret others.  Please give [name] 48 hours in order for your provider to thoroughly review all the results before contacting the office for clarification of your results.

## 2023-09-12 ENCOUNTER — Ambulatory Visit: Payer: PPO | Admitting: Gastroenterology

## 2023-09-13 ENCOUNTER — Other Ambulatory Visit: Payer: PPO

## 2023-09-19 ENCOUNTER — Ambulatory Visit (INDEPENDENT_AMBULATORY_CARE_PROVIDER_SITE_OTHER): Admitting: Family

## 2023-09-19 DIAGNOSIS — J069 Acute upper respiratory infection, unspecified: Secondary | ICD-10-CM

## 2023-09-19 LAB — POCT XPERT XPRESS SARS COVID-2/FLU/RSV
FLU A: NEGATIVE
FLU B: NEGATIVE
RSV RNA, PCR: NEGATIVE
SARS Coronavirus 2: NEGATIVE

## 2023-09-19 NOTE — Progress Notes (Signed)
   Subjective   CHIEF COMPLAINT  COVID Testing    REASON FOR VISIT  COVID testing for URI symptoms.        Objective   Results for orders placed or performed in visit on 09/19/23  POCT XPERT XPRESS SARS COVID-2/FLU/RSV  Result Value Ref Range   SARS Coronavirus 2 Negative    FLU A Negative    FLU B Negative    RSV RNA, PCR Negative     Assessment & Plan  1. Acute upper respiratory infection (Primary)  - POCT XPERT XPRESS SARS COVID-2/FLU/RSV   Total time spent: 5 minutes  Miki Kins, FNP 09/19/2023

## 2023-09-19 NOTE — Telephone Encounter (Addendum)
 Patient is requesting a call back wanting an update on Voquezna PA. Patient is requesting medication to be sent to the [facility] on [address].

## 2023-09-20 ENCOUNTER — Other Ambulatory Visit: Payer: PPO

## 2023-09-20 NOTE — Telephone Encounter (Signed)
 Hi. Please see below. [employer] updated the encounter 12 days ago stating it was approved from 09/08/2023-07/17/2024

## 2023-09-20 NOTE — Telephone Encounter (Signed)
 Patient is requesting to speak with someone urgently stating she's on her last pill. Please advise. Thank you.

## 2023-09-21 ENCOUNTER — Other Ambulatory Visit: Payer: Self-pay | Admitting: Internal Medicine

## 2023-09-21 ENCOUNTER — Telehealth: Payer: Self-pay | Admitting: Gastroenterology

## 2023-09-21 MED ORDER — DULOXETINE HCL 60 MG PO CPEP: 60.0000 mg | ORAL_CAPSULE | Freq: Every day | ORAL | 2 refills | Status: DC

## 2023-09-21 NOTE — Telephone Encounter (Signed)
 Patient called states she is experiencing abdominal pain and rashes and a funny feeling in her chest not sure if its from Voquezna medication.

## 2023-09-22 NOTE — Telephone Encounter (Signed)
 Returned call to patient. Pt states that she took Voquezna for about 10 days and it has worked well for her. Pt states that she does not have a rash, but her face has been red for the past 2-3 days. Pt has not started any new medications or changed any face soap/lotions. Chest discomfort that patient experienced went away, she is feeling fine today. Pt did want to follow up on Voquezna prescription since it had been approved from 09/08/23-07/17/24. Patient has not been contacted by [facility]. I advised patient that I will call [facility] today to follow up and will send her a MyChart message with an update. Pt verbalized understanding and had no concerns at the end of hte call.    Called [place] and spoke with [place]. I was informed that there was no update on file that PA had been approved. Derek Mound will escalate this case so that a test claim can be run for pricing. Once test claim is successful they will text patient a link. Patient should be contacted today or within the next 24-28 hours. Patient notified via MyChart.

## 2023-09-27 ENCOUNTER — Encounter: Payer: Self-pay | Admitting: Gastroenterology

## 2023-09-27 DIAGNOSIS — D045 Carcinoma in situ of skin of trunk: Secondary | ICD-10-CM | POA: Diagnosis not present

## 2023-10-04 ENCOUNTER — Ambulatory Visit (INDEPENDENT_AMBULATORY_CARE_PROVIDER_SITE_OTHER)

## 2023-10-04 DIAGNOSIS — Z9189 Other specified personal risk factors, not elsewhere classified: Secondary | ICD-10-CM

## 2023-10-04 DIAGNOSIS — M8589 Other specified disorders of bone density and structure, multiple sites: Secondary | ICD-10-CM | POA: Diagnosis not present

## 2023-10-05 ENCOUNTER — Encounter: Payer: Self-pay | Admitting: Gastroenterology

## 2023-10-05 ENCOUNTER — Ambulatory Visit: Payer: PPO | Admitting: Gastroenterology

## 2023-10-05 VITALS — BP 115/74 | HR 74 | Temp 96.4°F | Resp 16 | Ht 66.0 in | Wt 186.0 lb

## 2023-10-05 DIAGNOSIS — K2101 Gastro-esophageal reflux disease with esophagitis, with bleeding: Secondary | ICD-10-CM | POA: Diagnosis not present

## 2023-10-05 DIAGNOSIS — K3189 Other diseases of stomach and duodenum: Secondary | ICD-10-CM

## 2023-10-05 DIAGNOSIS — K2100 Gastro-esophageal reflux disease with esophagitis, without bleeding: Secondary | ICD-10-CM | POA: Diagnosis not present

## 2023-10-05 DIAGNOSIS — I700 Atherosclerosis of aorta: Secondary | ICD-10-CM | POA: Diagnosis not present

## 2023-10-05 DIAGNOSIS — K449 Diaphragmatic hernia without obstruction or gangrene: Secondary | ICD-10-CM

## 2023-10-05 DIAGNOSIS — K319 Disease of stomach and duodenum, unspecified: Secondary | ICD-10-CM | POA: Diagnosis not present

## 2023-10-05 MED ORDER — SODIUM CHLORIDE 0.9 % IV SOLN: 500.0000 mL | Freq: Once | INTRAVENOUS | Status: DC

## 2023-10-05 NOTE — Op Note (Signed)
 [facility] Patient Name: [name] Procedure Date: 10/05/2023 9:52 AM MRN: [id number] Endoscopist: Viviann Spare P. Adela Lank , MD, [id number] Age: 83 Referring MD:  Date of Birth: 03/12/1941 Gender: Female Account #: [id number] Procedure:                Upper GI endoscopy Indications:              Follow-up of reflux esophagitis and gastritis from                            EGD October. Has hiatal hernia, poorly controlled                            reflux on protonix twice daily. Had samples of                            Voquezna which provided significant relief and                            ordered more, she just got it but has been on PPI                            for last several weeks. 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 Medicines:                Monitored Anesthesia Care Procedure:                Pre-Anesthesia Assessment:                           - Prior to the procedure, a History and Physical                            was performed, and patient medications and                            allergies were reviewed. The patient's tolerance of                            previous anesthesia was also reviewed. The risks                            and benefits of the procedure and the sedation                            options and risks were discussed with the patient.                            All questions were answered, and informed consent  was obtained. Prior Anticoagulants: The patient has                            taken no anticoagulant or antiplatelet agents. ASA                            Grade Assessment: III - A patient with severe                            systemic disease. After reviewing the risks and                            benefits, the patient was deemed in satisfactory                            condition to  undergo the procedure.                           After obtaining informed consent, the endoscope was                            passed under direct vision. Throughout the                            procedure, the patient's blood pressure, pulse, and                            oxygen saturations were monitored continuously. The                            Olympus Scope SN [id number] was introduced through the                            mouth, and advanced to the second part of duodenum.                            The upper GI endoscopy was accomplished without                            difficulty. The patient tolerated the procedure                            well. Scope In: Scope Out: Findings:                 Esophagogastric landmarks were identified: the                            Z-line was found at 30 cm, the gastroesophageal                            junction was found at 30 cm and the upper extent of  the gastric folds was found at 36 cm from the                            incisors.                           A 6 cm hiatal hernia was present.                           LA Grade B esophagitis was found in the distal                            esophagus.                           The exam of the esophagus was otherwise normal.                           The entire examined stomach was normal. Biopsies                            were taken with a cold forceps for histology from                            the antrum and body - rule out eosinophilic                            gastritis based on last EGD findings.                           The examined duodenum was normal. Complications:            No immediate complications. Estimated blood loss:                            Minimal. Estimated Blood Loss:     Estimated blood loss was minimal. Impression:               - Esophagogastric landmarks identified.                           - 6 cm hiatal hernia.                            - LA Grade B reflux esophagitis.                           - Normal stomach. Biopsied.                           - Normal examined duodenum.                           Patient is clearly failing high dose PPI in the                            setting  of hiatal hernia. She will resume Voquezna                            as thas has helped her significantly in the past                            with samples, she just got approved for the                            prescription. If that does not help, may need to                            consider hiatal hernia repair with fundoplication. Recommendation:           - Patient has a contact number available for                            emergencies. The signs and symptoms of potential                            delayed complications were discussed with the                            patient. Return to normal activities tomorrow.                            Written discharge instructions were provided to the                            patient.                           - Resume previous diet.                           - Continue present medications (Voquezna once daily)                           - Await pathology results. Viviann Spare P. Omega Durante, MD 10/05/2023 10:17:40 AM This report has been signed electronically.

## 2023-10-05 NOTE — Progress Notes (Signed)
 To pacu, VSS. Report to [email or web address]

## 2023-10-05 NOTE — Progress Notes (Signed)
 Pt's states no medical or surgical changes since previsit or office visit.

## 2023-10-05 NOTE — Progress Notes (Signed)
  History and Physical Interval Note: Here for EGD to re-evaluate esophagitis and mucosal abnormality of the stomach from EGD in October. Given samples of Voquezna which really helped and prescribed it but took time for it to get approved and she just got it but has not started it. Remains on BID protonix. Agrees to proceed with EGD. No interval changes since office visit.   10/05/2023 9:53 AM  [name]  has presented today for endoscopic procedure(s), with the diagnosis of  Encounter Diagnoses  Name Primary?   Gastroesophageal reflux disease with esophagitis and hemorrhage Yes   Mucosal abnormality of stomach   .  The various methods of evaluation and treatment have been discussed with the patient and/or family. After consideration of risks, benefits and other options for treatment, the patient has consented to  the endoscopic procedure(s).   The patient's history has been reviewed, patient examined, no change in status, stable for surgery.  I have reviewed the patient's chart and labs.  Questions were answered to the patient's satisfaction.    Harlin Rain, MD [facility]

## 2023-10-05 NOTE — Patient Instructions (Signed)

## 2023-10-05 NOTE — Progress Notes (Signed)
 Called to room to assist during endoscopic procedure.  Patient ID and intended procedure confirmed with present staff. Received instructions for my participation in the procedure from the performing physician.

## 2023-10-06 ENCOUNTER — Telehealth: Payer: Self-pay

## 2023-10-06 NOTE — Telephone Encounter (Signed)
  Follow up Call-     10/05/2023    9:07 AM 05/16/2023    9:09 AM  Call back number  Post procedure Call Back phone  # [phone]  Permission to leave phone message Yes Yes     Attempted to call patient regarding follow-up phone call. No answer, VM left.

## 2023-10-09 LAB — SURGICAL PATHOLOGY

## 2023-10-10 ENCOUNTER — Encounter: Payer: Self-pay | Admitting: Gastroenterology

## 2023-10-12 ENCOUNTER — Telehealth: Payer: Self-pay

## 2023-10-12 ENCOUNTER — Other Ambulatory Visit: Payer: Self-pay | Admitting: Internal Medicine

## 2023-10-12 DIAGNOSIS — F324 Major depressive disorder, single episode, in partial remission: Secondary | ICD-10-CM

## 2023-10-12 DIAGNOSIS — F411 Generalized anxiety disorder: Secondary | ICD-10-CM

## 2023-10-12 MED ORDER — DULOXETINE HCL 60 MG PO CPEP: 60.0000 mg | ORAL_CAPSULE | Freq: Every day | ORAL | 2 refills | Status: AC

## 2023-10-12 NOTE — Progress Notes (Signed)
Informed via my chart message

## 2023-10-12 NOTE — Telephone Encounter (Signed)
 Patient called asking for  a refill on her anxiety meds, she didn't state what the medication was and I didn't see anything on her medlist, do you know what medication she's looking for ?

## 2023-10-16 ENCOUNTER — Telehealth: Payer: Self-pay | Admitting: Internal Medicine

## 2023-10-16 ENCOUNTER — Other Ambulatory Visit: Payer: Self-pay

## 2023-10-16 ENCOUNTER — Emergency Department

## 2023-10-16 ENCOUNTER — Emergency Department
Admission: EM | Admit: 2023-10-16 | Discharge: 2023-10-16 | Disposition: A | Discharge status: Home / Self Care | Attending: Emergency Medicine | Admitting: Emergency Medicine

## 2023-10-16 ENCOUNTER — Telehealth: Payer: Self-pay

## 2023-10-16 DIAGNOSIS — I2510 Atherosclerotic heart disease of native coronary artery without angina pectoris: Secondary | ICD-10-CM | POA: Insufficient documentation

## 2023-10-16 DIAGNOSIS — W06XXXA Fall from bed, initial encounter: Secondary | ICD-10-CM | POA: Diagnosis not present

## 2023-10-16 DIAGNOSIS — S0990XA Unspecified injury of head, initial encounter: Secondary | ICD-10-CM | POA: Diagnosis not present

## 2023-10-16 DIAGNOSIS — S0512XA Contusion of eyeball and orbital tissues, left eye, initial encounter: Secondary | ICD-10-CM | POA: Diagnosis not present

## 2023-10-16 DIAGNOSIS — R9082 White matter disease, unspecified: Secondary | ICD-10-CM | POA: Diagnosis not present

## 2023-10-16 DIAGNOSIS — W19XXXA Unspecified fall, initial encounter: Secondary | ICD-10-CM

## 2023-10-16 NOTE — Telephone Encounter (Signed)
 Patient left VM that she fell out of her bed yesterday morning and hit her nightstand. She has a black eye, bruises and scratches. She wants to know if she should go get some imaging done to check for a possible brain bleed or anything else? Please advise.

## 2023-10-16 NOTE — ED Provider Notes (Signed)
 [facility] Provider Note    Event Date/Time   First MD Initiated Contact with Patient 10/16/23 1434     (approximate)  History   Chief Complaint: Fall  HPI  [name] is a 83 y.o. female with a past medical history of an immune, anxiety, CAD, gastric reflux, hyperlipidemia, presents to the emergency department following a fall.  According to the patient 2 nights ago she was sleeping when she had a bad dream and rolled out of bed.  Patient states she hit her head on the ground.  Patient states baby aspirin is her only anticoagulation.  Patient denies LOC.  Patient has noticed today that there appears to be more bruising around the left eye, she also continued have a mild headache she was concerned so she came to the emergency department for evaluation.  Physical Exam   Triage Vital Signs: ED Triage Vitals [10/16/23 1244]  Encounter Vitals Group     BP (!) 144/65     Systolic BP Percentile      Diastolic BP Percentile      Pulse Rate 75     Resp 18     Temp 98 F (36.7 C)     Temp src      SpO2 100 %     Weight 186 lb (84.4 kg)     Height 5\' 6"  (1.676 m)     Head Circumference      Peak Flow      Pain Score 5     Pain Loc      Pain Education      Exclude from Growth Chart     Most recent vital signs: Vitals:   10/16/23 1244  BP: (!) 144/65  Pulse: 75  Resp: 18  Temp: 98 F (36.7 C)  SpO2: 100%    General: Awake, no distress.  CV:  Good peripheral perfusion.  Regular rate and rhythm  Resp:  Normal effort.  Equal breath sounds bilaterally.  Abd:  No distention. Other:  Patient has mild left periorbital ecchymosis with very mild left-sided conjunctival hemorrhage.  No hyphema.   ED Results / Procedures / Treatments   RADIOLOGY  I have reviewed and interpreted CT head images.  No bleed seen on my evaluation. Radiology is read the CT scan of the head C-spine and face is negative for acute abnormality.   MEDICATIONS ORDERED IN  ED: Medications - No data to display   IMPRESSION / MDM / ASSESSMENT AND PLAN / ED COURSE  I reviewed the triage vital signs and the nursing notes.  Patient's presentation is most consistent with acute illness / injury with system symptoms.  Patient presents to the emergency department for evaluation after a fall 2 days ago.  Patient states yesterday around 6 AM she fell out of the bed while having a bad dream.  She has noted some increase in bruising around left eye so she came to the emergency department out of concern.  Patient has mild periorbital ecchymosis which she had a mild hematoma to left forehead.  Patient states after the fall she had much bigger swelling to this area.  Suspect gravity-dependent ecchymosis following a hematoma to the forehead.  Slight subconjunctival hemorrhage to the lateral left eye.  Patient denies any visual changes.  CT scans are negative for acute abnormality.  Given the patient's reassuring CT scans, reassuring physical exam I believe the patient is safe for discharge home with outpatient follow-up.  I discussed  with the patient Tylenol or ibuprofen as needed for discomfort and following up with her doctor.  Discussed return precautions for any visual changes.  FINAL CLINICAL IMPRESSION(S) / ED DIAGNOSES   Fall Head injury   Note:  This document was prepared using Dragon voice recognition software and may include unintentional dictation errors.   Minna Antis, MD 10/16/23 [phone]

## 2023-10-16 NOTE — Telephone Encounter (Signed)
 Patient LM stating that she fell out of the bed last night and hit her head and hip, she has a black eye L side, she wants to make sure she doesn't have bleeding in her head, should I send her to the ED to be evaluated?

## 2023-10-16 NOTE — Telephone Encounter (Signed)
 Patient informed.

## 2023-10-16 NOTE — Discharge Instructions (Addendum)
 You have been seen in the emergency department following a head injury.  Your workup showed reassuring results.  Please follow-up with your primary care doctor in the next 2 to 3 days for recheck/reevaluation.  Return to the emergency department for any visual changes or any other symptom personally concerning to yourself.

## 2023-10-16 NOTE — ED Triage Notes (Addendum)
 Pt comes with fall out of bed. Pt was having nightmare and fell. Pt has bruising noted to left eye. Pt states this happened yesterday but seems worse today. Pt is not on thinners and no loc.

## 2023-10-17 ENCOUNTER — Telehealth: Payer: Self-pay

## 2023-10-17 NOTE — Transitions of Care (Post Inpatient/ED Visit) (Unsigned)
   10/17/2023  Name: [name] MRN: [id number] DOB: 01-27-1941  Today's TOC FU Call Status: Today's TOC FU Call Status:: Unsuccessful Call (1st Attempt) Unsuccessful Call (1st Attempt) Date: 10/17/23  Attempted to reach the patient regarding the most recent Inpatient/ED visit.  Follow Up Plan: Additional outreach attempts will be made to reach the patient to complete the Transitions of Care (Post Inpatient/ED visit) call.   Signature  Karena Addison, LPN [facility] Nurse Health Advisor Direct Dial [phone]

## 2023-10-18 NOTE — Transitions of Care (Post Inpatient/ED Visit) (Unsigned)
   10/18/2023  Name: [name] MRN: [id number] DOB: 1941-04-30  Today's TOC FU Call Status: Today's TOC FU Call Status:: Unsuccessful Call (2nd Attempt) Unsuccessful Call (1st Attempt) Date: 10/17/23 Unsuccessful Call (2nd Attempt) Date: 10/18/23  Attempted to reach the patient regarding the most recent Inpatient/ED visit.  Follow Up Plan: Additional outreach attempts will be made to reach the patient to complete the Transitions of Care (Post Inpatient/ED visit) call.   Signature Karena Addison, LPN [facility] Nurse Health Advisor Direct Dial [phone]

## 2023-10-19 ENCOUNTER — Ambulatory Visit (INDEPENDENT_AMBULATORY_CARE_PROVIDER_SITE_OTHER): Admitting: Internal Medicine

## 2023-10-19 ENCOUNTER — Encounter: Payer: Self-pay | Admitting: Internal Medicine

## 2023-10-19 VITALS — BP 132/76 | HR 76 | Ht 66.0 in | Wt 186.0 lb

## 2023-10-19 DIAGNOSIS — E538 Deficiency of other specified B group vitamins: Secondary | ICD-10-CM | POA: Diagnosis not present

## 2023-10-19 DIAGNOSIS — I10 Essential (primary) hypertension: Secondary | ICD-10-CM | POA: Diagnosis not present

## 2023-10-19 DIAGNOSIS — K219 Gastro-esophageal reflux disease without esophagitis: Secondary | ICD-10-CM

## 2023-10-19 DIAGNOSIS — F324 Major depressive disorder, single episode, in partial remission: Secondary | ICD-10-CM | POA: Insufficient documentation

## 2023-10-19 DIAGNOSIS — E782 Mixed hyperlipidemia: Secondary | ICD-10-CM

## 2023-10-19 DIAGNOSIS — F411 Generalized anxiety disorder: Secondary | ICD-10-CM | POA: Diagnosis not present

## 2023-10-19 DIAGNOSIS — Z9181 History of falling: Secondary | ICD-10-CM | POA: Diagnosis not present

## 2023-10-19 NOTE — Progress Notes (Signed)
 Established Patient Office Visit  Subjective:  Patient ID: [name], female    DOB: April 23, 1941  Age: 83 y.o. MRN: [id number]  Chief Complaint  Patient presents with   Hospitalization Follow-up    Patient comes in for her ED visit from 09/28/2023.  Patient reports that she was dreaming at night and fell off the bed, face down.  She suffered from multiple bruising and swelling involving her left eye forehead and scalp.  She went to the emergency room where imaging was done which was entirely negative.  Today she is feeling better but still has multiple bruises on the face.  Patient mentions that she gets vivid dreams at least once a year and she tries to act them out.  She was recently started on Cymbalta and was doing well at 30 mg.  Her dose was increased to 60 and she is feeling better on it, however will monitor if she gets more dreams.No other complaints.    No other concerns at this time.   Past Medical History:  Diagnosis Date   Acid reflux    Allergy    Anemia    Anxiety    in past   Arthritis    all over   Asthma    Atypical moles    Bronchitis    Bronchospasm    reactive with perfume/cologne and smoke   CAD (coronary artery disease)    patient denies on preop of 10/24    Candida esophagitis (HCC)    Carotid artery calcification, bilateral 04/2016   Carpal tunnel syndrome of left wrist 12/03/2019   Cataract    Cervical stenosis of spine    Chronic insomnia    Complication of anesthesia    shoulder surgery- not all the way asleep   COVID-19 02/04/2021   also 10/3 or 04/20/21 sxs started 04/05/21   Depression    in past   Diverticulitis    Diverticulosis    Dizziness    Elevated liver enzymes    Family history of adverse reaction to anesthesia    sister- has problems with nausea and vomiting    Fatigue    Fatty liver    Generalized headache    migraines   GERD (gastroesophageal reflux disease)    Gross hematuria 08/18/2022   Hereditary and idiopathic  peripheral neuropathy 05/11/2015   History of chemotherapy    topical cream for h/o skin CA   History of kidney stones    History of neuropathy    FH Dr. Allena Katz neurology   History of vertigo    Hx of migraines    Hyperlipidemia    Hypothyroidism    Insomnia    Irritable bowel syndrome    LLQ abdominal pain    Melanoma (HCC) 2013   Melanoma (HCC)    x2 stg II melanoma   Neuropathy, peripheral    Peripheral neuropathy    Pneumonia    hx of x 2   PONV (postoperative nausea and vomiting)    Pre-diabetes    Retina disorder    left   SCC (squamous cell carcinoma)    skin    Skin cancer 07/17/2019   SCC left leg s/p mohs   SCC 05/01/18 left medial mid leg, right post mid leg 06/08/18, left mid well 04/10/19  Dr. Emily Filbert derm, Dr. Glennis Brink mohs       Stroke [facility])    ? TIA per Dr V per patient    Thyroid disease  Urine incontinence    Viral URI with cough 06/09/2022   Vitamin D deficiency    Weight gain     Past Surgical History:  Procedure Laterality Date   ABLATION Bilateral    Dr. Wyn Quaker 12/2020 veins laser ablation of both great saphenous veins in a staged fashion.   BILATERAL SALPINGOOPHORECTOMY     ovarian cyst Dr. Dareen Piano and Dr. Allie Dimmer 2008   BREAST SURGERY     in 2014 bx breast calcifications   BREAST SURGERY     augmentation with saline Dr. Karmen Bongo 1995    CHOLECYSTECTOMY N/A 01/28/2015   Procedure: LAPAROSCOPIC CHOLECYSTECTOMY WITH INTRAOPERATIVE CHOLANGIOGRAM;  Surgeon: Chevis Pretty III, MD;  Location: [facility] OR;  Service: General;  Laterality: N/A;   CLOSED MANIPULATION SHOULDER     rt   COLONOSCOPY WITH PROPOFOL N/A 11/20/2012   Procedure: COLONOSCOPY WITH PROPOFOL;  Surgeon: Charolett Bumpers, MD;  Location: [facility];  Service: Endoscopy;  Laterality: N/A;   colonscopy     May 2014   JOINT REPLACEMENT     RTK   KNEE ARTHROSCOPY     left knee miniscus tear repair     MELANOMA EXCISION WITH SENTINEL LYMPH NODE BIOPSY  03/18/2011   Back, nodes neg    OOPHORECTOMY     ROTATOR CUFF REPAIR     left   SHOULDER SURGERY     right and left    SKIN CANCER EXCISION     multiple   TOTAL HIP ARTHROPLASTY     TOTAL KNEE ARTHROPLASTY     right 2010 and left in 2018    TOTAL KNEE ARTHROPLASTY Left 05/16/2017   Procedure: LEFT TOTAL KNEE ARTHROPLASTY;  Surgeon: Durene Romans, MD;  Location: [facility];  Service: Orthopedics;  Laterality: Left;  70 mins   TUBAL LIGATION     TUMOR REMOVAL     ovary    Social History   Socioeconomic History   Marital status: Divorced    Spouse name: Not on file   Number of children: 3   Years of education: College   Highest education level: Some college, no degree  Occupational History   Occupation: Retired    Associate Professor: RETIRED  Tobacco Use   Smoking status: Never   Smokeless tobacco: Never  Vaping Use   Vaping status: Never Used  Substance and Sexual Activity   Alcohol use: Yes    Alcohol/week: 1.0 standard drink of alcohol    Types: 1 Cans of beer per week    Comment: once a week beer or wine   Drug use: No   Sexual activity: Not Currently  Other Topics Concern   Not on file  Social History Narrative   Patient lives at home alone in a one story home.   Has 3 children (2 daughters and 1 son)    Retired from [employer].   Divorced since 1992    Caffeine Use: 1 cup daily and a soft drink occasionally    No guns    Wears seat belt     Lives with cat    Left handed   Social Drivers of Health   Financial Resource Strain: Low Risk  (04/18/2023)   Overall Financial Resource Strain (CARDIA)    Difficulty of Paying Living Expenses: Not hard at all  Food Insecurity: No Food Insecurity (04/18/2023)   Hunger Vital Sign    Worried About Running Out of Food in the Last Year: Never true    Ran Out  of Food in the Last Year: Never true  Transportation Needs: No Transportation Needs (04/18/2023)   PRAPARE - Administrator, Civil Service (Medical): No    Lack of Transportation  (Non-Medical): No  Physical Activity: Inactive (04/18/2023)   Exercise Vital Sign    Days of Exercise per Week: 0 days    Minutes of Exercise per Session: 0 min  Stress: Stress Concern Present (04/18/2023)   [employer] of Occupational Health - Occupational Stress Questionnaire    Feeling of Stress : To some extent  Social Connections: Moderately Integrated (04/18/2023)   Social Connection and Isolation Panel [NHANES]    Frequency of Communication with Friends and Family: More than three times a week    Frequency of Social Gatherings with Friends and Family: Three times a week    Attends Religious Services: More than 4 times per year    Active Member of Clubs or Organizations: Yes    Attends Banker Meetings: More than 4 times per year    Marital Status: Divorced  Intimate Partner Violence: Not At Risk (04/18/2023)   Humiliation, Afraid, Rape, and Kick questionnaire    Fear of Current or Ex-Partner: No    Emotionally Abused: No    Physically Abused: No    Sexually Abused: No    Family History  Problem Relation Age of Onset   Hypertension Mother    Stroke Mother    Hyperlipidemia Mother    Heart disease Father    Diabetes Father    Hypertension Father    Hyperlipidemia Father    Early death Father    Diabetes Sister    Heart disease Sister        heart murmur   Arthritis Sister    COPD Sister    Hyperlipidemia Sister    Hypertension Sister    Colon polyps Sister        adenomous   Arthritis Sister    Heart disease Sister    Hyperlipidemia Sister    Hypertension Sister    Cancer Sister    Dementia Sister    Tremor Sister    Stroke Maternal Grandmother    Colon cancer Maternal Grandfather    Cancer Maternal Grandfather    Early death Maternal Grandfather    Early death Paternal Grandmother    Stroke Paternal Grandmother    Heart disease Daughter    Hyperlipidemia Daughter    Hypertension Daughter    Heart disease Son    Hyperlipidemia Son     Alcohol abuse Son    Depression Son    Diabetes Son    Pancreatic cancer Maternal Uncle    Alzheimer's disease Paternal Aunt    Alzheimer's disease Paternal Aunt    Alzheimer's disease Paternal Aunt    Alzheimer's disease Paternal Aunt    Cancer Other        2 paternal uncles and 1 maternal uncle colon cancer   Esophageal cancer Neg Hx    Stomach cancer Neg Hx     Allergies  Allergen Reactions   Epinephrine Other (See Comments) and Anaphylaxis    Heart palpitations   Other Anaphylaxis    Black pepper- caused bronchial tubes to start closing    Penicillins Swelling and Other (See Comments)    Lip swelling Has patient had a PCN reaction causing immediate rash, facial/tongue/throat swelling, SOB or lightheadedness with hypotension: Yes Has patient had a PCN reaction causing severe rash involving mucus membranes or skin necrosis: No Has  patient had a PCN reaction that required hospitalization No Has patient had a PCN reaction occurring within the last 10 years: No If all of the above answers are "NO", then may proceed with Cephalosporin use.   Valium [Diazepam] Other (See Comments)    Other reaction(s): Other (See Comments) Overly sensitive  sedation    Outpatient Medications Prior to Visit  Medication Sig   albuterol (PROAIR HFA) 108 (90 Base) MCG/ACT inhaler Inhale 1-2 puffs into the lungs every 6 (six) hours as needed for wheezing or shortness of breath.   aspirin 81 MG tablet TAKE 1 TABLET BY MOUTH EVERY DAY   atorvastatin (LIPITOR) 20 MG tablet Take 1 tablet (20 mg total) by mouth daily.   Biotin 1000 MCG tablet Take 1,000 mcg by mouth daily.   BREO ELLIPTA 100-25 MCG/ACT AEPB Inhale 1 puff into the lungs daily.   Calcium 600-200 MG-UNIT tablet Take 1 tablet daily by mouth.   Cholecalciferol (VITAMIN D3) 2000 UNITS capsule Take 2,000 Units by mouth daily.   cholestyramine (QUESTRAN) 4 g packet Take 1 packet (4 g total) by mouth 2 (two) times daily as needed.   clindamycin  (CLEOCIN) 150 MG capsule Take 150 mg by mouth 4 (four) times daily.   DULoxetine (CYMBALTA) 60 MG capsule Take 1 capsule (60 mg total) by mouth daily.   FOLIC ACID PO Take by mouth daily.   levothyroxine (SYNTHROID) 75 MCG tablet Take 1 tablet (75 mcg total) by mouth daily before breakfast.   Magnesium 250 MG TABS Take by mouth.   olmesartan (BENICAR) 5 MG tablet Take 2 tablets (10 mg total) by mouth 2 (two) times daily.   Omega-3 Fatty Acids (FISH OIL) 1000 MG CAPS Take 1,000 mg by mouth daily.    ondansetron (ZOFRAN) 4 MG tablet Take 4 mg by mouth every 8 (eight) hours as needed for nausea or vomiting.   pantoprazole (PROTONIX) 40 MG tablet Take 40 mg by mouth 2 (two) times daily.   Potassium (POTASSIMIN PO) Take by mouth.   sucralfate (CARAFATE) 1 g tablet Take 1 tablet (1 g total) by mouth every 8 (eight) hours as needed. Slowly dissolve tablet in 1 tablespoon of distilled water before ingesting   vitamin B-12 (CYANOCOBALAMIN) 1000 MCG tablet Take 2,000 mcg by mouth daily. 2 tabs   Vonoprazan Fumarate (VOQUEZNA) 20 MG TABS Take 20 mg by mouth daily. For 8 weeks   [DISCONTINUED] Vonoprazan Fumarate (VOQUEZNA) 20 MG TABS Take 20 mg by mouth daily. (Patient not taking: Reported on 10/19/2023)   No facility-administered medications prior to visit.    Review of Systems  Constitutional: Negative.  Negative for chills, fever, malaise/fatigue and weight loss.  HENT: Negative.  Negative for congestion and nosebleeds.   Eyes: Negative.   Respiratory: Negative.  Negative for cough and shortness of breath.   Cardiovascular: Negative.  Negative for chest pain, palpitations and leg swelling.  Gastrointestinal: Negative.  Negative for abdominal pain, constipation, diarrhea, heartburn, nausea and vomiting.  Genitourinary: Negative.  Negative for dysuria and flank pain.  Musculoskeletal: Negative.  Negative for joint pain and myalgias.  Skin: Negative.   Neurological: Negative.  Negative for dizziness,  tingling, tremors and headaches.  Endo/Heme/Allergies: Negative.   Psychiatric/Behavioral: Negative.  Negative for depression and suicidal ideas. The patient is not nervous/anxious.        Objective:   BP 132/76   Pulse 76   Ht 5\' 6"  (1.676 m)   Wt 186 lb (84.4 kg)   SpO2 97%  BMI 30.02 kg/m   Vitals:   10/19/23 1123  BP: 132/76  Pulse: 76  Height: 5\' 6"  (1.676 m)  Weight: 186 lb (84.4 kg)  SpO2: 97%  BMI (Calculated): 30.04    Physical Exam Vitals and nursing note reviewed.  Constitutional:      Appearance: Normal appearance.  HENT:     Head: Normocephalic and atraumatic.     Nose: Nose normal.     Mouth/Throat:     Mouth: Mucous membranes are moist.     Pharynx: Oropharynx is clear.  Eyes:     Conjunctiva/sclera: Conjunctivae normal.     Pupils: Pupils are equal, round, and reactive to light.  Cardiovascular:     Rate and Rhythm: Normal rate and regular rhythm.     Pulses: Normal pulses.     Heart sounds: Normal heart sounds. No murmur heard. Pulmonary:     Effort: Pulmonary effort is normal.     Breath sounds: Normal breath sounds. No wheezing.  Abdominal:     General: Bowel sounds are normal.     Palpations: Abdomen is soft.     Tenderness: There is no abdominal tenderness. There is no right CVA tenderness or left CVA tenderness.  Musculoskeletal:        General: Normal range of motion.     Cervical back: Normal range of motion.     Right lower leg: No edema.     Left lower leg: No edema.  Skin:    General: Skin is warm and dry.  Neurological:     General: No focal deficit present.     Mental Status: She is alert and oriented to person, place, and time.  Psychiatric:        Mood and Affect: Mood normal.        Behavior: Behavior normal.      No results found for any visits on 10/19/23.  Recent Results (from the past 2160 hours)  POCT Urinalysis Dipstick (16109)     Status: None   Collection Time: 08/10/23 11:48 AM  Result Value Ref Range    Color, UA yellow    Clarity, UA clear    Glucose, UA Negative Negative   Bilirubin, UA neg    Ketones, UA neg    Spec Grav, UA 1.020 1.010 - 1.025   Blood, UA neg    pH, UA 5.5 5.0 - 8.0   Protein, UA Negative Negative   Urobilinogen, UA 0.2 0.2 or 1.0 E.U./dL   Nitrite, UA neg    Leukocytes, UA Negative Negative   Appearance clear    Odor no   CBC with Diff     Status: None   Collection Time: 08/11/23  9:02 AM  Result Value Ref Range   WBC 5.2 3.4 - 10.8 x10E3/uL   RBC 4.41 3.77 - 5.28 x10E6/uL   Hemoglobin 12.9 11.1 - 15.9 g/dL   Hematocrit 60.4 54.0 - 46.6 %   MCV 90 79 - 97 fL   MCH 29.3 26.6 - 33.0 pg   MCHC 32.7 31.5 - 35.7 g/dL   RDW 98.1 19.1 - 47.8 %   Platelets 222 150 - 450 x10E3/uL   Neutrophils 50 Not Estab. %   Lymphs 35 Not Estab. %   Monocytes 10 Not Estab. %   Eos 4 Not Estab. %   Basos 1 Not Estab. %   Neutrophils Absolute 2.7 1.4 - 7.0 x10E3/uL   Lymphocytes Absolute 1.8 0.7 - 3.1 x10E3/uL   Monocytes Absolute  0.5 0.1 - 0.9 x10E3/uL   EOS (ABSOLUTE) 0.2 0.0 - 0.4 x10E3/uL   Basophils Absolute 0.0 0.0 - 0.2 x10E3/uL   Immature Granulocytes 0 Not Estab. %   Immature Grans (Abs) 0.0 0.0 - 0.1 x10E3/uL  CMP14+EGFR     Status: Abnormal   Collection Time: 08/11/23  9:02 AM  Result Value Ref Range   Glucose 120 (H) 70 - 99 mg/dL   BUN 15 8 - 27 mg/dL   Creatinine, Ser 8.65 (H) 0.57 - 1.00 mg/dL   eGFR 48 (L) >78 IO/NGE/9.52   BUN/Creatinine Ratio 13 12 - 28   Sodium 143 134 - 144 mmol/L   Potassium 4.9 3.5 - 5.2 mmol/L   Chloride 102 96 - 106 mmol/L   CO2 24 20 - 29 mmol/L   Calcium 9.3 8.7 - 10.3 mg/dL   Total Protein 6.2 6.0 - 8.5 g/dL   Albumin 4.2 3.7 - 4.7 g/dL   Globulin, Total 2.0 1.5 - 4.5 g/dL   Bilirubin Total 1.4 (H) 0.0 - 1.2 mg/dL   Alkaline Phosphatase 77 44 - 121 IU/L   AST 14 0 - 40 IU/L   ALT 11 0 - 32 IU/L  Lipid Panel w/o Chol/HDL Ratio     Status: None   Collection Time: 08/11/23  9:02 AM  Result Value Ref Range    Cholesterol, Total 151 100 - 199 mg/dL   Triglycerides 84 0 - 149 mg/dL   HDL 78 >84 mg/dL   VLDL Cholesterol Cal 16 5 - 40 mg/dL   LDL Chol Calc (NIH) 57 0 - 99 mg/dL  XLK+[id number]+[id number]     Status: None   Collection Time: 08/11/23  9:02 AM  Result Value Ref Range   TSH 2.550 0.450 - 4.500 uIU/mL   T3, Free 2.9 2.0 - 4.4 pg/mL   Free T4 1.37 0.82 - 1.77 ng/dL  Vitamin D66     Status: None   Collection Time: 08/11/23  9:02 AM  Result Value Ref Range   Vitamin B-12 964 232 - 1,245 pg/mL  POCT XPERT XPRESS SARS COVID-2/FLU/RSV     Status: None   Collection Time: 09/19/23 11:50 AM  Result Value Ref Range   SARS Coronavirus 2 Negative    FLU A Negative    FLU B Negative    RSV RNA, PCR Negative   Surgical pathology ([facility])     Status: None   Collection Time: 10/05/23 12:00 AM  Result Value Ref Range   SURGICAL PATHOLOGY      SURGICAL PATHOLOGY [facility] [address] [place], [place] [address] Telephone [phone] or [phone] Fax [phone]  REPORT OF SURGICAL PATHOLOGY   Accession #: [id number] Patient Name: EMSLEE, LOPEZMARTINEZ Visit # : [id number]  MRN: [id number] Physician: Ileene Patrick DOB/Age [birth year] (Age: 38) Gender: F Collected Date: 10/05/2023 Received Date: 10/06/2023  FINAL DIAGNOSIS       1. Surgical [P], gastric :       - MILD REACTIVE GASTROPATHY.      - NEGATIVE FOR H. PYLORI ON H&E STAIN      - NO INTESTINAL METAPLASIA, DYSPLASIA, OR MALIGNANCY.       DATE SIGNED OUT: 10/09/2023 ELECTRONIC SIGNATURE Dupo Callas M.D., Nupur, Pathologist, Electronic Signature  MICROSCOPIC DESCRIPTION  CASE COMMENTS STAINS USED IN DIAGNOSIS: H&E    CLINICAL HISTORY  SPECIMEN(S) OBTAINED 1. Surgical [P], Gastric  SPECIMEN COMMENTS: 1. Gastroesophageal reflux disease with esophagitis and hemorrhage; mucosal abnormality of sto  mach SPECIMEN CLINICAL INFORMATION: 1. R/O H. pylori    Gross Description 1.  Received in formalin are tan, soft tissue fragments that are submitted in toto. Number: 2 Size: 0.4 cm, (1B) ( TA )        Report signed out from the following location(s) [place]. [facility] [address], [place] [address] CLIA #: [id number]  [facility] [address] [place], [place] [address] CLIA #: [id number]       Assessment & Plan:  Patient advised to be careful at night .Will consider reducing Cymbalta to 30 mg if needed. Problem List Items Addressed This Visit     Hyperlipidemia   GERD (gastroesophageal reflux disease)   Essential hypertension, benign - Primary   Vitamin B12 deficiency   GAD (generalized anxiety disorder)   Major depressive disorder with single episode, in partial remission (HCC)    Follow up as needed.   Total time spent: 30 minutes  Margaretann Loveless, MD  10/19/2023   This document may have been prepared by [facility] Voice Recognition software and as such may include unintentional dictation errors.

## 2023-10-19 NOTE — Transitions of Care (Post Inpatient/ED Visit) (Signed)
   10/19/2023  Name: [name] MRN: [id number] DOB: [birth year]  Today's TOC FU Call Status: Today's TOC FU Call Status:: Unsuccessful Call (3rd Attempt) Unsuccessful Call (1st Attempt) Date: 10/17/23 Unsuccessful Call (2nd Attempt) Date: 10/18/23 Unsuccessful Call (3rd Attempt) Date: 10/19/23  Attempted to reach the patient regarding the most recent Inpatient/ED visit.  Follow Up Plan: No further outreach attempts will be made at this time. We have been unable to contact the patient.  Signature Karena Addison, LPN [facility] Nurse Health Advisor Direct Dial [phone]

## 2023-10-25 ENCOUNTER — Telehealth: Payer: Self-pay | Admitting: Gastroenterology

## 2023-10-25 NOTE — Telephone Encounter (Signed)
 I have spoken to patient to advise that she may continue sucralfate as needed. Patient states that so far, Voquezna is working Adult nurse and she has not needed sucralfate, but she just wanted to confirm she was no longer supposed to be taking the sucralfate on a scheduled basis. Patient also states that [facility] sent her only #30 tablets of Voquezna vs the 56 tablets we sent as a script. States she paid 200 dollars for what she thought was the 2 months worth of tablets. I asked that she reach out to [facility] to inquire further. She states she will do this.

## 2023-10-25 NOTE — Telephone Encounter (Signed)
 Patient called and stated that she would like to know if she should continuing taking Sucralfate. Patient is requesting a call back. Please advise.

## 2023-10-30 ENCOUNTER — Encounter: Payer: Self-pay | Admitting: Internal Medicine

## 2023-10-30 ENCOUNTER — Ambulatory Visit (INDEPENDENT_AMBULATORY_CARE_PROVIDER_SITE_OTHER): Admitting: Podiatry

## 2023-10-30 ENCOUNTER — Ambulatory Visit (INDEPENDENT_AMBULATORY_CARE_PROVIDER_SITE_OTHER): Admitting: Internal Medicine

## 2023-10-30 DIAGNOSIS — Z91198 Patient's noncompliance with other medical treatment and regimen for other reason: Secondary | ICD-10-CM

## 2023-10-30 DIAGNOSIS — R350 Frequency of micturition: Secondary | ICD-10-CM | POA: Diagnosis not present

## 2023-10-30 LAB — POCT URINALYSIS DIPSTICK
Bilirubin, UA: NEGATIVE
Blood, UA: NEGATIVE
Glucose, UA: NEGATIVE
Leukocytes, UA: NEGATIVE
Nitrite, UA: NEGATIVE
Protein, UA: NEGATIVE
Spec Grav, UA: 1.015 (ref 1.010–1.025)
Urobilinogen, UA: 0.2 U/dL
pH, UA: 5.5 (ref 5.0–8.0)

## 2023-10-30 NOTE — Progress Notes (Signed)
Urine dipstick is negative.

## 2023-10-30 NOTE — Progress Notes (Signed)
 1. Failure to attend appointment with reason given    Scheduling conflict/need to reschedule per patient.

## 2023-10-30 NOTE — Progress Notes (Signed)
 Patient notified

## 2023-10-31 ENCOUNTER — Ambulatory Visit: Admitting: Internal Medicine

## 2023-11-02 ENCOUNTER — Ambulatory Visit: Admitting: Cardiology

## 2023-11-02 NOTE — Telephone Encounter (Signed)
 Inbound call from patient, would like to speak with [name] in regards to Voquenza. She states she is having side effects and would like to know if she has a list for them. Did not wish to speak further.

## 2023-11-06 NOTE — Telephone Encounter (Signed)
 Left message for patient to call back

## 2023-11-07 ENCOUNTER — Encounter: Payer: Self-pay | Admitting: Internal Medicine

## 2023-11-07 ENCOUNTER — Ambulatory Visit (INDEPENDENT_AMBULATORY_CARE_PROVIDER_SITE_OTHER): Admitting: Internal Medicine

## 2023-11-07 VITALS — BP 108/60 | HR 75 | Ht 66.0 in | Wt 185.0 lb

## 2023-11-07 DIAGNOSIS — M545 Low back pain, unspecified: Secondary | ICD-10-CM

## 2023-11-07 DIAGNOSIS — F411 Generalized anxiety disorder: Secondary | ICD-10-CM

## 2023-11-07 DIAGNOSIS — R350 Frequency of micturition: Secondary | ICD-10-CM

## 2023-11-07 DIAGNOSIS — E782 Mixed hyperlipidemia: Secondary | ICD-10-CM | POA: Diagnosis not present

## 2023-11-07 DIAGNOSIS — K219 Gastro-esophageal reflux disease without esophagitis: Secondary | ICD-10-CM

## 2023-11-07 DIAGNOSIS — I10 Essential (primary) hypertension: Secondary | ICD-10-CM | POA: Diagnosis not present

## 2023-11-07 LAB — POCT URINALYSIS DIPSTICK
Bilirubin, UA: NEGATIVE
Blood, UA: NEGATIVE
Glucose, UA: NEGATIVE
Ketones, UA: NEGATIVE
Nitrite, UA: NEGATIVE
Protein, UA: NEGATIVE
Spec Grav, UA: 1.015 (ref 1.010–1.025)
Urobilinogen, UA: 0.2 U/dL
pH, UA: 5.5 (ref 5.0–8.0)

## 2023-11-07 NOTE — Telephone Encounter (Signed)
 Left message for patient to call back

## 2023-11-07 NOTE — Progress Notes (Signed)
 Established Patient Office Visit  Subjective:  Patient ID: [name], female    DOB: September 03, 1940  Age: 83 y.o. MRN: [id number]  Chief Complaint  Patient presents with   Follow-up    Discuss medications and tiredness    Patient comes in with complaints of bilateral lower back pain.  She notes that especially in the morning when she wakes up and gets out of bed.  Thinking maybe it could be her mattress and she needs to change it.  Pain lasts up to 1 to 2 hours.  And may respond to Tylenol .  However she is concerned that it could be related to her kidneys.  Her recent urine dipsticks were negative.  However she does show small amount of pus cells today.  She denies nausea or vomiting, no fevers and no chills.  Will send it to the lab for culture and sensitivity and empirically start her on Macrobid .    No other concerns at this time.   Past Medical History:  Diagnosis Date   Acid reflux    Allergy    Anemia    Anxiety    in past   Arthritis    all over   Asthma    Atypical moles    Bronchitis    Bronchospasm    reactive with perfume/cologne and smoke   CAD (coronary artery disease)    patient denies on preop of 10/24    Candida esophagitis (HCC)    Carotid artery calcification, bilateral 04/2016   Carpal tunnel syndrome of left wrist 12/03/2019   Cataract    Cervical stenosis of spine    Chronic insomnia    Complication of anesthesia    shoulder surgery- not all the way asleep   COVID-19 02/04/2021   also 10/3 or 04/20/21 sxs started 04/05/21   Depression    in past   Diverticulitis    Diverticulosis    Dizziness    Elevated liver enzymes    Family history of adverse reaction to anesthesia    sister- has problems with nausea and vomiting    Fatigue    Fatty liver    Generalized headache    migraines   GERD (gastroesophageal reflux disease)    Gross hematuria 08/18/2022   Hereditary and idiopathic peripheral neuropathy 05/11/2015   History of chemotherapy     topical cream for h/o skin CA   History of kidney stones    History of neuropathy    FH Dr. Lydia Sams neurology   History of vertigo    Hx of migraines    Hyperlipidemia    Hypothyroidism    Insomnia    Irritable bowel syndrome    LLQ abdominal pain    Melanoma (HCC) 2013   Melanoma (HCC)    x2 stg II melanoma   Neuropathy, peripheral    Peripheral neuropathy    Pneumonia    hx of x 2   PONV (postoperative nausea and vomiting)    Pre-diabetes    Retina disorder    left   SCC (squamous cell carcinoma)    skin    Skin cancer 07/17/2019   SCC left leg s/p mohs   SCC 05/01/18 left medial mid leg, right post mid leg 06/08/18, left mid well 04/10/19  Dr. Joanne Muckle derm, Dr. Eva Hikes mohs       Stroke [facility])    ? TIA per Dr V per patient    Thyroid  disease    Urine incontinence  Viral URI with cough 06/09/2022   Vitamin D  deficiency    Weight gain     Past Surgical History:  Procedure Laterality Date   ABLATION Bilateral    Dr. Vonna Guardian 12/2020 veins laser ablation of both great saphenous veins in a staged fashion.   BILATERAL SALPINGOOPHORECTOMY     ovarian cyst Dr. Alva Jewels and Dr. Willy Harvest 2008   BREAST SURGERY     in 2014 bx breast calcifications   BREAST SURGERY     augmentation with saline Dr. Ceclia Cohens 1995    CHOLECYSTECTOMY N/A 01/28/2015   Procedure: LAPAROSCOPIC CHOLECYSTECTOMY WITH INTRAOPERATIVE CHOLANGIOGRAM;  Surgeon: Lillette Reid III, MD;  Location: [facility] OR;  Service: General;  Laterality: N/A;   CLOSED MANIPULATION SHOULDER     rt   COLONOSCOPY WITH PROPOFOL  N/A 11/20/2012   Procedure: COLONOSCOPY WITH PROPOFOL ;  Surgeon: Garrett Kallman, MD;  Location: [facility] ENDOSCOPY;  Service: Endoscopy;  Laterality: N/A;   colonscopy     May 2014   JOINT REPLACEMENT     RTK   KNEE ARTHROSCOPY     left knee miniscus tear repair     MELANOMA EXCISION WITH SENTINEL LYMPH NODE BIOPSY  03/18/2011   Back, nodes neg   OOPHORECTOMY     ROTATOR CUFF REPAIR     left   SHOULDER  SURGERY     right and left    SKIN CANCER EXCISION     multiple   TOTAL HIP ARTHROPLASTY     TOTAL KNEE ARTHROPLASTY     right 2010 and left in 2018    TOTAL KNEE ARTHROPLASTY Left 05/16/2017   Procedure: LEFT TOTAL KNEE ARTHROPLASTY;  Surgeon: Claiborne Crew, MD;  Location: [facility] ORS;  Service: Orthopedics;  Laterality: Left;  70 mins   TUBAL LIGATION     TUMOR REMOVAL     ovary    Social History   Socioeconomic History   Marital status: Divorced    Spouse name: Not on file   Number of children: 3   Years of education: College   Highest education level: Some college, no degree  Occupational History   Occupation: Retired    Associate Professor: RETIRED  Tobacco Use   Smoking status: Never   Smokeless tobacco: Never  Vaping Use   Vaping status: Never Used  Substance and Sexual Activity   Alcohol use: Yes    Alcohol/week: 1.0 standard drink of alcohol    Types: 1 Cans of beer per week    Comment: once a week beer or wine   Drug use: No   Sexual activity: Not Currently  Other Topics Concern   Not on file  Social History Narrative   Patient lives at home alone in a one story home.   Has 3 children (2 daughters and 1 son)    Retired from [employer].   Divorced since 1992    Caffeine Use: 1 cup daily and a soft drink occasionally    No guns    Wears seat belt     Lives with cat    Left handed   Social Drivers of Health   Financial Resource Strain: Low Risk  (04/18/2023)   Overall Financial Resource Strain (CARDIA)    Difficulty of Paying Living Expenses: Not hard at all  Food Insecurity: No Food Insecurity (04/18/2023)   Hunger Vital Sign    Worried About Running Out of Food in the Last Year: Never true    Ran Out of Food in the Last  Year: Never true  Transportation Needs: No Transportation Needs (04/18/2023)   PRAPARE - Administrator, Civil Service (Medical): No    Lack of Transportation (Non-Medical): No  Physical Activity: Inactive (04/18/2023)    Exercise Vital Sign    Days of Exercise per Week: 0 days    Minutes of Exercise per Session: 0 min  Stress: Stress Concern Present (04/18/2023)   [employer] of Occupational Health - Occupational Stress Questionnaire    Feeling of Stress : To some extent  Social Connections: Moderately Integrated (04/18/2023)   Social Connection and Isolation Panel [NHANES]    Frequency of Communication with Friends and Family: More than three times a week    Frequency of Social Gatherings with Friends and Family: Three times a week    Attends Religious Services: More than 4 times per year    Active Member of Clubs or Organizations: Yes    Attends Banker Meetings: More than 4 times per year    Marital Status: Divorced  Intimate Partner Violence: Not At Risk (04/18/2023)   Humiliation, Afraid, Rape, and Kick questionnaire    Fear of Current or Ex-Partner: No    Emotionally Abused: No    Physically Abused: No    Sexually Abused: No    Family History  Problem Relation Age of Onset   Hypertension Mother    Stroke Mother    Hyperlipidemia Mother    Heart disease Father    Diabetes Father    Hypertension Father    Hyperlipidemia Father    Early death Father    Diabetes Sister    Heart disease Sister        heart murmur   Arthritis Sister    COPD Sister    Hyperlipidemia Sister    Hypertension Sister    Colon polyps Sister        adenomous   Arthritis Sister    Heart disease Sister    Hyperlipidemia Sister    Hypertension Sister    Cancer Sister    Dementia Sister    Tremor Sister    Stroke Maternal Grandmother    Colon cancer Maternal Grandfather    Cancer Maternal Grandfather    Early death Maternal Grandfather    Early death Paternal Grandmother    Stroke Paternal Grandmother    Heart disease Daughter    Hyperlipidemia Daughter    Hypertension Daughter    Heart disease Son    Hyperlipidemia Son    Alcohol abuse Son    Depression Son    Diabetes Son     Pancreatic cancer Maternal Uncle    Alzheimer's disease Paternal Aunt    Alzheimer's disease Paternal Aunt    Alzheimer's disease Paternal Aunt    Alzheimer's disease Paternal Aunt    Cancer Other        2 paternal uncles and 1 maternal uncle colon cancer   Esophageal cancer Neg Hx    Stomach cancer Neg Hx     Allergies  Allergen Reactions   Epinephrine  Other (See Comments) and Anaphylaxis    Heart palpitations   Other Anaphylaxis    Black pepper- caused bronchial tubes to start closing    Penicillins Swelling and Other (See Comments)    Lip swelling Has patient had a PCN reaction causing immediate rash, facial/tongue/throat swelling, SOB or lightheadedness with hypotension: Yes Has patient had a PCN reaction causing severe rash involving mucus membranes or skin necrosis: No Has patient had a PCN reaction  that required hospitalization No Has patient had a PCN reaction occurring within the last 10 years: No If all of the above answers are "NO", then may proceed with Cephalosporin use.   Valium [Diazepam] Other (See Comments)    Other reaction(s): Other (See Comments) Overly sensitive  sedation    Outpatient Medications Prior to Visit  Medication Sig   albuterol  (PROAIR  HFA) 108 (90 Base) MCG/ACT inhaler Inhale 1-2 puffs into the lungs every 6 (six) hours as needed for wheezing or shortness of breath.   aspirin  81 MG tablet TAKE 1 TABLET BY MOUTH EVERY DAY   atorvastatin  (LIPITOR) 20 MG tablet Take 1 tablet (20 mg total) by mouth daily.   Biotin 1000 MCG tablet Take 1,000 mcg by mouth daily.   BREO ELLIPTA  100-25 MCG/ACT AEPB Inhale 1 puff into the lungs daily.   Calcium  600-200 MG-UNIT tablet Take 1 tablet daily by mouth.   Cholecalciferol  (VITAMIN D3) 2000 UNITS capsule Take 2,000 Units by mouth daily.   cholestyramine  (QUESTRAN ) 4 g packet Take 1 packet (4 g total) by mouth 2 (two) times daily as needed.   clindamycin  (CLEOCIN ) 150 MG capsule Take 150 mg by mouth 4 (four)  times daily.   DULoxetine  (CYMBALTA ) 60 MG capsule Take 1 capsule (60 mg total) by mouth daily.   FOLIC ACID  PO Take by mouth daily.   levothyroxine  (SYNTHROID ) 75 MCG tablet Take 1 tablet (75 mcg total) by mouth daily before breakfast.   Magnesium  250 MG TABS Take by mouth.   olmesartan  (BENICAR ) 5 MG tablet Take 2 tablets (10 mg total) by mouth 2 (two) times daily.   Omega-3 Fatty Acids (FISH OIL) 1000 MG CAPS Take 1,000 mg by mouth daily.    ondansetron  (ZOFRAN ) 4 MG tablet Take 4 mg by mouth every 8 (eight) hours as needed for nausea or vomiting.   Potassium (POTASSIMIN PO) Take by mouth.   sucralfate  (CARAFATE ) 1 g tablet Take 1 tablet (1 g total) by mouth every 8 (eight) hours as needed. Slowly dissolve tablet in 1 tablespoon of distilled water before ingesting   vitamin B-12 (CYANOCOBALAMIN ) 1000 MCG tablet Take 2,000 mcg by mouth daily. 2 tabs   Vonoprazan Fumarate  (VOQUEZNA ) 10 MG TABS Take by mouth.   pantoprazole  (PROTONIX ) 40 MG tablet Take 40 mg by mouth 2 (two) times daily. (Patient not taking: Reported on 11/07/2023)   No facility-administered medications prior to visit.    Review of Systems  Constitutional: Negative.  Negative for chills, fever, malaise/fatigue and weight loss.  HENT: Negative.  Negative for sore throat.   Eyes: Negative.   Respiratory: Negative.  Negative for cough and shortness of breath.   Cardiovascular: Negative.  Negative for chest pain, palpitations and leg swelling.  Gastrointestinal: Negative.  Negative for abdominal pain, constipation, diarrhea, heartburn, nausea and vomiting.  Genitourinary: Negative.  Negative for dysuria and flank pain.  Musculoskeletal:  Positive for back pain. Negative for joint pain and myalgias.  Skin: Negative.   Neurological: Negative.  Negative for dizziness and headaches.  Endo/Heme/Allergies: Negative.   Psychiatric/Behavioral: Negative.  Negative for depression and suicidal ideas. The patient is not nervous/anxious.         Objective:   BP 108/60   Pulse 75   Ht 5\' 6"  (1.676 m)   Wt 185 lb (83.9 kg)   SpO2 97%   BMI 29.86 kg/m   Vitals:   11/07/23 1043  BP: 108/60  Pulse: 75  Height: 5\' 6"  (1.676 m)  Weight:  185 lb (83.9 kg)  SpO2: 97%  BMI (Calculated): 29.87    Physical Exam Vitals and nursing note reviewed.  Constitutional:      Appearance: Normal appearance.  HENT:     Head: Normocephalic and atraumatic.     Nose: Nose normal.     Mouth/Throat:     Mouth: Mucous membranes are moist.     Pharynx: Oropharynx is clear.  Eyes:     Conjunctiva/sclera: Conjunctivae normal.     Pupils: Pupils are equal, round, and reactive to light.  Cardiovascular:     Rate and Rhythm: Normal rate and regular rhythm.     Pulses: Normal pulses.     Heart sounds: Normal heart sounds. No murmur heard. Pulmonary:     Effort: Pulmonary effort is normal.     Breath sounds: Normal breath sounds. No wheezing.  Abdominal:     General: Bowel sounds are normal.     Palpations: Abdomen is soft.     Tenderness: There is no abdominal tenderness. There is no right CVA tenderness or left CVA tenderness.  Musculoskeletal:        General: Normal range of motion.     Cervical back: Normal range of motion.     Right lower leg: No edema.     Left lower leg: No edema.  Skin:    General: Skin is warm and dry.  Neurological:     General: No focal deficit present.     Mental Status: She is alert and oriented to person, place, and time.  Psychiatric:        Mood and Affect: Mood normal.        Behavior: Behavior normal.      Results for orders placed or performed in visit on 11/07/23  POCT Urinalysis Dipstick (81002)  Result Value Ref Range   Color, UA Orange    Clarity, UA Clear    Glucose, UA Negative Negative   Bilirubin, UA Negative    Ketones, UA Negative    Spec Grav, UA 1.015 1.010 - 1.025   Blood, UA Negative    pH, UA 5.5 5.0 - 8.0   Protein, UA Negative Negative   Urobilinogen, UA 0.2  0.2 or 1.0 E.U./dL   Nitrite, UA Negative    Leukocytes, UA Small (1+) (A) Negative   Appearance Clear    Odor Yes     Recent Results (from the past 2160 hours)  POCT Urinalysis Dipstick ([id number])     Status: None   Collection Time: 08/10/23 11:48 AM  Result Value Ref Range   Color, UA yellow    Clarity, UA clear    Glucose, UA Negative Negative   Bilirubin, UA neg    Ketones, UA neg    Spec Grav, UA 1.020 1.010 - 1.025   Blood, UA neg    pH, UA 5.5 5.0 - 8.0   Protein, UA Negative Negative   Urobilinogen, UA 0.2 0.2 or 1.0 E.U./dL   Nitrite, UA neg    Leukocytes, UA Negative Negative   Appearance clear    Odor no   CBC with Diff     Status: None   Collection Time: 08/11/23  9:02 AM  Result Value Ref Range   WBC 5.2 3.4 - 10.8 x10E3/uL   RBC 4.41 3.77 - 5.28 x10E6/uL   Hemoglobin 12.9 11.1 - 15.9 g/dL   Hematocrit 29.5 18.8 - 46.6 %   MCV 90 79 - 97 fL   MCH 29.3 26.6 - 33.0  pg   MCHC 32.7 31.5 - 35.7 g/dL   RDW 56.2 13.0 - 86.5 %   Platelets 222 150 - 450 x10E3/uL   Neutrophils 50 Not Estab. %   Lymphs 35 Not Estab. %   Monocytes 10 Not Estab. %   Eos 4 Not Estab. %   Basos 1 Not Estab. %   Neutrophils Absolute 2.7 1.4 - 7.0 x10E3/uL   Lymphocytes Absolute 1.8 0.7 - 3.1 x10E3/uL   Monocytes Absolute 0.5 0.1 - 0.9 x10E3/uL   EOS (ABSOLUTE) 0.2 0.0 - 0.4 x10E3/uL   Basophils Absolute 0.0 0.0 - 0.2 x10E3/uL   Immature Granulocytes 0 Not Estab. %   Immature Grans (Abs) 0.0 0.0 - 0.1 x10E3/uL  CMP14+EGFR     Status: Abnormal   Collection Time: 08/11/23  9:02 AM  Result Value Ref Range   Glucose 120 (H) 70 - 99 mg/dL   BUN 15 8 - 27 mg/dL   Creatinine, Ser 7.84 (H) 0.57 - 1.00 mg/dL   eGFR 48 (L) >69 GE/XBM/8.41   BUN/Creatinine Ratio 13 12 - 28   Sodium 143 134 - 144 mmol/L   Potassium 4.9 3.5 - 5.2 mmol/L   Chloride 102 96 - 106 mmol/L   CO2 24 20 - 29 mmol/L   Calcium  9.3 8.7 - 10.3 mg/dL   Total Protein 6.2 6.0 - 8.5 g/dL   Albumin 4.2 3.7 - 4.7 g/dL    Globulin, Total 2.0 1.5 - 4.5 g/dL   Bilirubin Total 1.4 (H) 0.0 - 1.2 mg/dL   Alkaline Phosphatase 77 44 - 121 IU/L   AST 14 0 - 40 IU/L   ALT 11 0 - 32 IU/L  Lipid Panel w/o Chol/HDL Ratio     Status: None   Collection Time: 08/11/23  9:02 AM  Result Value Ref Range   Cholesterol, Total 151 100 - 199 mg/dL   Triglycerides 84 0 - 149 mg/dL   HDL 78 >32 mg/dL   VLDL Cholesterol Cal 16 5 - 40 mg/dL   LDL Chol Calc (NIH) 57 0 - 99 mg/dL  GMW+[id number]     Status: None   Collection Time: 08/11/23  9:02 AM  Result Value Ref Range   TSH 2.550 0.450 - 4.500 uIU/mL   T3, Free 2.9 2.0 - 4.4 pg/mL   Free T4 1.37 0.82 - 1.77 ng/dL  Vitamin B12     Status: None   Collection Time: 08/11/23  9:02 AM  Result Value Ref Range   Vitamin B-12 964 232 - 1,245 pg/mL  POCT XPERT XPRESS SARS COVID-2/FLU/RSV     Status: None   Collection Time: 09/19/23 11:50 AM  Result Value Ref Range   SARS Coronavirus 2 Negative    FLU A Negative    FLU B Negative    RSV RNA, PCR Negative   Surgical pathology ([facility])     Status: None   Collection Time: 10/05/23 12:00 AM  Result Value Ref Range   SURGICAL PATHOLOGY      SURGICAL PATHOLOGY [facility] [address] [place], [place] [address] Telephone [phone] or [phone] Fax [phone]  REPORT OF SURGICAL PATHOLOGY   Accession #: [id number] Patient Name: HAZLEIGH, MCCLEAVE Visit # : [id number]  MRN: [id number] Physician: Alvester Johnson DOB/Age June 20, 1941 (Age: 72) Gender: F Collected Date: 10/05/2023 Received Date: 10/06/2023  FINAL DIAGNOSIS       1. Surgical [P], gastric :       - MILD REACTIVE GASTROPATHY.      -  NEGATIVE FOR H. PYLORI ON H&E STAIN      - NO INTESTINAL METAPLASIA, DYSPLASIA, OR MALIGNANCY.       DATE SIGNED OUT: 10/09/2023 ELECTRONIC SIGNATURE Almeda Jacobs M.D., Nupur, Pathologist, Electronic Signature  MICROSCOPIC DESCRIPTION  CASE COMMENTS STAINS USED IN  DIAGNOSIS: H&E    CLINICAL HISTORY  SPECIMEN(S) OBTAINED 1. Surgical [P], Gastric  SPECIMEN COMMENTS: 1. Gastroesophageal reflux disease with esophagitis and hemorrhage; mucosal abnormality of sto mach SPECIMEN CLINICAL INFORMATION: 1. R/O H. pylori    Gross Description 1. Received in formalin are tan, soft tissue fragments that are submitted in toto. Number: 2 Size: 0.4 cm, (1B) ( TA )        Report signed out from the following location(s) [facility] [address] [place], [place] [address] CLIA #: [id number]  [facility] [address] [place], [place] [address] CLIA #: [id number]   POCT Urinalysis Dipstick [phone])     Status: None   Collection Time: 10/30/23  1:58 PM  Result Value Ref Range   Color, UA Yellow    Clarity, UA Clear    Glucose, UA Negative Negative   Bilirubin, UA Negative    Ketones, UA Trace    Spec Grav, UA 1.015 1.010 - 1.025   Blood, UA Negative    pH, UA 5.5 5.0 - 8.0   Protein, UA Negative Negative   Urobilinogen, UA 0.2 0.2 or 1.0 E.U./dL   Nitrite, UA Negative    Leukocytes, UA Negative Negative   Appearance Clear    Odor No   POCT Urinalysis Dipstick ([id number])     Status: Abnormal   Collection Time: 11/07/23 11:56 AM  Result Value Ref Range   Color, UA Orange    Clarity, UA Clear    Glucose, UA Negative Negative   Bilirubin, UA Negative    Ketones, UA Negative    Spec Grav, UA 1.015 1.010 - 1.025   Blood, UA Negative    pH, UA 5.5 5.0 - 8.0   Protein, UA Negative Negative   Urobilinogen, UA 0.2 0.2 or 1.0 E.U./dL   Nitrite, UA Negative    Leukocytes, UA Small (1+) (A) Negative   Appearance Clear    Odor Yes       Assessment & Plan:  Send urine for culture and sensitivity.  Add Macrobid .  Continue rest of medications. Problem List Items Addressed This Visit     Urine frequency   Hyperlipidemia   GERD (gastroesophageal reflux disease)   Relevant Medications   Vonoprazan  Fumarate (VOQUEZNA ) 10 MG TABS   Essential hypertension, benign   GAD (generalized anxiety disorder)   Other Visit Diagnoses       Low back pain, unspecified back pain laterality, unspecified chronicity, unspecified whether sciatica present    -  Primary   Relevant Orders   POCT Urinalysis Dipstick ([id number]) (Completed)       Follow up as scheduled.  Total time spent: 25 minutes  Aisha Hove, MD  11/07/2023   This document may have been prepared by [facility] Voice Recognition software and as such may include unintentional dictation errors.

## 2023-11-08 NOTE — Telephone Encounter (Signed)
 No return call received following 2 calls to reach patient. Will await call from patient before continuing additional communication attempts.

## 2023-11-08 NOTE — Addendum Note (Signed)
 Addended by: Marlies Ligman on: 11/08/2023 04:10 PM   Modules accepted: Orders

## 2023-11-09 ENCOUNTER — Other Ambulatory Visit: Payer: Self-pay | Admitting: Internal Medicine

## 2023-11-09 ENCOUNTER — Telehealth: Payer: Self-pay

## 2023-11-09 DIAGNOSIS — N3000 Acute cystitis without hematuria: Secondary | ICD-10-CM

## 2023-11-09 MED ORDER — NITROFURANTOIN MONOHYD MACRO 100 MG PO CAPS: 100.0000 mg | ORAL_CAPSULE | Freq: Two times a day (BID) | ORAL | 0 refills | Status: AC

## 2023-11-09 NOTE — Telephone Encounter (Signed)
 [name] in the LAB  had a Urine order printed and urine left in the sink  after the left yesterday overnight, [name] is unable to use the urine since it was not refrigerated. She has been calling the patient to see if she can come back and give another sample but has been unsuccessful in reaching her. The patient was on the schedule for Tuesday but she wasn't sure if this was the urine from then or if it was a new one since it was left after she was gone for the day.  [name] wanted me to make you aware so you wouldn't be looking for the results.

## 2023-11-09 NOTE — Telephone Encounter (Signed)
 Patient informed via mychart

## 2023-11-10 DIAGNOSIS — D485 Neoplasm of uncertain behavior of skin: Secondary | ICD-10-CM | POA: Diagnosis not present

## 2023-11-10 DIAGNOSIS — C44729 Squamous cell carcinoma of skin of left lower limb, including hip: Secondary | ICD-10-CM | POA: Diagnosis not present

## 2023-11-20 ENCOUNTER — Ambulatory Visit: Admitting: Podiatry

## 2023-11-20 ENCOUNTER — Telehealth: Payer: Self-pay | Admitting: Internal Medicine

## 2023-11-20 ENCOUNTER — Other Ambulatory Visit: Payer: Self-pay

## 2023-11-20 ENCOUNTER — Other Ambulatory Visit: Payer: Self-pay | Admitting: Nurse Practitioner

## 2023-11-20 DIAGNOSIS — J40 Bronchitis, not specified as acute or chronic: Secondary | ICD-10-CM

## 2023-11-20 MED ORDER — ALBUTEROL SULFATE HFA 108 (90 BASE) MCG/ACT IN AERS: 1.0000 | INHALATION_SPRAY | Freq: Four times a day (QID) | RESPIRATORY_TRACT | 11 refills | Status: AC | PRN

## 2023-11-20 NOTE — Telephone Encounter (Signed)
 PT is had anafalaxisis on Saturday, is scared that it is going to happen again & is needing her emergency inhaler albuterol 

## 2023-11-28 ENCOUNTER — Ambulatory Visit

## 2023-11-29 ENCOUNTER — Telehealth: Payer: Self-pay

## 2023-11-29 ENCOUNTER — Ambulatory Visit: Admitting: Podiatry

## 2023-11-29 ENCOUNTER — Ambulatory Visit (INDEPENDENT_AMBULATORY_CARE_PROVIDER_SITE_OTHER)

## 2023-11-29 ENCOUNTER — Other Ambulatory Visit: Payer: Self-pay | Admitting: Pulmonary Disease

## 2023-11-29 ENCOUNTER — Encounter: Payer: Self-pay | Admitting: Podiatry

## 2023-11-29 DIAGNOSIS — M2041 Other hammer toe(s) (acquired), right foot: Secondary | ICD-10-CM

## 2023-11-29 MED ORDER — BREO ELLIPTA 100-25 MCG/ACT IN AEPB: 1.0000 | INHALATION_SPRAY | Freq: Every day | RESPIRATORY_TRACT | 3 refills | Status: AC

## 2023-11-29 NOTE — Telephone Encounter (Signed)
 Pt called regarding needing refill on rx Breo inhaler, she's leaving to go on vacation on Saturday for 2 weeks. She asked if you can send rx to [facility] for her? Please advise

## 2023-11-29 NOTE — Progress Notes (Signed)
 [name] presents today with a chief concern of a painful area in the webspace of her 4th and 5th digit right foot.  She states that it has been worse with redness and drainage at 1 time but is now just painful.  Objective: Vitals are stable alert oriented x 3 evaluation of her fourth sulcus right foot does demonstrate a benign skin lesion proximally it appears to be firm rather than soft so it discounts a heloma molle.  She does have osteoarthritic changes to the toes as visualized by radiographs taken today.  Assessment: Osteoarthritis with hammertoe deformities 4th and 5th digits of the right foot resulting in soft tissue lesion.  Plan: I applied a small amount of local anesthetic just behind the proximal sulcus today.  I debrided the benign skin lesion she tolerated the procedure well she will watch for signs and symptoms of infection should there be any she will notify us  immediately she will notify us  should this recur.

## 2023-11-30 ENCOUNTER — Ambulatory Visit: Payer: PPO | Admitting: Internal Medicine

## 2023-12-01 ENCOUNTER — Ambulatory Visit: Admitting: Gastroenterology

## 2023-12-01 ENCOUNTER — Encounter: Payer: Self-pay | Admitting: Gastroenterology

## 2023-12-01 VITALS — BP 140/78 | HR 64 | Ht 66.0 in | Wt 188.0 lb

## 2023-12-01 DIAGNOSIS — K2100 Gastro-esophageal reflux disease with esophagitis, without bleeding: Secondary | ICD-10-CM

## 2023-12-01 DIAGNOSIS — K449 Diaphragmatic hernia without obstruction or gangrene: Secondary | ICD-10-CM

## 2023-12-01 DIAGNOSIS — K529 Noninfective gastroenteritis and colitis, unspecified: Secondary | ICD-10-CM | POA: Diagnosis not present

## 2023-12-01 DIAGNOSIS — K2210 Ulcer of esophagus without bleeding: Secondary | ICD-10-CM

## 2023-12-01 DIAGNOSIS — K2101 Gastro-esophageal reflux disease with esophagitis, with bleeding: Secondary | ICD-10-CM

## 2023-12-01 MED ORDER — VOQUEZNA 10 MG PO TABS: 10.0000 mg | ORAL_TABLET | Freq: Every day | ORAL | 3 refills | Status: DC

## 2023-12-01 NOTE — Progress Notes (Signed)
 Agree with assessment and plan as outlined.  Glad to hear the Dyanne Glass is working.  I agree I would taper this to 10 mg dosing at this point daily and likely would need continued therapy with this if it works and she tolerates it given higher dose PPI did not help.  Another question is if she needs a surveillance endoscopy to assess for mucosal healing and ensure no evidence of Barrett's.  Given her age and no high risk pathology on her last exam I do not feel strongly that she needs that right now.  I would like to see her in 6 months for reassessment in the office to discuss long-term plan.  Thanks

## 2023-12-01 NOTE — Patient Instructions (Signed)
 We have sent the following medications to your pharmacy for you to pick up at your convenience: Voquezna  10 mg, take 1 tablet daily  Follow up in 3 months.  Thank you for trusting me with your gastrointestinal care!   Suzanna Erp, PA   _______________________________________________________  If your blood pressure at your visit was 140/90 or greater, please contact your primary care physician to follow up on this.  _______________________________________________________  If you are age 83 or older, your body mass index should be between 23-30. Your Body mass index is 27.12 kg/m. If this is out of the aforementioned range listed, please consider follow up with your Primary Care Provider.  If you are age 69 or younger, your body mass index should be between 19-25. Your Body mass index is 27.12 kg/m. If this is out of the aformentioned range listed, please consider follow up with your Primary Care Provider.   ________________________________________________________  The [facility] providers would like to encourage you to use MYCHART to communicate with providers for non-urgent requests or questions.  Due to long hold times on the telephone, sending your provider a message by [facility] may be a faster and more efficient way to get a response.  Please allow 48 business hours for a response.  Please remember that this is for non-urgent requests.  _______________________________________________________

## 2023-12-01 NOTE — Progress Notes (Signed)
 Chief Complaint: Persistent GERD, erosive esophagitis, hiatal hernia Primary GI MD: Dr. General Kenner  HPI: 83 year old female s/p cholecystectomy and others as listed below presents for follow-up.   Visit 04/26/2023: Chronic diarrhea, negative celiac testing, associated urinary incontinence/frequency but unable to go to pelvic floor physical therapy due to time constraints.  EGD with erosive esophagitis  Visit 06/22/2023: Continued diarrhea with stress fecal incontinence and urinary frequency.  Negative fecal elastase.  Recommended fiber.  Continue persistent GERD symptoms despite PPI twice daily and Carafate .  Recommended Voquenza  Visit 09/11/2023 with Dr. General Kenner: Continued reflux symptoms in the setting of hiatal hernia with intermittent hematemesis and history of erosive esophagitis, gastritis, increased eosinophils.  Provided with free samples of Eliquis and at that time also recommended repeat EGD which showed LA grade B reflux esophagitis and 6 cm hiatal hernia  Discussed the use of AI scribe software for clinical note transcription with the patient, who gave verbal consent to proceed.  History of Present Illness She has undergone an endoscopy which revealed a very large hiatal hernia and inflammation in her esophagus. She experiences symptoms including vomiting and diarrhea. She was prescribed Voquezna , which she completed this morning, and it helped with diarrhea and reduced the frequency of reflux to twice since starting the medication.  She also mentions having two more skin cancers and is scheduled for surgery after her upcoming vacation. She has a lesion on her back that has not healed for six weeks.   PREVIOUS GI WORKUP   History of colonoscopy in 2014 by [facility] with having to be admitted to the hospital for pain after procedure and concern for perforation. She was told she should never have a colonoscopy again.    EGD 2018 for hematemesis - Esophagogastric landmarks  identified.  - 3 cm hiatal hernia.  - Multiple plaques in the proximal esophagus and in the mid esophagus, suspicious for esophageal candidiasis. Brushings performed.  - No evidence of Barrett' s or esophagitis  - Normal stomach.  - Normal duodenal bulb and second portion of the duodenum.   CT scan 07/26/2017 IMPRESSION: 1. No acute process in the abdomen or pelvis. No evidence of metastatic disease in the abdomen or pelvis. 2.  Aortic Atherosclerosis (ICD10-I70.0). 3. Pelvic floor laxity. 4. Hiatal hernia.     Virtual colonoscopy 02/25/2020 - IMPRESSION: 1. Suboptimal evaluation of the sigmoid, secondary to underdistention. No circumferential mass within this region. More proximally, no evidence of clinically significant colonic polyp or mass. 2. Small hiatal hernia. 3. Aortic Atherosclerosis (ICD10-I70.0).     CT renal stone study 08/18/22: IMPRESSION: Extensive colonic diverticulosis without evidence of diverticulitis.  Moderate-sized hiatal hernia. Umbilical hernia containing fat. No acute intra-abdominal or intrapelvic abnormalities. Aortic Atherosclerosis (ICD10-I70.0).     CT abdomen / pelvis 11/27/22: IMPRESSION: 1. No acute localizing process in the abdomen or pelvis. 2. Colonic diverticulosis without evidence for diverticulitis. 3. Moderate-sized hiatal hernia.   EGD 05/16/2023 for hematemesis and GERD - LA Grade B reflux esophagitis with no bleeding. Biopsied.  - 4 cm hiatal hernia. 31- 35 cm  - Gastritis focal area posterior wall of antrum. Biopsied.  - Gastroesophageal flap valve classified as Hill Grade II ( fold present, opens with respiration) .  - The examination was otherwise normal. She did have transient O2 desaturation w/ o complications.  EGD 10/05/2023 - 6 cm hiatal hernia.  - LA Grade B reflux esophagitis.  - Normal stomach. Biopsied.  - Normal examined duodenum.  Past Medical History:  Diagnosis  Date   Acid reflux    Allergy    Anemia     Anxiety    in past   Arthritis    all over   Asthma    Atypical moles    Bronchitis    Bronchospasm    reactive with perfume/cologne and smoke   CAD (coronary artery disease)    patient denies on preop of 10/24    Candida esophagitis (HCC)    Carotid artery calcification, bilateral 04/2016   Carpal tunnel syndrome of left wrist 12/03/2019   Cataract    Cervical stenosis of spine    Chronic insomnia    Complication of anesthesia    shoulder surgery- not all the way asleep   COVID-19 02/04/2021   also 10/3 or 04/20/21 sxs started 04/05/21   Depression    in past   Diverticulitis    Diverticulosis    Dizziness    Elevated liver enzymes    Family history of adverse reaction to anesthesia    sister- has problems with nausea and vomiting    Fatigue    Fatty liver    Generalized headache    migraines   GERD (gastroesophageal reflux disease)    Gross hematuria 08/18/2022   Hereditary and idiopathic peripheral neuropathy 05/11/2015   History of chemotherapy    topical cream for h/o skin CA   History of kidney stones    History of neuropathy    [facility] Dr. Lydia Sams neurology   History of vertigo    Hx of migraines    Hyperlipidemia    Hypothyroidism    Insomnia    Irritable bowel syndrome    LLQ abdominal pain    Melanoma (HCC) 2013   Melanoma (HCC)    x2 stg II melanoma   Neuropathy, peripheral    Peripheral neuropathy    Pneumonia    hx of x 2   PONV (postoperative nausea and vomiting)    Pre-diabetes    Retina disorder    left   SCC (squamous cell carcinoma)    skin    Skin cancer 07/17/2019   SCC left leg s/p mohs   SCC 05/01/18 left medial mid leg, right post mid leg 06/08/18, left mid well 04/10/19  Dr. Joanne Muckle derm, Dr. Eva Hikes mohs       Stroke [facility])    ? TIA per Dr V per patient    Thyroid  disease    Urine incontinence    Viral URI with cough 06/09/2022   Vitamin D  deficiency    Weight gain     Past Surgical History:  Procedure Laterality Date    ABLATION Bilateral    Dr. Vonna Guardian 12/2020 veins laser ablation of both great saphenous veins in a staged fashion.   BILATERAL SALPINGOOPHORECTOMY     ovarian cyst Dr. Alva Jewels and Dr. Willy Harvest 2008   BREAST SURGERY     in 2014 bx breast calcifications   BREAST SURGERY     augmentation with saline Dr. Ceclia Cohens 1995    CHOLECYSTECTOMY N/A 01/28/2015   Procedure: LAPAROSCOPIC CHOLECYSTECTOMY WITH INTRAOPERATIVE CHOLANGIOGRAM;  Surgeon: Lillette Reid III, MD;  Location: [facility] OR;  Service: General;  Laterality: N/A;   CLOSED MANIPULATION SHOULDER     rt   COLONOSCOPY WITH PROPOFOL  N/A 11/20/2012   Procedure: COLONOSCOPY WITH PROPOFOL ;  Surgeon: Garrett Kallman, MD;  Location: [facility] ENDOSCOPY;  Service: Endoscopy;  Laterality: N/A;   colonscopy     May 2014   FOOT BONE EXCISION  JOINT REPLACEMENT     RTK   KNEE ARTHROSCOPY     left knee miniscus tear repair     MELANOMA EXCISION WITH SENTINEL LYMPH NODE BIOPSY  03/18/2011   Back, nodes neg   OOPHORECTOMY     ROTATOR CUFF REPAIR     left   SHOULDER SURGERY     right and left    SKIN CANCER EXCISION     multiple   TOTAL HIP ARTHROPLASTY     TOTAL KNEE ARTHROPLASTY     right 2010 and left in 2018    TOTAL KNEE ARTHROPLASTY Left 05/16/2017   Procedure: LEFT TOTAL KNEE ARTHROPLASTY;  Surgeon: Claiborne Crew, MD;  Location: [facility] ORS;  Service: Orthopedics;  Laterality: Left;  70 mins   TUBAL LIGATION     TUMOR REMOVAL     ovary    Current Outpatient Medications  Medication Sig Dispense Refill   albuterol  (PROAIR  HFA) 108 (90 Base) MCG/ACT inhaler Inhale 1-2 puffs into the lungs every 6 (six) hours as needed for wheezing or shortness of breath. 18 g 11   aspirin  81 MG tablet TAKE 1 TABLET BY MOUTH EVERY DAY     atorvastatin  (LIPITOR) 20 MG tablet Take 1 tablet (20 mg total) by mouth daily. 90 tablet 3   Biotin 1000 MCG tablet Take 1,000 mcg by mouth daily.     BREO ELLIPTA  100-25 MCG/ACT AEPB Inhale 1 puff into the lungs daily. Inhale 1 puff  by mouth once daily 60 each 3   Calcium  600-200 MG-UNIT tablet Take 1 tablet daily by mouth.     Cholecalciferol  (VITAMIN D3) 2000 UNITS capsule Take 2,000 Units by mouth daily.     clindamycin  (CLEOCIN ) 150 MG capsule Take 150 mg by mouth 4 (four) times daily.     DULoxetine  (CYMBALTA ) 60 MG capsule Take 1 capsule (60 mg total) by mouth daily. 90 capsule 2   FOLIC ACID  PO Take by mouth daily.     levothyroxine  (SYNTHROID ) 75 MCG tablet Take 1 tablet (75 mcg total) by mouth daily before breakfast. 90 tablet 3   Magnesium  250 MG TABS Take by mouth.     olmesartan  (BENICAR ) 5 MG tablet Take 2 tablets (10 mg total) by mouth 2 (two) times daily. 120 tablet 6   Omega-3 Fatty Acids (FISH OIL) 1000 MG CAPS Take 1,000 mg by mouth daily.      Potassium (POTASSIMIN PO) Take by mouth.     vitamin B-12 (CYANOCOBALAMIN ) 1000 MCG tablet Take 2,000 mcg by mouth daily. 2 tabs     Vonoprazan Fumarate  (VOQUEZNA ) 10 MG TABS Take 10 mg by mouth daily. 30 tablet 3   No current facility-administered medications for this visit.    Allergies as of 12/01/2023 - Review Complete 12/01/2023  Allergen Reaction Noted   Epinephrine  Other (See Comments) and Anaphylaxis 02/28/2011   Other Anaphylaxis 05/10/2017   Penicillins Swelling and Other (See Comments) 02/28/2011   Valium [diazepam] Other (See Comments) 07/01/2014    Family History  Problem Relation Age of Onset   Hypertension Mother    Stroke Mother    Hyperlipidemia Mother    Heart disease Father    Diabetes Father    Hypertension Father    Hyperlipidemia Father    Early death Father    Diabetes Sister    Heart disease Sister        heart murmur   Arthritis Sister    COPD Sister    Hyperlipidemia Sister  Hypertension Sister    Colon polyps Sister        adenomous   Arthritis Sister    Heart disease Sister    Hyperlipidemia Sister    Hypertension Sister    Cancer Sister    Dementia Sister    Tremor Sister    Stroke Maternal Grandmother     Colon cancer Maternal Grandfather    Cancer Maternal Grandfather    Early death Maternal Grandfather    Early death Paternal Grandmother    Stroke Paternal Grandmother    Heart disease Daughter    Hyperlipidemia Daughter    Hypertension Daughter    Heart disease Son    Hyperlipidemia Son    Alcohol abuse Son    Depression Son    Diabetes Son    Pancreatic cancer Maternal Uncle    Alzheimer's disease Paternal Aunt    Alzheimer's disease Paternal Aunt    Alzheimer's disease Paternal Aunt    Alzheimer's disease Paternal Aunt    Cancer Other        2 paternal uncles and 1 maternal uncle colon cancer   Esophageal cancer Neg Hx    Stomach cancer Neg Hx     Social History   Socioeconomic History   Marital status: Divorced    Spouse name: Not on file   Number of children: 3   Years of education: College   Highest education level: Some college, no degree  Occupational History   Occupation: Retired    Associate Professor: RETIRED  Tobacco Use   Smoking status: Never   Smokeless tobacco: Never  Vaping Use   Vaping status: Never Used  Substance and Sexual Activity   Alcohol use: Yes    Alcohol/week: 1.0 standard drink of alcohol    Types: 1 Cans of beer per week    Comment: mixed drink about two times a year   Drug use: No   Sexual activity: Not Currently  Other Topics Concern   Not on file  Social History Narrative   Patient lives at home alone in a one story home.   Has 3 children (2 daughters and 1 son)    Retired from [employer].   Divorced since 1992    Caffeine Use: 1 cup daily and a soft drink occasionally    No guns    Wears seat belt     Lives with cat    Left handed   Social Drivers of Health   Financial Resource Strain: Low Risk  (04/18/2023)   Overall Financial Resource Strain (CARDIA)    Difficulty of Paying Living Expenses: Not hard at all  Food Insecurity: No Food Insecurity (04/18/2023)   Hunger Vital Sign    Worried About Running Out of Food in  the Last Year: Never true    Ran Out of Food in the Last Year: Never true  Transportation Needs: No Transportation Needs (04/18/2023)   PRAPARE - Administrator, Civil Service (Medical): No    Lack of Transportation (Non-Medical): No  Physical Activity: Inactive (04/18/2023)   Exercise Vital Sign    Days of Exercise per Week: 0 days    Minutes of Exercise per Session: 0 min  Stress: Stress Concern Present (04/18/2023)   [employer] of Occupational Health - Occupational Stress Questionnaire    Feeling of Stress : To some extent  Social Connections: Moderately Integrated (04/18/2023)   Social Connection and Isolation Panel [NHANES]    Frequency of Communication with Friends and Family:  More than three times a week    Frequency of Social Gatherings with Friends and Family: Three times a week    Attends Religious Services: More than 4 times per year    Active Member of Clubs or Organizations: Yes    Attends Banker Meetings: More than 4 times per year    Marital Status: Divorced  Intimate Partner Violence: Not At Risk (04/18/2023)   Humiliation, Afraid, Rape, and Kick questionnaire    Fear of Current or Ex-Partner: No    Emotionally Abused: No    Physically Abused: No    Sexually Abused: No    Review of Systems:    Constitutional: No weight loss, fever, chills, weakness or fatigue HEENT: Eyes: No change in vision               Ears, Nose, Throat:  No change in hearing or congestion Skin: No rash or itching Cardiovascular: No chest pain, chest pressure or palpitations   Respiratory: No SOB or cough Gastrointestinal: See HPI and otherwise negative Genitourinary: No dysuria or change in urinary frequency Neurological: No headache, dizziness or syncope Musculoskeletal: No new muscle or joint pain Hematologic: No bleeding or bruising Psychiatric: No history of depression or anxiety    Physical Exam:  Vital signs: BP (!) 140/78   Pulse 64   Ht 5\' 6"   (1.676 m)   Wt 168 lb (76.2 kg)   BMI 27.12 kg/m   Constitutional: NAD, alert and cooperative Head:  Normocephalic and atraumatic. Eyes:   PEERL, EOMI. No icterus. Conjunctiva pink. Respiratory: Respirations even and unlabored. Lungs clear to auscultation bilaterally.   No wheezes, crackles, or rhonchi.  Cardiovascular:  Regular rate and rhythm. No peripheral edema, cyanosis or pallor.  Gastrointestinal:  Soft, nondistended, nontender. No rebound or guarding. Normal bowel sounds. No appreciable masses or hepatomegaly. Rectal:  Declines Msk:  Symmetrical without gross deformities. Without edema, no deformity or joint abnormality.  Neurologic:  Alert and  oriented x4;  grossly normal neurologically.  Skin:   Dry and intact without significant lesions or rashes. Psychiatric: Oriented to person, place and time. Demonstrates good judgement and reason without abnormal affect or behaviors.  RELEVANT LABS AND IMAGING: CBC    Component Value Date/Time   WBC 5.2 08/11/2023 0902   WBC 6.2 03/21/2023 1513   RBC 4.41 08/11/2023 0902   RBC 4.40 03/21/2023 1513   HGB 12.9 08/11/2023 0902   HGB 14.9 04/07/2016 1157   HCT 39.5 08/11/2023 0902   HCT 45.7 04/07/2016 1157   PLT 222 08/11/2023 0902   MCV 90 08/11/2023 0902   MCV 91.1 04/07/2016 1157   MCH 29.3 08/11/2023 0902   MCH 29.8 03/21/2023 1513   MCHC 32.7 08/11/2023 0902   MCHC 32.2 03/21/2023 1513   RDW 12.3 08/11/2023 0902   RDW 13.9 04/07/2016 1157   LYMPHSABS 1.8 08/11/2023 0902   LYMPHSABS 1.8 04/07/2016 1157   MONOABS 0.6 08/22/2022 1442   MONOABS 0.6 04/07/2016 1157   EOSABS 0.2 08/11/2023 0902   BASOSABS 0.0 08/11/2023 0902   BASOSABS 0.0 04/07/2016 1157    CMP     Component Value Date/Time   NA 143 08/11/2023 0902   NA 143 04/07/2016 1158   K 4.9 08/11/2023 0902   K 4.0 04/07/2016 1158   CL 102 08/11/2023 0902   CL 113 (H) 05/22/2012 1004   CO2 24 08/11/2023 0902   CO2 21 (L) 04/07/2016 1158   GLUCOSE 120 (H)  08/11/2023 0902  GLUCOSE 98 03/21/2023 1513   GLUCOSE 103 04/07/2016 1158   GLUCOSE 111 (H) 05/22/2012 1004   BUN 15 08/11/2023 0902   BUN 23.0 04/07/2016 1158   CREATININE 1.15 (H) 08/11/2023 0902   CREATININE 1.10 (H) 04/14/2020 0826   CREATININE 0.9 04/07/2016 1158   CALCIUM  9.3 08/11/2023 0902   CALCIUM  9.3 04/07/2016 1158   PROT 6.2 08/11/2023 0902   PROT 7.1 04/07/2016 1158   ALBUMIN 4.2 08/11/2023 0902   ALBUMIN 3.9 04/07/2016 1158   AST 14 08/11/2023 0902   AST 12 04/07/2016 1158   ALT 11 08/11/2023 0902   ALT 16 04/07/2016 1158   ALKPHOS 77 08/11/2023 0902   ALKPHOS 79 04/07/2016 1158   BILITOT 1.4 (H) 08/11/2023 0902   BILITOT 1.40 (H) 04/07/2016 1158   GFRNONAA 58 (L) 03/21/2023 1513   GFRAA >60 05/31/2019 1251     Assessment/Plan:   GERD with esophagitis Hiatal hernia EGD 09/2023 with LA grade B reflux esophagitis and 6 cm hiatal hernia. Previous failed BID PPI and carafate . Currently on Voquezna  and completed the 8 week trial of 20mg  with drastic improvement in symptoms to only two times. It also has helped her diarrhea interestingly enough. -- recommend maintenance dose voquezna  10mg  x 6 months -- follow up in 4 months  -- once dose of Voquezna  is complete, we can discuss getting back on PPI BID versus potential hiatal hernia surgery if refractory symptoms  Chronic diarrhea Currently well controlled on fiber and voquezna    [facility] 12/01/2023, 11:14 AM  Cc: Aisha Hove, MD

## 2023-12-04 ENCOUNTER — Ambulatory Visit: Admitting: Gastroenterology

## 2023-12-05 ENCOUNTER — Encounter (INDEPENDENT_AMBULATORY_CARE_PROVIDER_SITE_OTHER): Payer: Self-pay

## 2023-12-12 DIAGNOSIS — L03116 Cellulitis of left lower limb: Secondary | ICD-10-CM | POA: Diagnosis not present

## 2023-12-12 DIAGNOSIS — J00 Acute nasopharyngitis [common cold]: Secondary | ICD-10-CM | POA: Diagnosis not present

## 2023-12-18 ENCOUNTER — Observation Stay
Admission: EM | Admit: 2023-12-18 | Discharge: 2023-12-19 | Disposition: A | Discharge status: Home / Self Care | Attending: Internal Medicine | Admitting: Internal Medicine

## 2023-12-18 ENCOUNTER — Other Ambulatory Visit: Payer: Self-pay

## 2023-12-18 ENCOUNTER — Emergency Department

## 2023-12-18 DIAGNOSIS — R112 Nausea with vomiting, unspecified: Secondary | ICD-10-CM | POA: Diagnosis not present

## 2023-12-18 DIAGNOSIS — Z6830 Body mass index (BMI) 30.0-30.9, adult: Secondary | ICD-10-CM | POA: Insufficient documentation

## 2023-12-18 DIAGNOSIS — L03116 Cellulitis of left lower limb: Secondary | ICD-10-CM | POA: Insufficient documentation

## 2023-12-18 DIAGNOSIS — Z1331 Encounter for screening for depression: Secondary | ICD-10-CM | POA: Diagnosis not present

## 2023-12-18 DIAGNOSIS — E669 Obesity, unspecified: Secondary | ICD-10-CM | POA: Insufficient documentation

## 2023-12-18 DIAGNOSIS — I259 Chronic ischemic heart disease, unspecified: Secondary | ICD-10-CM | POA: Insufficient documentation

## 2023-12-18 DIAGNOSIS — E039 Hypothyroidism, unspecified: Secondary | ICD-10-CM | POA: Insufficient documentation

## 2023-12-18 DIAGNOSIS — E860 Dehydration: Secondary | ICD-10-CM | POA: Insufficient documentation

## 2023-12-18 DIAGNOSIS — L0390 Cellulitis, unspecified: Secondary | ICD-10-CM | POA: Diagnosis present

## 2023-12-18 DIAGNOSIS — N179 Acute kidney failure, unspecified: Secondary | ICD-10-CM | POA: Diagnosis not present

## 2023-12-18 DIAGNOSIS — Z7982 Long term (current) use of aspirin: Secondary | ICD-10-CM | POA: Diagnosis not present

## 2023-12-18 DIAGNOSIS — J45909 Unspecified asthma, uncomplicated: Secondary | ICD-10-CM | POA: Insufficient documentation

## 2023-12-18 DIAGNOSIS — R531 Weakness: Secondary | ICD-10-CM

## 2023-12-18 DIAGNOSIS — M79605 Pain in left leg: Secondary | ICD-10-CM | POA: Diagnosis not present

## 2023-12-18 DIAGNOSIS — I10 Essential (primary) hypertension: Secondary | ICD-10-CM | POA: Diagnosis not present

## 2023-12-18 DIAGNOSIS — M7989 Other specified soft tissue disorders: Secondary | ICD-10-CM | POA: Diagnosis not present

## 2023-12-18 DIAGNOSIS — M79606 Pain in leg, unspecified: Secondary | ICD-10-CM | POA: Diagnosis present

## 2023-12-18 DIAGNOSIS — R509 Fever, unspecified: Secondary | ICD-10-CM | POA: Diagnosis present

## 2023-12-18 LAB — COMPREHENSIVE METABOLIC PANEL WITH GFR
ALT: 20 U/L (ref 0–44)
AST: 21 U/L (ref 15–41)
Albumin: 4 g/dL (ref 3.5–5.0)
Alkaline Phosphatase: 71 U/L (ref 38–126)
Anion gap: 11 (ref 5–15)
BUN: 25 mg/dL — ABNORMAL HIGH (ref 8–23)
CO2: 20 mmol/L — ABNORMAL LOW (ref 22–32)
Calcium: 9.2 mg/dL (ref 8.9–10.3)
Chloride: 106 mmol/L (ref 98–111)
Creatinine, Ser: 1.41 mg/dL — ABNORMAL HIGH (ref 0.44–1.00)
GFR, Estimated: 37 mL/min — ABNORMAL LOW (ref >60)
Glucose, Bld: 128 mg/dL — ABNORMAL HIGH (ref 70–99)
Potassium: 4.6 mmol/L (ref 3.5–5.1)
Sodium: 137 mmol/L (ref 135–145)
Total Bilirubin: 1.5 mg/dL — ABNORMAL HIGH (ref 0.0–1.2)
Total Protein: 7.1 g/dL (ref 6.5–8.1)

## 2023-12-18 LAB — URINALYSIS, W/ REFLEX TO CULTURE (INFECTION SUSPECTED)
Bacteria, UA: NONE SEEN
Glucose, UA: NEGATIVE mg/dL
Hgb urine dipstick: NEGATIVE
Ketones, ur: NEGATIVE mg/dL
Nitrite: NEGATIVE
Protein, ur: NEGATIVE mg/dL
Specific Gravity, Urine: >1.03 — ABNORMAL HIGH (ref 1.005–1.030)
pH: 5.5 (ref 5.0–8.0)

## 2023-12-18 LAB — LACTIC ACID, PLASMA
Lactic Acid, Venous: 2.8 mmol/L (ref 0.5–1.9)
Lactic Acid, Venous: 1.5 mmol/L (ref 0.5–1.9)

## 2023-12-18 LAB — CBC
HCT: 40.3 % (ref 36.0–46.0)
Hemoglobin: 13.3 g/dL (ref 12.0–15.0)
MCH: 29 pg (ref 26.0–34.0)
MCHC: 33 g/dL (ref 30.0–36.0)
MCV: 88 fL (ref 80.0–100.0)
Platelets: 242 10*3/uL (ref 150–400)
RBC: 4.58 MIL/uL (ref 3.87–5.11)
RDW: 13.3 % (ref 11.5–15.5)
WBC: 5.7 10*3/uL (ref 4.0–10.5)
nRBC: 0 % (ref 0.0–0.2)

## 2023-12-18 LAB — RESP PANEL BY RT-PCR (RSV, FLU A&B, COVID)  RVPGX2
Influenza A by PCR: NEGATIVE
Influenza B by PCR: NEGATIVE
Resp Syncytial Virus by PCR: NEGATIVE
SARS Coronavirus 2 by RT PCR: NEGATIVE

## 2023-12-18 MED ORDER — SODIUM CHLORIDE 0.9% FLUSH
3.0000 mL | Freq: Two times a day (BID) | INTRAVENOUS | Status: DC
  Administered 2023-12-18 – 2023-12-19 (×2): 3 mL via INTRAVENOUS

## 2023-12-18 MED ORDER — PANTOPRAZOLE SODIUM 40 MG IV SOLR
40.0000 mg | Freq: Once | INTRAVENOUS | Status: AC
  Administered 2023-12-18: 40 mg via INTRAVENOUS
  Filled 2023-12-18: qty 10

## 2023-12-18 MED ORDER — ENOXAPARIN SODIUM 40 MG/0.4ML IJ SOSY
40.0000 mg | PREFILLED_SYRINGE | INTRAMUSCULAR | Status: DC
  Filled 2023-12-18: qty 0.4

## 2023-12-18 MED ORDER — ACETAMINOPHEN 650 MG RE SUPP: 650.0000 mg | Freq: Four times a day (QID) | RECTAL | Status: DC | PRN

## 2023-12-18 MED ORDER — LACTATED RINGERS IV SOLN: INTRAVENOUS | Status: AC

## 2023-12-18 MED ORDER — ACETAMINOPHEN 325 MG PO TABS
650.0000 mg | ORAL_TABLET | Freq: Four times a day (QID) | ORAL | Status: DC | PRN
  Administered 2023-12-19: 650 mg via ORAL
  Filled 2023-12-18 (×2): qty 2

## 2023-12-18 MED ORDER — VANCOMYCIN HCL IN DEXTROSE 1-5 GM/200ML-% IV SOLN
1000.0000 mg | Freq: Once | INTRAVENOUS | Status: AC
  Administered 2023-12-18: 1000 mg via INTRAVENOUS
  Filled 2023-12-18: qty 200

## 2023-12-18 MED ORDER — ONDANSETRON HCL 4 MG/2ML IJ SOLN
4.0000 mg | Freq: Once | INTRAMUSCULAR | Status: AC
  Administered 2023-12-18: 4 mg via INTRAVENOUS
  Filled 2023-12-18: qty 2

## 2023-12-18 MED ORDER — ONDANSETRON HCL 4 MG/2ML IJ SOLN: 4.0000 mg | Freq: Four times a day (QID) | INTRAMUSCULAR | Status: DC | PRN

## 2023-12-18 MED ORDER — ONDANSETRON HCL 4 MG PO TABS: 4.0000 mg | ORAL_TABLET | Freq: Four times a day (QID) | ORAL | Status: DC | PRN

## 2023-12-18 MED ORDER — SODIUM CHLORIDE 0.9 % IV SOLN
2.0000 g | Freq: Once | INTRAVENOUS | Status: AC
  Administered 2023-12-18: 2 g via INTRAVENOUS
  Filled 2023-12-18: qty 20

## 2023-12-18 MED ORDER — POLYETHYLENE GLYCOL 3350 17 G PO PACK: 17.0000 g | PACK | Freq: Every day | ORAL | Status: DC | PRN

## 2023-12-18 MED ORDER — SODIUM CHLORIDE 0.9 % IV BOLUS (SEPSIS)
1000.0000 mL | Freq: Once | INTRAVENOUS | Status: AC
  Administered 2023-12-18: 1000 mL via INTRAVENOUS

## 2023-12-18 NOTE — Sepsis Progress Note (Signed)
 Sepsis protocol is being followed by [facility].

## 2023-12-18 NOTE — H&P (Signed)
 History and Physical    Patient: [name] ZOX:[id number] DOB: 06/16/1941 DOA: 12/18/2023 DOS: the patient was seen and examined on 12/18/2023 PCP: Aisha Hove, MD  Patient coming from: Home  Chief Complaint:  Chief Complaint  Patient presents with   Leg Pain   Skin Infection   HPI: [name] is a 83 y.o. female with medical history significant of Hypertension, B12 deficiency, depression, mild persistent asthma, erosive gastropathy, nonobstructive CAD, who presents to the ED due to vomiting and concern for skin infection.  Ms. Kirstein states that she was at the beach this past week, she when she began to develop subjective hot and cold chills, body aches, headache, nausea, vomiting, poor appetite.  Shortly after the symptoms started, she noticed a rash on her left lower extremity.  She notes a prior history of cellulitis and notes and looks similar to this.  [place] started her on Bactrim .  She completed the course yesterday.  Her leg redness seems much improved, however she is still not feeling well.  She followed up with her normal PCP today and began vomiting at their office.  Due to this, she was sent to the ED.  She denies any chest pain, shortness of breath but endorses a cough.  She denies any abdominal pain, dysuria, urinary urgency/frequency but notes she has been urinating less over the past couple days.  She denies any diarrhea.  ED course: On arrival to the ED, patient was normotensive at 120/66 with heart rate of 64.  She was saturating at 100% on room air.  She was afebrile at 98.2.  Initial workup notable for unremarkable CBC, BUN 25, creatinine 1.4, GFR 37, and lactic acid 2.8.  COVID-19, influenza and RSV PCR negative.  Urinalysis with small leukocytes, and increased specific gravity.  COVID #19, influenza and RSV PCR negative.  Left lower extremity Doppler negative for DVT.  Patient started on IV fluids, Rocephin , vancomycin ,  Protonix .  [facility] contacted for admission.   Review of Systems: As mentioned in the history of present illness. All other systems reviewed and are negative.  Past Medical History:  Diagnosis Date   Acid reflux    Allergy    Anemia    Anxiety    in past   Arthritis    all over   Asthma    Atypical moles    Bronchitis    Bronchospasm    reactive with perfume/cologne and smoke   CAD (coronary artery disease)    patient denies on preop of 10/24    Candida esophagitis (HCC)    Carotid artery calcification, bilateral 04/2016   Carpal tunnel syndrome of left wrist 12/03/2019   Cataract    Cervical stenosis of spine    Chronic insomnia    Complication of anesthesia    shoulder surgery- not all the way asleep   COVID-19 02/04/2021   also 10/3 or 04/20/21 sxs started 04/05/21   Depression    in past   Diverticulitis    Diverticulosis    Dizziness    Elevated liver enzymes    Family history of adverse reaction to anesthesia    sister- has problems with nausea and vomiting    Fatigue    Fatty liver    Generalized headache    migraines   GERD (gastroesophageal reflux disease)    Gross hematuria 08/18/2022   Hereditary and idiopathic peripheral neuropathy 05/11/2015   History of chemotherapy  topical cream for h/o skin CA   History of kidney stones    History of neuropathy    FH Dr. Lydia Sams neurology   History of vertigo    Hx of migraines    Hyperlipidemia    Hypothyroidism    Insomnia    Irritable bowel syndrome    LLQ abdominal pain    Melanoma (HCC) 2013   Melanoma (HCC)    x2 stg II melanoma   Neuropathy, peripheral    Peripheral neuropathy    Pneumonia    hx of x 2   PONV (postoperative nausea and vomiting)    Pre-diabetes    Retina disorder    left   SCC (squamous cell carcinoma)    skin    Skin cancer 07/17/2019   SCC left leg s/p mohs   SCC 05/01/18 left medial mid leg, right post mid leg 06/08/18, left mid well 04/10/19  Dr. Joanne Muckle derm, Dr. Eva Hikes  mohs       Stroke [facility])    ? TIA per Dr V per patient    Thyroid  disease    Urine incontinence    Viral URI with cough 06/09/2022   Vitamin D  deficiency    Weight gain    Past Surgical History:  Procedure Laterality Date   ABLATION Bilateral    Dr. Vonna Guardian 12/2020 veins laser ablation of both great saphenous veins in a staged fashion.   BILATERAL SALPINGOOPHORECTOMY     ovarian cyst Dr. Alva Jewels and Dr. Willy Harvest 2008   BREAST SURGERY     in 2014 bx breast calcifications   BREAST SURGERY     augmentation with saline Dr. Ceclia Cohens 1995    CHOLECYSTECTOMY N/A 01/28/2015   Procedure: LAPAROSCOPIC CHOLECYSTECTOMY WITH INTRAOPERATIVE CHOLANGIOGRAM;  Surgeon: Lillette Reid III, MD;  Location: [facility] OR;  Service: General;  Laterality: N/A;   CLOSED MANIPULATION SHOULDER     rt   COLONOSCOPY WITH PROPOFOL  N/A 11/20/2012   Procedure: COLONOSCOPY WITH PROPOFOL ;  Surgeon: Garrett Kallman, MD;  Location: [facility] ENDOSCOPY;  Service: Endoscopy;  Laterality: N/A;   colonscopy     May 2014   FOOT BONE EXCISION     JOINT REPLACEMENT     RTK   KNEE ARTHROSCOPY     left knee miniscus tear repair     MELANOMA EXCISION WITH SENTINEL LYMPH NODE BIOPSY  03/18/2011   Back, nodes neg   OOPHORECTOMY     ROTATOR CUFF REPAIR     left   SHOULDER SURGERY     right and left    SKIN CANCER EXCISION     multiple   TOTAL HIP ARTHROPLASTY     TOTAL KNEE ARTHROPLASTY     right 2010 and left in 2018    TOTAL KNEE ARTHROPLASTY Left 05/16/2017   Procedure: LEFT TOTAL KNEE ARTHROPLASTY;  Surgeon: Claiborne Crew, MD;  Location: [facility] ORS;  Service: Orthopedics;  Laterality: Left;  70 mins   TUBAL LIGATION     TUMOR REMOVAL     ovary   Social History:  reports that she has never smoked. She has never used smokeless tobacco. She reports current alcohol use of about 1.0 standard drink of alcohol per week. She reports that she does not use drugs.  Allergies  Allergen Reactions   Epinephrine  Other (See Comments) and  Anaphylaxis    Heart palpitations   Other Anaphylaxis    Black pepper- caused bronchial tubes to start closing    Penicillins Swelling and Other (  See Comments)    Tolerated rocephin  within past year Lip swelling Has patient had a PCN reaction causing immediate rash, facial/tongue/throat swelling, SOB or lightheadedness with hypotension: Yes Has patient had a PCN reaction causing severe rash involving mucus membranes or skin necrosis: No Has patient had a PCN reaction that required hospitalization No Has patient had a PCN reaction occurring within the last 10 years: No If all of the above answers are "NO", then may proceed with Cephalosporin use.   Valium [Diazepam] Other (See Comments)    Other reaction(s): Other (See Comments) Overly sensitive  sedation    Family History  Problem Relation Age of Onset   Hypertension Mother    Stroke Mother    Hyperlipidemia Mother    Heart disease Father    Diabetes Father    Hypertension Father    Hyperlipidemia Father    Early death Father    Diabetes Sister    Heart disease Sister        heart murmur   Arthritis Sister    COPD Sister    Hyperlipidemia Sister    Hypertension Sister    Colon polyps Sister        adenomous   Arthritis Sister    Heart disease Sister    Hyperlipidemia Sister    Hypertension Sister    Cancer Sister    Dementia Sister    Tremor Sister    Stroke Maternal Grandmother    Colon cancer Maternal Grandfather    Cancer Maternal Grandfather    Early death Maternal Grandfather    Early death Paternal Grandmother    Stroke Paternal Grandmother    Heart disease Daughter    Hyperlipidemia Daughter    Hypertension Daughter    Heart disease Son    Hyperlipidemia Son    Alcohol abuse Son    Depression Son    Diabetes Son    Pancreatic cancer Maternal Uncle    Alzheimer's disease Paternal Aunt    Alzheimer's disease Paternal Aunt    Alzheimer's disease Paternal Aunt    Alzheimer's disease Paternal Aunt     Cancer Other        2 paternal uncles and 1 maternal uncle colon cancer   Esophageal cancer Neg Hx    Stomach cancer Neg Hx     Prior to Admission medications   Medication Sig Start Date End Date Taking? Authorizing Provider  cholestyramine  (QUESTRAN ) 4 g packet Take 4 g by mouth 2 (two) times daily as needed. 04/20/23  Yes [provider]  predniSONE  (DELTASONE ) 10 MG tablet Take 10 mg by mouth daily with breakfast. 10/20/22  Yes [provider]  sulfamethoxazole -trimethoprim  (BACTRIM  DS) 800-160 MG tablet Take 1 tablet by mouth 2 (two) times daily. 12/12/23  Yes [provider]  albuterol  (PROAIR  HFA) 108 (90 Base) MCG/ACT inhaler Inhale 1-2 puffs into the lungs every 6 (six) hours as needed for wheezing or shortness of breath. 11/20/23   Aisha Hove, MD  aspirin  81 MG tablet TAKE 1 TABLET BY MOUTH EVERY DAY 04/23/18   [provider]  atorvastatin  (LIPITOR) 20 MG tablet Take 1 tablet (20 mg total) by mouth daily. 02/27/23   Valli Gaw, MD  Biotin 1000 MCG tablet Take 1,000 mcg by mouth daily.    [provider]  BREO ELLIPTA  100-25 MCG/ACT AEPB Inhale 1 puff into the lungs daily. Inhale 1 puff by mouth once daily 11/29/23   Trenda Frisk, FNP  Calcium  600-200 MG-UNIT tablet Take  1 tablet daily by mouth.    [provider]  Cholecalciferol  (VITAMIN D3) 2000 UNITS capsule Take 2,000 Units by mouth daily.    [provider]  clindamycin  (CLEOCIN ) 150 MG capsule Take 150 mg by mouth 4 (four) times daily. 10/02/23   [provider]  DULoxetine  (CYMBALTA ) 60 MG capsule Take 1 capsule (60 mg total) by mouth daily. 10/12/23 10/11/24  Aisha Hove, MD  FOLIC ACID  PO Take by mouth daily.    [provider]  levothyroxine  (SYNTHROID ) 75 MCG tablet Take 1 tablet (75 mcg total) by mouth daily before breakfast. 02/20/23   Valli Gaw, MD  Magnesium  250 MG TABS Take by mouth.    [provider]  olmesartan  (BENICAR ) 5  MG tablet Take 2 tablets (10 mg total) by mouth 2 (two) times daily. 08/29/23   Aisha Hove, MD  Omega-3 Fatty Acids (FISH OIL) 1000 MG CAPS Take 1,000 mg by mouth daily.     [provider]  pantoprazole  (PROTONIX ) 40 MG tablet Take 40 mg by mouth daily.    [provider]  Potassium (POTASSIMIN PO) Take by mouth.    [provider]  vitamin B-12 (CYANOCOBALAMIN ) 1000 MCG tablet Take 2,000 mcg by mouth daily. 2 tabs    [provider]  Vonoprazan Fumarate  (VOQUEZNA ) 10 MG TABS Take 10 mg by mouth daily. 12/01/23   Garr Kalata, PA-C    Physical Exam: Vitals:   12/18/23 0910 12/18/23 0944 12/18/23 1050  BP:  120/66 (!) 88/57  Pulse:  64 (!) 44  Resp:  18   Temp:  98.2 F (36.8 C)   TempSrc:  Oral   SpO2:  100% 100%  Weight: 85 kg    Height: 5\' 6"  (1.676 m)     Physical Exam Vitals and nursing note reviewed.  Constitutional:      General: She is not in acute distress.    Appearance: She is normal weight. She is not toxic-appearing.  HENT:     Head: Normocephalic and atraumatic.     Mouth/Throat:     Mouth: Mucous membranes are dry.  Eyes:     Conjunctiva/sclera: Conjunctivae normal.     Pupils: Pupils are equal, round, and reactive to light.  Cardiovascular:     Rate and Rhythm: Normal rate and regular rhythm.     Heart sounds: No murmur heard.    No gallop.  Pulmonary:     Effort: Pulmonary effort is normal. No respiratory distress.     Breath sounds: Normal breath sounds. No wheezing, rhonchi or rales.  Abdominal:     General: Bowel sounds are normal. There is no distension.     Palpations: Abdomen is soft.     Tenderness: There is no abdominal tenderness. There is no guarding.  Musculoskeletal:     Right lower leg: No edema.     Left lower leg: Edema (trace, non-pitting) present.  Skin:    General: Skin is warm and dry.     Comments: Mild erythema of the right lower calf that is circumferential with no clear area of  demarcation.  No wounds, ulcers or petechiae  Neurological:     General: No focal deficit present.     Mental Status: She is alert and oriented to person, place, and time. Mental status is at baseline.  Psychiatric:        Mood and Affect: Mood normal.        Behavior: Behavior normal.  Data Reviewed: CBC with WBC of 5.7, hemoglobin of 13.3, platelets of 242 CMP with sodium of 137, potassium 4.6, bicarb 20, glucose 128, BUN 25, creatinine 1.41, AST 21, ALT 20, GFR 37 Lactic acid 2.8 in the 1.5 Cova-19, influenza and RSV PCR negative Urinalysis with small leukocytes, no bacteria, nitrites.  Increased specific gravity.  US  Venous Img Lower Unilateral Left Result Date: 12/18/2023 CLINICAL DATA:  Left lower extremity pain, swelling and cellulitis EXAM: LEFT LOWER EXTREMITY VENOUS DOPPLER ULTRASOUND TECHNIQUE: Gray-scale sonography with compression, as well as color and duplex ultrasound, were performed to evaluate the deep venous system(s) from the level of the common femoral vein through the popliteal and proximal calf veins. COMPARISON:  None Available. FINDINGS: VENOUS Normal compressibility of the common femoral, superficial femoral, and popliteal veins, as well as the visualized calf veins. Visualized portions of profunda femoral vein and great saphenous vein unremarkable. No filling defects to suggest DVT on grayscale or color Doppler imaging. Doppler waveforms show normal direction of venous flow, normal respiratory plasticity and response to augmentation. Limited views of the contralateral common femoral vein are unremarkable. OTHER None. Limitations: none IMPRESSION: Negative. Electronically Signed   By: Fernando Hoyer M.D.   On: 12/18/2023 13:23   There are no new results to review at this time.  Assessment and Plan:  * AKI (acute kidney injury) (HCC) Patient is presenting with mild AKI, likely multifactorial in the setting of poor p.o. intake, vomiting, and then Bactrim .  Appears  hypovolemic on examination.  - IV fluids as ordered - Hold nephrotoxic agents - Recheck BMP in the a.m.  Nausea and vomiting Patient reports approximately 1 week of nausea with vomiting, although not vomiting daily.  In addition, she has been experiencing hot/cold chills, body aches, headache, nonproductive cough and generalized malaise.  She has been tested for COVID and influenza twice with negative tests.  Overall, most consistent with viral etiology.  Unclear if this is related to her treated cellulitis.  - Supportive management with IV fluids as ordered, Zofran , Tylenol   Cellulitis Patient states she has a history of recurrent left lower extremity cellulitis, for which she was started on Bactrim  1 week ago and completed the course yesterday.  She notes that her leg looks much better.  On examination, there is only very faint residual erythema left with no evidence of active infection.  She has received Rocephin  and vancomycin  in the ED.  Will hold off on further antibiotics.  Essential hypertension, benign - Hold home Benicar  in the setting of AKI  Advance Care Planning:   Code Status: Full Code   Consults: None  Family Communication: None  Severity of Illness: The appropriate patient status for this patient is OBSERVATION. Observation status is judged to be reasonable and necessary in order to provide the required intensity of service to ensure the patient's safety. The patient's presenting symptoms, physical exam findings, and initial radiographic and laboratory data in the context of their medical condition is felt to place them at decreased risk for further clinical deterioration. Furthermore, it is anticipated that the patient will be medically stable for discharge from the hospital within 2 midnights of admission.   Author: Avi Body, MD 12/18/2023 2:05 PM  For on call review [email or web address].

## 2023-12-18 NOTE — Assessment & Plan Note (Addendum)
 Patient reports approximately 1 week of nausea with vomiting, although not vomiting daily.  In addition, she has been experiencing hot/cold chills, body aches, headache, nonproductive cough and generalized malaise.  She has been tested for COVID and influenza twice with negative tests.  Overall, most consistent with viral etiology.  Unclear if this is related to her treated cellulitis.  - Supportive management with IV fluids as ordered, Zofran , Tylenol 

## 2023-12-18 NOTE — ED Notes (Addendum)
 This paramedic called the lab for assistance collecting 2nd set of blood cultures. Antibiotics started after 1st set of cultures collected by IV team.

## 2023-12-18 NOTE — Plan of Care (Signed)

## 2023-12-18 NOTE — Assessment & Plan Note (Signed)
-   Hold home Benicar  in the setting of AKI

## 2023-12-18 NOTE — Assessment & Plan Note (Signed)
 Patient is presenting with mild AKI, likely multifactorial in the setting of poor p.o. intake, vomiting, and then Bactrim .  Appears hypovolemic on examination.  - IV fluids as ordered - Hold nephrotoxic agents - Recheck BMP in the a.m.

## 2023-12-18 NOTE — ED Notes (Signed)
 Pt assisted with ambulating to the restroom and back.   US  in with pt at this time.

## 2023-12-18 NOTE — Consult Note (Signed)
 CODE SEPSIS - PHARMACY COMMUNICATION  **Broad Spectrum Antibiotics should be administered within 1 hour of Sepsis diagnosis**  Time Code Sepsis Called/Page Received: 1038  Antibiotics Ordered: ceftriaxone  + vancomycin   Time of 1st antibiotic administration: 1107  Additional action taken by pharmacy: None     Trinidad Funk ,PharmD Clinical Pharmacist  12/18/2023  11:13 AM

## 2023-12-18 NOTE — ED Notes (Signed)
 Pt is CAOx4, breathing normally, and normal in color. Pt is lying in bed and does not appear to be in any distress at this time. Pt is complaining of left leg pain and cellulitis. Pt states she has been taken antibiotics for same but without relief.

## 2023-12-18 NOTE — ED Provider Notes (Signed)
 [facility] Provider Note    Event Date/Time   First MD Initiated Contact with Patient 12/18/23 1009     (approximate)   History   Leg Pain and Skin Infection   HPI  [name] is a 83 y.o. female past medical history significant for esophagitis, presents to the emergency department with leg swelling and pain.  Patient states that approximately 8 days ago started having fever, chills and was not feeling well and thought that she had influenza.  Developed a rash to her left lower leg while she was at the beach.  States that she was seen at urgent care and started on Bactrim .  States that she has had improvement of the rash but has ongoing symptoms of nausea, vomiting and not feeling well with chills.  Was evaluated urgent care today and had further episodes of vomiting and was sent to the emergency department for further evaluation.  Denies fever.  No prior history of DVT or PE.  No shortness of breath or chest pain.  Denies any falls or trauma.  No known tick bites or tick exposure.  Patient completed a course of Bactrim .     Physical Exam   Triage Vital Signs: ED Triage Vitals  Encounter Vitals Group     BP 12/18/23 0944 120/66     Systolic BP Percentile --      Diastolic BP Percentile --      Pulse Rate 12/18/23 0944 64     Resp 12/18/23 0944 18     Temp 12/18/23 0944 98.2 F (36.8 C)     Temp Source 12/18/23 0944 Oral     SpO2 12/18/23 0944 100 %     Weight 12/18/23 0910 187 lb 6.3 oz (85 kg)     Height 12/18/23 0910 5\' 6"  (1.676 m)     Head Circumference --      Peak Flow --      Pain Score 12/18/23 0910 6     Pain Loc --      Pain Education --      Exclude from Growth Chart --     Most recent vital signs: Vitals:   12/18/23 0944  BP: 120/66  Pulse: 64  Resp: 18  Temp: 98.2 F (36.8 C)  SpO2: 100%    Physical Exam Constitutional:      Appearance: She is well-developed.  HENT:     Head: Atraumatic.  Eyes:      Conjunctiva/sclera: Conjunctivae normal.  Cardiovascular:     Rate and Rhythm: Regular rhythm.  Pulmonary:     Effort: No respiratory distress.  Abdominal:     General: There is no distension.  Musculoskeletal:        General: Normal range of motion.     Cervical back: Normal range of motion.  Skin:    General: Skin is warm.     Capillary Refill: Capillary refill takes less than 2 seconds.     Findings: Rash present.     Comments: Erythema and warm to the left lower leg.  Neurological:     Mental Status: She is alert. Mental status is at baseline.  Psychiatric:        Mood and Affect: Mood normal.     IMPRESSION / MDM / ASSESSMENT AND PLAN / ED COURSE  I reviewed the triage vital signs and the nursing notes.  Differential diagnosis including complex cellulitis that has failed outpatient antibiotics, dehydration, medication side effect, side effect of  esophagitis, DVT  RADIOLOGY Ultrasound DVT currently pending  LABS (all labs ordered are listed, but only abnormal results are displayed) Labs interpreted as -    Labs Reviewed  COMPREHENSIVE METABOLIC PANEL WITH GFR - Abnormal; Notable for the following components:      Result Value   CO2 20 (*)    Glucose, Bld 128 (*)    BUN 25 (*)    Creatinine, Ser 1.41 (*)    Total Bilirubin 1.5 (*)    GFR, Estimated 37 (*)    All other components within normal limits  LACTIC ACID, PLASMA - Abnormal; Notable for the following components:   Lactic Acid, Venous 2.8 (*)    All other components within normal limits  CULTURE, BLOOD (ROUTINE X 2)  CULTURE, BLOOD (ROUTINE X 2)  CBC  LACTIC ACID, PLASMA  URINALYSIS, W/ REFLEX TO CULTURE (INFECTION SUSPECTED)     MDM  Patient with vomiting prior to arrival.  Patient with no leukocytosis but does have a lactic acid that is elevated at 2.8.  Found to have acute kidney injury with a creatinine elevated to 1.4 from a baseline of 1.  Does have an elevated BUN of 25.  Felt that 30 cc/kg  of IV fluids may be detrimental given her good perfusion, will give 1 L of IV fluids and reevaluate.  Difficult IV stick, delay of blood cultures and did not want to delay antibiotics given that it could be detrimental for the patient.  Started on IV vancomycin  and Rocephin  to cover for nonpurulent cellulitis.  Low suspicion for necrotizing soft tissue infection.  Acute kidney injury could be in the setting of dehydration from vomiting versus side effects of Bactrim .  Given that she is already completed a course of antibiotics with ongoing symptoms and elevated lactic acid we will admit for complicated cellulitis.  Ultrasound DVT obtained given her recent travel to the beach but have a lower suspicion.  Consulted hospitalist for admission.     PROCEDURES:  Critical Care performed: No  Procedures  Patient's presentation is most consistent with acute presentation with potential threat to life or bodily function.   MEDICATIONS ORDERED IN ED: Medications  vancomycin  (VANCOCIN ) IVPB 1000 mg/200 mL premix (1,000 mg Intravenous New Bag/Given 12/18/23 1140)  pantoprazole  (PROTONIX ) injection 40 mg (has no administration in time range)  sodium chloride  0.9 % bolus 1,000 mL (1,000 mLs Intravenous New Bag/Given 12/18/23 1108)  ondansetron  (ZOFRAN ) injection 4 mg (4 mg Intravenous Given 12/18/23 1110)  cefTRIAXone  (ROCEPHIN ) 2 g in sodium chloride  0.9 % 100 mL IVPB (0 g Intravenous Stopped 12/18/23 1138)    FINAL CLINICAL IMPRESSION(S) / ED DIAGNOSES   Final diagnoses:  AKI (acute kidney injury) (HCC)  Dehydration  Cellulitis of left lower extremity     Rx / DC Orders   ED Discharge Orders     None        Note:  This document was prepared using Dragon voice recognition software and may include unintentional dictation errors.   Viviano Ground, MD 12/18/23 1213

## 2023-12-18 NOTE — ED Notes (Signed)
 IV team consult made due to difficult vascular access. Will draw cultures and start antibiotics when access obtained.

## 2023-12-18 NOTE — Assessment & Plan Note (Addendum)
 Patient states she has a history of recurrent left lower extremity cellulitis, for which she was started on Bactrim  1 week ago and completed the course yesterday.  She notes that her leg looks much better.  On examination, there is only very faint residual erythema left with no evidence of active infection.  She has received Rocephin  and vancomycin  in the ED.  Will hold off on further antibiotics.

## 2023-12-18 NOTE — ED Triage Notes (Signed)
 Pt here from [facility] clinic, pt has c/o of redness and swelling to L shin. Pt given ABX for cellulitis. [facility] clinic states pt started vomiting. Pt and [facility] clinic denies fevers but endorses chills. VSS at [facility] clinic.

## 2023-12-19 DIAGNOSIS — N179 Acute kidney failure, unspecified: Secondary | ICD-10-CM | POA: Diagnosis not present

## 2023-12-19 LAB — BASIC METABOLIC PANEL WITH GFR
Anion gap: 6 (ref 5–15)
BUN: 27 mg/dL — ABNORMAL HIGH (ref 8–23)
CO2: 22 mmol/L (ref 22–32)
Calcium: 8.5 mg/dL — ABNORMAL LOW (ref 8.9–10.3)
Chloride: 109 mmol/L (ref 98–111)
Creatinine, Ser: 1.19 mg/dL — ABNORMAL HIGH (ref 0.44–1.00)
GFR, Estimated: 45 mL/min — ABNORMAL LOW (ref >60)
Glucose, Bld: 111 mg/dL — ABNORMAL HIGH (ref 70–99)
Potassium: 4.9 mmol/L (ref 3.5–5.1)
Sodium: 137 mmol/L (ref 135–145)

## 2023-12-19 MED ORDER — ATORVASTATIN CALCIUM 20 MG PO TABS
20.0000 mg | ORAL_TABLET | Freq: Every day | ORAL | Status: DC
  Administered 2023-12-19: 20 mg via ORAL
  Filled 2023-12-19: qty 1

## 2023-12-19 MED ORDER — LEVOTHYROXINE SODIUM 50 MCG PO TABS
75.0000 ug | ORAL_TABLET | Freq: Every day | ORAL | Status: DC
  Administered 2023-12-19: 75 ug via ORAL
  Filled 2023-12-19: qty 1

## 2023-12-19 MED ORDER — DULOXETINE HCL 30 MG PO CPEP
60.0000 mg | ORAL_CAPSULE | Freq: Every day | ORAL | Status: DC
  Administered 2023-12-19: 60 mg via ORAL
  Filled 2023-12-19: qty 2

## 2023-12-19 MED ORDER — FAMOTIDINE 20 MG PO TABS
20.0000 mg | ORAL_TABLET | Freq: Two times a day (BID) | ORAL | Status: DC | PRN
  Administered 2023-12-19: 20 mg via ORAL
  Filled 2023-12-19: qty 1

## 2023-12-19 MED ORDER — VONOPRAZAN FUMARATE 10 MG PO TABS: 10.0000 mg | ORAL_TABLET | Freq: Every day | ORAL | Status: DC

## 2023-12-19 MED ORDER — PANTOPRAZOLE SODIUM 40 MG PO TBEC
40.0000 mg | DELAYED_RELEASE_TABLET | Freq: Every day | ORAL | Status: DC
  Administered 2023-12-19: 40 mg via ORAL
  Filled 2023-12-19: qty 1

## 2023-12-19 NOTE — TOC Initial Note (Signed)
 Transition of Care [facility]) - Initial/Assessment Note    Patient Details  Name: [name] MRN: [id number] Date of Birth: 1941/05/09  Transition of Care [facility]) CM/SW Contact:    Crayton Docker, RN 12/19/2023, 1:17 PM  Clinical Narrative:                  CM to patient's room regarding TOC screening assessment. CM introduced case management role and discharge care planning process. Patient verbalized understanding and agreement with screening interview. Patient states lives alone with 1 puppy--vaccinated. Patient states uses a cane, compression devices 2 hours daily and shower bench--built in. Per patient, patient's son, Wandalee Gust will provide transportation at discharge.    CM and patient discussed Outpatient PT referral. Patient stated has previous experience with [facility]. CM faxed Outpatient PT referral to [facility], fax: [phone] with ok in transmission.  Expected Discharge Plan: Home/Self Care Barriers to Discharge: No Barriers Identified   Patient Goals and CMS Choice    Home/self care   Expected Discharge Plan and Services    Home/self care     Expected Discharge Date: 12/19/23                  Prior Living Arrangements/Services   Lives with:: Pets, Self Patient language and need for interpreter reviewed:: No Do you feel safe going back to the place where you live?: Yes      Need for Family Participation in Patient Care: Yes (Comment) Care giver support system in place?: Yes (comment) Current home services: DME (per patient comment, 1 cane; built in shower bench and compression devices 2 hours daily) Criminal Activity/Legal Involvement Pertinent to Current Situation/Hospitalization: No - Comment as needed  Activities of Daily Living   ADL Screening (condition at time of admission) Independently performs ADLs?: Yes (appropriate for developmental age) Is the patient deaf or have difficulty hearing?: No Does the patient have difficulty seeing, even  when wearing glasses/contacts?: No Does the patient have difficulty concentrating, remembering, or making decisions?: No  Permission Sought/Granted         Emotional Assessment Appearance:: Appears younger than stated age Attitude/Demeanor/Rapport: Engaged Affect (typically observed): Calm Orientation: : Oriented to Self, Oriented to Place, Oriented to  Time, Oriented to Situation      Admission diagnosis:  Dehydration [E86.0] Cellulitis [L03.90] Cellulitis of left lower extremity [L03.116] AKI (acute kidney injury) (HCC) [N17.9] Patient Active Problem List   Diagnosis Date Noted   Cellulitis 12/18/2023   AKI (acute kidney injury) (HCC) 12/18/2023   Nausea and vomiting 12/18/2023   GAD (generalized anxiety disorder) 10/19/2023   Major depressive disorder with single episode, in partial remission (HCC) 10/19/2023   Vitamin B12 deficiency 08/10/2023   Mild persistent asthma 06/23/2023   Screening for osteoporosis 06/02/2023   Left leg pain 06/02/2023   Dizziness 06/02/2023   Fall 01/01/2023   Bronchitis 10/20/2022   Acute cystitis with hematuria 09/17/2022   Vitamin D  deficiency 10/12/2021   Migraine 09/06/2021   Seasonal allergies 09/06/2021   TMJ dysfunction 03/04/2021   Anxiety and depression 01/12/2021   Insomnia 01/12/2021   Fatigue 01/12/2021   Varicose veins of leg with pain, bilateral 10/06/2020   Cerebrovascular accident (CVA) (HCC) 09/22/2020   Lymphedema 08/21/2020   Abnormal CT of the chest 05/07/2020   Dyspnea on exertion 05/07/2020   Diverticulosis 02/28/2020   Obesity (BMI 30-39.9) 02/28/2020   SCCA (squamous cell carcinoma) of skin 01/03/2020   Dry eyes 12/05/2019   Liver cyst 11/01/2019  Essential hypertension, benign 11/01/2019   Carotid artery stenosis 08/13/2019   Cervical spinal stenosis 08/13/2019   Overactive bladder 03/08/2019   Cough in adult patient 02/19/2019   Irritable bowel syndrome with diarrhea 02/19/2019   Depression, recurrent  (HCC) 02/19/2019   Lumbosacral spondylosis without myelopathy 12/06/2018   Aortic atherosclerosis (HCC) 10/10/2018   Fatty liver 09/13/2018   GERD (gastroesophageal reflux disease) 09/13/2018   Prediabetes 09/13/2018   Hiatal hernia 09/13/2018   Retinal drusen of both eyes 12/14/2017   Asthma in remission 05/19/2017   Female stress incontinence 05/08/2017   Hyperlipidemia 04/09/2017   MCI (mild cognitive impairment) 12/17/2015   Urine frequency 08/09/2015   Hypothyroidism 08/09/2015   Posterior vitreous detachment of right eye 07/23/2015   Epiretinal membrane, left 04/12/2012   Nuclear cataract 04/12/2012   Age-related nuclear cataract, bilateral 04/12/2012   PCP:  Aisha Hove, MD Pharmacy:   [facility] [address] [place] - [address] [place] [address] Phone: [phone] Fax: [phone]  [facility] [place] - [id number] [phone] [place] [address] Phone: [phone] Fax: [phone]     Social Drivers of Health (SDOH) Social History: SDOH Screenings   Food Insecurity: No Food Insecurity (12/18/2023)  Housing: Low Risk  (12/18/2023)  Transportation Needs: No Transportation Needs (12/18/2023)  Utilities: Not At Risk (12/18/2023)  Alcohol Screen: Low Risk  (04/18/2023)  Depression (PHQ2-9): Medium Risk (08/10/2023)  Financial Resource Strain: Low Risk  (04/18/2023)  Physical Activity: Inactive (04/18/2023)  Social Connections: Moderately Integrated (12/18/2023)  Stress: Stress Concern Present (04/18/2023)  Tobacco Use: Low Risk  (12/18/2023)  Health Literacy: Inadequate Health Literacy (04/18/2023)   SDOH Interventions:     Readmission Risk Interventions     No data to display

## 2023-12-19 NOTE — Evaluation (Signed)
 Occupational Therapy Evaluation Patient Details Name: [name] MRN: [id number] DOB: 06-17-1941 Today's Date: 12/19/2023   History of Present Illness   83 y.o. female with medical history significant of Hypertension, B12 deficiency, depression, mild persistent asthma, erosive gastropathy, nonobstructive CAD, who presents to the ED from PCP due to vomiting and concern for skin infection.   Clinical Impressions Pt seen for OT evaluation. Pt pleasant, agreeable. Reports being indep at baseline, has a dog, uses SPC vs 3PC in the community, PRN furniture walking in the home, enjoys gardening, and just got back from the beach. Pt lives in a level entry 1 story townhome alone and endorses having good neighbors that she can call on for assist. Pt completes dressing, grooming, and toileting with mod indep, SPC for mobility requiring supv as she is a bit unsteady but improves with time/movement. Pt endorses hx of decreased balance due to "vestibular issues." Pt left with PT for further assessment. No acute OT needs identified. Do not anticipate OT needs upon discharge. Will sign off.     Functional Status Assessment   Patient has had a recent decline in their functional status and demonstrates the ability to make significant improvements in function in a reasonable and predictable amount of time.     Equipment Recommendations   None recommended by OT      Precautions/Restrictions   Precautions Precautions: Fall Recall of Precautions/Restrictions: Intact Restrictions Weight Bearing Restrictions Per Provider Order: No     Mobility Bed Mobility Overal bed mobility: Modified Independent    Transfers Overall transfer level: Needs assistance Equipment used: Straight cane Transfers: Sit to/from Stand Sit to Stand: Supervision        Balance Overall balance assessment: Needs assistance Sitting-balance support: Feet supported, No upper extremity supported Sitting balance-Leahy  Scale: Good     Standing balance support: Reliant on assistive device for balance, Single extremity supported Standing balance-[name]: Fair Standing balance comment: fair- without UE support, improved to fair with time and SPC; pt endorses hx of "vestibular" issues impacting balance         ADL either performed or assessed with clinical judgement   ADL Overall ADL's : Modified independent             Extremity/Trunk Assessment Upper Extremity Assessment Upper Extremity Assessment: Overall WFL for tasks assessed;Generalized weakness   Lower Extremity Assessment Lower Extremity Assessment: Defer to PT evaluation       Communication Communication Communication: No apparent difficulties   Cognition Arousal: Alert Behavior During Therapy: WFL for tasks assessed/performed Cognition: No apparent impairments     Following commands: Intact       Cueing  General Comments   Cueing Techniques: Verbal cues              Home Living Family/patient expects to be discharged to:: Private residence Living Arrangements: Alone Available Help at Discharge: Neighbor;Friend(s);Available PRN/intermittently Type of Home: House (townhouse) Home Access: Level entry     Home Layout: One level     Bathroom Shower/Tub: Producer, television/film/video: Handicapped height     Home Equipment: Shower seat - built in;Cane - single point;Other (comment)   Additional Comments: 2 point cane      Prior Functioning/Environment Prior Level of Function : Driving;History of Falls (last six months);Independent/Modified Independent   Mobility Comments: SPC vs 3PC in the community, otherwise no AD; 1 fall recently - rolled out of bed having a bad dream ADLs Comments: indep, has a dog, enjoys  gardening    OT Problem List: Impaired balance (sitting and/or standing)        OT Goals(Current goals can be found in the care plan section)   Acute Rehab OT Goals Patient Stated  Goal: go home to my puppy OT Goal Formulation: All assessment and education complete, DC therapy   AM-PAC OT "6 Clicks" Daily Activity     Outcome Measure Help from another person eating meals?: None Help from another person taking care of personal grooming?: None Help from another person toileting, which includes using toliet, bedpan, or urinal?: None Help from another person bathing (including washing, rinsing, drying)?: None Help from another person to put on and taking off regular upper body clothing?: None Help from another person to put on and taking off regular lower body clothing?: None 6 Click Score: 24   End of Session    Activity Tolerance: Patient tolerated treatment well Patient left: Other (comment) (ambulating with PT)  OT Visit Diagnosis: Unsteadiness on feet (R26.81)                Time: 1610-9604 OT Time Calculation (min): 11 min Charges:  OT General Charges $OT Visit: 1 Visit OT Evaluation $OT Eval Low Complexity: 1 Low  Berenda Breaker., MPH, MS, OTR/L ascom [phone] 12/19/23, 10:47 AM

## 2023-12-19 NOTE — Plan of Care (Signed)

## 2023-12-19 NOTE — Evaluation (Signed)
 Physical Therapy Evaluation Patient Details Name: [name] MRN: [id number] DOB: 1941/07/13 Today's Date: 12/19/2023  History of Present Illness  83 y.o. female with medical history significant of Hypertension, B12 deficiency, depression, mild persistent asthma, erosive gastropathy, nonobstructive CAD, who presents to the ED from PCP due to vomiting and concern for skin infection.  Clinical Impression  Patient admitted with the above. PTA, patient lives alone and was independent at baseline with use of SPC vs 3-point cane in community and PRN furniture walks in the home. Patient able to complete sit to stand with supervision. General unsteadiness noted with patient endorsing that she has an ongoing vestibular diagnosis (denies vertigo). Ambulated 250' with supervision and cane with patient drifting L/R throughout. Patient will benefit from skilled PT services during acute stay to address listed deficits. Patient will benefit from ongoing therapy at discharge to maximize functional independence and safety.       If plan is discharge home, recommend the following:     Can travel by private vehicle        Equipment Recommendations None recommended by PT  Recommendations for Other Services       Functional Status Assessment Patient has had a recent decline in their functional status and demonstrates the ability to make significant improvements in function in a reasonable and predictable amount of time.     Precautions / Restrictions Precautions Precautions: Fall Recall of Precautions/Restrictions: Intact Restrictions Weight Bearing Restrictions Per Provider Order: No      Mobility  Bed Mobility Overal bed mobility: Modified Independent                  Transfers Overall transfer level: Needs assistance Equipment used: Straight cane Transfers: Sit to/from Stand Sit to Stand: Supervision                Ambulation/Gait Ambulation/Gait assistance:  Supervision Gait Distance (Feet): 250 Feet Assistive device: Straight cane Gait Pattern/deviations: Step-through pattern, Decreased stride length Gait velocity: decreased     General Gait Details: supervision for safety. Slight drifting L/R dueing mobility  Stairs            Wheelchair Mobility     Tilt Bed    Modified Rankin (Stroke Patients Only)       Balance Overall balance assessment: Mild deficits observed, not formally tested                                           Pertinent Vitals/Pain Pain Assessment Pain Assessment: No/denies pain    Home Living Family/patient expects to be discharged to:: Private residence Living Arrangements: Alone Available Help at Discharge: Neighbor;Friend(s);Available PRN/intermittently Type of Home: House (townhouse) Home Access: Level entry       Home Layout: One level Home Equipment: Shower seat - built in;Cane - single point;Other (comment) Additional Comments: 2 point cane    Prior Function Prior Level of Function : Driving;History of Falls (last six months);Independent/Modified Independent             Mobility Comments: SPC vs 3PC in the community, otherwise no AD; 1 fall recently - rolled out of bed having a bad dream ADLs Comments: indep, has a dog, enjoys gardening     Extremity/Trunk Assessment   Upper Extremity Assessment Upper Extremity Assessment: Defer to OT evaluation    Lower Extremity Assessment Lower Extremity Assessment: Generalized weakness  Cervical / Trunk Assessment Cervical / Trunk Assessment: Normal  Communication   Communication Communication: No apparent difficulties    Cognition Arousal: Alert Behavior During Therapy: WFL for tasks assessed/performed   PT - Cognitive impairments: No apparent impairments                         Following commands: Intact       Cueing Cueing Techniques: Verbal cues     General Comments      Exercises      Assessment/Plan    PT Assessment Patient needs continued PT services  PT Problem List Decreased strength;Decreased activity tolerance;Decreased balance;Decreased mobility       PT Treatment Interventions DME instruction;Gait training;Functional mobility training;Therapeutic activities;Therapeutic exercise;Balance training;Patient/family education    PT Goals (Current goals can be found in the Care Plan section)  Acute Rehab PT Goals Patient Stated Goal: to go home PT Goal Formulation: With patient Time For Goal Achievement: 01/02/24 Potential to Achieve Goals: Good    Frequency Min 1X/week     Co-evaluation               AM-PAC PT "6 Clicks" Mobility  Outcome Measure Help needed turning from your back to your side while in a flat bed without using bedrails?: None Help needed moving from lying on your back to sitting on the side of a flat bed without using bedrails?: None Help needed moving to and from a bed to a chair (including a wheelchair)?: A Little Help needed standing up from a chair using your arms (e.g., wheelchair or bedside chair)?: A Little Help needed to walk in hospital room?: A Little Help needed climbing 3-5 steps with a railing? : A Little 6 Click Score: 20    End of Session   Activity Tolerance: Patient tolerated treatment well Patient left: in bed;with call bell/phone within reach;with bed alarm set Nurse Communication: Mobility status PT Visit Diagnosis: Unsteadiness on feet (R26.81);Muscle weakness (generalized) (M62.81)    Time: 1610-9604 PT Time Calculation (min) (ACUTE ONLY): 10 min   Charges:   PT Evaluation $PT Eval Low Complexity: 1 Low   PT General Charges $$ ACUTE PT VISIT: 1 Visit         Janine Melbourne, PT, DPT Physical Therapist - [facility]  [facility]   Ein Rijo A Keldan Eplin 12/19/2023, 10:50 AM

## 2023-12-19 NOTE — Care Management Obs Status (Signed)
 MEDICARE OBSERVATION STATUS NOTIFICATION   Patient Details  Name: [name] MRN: [id number] Date of Birth: 1941/02/08   Medicare Observation Status Notification Given:  Rudolph Cost, CMA 12/19/2023, 11:43 AM

## 2023-12-19 NOTE — TOC Transition Note (Signed)
 Transition of Care [facility]) - Discharge Note   Patient Details  Name: [name] MRN: [id number] Date of Birth: Oct 20, 1940  Transition of Care [facility]) CM/SW Contact:  Crayton Docker, RN 12/19/2023, 1:29 PM   Clinical Narrative:     Discharge orders noted for home. Private transportation arranged.   Final next level of care: Home/Self Care Barriers to Discharge: No Barriers Identified   Patient Goals and CMS Choice    Home/self care Outpatient PT/[facility]  Discharge Placement        Home/self care        Discharge Plan and Services Additional resources added to the After Visit Summary for     Social Drivers of Health (SDOH) Interventions SDOH Screenings   Food Insecurity: No Food Insecurity (12/18/2023)  Housing: Low Risk  (12/18/2023)  Transportation Needs: No Transportation Needs (12/18/2023)  Utilities: Not At Risk (12/18/2023)  Alcohol Screen: Low Risk  (04/18/2023)  Depression (PHQ2-9): Medium Risk (08/10/2023)  Financial Resource Strain: Low Risk  (04/18/2023)  Physical Activity: Inactive (04/18/2023)  Social Connections: Moderately Integrated (12/18/2023)  Stress: Stress Concern Present (04/18/2023)  Tobacco Use: Low Risk  (12/18/2023)  Health Literacy: Inadequate Health Literacy (04/18/2023)     Readmission Risk Interventions     No data to display

## 2023-12-19 NOTE — Discharge Summary (Addendum)
 Physician Discharge Summary  [name] ZOX:[id number] DOB: 1940/09/30 DOA: 12/18/2023  PCP: Leah Hove, MD  Admit date: 12/18/2023 Discharge date: 12/19/2023  Admitted From: home  Disposition:  home   Recommendations for Outpatient Follow-up:  Follow up with PCP in 1-2 weeks   Home Health: no  Equipment/Devices:  Discharge Condition: stable  CODE STATUS: full  Diet recommendation: Heart Healthy  Brief/Interim Summary: HPI was taken from Dr. Guss [name]: [name] is a 83 y.o. female with medical history significant of Hypertension, B12 deficiency, depression, mild persistent asthma, erosive gastropathy, nonobstructive CAD, who presents to the ED due to vomiting and concern for skin infection.   [name] states that [place] beach [place], she when she began to develop subjective hot and cold chills, body aches, headache, nausea, vomiting, poor appetite.  Shortly after the symptoms started, she noticed a rash on her left lower extremity.  She notes a prior history of cellulitis and notes and looks similar to this.  [place] her on Bactrim .  She completed the course yesterday.  Her leg redness seems much improved, however she is still not feeling well.  She followed up with her normal PCP today and began vomiting at their office.  Due to this, she was sent to the ED.   She denies any chest pain, shortness of breath but endorses a cough.  She denies any abdominal pain, dysuria, urinary urgency/frequency but notes she has been urinating less over the past couple days.  She denies any diarrhea.   ED course: On arrival to the ED, patient was normotensive at 120/66 with heart rate of 64.  She was saturating at 100% on room air.  She was afebrile at 98.2.  Initial workup notable for unremarkable CBC, BUN 25, creatinine 1.4, GFR 37, and lactic acid 2.8.  COVID-19, influenza and RSV PCR negative.  Urinalysis with small leukocytes, and  increased specific gravity.  COVID #19, influenza and RSV PCR negative.  Left lower extremity Doppler negative for DVT.  Patient started on IV fluids, Rocephin , vancomycin , Protonix .  [facility] contacted for admission.  Discharge Diagnoses:  Principal Problem:   AKI (acute kidney injury) (HCC) Active Problems:   Nausea and vomiting   Cellulitis   Essential hypertension, benign  AKI: continue on IVFs. Cr is trending down. Possibly secondary to vomiting & recent bactrim  use.   Nausea and vomiting: etiology unclear, possible virus. Zofran  prn. Continue w/ supportive care. Pt denies vomiting today. Much improved     Cellulitis: of LLE. Completed a course of bactrim  recently. Will hold further abxs for now and monitor   Obesity: BMI 30.2. Would benefit from weight loss   Discharge Instructions  Discharge Instructions     Ambulatory referral to Physical Therapy   Complete by: As directed    Diet - low sodium heart healthy   Complete by: As directed    Diet general   Complete by: As directed    Discharge instructions   Complete by: As directed    F/u w/ PCP in 1-2 weeks   Increase activity slowly   Complete by: As directed    Increase activity slowly   Complete by: As directed       Allergies as of 12/19/2023       Reactions   Epinephrine  Other (See Comments), Anaphylaxis   Heart palpitations   Other Anaphylaxis   Black pepper- caused bronchial tubes  to start closing    Penicillins Swelling, Other (See Comments)   Tolerated rocephin  within past year Lip swelling Has patient had a PCN reaction causing immediate rash, facial/tongue/throat swelling, SOB or lightheadedness with hypotension: Yes Has patient had a PCN reaction causing severe rash involving mucus membranes or skin necrosis: No Has patient had a PCN reaction that required hospitalization No Has patient had a PCN reaction occurring within the last 10 years: No If all of the above answers are "NO", then may proceed with  Cephalosporin use.   Valium [diazepam] Other (See Comments)   Other reaction(s): Other (See Comments) Overly sensitive  sedation        Medication List     STOP taking these medications    clindamycin  150 MG capsule Commonly known as: CLEOCIN    POTASSIMIN PO   predniSONE  10 MG tablet Commonly known as: DELTASONE    sulfamethoxazole -trimethoprim  800-160 MG tablet Commonly known as: BACTRIM  DS       TAKE these medications    albuterol  108 (90 Base) MCG/ACT inhaler Commonly known as: ProAir  HFA Inhale 1-2 puffs into the lungs every 6 (six) hours as needed for wheezing or shortness of breath.   aspirin  81 MG tablet TAKE 1 TABLET BY MOUTH EVERY DAY   atorvastatin  20 MG tablet Commonly known as: LIPITOR Take 1 tablet (20 mg total) by mouth daily.   Biotin 1000 MCG tablet Take 1,000 mcg by mouth daily.   Breo Ellipta  100-25 MCG/ACT Aepb Generic drug: fluticasone furoate-vilanterol Inhale 1 puff into the lungs daily. Inhale 1 puff by mouth once daily   Calcium  600-200 MG-UNIT tablet Take 1 tablet daily by mouth.   cholestyramine  4 g packet Commonly known as: QUESTRAN  Take 4 g by mouth 2 (two) times daily as needed.   cyanocobalamin  1000 MCG tablet Commonly known as: VITAMIN B12 Take 2,000 mcg by mouth daily. 2 tabs   DULoxetine  60 MG capsule Commonly known as: Cymbalta  Take 1 capsule (60 mg total) by mouth daily.   Fish Oil 1000 MG Caps Take 1,000 mg by mouth daily.   FOLIC ACID  PO Take by mouth daily.   levothyroxine  75 MCG tablet Commonly known as: Synthroid  Take 1 tablet (75 mcg total) by mouth daily before breakfast.   Magnesium  250 MG Tabs Take by mouth.   olmesartan  5 MG tablet Commonly known as: BENICAR  Take 2 tablets (10 mg total) by mouth 2 (two) times daily.   pantoprazole  40 MG tablet Commonly known as: PROTONIX  Take 40 mg by mouth daily.   Vitamin D3 50 MCG (2000 UT) capsule Take 2,000 Units by mouth daily.   Voquezna  10 MG  Tabs Generic drug: Vonoprazan Fumarate  Take 10 mg by mouth daily.        Allergies  Allergen Reactions   Epinephrine  Other (See Comments) and Anaphylaxis    Heart palpitations   Other Anaphylaxis    Black pepper- caused bronchial tubes to start closing    Penicillins Swelling and Other (See Comments)    Tolerated rocephin  within past year Lip swelling Has patient had a PCN reaction causing immediate rash, facial/tongue/throat swelling, SOB or lightheadedness with hypotension: Yes Has patient had a PCN reaction causing severe rash involving mucus membranes or skin necrosis: No Has patient had a PCN reaction that required hospitalization No Has patient had a PCN reaction occurring within the last 10 years: No If all of the above answers are "NO", then may proceed with Cephalosporin use.   Valium [Diazepam] Other (See Comments)  Other reaction(s): Other (See Comments) Overly sensitive  sedation    Consultations:   Procedures/Studies: US  Venous Img Lower Unilateral Left Result Date: 12/18/2023 CLINICAL DATA:  Left lower extremity pain, swelling and cellulitis EXAM: LEFT LOWER EXTREMITY VENOUS DOPPLER ULTRASOUND TECHNIQUE: Gray-scale sonography with compression, as well as color and duplex ultrasound, were performed to evaluate the deep venous system(s) from the level of the common femoral vein through the popliteal and proximal calf veins. COMPARISON:  None Available. FINDINGS: VENOUS Normal compressibility of the common femoral, superficial femoral, and popliteal veins, as well as the visualized calf veins. Visualized portions of profunda femoral vein and great saphenous vein unremarkable. No filling defects to suggest DVT on grayscale or color Doppler imaging. Doppler waveforms show normal direction of venous flow, normal respiratory plasticity and response to augmentation. Limited views of the contralateral common femoral vein are unremarkable. OTHER None. Limitations: none  IMPRESSION: Negative. Electronically Signed   By: Fernando Hoyer M.D.   On: 12/18/2023 13:23   DG Foot Complete Right Result Date: 11/29/2023 Please see detailed radiograph report in office note.  (Echo, Carotid, EGD, Colonoscopy, ERCP)    Subjective: Pt c/o fatigue    Discharge Exam: Vitals:   12/19/23 0350 12/19/23 0718  BP: (!) 115/50 134/62  Pulse: 66 62  Resp: 20 19  Temp: 98.1 F (36.7 C) 97.7 F (36.5 C)  SpO2: 100% 95%   Vitals:   12/18/23 1408 12/18/23 1955 12/19/23 0350 12/19/23 0718  BP: 123/67 (!) 145/74 (!) 115/50 134/62  Pulse: 69 69 66 62  Resp: 16 20 20 19   Temp: 97.7 F (36.5 C) 98.4 F (36.9 C) 98.1 F (36.7 C) 97.7 F (36.5 C)  TempSrc: Oral Oral Oral Oral  SpO2: 99% 97% 100% 95%  Weight:      Height:        General: Pt is alert, awake, not in acute distress Cardiovascular:S1/S2 +, no rubs, no gallops Respiratory: CTA bilaterally, no wheezing, no rhonchi Abdominal: Soft, NT, obese, bowel sounds + Extremities: no edema, no cyanosis    The results of significant diagnostics from this hospitalization (including imaging, microbiology, ancillary and laboratory) are listed below for reference.     Microbiology: Recent Results (from the past 240 hours)  Blood Culture (routine x 2)     Status: None (Preliminary result)   Collection Time: 12/18/23 10:33 AM   Specimen: BLOOD  Result Value Ref Range Status   Specimen Description BLOOD BLOOD RIGHT ARM  Final   Special Requests   Final    BOTTLES DRAWN AEROBIC AND ANAEROBIC Blood Culture adequate volume   Culture   Final    NO GROWTH < 24 HOURS Performed at [facility], [address] [place], [place] [address]    Report Status PENDING  Incomplete  Blood Culture (routine x 2)     Status: None (Preliminary result)   Collection Time: 12/18/23 12:07 PM   Specimen: BLOOD  Result Value Ref Range Status   Specimen Description BLOOD BLOOD LEFT HAND  Final   Special Requests   Final     BOTTLES DRAWN AEROBIC AND ANAEROBIC Blood Culture adequate volume   Culture   Final    NO GROWTH < 24 HOURS Performed at [facility], [address] [place], [place] [address]    Report Status PENDING  Incomplete  Resp panel by RT-PCR (RSV, Flu A&B, Covid) Anterior Nasal Swab     Status: None   Collection Time: 12/18/23  1:03 PM  Specimen: Anterior Nasal Swab  Result Value Ref Range Status   SARS Coronavirus 2 by RT PCR NEGATIVE NEGATIVE Final    Comment: (NOTE) SARS-CoV-2 target nucleic acids are NOT DETECTED.  The SARS-CoV-2 RNA is generally detectable in upper respiratory specimens during the acute phase of infection. The lowest concentration of SARS-CoV-2 viral copies this assay can detect is 138 copies/mL. A negative result does not preclude SARS-Cov-2 infection and should not be used as the sole basis for treatment or other patient management decisions. A negative result may occur with  improper specimen collection/handling, submission of specimen other than nasopharyngeal swab, presence of viral mutation(s) within the areas targeted by this assay, and inadequate number of viral copies(<138 copies/mL). A negative result must be combined with clinical observations, patient history, and epidemiological information. The expected result is Negative.  Fact Sheet for Patients:  BloggerCourse.com  Fact Sheet for Healthcare Providers:  SeriousBroker.it  This test is no t yet approved or cleared by the [place]  FDA and  has been authorized for detection and/or diagnosis of SARS-CoV-2 by FDA under an Emergency Use Authorization (EUA). This EUA will remain  in effect (meaning this test can be used) for the duration of the COVID-19 declaration under Section 564(b)(1) of the Act, 21 U.S.C.section 360bbb-3(b)(1), unless the authorization is terminated  or revoked sooner.       Influenza A by PCR NEGATIVE NEGATIVE  Final   Influenza B by PCR NEGATIVE NEGATIVE Final    Comment: (NOTE) The Xpert Xpress SARS-CoV-2/FLU/RSV plus assay is intended as an aid in the diagnosis of influenza from Nasopharyngeal swab specimens and should not be used as a sole basis for treatment. Nasal washings and aspirates are unacceptable for Xpert Xpress SARS-CoV-2/FLU/RSV testing.  Fact Sheet for Patients: BloggerCourse.com  Fact Sheet for Healthcare Providers: SeriousBroker.it  This test is not yet approved or cleared by the [place]  FDA and has been authorized for detection and/or diagnosis of SARS-CoV-2 by FDA under an Emergency Use Authorization (EUA). This EUA will remain in effect (meaning this test can be used) for the duration of the COVID-19 declaration under Section 564(b)(1) of the Act, 21 U.S.C. section 360bbb-3(b)(1), unless the authorization is terminated or revoked.     Resp Syncytial Virus by PCR NEGATIVE NEGATIVE Final    Comment: (NOTE) Fact Sheet for Patients: BloggerCourse.com  Fact Sheet for Healthcare Providers: SeriousBroker.it  This test is not yet approved or cleared by the [place]  FDA and has been authorized for detection and/or diagnosis of SARS-CoV-2 by FDA under an Emergency Use Authorization (EUA). This EUA will remain in effect (meaning this test can be used) for the duration of the COVID-19 declaration under Section 564(b)(1) of the Act, 21 U.S.C. section 360bbb-3(b)(1), unless the authorization is terminated or revoked.  Performed at [facility], [address] [place], [place] [address]      Labs: BNP (last 3 results) No results for input(s): "BNP" in the last 8760 hours. Basic Metabolic Panel: Recent Labs  Lab 12/18/23 0937 12/19/23 0307  NA 137 137  K 4.6 4.9  CL 106 109  CO2 20* 22  GLUCOSE 128* 111*  BUN 25* 27*  CREATININE 1.41* 1.19*   CALCIUM  9.2 8.5*   Liver Function Tests: Recent Labs  Lab 12/18/23 0937  AST 21  ALT 20  ALKPHOS 71  BILITOT 1.5*  PROT 7.1  ALBUMIN 4.0   No results for input(s): "LIPASE", "AMYLASE" in the last 168 hours. No results for input(s): "  AMMONIA" in the last 168 hours. CBC: Recent Labs  Lab 12/18/23 0937  WBC 5.7  HGB 13.3  HCT 40.3  MCV 88.0  PLT 242   Cardiac Enzymes: No results for input(s): "CKTOTAL", "CKMB", "CKMBINDEX", "TROPONINI" in the last 168 hours. BNP: Invalid input(s): "POCBNP" CBG: No results for input(s): "GLUCAP" in the last 168 hours. D-Dimer No results for input(s): "DDIMER" in the last 72 hours. Hgb A1c No results for input(s): "HGBA1C" in the last 72 hours. Lipid Profile No results for input(s): "CHOL", "HDL", "LDLCALC", "TRIG", "CHOLHDL", "LDLDIRECT" in the last 72 hours. Thyroid  function studies No results for input(s): "TSH", "T4TOTAL", "T3FREE", "THYROIDAB" in the last 72 hours.  Invalid input(s): "FREET3" Anemia work up No results for input(s): "VITAMINB12", "FOLATE", "FERRITIN", "TIBC", "IRON", "RETICCTPCT" in the last 72 hours. Urinalysis    Component Value Date/Time   COLORURINE YELLOW 12/18/2023 1219   APPEARANCEUR CLEAR 12/18/2023 1219   LABSPEC >1.030 (H) 12/18/2023 1219   PHURINE 5.5 12/18/2023 1219   GLUCOSEU NEGATIVE 12/18/2023 1219   GLUCOSEU NEGATIVE 08/18/2022 0923   HGBUR NEGATIVE 12/18/2023 1219   BILIRUBINUR SMALL (A) 12/18/2023 1219   BILIRUBINUR Negative 11/07/2023 1156   KETONESUR NEGATIVE 12/18/2023 1219   PROTEINUR NEGATIVE 12/18/2023 1219   UROBILINOGEN 0.2 11/07/2023 1156   UROBILINOGEN 0.2 08/18/2022 0923   NITRITE NEGATIVE 12/18/2023 1219   LEUKOCYTESUR SMALL (A) 12/18/2023 1219   Sepsis Labs Recent Labs  Lab 12/18/23 0937  WBC 5.7   Microbiology Recent Results (from the past 240 hours)  Blood Culture (routine x 2)     Status: None (Preliminary result)   Collection Time: 12/18/23 10:33 AM    Specimen: BLOOD  Result Value Ref Range Status   Specimen Description BLOOD BLOOD RIGHT ARM  Final   Special Requests   Final    BOTTLES DRAWN AEROBIC AND ANAEROBIC Blood Culture adequate volume   Culture   Final    NO GROWTH < 24 HOURS Performed at [facility], [address], [place], [place] [address]    Report Status PENDING  Incomplete  Blood Culture (routine x 2)     Status: None (Preliminary result)   Collection Time: 12/18/23 12:07 PM   Specimen: BLOOD  Result Value Ref Range Status   Specimen Description BLOOD BLOOD LEFT HAND  Final   Special Requests   Final    BOTTLES DRAWN AEROBIC AND ANAEROBIC Blood Culture adequate volume   Culture   Final    NO GROWTH < 24 HOURS Performed at [facility], [address], [place], [place] [address]    Report Status PENDING  Incomplete  Resp panel by RT-PCR (RSV, Flu A&B, Covid) Anterior Nasal Swab     Status: None   Collection Time: 12/18/23  1:03 PM   Specimen: Anterior Nasal Swab  Result Value Ref Range Status   SARS Coronavirus 2 by RT PCR NEGATIVE NEGATIVE Final    Comment: (NOTE) SARS-CoV-2 target nucleic acids are NOT DETECTED.  The SARS-CoV-2 RNA is generally detectable in upper respiratory specimens during the acute phase of infection. The lowest concentration of SARS-CoV-2 viral copies this assay can detect is 138 copies/mL. A negative result does not preclude SARS-Cov-2 infection and should not be used as the sole basis for treatment or other patient management decisions. A negative result may occur with  improper specimen collection/handling, submission of specimen other than nasopharyngeal swab, presence of viral mutation(s) within the areas targeted by this assay, and inadequate number of viral copies(<138  copies/mL). A negative result must be combined with clinical observations, patient history, and epidemiological information. The expected result is Negative.  Fact Sheet for Patients:   BloggerCourse.com  Fact Sheet for Healthcare Providers:  SeriousBroker.it  This test is no t yet approved or cleared by the [place]  FDA and  has been authorized for detection and/or diagnosis of SARS-CoV-2 by FDA under an Emergency Use Authorization (EUA). This EUA will remain  in effect (meaning this test can be used) for the duration of the COVID-19 declaration under Section 564(b)(1) of the Act, 21 U.S.C.section 360bbb-3(b)(1), unless the authorization is terminated  or revoked sooner.       Influenza A by PCR NEGATIVE NEGATIVE Final   Influenza B by PCR NEGATIVE NEGATIVE Final    Comment: (NOTE) The Xpert Xpress SARS-CoV-2/FLU/RSV plus assay is intended as an aid in the diagnosis of influenza from Nasopharyngeal swab specimens and should not be used as a sole basis for treatment. Nasal washings and aspirates are unacceptable for Xpert Xpress SARS-CoV-2/FLU/RSV testing.  Fact Sheet for Patients: BloggerCourse.com  Fact Sheet for Healthcare Providers: SeriousBroker.it  This test is not yet approved or cleared by the [place]  FDA and has been authorized for detection and/or diagnosis of SARS-CoV-2 by FDA under an Emergency Use Authorization (EUA). This EUA will remain in effect (meaning this test can be used) for the duration of the COVID-19 declaration under Section 564(b)(1) of the Act, 21 U.S.C. section 360bbb-3(b)(1), unless the authorization is terminated or revoked.     Resp Syncytial Virus by PCR NEGATIVE NEGATIVE Final    Comment: (NOTE) Fact Sheet for Patients: BloggerCourse.com  Fact Sheet for Healthcare Providers: SeriousBroker.it  This test is not yet approved or cleared by the [place]  FDA and has been authorized for detection and/or diagnosis of SARS-CoV-2 by FDA under an Emergency Use  Authorization (EUA). This EUA will remain in effect (meaning this test can be used) for the duration of the COVID-19 declaration under Section 564(b)(1) of the Act, 21 U.S.C. section 360bbb-3(b)(1), unless the authorization is terminated or revoked.  Performed at [facility], [address] [place], [place] [address]      Time coordinating discharge: Over 30 minutes  SIGNED:   Alphonsus Jeans, MD  [facility] 12/19/2023, 1:05 PM Pager   If 7PM-7AM, please contact night-coverage [email or web address]

## 2023-12-23 LAB — CULTURE, BLOOD (ROUTINE X 2)
Culture: NO GROWTH
Culture: NO GROWTH
Special Requests: ADEQUATE
Special Requests: ADEQUATE

## 2023-12-25 ENCOUNTER — Ambulatory Visit (INDEPENDENT_AMBULATORY_CARE_PROVIDER_SITE_OTHER): Admitting: Internal Medicine

## 2023-12-25 ENCOUNTER — Ambulatory Visit: Payer: Self-pay | Admitting: Internal Medicine

## 2023-12-25 ENCOUNTER — Encounter: Payer: Self-pay | Admitting: Internal Medicine

## 2023-12-25 VITALS — BP 96/62 | HR 103 | Ht 66.0 in | Wt 180.6 lb

## 2023-12-25 DIAGNOSIS — I10 Essential (primary) hypertension: Secondary | ICD-10-CM

## 2023-12-25 DIAGNOSIS — F411 Generalized anxiety disorder: Secondary | ICD-10-CM | POA: Diagnosis not present

## 2023-12-25 DIAGNOSIS — E782 Mixed hyperlipidemia: Secondary | ICD-10-CM | POA: Diagnosis not present

## 2023-12-25 DIAGNOSIS — R300 Dysuria: Secondary | ICD-10-CM

## 2023-12-25 DIAGNOSIS — L03116 Cellulitis of left lower limb: Secondary | ICD-10-CM | POA: Diagnosis not present

## 2023-12-25 MED ORDER — DOXYCYCLINE HYCLATE 100 MG PO TABS: 100.0000 mg | ORAL_TABLET | Freq: Two times a day (BID) | ORAL | 0 refills | Status: DC

## 2023-12-25 NOTE — Progress Notes (Signed)
 Established Patient Office Visit  Subjective:  Patient ID: [name], female    DOB: 1940-12-30  Age: 83 y.o. MRN: [id number]  Chief Complaint  Patient presents with   Hospitalization Follow-up    Patient comes in for hospital follow-up.  She was recently evaluated for nausea vomiting and cellulitis of her left lower leg.  This was initially treated with Bactrim , and patient was given IV hydration and sent home.  However today her blood pressure is still on the low side.  But she had taken her morning dose of olmesartan , she takes 10 mg twice a day.  Her leg swelling has gone down but still has mild redness, and is slightly tender to touch.  Her venous Dopplers were negative for DVT.  Will give another 1 weeks of doxycycline .  Patient advised to monitor her blood pressure at home.  She needs to hold her nighttime dose of olmesartan , and only take her morning dose if her systolic blood pressure is above 130.  Patient also encouraged to continue rest and fluids at home.    No other concerns at this time.   Past Medical History:  Diagnosis Date   Acid reflux    Allergy    Anemia    Anxiety    in past   Arthritis    all over   Asthma    Atypical moles    Bronchitis    Bronchospasm    reactive with perfume/cologne and smoke   CAD (coronary artery disease)    patient denies on preop of 10/24    Candida esophagitis (HCC)    Carotid artery calcification, bilateral 04/2016   Carpal tunnel syndrome of left wrist 12/03/2019   Cataract    Cervical stenosis of spine    Chronic insomnia    Complication of anesthesia    shoulder surgery- not all the way asleep   COVID-19 02/04/2021   also 10/3 or 04/20/21 sxs started 04/05/21   Depression    in past   Diverticulitis    Diverticulosis    Dizziness    Elevated liver enzymes    Family history of adverse reaction to anesthesia    sister- has problems with nausea and vomiting    Fatigue    Fatty liver    Generalized headache     migraines   GERD (gastroesophageal reflux disease)    Gross hematuria 08/18/2022   Hereditary and idiopathic peripheral neuropathy 05/11/2015   History of chemotherapy    topical cream for h/o skin CA   History of kidney stones    History of neuropathy    FH Dr. Lydia Sams neurology   History of vertigo    Hx of migraines    Hyperlipidemia    Hypothyroidism    Insomnia    Irritable bowel syndrome    LLQ abdominal pain    Melanoma (HCC) 2013   Melanoma (HCC)    x2 stg II melanoma   Neuropathy, peripheral    Peripheral neuropathy    Pneumonia    hx of x 2   PONV (postoperative nausea and vomiting)    Pre-diabetes    Retina disorder    left   SCC (squamous cell carcinoma)    skin    Skin cancer 07/17/2019   SCC left leg s/p mohs   SCC 05/01/18 left medial mid leg, right post mid leg 06/08/18, left mid well 04/10/19  Dr. Joanne Muckle derm, Dr. Eva Hikes mohs       Stroke (  HCC)    ? TIA per Dr V per patient    Thyroid  disease    Urine incontinence    Viral URI with cough 06/09/2022   Vitamin D  deficiency    Weight gain     Past Surgical History:  Procedure Laterality Date   ABLATION Bilateral    Dr. Vonna Guardian 12/2020 veins laser ablation of both great saphenous veins in a staged fashion.   BILATERAL SALPINGOOPHORECTOMY     ovarian cyst Dr. Alva Jewels and Dr. Willy Harvest 2008   BREAST SURGERY     in 2014 bx breast calcifications   BREAST SURGERY     augmentation with saline Dr. Ceclia Cohens 1995    CHOLECYSTECTOMY N/A 01/28/2015   Procedure: LAPAROSCOPIC CHOLECYSTECTOMY WITH INTRAOPERATIVE CHOLANGIOGRAM;  Surgeon: Lillette Reid III, MD;  Location: [facility] OR;  Service: General;  Laterality: N/A;   CLOSED MANIPULATION SHOULDER     rt   COLONOSCOPY WITH PROPOFOL  N/A 11/20/2012   Procedure: COLONOSCOPY WITH PROPOFOL ;  Surgeon: Garrett Kallman, MD;  Location: [facility];  Service: Endoscopy;  Laterality: N/A;   colonscopy     May 2014   FOOT BONE EXCISION     JOINT REPLACEMENT     RTK   KNEE  ARTHROSCOPY     left knee miniscus tear repair     MELANOMA EXCISION WITH SENTINEL LYMPH NODE BIOPSY  03/18/2011   Back, nodes neg   OOPHORECTOMY     ROTATOR CUFF REPAIR     left   SHOULDER SURGERY     right and left    SKIN CANCER EXCISION     multiple   TOTAL HIP ARTHROPLASTY     TOTAL KNEE ARTHROPLASTY     right 2010 and left in 2018    TOTAL KNEE ARTHROPLASTY Left 05/16/2017   Procedure: LEFT TOTAL KNEE ARTHROPLASTY;  Surgeon: Claiborne Crew, MD;  Location: [facility];  Service: Orthopedics;  Laterality: Left;  70 mins   TUBAL LIGATION     TUMOR REMOVAL     ovary    Social History   Socioeconomic History   Marital status: Divorced    Spouse name: Not on file   Number of children: 3   Years of education: College   Highest education level: Some college, no degree  Occupational History   Occupation: Retired    Associate Professor: RETIRED  Tobacco Use   Smoking status: Never   Smokeless tobacco: Never  Vaping Use   Vaping status: Never Used  Substance and Sexual Activity   Alcohol use: Yes    Alcohol/week: 1.0 standard drink of alcohol    Types: 1 Cans of beer per week    Comment: mixed drink about two times a year   Drug use: No   Sexual activity: Not Currently  Other Topics Concern   Not on file  Social History Narrative   Patient lives at home alone in a one story home.   Has 3 children (2 daughters and 1 son)    Retired from [employer].   Divorced since 1992    Caffeine Use: 1 cup daily and a soft drink occasionally    No guns    Wears seat belt     Lives with cat    Left handed   Social Drivers of Health   Financial Resource Strain: Low Risk  (04/18/2023)   Overall Financial Resource Strain (CARDIA)    Difficulty of Paying Living Expenses: Not hard at all  Food Insecurity: No Food  Insecurity (12/18/2023)   Hunger Vital Sign    Worried About Running Out of Food in the Last Year: Never true    Ran Out of Food in the Last Year: Never true   Transportation Needs: No Transportation Needs (12/18/2023)   PRAPARE - Administrator, Civil Service (Medical): No    Lack of Transportation (Non-Medical): No  Physical Activity: Inactive (04/18/2023)   Exercise Vital Sign    Days of Exercise per Week: 0 days    Minutes of Exercise per Session: 0 min  Stress: Stress Concern Present (04/18/2023)   [employer] of Occupational Health - Occupational Stress Questionnaire    Feeling of Stress : To some extent  Social Connections: Moderately Integrated (12/18/2023)   Social Connection and Isolation Panel [NHANES]    Frequency of Communication with Friends and Family: More than three times a week    Frequency of Social Gatherings with Friends and Family: Three times a week    Attends Religious Services: More than 4 times per year    Active Member of Clubs or Organizations: Yes    Attends Banker Meetings: More than 4 times per year    Marital Status: Divorced  Intimate Partner Violence: Not At Risk (12/18/2023)   Humiliation, Afraid, Rape, and Kick questionnaire    Fear of Current or Ex-Partner: No    Emotionally Abused: No    Physically Abused: No    Sexually Abused: No    Family History  Problem Relation Age of Onset   Hypertension Mother    Stroke Mother    Hyperlipidemia Mother    Heart disease Father    Diabetes Father    Hypertension Father    Hyperlipidemia Father    Early death Father    Diabetes Sister    Heart disease Sister        heart murmur   Arthritis Sister    COPD Sister    Hyperlipidemia Sister    Hypertension Sister    Colon polyps Sister        adenomous   Arthritis Sister    Heart disease Sister    Hyperlipidemia Sister    Hypertension Sister    Cancer Sister    Dementia Sister    Tremor Sister    Stroke Maternal Grandmother    Colon cancer Maternal Grandfather    Cancer Maternal Grandfather    Early death Maternal Grandfather    Early death Paternal Grandmother    Stroke  Paternal Grandmother    Heart disease Daughter    Hyperlipidemia Daughter    Hypertension Daughter    Heart disease Son    Hyperlipidemia Son    Alcohol abuse Son    Depression Son    Diabetes Son    Pancreatic cancer Maternal Uncle    Alzheimer's disease Paternal Aunt    Alzheimer's disease Paternal Aunt    Alzheimer's disease Paternal Aunt    Alzheimer's disease Paternal Aunt    Cancer Other        2 paternal uncles and 1 maternal uncle colon cancer   Esophageal cancer Neg Hx    Stomach cancer Neg Hx     Allergies  Allergen Reactions   Epinephrine  Other (See Comments) and Anaphylaxis    Heart palpitations   Other Anaphylaxis    Black pepper- caused bronchial tubes to start closing    Penicillins Swelling and Other (See Comments)    Tolerated rocephin  within past year Lip swelling Has patient  had a PCN reaction causing immediate rash, facial/tongue/throat swelling, SOB or lightheadedness with hypotension: Yes Has patient had a PCN reaction causing severe rash involving mucus membranes or skin necrosis: No Has patient had a PCN reaction that required hospitalization No Has patient had a PCN reaction occurring within the last 10 years: No If all of the above answers are "NO", then may proceed with Cephalosporin use.   Valium [Diazepam] Other (See Comments)    Other reaction(s): Other (See Comments) Overly sensitive  sedation    Outpatient Medications Prior to Visit  Medication Sig   albuterol  (PROAIR  HFA) 108 (90 Base) MCG/ACT inhaler Inhale 1-2 puffs into the lungs every 6 (six) hours as needed for wheezing or shortness of breath.   aspirin  81 MG tablet TAKE 1 TABLET BY MOUTH EVERY DAY   atorvastatin  (LIPITOR) 20 MG tablet Take 1 tablet (20 mg total) by mouth daily.   Biotin 1000 MCG tablet Take 1,000 mcg by mouth daily.   BREO ELLIPTA  100-25 MCG/ACT AEPB Inhale 1 puff into the lungs daily. Inhale 1 puff by mouth once daily   Calcium  600-200 MG-UNIT tablet Take 1 tablet  daily by mouth.   Cholecalciferol  (VITAMIN D3) 2000 UNITS capsule Take 2,000 Units by mouth daily.   cholestyramine  (QUESTRAN ) 4 g packet Take 4 g by mouth 2 (two) times daily as needed.   DULoxetine  (CYMBALTA ) 60 MG capsule Take 1 capsule (60 mg total) by mouth daily.   FOLIC ACID  PO Take by mouth daily.   levothyroxine  (SYNTHROID ) 75 MCG tablet Take 1 tablet (75 mcg total) by mouth daily before breakfast.   Magnesium  250 MG TABS Take by mouth.   olmesartan  (BENICAR ) 5 MG tablet Take 2 tablets (10 mg total) by mouth 2 (two) times daily.   Omega-3 Fatty Acids (FISH OIL) 1000 MG CAPS Take 1,000 mg by mouth daily.    vitamin B-12 (CYANOCOBALAMIN ) 1000 MCG tablet Take 2,000 mcg by mouth daily. 2 tabs   Vonoprazan Fumarate  (VOQUEZNA ) 10 MG TABS Take 10 mg by mouth daily.   [DISCONTINUED] pantoprazole  (PROTONIX ) 40 MG tablet Take 40 mg by mouth daily.   No facility-administered medications prior to visit.    Review of Systems  Constitutional: Negative.  Negative for chills, fever, malaise/fatigue and weight loss.  HENT: Negative.  Negative for ear discharge and sore throat.   Eyes: Negative.   Respiratory: Negative.  Negative for cough and shortness of breath.   Cardiovascular: Negative.  Negative for chest pain, palpitations and leg swelling.  Gastrointestinal: Negative.  Negative for abdominal pain, constipation, diarrhea, heartburn, nausea and vomiting.  Genitourinary: Negative.  Negative for dysuria and flank pain.  Musculoskeletal: Negative.  Negative for joint pain and myalgias.  Skin: Negative.   Neurological: Negative.  Negative for dizziness, tingling, tremors, sensory change and headaches.  Endo/Heme/Allergies: Negative.   Psychiatric/Behavioral: Negative.  Negative for depression and suicidal ideas. The patient is not nervous/anxious.        Objective:   BP 96/62   Pulse (!) 103   Ht 5\' 6"  (1.676 m)   Wt 180 lb 9.6 oz (81.9 kg)   SpO2 91%   BMI 29.15 kg/m   Vitals:    12/25/23 1309  BP: 96/62  Pulse: (!) 103  Height: 5\' 6"  (1.676 m)  Weight: 180 lb 9.6 oz (81.9 kg)  SpO2: 91%  BMI (Calculated): 29.16    Physical Exam Vitals and nursing note reviewed.  Constitutional:      Appearance: Normal appearance.  HENT:     Head: Normocephalic and atraumatic.     Nose: Nose normal.     Mouth/Throat:     Mouth: Mucous membranes are moist.     Pharynx: Oropharynx is clear.  Eyes:     Conjunctiva/sclera: Conjunctivae normal.     Pupils: Pupils are equal, round, and reactive to light.  Cardiovascular:     Rate and Rhythm: Normal rate and regular rhythm.     Pulses: Normal pulses.     Heart sounds: Normal heart sounds. No murmur heard. Pulmonary:     Effort: Pulmonary effort is normal.     Breath sounds: Normal breath sounds. No wheezing.  Abdominal:     General: Bowel sounds are normal.     Palpations: Abdomen is soft.     Tenderness: There is no abdominal tenderness. There is no right CVA tenderness or left CVA tenderness.  Musculoskeletal:        General: Normal range of motion.     Cervical back: Normal range of motion.     Right lower leg: No edema.     Left lower leg: No edema.  Skin:    General: Skin is warm and dry.  Neurological:     General: No focal deficit present.     Mental Status: She is alert and oriented to person, place, and time.  Psychiatric:        Mood and Affect: Mood normal.        Behavior: Behavior normal.      No results found for any visits on 12/25/23.  Recent Results (from the past 2160 hours)  Surgical pathology ([facility])     Status: None   Collection Time: 10/05/23 12:00 AM  Result Value Ref Range   SURGICAL PATHOLOGY      SURGICAL PATHOLOGY [facility] [address] [place], [place] [address] Telephone [phone] or [phone] Fax [phone]  REPORT OF SURGICAL PATHOLOGY   Accession #: [id number] Patient Name: CURTISTINE, PETTITT Visit # :  [id number]  MRN: [id number] Physician: Alvester Johnson DOB/Age [name] 22, 1941 (Age: 45) Gender: F Collected Date: 10/05/2023 Received Date: 10/06/2023  FINAL DIAGNOSIS       1. Surgical [P], gastric :       - MILD REACTIVE GASTROPATHY.      - NEGATIVE FOR H. PYLORI ON H&E STAIN      - NO INTESTINAL METAPLASIA, DYSPLASIA, OR MALIGNANCY.       DATE SIGNED OUT: 10/09/2023 ELECTRONIC SIGNATURE Almeda Jacobs M.D., Nupur, Pathologist, Electronic Signature  MICROSCOPIC DESCRIPTION  CASE COMMENTS STAINS USED IN DIAGNOSIS: H&E    CLINICAL HISTORY  SPECIMEN(S) OBTAINED 1. Surgical [P], Gastric  SPECIMEN COMMENTS: 1. Gastroesophageal reflux disease with esophagitis and hemorrhage; mucosal abnormality of sto mach SPECIMEN CLINICAL INFORMATION: 1. R/O H. pylori    Gross Description 1. Received in formalin are tan, soft tissue fragments that are submitted in toto. Number: 2 Size: 0.4 cm, (1B) ( TA )        Report signed out from the following location(s) [place] [address], [place] [address] CLIA #: [id number]  [facility] [address] [place], [place] [address] CLIA #: [id number]   POCT Urinalysis Dipstick [phone])     Status: None   Collection Time: 10/30/23  1:58 PM  Result Value Ref Range   Color, UA Yellow    Clarity, UA Clear    Glucose, UA Negative Negative  Bilirubin, UA Negative    Ketones, UA Trace    Spec Grav, UA 1.015 1.010 - 1.025   Blood, UA Negative    pH, UA 5.5 5.0 - 8.0   Protein, UA Negative Negative   Urobilinogen, UA 0.2 0.2 or 1.0 E.U./dL   Nitrite, UA Negative    Leukocytes, UA Negative Negative   Appearance Clear    Odor No   POCT Urinalysis Dipstick (09811)     Status: Abnormal   Collection Time: 11/07/23 11:56 AM  Result Value Ref Range   Color, UA Orange    Clarity, UA Clear    Glucose, UA Negative Negative   Bilirubin, UA Negative    Ketones, UA Negative    Spec Grav, UA 1.015  1.010 - 1.025   Blood, UA Negative    pH, UA 5.5 5.0 - 8.0   Protein, UA Negative Negative   Urobilinogen, UA 0.2 0.2 or 1.0 E.U./dL   Nitrite, UA Negative    Leukocytes, UA Small (1+) (A) Negative   Appearance Clear    Odor Yes   CBC     Status: None   Collection Time: 12/18/23  9:37 AM  Result Value Ref Range   WBC 5.7 4.0 - 10.5 K/uL   RBC 4.58 3.87 - 5.11 MIL/uL   Hemoglobin 13.3 12.0 - 15.0 g/dL   HCT 91.4 78.2 - 95.6 %   MCV 88.0 80.0 - 100.0 fL   MCH 29.0 26.0 - 34.0 pg   MCHC 33.0 30.0 - 36.0 g/dL   RDW 21.3 08.6 - 57.8 %   Platelets 242 150 - 400 K/uL   nRBC 0.0 0.0 - 0.2 %    Comment: Performed at [facility], [address] [place], [place] [address]  Comprehensive metabolic panel     Status: Abnormal   Collection Time: 12/18/23  9:37 AM  Result Value Ref Range   Sodium 137 135 - 145 mmol/L   Potassium 4.6 3.5 - 5.1 mmol/L   Chloride 106 98 - 111 mmol/L   CO2 20 (L) 22 - 32 mmol/L   Glucose, Bld 128 (H) 70 - 99 mg/dL    Comment: Glucose reference range applies only to samples taken after fasting for at least 8 hours.   BUN 25 (H) 8 - 23 mg/dL   Creatinine, Ser 9.52 (H) 0.44 - 1.00 mg/dL   Calcium  9.2 8.9 - 10.3 mg/dL   Total Protein 7.1 6.5 - 8.1 g/dL   Albumin 4.0 3.5 - 5.0 g/dL   AST 21 15 - 41 U/L   ALT 20 0 - 44 U/L   Alkaline Phosphatase 71 38 - 126 U/L   Total Bilirubin 1.5 (H) 0.0 - 1.2 mg/dL   GFR, Estimated 37 (L) >60 mL/min    Comment: (NOTE) Calculated using the CKD-EPI Creatinine Equation (2021)    Anion gap 11 5 - 15    Comment: Performed at [facility], [address] [place], [place] [address]  Lactic acid, plasma     Status: Abnormal   Collection Time: 12/18/23  9:37 AM  Result Value Ref Range   Lactic Acid, Venous 2.8 (HH) 0.5 - 1.9 mmol/L    Comment: CRITICAL RESULT CALLED TO, READ BACK BY AND VERIFIED WITH ASHLEY GILMORE 12/18/23 1022 MW Performed at [facility], [address], [place], [place]  [address]   Blood Culture (routine x 2)     Status: None   Collection Time: 12/18/23 10:33 AM  Specimen: BLOOD  Result Value Ref Range   Specimen Description BLOOD BLOOD RIGHT ARM    Special Requests      BOTTLES DRAWN AEROBIC AND ANAEROBIC Blood Culture adequate volume   Culture      NO GROWTH 5 DAYS Performed at [facility], [address] [place], [place] [address]    Report Status 12/23/2023 FINAL   Lactic acid, plasma     Status: None   Collection Time: 12/18/23 12:07 PM  Result Value Ref Range   Lactic Acid, Venous 1.5 0.5 - 1.9 mmol/L    Comment: Performed at [facility], [address] [place], [place] [address]  Blood Culture (routine x 2)     Status: None   Collection Time: 12/18/23 12:07 PM   Specimen: BLOOD  Result Value Ref Range   Specimen Description BLOOD BLOOD LEFT HAND    Special Requests      BOTTLES DRAWN AEROBIC AND ANAEROBIC Blood Culture adequate volume   Culture      NO GROWTH 5 DAYS Performed at [facility], [address] [place], [place] [address]    Report Status 12/23/2023 FINAL   Urinalysis, w/ Reflex to Culture (Infection Suspected) -Urine, Clean Catch     Status: Abnormal   Collection Time: 12/18/23 12:19 PM  Result Value Ref Range   Specimen Source URINE, CLEAN CATCH    Color, Urine YELLOW YELLOW   APPearance CLEAR CLEAR   Specific Gravity, Urine >1.030 (H) 1.005 - 1.030   pH 5.5 5.0 - 8.0   Glucose, UA NEGATIVE NEGATIVE mg/dL   Hgb urine dipstick NEGATIVE NEGATIVE   Bilirubin Urine SMALL (A) NEGATIVE   Ketones, ur NEGATIVE NEGATIVE mg/dL   Protein, ur NEGATIVE NEGATIVE mg/dL   Nitrite NEGATIVE NEGATIVE   Leukocytes,Ua SMALL (A) NEGATIVE   Squamous Epithelial / HPF 0-5 0 - 5 /HPF   WBC, UA 0-5 0 - 5 WBC/hpf    Comment: Reflex urine culture not performed if WBC <=10, OR if Squamous epithelial cells >5. If Squamous epithelial cells >5, suggest recollection.   RBC / HPF 0-5 0 - 5 RBC/hpf   Bacteria, UA  NONE SEEN NONE SEEN    Comment: Performed at [facility], [address] [place], [place] [address]  Resp panel by RT-PCR (RSV, Flu A&B, Covid) Anterior Nasal Swab     Status: None   Collection Time: 12/18/23  1:03 PM   Specimen: Anterior Nasal Swab  Result Value Ref Range   SARS Coronavirus 2 by RT PCR NEGATIVE NEGATIVE    Comment: (NOTE) SARS-CoV-2 target nucleic acids are NOT DETECTED.  The SARS-CoV-2 RNA is generally detectable in upper respiratory specimens during the acute phase of infection. The lowest concentration of SARS-CoV-2 viral copies this assay can detect is 138 copies/mL. A negative result does not preclude SARS-Cov-2 infection and should not be used as the sole basis for treatment or other patient management decisions. A negative result may occur with  improper specimen collection/handling, submission of specimen other than nasopharyngeal swab, presence of viral mutation(s) within the areas targeted by this assay, and inadequate number of viral copies(<138 copies/mL). A negative result must be combined with clinical observations, patient history, and epidemiological information. The expected result is Negative.  Fact Sheet for Patients:  BloggerCourse.com  Fact Sheet for Healthcare Providers:  SeriousBroker.it  This test is no t yet approved or cleared by the [place]  FDA and  has been authorized for detection and/or diagnosis of  SARS-CoV-2 by FDA under an Emergency Use Authorization (EUA). This EUA will remain  in effect (meaning this test can be used) for the duration of the COVID-19 declaration under Section 564(b)(1) of the Act, 21 U.S.C.section 360bbb-3(b)(1), unless the authorization is terminated  or revoked sooner.       Influenza A by PCR NEGATIVE NEGATIVE   Influenza B by PCR NEGATIVE NEGATIVE    Comment: (NOTE) The Xpert Xpress SARS-CoV-2/FLU/RSV plus assay is intended as an  aid in the diagnosis of influenza from Nasopharyngeal swab specimens and should not be used as a sole basis for treatment. Nasal washings and aspirates are unacceptable for Xpert Xpress SARS-CoV-2/FLU/RSV testing.  Fact Sheet for Patients: BloggerCourse.com  Fact Sheet for Healthcare Providers: SeriousBroker.it  This test is not yet approved or cleared by the [place]  FDA and has been authorized for detection and/or diagnosis of SARS-CoV-2 by FDA under an Emergency Use Authorization (EUA). This EUA will remain in effect (meaning this test can be used) for the duration of the COVID-19 declaration under Section 564(b)(1) of the Act, 21 U.S.C. section 360bbb-3(b)(1), unless the authorization is terminated or revoked.     Resp Syncytial Virus by PCR NEGATIVE NEGATIVE    Comment: (NOTE) Fact Sheet for Patients: BloggerCourse.com  Fact Sheet for Healthcare Providers: SeriousBroker.it  This test is not yet approved or cleared by the [place]  FDA and has been authorized for detection and/or diagnosis of SARS-CoV-2 by FDA under an Emergency Use Authorization (EUA). This EUA will remain in effect (meaning this test can be used) for the duration of the COVID-19 declaration under Section 564(b)(1) of the Act, 21 U.S.C. section 360bbb-3(b)(1), unless the authorization is terminated or revoked.  Performed at [facility], [address] [place], [place] [address]   Basic metabolic panel     Status: Abnormal   Collection Time: 12/19/23  3:07 AM  Result Value Ref Range   Sodium 137 135 - 145 mmol/L   Potassium 4.9 3.5 - 5.1 mmol/L   Chloride 109 98 - 111 mmol/L   CO2 22 22 - 32 mmol/L   Glucose, Bld 111 (H) 70 - 99 mg/dL    Comment: Glucose reference range applies only to samples taken after fasting for at least 8 hours.   BUN 27 (H) 8 - 23 mg/dL   Creatinine, Ser  1.30 (H) 0.44 - 1.00 mg/dL   Calcium  8.5 (L) 8.9 - 10.3 mg/dL   GFR, Estimated 45 (L) >60 mL/min    Comment: (NOTE) Calculated using the CKD-EPI Creatinine Equation (2021)    Anion gap 6 5 - 15    Comment: Performed at [facility], [address], [place], [place] [address]      Assessment & Plan:  Doxycycline  for 1 week.  Monitor blood pressure and only take morning dose of olmesartan  if systolic blood pressure is above 130.  Continue rest and fluids. Problem List Items Addressed This Visit     Hyperlipidemia   Essential hypertension, benign   GAD (generalized anxiety disorder)   Cellulitis - Primary   Relevant Medications   doxycycline  (VIBRA -TABS) 100 MG tablet   Other Visit Diagnoses       Dysuria       Relevant Orders   POCT Urinalysis Dipstick (81002)       Follow up 1 week.  Total time spent: 30 minutes  Aisha Hove, MD  12/25/2023   This document may have been prepared by [employer] Voice Recognition software and  as such may include unintentional dictation errors.

## 2023-12-27 ENCOUNTER — Ambulatory Visit: Payer: PPO | Admitting: Pulmonary Disease

## 2024-01-01 ENCOUNTER — Ambulatory Visit (INDEPENDENT_AMBULATORY_CARE_PROVIDER_SITE_OTHER): Admitting: Internal Medicine

## 2024-01-01 ENCOUNTER — Encounter: Payer: Self-pay | Admitting: Internal Medicine

## 2024-01-01 VITALS — BP 110/70 | HR 73 | Ht 66.0 in | Wt 181.8 lb

## 2024-01-01 DIAGNOSIS — L03116 Cellulitis of left lower limb: Secondary | ICD-10-CM | POA: Diagnosis not present

## 2024-01-01 DIAGNOSIS — E782 Mixed hyperlipidemia: Secondary | ICD-10-CM

## 2024-01-01 DIAGNOSIS — F411 Generalized anxiety disorder: Secondary | ICD-10-CM

## 2024-01-01 DIAGNOSIS — I10 Essential (primary) hypertension: Secondary | ICD-10-CM | POA: Diagnosis not present

## 2024-01-01 NOTE — Progress Notes (Signed)
 Established Patient Office Visit  Subjective:  Patient ID: [name], female    DOB: 10/25/1940  Age: 83 y.o. MRN: [id number]  Chief Complaint  Patient presents with   Follow-up    1 week follow up    Patient comes in for follow-up today.  She is feeling much better now.  She completed her doxycycline  left leg shows marked improvement.  She has minimal erythema but no swelling, stasis dermatitis.  Her blood pressure is also looking better.  She has been advised to cut down her olmesartan  to once a day only and monitor her blood pressure readings at home. Denies chest pain or shortness of breath, no palpitations.  She will be traveling soon so advised to wear her compression stockings.    No other concerns at this time.   Past Medical History:  Diagnosis Date   Acid reflux    Allergy    Anemia    Anxiety    in past   Arthritis    all over   Asthma    Atypical moles    Bronchitis    Bronchospasm    reactive with perfume/cologne and smoke   CAD (coronary artery disease)    patient denies on preop of 10/24    Candida esophagitis (HCC)    Carotid artery calcification, bilateral 04/2016   Carpal tunnel syndrome of left wrist 12/03/2019   Cataract    Cervical stenosis of spine    Chronic insomnia    Complication of anesthesia    shoulder surgery- not all the way asleep   COVID-19 02/04/2021   also 10/3 or 04/20/21 sxs started 04/05/21   Depression    in past   Diverticulitis    Diverticulosis    Dizziness    Elevated liver enzymes    Family history of adverse reaction to anesthesia    sister- has problems with nausea and vomiting    Fatigue    Fatty liver    Generalized headache    migraines   GERD (gastroesophageal reflux disease)    Gross hematuria 08/18/2022   Hereditary and idiopathic peripheral neuropathy 05/11/2015   History of chemotherapy    topical cream for h/o skin CA   History of kidney stones    History of neuropathy    FH Dr. Lydia Sams neurology    History of vertigo    Hx of migraines    Hyperlipidemia    Hypothyroidism    Insomnia    Irritable bowel syndrome    LLQ abdominal pain    Melanoma (HCC) 2013   Melanoma (HCC)    x2 stg II melanoma   Neuropathy, peripheral    Peripheral neuropathy    Pneumonia    hx of x 2   PONV (postoperative nausea and vomiting)    Pre-diabetes    Retina disorder    left   SCC (squamous cell carcinoma)    skin    Skin cancer 07/17/2019   SCC left leg s/p mohs   SCC 05/01/18 left medial mid leg, right post mid leg 06/08/18, left mid well 04/10/19  Dr. Joanne Muckle derm, Dr. Eva Hikes mohs       Stroke [facility])    ? TIA per Dr V per patient    Thyroid  disease    Urine incontinence    Viral URI with cough 06/09/2022   Vitamin D  deficiency    Weight gain     Past Surgical History:  Procedure Laterality Date   ABLATION  Bilateral    Dr. Vonna Guardian 12/2020 veins laser ablation of both great saphenous veins in a staged fashion.   BILATERAL SALPINGOOPHORECTOMY     ovarian cyst Dr. Alva Jewels and Dr. Willy Harvest 2008   BREAST SURGERY     in 2014 bx breast calcifications   BREAST SURGERY     augmentation with saline Dr. Ceclia Cohens 1995    CHOLECYSTECTOMY N/A 01/28/2015   Procedure: LAPAROSCOPIC CHOLECYSTECTOMY WITH INTRAOPERATIVE CHOLANGIOGRAM;  Surgeon: Lillette Reid III, MD;  Location: [facility] OR;  Service: General;  Laterality: N/A;   CLOSED MANIPULATION SHOULDER     rt   COLONOSCOPY WITH PROPOFOL  N/A 11/20/2012   Procedure: COLONOSCOPY WITH PROPOFOL ;  Surgeon: Garrett Kallman, MD;  Location: [facility] END[facility]COPY;  Service: Endoscopy;  Laterality: N/A;   colonscopy     May 2014   FOOT BONE EXCISION     JOINT REPLACEMENT     RTK   KNEE ARTHROSCOPY     left knee miniscus tear repair     MELANOMA EXCISION WITH SENTINEL LYMPH NODE BIOPSY  03/18/2011   Back, nodes neg   OOPHORECTOMY     ROTATOR CUFF REPAIR     left   SHOULDER SURGERY     right and left    SKIN CANCER EXCISION     multiple   TOTAL HIP  ARTHROPLASTY     TOTAL KNEE ARTHROPLASTY     right 2010 and left in 2018    TOTAL KNEE ARTHROPLASTY Left 05/16/2017   Procedure: LEFT TOTAL KNEE ARTHROPLASTY;  Surgeon: Claiborne Crew, MD;  Location: [facility] ORS;  Service: Orthopedics;  Laterality: Left;  70 mins   TUBAL LIGATION     TUMOR REMOVAL     ovary    Social History   Socioeconomic History   Marital status: Divorced    Spouse name: Not on file   Number of children: 3   Years of education: College   Highest education level: Some college, no degree  Occupational History   Occupation: Retired    Associate Professor: RETIRED  Tobacco Use   Smoking status: Never   Smokeless tobacco: Never  Vaping Use   Vaping status: Never Used  Substance and Sexual Activity   Alcohol use: Yes    Alcohol/week: 1.0 standard drink of alcohol    Types: 1 Cans of beer per week    Comment: mixed drink about two times a year   Drug use: No   Sexual activity: Not Currently  Other Topics Concern   Not on file  Social History Narrative   Patient lives at home alone in a one story home.   Has 3 children (2 daughters and 1 son)    Retired from [employer].   Divorced since 1992    Caffeine Use: 1 cup daily and a soft drink occasionally    No guns    Wears seat belt     Lives with cat    Left handed   Social Drivers of Health   Financial Resource Strain: Low Risk  (04/18/2023)   Overall Financial Resource Strain (CARDIA)    Difficulty of Paying Living Expenses: Not hard at all  Food Insecurity: No Food Insecurity (12/18/2023)   Hunger Vital Sign    Worried About Running Out of Food in the Last Year: Never true    Ran Out of Food in the Last Year: Never true  Transportation Needs: No Transportation Needs (12/18/2023)   PRAPARE - Transportation    Lack of Transportation (  Medical): No    Lack of Transportation (Non-Medical): No  Physical Activity: Inactive (04/18/2023)   Exercise Vital Sign    Days of Exercise per Week: 0 days    Minutes of  Exercise per Session: 0 min  Stress: Stress Concern Present (04/18/2023)   [employer] of Occupational Health - Occupational Stress Questionnaire    Feeling of Stress : To some extent  Social Connections: Moderately Integrated (12/18/2023)   Social Connection and Isolation Panel    Frequency of Communication with Friends and Family: More than three times a week    Frequency of Social Gatherings with Friends and Family: Three times a week    Attends Religious Services: More than 4 times per year    Active Member of Clubs or Organizations: Yes    Attends Banker Meetings: More than 4 times per year    Marital Status: Divorced  Intimate Partner Violence: Not At Risk (12/18/2023)   Humiliation, Afraid, Rape, and Kick questionnaire    Fear of Current or Ex-Partner: No    Emotionally Abused: No    Physically Abused: No    Sexually Abused: No    Family History  Problem Relation Age of Onset   Hypertension Mother    Stroke Mother    Hyperlipidemia Mother    Heart disease Father    Diabetes Father    Hypertension Father    Hyperlipidemia Father    Early death Father    Diabetes Sister    Heart disease Sister        heart murmur   Arthritis Sister    COPD Sister    Hyperlipidemia Sister    Hypertension Sister    Colon polyps Sister        adenomous   Arthritis Sister    Heart disease Sister    Hyperlipidemia Sister    Hypertension Sister    Cancer Sister    Dementia Sister    Tremor Sister    Stroke Maternal Grandmother    Colon cancer Maternal Grandfather    Cancer Maternal Grandfather    Early death Maternal Grandfather    Early death Paternal Grandmother    Stroke Paternal Grandmother    Heart disease Daughter    Hyperlipidemia Daughter    Hypertension Daughter    Heart disease Son    Hyperlipidemia Son    Alcohol abuse Son    Depression Son    Diabetes Son    Pancreatic cancer Maternal Uncle    Alzheimer's disease Paternal Aunt    Alzheimer's  disease Paternal Aunt    Alzheimer's disease Paternal Aunt    Alzheimer's disease Paternal Aunt    Cancer Other        2 paternal uncles and 1 maternal uncle colon cancer   Esophageal cancer Neg Hx    Stomach cancer Neg Hx     Allergies  Allergen Reactions   Epinephrine  Other (See Comments) and Anaphylaxis    Heart palpitations   Other Anaphylaxis    Black pepper- caused bronchial tubes to start closing    Penicillins Swelling and Other (See Comments)    Tolerated rocephin  within past year Lip swelling Has patient had a PCN reaction causing immediate rash, facial/tongue/throat swelling, SOB or lightheadedness with hypotension: Yes Has patient had a PCN reaction causing severe rash involving mucus membranes or skin necrosis: No Has patient had a PCN reaction that required hospitalization No Has patient had a PCN reaction occurring within the last 10 years: No  If all of the above answers are NO, then may proceed with Cephalosporin use.   Valium [Diazepam] Other (See Comments)    Other reaction(s): Other (See Comments) Overly sensitive  sedation    Outpatient Medications Prior to Visit  Medication Sig   albuterol  (PROAIR  HFA) 108 (90 Base) MCG/ACT inhaler Inhale 1-2 puffs into the lungs every 6 (six) hours as needed for wheezing or shortness of breath.   aspirin  81 MG tablet TAKE 1 TABLET BY MOUTH EVERY DAY   atorvastatin  (LIPITOR) 20 MG tablet Take 1 tablet (20 mg total) by mouth daily.   Biotin 1000 MCG tablet Take 1,000 mcg by mouth daily.   BREO ELLIPTA  100-25 MCG/ACT AEPB Inhale 1 puff into the lungs daily. Inhale 1 puff by mouth once daily   Calcium  600-200 MG-UNIT tablet Take 1 tablet daily by mouth.   Cholecalciferol  (VITAMIN D3) 2000 UNITS capsule Take 2,000 Units by mouth daily.   cholestyramine  (QUESTRAN ) 4 g packet Take 4 g by mouth 2 (two) times daily as needed.   DULoxetine  (CYMBALTA ) 60 MG capsule Take 1 capsule (60 mg total) by mouth daily.   FOLIC ACID  PO Take  by mouth daily.   levothyroxine  (SYNTHROID ) 75 MCG tablet Take 1 tablet (75 mcg total) by mouth daily before breakfast.   Magnesium  250 MG TABS Take by mouth.   olmesartan  (BENICAR ) 5 MG tablet Take 2 tablets (10 mg total) by mouth 2 (two) times daily.   Omega-3 Fatty Acids (FISH OIL) 1000 MG CAPS Take 1,000 mg by mouth daily.    vitamin B-12 (CYANOCOBALAMIN ) 1000 MCG tablet Take 2,000 mcg by mouth daily. 2 tabs   Vonoprazan Fumarate  (VOQUEZNA ) 10 MG TABS Take 10 mg by mouth daily.   [DISCONTINUED] doxycycline  (VIBRA -TABS) 100 MG tablet Take 1 tablet (100 mg total) by mouth 2 (two) times daily. (Patient not taking: Reported on 01/01/2024)   No facility-administered medications prior to visit.    Review of Systems  Constitutional: Negative.  Negative for chills, fever, malaise/fatigue and weight loss.  HENT: Negative.  Negative for sore throat.   Eyes: Negative.   Respiratory: Negative.  Negative for cough and shortness of breath.   Cardiovascular: Negative.  Negative for chest pain, palpitations and leg swelling.  Gastrointestinal: Negative.  Negative for abdominal pain, constipation, diarrhea, heartburn, nausea and vomiting.  Genitourinary: Negative.  Negative for dysuria and flank pain.  Musculoskeletal: Negative.  Negative for joint pain and myalgias.  Skin: Negative.   Neurological: Negative.  Negative for dizziness, tingling, tremors, sensory change and headaches.  Endo/Heme/Allergies: Negative.   Psychiatric/Behavioral: Negative.  Negative for depression and suicidal ideas. The patient is not nervous/anxious.        Objective:   BP 110/70   Pulse 73   Ht 5' 6 (1.676 m)   Wt 181 lb 12.8 oz (82.5 kg)   SpO2 96%   BMI 29.34 kg/m   Vitals:   01/01/24 1128  BP: 110/70  Pulse: 73  Height: 5' 6 (1.676 m)  Weight: 181 lb 12.8 oz (82.5 kg)  SpO2: 96%  BMI (Calculated): 29.36    Physical Exam Vitals and nursing note reviewed.  Constitutional:      Appearance: Normal  appearance.  HENT:     Head: Normocephalic and atraumatic.     Nose: Nose normal.     Mouth/Throat:     Mouth: Mucous membranes are moist.     Pharynx: Oropharynx is clear.   Eyes:     Conjunctiva/sclera: Conjunctivae  normal.     Pupils: Pupils are equal, round, and reactive to light.    Cardiovascular:     Rate and Rhythm: Normal rate and regular rhythm.     Pulses: Normal pulses.     Heart sounds: Normal heart sounds. No murmur heard. Pulmonary:     Effort: Pulmonary effort is normal.     Breath sounds: Normal breath sounds. No wheezing.  Abdominal:     General: Bowel sounds are normal.     Palpations: Abdomen is soft.     Tenderness: There is no abdominal tenderness. There is no right CVA tenderness or left CVA tenderness.   Musculoskeletal:        General: Normal range of motion.     Cervical back: Normal range of motion.     Right lower leg: No edema.     Left lower leg: No edema.   Skin:    General: Skin is warm and dry.   Neurological:     General: No focal deficit present.     Mental Status: She is alert and oriented to person, place, and time.   Psychiatric:        Mood and Affect: Mood normal.        Behavior: Behavior normal.      No results found for any visits on 01/01/24.  Recent Results (from the past 2160 hours)  Surgical pathology ([facility])     Status: None   Collection Time: 10/05/23 12:00 AM  Result Value Ref Range   SURGICAL PATHOLOGY      SURGICAL PATHOLOGY [facility] [address] [place], [place] [address] Telephone [phone] or [phone] Fax [phone]  REPORT OF SURGICAL PATHOLOGY   Accession #: [id number] Patient Name: AISSA, LISOWSKI Visit # : [id number]  MRN: [id number] Physician: Alvester Johnson DOB/Age 02/19/1941 (Age: 53) Gender: F Collected Date: 10/05/2023 Received Date: 10/06/2023  FINAL DIAGNOSIS       1. Surgical [P], gastric :       - MILD REACTIVE  GASTROPATHY.      - NEGATIVE FOR H. PYLORI ON H&E STAIN      - NO INTESTINAL METAPLASIA, DYSPLASIA, OR MALIGNANCY.       DATE SIGNED OUT: 10/09/2023 ELECTRONIC SIGNATURE Almeda Jacobs M.D., Nupur, Pathologist, Electronic Signature  MICROSCOPIC DESCRIPTION  CASE COMMENTS STAINS USED IN DIAGNOSIS: H&E    CLINICAL HISTORY  SPECIMEN(S) OBTAINED 1. Surgical [P], Gastric  SPECIMEN COMMENTS: 1. Gastroesophageal reflux disease with esophagitis and hemorrhage; mucosal abnormality of sto mach SPECIMEN CLINICAL INFORMATION: 1. R/O H. pylori    Gross Description 1. Received in formalin are tan, soft tissue fragments that are submitted in toto. Number: 2 Size: 0.4 cm, (1B) ( TA )        Report signed out from the following location(s) . [facility] [address] [place], [place] [address] CLIA #: [id number]  [facility] [address] [place], [place] [address] CLIA #: [id number]   POCT Urinalysis Dipstick [phone])     Status: None   Collection Time: 10/30/23  1:58 PM  Result Value Ref Range   Color, UA Yellow    Clarity, UA Clear    Glucose, UA Negative Negative   Bilirubin, UA Negative    Ketones, UA Trace    Spec Grav, UA 1.015 1.010 - 1.025   Blood, UA Negative    pH, UA 5.5 5.0 - 8.0   Protein, UA Negative Negative  Urobilinogen, UA 0.2 0.2 or 1.0 E.U./dL   Nitrite, UA Negative    Leukocytes, UA Negative Negative   Appearance Clear    Odor No   POCT Urinalysis Dipstick ([address])     Status: Abnormal   Collection Time: 11/07/23 11:56 AM  Result Value Ref Range   Color, UA Orange    Clarity, UA Clear    Glucose, UA Negative Negative   Bilirubin, UA Negative    Ketones, UA Negative    Spec Grav, UA 1.015 1.010 - 1.025   Blood, UA Negative    pH, UA 5.5 5.0 - 8.0   Protein, UA Negative Negative   Urobilinogen, UA 0.2 0.2 or 1.0 E.U./dL   Nitrite, UA Negative    Leukocytes, UA Small (1+) (A) Negative   Appearance Clear     Odor Yes   CBC     Status: None   Collection Time: 12/18/23  9:37 AM  Result Value Ref Range   WBC 5.7 4.0 - 10.5 K/uL   RBC 4.58 3.87 - 5.11 MIL/uL   Hemoglobin 13.3 12.0 - 15.0 g/dL   HCT 60.4 54.0 - 98.1 %   MCV 88.0 80.0 - 100.0 fL   MCH 29.0 26.0 - 34.0 pg   MCHC 33.0 30.0 - 36.0 g/dL   RDW 19.1 47.8 - 29.5 %   Platelets 242 150 - 400 K/uL   nRBC 0.0 0.0 - 0.2 %    Comment: Performed at [facility], [address] [place], [place] [address]  Comprehensive metabolic panel     Status: Abnormal   Collection Time: 12/18/23  9:37 AM  Result Value Ref Range   Sodium 137 135 - 145 mmol/L   Potassium 4.6 3.5 - 5.1 mmol/L   Chloride 106 98 - 111 mmol/L   CO2 20 (L) 22 - 32 mmol/L   Glucose, Bld 128 (H) 70 - 99 mg/dL    Comment: Glucose reference range applies only to samples taken after fasting for at least 8 hours.   BUN 25 (H) 8 - 23 mg/dL   Creatinine, Ser 8.65 (H) 0.44 - 1.00 mg/dL   Calcium  9.2 8.9 - 10.3 mg/dL   Total Protein 7.1 6.5 - 8.1 g/dL   Albumin 4.0 3.5 - 5.0 g/dL   AST 21 15 - 41 U/L   ALT 20 0 - 44 U/L   Alkaline Phosphatase 71 38 - 126 U/L   Total Bilirubin 1.5 (H) 0.0 - 1.2 mg/dL   GFR, Estimated 37 (L) >60 mL/min    Comment: (NOTE) Calculated using the CKD-EPI Creatinine Equation (2021)    Anion gap 11 5 - 15    Comment: Performed at [facility], [address] [place], [place] [address]  Lactic acid, plasma     Status: Abnormal   Collection Time: 12/18/23  9:37 AM  Result Value Ref Range   Lactic Acid, Venous 2.8 (HH) 0.5 - 1.9 mmol/L    Comment: CRITICAL RESULT CALLED TO, READ BACK BY AND VERIFIED WITH ASHLEY GILMORE 12/18/23 1022 MW Performed at [facility], [address], [place], [place] [address]   Blood Culture (routine x 2)     Status: None   Collection Time: 12/18/23 10:33 AM   Specimen: BLOOD  Result Value Ref Range   Specimen Description BLOOD BLOOD RIGHT ARM    Special Requests      BOTTLES DRAWN  AEROBIC AND ANAEROBIC Blood Culture adequate volume   Culture  NO GROWTH 5 DAYS Performed at [facility], [address] [place], [place] [address]    Report Status 12/23/2023 FINAL   Lactic acid, plasma     Status: None   Collection Time: 12/18/23 12:07 PM  Result Value Ref Range   Lactic Acid, Venous 1.5 0.5 - 1.9 mmol/L    Comment: Performed at [facility], [address] [place], [place] [address]  Blood Culture (routine x 2)     Status: None   Collection Time: 12/18/23 12:07 PM   Specimen: BLOOD  Result Value Ref Range   Specimen Description BLOOD BLOOD LEFT HAND    Special Requests      BOTTLES DRAWN AEROBIC AND ANAEROBIC Blood Culture adequate volume   Culture      NO GROWTH 5 DAYS Performed at [facility], [address] [place], [place] [address]    Report Status 12/23/2023 FINAL   Urinalysis, w/ Reflex to Culture (Infection Suspected) -Urine, Clean Catch     Status: Abnormal   Collection Time: 12/18/23 12:19 PM  Result Value Ref Range   Specimen Source URINE, CLEAN CATCH    Color, Urine YELLOW YELLOW   APPearance CLEAR CLEAR   Specific Gravity, Urine >1.030 (H) 1.005 - 1.030   pH 5.5 5.0 - 8.0   Glucose, UA NEGATIVE NEGATIVE mg/dL   Hgb urine dipstick NEGATIVE NEGATIVE   Bilirubin Urine SMALL (A) NEGATIVE   Ketones, ur NEGATIVE NEGATIVE mg/dL   Protein, ur NEGATIVE NEGATIVE mg/dL   Nitrite NEGATIVE NEGATIVE   Leukocytes,Ua SMALL (A) NEGATIVE   Squamous Epithelial / HPF 0-5 0 - 5 /HPF   WBC, UA 0-5 0 - 5 WBC/hpf    Comment: Reflex urine culture not performed if WBC <=10, OR if Squamous epithelial cells >5. If Squamous epithelial cells >5, suggest recollection.   RBC / HPF 0-5 0 - 5 RBC/hpf   Bacteria, UA NONE SEEN NONE SEEN    Comment: Performed at [facility], [address] [place], [place] [address]  Resp panel by RT-PCR (RSV, Flu A&B, Covid) Anterior Nasal Swab     Status: None   Collection Time: 12/18/23   1:03 PM   Specimen: Anterior Nasal Swab  Result Value Ref Range   SARS Coronavirus 2 by RT PCR NEGATIVE NEGATIVE    Comment: (NOTE) SARS-CoV-2 target nucleic acids are NOT DETECTED.  The SARS-CoV-2 RNA is generally detectable in upper respiratory specimens during the acute phase of infection. The lowest concentration of SARS-CoV-2 viral copies this assay can detect is 138 copies/mL. A negative result does not preclude SARS-Cov-2 infection and should not be used as the sole basis for treatment or other patient management decisions. A negative result may occur with  improper specimen collection/handling, submission of specimen other than nasopharyngeal swab, presence of viral mutation(s) within the areas targeted by this assay, and inadequate number of viral copies(<138 copies/mL). A negative result must be combined with clinical observations, patient history, and epidemiological information. The expected result is Negative.  Fact Sheet for Patients:  BloggerCourse.com  Fact Sheet for Healthcare Providers:  SeriousBroker.it  This test is no t yet approved or cleared by the [place]  FDA and  has been authorized for detection and/or diagnosis of SARS-CoV-2 by FDA under an Emergency Use Authorization (EUA). This EUA will remain  in effect (meaning this test can be used) for the duration of the COVID-19 declaration under Section 564(b)(1) of the Act, 21 U.S.C.section 360bbb-3(b)(1), unless the authorization is  terminated  or revoked sooner.       Influenza A by PCR NEGATIVE NEGATIVE   Influenza B by PCR NEGATIVE NEGATIVE    Comment: (NOTE) The Xpert Xpress SARS-CoV-2/FLU/RSV plus assay is intended as an aid in the diagnosis of influenza from Nasopharyngeal swab specimens and should not be used as a sole basis for treatment. Nasal washings and aspirates are unacceptable for Xpert Xpress SARS-CoV-2/FLU/RSV testing.  Fact Sheet  for Patients: BloggerCourse.com  Fact Sheet for Healthcare Providers: SeriousBroker.it  This test is not yet approved or cleared by the [place]  FDA and has been authorized for detection and/or diagnosis of SARS-CoV-2 by FDA under an Emergency Use Authorization (EUA). This EUA will remain in effect (meaning this test can be used) for the duration of the COVID-19 declaration under Section 564(b)(1) of the Act, 21 U.S.C. section 360bbb-3(b)(1), unless the authorization is terminated or revoked.     Resp Syncytial Virus by PCR NEGATIVE NEGATIVE    Comment: (NOTE) Fact Sheet for Patients: BloggerCourse.com  Fact Sheet for Healthcare Providers: SeriousBroker.it  This test is not yet approved or cleared by the [place]  FDA and has been authorized for detection and/or diagnosis of SARS-CoV-2 by FDA under an Emergency Use Authorization (EUA). This EUA will remain in effect (meaning this test can be used) for the duration of the COVID-19 declaration under Section 564(b)(1) of the Act, 21 U.S.C. section 360bbb-3(b)(1), unless the authorization is terminated or revoked.  Performed at [facility], [address] [place], [place] [address]   Basic metabolic panel     Status: Abnormal   Collection Time: 12/19/23  3:07 AM  Result Value Ref Range   Sodium 137 135 - 145 mmol/L   Potassium 4.9 3.5 - 5.1 mmol/L   Chloride 109 98 - 111 mmol/L   CO2 22 22 - 32 mmol/L   Glucose, Bld 111 (H) 70 - 99 mg/dL    Comment: Glucose reference range applies only to samples taken after fasting for at least 8 hours.   BUN 27 (H) 8 - 23 mg/dL   Creatinine, Ser 9.62 (H) 0.44 - 1.00 mg/dL   Calcium  8.5 (L) 8.9 - 10.3 mg/dL   GFR, Estimated 45 (L) >60 mL/min    Comment: (NOTE) Calculated using the CKD-EPI Creatinine Equation (2021)    Anion gap 6 5 - 15    Comment: Performed at  [facility], [address] [place], [place] [address]      Assessment & Plan:  Patient advised to monitor her blood pressure at home.  Just to take 10 mg of olmesartan  in the morning.  Will increase the dose if the blood pressure starts to show high readings. Problem List Items Addressed This Visit     Hyperlipidemia   Essential hypertension, benign - Primary   GAD (generalized anxiety disorder)   Cellulitis    Return in about 3 weeks (around 01/22/2024).   Total time spent: 30 minutes  Aisha Hove, MD  01/01/2024   This document may have been prepared by [facility] Voice Recognition software and as such may include unintentional dictation errors.

## 2024-01-03 DIAGNOSIS — C44729 Squamous cell carcinoma of skin of left lower limb, including hip: Secondary | ICD-10-CM | POA: Diagnosis not present

## 2024-01-05 NOTE — Progress Notes (Signed)
 Recall updated to 6 months.

## 2024-01-09 ENCOUNTER — Telehealth: Payer: Self-pay | Admitting: Gastroenterology

## 2024-01-09 ENCOUNTER — Telehealth: Payer: Self-pay | Admitting: Cardiovascular Disease

## 2024-01-09 NOTE — Telephone Encounter (Signed)
 Left voicemail message to call back

## 2024-01-09 NOTE — Telephone Encounter (Signed)
 Patient returning call.

## 2024-01-09 NOTE — Telephone Encounter (Signed)
 Inbound call from patient, states the medication she has been taking for vomiting and nausea. She states the medication was split in dosage and now is not working, would like to speak with a nurse in regards.

## 2024-01-09 NOTE — Telephone Encounter (Signed)
 Pt c/o BP issue: STAT if pt c/o blurred vision, one-sided weakness or slurred speech.   1. What is your BP concern?  BP is elevated. Systolic remaining in the 160's.  2. Have you taken any BP medication today? Yes   3. What are your last 5 BP readings? 6/24: 164/91 66  4. Are you having any other symptoms (ex. Dizziness, headache, blurred vision, passed out)?  Fluttering the past few days, left shoulder pain last week

## 2024-01-09 NOTE — Telephone Encounter (Signed)
 Left message for patient to call back

## 2024-01-09 NOTE — Telephone Encounter (Deleted)
 Left message for patient to call back

## 2024-01-10 NOTE — Telephone Encounter (Signed)
 Patient is returning RN's call. States her phone was on silent causing her to miss the cb, but she has the ringer on now.   Please advise.

## 2024-01-10 NOTE — Telephone Encounter (Signed)
 Called patient and left message for call back.

## 2024-01-10 NOTE — Telephone Encounter (Signed)
 Called and spoke with patient. Patient states that she was recently admitted to the hospital for sepsis. Patient with complaint of intermittent heart palpitations, left shoulder pain and feeling tired that started 2- 3 days ago. Patient also reports that her blood pressure has been elevated. The last two readings are 166/92 and 157/92. Patient states that she is currently taking Olmesartan  10 MG twice daily. Patient scheduled to be seen in clinic 01/11/24.

## 2024-01-11 ENCOUNTER — Ambulatory Visit: Attending: Student | Admitting: Student

## 2024-01-11 ENCOUNTER — Encounter: Payer: Self-pay | Admitting: Student

## 2024-01-11 VITALS — BP 124/78 | HR 75 | Ht 66.0 in | Wt 184.0 lb

## 2024-01-11 DIAGNOSIS — I10 Essential (primary) hypertension: Secondary | ICD-10-CM

## 2024-01-11 DIAGNOSIS — E785 Hyperlipidemia, unspecified: Secondary | ICD-10-CM

## 2024-01-11 DIAGNOSIS — R0609 Other forms of dyspnea: Secondary | ICD-10-CM | POA: Diagnosis not present

## 2024-01-11 DIAGNOSIS — R002 Palpitations: Secondary | ICD-10-CM | POA: Diagnosis not present

## 2024-01-11 DIAGNOSIS — I2510 Atherosclerotic heart disease of native coronary artery without angina pectoris: Secondary | ICD-10-CM

## 2024-01-11 NOTE — Progress Notes (Signed)
 Cardiology Clinic Note   Date: 01/11/2024 ID: YUVIA PLANT, DOB 1941-02-07, MRN [id number]  Primary Cardiologist:  Evalene Lunger, MD  Chief Complaint   [name] is a 83 y.o. female who presents to the clinic today for evaluation of elevated BP and palpitations.   Patient Profile   [name] is followed by Dr. Gollan for the history outlined below.      Past medical history significant for: Coronary artery calcifications. Carotid artery stenosis. Carotid ultrasound 03/04/2021: Minimal to moderate amount of bilateral atherosclerotic plaque not resulting in hemodynamically significant stenosis within either ICA. Hypertension. Hyperlipidemia. Lipid panel 08/11/2023: LDL 57, HDL 78, TG 84, total 151. CVA. Asthma. GERD. Hiatal hernia. Prediabetes. Varicose veins. S/p laser ablation. Lymphedema.  In summary, patient was initially followed by Dr. Monette in in 2017 for complaints of shortness of breath and chest tightness.  She underwent nuclear stress testing which was normal and a low risk study.  Echo at that time demonstrated normal LV/RV function, Grade I DD, normal PA pressure.  She establish care with Dr. Gollan on 04/11/2017.  Repeat echo in June 2021 demonstrated EF 60 to 65%, no RWMA, normal diastolic parameters, normal RV size/function, normal PA pressure, mild MR, mild to moderate TR.  Repeat nuclear stress testing was normal, low risk study.  Had an office visit in March 2022 patient reported irregular heart rhythm detected on home blood pressure cuff.  She denied palpitations but was experiencing dizziness, lightheadedness and shortness of breath.  Of note she was previously treated for vestibular issues with vestibular therapy.  She wore 2-week ZIO which showed HR 51 to 164 bpm, average 72 bpm, 1 run of SVT lasting 4 beats max rate 164 bpm, rare ectopy.  Patient was last seen in the office by Dr. Gollan on 01/11/2023.  She complained of leg weakness and fatigue.  She  was encouraged to start a walking program.  No medication changes were made.  Patient underwent hospital admission for sepsis from 6/2 to 12/19/2023.  She had been treated for cellulitis of the left lower extremity while at the beach.  When she returned home she had completed her course of antibiotic but was still feeling unwell so followed up with her PCP.  While at her PCPs office she began vomiting and was sent to the ED.  She was normotensive and afebrile upon the ED.  CBC was unremarkable.  BUN 25, creatinine 1.4, eGFR 37, respiratory panel negative.  UA with small leukocytes and increased specific gravity.  Left lower extremity Doppler negative for DVT.  She was started on IV fluids and IV antibiotics and admitted overnight.  Patient contacted the office on 01/10/2024 with complaints of palpitations, left shoulder pain, elevated BP.  Per triage RN, Called and spoke with patient. Patient states that she was recently admitted to the hospital for sepsis. Patient with complaint of intermittent heart palpitations, left shoulder pain and feeling tired that started 2- 3 days ago. Patient also reports that her blood pressure has been elevated. The last two readings are 166/92 and 157/92. Patient states that she is currently taking Olmesartan  10 MG twice daily. Patient scheduled to be seen in clinic 01/11/24.SABRA     History of Present Illness    Today, patient reports she was at the beach in May and was feeling unwell and she thought she had the flu. She developed redness of her lower extremity and was treated for cellulitis. During that time she developed left shoulder  pain  that occurred when laying down. She was unable to qualify or quantify the episodes of pain. After she arrived home from the beach she presented to the ED and was admitted for sepsis as detailed above. Since that hospital admission she notes palpitations occurring at night when she first gets into bed described as fluttering and an occasional  booming sensation that lasts 5-10 minutes and do not return. Palpitations are not disturbing her sleep. She reports getting up frequently to urinate in the middle of the night. She reports a long history of chronic DOE that seems to be progressively worsening. She is able to complete light to moderate tasks around her home but has to take frequent breaks secondary to dyspnea. She denies orthopnea or PND. She reports being under an increased amount of stress recently. She is upset that she is missing her grandson's wedding and she just recently lost her best friend. She does lymphedema treatments twice a day.      ROS: All other systems reviewed and are otherwise negative except as noted in History of Present Illness.  EKGs/Labs Reviewed    EKG Interpretation Date/Time:  Thursday January 11 2024 13:36:14 EDT Ventricular Rate:  75 PR Interval:  120 QRS Duration:  62 QT Interval:  352 QTC Calculation: 393 R Axis:   -4  Text Interpretation: Normal sinus rhythm Low voltage QRS When compared with ECG of 21-Mar-2023 15:13, No significant change was found Confirmed by Loistine Sober [phone]) on 01/11/2024 1:44:33 PM   12/18/2023: ALT 20; AST 21 12/19/2023: BUN 27; Creatinine, Ser 1.19; Potassium 4.9; Sodium 137   12/18/2023: Hemoglobin 13.3; WBC 5.7   08/11/2023: TSH 2.550    Physical Exam    VS:  BP 124/78   Pulse 75   Ht 5' 6 (1.676 m)   Wt 184 lb (83.5 kg)   SpO2 98%   BMI 29.70 kg/m  , BMI Body mass index is 29.7 kg/m.  GEN: Well nourished, well developed, in no acute distress. Neck: No JVD or carotid bruits. Cardiac:  RRR. No murmur. No rubs or gallops.   Respiratory:  Respirations regular and unlabored. Clear to auscultation without rales, wheezing or rhonchi. GI: Soft, nontender, nondistended. Extremities: Radials/DP/PT 2+ and equal bilaterally. No clubbing or cyanosis. Mild nonpitting edema with chronic skin color changes L>R lower extremities.   Skin: Warm and dry, no  rash. Neuro: Strength intact.  Assessment & Plan   Palpitations Patient reports since the beginning of June she has experienced palpitations at night when she first gets into bed. Episodes are described as fluttering with an occasional booming sensation that lasts 5-10 minutes. It does not happen every night and when it does it is only one episode. She gets up frequently to urinate throughout the night and does not have a recurrence of palpitations at those times. Discussed evaluating palpitations with a Zio monitor. She would like to hold off for now. She will contact the office if palpitations become more frequent or persistent.  - Defer Zio monitoring for now. She will contact office for more frequent or persistent palpitations.   Hypertension BP today 124/78. Patient reports SBP today >160 prior to medication. She is going to bring her home BP cuff to her PCP visit in 2 weeks to correlate the readings. She is provided with a BP log to check BP 2 hours after medications. She is instructed to bring log to PCP visit.  - Continue olmesartan .  Chronic DOE Patient has a long history  of DOE. She reports in the last year it seems to be progressively worsening. She is able to do light to moderate household activities but has to rest frequently. Lungs clear to auscultation on exam.  - Schedule echo.   Coronary artery calcifications seen on CT   Normal, low risk nuclear stress test in June 2021.  Patient reports a few days of left shoulder pain in May that has since resolved. Shoulder pain occurred when in bed. No pain with exertion. She denies chest pain. Ischemic evaluation not clinically indicated at this time. EKG without acute changes.  - Continue aspirin , atorvastatin .  Hyperlipidemia LDL 57 January 2025, at goal. - Continue atorvastatin .  Disposition: Echo. Return in 3 months or sooner as needed.          Signed, Barnie HERO. Dot Splinter, DNP, NP-C

## 2024-01-11 NOTE — Patient Instructions (Addendum)
 Medication Instructions:  Your physician recommends that you continue on your current medications as directed. Please refer to the Current Medication list given to you today.   *If you need a refill on your cardiac medications before your next appointment, please call your pharmacy*  Lab Work: None ordered at this time  If you have labs (blood work) drawn today and your tests are completely normal, you will receive your results only by: MyChart Message (if you have MyChart) OR A paper copy in the mail If you have any lab test that is abnormal or we need to change your treatment, we will call you to review the results.  Testing/Procedures: Your physician has requested that you have an echocardiogram. Echocardiography is a painless test that uses sound waves to create images of your heart. It provides your doctor with information about the size and shape of your heart and how well your heart's chambers and valves are working.   You may receive an ultrasound enhancing agent through an IV if needed to better visualize your heart during the echo. This procedure takes approximately one hour.  There are no restrictions for this procedure.  This will take place at [address] [facility] [address], [place] [address]  Please note: We ask at that you not bring children with you during ultrasound (echo/ vascular) testing. Due to room size and safety concerns, children are not allowed in the ultrasound rooms during exams. Our front office staff cannot provide observation of children in our lobby area while testing is being conducted. An adult accompanying a patient to their appointment will only be allowed in the ultrasound room at the discretion of the ultrasound technician under special circumstances. We apologize for any inconvenience.   Follow-Up: At [facility], you and your health needs are our priority.  As part of our continuing mission to provide you with exceptional heart  care, our providers are all part of one team.  This team includes your primary Cardiologist (physician) and Advanced Practice Providers or APPs (Physician Assistants and Nurse Practitioners) who all work together to provide you with the care you need, when you need it.  Your next appointment:   3 month(s)  Provider:   Timothy Gollan, MD    We recommend signing up for the patient portal called MyChart.  Sign up information is provided on this After Visit Summary.  MyChart is used to connect with patients for Virtual Visits (Telemedicine).  Patients are able to view lab/test results, encounter notes, upcoming appointments, etc.  Non-urgent messages can be sent to your provider as well.   To learn more about what you can do with MyChart, go to [email or web address].   Please take your blood pressure 2 hours after taking your blood pressure medication.  Take your blood pressure cuff to your primary care provider's office at your next visit to make sure it is calibrated correctly.  Also please bring your blood pressure log with you at your next visit with us .

## 2024-01-12 ENCOUNTER — Ambulatory Visit: Payer: PPO | Admitting: Cardiovascular Disease

## 2024-01-12 NOTE — Telephone Encounter (Signed)
 [name], you need to check your pools .

## 2024-01-15 NOTE — Telephone Encounter (Signed)
 Okay. Can see refill her Voquezna  at 20mg , if covered by insurance?

## 2024-01-15 NOTE — Telephone Encounter (Signed)
 Patient returning a call from several days ago. She states that since changing to Voquezna  10 mg dosing, she has started having terrible reflux/burning again as well as several episodes of vomiting. States she was doing well on voquezna  20 mg daily with resolution of her symptoms. Patient notes that she was recently in the hospital for sepsis but is not sure what caused the sepsis (in looking at notes, it appears this was thought to be in relation to a complicated cellulitis of left leg; also had AKI).   Patient also c/o continued loose stools though no blood.

## 2024-01-15 NOTE — Telephone Encounter (Signed)
 Patient returning call. Please advise. Thank you.

## 2024-01-16 ENCOUNTER — Other Ambulatory Visit (HOSPITAL_COMMUNITY): Payer: Self-pay

## 2024-01-16 ENCOUNTER — Other Ambulatory Visit: Payer: Self-pay

## 2024-01-16 MED ORDER — VOQUEZNA 20 MG PO TABS
1.0000 | ORAL_TABLET | Freq: Every day | ORAL | 3 refills | Status: DC
  Filled 2024-01-16: qty 30, 30d supply, fill #0

## 2024-01-16 NOTE — Telephone Encounter (Signed)
 Looks like voquezna  20 mg is approved through 07/17/24 per recent chart notes. Rx for Voquezna  20 mg sent to [facility].  Left message for patient to call back.

## 2024-01-16 NOTE — Telephone Encounter (Signed)
 Patient is advised that we have sent updated script to [facility] for voquezna  20 mg daily in place of 10 mg daily dosing. She verbalizes understanding.

## 2024-01-17 ENCOUNTER — Other Ambulatory Visit (HOSPITAL_COMMUNITY): Payer: Self-pay

## 2024-01-18 ENCOUNTER — Other Ambulatory Visit (HOSPITAL_COMMUNITY): Payer: Self-pay

## 2024-01-22 ENCOUNTER — Ambulatory Visit (INDEPENDENT_AMBULATORY_CARE_PROVIDER_SITE_OTHER): Admitting: Internal Medicine

## 2024-01-22 ENCOUNTER — Encounter: Payer: Self-pay | Admitting: Internal Medicine

## 2024-01-22 VITALS — BP 130/70 | HR 71 | Ht 66.0 in | Wt 185.8 lb

## 2024-01-22 DIAGNOSIS — E538 Deficiency of other specified B group vitamins: Secondary | ICD-10-CM | POA: Diagnosis not present

## 2024-01-22 DIAGNOSIS — I10 Essential (primary) hypertension: Secondary | ICD-10-CM

## 2024-01-22 DIAGNOSIS — E782 Mixed hyperlipidemia: Secondary | ICD-10-CM

## 2024-01-22 DIAGNOSIS — I872 Venous insufficiency (chronic) (peripheral): Secondary | ICD-10-CM | POA: Diagnosis not present

## 2024-01-22 DIAGNOSIS — F411 Generalized anxiety disorder: Secondary | ICD-10-CM | POA: Diagnosis not present

## 2024-01-22 DIAGNOSIS — E038 Other specified hypothyroidism: Secondary | ICD-10-CM

## 2024-01-22 NOTE — Progress Notes (Signed)
 Established Patient Office Visit  Subjective:  Patient ID: [name], female    DOB: 09-Feb-1941  Age: 83 y.o. MRN: [id number]  Chief Complaint  Patient presents with   Follow-up    3 week follow up    Patient comes in for her follow-up today.  She is feeling better and her left leg swelling has significantly improved, with very mild residual erythema.  She forgets to put moisturizer on her legs for stasis dermatitis.  Since her last visit she had been to the cardiologist for palpitations and high blood pressure.  Today her blood pressure is stable and she is taking olmesartan  20 mg twice a day, will continue the current dosing. Patient needs fasting labs.    No other concerns at this time.   Past Medical History:  Diagnosis Date   Acid reflux    Allergy    Anemia    Anxiety    in past   Arthritis    all over   Asthma    Atypical moles    Bronchitis    Bronchospasm    reactive with perfume/cologne and smoke   CAD (coronary artery disease)    patient denies on preop of 10/24    Candida esophagitis (HCC)    Carotid artery calcification, bilateral 04/2016   Carpal tunnel syndrome of left wrist 12/03/2019   Cataract    Cervical stenosis of spine    Chronic insomnia    Complication of anesthesia    shoulder surgery- not all the way asleep   COVID-19 02/04/2021   also 10/3 or 04/20/21 sxs started 04/05/21   Depression    in past   Diverticulitis    Diverticulosis    Dizziness    Elevated liver enzymes    Family history of adverse reaction to anesthesia    sister- has problems with nausea and vomiting    Fatigue    Fatty liver    Generalized headache    migraines   GERD (gastroesophageal reflux disease)    Gross hematuria 08/18/2022   Hereditary and idiopathic peripheral neuropathy 05/11/2015   History of chemotherapy    topical cream for h/o skin CA   History of kidney stones    History of neuropathy    FH Dr. Tobie neurology   History of vertigo    Hx  of migraines    Hyperlipidemia    Hypothyroidism    Insomnia    Irritable bowel syndrome    LLQ abdominal pain    Melanoma (HCC) 2013   Melanoma (HCC)    x2 stg II melanoma   Neuropathy, peripheral    Peripheral neuropathy    Pneumonia    hx of x 2   PONV (postoperative nausea and vomiting)    Pre-diabetes    Retina disorder    left   SCC (squamous cell carcinoma)    skin    Skin cancer 07/17/2019   SCC left leg s/p mohs   SCC 05/01/18 left medial mid leg, right post mid leg 06/08/18, left mid well 04/10/19  Dr. Robinson derm, Dr. Bette Bon mohs       Stroke [facility])    ? TIA per Dr V per patient    Thyroid  disease    Urine incontinence    Viral URI with cough 06/09/2022   Vitamin D  deficiency    Weight gain     Past Surgical History:  Procedure Laterality Date   ABLATION Bilateral    Dr. Marea  12/2020 veins laser ablation of both great saphenous veins in a staged fashion.   BILATERAL SALPINGOOPHORECTOMY     ovarian cyst Dr. Lenon and Dr. Lindajean 2008   BREAST SURGERY     in 2014 bx breast calcifications   BREAST SURGERY     augmentation with saline Dr. Nelwyn Santa 1995    CHOLECYSTECTOMY N/A 01/28/2015   Procedure: LAPAROSCOPIC CHOLECYSTECTOMY WITH INTRAOPERATIVE CHOLANGIOGRAM;  Surgeon: Deward Null III, MD;  Location: [facility] OR;  Service: General;  Laterality: N/A;   CLOSED MANIPULATION SHOULDER     rt   COLONOSCOPY WITH PROPOFOL  N/A 11/20/2012   Procedure: COLONOSCOPY WITH PROPOFOL ;  Surgeon: Gladis MARLA Louder, MD;  Location: [facility] ENDOSCOPY;  Service: Endoscopy;  Laterality: N/A;   colonscopy     May 2014   FOOT BONE EXCISION     JOINT REPLACEMENT     RTK   KNEE ARTHROSCOPY     left knee miniscus tear repair     MELANOMA EXCISION WITH SENTINEL LYMPH NODE BIOPSY  03/18/2011   Back, nodes neg   OOPHORECTOMY     ROTATOR CUFF REPAIR     left   SHOULDER SURGERY     right and left    SKIN CANCER EXCISION     multiple   TOTAL HIP ARTHROPLASTY     TOTAL KNEE  ARTHROPLASTY     right 2010 and left in 2018    TOTAL KNEE ARTHROPLASTY Left 05/16/2017   Procedure: LEFT TOTAL KNEE ARTHROPLASTY;  Surgeon: Ernie Cough, MD;  Location: [facility] ORS;  Service: Orthopedics;  Laterality: Left;  70 mins   TUBAL LIGATION     TUMOR REMOVAL     ovary    Social History   Socioeconomic History   Marital status: Divorced    Spouse name: Not on file   Number of children: 3   Years of education: College   Highest education level: Some college, no degree  Occupational History   Occupation: Retired    Associate Professor: RETIRED  Tobacco Use   Smoking status: Never   Smokeless tobacco: Never  Vaping Use   Vaping status: Never Used  Substance and Sexual Activity   Alcohol use: Yes    Alcohol/week: 1.0 standard drink of alcohol    Types: 1 Cans of beer per week    Comment: mixed drink about two times a year   Drug use: No   Sexual activity: Not Currently  Other Topics Concern   Not on file  Social History Narrative   Patient lives at home alone in a one story home.   Has 3 children (2 daughters and 1 son)    Retired from [employer].   Divorced since 1992    Caffeine Use: 1 cup daily and a soft drink occasionally    No guns    Wears seat belt     Lives with cat    Left handed   Social Drivers of Health   Financial Resource Strain: Low Risk  (04/18/2023)   Overall Financial Resource Strain (CARDIA)    Difficulty of Paying Living Expenses: Not hard at all  Food Insecurity: No Food Insecurity (12/18/2023)   Hunger Vital Sign    Worried About Running Out of Food in the Last Year: Never true    Ran Out of Food in the Last Year: Never true  Transportation Needs: No Transportation Needs (12/18/2023)   PRAPARE - Transportation    Lack of Transportation (Medical): No    Lack  of Transportation (Non-Medical): No  Physical Activity: Inactive (04/18/2023)   Exercise Vital Sign    Days of Exercise per Week: 0 days    Minutes of Exercise per Session: 0 min   Stress: Stress Concern Present (04/18/2023)   [employer] of Occupational Health - Occupational Stress Questionnaire    Feeling of Stress : To some extent  Social Connections: Moderately Integrated (12/18/2023)   Social Connection and Isolation Panel    Frequency of Communication with Friends and Family: More than three times a week    Frequency of Social Gatherings with Friends and Family: Three times a week    Attends Religious Services: More than 4 times per year    Active Member of Clubs or Organizations: Yes    Attends Banker Meetings: More than 4 times per year    Marital Status: Divorced  Intimate Partner Violence: Not At Risk (12/18/2023)   Humiliation, Afraid, Rape, and Kick questionnaire    Fear of Current or Ex-Partner: No    Emotionally Abused: No    Physically Abused: No    Sexually Abused: No    Family History  Problem Relation Age of Onset   Hypertension Mother    Stroke Mother    Hyperlipidemia Mother    Heart disease Father    Diabetes Father    Hypertension Father    Hyperlipidemia Father    Early death Father    Diabetes Sister    Heart disease Sister        heart murmur   Arthritis Sister    COPD Sister    Hyperlipidemia Sister    Hypertension Sister    Colon polyps Sister        adenomous   Arthritis Sister    Heart disease Sister    Hyperlipidemia Sister    Hypertension Sister    Cancer Sister    Dementia Sister    Tremor Sister    Stroke Maternal Grandmother    Colon cancer Maternal Grandfather    Cancer Maternal Grandfather    Early death Maternal Grandfather    Early death Paternal Grandmother    Stroke Paternal Grandmother    Heart disease Daughter    Hyperlipidemia Daughter    Hypertension Daughter    Heart disease Son    Hyperlipidemia Son    Alcohol abuse Son    Depression Son    Diabetes Son    Pancreatic cancer Maternal Uncle    Alzheimer's disease Paternal Aunt    Alzheimer's disease Paternal Aunt     Alzheimer's disease Paternal Aunt    Alzheimer's disease Paternal Aunt    Cancer Other        2 paternal uncles and 1 maternal uncle colon cancer   Esophageal cancer Neg Hx    Stomach cancer Neg Hx     Allergies  Allergen Reactions   Epinephrine  Other (See Comments) and Anaphylaxis    Heart palpitations   Other Anaphylaxis    Black pepper- caused bronchial tubes to start closing    Penicillins Swelling and Other (See Comments)    Tolerated rocephin  within past year Lip swelling Has patient had a PCN reaction causing immediate rash, facial/tongue/throat swelling, SOB or lightheadedness with hypotension: Yes Has patient had a PCN reaction causing severe rash involving mucus membranes or skin necrosis: No Has patient had a PCN reaction that required hospitalization No Has patient had a PCN reaction occurring within the last 10 years: No If all of the above answers  are NO, then may proceed with Cephalosporin use.   Valium [Diazepam] Other (See Comments)    Other reaction(s): Other (See Comments) Overly sensitive  sedation    Outpatient Medications Prior to Visit  Medication Sig   albuterol  (PROAIR  HFA) 108 (90 Base) MCG/ACT inhaler Inhale 1-2 puffs into the lungs every 6 (six) hours as needed for wheezing or shortness of breath.   aspirin  81 MG tablet TAKE 1 TABLET BY MOUTH EVERY DAY   atorvastatin  (LIPITOR) 20 MG tablet Take 1 tablet (20 mg total) by mouth daily.   Biotin 1000 MCG tablet Take 1,000 mcg by mouth daily.   DULoxetine  (CYMBALTA ) 60 MG capsule Take 1 capsule (60 mg total) by mouth daily.   levothyroxine  (SYNTHROID ) 75 MCG tablet Take 1 tablet (75 mcg total) by mouth daily before breakfast.   Magnesium  250 MG TABS Take by mouth.   olmesartan  (BENICAR ) 5 MG tablet Take 2 tablets (10 mg total) by mouth 2 (two) times daily.   Vonoprazan Fumarate  (VOQUEZNA ) 20 MG TABS Take 1 tablet by mouth daily.   BREO ELLIPTA  100-25 MCG/ACT AEPB Inhale 1 puff into the lungs daily.  Inhale 1 puff by mouth once daily (Patient not taking: Reported on 01/22/2024)   Calcium  600-200 MG-UNIT tablet Take 1 tablet daily by mouth. (Patient not taking: Reported on 01/22/2024)   Cholecalciferol  (VITAMIN D3) 2000 UNITS capsule Take 2,000 Units by mouth daily. (Patient not taking: Reported on 01/22/2024)   cholestyramine  (QUESTRAN ) 4 g packet Take 4 g by mouth 2 (two) times daily as needed. (Patient not taking: Reported on 01/22/2024)   FOLIC ACID  PO Take by mouth daily. (Patient not taking: Reported on 01/22/2024)   Omega-3 Fatty Acids (FISH OIL) 1000 MG CAPS Take 1,000 mg by mouth daily.  (Patient not taking: Reported on 01/22/2024)   vitamin B-12 (CYANOCOBALAMIN ) 1000 MCG tablet Take 2,000 mcg by mouth daily. 2 tabs (Patient not taking: Reported on 01/22/2024)   No facility-administered medications prior to visit.    Review of Systems  Constitutional: Negative.  Negative for chills and fever.  HENT: Negative.  Negative for congestion, ear discharge, ear pain, hearing loss, nosebleeds and tinnitus.   Eyes: Negative.   Respiratory: Negative.  Negative for cough and shortness of breath.   Cardiovascular:  Negative for chest pain, palpitations and leg swelling.  Gastrointestinal: Negative.  Negative for abdominal pain, constipation, diarrhea, heartburn, nausea and vomiting.  Genitourinary: Negative.  Negative for dysuria and flank pain.  Musculoskeletal: Negative.  Negative for joint pain and myalgias.  Skin: Negative.   Neurological: Negative.  Negative for dizziness and headaches.  Endo/Heme/Allergies: Negative.   Psychiatric/Behavioral: Negative.  Negative for depression and suicidal ideas. The patient is not nervous/anxious.        Objective:   BP 130/70   Pulse 71   Ht 5' 6 (1.676 m)   Wt 185 lb 12.8 oz (84.3 kg)   SpO2 96%   BMI 29.99 kg/m   Vitals:   01/22/24 1152  BP: 130/70  Pulse: 71  Height: 5' 6 (1.676 m)  Weight: 185 lb 12.8 oz (84.3 kg)  SpO2: 96%  BMI  (Calculated): 30    Physical Exam Vitals and nursing note reviewed.  Constitutional:      Appearance: Normal appearance.  HENT:     Head: Normocephalic and atraumatic.     Nose: Nose normal.     Mouth/Throat:     Mouth: Mucous membranes are moist.     Pharynx: Oropharynx is  clear.  Eyes:     Conjunctiva/sclera: Conjunctivae normal.     Pupils: Pupils are equal, round, and reactive to light.  Cardiovascular:     Rate and Rhythm: Normal rate and regular rhythm.     Pulses: Normal pulses.     Heart sounds: Normal heart sounds. No murmur heard. Pulmonary:     Effort: Pulmonary effort is normal.     Breath sounds: Normal breath sounds. No wheezing.  Abdominal:     General: Bowel sounds are normal.     Palpations: Abdomen is soft.     Tenderness: There is no abdominal tenderness. There is no right CVA tenderness or left CVA tenderness.  Musculoskeletal:        General: Normal range of motion.     Cervical back: Normal range of motion.     Right lower leg: No edema.     Left lower leg: No edema.  Skin:    General: Skin is warm and dry.  Neurological:     General: No focal deficit present.     Mental Status: She is alert and oriented to person, place, and time.  Psychiatric:        Mood and Affect: Mood normal.        Behavior: Behavior normal.      No results found for any visits on 01/22/24.  Recent Results (from the past 2160 hours)  POCT Urinalysis Dipstick (18997)     Status: None   Collection Time: 10/30/23  1:58 PM  Result Value Ref Range   Color, UA Yellow    Clarity, UA Clear    Glucose, UA Negative Negative   Bilirubin, UA Negative    Ketones, UA Trace    Spec Grav, UA 1.015 1.010 - 1.025   Blood, UA Negative    pH, UA 5.5 5.0 - 8.0   Protein, UA Negative Negative   Urobilinogen, UA 0.2 0.2 or 1.0 E.U./dL   Nitrite, UA Negative    Leukocytes, UA Negative Negative   Appearance Clear    Odor No   POCT Urinalysis Dipstick (18997)     Status: Abnormal    Collection Time: 11/07/23 11:56 AM  Result Value Ref Range   Color, UA Orange    Clarity, UA Clear    Glucose, UA Negative Negative   Bilirubin, UA Negative    Ketones, UA Negative    Spec Grav, UA 1.015 1.010 - 1.025   Blood, UA Negative    pH, UA 5.5 5.0 - 8.0   Protein, UA Negative Negative   Urobilinogen, UA 0.2 0.2 or 1.0 E.U./dL   Nitrite, UA Negative    Leukocytes, UA Small (1+) (A) Negative   Appearance Clear    Odor Yes   CBC     Status: None   Collection Time: 12/18/23  9:37 AM  Result Value Ref Range   WBC 5.7 4.0 - 10.5 K/uL   RBC 4.58 3.87 - 5.11 MIL/uL   Hemoglobin 13.3 12.0 - 15.0 g/dL   HCT 59.6 63.9 - 53.9 %   MCV 88.0 80.0 - 100.0 fL   MCH 29.0 26.0 - 34.0 pg   MCHC 33.0 30.0 - 36.0 g/dL   RDW 86.6 88.4 - 84.4 %   Platelets 242 150 - 400 K/uL   nRBC 0.0 0.0 - 0.2 %    Comment: Performed at [facility], [address], [place], [place] [address]  Comprehensive metabolic panel     Status: Abnormal  Collection Time: 12/18/23  9:37 AM  Result Value Ref Range   Sodium 137 135 - 145 mmol/L   Potassium 4.6 3.5 - 5.1 mmol/L   Chloride 106 98 - 111 mmol/L   CO2 20 (L) 22 - 32 mmol/L   Glucose, Bld 128 (H) 70 - 99 mg/dL    Comment: Glucose reference range applies only to samples taken after fasting for at least 8 hours.   BUN 25 (H) 8 - 23 mg/dL   Creatinine, Ser 8.58 (H) 0.44 - 1.00 mg/dL   Calcium  9.2 8.9 - 10.3 mg/dL   Total Protein 7.1 6.5 - 8.1 g/dL   Albumin 4.0 3.5 - 5.0 g/dL   AST 21 15 - 41 U/L   ALT 20 0 - 44 U/L   Alkaline Phosphatase 71 38 - 126 U/L   Total Bilirubin 1.5 (H) 0.0 - 1.2 mg/dL   GFR, Estimated 37 (L) >60 mL/min    Comment: (NOTE) Calculated using the CKD-EPI Creatinine Equation (2021)    Anion gap 11 5 - 15    Comment: Performed at [facility], [address] [place], [place] [address]  Lactic acid, plasma     Status: Abnormal   Collection Time: 12/18/23  9:37 AM  Result Value Ref Range   Lactic Acid,  Venous 2.8 (HH) 0.5 - 1.9 mmol/L    Comment: CRITICAL RESULT CALLED TO, READ BACK BY AND VERIFIED WITH ASHLEY GILMORE 12/18/23 1022 MW Performed at [facility], [address] [place], [place] [address]   Blood Culture (routine x 2)     Status: None   Collection Time: 12/18/23 10:33 AM   Specimen: BLOOD  Result Value Ref Range   Specimen Description BLOOD BLOOD RIGHT ARM    Special Requests      BOTTLES DRAWN AEROBIC AND ANAEROBIC Blood Culture adequate volume   Culture      NO GROWTH 5 DAYS Performed at [facility], [address] [place], [place] [address]    Report Status 12/23/2023 FINAL   Lactic acid, plasma     Status: None   Collection Time: 12/18/23 12:07 PM  Result Value Ref Range   Lactic Acid, Venous 1.5 0.5 - 1.9 mmol/L    Comment: Performed at [facility], [address] [place], [place] [address]  Blood Culture (routine x 2)     Status: None   Collection Time: 12/18/23 12:07 PM   Specimen: BLOOD  Result Value Ref Range   Specimen Description BLOOD BLOOD LEFT HAND    Special Requests      BOTTLES DRAWN AEROBIC AND ANAEROBIC Blood Culture adequate volume   Culture      NO GROWTH 5 DAYS Performed at [facility], [address] [place], [place] [address]    Report Status 12/23/2023 FINAL   Urinalysis, w/ Reflex to Culture (Infection Suspected) -Urine, Clean Catch     Status: Abnormal   Collection Time: 12/18/23 12:19 PM  Result Value Ref Range   Specimen Source URINE, CLEAN CATCH    Color, Urine YELLOW YELLOW   APPearance CLEAR CLEAR   Specific Gravity, Urine >1.030 (H) 1.005 - 1.030   pH 5.5 5.0 - 8.0   Glucose, UA NEGATIVE NEGATIVE mg/dL   Hgb urine dipstick NEGATIVE NEGATIVE   Bilirubin Urine SMALL (A) NEGATIVE   Ketones, ur NEGATIVE NEGATIVE mg/dL   Protein, ur NEGATIVE NEGATIVE mg/dL   Nitrite NEGATIVE NEGATIVE   Leukocytes,Ua SMALL (A) NEGATIVE   Squamous  Epithelial / HPF 0-5 0 - 5 /HPF   WBC, UA 0-5 0 - 5  WBC/hpf    Comment: Reflex urine culture not performed if WBC <=10, OR if Squamous epithelial cells >5. If Squamous epithelial cells >5, suggest recollection.   RBC / HPF 0-5 0 - 5 RBC/hpf   Bacteria, UA NONE SEEN NONE SEEN    Comment: Performed at [facility], [address] [place], [place] [address]  Resp panel by RT-PCR (RSV, Flu A&B, Covid) Anterior Nasal Swab     Status: None   Collection Time: 12/18/23  1:03 PM   Specimen: Anterior Nasal Swab  Result Value Ref Range   SARS Coronavirus 2 by RT PCR NEGATIVE NEGATIVE    Comment: (NOTE) SARS-CoV-2 target nucleic acids are NOT DETECTED.  The SARS-CoV-2 RNA is generally detectable in upper respiratory specimens during the acute phase of infection. The lowest concentration of SARS-CoV-2 viral copies this assay can detect is 138 copies/mL. A negative result does not preclude SARS-Cov-2 infection and should not be used as the sole basis for treatment or other patient management decisions. A negative result may occur with  improper specimen collection/handling, submission of specimen other than nasopharyngeal swab, presence of viral mutation(s) within the areas targeted by this assay, and inadequate number of viral copies(<138 copies/mL). A negative result must be combined with clinical observations, patient history, and epidemiological information. The expected result is Negative.  Fact Sheet for Patients:  BloggerCourse.com  Fact Sheet for Healthcare Providers:  SeriousBroker.it  This test is no t yet approved or cleared by the [place]  FDA and  has been authorized for detection and/or diagnosis of SARS-CoV-2 by FDA under an Emergency Use Authorization (EUA). This EUA will remain  in effect (meaning this test can be used) for the duration of the COVID-19 declaration under Section 564(b)(1) of the Act, 21 U.S.C.section 360bbb-3(b)(1), unless the authorization is  terminated  or revoked sooner.       Influenza A by PCR NEGATIVE NEGATIVE   Influenza B by PCR NEGATIVE NEGATIVE    Comment: (NOTE) The Xpert Xpress SARS-CoV-2/FLU/RSV plus assay is intended as an aid in the diagnosis of influenza from Nasopharyngeal swab specimens and should not be used as a sole basis for treatment. Nasal washings and aspirates are unacceptable for Xpert Xpress SARS-CoV-2/FLU/RSV testing.  Fact Sheet for Patients: BloggerCourse.com  Fact Sheet for Healthcare Providers: SeriousBroker.it  This test is not yet approved or cleared by the [place]  FDA and has been authorized for detection and/or diagnosis of SARS-CoV-2 by FDA under an Emergency Use Authorization (EUA). This EUA will remain in effect (meaning this test can be used) for the duration of the COVID-19 declaration under Section 564(b)(1) of the Act, 21 U.S.C. section 360bbb-3(b)(1), unless the authorization is terminated or revoked.     Resp Syncytial Virus by PCR NEGATIVE NEGATIVE    Comment: (NOTE) Fact Sheet for Patients: BloggerCourse.com  Fact Sheet for Healthcare Providers: SeriousBroker.it  This test is not yet approved or cleared by the [place]  FDA and has been authorized for detection and/or diagnosis of SARS-CoV-2 by FDA under an Emergency Use Authorization (EUA). This EUA will remain in effect (meaning this test can be used) for the duration of the COVID-19 declaration under Section 564(b)(1) of the Act, 21 U.S.C. section 360bbb-3(b)(1), unless the authorization is terminated or revoked.  Performed at [facility], [address], [place], [place] [address]   Basic metabolic panel     Status:  Abnormal   Collection Time: 12/19/23  3:07 AM  Result Value Ref Range   Sodium 137 135 - 145 mmol/L   Potassium 4.9 3.5 - 5.1 mmol/L   Chloride 109 98 - 111 mmol/L   CO2  22 22 - 32 mmol/L   Glucose, Bld 111 (H) 70 - 99 mg/dL    Comment: Glucose reference range applies only to samples taken after fasting for at least 8 hours.   BUN 27 (H) 8 - 23 mg/dL   Creatinine, Ser 8.80 (H) 0.44 - 1.00 mg/dL   Calcium  8.5 (L) 8.9 - 10.3 mg/dL   GFR, Estimated 45 (L) >60 mL/min    Comment: (NOTE) Calculated using the CKD-EPI Creatinine Equation (2021)    Anion gap 6 5 - 15    Comment: Performed at [facility], [address], [place], [place] [address]      Assessment & Plan:  Patient advised to continue all her medications as such.  She did not bring her blood pressure monitor for calibration, but says that she will take it to her cardiology appointment. Problem List Items Addressed This Visit     Hypothyroidism   Relevant Orders   TSH+T4F+T3Free   Hyperlipidemia   Relevant Orders   Lipid Panel w/o Chol/HDL Ratio   Essential hypertension, benign - Primary   Relevant Orders   CMP14+EGFR   Stasis dermatitis of both legs   Vitamin B12 deficiency   GAD (generalized anxiety disorder)    Return in about 3 months (around 04/23/2024).   Total time spent: 30 minutes  FERNAND FREDY RAMAN, MD  01/22/2024   This document may have been prepared by [facility] Voice Recognition software and as such may include unintentional dictation errors.

## 2024-01-23 ENCOUNTER — Other Ambulatory Visit

## 2024-01-23 DIAGNOSIS — E782 Mixed hyperlipidemia: Secondary | ICD-10-CM | POA: Diagnosis not present

## 2024-01-23 DIAGNOSIS — E038 Other specified hypothyroidism: Secondary | ICD-10-CM | POA: Diagnosis not present

## 2024-01-23 DIAGNOSIS — I10 Essential (primary) hypertension: Secondary | ICD-10-CM | POA: Diagnosis not present

## 2024-01-24 LAB — CMP14+EGFR
ALT: 14 IU/L (ref 0–32)
AST: 18 IU/L (ref 0–40)
Albumin: 4.1 g/dL (ref 3.7–4.7)
Alkaline Phosphatase: 81 IU/L (ref 44–121)
BUN/Creatinine Ratio: 20 (ref 12–28)
BUN: 19 mg/dL (ref 8–27)
Bilirubin Total: 0.9 mg/dL (ref 0.0–1.2)
CO2: 16 mmol/L — ABNORMAL LOW (ref 20–29)
Calcium: 8.8 mg/dL (ref 8.7–10.3)
Chloride: 106 mmol/L (ref 96–106)
Creatinine, Ser: 0.95 mg/dL (ref 0.57–1.00)
Globulin, Total: 1.9 g/dL (ref 1.5–4.5)
Glucose: 127 mg/dL — ABNORMAL HIGH (ref 70–99)
Potassium: 4.7 mmol/L (ref 3.5–5.2)
Sodium: 139 mmol/L (ref 134–144)
Total Protein: 6 g/dL (ref 6.0–8.5)
eGFR: 59 mL/min/1.73 — ABNORMAL LOW (ref >59)

## 2024-01-24 LAB — TSH+T4F+T3FREE
Free T4: 1.23 ng/dL (ref 0.82–1.77)
T3, Free: 2.8 pg/mL (ref 2.0–4.4)
TSH: 1.49 u[IU]/mL (ref 0.450–4.500)

## 2024-01-24 LAB — LIPID PANEL W/O CHOL/HDL RATIO
Cholesterol, Total: 145 mg/dL (ref 100–199)
HDL: 69 mg/dL (ref >39)
LDL Chol Calc (NIH): 62 mg/dL (ref 0–99)
Triglycerides: 71 mg/dL (ref 0–149)
VLDL Cholesterol Cal: 14 mg/dL (ref 5–40)

## 2024-01-25 ENCOUNTER — Telehealth: Payer: Self-pay | Admitting: Internal Medicine

## 2024-01-25 NOTE — Telephone Encounter (Signed)
 Patient left VM requesting her lab results. Please advise.

## 2024-01-26 ENCOUNTER — Ambulatory Visit: Attending: Student

## 2024-01-26 ENCOUNTER — Ambulatory Visit: Payer: Self-pay | Admitting: Cardiology

## 2024-01-26 DIAGNOSIS — R0609 Other forms of dyspnea: Secondary | ICD-10-CM | POA: Diagnosis not present

## 2024-01-26 LAB — ECHOCARDIOGRAM COMPLETE
AR max vel: 2.04 cm2
AV Area VTI: 2.02 cm2
AV Area mean vel: 2 cm2
AV Mean grad: 3 mmHg
AV Peak grad: 4.9 mmHg
Ao pk vel: 1.11 m/s
Area-P 1/2: 3.85 cm2
S' Lateral: 2.76 cm

## 2024-01-26 NOTE — Progress Notes (Signed)
Pt informed

## 2024-01-27 ENCOUNTER — Ambulatory Visit: Payer: Self-pay | Admitting: Student

## 2024-01-29 NOTE — Progress Notes (Signed)
 Left vm to call back

## 2024-01-30 ENCOUNTER — Encounter: Payer: Self-pay | Admitting: Internal Medicine

## 2024-01-30 ENCOUNTER — Ambulatory Visit: Payer: Self-pay | Admitting: Internal Medicine

## 2024-01-30 ENCOUNTER — Ambulatory Visit: Admitting: Cardiovascular Disease

## 2024-01-30 ENCOUNTER — Ambulatory Visit (INDEPENDENT_AMBULATORY_CARE_PROVIDER_SITE_OTHER): Admitting: Internal Medicine

## 2024-01-30 DIAGNOSIS — M545 Low back pain, unspecified: Secondary | ICD-10-CM

## 2024-01-30 LAB — POCT URINALYSIS DIPSTICK
Bilirubin, UA: NEGATIVE
Blood, UA: NEGATIVE
Glucose, UA: NEGATIVE
Ketones, UA: NEGATIVE
Nitrite, UA: NEGATIVE
Protein, UA: NEGATIVE
Spec Grav, UA: 1.02 (ref 1.010–1.025)
Urobilinogen, UA: 0.2 U/dL
pH, UA: 5.5 (ref 5.0–8.0)

## 2024-01-30 NOTE — Progress Notes (Signed)
 Patient notified

## 2024-01-30 NOTE — Progress Notes (Signed)
Urine dipstick unremarkable.

## 2024-01-31 NOTE — Progress Notes (Signed)
 Last read by Sherrilyn GORMAN Belinda Shawnee at 10:32PM on 01/30/2024.

## 2024-02-06 ENCOUNTER — Ambulatory Visit: Admitting: Internal Medicine

## 2024-02-15 ENCOUNTER — Telehealth: Payer: Self-pay

## 2024-02-15 DIAGNOSIS — C44629 Squamous cell carcinoma of skin of left upper limb, including shoulder: Secondary | ICD-10-CM | POA: Diagnosis not present

## 2024-02-15 DIAGNOSIS — Z85820 Personal history of malignant melanoma of skin: Secondary | ICD-10-CM | POA: Diagnosis not present

## 2024-02-15 DIAGNOSIS — Z87898 Personal history of other specified conditions: Secondary | ICD-10-CM | POA: Diagnosis not present

## 2024-02-15 DIAGNOSIS — L578 Other skin changes due to chronic exposure to nonionizing radiation: Secondary | ICD-10-CM | POA: Diagnosis not present

## 2024-02-15 DIAGNOSIS — D225 Melanocytic nevi of trunk: Secondary | ICD-10-CM | POA: Diagnosis not present

## 2024-02-15 DIAGNOSIS — D485 Neoplasm of uncertain behavior of skin: Secondary | ICD-10-CM | POA: Diagnosis not present

## 2024-02-15 DIAGNOSIS — Z85828 Personal history of other malignant neoplasm of skin: Secondary | ICD-10-CM | POA: Diagnosis not present

## 2024-02-15 DIAGNOSIS — L570 Actinic keratosis: Secondary | ICD-10-CM | POA: Diagnosis not present

## 2024-02-15 DIAGNOSIS — L821 Other seborrheic keratosis: Secondary | ICD-10-CM | POA: Diagnosis not present

## 2024-02-15 NOTE — Telephone Encounter (Signed)
 Pt called and left vm requesting a call back regarding if she needs to take rx potassium. In the hospital was taken off of potassium, and asked to check with Dr. Fernand if she needs to take potassium rx or if levels are okay without rx? Hasn't taken it since she went to hospital. Please advise

## 2024-02-21 ENCOUNTER — Other Ambulatory Visit: Payer: Self-pay

## 2024-02-21 DIAGNOSIS — E039 Hypothyroidism, unspecified: Secondary | ICD-10-CM

## 2024-02-21 MED ORDER — LEVOTHYROXINE SODIUM 75 MCG PO TABS
75.0000 ug | ORAL_TABLET | Freq: Every day | ORAL | 3 refills | Status: AC
Start: 2024-02-21 — End: ?

## 2024-02-29 NOTE — Progress Notes (Signed)
   02/29/2024  Patient ID: Sherrilyn GORMAN Hammock, female   DOB: January 30, 1941, 83 y.o.   MRN: [id number]  Pharmacy Quality Measure Review  This patient is appearing on a report for being at risk of failing the adherence measure for hypertension (ACEi/ARB) medications this calendar year.   Medication: Olmesartan  5mg  Last fill date: 02/21/24 for 30 day supply  Insurance report was not up to date. No action needed at this time.   Jon VEAR Lindau, PharmD Clinical Pharmacist [phone]

## 2024-03-01 DIAGNOSIS — C44629 Squamous cell carcinoma of skin of left upper limb, including shoulder: Secondary | ICD-10-CM | POA: Diagnosis not present

## 2024-03-05 ENCOUNTER — Other Ambulatory Visit: Payer: Self-pay

## 2024-03-05 DIAGNOSIS — E785 Hyperlipidemia, unspecified: Secondary | ICD-10-CM

## 2024-03-05 MED ORDER — ATORVASTATIN CALCIUM 20 MG PO TABS: 20.0000 mg | ORAL_TABLET | Freq: Every day | ORAL | 3 refills | Status: AC

## 2024-03-05 NOTE — Progress Notes (Signed)
   03/05/2024  Patient ID: [name], female   DOB: 1940/11/11, 83 y.o.   MRN: [id number]  Pharmacy Quality Measure Review  This patient is appearing on a report for being at risk of failing the adherence measure for cholesterol (statin) medications this calendar year.   Medication: Atorvastatin  20mg  Last fill date: 11/29/23 for 90 day supply  Will collaborate with provider to facilitate refill needs.  Jon VEAR Lindau, PharmD Clinical Pharmacist [phone]

## 2024-03-06 DIAGNOSIS — H43811 Vitreous degeneration, right eye: Secondary | ICD-10-CM | POA: Diagnosis not present

## 2024-03-06 DIAGNOSIS — H35372 Puckering of macula, left eye: Secondary | ICD-10-CM | POA: Diagnosis not present

## 2024-03-06 DIAGNOSIS — H25813 Combined forms of age-related cataract, bilateral: Secondary | ICD-10-CM | POA: Diagnosis not present

## 2024-03-13 ENCOUNTER — Encounter: Payer: Self-pay | Admitting: Gastroenterology

## 2024-03-20 ENCOUNTER — Telehealth (INDEPENDENT_AMBULATORY_CARE_PROVIDER_SITE_OTHER): Payer: Self-pay

## 2024-03-20 NOTE — Telephone Encounter (Signed)
 Return patient call and left a message on the voicemail to return the call to the office.

## 2024-03-20 NOTE — Telephone Encounter (Signed)
 It is very likely nerve damage.  Typically vascular pain isn't in one localized area.  I would recommend following dermatologist recommendations and continue to follow with PCP

## 2024-03-20 NOTE — Telephone Encounter (Signed)
 Patient reach out to the office stating that for the past 2 to 3 years she has been having pain, burning, and aching on left leg in the area where she had skin cancer. Patient was recommended a debridement by the dermatologist but at the time she was not able to moved forward. Patient has reach out to her PCP and dermatologist for the symptoms. Patient states that dermatologist advised that it was nerve damage that she was experiencing. Please Advise on vascular standpoint.

## 2024-03-21 NOTE — Telephone Encounter (Signed)
Left a detailed message on the patient voicemail with medical recommendations

## 2024-03-28 DIAGNOSIS — H25813 Combined forms of age-related cataract, bilateral: Secondary | ICD-10-CM | POA: Diagnosis not present

## 2024-03-28 DIAGNOSIS — H43811 Vitreous degeneration, right eye: Secondary | ICD-10-CM | POA: Diagnosis not present

## 2024-03-28 DIAGNOSIS — D0462 Carcinoma in situ of skin of left upper limb, including shoulder: Secondary | ICD-10-CM | POA: Diagnosis not present

## 2024-03-28 DIAGNOSIS — H04123 Dry eye syndrome of bilateral lacrimal glands: Secondary | ICD-10-CM | POA: Diagnosis not present

## 2024-03-28 DIAGNOSIS — H35372 Puckering of macula, left eye: Secondary | ICD-10-CM | POA: Diagnosis not present

## 2024-03-28 DIAGNOSIS — H35363 Drusen (degenerative) of macula, bilateral: Secondary | ICD-10-CM | POA: Diagnosis not present

## 2024-03-28 DIAGNOSIS — D485 Neoplasm of uncertain behavior of skin: Secondary | ICD-10-CM | POA: Diagnosis not present

## 2024-04-04 DIAGNOSIS — D0462 Carcinoma in situ of skin of left upper limb, including shoulder: Secondary | ICD-10-CM | POA: Diagnosis not present

## 2024-04-04 DIAGNOSIS — T8131XA Disruption of external operation (surgical) wound, not elsewhere classified, initial encounter: Secondary | ICD-10-CM | POA: Diagnosis not present

## 2024-04-04 DIAGNOSIS — L570 Actinic keratosis: Secondary | ICD-10-CM | POA: Diagnosis not present

## 2024-04-08 NOTE — Progress Notes (Unsigned)
 Cardiology Office Note:    Date:  04/09/2024   ID:  [name], DOB June 27, 1941, MRN [id number]   Referring MD: Fernand Fredy GORMAN, MD   Chief Complaint  Patient presents with   Follow-up    Coronary artery disease of native artery of native heart with stable angina pectoris     History of Present Illness:    [name] is a 83 y.o. Female with hx of  CAD and aortic athero on CT scan Chronic dizziness Mild carotid dz Hyperlipidemia obesity, deconditioning Syncope Scarring in lung apices on CT Chronic SOB, COVID-02 February 2021 /mild reactive airways dysfunction syndrome  Who presents for  coronary disease, edema, chronic stable angina, shortness of breath, pulmonary fibrosis  LOV 6/24 In follow-up today reports doing well Leg pain at site of skin cancer wound surgical sites Slow to heal, pain at night when she is sleeping  In the hospital for sepsis 6/25, [place] the [place] this [place], records reviewed  Some incontinence at night  Pain in the neck at times on right  Prior cardiac imaging reviewed Echo July 2025 for shortness of breath EF 55 to 60%, normal RV size and function, no significant valvular heart disease  chronic shortness of breath,  lightheaded, symptoms stable sedentary,  History of gait instability, leg weakness, no falls, uses a cane, balance is poor  Uses lymphedema compression pumps periodically  labs reviewed LDL 62, total chol 145 A1C 6.3  EKG personally reviewed by myself on todays visit EKG Interpretation Date/Time:  Tuesday April 09 2024 16:09:12 EDT Ventricular Rate:  75 PR Interval:  150 QRS Duration:  62 QT Interval:  368 QTC Calculation: 410 R Axis:   0  Text Interpretation: Normal sinus rhythm When compared with ECG of 11-[name]-2024 13:36, No significant change was found Confirmed by Perla Lye [phone]) on 04/09/2024 4:13:22 PM    Prior symptoms shortness of breath Stress test 02/2020 Normal echo 12/2019  CT scan in  July 2020 pulm scarring fibrosis in the apices Followed by pulmonary,  airway reactivity  CT chest 01/2019 Aortic Atherosclerosis  Diffuse coronary artery calcifications, particularly dense within the left anterior descending coronary artery   Echocardiogram performed January 07, 2020 Normal ejection fraction, normal right heart pressures, normal RV function Mild to moderately TR  Echo in 2017: normal EF, normal RVSP  CT ABD: mild A athero There is 3 vessel coronary calcification  2017 and 2018    Past Medical History:  Diagnosis Date   Acid reflux    Allergy    Anemia    Anxiety    in past   Arthritis    all over   Asthma    Atypical moles    Bronchitis    Bronchospasm    reactive with perfume/cologne and smoke   CAD (coronary artery disease)    patient denies on preop of 10/24    Candida esophagitis (HCC)    Carotid artery calcification, bilateral 04/2016   Carpal tunnel syndrome of left wrist 12/03/2019   Cataract    Cervical stenosis of spine    Chronic insomnia    Complication of anesthesia    shoulder surgery- not all the way asleep   COVID-19 02/04/2021   also 10/3 or 04/20/21 sxs started 04/05/21   Depression    in past   Diverticulitis    Diverticulosis    Dizziness    Elevated liver enzymes    Family history of adverse reaction to anesthesia  sister- has problems with nausea and vomiting    Fatigue    Fatty liver    Generalized headache    migraines   GERD (gastroesophageal reflux disease)    Gross hematuria 08/18/2022   Hereditary and idiopathic peripheral neuropathy 05/11/2015   History of chemotherapy    topical cream for h/o skin CA   History of kidney stones    History of neuropathy    FH Dr. Tobie neurology   History of vertigo    Hx of migraines    Hyperlipidemia    Hypothyroidism    Insomnia    Irritable bowel syndrome    LLQ abdominal pain    Melanoma (HCC) 2013   Melanoma (HCC)    x2 stg II melanoma   Neuropathy, peripheral     Peripheral neuropathy    Pneumonia    hx of x 2   PONV (postoperative nausea and vomiting)    Pre-diabetes    Retina disorder    left   SCC (squamous cell carcinoma)    skin    Skin cancer 07/17/2019   SCC left leg s/p mohs   SCC 05/01/18 left medial mid leg, right post mid leg 06/08/18, left mid well 04/10/19  Dr. Robinson derm, Dr. Bette Bon mohs       Stroke [facility])    ? TIA per Dr V per patient    Thyroid  disease    Urine incontinence    Viral URI with cough 06/09/2022   Vitamin D  deficiency    Weight gain     Past Surgical History:  Procedure Laterality Date   ABLATION Bilateral    Dr. Marea 12/2020 veins laser ablation of both great saphenous veins in a staged fashion.   BILATERAL SALPINGOOPHORECTOMY     ovarian cyst Dr. Lenon and Dr. Lindajean 2008   BREAST SURGERY     in 2014 bx breast calcifications   BREAST SURGERY     augmentation with saline Dr. Nelwyn Santa 1995    CHOLECYSTECTOMY N/A 01/28/2015   Procedure: LAPAROSCOPIC CHOLECYSTECTOMY WITH INTRAOPERATIVE CHOLANGIOGRAM;  Surgeon: Deward Null III, MD;  Location: [facility] OR;  Service: General;  Laterality: N/A;   CLOSED MANIPULATION SHOULDER     rt   COLONOSCOPY WITH PROPOFOL  N/A 11/20/2012   Procedure: COLONOSCOPY WITH PROPOFOL ;  Surgeon: Gladis MARLA Louder, MD;  Location: [facility] ENDOSCOPY;  Service: Endoscopy;  Laterality: N/A;   colonscopy     May 2014   FOOT BONE EXCISION     JOINT REPLACEMENT     RTK   KNEE ARTHROSCOPY     left knee miniscus tear repair     MELANOMA EXCISION WITH SENTINEL LYMPH NODE BIOPSY  03/18/2011   Back, nodes neg   OOPHORECTOMY     ROTATOR CUFF REPAIR     left   SHOULDER SURGERY     right and left    SKIN CANCER EXCISION     multiple   TOTAL HIP ARTHROPLASTY     TOTAL KNEE ARTHROPLASTY     right 2010 and left in 2018    TOTAL KNEE ARTHROPLASTY Left 05/16/2017   Procedure: LEFT TOTAL KNEE ARTHROPLASTY;  Surgeon: Ernie Cough, MD;  Location: [facility] ORS;  Service: Orthopedics;  Laterality:  Left;  70 mins   TUBAL LIGATION     TUMOR REMOVAL     ovary    Current Medications: Current Outpatient Medications on File Prior to Visit  Medication Sig Dispense Refill   albuterol  (PROAIR  HFA) 108 (90  Base) MCG/ACT inhaler Inhale 1-2 puffs into the lungs every 6 (six) hours as needed for wheezing or shortness of breath. 18 g 11   aspirin  81 MG tablet TAKE 1 TABLET BY MOUTH EVERY DAY     atorvastatin  (LIPITOR) 20 MG tablet Take 1 tablet (20 mg total) by mouth daily. 90 tablet 3   Biotin 1000 MCG tablet Take 1,000 mcg by mouth daily.     BREO ELLIPTA  100-25 MCG/ACT AEPB Inhale 1 puff into the lungs daily. Inhale 1 puff by mouth once daily (Patient not taking: Reported on 01/22/2024) 60 each 3   Calcium  600-200 MG-UNIT tablet Take 1 tablet daily by mouth. (Patient not taking: Reported on 01/22/2024)     Cholecalciferol  (VITAMIN D3) 2000 UNITS capsule Take 2,000 Units by mouth daily. (Patient not taking: Reported on 01/22/2024)     cholestyramine  (QUESTRAN ) 4 g packet Take 4 g by mouth 2 (two) times daily as needed. (Patient not taking: Reported on 01/22/2024)     DULoxetine  (CYMBALTA ) 60 MG capsule Take 1 capsule (60 mg total) by mouth daily. 90 capsule 2   FOLIC ACID  PO Take by mouth daily. (Patient not taking: Reported on 01/22/2024)     levothyroxine  (SYNTHROID ) 75 MCG tablet Take 1 tablet (75 mcg total) by mouth daily before breakfast. 90 tablet 3   Magnesium  250 MG TABS Take by mouth.     olmesartan  (BENICAR ) 5 MG tablet Take 2 tablets (10 mg total) by mouth 2 (two) times daily. 120 tablet 6   Omega-3 Fatty Acids (FISH OIL) 1000 MG CAPS Take 1,000 mg by mouth daily.  (Patient not taking: Reported on 01/22/2024)     vitamin B-12 (CYANOCOBALAMIN ) 1000 MCG tablet Take 2,000 mcg by mouth daily. 2 tabs (Patient not taking: Reported on 01/22/2024)     Vonoprazan Fumarate  (VOQUEZNA ) 20 MG TABS Take 1 tablet by mouth daily. 30 tablet 3   No current facility-administered medications on file prior to visit.      Allergies:   Epinephrine , Other, Penicillins, and Valium [diazepam]   Social History   Socioeconomic History   Marital status: Divorced    Spouse name: Not on file   Number of children: 3   Years of education: College   Highest education level: Some college, no degree  Occupational History   Occupation: Retired    Associate Professor: RETIRED  Tobacco Use   Smoking status: Never   Smokeless tobacco: Never  Vaping Use   Vaping status: Never Used  Substance and Sexual Activity   Alcohol use: Yes    Alcohol/week: 1.0 standard drink of alcohol    Types: 1 Cans of beer per week    Comment: mixed drink about two times a year   Drug use: No   Sexual activity: Not Currently  Other Topics Concern   Not on file  Social History Narrative   Patient lives at home alone in a one story home.   Has 3 children (2 daughters and 1 son)    Retired from [employer].   Divorced since 1992    Caffeine Use: 1 cup daily and a soft drink occasionally    No guns    Wears seat belt     Lives with cat    Left handed   Social Drivers of Health   Financial Resource Strain: Low Risk  (04/18/2023)   Overall Financial Resource Strain (CARDIA)    Difficulty of Paying Living Expenses: Not hard at all  Food Insecurity: No  Food Insecurity (12/18/2023)   Hunger Vital Sign    Worried About Running Out of Food in the Last Year: Never true    Ran Out of Food in the Last Year: Never true  Transportation Needs: No Transportation Needs (12/18/2023)   PRAPARE - Administrator, Civil Service (Medical): No    Lack of Transportation (Non-Medical): No  Physical Activity: Inactive (04/18/2023)   Exercise Vital Sign    Days of Exercise per Week: 0 days    Minutes of Exercise per Session: 0 min  Stress: Stress Concern Present (04/18/2023)   [employer] of Occupational Health - Occupational Stress Questionnaire    Feeling of Stress : To some extent  Social Connections: Moderately Integrated  (12/18/2023)   Social Connection and Isolation Panel    Frequency of Communication with Friends and Family: More than three times a week    Frequency of Social Gatherings with Friends and Family: Three times a week    Attends Religious Services: More than 4 times per year    Active Member of Clubs or Organizations: Yes    Attends Engineer, structural: More than 4 times per year    Marital Status: Divorced     Family History: The patient's family history includes Alcohol abuse in her son; Alzheimer's disease in her paternal aunt, paternal aunt, paternal aunt, and paternal aunt; Arthritis in her sister and sister; COPD in her sister; Cancer in her maternal grandfather, sister, and another family member; Colon cancer in her maternal grandfather; Colon polyps in her sister; Dementia in her sister; Depression in her son; Diabetes in her father, sister, and son; Early death in her father, maternal grandfather, and paternal grandmother; Heart disease in her daughter, father, sister, sister, and son; Hyperlipidemia in her daughter, father, mother, sister, sister, and son; Hypertension in her daughter, father, mother, sister, and sister; Pancreatic cancer in her maternal uncle; Stroke in her maternal grandmother, mother, and paternal grandmother; Tremor in her sister. There is no history of Esophageal cancer or Stomach cancer.  ROS:   Please see the history of present illness.    Review of Systems  Constitutional: Negative.   HENT: Negative.    Respiratory: Negative.    Cardiovascular: Negative.   Gastrointestinal: Negative.   Musculoskeletal: Negative.   Neurological: Negative.   Psychiatric/Behavioral: Negative.    All other systems reviewed and are negative.   EKGs/Labs/Other Studies Reviewed:    The following studies were reviewed today:  Echo 12/2019 1. Left ventricular ejection fraction, by estimation, is 60 to 65%. The  left ventricle has normal function. The left ventricle has  no regional  wall motion abnormalities. Left ventricular diastolic parameters are  consistent with age-related delayed  relaxation (normal).   2. Right ventricular systolic function is normal. The right ventricular  size is normal. There is normal pulmonary artery systolic pressure.   3. The mitral valve is degenerative. Mild mitral valve regurgitation. No  evidence of mitral stenosis.   4. Tricuspid valve regurgitation is mild to moderate.   5. The aortic valve is tricuspid. Aortic valve regurgitation is not  visualized. No aortic stenosis is present.   6. The inferior vena cava is normal in size with greater than 50%  respiratory variability, suggesting right atrial pressure of 3 mmHg.    Recent Labs: 04/24/2023: Magnesium  2.0 12/18/2023: Hemoglobin 13.3; Platelets 242 01/23/2024: ALT 14; BUN 19; Creatinine, Ser 0.95; Potassium 4.7; Sodium 139; TSH 1.490  Recent Lipid Panel  Component Value Date/Time   CHOL 145 01/23/2024 0921   TRIG 71 01/23/2024 0921   HDL 69 01/23/2024 0921   CHOLHDL 2 08/22/2022 1442   VLDL 45.2 (H) 08/22/2022 1442   LDLCALC 62 01/23/2024 0921   LDLDIRECT 67.0 08/22/2022 1442     Physical Exam:    VS:  BP 116/68 (BP Location: Left Arm, Patient Position: Sitting, Cuff Size: Normal)   Pulse 75   Ht 5' 6 (1.676 m)   Wt 185 lb (83.9 kg)   SpO2 96%   BMI 29.86 kg/m     Wt Readings from Last 3 Encounters:  04/09/24 185 lb (83.9 kg)  01/22/24 185 lb 12.8 oz (84.3 kg)  01/11/24 184 lb (83.5 kg)    Constitutional:  oriented to person, place, and time. No distress.  HENT:  Head: Normocephalic and atraumatic.  Eyes:  no discharge. No scleral icterus.  Neck: Normal range of motion. Neck supple. No JVD present.  Cardiovascular: Normal rate, regular rhythm, normal heart sounds and intact distal pulses. Exam reveals no gallop and no friction rub. No edema No murmur heard. Pulmonary/Chest: Effort normal and breath sounds normal. No stridor. No respiratory  distress.  no wheezes.  no rales.  no tenderness.  Abdominal: Soft.  no distension.  no tenderness.  Musculoskeletal: Normal range of motion.  no  tenderness or deformity.  Neurological:  normal muscle tone. Coordination normal. No atrophy Skin: Skin is warm and dry. No rash noted. not diaphoretic.  Psychiatric:  normal mood and affect. behavior is normal. Thought content normal.     ASSESSMENT:    1. PAD (peripheral artery disease)   2. Aortic atherosclerosis   3. Coronary artery calcification   4. Primary hypertension   5. DOE (dyspnea on exertion)   6. Palpitations   7. Hyperlipidemia LDL goal <70   8. Cerebrovascular accident (CVA), unspecified mechanism (HCC)   9. Bilateral carotid artery stenosis     PLAN:    Chronic shortness of breath Deconditioning, leg weakness contributing Prior cardiac workup unrevealing stress test with no significant ischemia Echocardiogram normal ejection fraction EKG normal, - Recommended regular walking program for conditioning  CAD with chronic stable angina Currently with no symptoms of angina. No further workup at this time. Continue current medication regimen. Cholesterol at goal  Obesity Walking program recommended  Scarring of lung/shortness of breath Followed by pulmonary, on inhalers,  No additional cardiac testing needed  Leg swelling Amlodipine  previously held -No significant swelling on today's visit -Uses compression pumps periodically   Essential hypertension No ca channel blocker given propensity for leg swelling Blood pressure is well controlled on today's visit. No changes made to the medications.   Signed, Evalene Lunger, MD  04/09/2024 4:20 PM    [facility]

## 2024-04-09 ENCOUNTER — Encounter: Payer: Self-pay | Admitting: Cardiovascular Disease

## 2024-04-09 ENCOUNTER — Ambulatory Visit: Attending: Cardiovascular Disease | Admitting: Cardiovascular Disease

## 2024-04-09 VITALS — BP 116/68 | HR 75 | Ht 66.0 in | Wt 185.0 lb

## 2024-04-09 DIAGNOSIS — R002 Palpitations: Secondary | ICD-10-CM

## 2024-04-09 DIAGNOSIS — R0609 Other forms of dyspnea: Secondary | ICD-10-CM

## 2024-04-09 DIAGNOSIS — I639 Cerebral infarction, unspecified: Secondary | ICD-10-CM | POA: Diagnosis not present

## 2024-04-09 DIAGNOSIS — I2510 Atherosclerotic heart disease of native coronary artery without angina pectoris: Secondary | ICD-10-CM

## 2024-04-09 DIAGNOSIS — I739 Peripheral vascular disease, unspecified: Secondary | ICD-10-CM

## 2024-04-09 DIAGNOSIS — I6523 Occlusion and stenosis of bilateral carotid arteries: Secondary | ICD-10-CM

## 2024-04-09 DIAGNOSIS — I700 Atherosclerosis of aorta: Secondary | ICD-10-CM | POA: Diagnosis not present

## 2024-04-09 DIAGNOSIS — E785 Hyperlipidemia, unspecified: Secondary | ICD-10-CM

## 2024-04-09 DIAGNOSIS — I10 Essential (primary) hypertension: Secondary | ICD-10-CM

## 2024-04-09 NOTE — Patient Instructions (Addendum)

## 2024-04-22 ENCOUNTER — Other Ambulatory Visit: Payer: Self-pay | Admitting: Gastroenterology

## 2024-04-23 ENCOUNTER — Ambulatory Visit: Admitting: Internal Medicine

## 2024-04-23 ENCOUNTER — Telehealth: Payer: Self-pay

## 2024-04-23 NOTE — Telephone Encounter (Signed)
 Patient [name] yesterday around 434P stating that she had to cancel her appt with you this morning bc she broke her front teeth and had to be seen by the dentist, she states she's still having some burning and discomfort, do you want her to come in for a UA only?

## 2024-04-24 ENCOUNTER — Telehealth: Payer: Self-pay | Admitting: Gastroenterology

## 2024-04-24 DIAGNOSIS — K2210 Ulcer of esophagus without bleeding: Secondary | ICD-10-CM

## 2024-04-24 DIAGNOSIS — K449 Diaphragmatic hernia without obstruction or gangrene: Secondary | ICD-10-CM

## 2024-04-24 DIAGNOSIS — K2101 Gastro-esophageal reflux disease with esophagitis, with bleeding: Secondary | ICD-10-CM

## 2024-04-24 MED ORDER — VOQUEZNA 20 MG PO TABS: 1.0000 | ORAL_TABLET | Freq: Every day | ORAL | 3 refills | Status: DC

## 2024-04-24 NOTE — Telephone Encounter (Signed)
 PT is calling to have a refill for Voquezna  sent to [facility] on [address]. She only has one left. Please advise.

## 2024-04-30 ENCOUNTER — Telehealth: Payer: Self-pay | Admitting: Podiatry

## 2024-04-30 NOTE — Telephone Encounter (Signed)
 Pt wants  to ask u question about dermato ly you refereed her to

## 2024-05-02 DIAGNOSIS — J3801 Paralysis of vocal cords and larynx, unilateral: Secondary | ICD-10-CM | POA: Diagnosis not present

## 2024-05-02 DIAGNOSIS — R490 Dysphonia: Secondary | ICD-10-CM | POA: Diagnosis not present

## 2024-05-03 DIAGNOSIS — M7712 Lateral epicondylitis, left elbow: Secondary | ICD-10-CM | POA: Diagnosis not present

## 2024-05-03 DIAGNOSIS — N1831 Chronic kidney disease, stage 3a: Secondary | ICD-10-CM | POA: Diagnosis not present

## 2024-05-03 DIAGNOSIS — Z5189 Encounter for other specified aftercare: Secondary | ICD-10-CM | POA: Diagnosis not present

## 2024-05-07 ENCOUNTER — Other Ambulatory Visit: Payer: Self-pay | Admitting: Student

## 2024-05-07 DIAGNOSIS — R490 Dysphonia: Secondary | ICD-10-CM

## 2024-05-08 NOTE — Telephone Encounter (Signed)
 Patient requesting to speak with a nurse in regards to medication preferences as well as dose changes. Please advise.

## 2024-05-08 NOTE — Telephone Encounter (Signed)
 Patient states she has been on Voquezna  for reflux and feels it has been very helpful for her. In fact, she also felt it had been effective in helping her diarrhea symptoms.   Over the last few days, patient complains of stringy stools that are small in caliber. No diarrhea. State she is concerned she has a blockage. She admits she stopped taking fiber because she read that it makes you go to the bathroom more often. Patient denies any nausea, vomiting, rectal bleeding. Does complain of some abdominal pain on the sides.  Patient is advised that she should restart benefiber as this will help bulk the stools and she should continue voquezna .  Patient was to have return visit with B.McMichael, PA-C 4 months following her 11/2023 appointment but before scheduling this wants to see if [facility] would recommend her coming back to the office.

## 2024-05-09 ENCOUNTER — Telehealth: Payer: Self-pay

## 2024-05-09 NOTE — Telephone Encounter (Signed)
 Copied from CRM [id number]. Topic: Appointments - Scheduling Inquiry for Clinic >> May 09, 2024  4:31 PM [name] wrote: Reason for CRM: Seeking new provider in [place]. Bad experience with last 2, after Dr Randine. Will not be returning to current PCP after next appt.   Requesting [facility]. Explained they are not taking any new patients, but has requested special circumstances, advanced age, inability to drive further than [place].  5 years of Oncology, melanoma stage 3. sepsis in June.   Callback [phone]

## 2024-05-09 NOTE — Telephone Encounter (Signed)
 I have spoken to patient to advise of [facility]'s response/recommendations. She verbalizes understanding and will cal back if she has problems despite restarting fiber.

## 2024-05-10 ENCOUNTER — Ambulatory Visit (INDEPENDENT_AMBULATORY_CARE_PROVIDER_SITE_OTHER): Admitting: Internal Medicine

## 2024-05-10 ENCOUNTER — Encounter: Payer: Self-pay | Admitting: Internal Medicine

## 2024-05-10 VITALS — BP 124/84 | HR 69 | Ht 66.0 in | Wt 186.8 lb

## 2024-05-10 DIAGNOSIS — E782 Mixed hyperlipidemia: Secondary | ICD-10-CM

## 2024-05-10 DIAGNOSIS — J84112 Idiopathic pulmonary fibrosis: Secondary | ICD-10-CM | POA: Diagnosis not present

## 2024-05-10 DIAGNOSIS — N1831 Chronic kidney disease, stage 3a: Secondary | ICD-10-CM | POA: Diagnosis not present

## 2024-05-10 DIAGNOSIS — E038 Other specified hypothyroidism: Secondary | ICD-10-CM

## 2024-05-10 DIAGNOSIS — E66811 Obesity, class 1: Secondary | ICD-10-CM

## 2024-05-10 DIAGNOSIS — I10 Essential (primary) hypertension: Secondary | ICD-10-CM | POA: Diagnosis not present

## 2024-05-10 DIAGNOSIS — R7303 Prediabetes: Secondary | ICD-10-CM

## 2024-05-10 DIAGNOSIS — Z1231 Encounter for screening mammogram for malignant neoplasm of breast: Secondary | ICD-10-CM

## 2024-05-10 NOTE — Progress Notes (Signed)
 Established Patient Office Visit  Subjective:  Patient ID: [name], female    DOB: Dec 31, 1940  Age: 83 y.o. MRN: [id number]  Chief Complaint  Patient presents with   Follow-up    3 month follow up    Patient is here for follow up. She reports she is still having hoarseness and her ENT specialist Dr. Herminio has ordered CT throat that is scheduled for 05/13/24. She has GERD and is on Voquezna  which was controlling her symptoms until recently. She reports the last few days she is having nausea and changes in her bowel habits being smaller and greasy. Patient states GI encouraged her to add fiber supplementation daily.  Patient reports recent Dermatology appointment where she had squamous cell carcinoma and actinic keratosis removed. She is due for colonoscopy or cologuard. Her last colonoscopy was in 2014. She reported having cologuard box at home during 2020 visit but it has not resulted. She had DEXA scan completed 2025 and her last mammogram was in 02/2023. Will order.  Denies headache, chest pain, shortness of breath, abdominal pain, palpitations at this time.  Patient has already gotten her flu shot for the season.    No other concerns at this time.   Past Medical History:  Diagnosis Date   Acid reflux    Allergy    Anemia    Anxiety    in past   Arthritis    all over   Asthma    Atypical moles    Bronchitis    Bronchospasm    reactive with perfume/cologne and smoke   CAD (coronary artery disease)    patient denies on preop of 10/24    Candida esophagitis (HCC)    Carotid artery calcification, bilateral 04/2016   Carpal tunnel syndrome of left wrist 12/03/2019   Cataract    Cervical stenosis of spine    Chronic insomnia    Complication of anesthesia    shoulder surgery- not all the way asleep   COVID-19 02/04/2021   also 10/3 or 04/20/21 sxs started 04/05/21   Depression    in past   Diverticulitis    Diverticulosis    Dizziness    Elevated liver  enzymes    Family history of adverse reaction to anesthesia    sister- has problems with nausea and vomiting    Fatigue    Fatty liver    Generalized headache    migraines   GERD (gastroesophageal reflux disease)    Gross hematuria 08/18/2022   Hereditary and idiopathic peripheral neuropathy 05/11/2015   History of chemotherapy    topical cream for h/o skin CA   History of kidney stones    History of neuropathy    FH Dr. Tobie neurology   History of vertigo    Hx of migraines    Hyperlipidemia    Hypothyroidism    Insomnia    Irritable bowel syndrome    LLQ abdominal pain    Melanoma (HCC) 2013   Melanoma (HCC)    x2 stg II melanoma   Neuropathy, peripheral    Peripheral neuropathy    Pneumonia    hx of x 2   PONV (postoperative nausea and vomiting)    Pre-diabetes    Retina disorder    left   SCC (squamous cell carcinoma)    skin    Skin cancer 07/17/2019   SCC left leg s/p mohs   SCC 05/01/18 left medial mid leg, right post mid leg 06/08/18, left mid  well 04/10/19  Dr. Robinson derm, Dr. Bette Bon mohs       Stroke [facility])    ? TIA per Dr V per patient    Thyroid  disease    Urine incontinence    Viral URI with cough 06/09/2022   Vitamin D  deficiency    Weight gain     Past Surgical History:  Procedure Laterality Date   ABLATION Bilateral    Dr. Marea 12/2020 veins laser ablation of both great saphenous veins in a staged fashion.   BILATERAL SALPINGOOPHORECTOMY     ovarian cyst Dr. Lenon and Dr. Lindajean 2008   BREAST SURGERY     in 2014 bx breast calcifications   BREAST SURGERY     augmentation with saline Dr. Nelwyn Santa 1995    CHOLECYSTECTOMY N/A 01/28/2015   Procedure: LAPAROSCOPIC CHOLECYSTECTOMY WITH INTRAOPERATIVE CHOLANGIOGRAM;  Surgeon: Deward Null III, MD;  Location: [facility] OR;  Service: General;  Laterality: N/A;   CLOSED MANIPULATION SHOULDER     rt   COLONOSCOPY WITH PROPOFOL  N/A 11/20/2012   Procedure: COLONOSCOPY WITH PROPOFOL ;  Surgeon: Gladis MARLA Louder, MD;  Location: [facility] ENDOSCOPY;  Service: Endoscopy;  Laterality: N/A;   colonscopy     May 2014   FOOT BONE EXCISION     JOINT REPLACEMENT     RTK   KNEE ARTHROSCOPY     left knee miniscus tear repair     MELANOMA EXCISION WITH SENTINEL LYMPH NODE BIOPSY  03/18/2011   Back, nodes neg   OOPHORECTOMY     ROTATOR CUFF REPAIR     left   SHOULDER SURGERY     right and left    SKIN CANCER EXCISION     multiple   TOTAL HIP ARTHROPLASTY     TOTAL KNEE ARTHROPLASTY     right 2010 and left in 2018    TOTAL KNEE ARTHROPLASTY Left 05/16/2017   Procedure: LEFT TOTAL KNEE ARTHROPLASTY;  Surgeon: Ernie Cough, MD;  Location: [facility] ORS;  Service: Orthopedics;  Laterality: Left;  70 mins   TUBAL LIGATION     TUMOR REMOVAL     ovary    Social History   Socioeconomic History   Marital status: Divorced    Spouse name: Not on file   Number of children: 3   Years of education: College   Highest education level: Some college, no degree  Occupational History   Occupation: Retired    Associate Professor: RETIRED  Tobacco Use   Smoking status: Never   Smokeless tobacco: Never  Vaping Use   Vaping status: Never Used  Substance and Sexual Activity   Alcohol use: Yes    Alcohol/week: 1.0 standard drink of alcohol    Types: 1 Cans of beer per week    Comment: mixed drink about two times a year   Drug use: No   Sexual activity: Not Currently  Other Topics Concern   Not on file  Social History Narrative   Patient lives at home alone in a one story home.   Has 3 children (2 daughters and 1 son)    Retired from [employer].   Divorced since 1992    Caffeine Use: 1 cup daily and a soft drink occasionally    No guns    Wears seat belt     Lives with cat    Left handed   Social Drivers of Health   Financial Resource Strain: Low Risk  (04/18/2023)   Overall Financial Resource Strain (CARDIA)  Difficulty of Paying Living Expenses: Not hard at all  Food Insecurity: No Food  Insecurity (12/18/2023)   Hunger Vital Sign    Worried About Running Out of Food in the Last Year: Never true    Ran Out of Food in the Last Year: Never true  Transportation Needs: No Transportation Needs (12/18/2023)   PRAPARE - Administrator, Civil Service (Medical): No    Lack of Transportation (Non-Medical): No  Physical Activity: Inactive (04/18/2023)   Exercise Vital Sign    Days of Exercise per Week: 0 days    Minutes of Exercise per Session: 0 min  Stress: Stress Concern Present (04/18/2023)   [employer] of Occupational Health - Occupational Stress Questionnaire    Feeling of Stress : To some extent  Social Connections: Moderately Integrated (12/18/2023)   Social Connection and Isolation Panel    Frequency of Communication with Friends and Family: More than three times a week    Frequency of Social Gatherings with Friends and Family: Three times a week    Attends Religious Services: More than 4 times per year    Active Member of Clubs or Organizations: Yes    Attends Banker Meetings: More than 4 times per year    Marital Status: Divorced  Intimate Partner Violence: Not At Risk (12/18/2023)   Humiliation, Afraid, Rape, and Kick questionnaire    Fear of Current or Ex-Partner: No    Emotionally Abused: No    Physically Abused: No    Sexually Abused: No    Family History  Problem Relation Age of Onset   Hypertension Mother    Stroke Mother    Hyperlipidemia Mother    Heart disease Father    Diabetes Father    Hypertension Father    Hyperlipidemia Father    Early death Father    Diabetes Sister    Heart disease Sister        heart murmur   Arthritis Sister    COPD Sister    Hyperlipidemia Sister    Hypertension Sister    Colon polyps Sister        adenomous   Arthritis Sister    Heart disease Sister    Hyperlipidemia Sister    Hypertension Sister    Cancer Sister    Dementia Sister    Tremor Sister    Stroke Maternal Grandmother     Colon cancer Maternal Grandfather    Cancer Maternal Grandfather    Early death Maternal Grandfather    Early death Paternal Grandmother    Stroke Paternal Grandmother    Heart disease Daughter    Hyperlipidemia Daughter    Hypertension Daughter    Heart disease Son    Hyperlipidemia Son    Alcohol abuse Son    Depression Son    Diabetes Son    Pancreatic cancer Maternal Uncle    Alzheimer's disease Paternal Aunt    Alzheimer's disease Paternal Aunt    Alzheimer's disease Paternal Aunt    Alzheimer's disease Paternal Aunt    Cancer Other        2 paternal uncles and 1 maternal uncle colon cancer   Esophageal cancer Neg Hx    Stomach cancer Neg Hx     Allergies  Allergen Reactions   Epinephrine  Other (See Comments) and Anaphylaxis    Heart palpitations   Other Anaphylaxis    Black pepper- caused bronchial tubes to start closing    Penicillins Swelling and Other (See  Comments)    Tolerated rocephin  within past year Lip swelling Has patient had a PCN reaction causing immediate rash, facial/tongue/throat swelling, SOB or lightheadedness with hypotension: Yes Has patient had a PCN reaction causing severe rash involving mucus membranes or skin necrosis: No Has patient had a PCN reaction that required hospitalization No Has patient had a PCN reaction occurring within the last 10 years: No If all of the above answers are NO, then may proceed with Cephalosporin use.   Valium [Diazepam] Other (See Comments)    Other reaction(s): Other (See Comments) Overly sensitive  sedation    Outpatient Medications Prior to Visit  Medication Sig   albuterol  (PROAIR  HFA) 108 (90 Base) MCG/ACT inhaler Inhale 1-2 puffs into the lungs every 6 (six) hours as needed for wheezing or shortness of breath.   aspirin  81 MG tablet TAKE 1 TABLET BY MOUTH EVERY DAY   atorvastatin  (LIPITOR) 20 MG tablet Take 1 tablet (20 mg total) by mouth daily.   Biotin 1000 MCG tablet Take 1,000 mcg by mouth daily.    BREO ELLIPTA  100-25 MCG/ACT AEPB Inhale 1 puff into the lungs daily. Inhale 1 puff by mouth once daily   Calcium  600-200 MG-UNIT tablet Take 1 tablet daily by mouth.   Cholecalciferol  (VITAMIN D3) 2000 UNITS capsule Take 2,000 Units by mouth daily.   DULoxetine  (CYMBALTA ) 60 MG capsule Take 1 capsule (60 mg total) by mouth daily.   FOLIC ACID  PO Take by mouth daily.   levothyroxine  (SYNTHROID ) 75 MCG tablet Take 1 tablet (75 mcg total) by mouth daily before breakfast.   Magnesium  250 MG TABS Take by mouth.   olmesartan  (BENICAR ) 5 MG tablet Take 2 tablets (10 mg total) by mouth 2 (two) times daily.   Omega-3 Fatty Acids (FISH OIL) 1000 MG CAPS Take 1,000 mg by mouth daily.    psyllium (METAMUCIL) 58.6 % packet Take 1 packet by mouth daily.   vitamin B-12 (CYANOCOBALAMIN ) 1000 MCG tablet Take 2,000 mcg by mouth daily. 2 tabs   Vonoprazan Fumarate  (VOQUEZNA ) 20 MG TABS Take 1 tablet by mouth daily.   cholestyramine  (QUESTRAN ) 4 g packet Take 4 g by mouth 2 (two) times daily as needed. (Patient not taking: Reported on 05/10/2024)   No facility-administered medications prior to visit.    Review of Systems  Constitutional: Negative.  Negative for chills, fever and malaise/fatigue.  HENT: Negative.  Negative for congestion and sore throat.   Eyes: Negative.  Negative for blurred vision and pain.  Respiratory: Negative.  Negative for cough and shortness of breath.   Cardiovascular: Negative.  Negative for chest pain, palpitations and leg swelling.  Gastrointestinal:  Positive for heartburn and nausea. Negative for abdominal pain, blood in stool, constipation, diarrhea, melena and vomiting.       Stool changes reportsed as smaller bowel movements that are greasy   Genitourinary: Negative.  Negative for dysuria, flank pain, frequency and urgency.  Musculoskeletal: Negative.  Negative for joint pain and myalgias.  Skin: Negative.   Neurological: Negative.  Negative for dizziness, tingling,  sensory change, weakness and headaches.  Endo/Heme/Allergies: Negative.   Psychiatric/Behavioral: Negative.  Negative for depression and suicidal ideas. The patient is not nervous/anxious.        Objective:   BP 124/84   Pulse 69   Ht 5' 6 (1.676 m)   Wt 186 lb 12.8 oz (84.7 kg)   SpO2 97%   BMI 30.15 kg/m   Vitals:   05/10/24 0943  BP: 124/84  Pulse: 69  Height: 5' 6 (1.676 m)  Weight: 186 lb 12.8 oz (84.7 kg)  SpO2: 97%  BMI (Calculated): 30.16    Physical Exam Vitals and nursing note reviewed.  Constitutional:      Appearance: Normal appearance.  HENT:     Head: Normocephalic and atraumatic.     Nose: Nose normal.     Mouth/Throat:     Mouth: Mucous membranes are moist.     Pharynx: Oropharynx is clear.  Eyes:     Conjunctiva/sclera: Conjunctivae normal.     Pupils: Pupils are equal, round, and reactive to light.  Cardiovascular:     Rate and Rhythm: Normal rate and regular rhythm.     Pulses: Normal pulses.     Heart sounds: Normal heart sounds. No murmur heard. Pulmonary:     Effort: Pulmonary effort is normal.     Breath sounds: Normal breath sounds. No wheezing.  Abdominal:     General: Bowel sounds are normal.     Palpations: Abdomen is soft.     Tenderness: There is no abdominal tenderness. There is no right CVA tenderness or left CVA tenderness.  Musculoskeletal:        General: Normal range of motion.     Cervical back: Normal range of motion.     Right lower leg: No edema.     Left lower leg: No edema.  Skin:    General: Skin is warm and dry.  Neurological:     General: No focal deficit present.     Mental Status: She is alert and oriented to person, place, and time.  Psychiatric:        Mood and Affect: Mood normal.        Behavior: Behavior normal.      No results found for any visits on 05/10/24.  No results found for this or any previous visit (from the past 2160 hours).    Assessment & Plan:  Check routine blood work today  and FU with patient on results. Mammogram ordered. Problem List Items Addressed This Visit     Hypothyroidism   Relevant Orders   TSH+T4F+T3Free   Hyperlipidemia   Relevant Orders   Lipid Panel w/o Chol/HDL Ratio   CBC with Diff   Prediabetes   Relevant Orders   Hemoglobin A1c   IPF (idiopathic pulmonary fibrosis) (HCC)   Essential hypertension, benign - Primary   Relevant Orders   CMP14+EGFR   CBC with Diff   Obesity, Class I, BMI 30-34.9   Morbid obesity (HCC)   Stage 3a chronic kidney disease (HCC)   Breast cancer screening by mammogram   Relevant Orders   MM 3D SCREENING MAMMOGRAM BILATERAL BREAST   Other Visit Diagnoses       Colon cancer screening           Return in about 3 months (around 08/10/2024).   Total time spent: 25 minutes. This time includes review of previous notes and results and patient face to face interaction during today's visit.    FERNAND FREDY RAMAN, MD  05/10/2024   This document may have been prepared by [facility] Voice Recognition software and as such may include unintentional dictation errors.

## 2024-05-10 NOTE — Telephone Encounter (Signed)
 Noted

## 2024-05-11 LAB — CBC WITH DIFFERENTIAL/PLATELET
Basophils Absolute: 0 x10E3/uL (ref 0.0–0.2)
Basos: 1 %
EOS (ABSOLUTE): 0.2 x10E3/uL (ref 0.0–0.4)
Eos: 3 %
Hematocrit: 36.8 % (ref 34.0–46.6)
Hemoglobin: 12.2 g/dL (ref 11.1–15.9)
Immature Grans (Abs): 0 x10E3/uL (ref 0.0–0.1)
Immature Granulocytes: 0 %
Lymphocytes Absolute: 1.4 x10E3/uL (ref 0.7–3.1)
Lymphs: 26 %
MCH: 29.3 pg (ref 26.6–33.0)
MCHC: 33.2 g/dL (ref 31.5–35.7)
MCV: 89 fL (ref 79–97)
Monocytes Absolute: 0.5 x10E3/uL (ref 0.1–0.9)
Monocytes: 10 %
Neutrophils Absolute: 3.2 x10E3/uL (ref 1.4–7.0)
Neutrophils: 60 %
Platelets: 231 x10E3/uL (ref 150–450)
RBC: 4.16 x10E6/uL (ref 3.77–5.28)
RDW: 12.5 % (ref 11.7–15.4)
WBC: 5.3 x10E3/uL (ref 3.4–10.8)

## 2024-05-11 LAB — TSH+T4F+T3FREE
Free T4: 1.45 ng/dL (ref 0.82–1.77)
T3, Free: 2.7 pg/mL (ref 2.0–4.4)
TSH: 1.84 u[IU]/mL (ref 0.450–4.500)

## 2024-05-11 LAB — CMP14+EGFR
ALT: 16 IU/L (ref 0–32)
AST: 17 IU/L (ref 0–40)
Albumin: 4.5 g/dL (ref 3.7–4.7)
Alkaline Phosphatase: 92 IU/L (ref 48–129)
BUN/Creatinine Ratio: 17 (ref 12–28)
BUN: 17 mg/dL (ref 8–27)
Bilirubin Total: 1.1 mg/dL (ref 0.0–1.2)
CO2: 23 mmol/L (ref 20–29)
Calcium: 9 mg/dL (ref 8.7–10.3)
Chloride: 104 mmol/L (ref 96–106)
Creatinine, Ser: 1.01 mg/dL — ABNORMAL HIGH (ref 0.57–1.00)
Globulin, Total: 2.1 g/dL (ref 1.5–4.5)
Glucose: 111 mg/dL — ABNORMAL HIGH (ref 70–99)
Potassium: 4.2 mmol/L (ref 3.5–5.2)
Sodium: 142 mmol/L (ref 134–144)
Total Protein: 6.6 g/dL (ref 6.0–8.5)
eGFR: 55 mL/min/1.73 — ABNORMAL LOW (ref >59)

## 2024-05-11 LAB — LIPID PANEL W/O CHOL/HDL RATIO
Cholesterol, Total: 144 mg/dL (ref 100–199)
HDL: 77 mg/dL (ref >39)
LDL Chol Calc (NIH): 51 mg/dL (ref 0–99)
Triglycerides: 83 mg/dL (ref 0–149)
VLDL Cholesterol Cal: 16 mg/dL (ref 5–40)

## 2024-05-11 LAB — HEMOGLOBIN A1C
Est. average glucose Bld gHb Est-mCnc: 137 mg/dL
Hgb A1c MFr Bld: 6.4 % — ABNORMAL HIGH (ref 4.8–5.6)

## 2024-05-13 ENCOUNTER — Ambulatory Visit
Admission: RE | Admit: 2024-05-13 | Discharge: 2024-05-13 | Disposition: A | Discharge status: Home / Self Care | Source: Ambulatory Visit | Attending: Student | Admitting: Student

## 2024-05-13 ENCOUNTER — Ambulatory Visit: Payer: Self-pay | Admitting: Internal Medicine

## 2024-05-13 DIAGNOSIS — R490 Dysphonia: Secondary | ICD-10-CM | POA: Diagnosis not present

## 2024-05-13 MED ORDER — IOPAMIDOL (ISOVUE-300) INJECTION 61%
75.0000 mL | Freq: Once | INTRAVENOUS | Status: AC | PRN
Start: 2024-05-13 — End: 2024-05-13
  Administered 2024-05-13: 75 mL via INTRAVENOUS

## 2024-05-13 NOTE — Telephone Encounter (Signed)
 Noted

## 2024-05-13 NOTE — Telephone Encounter (Signed)
 I received a message from Dr. Allena Hamilton today stating she is unable to take patient at this time.  I left voicemail for patient letting her know that we did receive her message.  I let her know that I did check with both Dr. Marylynn and Dr. Hamilton, and unfortunately, neither of them are able to take her as their patient at this time.  I did let patient know the names of the providers we have who are accepting new patients, and asked her to please call us  if she would like for us  to schedule an appointment for her with one of them.

## 2024-05-14 NOTE — Progress Notes (Signed)
 Patient notified

## 2024-05-15 ENCOUNTER — Ambulatory Visit (INDEPENDENT_AMBULATORY_CARE_PROVIDER_SITE_OTHER): Admitting: Podiatry

## 2024-05-15 DIAGNOSIS — M79674 Pain in right toe(s): Secondary | ICD-10-CM | POA: Diagnosis not present

## 2024-05-15 DIAGNOSIS — M79675 Pain in left toe(s): Secondary | ICD-10-CM

## 2024-05-15 DIAGNOSIS — D239 Other benign neoplasm of skin, unspecified: Secondary | ICD-10-CM | POA: Diagnosis not present

## 2024-05-15 DIAGNOSIS — B351 Tinea unguium: Secondary | ICD-10-CM | POA: Diagnosis not present

## 2024-05-15 NOTE — Progress Notes (Signed)
 She presents today chief complaint of painful elongated toenails as well as calluses plantar aspect of her foot bilaterally.  Objective: Vital signs are stable alert and oriented x 3.  Pulses are palpable.  Toenails are long thick yellow dystrophic onychomycotic benign neoplastic skin lesions plantar aspect forefoot right.  Assessment: Pain limb secondary to onychomycosis and benign skin lesions plantar foot.  Plan: Debridement of both benign skin lesions and nails 1 through 5 bilateral.

## 2024-05-20 ENCOUNTER — Other Ambulatory Visit: Payer: Self-pay | Admitting: Internal Medicine

## 2024-05-20 DIAGNOSIS — I10 Essential (primary) hypertension: Secondary | ICD-10-CM

## 2024-06-04 NOTE — Telephone Encounter (Signed)
 open in error

## 2024-06-16 ENCOUNTER — Other Ambulatory Visit: Payer: Self-pay | Admitting: Internal Medicine

## 2024-06-16 DIAGNOSIS — I10 Essential (primary) hypertension: Secondary | ICD-10-CM

## 2024-07-16 ENCOUNTER — Telehealth: Payer: Self-pay | Admitting: Gastroenterology

## 2024-07-16 NOTE — Telephone Encounter (Signed)
 Inbound call from patient stating she vomited Christmas day and again last night. States there was a red color but she did not eat or drink anything red. Patient is requesting a call to discuss further. Please advise, thank you.

## 2024-07-16 NOTE — Telephone Encounter (Signed)
 Pt states she had a couple more vomiting episodes with quite a bit of burning in the epigastric area. She states she did not remember eating or drinking anything red but it looked quite red. Discussed with pt if she vomits again and she thinks she sees blood then she needs to be seen at the er. She verbalized understanding. Pt scheduled to see Nestor Blower PA 07/24/24 at 2:10pm. Pt aware of appt.

## 2024-07-24 ENCOUNTER — Ambulatory Visit: Admitting: Gastroenterology

## 2024-07-25 NOTE — Telephone Encounter (Addendum)
 Patient called stating she is still having vomiting and heart burn severely. States now she is also having diarrhea pretty badly. Requesting a call back to discuss further. Please advise, thank you

## 2024-07-25 NOTE — Telephone Encounter (Addendum)
 Pt returned call and stated she is having trouble with her cell phone. Pt requested to call daughter Alvira [phone] before 5pm if unable to reach pt. Daughter made aware to expect a possible call. Please advise. Thank you

## 2024-07-25 NOTE — Telephone Encounter (Signed)
 Left message for pt to call back

## 2024-07-26 ENCOUNTER — Ambulatory Visit: Admitting: Gastroenterology

## 2024-07-26 ENCOUNTER — Encounter: Payer: Self-pay | Admitting: Gastroenterology

## 2024-07-26 DIAGNOSIS — K529 Noninfective gastroenteritis and colitis, unspecified: Secondary | ICD-10-CM

## 2024-07-26 DIAGNOSIS — K449 Diaphragmatic hernia without obstruction or gangrene: Secondary | ICD-10-CM

## 2024-07-26 DIAGNOSIS — K219 Gastro-esophageal reflux disease without esophagitis: Secondary | ICD-10-CM

## 2024-07-26 DIAGNOSIS — R112 Nausea with vomiting, unspecified: Secondary | ICD-10-CM | POA: Diagnosis not present

## 2024-07-26 DIAGNOSIS — K2100 Gastro-esophageal reflux disease with esophagitis, without bleeding: Secondary | ICD-10-CM

## 2024-07-26 DIAGNOSIS — A09 Infectious gastroenteritis and colitis, unspecified: Secondary | ICD-10-CM

## 2024-07-26 MED ORDER — VOQUEZNA 20 MG PO TABS: 20.0000 mg | ORAL_TABLET | Freq: Every day | ORAL | 3 refills | Status: DC

## 2024-07-26 MED ORDER — SUCRALFATE 1 G PO TABS: 1.0000 g | ORAL_TABLET | Freq: Four times a day (QID) | ORAL | 0 refills | Status: AC

## 2024-07-26 NOTE — Telephone Encounter (Signed)
 Patient reports she has been having nausea and diarrhea since Christmas day. Reports sometimes she projectile vomits. She had an appt but cancelled it because she thought she was better but she started haviing issues again. Pt scheduled to see Bayley McMichael PA today at 3pm. Pt aware of appt.

## 2024-07-26 NOTE — Progress Notes (Unsigned)
 "  Chief Complaint: nausea, vomiting, diarrhea Primary GI MD: Dr. Leigh  HPI: 84 year old female s/p cholecystectomy and others as listed below presents for follow-up.    Visit 04/26/2023: Chronic diarrhea, negative celiac testing, associated urinary incontinence/frequency but unable to go to pelvic floor physical therapy due to time constraints.  EGD with erosive esophagitis   Visit 06/22/2023: Continued diarrhea with stress fecal incontinence and urinary frequency.  Negative fecal elastase.  Recommended fiber.  Continue persistent GERD symptoms despite PPI twice daily and Carafate .  Recommended Voquenza   Visit 09/11/2023 with Dr. Leigh: Continued reflux symptoms in the setting of hiatal hernia with intermittent hematemesis and history of erosive esophagitis, gastritis, increased eosinophils.  Provided with free samples of Eliquis and at that time also recommended repeat EGD which showed LA grade B reflux esophagitis and 6 cm hiatal hernia  Visit 12/01/2023 with ME: drastic improvement in her GERD symptoms on voquezna  and it also improved her chronic diarrhea  --------------TODAY----------------------------   Discussed the use of AI scribe software for clinical note transcription with the patient, who gave verbal consent to proceed.  History of Present Illness  [name] is an 84 year old female with a large hiatal hernia and chronic gastroesophageal reflux who presents for evaluation of vomiting and worsening reflux symptoms.  Since December, she has experienced severe, nearly daily episodes of projectile vomiting, often accompanied by a sensation of blockage and pain or pressure when attempting to vomit. The most recent episode occurred early yesterday morning. Each vomiting episode was frequently followed by diarrhea, which resolved after two days of Pepto Bismol use. She has not had diarrhea since, but has not had a bowel movement yet today; her baseline is two to three  bowel movements daily. Persistent bloating and a sensation of fullness are present.  Gastrointestinal symptoms began after an illness in December initially thought to be influenza, for which she received doxycycline , Mucinex , and a strong cough medicine. She noted that many people were ill at Christmas and local hospitals were full at that time.  She has a history of hiatal hernia and ongoing reflux and heartburn despite daily Voquezna . She recalls better symptom control on a higher dose of Voquezna . Frequent, loud abdominal noises are present, which she finds embarrassing in public. She denies associated gas. She wears diapers due to incontinence.  Additional history includes severe nocturnal leg cramps managed with mustard and use of a computerized leg machine for two hours daily. Past medical history is notable for pancreatitis requiring hospitalization, cholecystectomy, exploratory surgery, and stage III melanoma with metastasis treated surgically and followed by oncology for five years.    PREVIOUS GI WORKUP   History of colonoscopy in 2014 by [facility] with having to be admitted to the hospital for pain after procedure and concern for perforation. She was told she should never have a colonoscopy again.    EGD 2018 for hematemesis - Esophagogastric landmarks identified.  - 3 cm hiatal hernia.  - Multiple plaques in the proximal esophagus and in the mid esophagus, suspicious for esophageal candidiasis. Brushings performed.  - No evidence of Barrett' s or esophagitis  - Normal stomach.  - Normal duodenal bulb and second portion of the duodenum.   CT scan 07/26/2017 IMPRESSION: 1. No acute process in the abdomen or pelvis. No evidence of metastatic disease in the abdomen or pelvis. 2.  Aortic Atherosclerosis (ICD10-I70.0). 3. Pelvic floor laxity. 4. Hiatal hernia.     Virtual colonoscopy 02/25/2020 - IMPRESSION: 1.  Suboptimal evaluation of the sigmoid, secondary to underdistention. No  circumferential mass within this region. More proximally, no evidence of clinically significant colonic polyp or mass. 2. Small hiatal hernia. 3. Aortic Atherosclerosis (ICD10-I70.0).     CT renal stone study 08/18/22: IMPRESSION: Extensive colonic diverticulosis without evidence of diverticulitis.  Moderate-sized hiatal hernia. Umbilical hernia containing fat. No acute intra-abdominal or intrapelvic abnormalities. Aortic Atherosclerosis (ICD10-I70.0).     CT abdomen / pelvis 11/27/22: IMPRESSION: 1. No acute localizing process in the abdomen or pelvis. 2. Colonic diverticulosis without evidence for diverticulitis. 3. Moderate-sized hiatal hernia.   EGD 05/16/2023 for hematemesis and GERD - LA Grade B reflux esophagitis with no bleeding. Biopsied.  - 4 cm hiatal hernia. 31- 35 cm  - Gastritis focal area posterior wall of antrum. Biopsied.  - Gastroesophageal flap valve classified as Hill Grade II ( fold present, opens with respiration) .  - The examination was otherwise normal. She did have transient O2 desaturation w/ o complications.   EGD 10/05/2023 - 6 cm hiatal hernia.  - LA Grade B reflux esophagitis.  - Normal stomach. Biopsied.  - Normal examined duodenum.  Past Medical History:  Diagnosis Date   Acid reflux    Allergy    Anemia    Anxiety    in past   Arthritis    all over   Asthma    Atypical moles    Bronchitis    Bronchospasm    reactive with perfume/cologne and smoke   CAD (coronary artery disease)    patient denies on preop of 10/24    Candida esophagitis (HCC)    Carotid artery calcification, bilateral 04/2016   Carpal tunnel syndrome of left wrist 12/03/2019   Cataract    Cervical stenosis of spine    Chronic insomnia    Complication of anesthesia    shoulder surgery- not all the way asleep   COVID-19 02/04/2021   also 10/3 or 04/20/21 sxs started 04/05/21   Depression    in past   Diverticulitis    Diverticulosis    Dizziness    Elevated  liver enzymes    Family history of adverse reaction to anesthesia    sister- has problems with nausea and vomiting    Fatigue    Fatty liver    Generalized headache    migraines   GERD (gastroesophageal reflux disease)    Gross hematuria 08/18/2022   Hereditary and idiopathic peripheral neuropathy 05/11/2015   History of chemotherapy    topical cream for h/o skin CA   History of kidney stones    History of neuropathy    FH Dr. Tobie neurology   History of vertigo    Hx of migraines    Hyperlipidemia    Hypothyroidism    Insomnia    Irritable bowel syndrome    LLQ abdominal pain    Melanoma (HCC) 2013   Melanoma (HCC)    x2 stg II melanoma   Neuropathy, peripheral    Peripheral neuropathy    Pneumonia    hx of x 2   PONV (postoperative nausea and vomiting)    Pre-diabetes    Retina disorder    left   SCC (squamous cell carcinoma)    skin    Skin cancer 07/17/2019   SCC left leg s/p mohs   SCC 05/01/18 left medial mid leg, right post mid leg 06/08/18, left mid well 04/10/19  Dr. Robinson derm, Dr. Bette Bon mohs  Stroke [facility])    ? TIA per Dr V per patient    Thyroid  disease    Urine incontinence    Viral URI with cough 06/09/2022   Vitamin D  deficiency    Weight gain     Past Surgical History:  Procedure Laterality Date   ABLATION Bilateral    Dr. Marea 12/2020 veins laser ablation of both great saphenous veins in a staged fashion.   BILATERAL SALPINGOOPHORECTOMY     ovarian cyst Dr. Lenon and Dr. Lindajean 2008   BREAST SURGERY     in 2014 bx breast calcifications   BREAST SURGERY     augmentation with saline Dr. Nelwyn Santa 1995    CHOLECYSTECTOMY N/A 01/28/2015   Procedure: LAPAROSCOPIC CHOLECYSTECTOMY WITH INTRAOPERATIVE CHOLANGIOGRAM;  Surgeon: Deward Null III, MD;  Location: [facility] OR;  Service: General;  Laterality: N/A;   CLOSED MANIPULATION SHOULDER     rt   COLONOSCOPY WITH PROPOFOL  N/A 11/20/2012   Procedure: COLONOSCOPY WITH PROPOFOL ;  Surgeon:  Gladis MARLA Louder, MD;  Location: [facility] ENDOSCOPY;  Service: Endoscopy;  Laterality: N/A;   colonscopy     May 2014   FOOT BONE EXCISION     JOINT REPLACEMENT     RTK   KNEE ARTHROSCOPY     left knee miniscus tear repair     MELANOMA EXCISION WITH SENTINEL LYMPH NODE BIOPSY  03/18/2011   Back, nodes neg   OOPHORECTOMY     ROTATOR CUFF REPAIR     left   SHOULDER SURGERY     right and left    SKIN CANCER EXCISION     multiple   TOTAL HIP ARTHROPLASTY     TOTAL KNEE ARTHROPLASTY     right 2010 and left in 2018    TOTAL KNEE ARTHROPLASTY Left 05/16/2017   Procedure: LEFT TOTAL KNEE ARTHROPLASTY;  Surgeon: Ernie Cough, MD;  Location: [facility] ORS;  Service: Orthopedics;  Laterality: Left;  70 mins   TUBAL LIGATION     TUMOR REMOVAL     ovary    Current Outpatient Medications  Medication Sig Dispense Refill   albuterol  (PROAIR  HFA) 108 (90 Base) MCG/ACT inhaler Inhale 1-2 puffs into the lungs every 6 (six) hours as needed for wheezing or shortness of breath. 18 g 11   aspirin  81 MG tablet TAKE 1 TABLET BY MOUTH EVERY DAY     atorvastatin  (LIPITOR) 20 MG tablet Take 1 tablet (20 mg total) by mouth daily. 90 tablet 3   Biotin 1000 MCG tablet Take 1,000 mcg by mouth daily.     BREO ELLIPTA  100-25 MCG/ACT AEPB Inhale 1 puff into the lungs daily. Inhale 1 puff by mouth once daily 60 each 3   Calcium  600-200 MG-UNIT tablet Take 1 tablet daily by mouth.     Cholecalciferol  (VITAMIN D3) 2000 UNITS capsule Take 2,000 Units by mouth daily.     cholestyramine  (QUESTRAN ) 4 g packet Take 4 g by mouth 2 (two) times daily as needed.     DULoxetine  (CYMBALTA ) 60 MG capsule Take 1 capsule (60 mg total) by mouth daily. 90 capsule 2   FOLIC ACID  PO Take by mouth daily.     levothyroxine  (SYNTHROID ) 75 MCG tablet Take 1 tablet (75 mcg total) by mouth daily before breakfast. 90 tablet 3   Magnesium  250 MG TABS Take by mouth.     olmesartan  (BENICAR ) 5 MG tablet Take 2 tablets by mouth twice daily 120 tablet 3    Omega-3 Fatty Acids (FISH  OIL) 1000 MG CAPS Take 1,000 mg by mouth daily.      psyllium (METAMUCIL) 58.6 % packet Take 1 packet by mouth daily.     sucralfate  (CARAFATE ) 1 g tablet Take 1 tablet (1 g total) by mouth 4 (four) times daily. 98 tablet 0   vitamin B-12 (CYANOCOBALAMIN ) 1000 MCG tablet Take 2,000 mcg by mouth daily. 2 tabs     Vonoprazan Fumarate  (VOQUEZNA ) 20 MG TABS Take 20 mg by mouth daily. Take 20mg  daily for 8 weeks, then reduce to 10mg  daily 60 tablet 3   No current facility-administered medications for this visit.    Allergies as of 07/26/2024 - Review Complete 07/26/2024  Allergen Reaction Noted   Epinephrine  Other (See Comments) and Anaphylaxis 02/28/2011   Other Anaphylaxis 05/10/2017   Penicillins Swelling and Other (See Comments) 02/28/2011   Valium [diazepam] Other (See Comments) 07/01/2014    Family History  Problem Relation Age of Onset   Hypertension Mother    Stroke Mother    Hyperlipidemia Mother    Heart disease Father    Diabetes Father    Hypertension Father    Hyperlipidemia Father    Early death Father    Diabetes Sister    Heart disease Sister        heart murmur   Arthritis Sister    COPD Sister    Hyperlipidemia Sister    Hypertension Sister    Colon polyps Sister        adenomous   Arthritis Sister    Heart disease Sister    Hyperlipidemia Sister    Hypertension Sister    Cancer Sister    Dementia Sister    Tremor Sister    Stroke Maternal Grandmother    Colon cancer Maternal Grandfather    Cancer Maternal Grandfather    Early death Maternal Grandfather    Early death Paternal Grandmother    Stroke Paternal Grandmother    Heart disease Daughter    Hyperlipidemia Daughter    Hypertension Daughter    Heart disease Son    Hyperlipidemia Son    Alcohol abuse Son    Depression Son    Diabetes Son    Pancreatic cancer Maternal Uncle    Alzheimer's disease Paternal Aunt    Alzheimer's disease Paternal Aunt    Alzheimer's  disease Paternal Aunt    Alzheimer's disease Paternal Aunt    Cancer Other        2 paternal uncles and 1 maternal uncle colon cancer   Esophageal cancer Neg Hx    Stomach cancer Neg Hx     Social History   Socioeconomic History   Marital status: Divorced    Spouse name: Not on file   Number of children: 3   Years of education: College   Highest education level: Some college, no degree  Occupational History   Occupation: Retired    Associate Professor: RETIRED  Tobacco Use   Smoking status: Never   Smokeless tobacco: Never  Vaping Use   Vaping status: Never Used  Substance and Sexual Activity   Alcohol use: Yes    Alcohol/week: 1.0 standard drink of alcohol    Types: 1 Cans of beer per week    Comment: mixed drink about two times a year   Drug use: No   Sexual activity: Not Currently  Other Topics Concern   Not on file  Social History Narrative   Patient lives at home alone in a one story home.   Has  3 children (2 daughters and 1 son)    Retired from [employer].   Divorced since 1992    Caffeine Use: 1 cup daily and a soft drink occasionally    No guns    Wears seat belt     Lives with cat    Left handed   Social Drivers of Health   Tobacco Use: Low Risk (07/26/2024)   Patient History    Smoking Tobacco Use: Never    Smokeless Tobacco Use: Never    Passive Exposure: Not on file  Financial Resource Strain: Low Risk (04/18/2023)   Overall Financial Resource Strain (CARDIA)    Difficulty of Paying Living Expenses: Not hard at all  Food Insecurity: No Food Insecurity (12/18/2023)   Hunger Vital Sign    Worried About Running Out of Food in the Last Year: Never true    Ran Out of Food in the Last Year: Never true  Transportation Needs: No Transportation Needs (12/18/2023)   PRAPARE - Administrator, Civil Service (Medical): No    Lack of Transportation (Non-Medical): No  Physical Activity: Inactive (04/18/2023)   Exercise Vital Sign    Days of Exercise  per Week: 0 days    Minutes of Exercise per Session: 0 min  Stress: Stress Concern Present (04/18/2023)   [employer] of Occupational Health - Occupational Stress Questionnaire    Feeling of Stress : To some extent  Social Connections: Moderately Integrated (12/18/2023)   Social Connection and Isolation Panel    Frequency of Communication with Friends and Family: More than three times a week    Frequency of Social Gatherings with Friends and Family: Three times a week    Attends Religious Services: More than 4 times per year    Active Member of Clubs or Organizations: Yes    Attends Banker Meetings: More than 4 times per year    Marital Status: Divorced  Intimate Partner Violence: Not At Risk (12/18/2023)   Humiliation, Afraid, Rape, and Kick questionnaire    Fear of Current or Ex-Partner: No    Emotionally Abused: No    Physically Abused: No    Sexually Abused: No  Depression (PHQ2-9): Medium Risk (05/10/2024)   Depression (PHQ2-9)    PHQ-2 Score: 8  Alcohol Screen: Low Risk (04/18/2023)   Alcohol Screen    Last Alcohol Screening Score (AUDIT): 1  Housing: Low Risk (12/18/2023)   Housing Stability Vital Sign    Unable to Pay for Housing in the Last Year: No    Number of Times Moved in the Last Year: 0    Homeless in the Last Year: No  Utilities: Not At Risk (12/18/2023)   AHC Utilities    Threatened with loss of utilities: No  Health Literacy: Inadequate Health Literacy (04/18/2023)   B1300 Health Literacy    Frequency of need for help with medical instructions: Sometimes    Review of Systems:    Constitutional: No weight loss, fever, chills, weakness or fatigue HEENT: Eyes: No change in vision               Ears, Nose, Throat:  No change in hearing or congestion Skin: No rash or itching Cardiovascular: No chest pain, chest pressure or palpitations   Respiratory: No SOB or cough Gastrointestinal: See HPI and otherwise negative Genitourinary: No dysuria or  change in urinary frequency Neurological: No headache, dizziness or syncope Musculoskeletal: No new muscle or joint pain Hematologic: No bleeding or bruising  Psychiatric: No history of depression or anxiety    Physical Exam:  Vital signs: BP 110/68   Pulse 76   Ht 5' (1.524 m)   Wt 186 lb 6 oz (84.5 kg)   BMI 36.40 kg/m   Constitutional: NAD, alert and cooperative Head:  Normocephalic and atraumatic. Eyes:   PEERL, EOMI. No icterus. Conjunctiva pink. Respiratory: Respirations even and unlabored. Lungs clear to auscultation bilaterally.   No wheezes, crackles, or rhonchi.  Cardiovascular:  Regular rate and rhythm. No peripheral edema, cyanosis or pallor.  Gastrointestinal:  Soft, nondistended, nontender. No rebound or guarding. Normal bowel sounds. No appreciable masses or hepatomegaly. Rectal: Normal sphincter tone, small external hemorrhoid nonthrombosed.  No stool in vault.  Diatherix unsuccessful Msk:  Symmetrical without gross deformities. Without edema, no deformity or joint abnormality.  Neurologic:  Alert and  oriented x4;  grossly normal neurologically.  Skin:   Dry and intact without significant lesions or rashes. Psychiatric: Oriented to person, place and time. Demonstrates good judgement and reason without abnormal affect or behaviors.  Physical Exam    RELEVANT LABS AND IMAGING: CBC    Component Value Date/Time   WBC 5.3 05/10/2024 1033   WBC 5.7 12/18/2023 0937   RBC 4.16 05/10/2024 1033   RBC 4.58 12/18/2023 0937   HGB 12.2 05/10/2024 1033   HGB 14.9 04/07/2016 1157   HCT 36.8 05/10/2024 1033   HCT 45.7 04/07/2016 1157   PLT 231 05/10/2024 1033   MCV 89 05/10/2024 1033   MCV 91.1 04/07/2016 1157   MCH 29.3 05/10/2024 1033   MCH 29.0 12/18/2023 0937   MCHC 33.2 05/10/2024 1033   MCHC 33.0 12/18/2023 0937   RDW 12.5 05/10/2024 1033   RDW 13.9 04/07/2016 1157   LYMPHSABS 1.4 05/10/2024 1033   LYMPHSABS 1.8 04/07/2016 1157   MONOABS 0.6 08/22/2022 1442    MONOABS 0.6 04/07/2016 1157   EOSABS 0.2 05/10/2024 1033   BASOSABS 0.0 05/10/2024 1033   BASOSABS 0.0 04/07/2016 1157    CMP     Component Value Date/Time   NA 142 05/10/2024 1033   NA 143 04/07/2016 1158   K 4.2 05/10/2024 1033   K 4.0 04/07/2016 1158   CL 104 05/10/2024 1033   CL 113 (H) 05/22/2012 1004   CO2 23 05/10/2024 1033   CO2 21 (L) 04/07/2016 1158   GLUCOSE 111 (H) 05/10/2024 1033   GLUCOSE 111 (H) 12/19/2023 0307   GLUCOSE 103 04/07/2016 1158   GLUCOSE 111 (H) 05/22/2012 1004   BUN 17 05/10/2024 1033   BUN 23.0 04/07/2016 1158   CREATININE 1.01 (H) 05/10/2024 1033   CREATININE 1.10 (H) 04/14/2020 0826   CREATININE 0.9 04/07/2016 1158   CALCIUM  9.0 05/10/2024 1033   CALCIUM  9.3 04/07/2016 1158   PROT 6.6 05/10/2024 1033   PROT 7.1 04/07/2016 1158   ALBUMIN 4.5 05/10/2024 1033   ALBUMIN 3.9 04/07/2016 1158   AST 17 05/10/2024 1033   AST 12 04/07/2016 1158   ALT 16 05/10/2024 1033   ALT 16 04/07/2016 1158   ALKPHOS 92 05/10/2024 1033   ALKPHOS 79 04/07/2016 1158   BILITOT 1.1 05/10/2024 1033   BILITOT 1.40 (H) 04/07/2016 1158   GFRNONAA 45 (L) 12/19/2023 0307   GFRAA >60 05/31/2019 1251     Assessment/Plan:   Acute on chronic diarrhea Nausea and vomiting Previous controlled on fiber and voquezna  with recurrence after taking doxycycline  for illness early December. Associated nausea and vomiting which is worsening her reflux.  Concern  for C. difficile with doxycycline  and watery diarrhea up to 4 times a day -- attempted diatherix in office, unable to obtain stool -- GI pathogen and C. Difficile  GERD with esophagitis Hiatal hernia EGD 09/2023 with LA grade B reflux esophagitis and 6 cm hiatal hernia. Previous failed BID PPI and carafate .  Improved on voquezna  10mg  at last visit currently having flareup secondary to recurrent vomiting - voquezna  20 mg for 8 weeks and then can go back down to maintenance 10 mg - Add on Carafate  up to 4 times a day -  Refer to Dr. Curvin [facility] to discuss hiatal hernia as patient continues to have refractory symptoms and she is interested in possibility of hiatal hernia repair  Nestor Mollie RIGGERS  Gastroenterology 07/26/2024, 3:58 PM  Cc: Fernand Fredy RAMAN, MD "

## 2024-07-26 NOTE — Progress Notes (Signed)
 Agree with assessment and plan as outlined.

## 2024-07-26 NOTE — Patient Instructions (Addendum)
 "  Voquezna  20mg  for 8 weeks and then continue 10mg  dose Follow up with Dr. Curvin regarding hiatal hernia and persistent GERD Carafate  up to 4 times daily (1 hour away from food) Complete your stool study   __________________________________________________  If your blood pressure at your visit was 140/90 or greater, please contact your primary care physician to follow up on this.  _______________________________________________________  If you are age 84 or older, your body mass index should be between 23-30. Your Body mass index is 36.4 kg/m. If this is out of the aforementioned range listed, please consider follow up with your Primary Care Provider.  If you are age 38 or younger, your body mass index should be between 19-25. Your Body mass index is 36.4 kg/m. If this is out of the aformentioned range listed, please consider follow up with your Primary Care Provider.   ________________________________________________________  The [facility] providers would like to encourage you to use MYCHART to communicate with providers for non-urgent requests or questions.  Due to long hold times on the telephone, sending your provider a message by [facility] may be a faster and more efficient way to get a response.  Please allow 48 business hours for a response.  Please remember that this is for non-urgent requests.  _______________________________________________________  [facility] is using a team-based approach to care.  Your team is made up of your doctor and two to three APPS. Our APPS (Nurse Practitioners and Physician Assistants) work with your physician to ensure care continuity for you. They are fully qualified to address your health concerns and develop a treatment plan. They communicate directly with your gastroenterologist to care for you. Seeing the Advanced Practice Practitioners on your physician's team can help you by facilitating care more promptly, often allowing for earlier  appointments, access to diagnostic testing, procedures, and other specialty referrals.   Your provider has ordered Diatherix stool testing for you. You have received a kit from our office today containing all necessary supplies to complete this test. Please carefully read the stool collection instructions provided in the kit before opening the accompanying materials. In addition, be sure there is a label providing your full name and date of birth on the puritan opti-swab tube that is supplied in the kit (if you do not see a label with this information on your test tube, please make us  aware before test collection!). After completing the test, you should secure the purtian tube into the specimen biohazard bag. The [facility] E-Req sheet (including date and time of specimen collection) should be placed into the outside pocket of the specimen biohazard bag and returned to the [place] lab (basement floor of [facility]) within 3 days of collection. Please make sure to give the specimen to a staff member at the lab. DO NOT leave the specimen on the counter.   If the specimen date and time (can be found in the upper right boxed portion of the sheet) are not filled out on the E-Req sheet, the test will NOT be performed.   Due to recent changes in healthcare laws, you may see the results of your imaging and laboratory studies on MyChart before your provider has had a chance to review them.  We understand that in some cases there may be results that are confusing or concerning to you. Not all laboratory results come back in the same time frame and the provider may be waiting for multiple results in order to interpret others.  Please give us  48 hours in order for your provider to thoroughly review all the results before contacting the office for clarification of your results.   "

## 2024-07-26 NOTE — Telephone Encounter (Signed)
 Left message for pt to call back

## 2024-07-30 ENCOUNTER — Telehealth: Payer: Self-pay

## 2024-07-30 NOTE — Telephone Encounter (Addendum)
 Faxed 17 pages of records to [facility] p[er [employer].  Fax confirmation received.

## 2024-08-01 ENCOUNTER — Telehealth: Payer: Self-pay | Admitting: Gastroenterology

## 2024-08-01 NOTE — Telephone Encounter (Signed)
 Incoming call from pt regarding rx sucralfate  (CARAFATE ). Pt stated she is sleeping all day on this medication. Pt requesting medical advice. Please advise. Thank you.

## 2024-08-01 NOTE — Telephone Encounter (Signed)
 Left message for patient to return call to our office.  Will continue efforts.

## 2024-08-01 NOTE — Telephone Encounter (Signed)
 Patient returned call to our office and was advised that sucralfate  should not be making her sleepy.  She does report a recent death and a family member was recently diagnosed with cancer, so there has been some added stress lately.  Patient instructed to contact her PCP for further recommendations of anxiety/stress medications.  Patient agreed to plan and verbalized understanding.  No further questions or concerns.

## 2024-08-02 ENCOUNTER — Telehealth: Payer: Self-pay | Admitting: Gastroenterology

## 2024-08-02 NOTE — Telephone Encounter (Signed)
 Negative GI pathogen panel [facility]

## 2024-08-08 ENCOUNTER — Ambulatory Visit: Admitting: Pulmonary Disease

## 2024-08-08 ENCOUNTER — Ambulatory Visit: Payer: Self-pay | Admitting: Pulmonary Disease

## 2024-08-08 NOTE — Telephone Encounter (Signed)
 Noted. Nothing further needed.

## 2024-08-08 NOTE — Telephone Encounter (Signed)
 FYI Only or Action Required?: FYI only for provider: appointment scheduled on 1/22.  Patient is followed in Pulmonology for pulm fibrosis, last seen on 06/23/2023 by Malachy Comer GAILS, NP.  Called Nurse Triage reporting Cough.  Symptoms began several days ago.  Interventions attempted: Prescription medications: doxy, cough syrup  and Maintenance inhaler.  Symptoms are: gradually worsening.  Triage Disposition: See Physician Within 24 Hours  Patient/caregiver understands and will follow disposition?: Yes  E2C2 Pulmonary Triage - Initial Assessment Questions Chief Complaint (e.g., cough, sob, wheezing, fever, chills, sweat or additional symptoms) *Go to specific symptom protocol after initial questions. Cough   How long have symptoms been present? Days   Have you tested for COVID or Flu? Note: If not, ask patient if a home test can be taken. If so, instruct patient to call back for positive results. No  MEDICINES:   Have you used any OTC meds to help with symptoms? Yes If yes, ask What medications? Mucinex   Have you used your inhalers/maintenance medication? Yes If yes, What medications? Breo- daily   If inhaler, ask How many puffs and how often? Note: Review instructions on medication in the chart. Breo-- Daily maintenance  OXYGEN: Do you wear supplemental oxygen? No If yes, How many liters are you supposed to use? na  Do you monitor your oxygen levels? No If yes, What is your reading (oxygen level) today? na  What is your usual oxygen saturation reading?  (Note: Pulmonary O2 sats should be 90% or greater) na  Message from [place] sent at 08/08/2024 10:41 AM EST  Reason for Triage: Worsening cough and chest congestion   Reason for Disposition  [1] Continuous (nonstop) coughing interferes with work or school AND [2] no improvement using cough treatment per Care Advice  Answer Assessment - Initial Assessment Questions Patient has had a  cough congestion for weeks. Fluctuates, starts to get better than thinks she catches something else.  Hardly any coughing yesterday- today she is constantly coughing fits and constant clearing of her throat.  Seen in [facility] and given Doxy a while back- completed- had left over cough syrup that she finished yesterday/last night. mucinex  Feels like cough settled into her chest Denies CP, Dizziness. Mild SOB at baseline and feels like she had a low grade fever this weekend. Sunday was the first time she went to church in a month due to illness and maybe picked up something else Dealing with Gastro thing- talking about removing hiatal hernia that is worsening   Patient rescheduled 1 year FU wth Dr Tamea- after further discussion- acute visit made for this afternoon to assess. ED/UC precautions understood  1. ONSET: When did the cough begin?      Ongoing  2. SEVERITY: How bad is the cough today?      Constant clearing cough  3. SPUTUM: Describe the color of your sputum (e.g., none, dry cough; clear, white, yellow, green)     Clear/white 4. HEMOPTYSIS: Are you coughing up any blood? If Yes, ask: How much? (e.g., flecks, streaks, tablespoons, etc.)     denies 5. DIFFICULTY BREATHING: Are you having difficulty breathing? If Yes, ask: How bad is it? (e.g., mild, moderate, severe)      Mild SOB at baseline- constant coughing 6. FEVER: Do you have a fever? If Yes, ask: What is your temperature, how was it measured, and when did it start?     Low grade fever- Sat 7. CARDIAC HISTORY: Do you have any history of heart  disease? (e.g., heart attack, congestive heart failure)      Htn, CVA 8. LUNG HISTORY: Do you have any history of lung disease?  (e.g., pulmonary embolus, asthma, emphysema)     Pulm fibrosis 9. PE RISK FACTORS: Do you have a history of blood clots? (or: recent major surgery, recent prolonged travel, bedridden)     denies 10. OTHER SYMPTOMS: Do you  have any other symptoms? (e.g., runny nose, wheezing, chest pain)       Chest congestion  Protocols used: Cough - Acute Productive-A-AH

## 2024-08-12 ENCOUNTER — Ambulatory Visit: Admitting: Internal Medicine

## 2024-08-12 ENCOUNTER — Other Ambulatory Visit (HOSPITAL_COMMUNITY): Payer: Self-pay

## 2024-08-14 ENCOUNTER — Encounter: Payer: Self-pay | Admitting: Gastroenterology

## 2024-08-14 NOTE — Telephone Encounter (Signed)
 Kurt Enid Benjamine Nat VEAR, [place] 08-14-24 We have left messages for her to call us  back for an appt to be seen for her Hernia but no calls back from her.  Left message on voicemail that [facility] has been trying to reach her for an appointment.  Advised on voicemail that patient should reach out to our office or contact [facility], both numbers provided on voicemail.

## 2024-08-21 ENCOUNTER — Telehealth: Payer: Self-pay | Admitting: Gastroenterology

## 2024-08-21 MED ORDER — VOQUEZNA 20 MG PO TABS: 20.0000 mg | ORAL_TABLET | Freq: Every day | ORAL | 0 refills | Status: AC

## 2024-08-21 NOTE — Telephone Encounter (Signed)
 Patient was instructed to take 20 mg daily for 8 weeks and then reduce to 10 mg thereafter.  Script was sent to [facility] last month.  Sent #30 of the 20 mg tablets to [facility] as requested, for patient to complete 8 weeks at 20 mg

## 2024-08-21 NOTE — Telephone Encounter (Signed)
 PT is calling to have a refill for voquezna  sent to [name] on [address]. She stated she only has two pills left and really needs them

## 2024-08-22 ENCOUNTER — Telehealth: Payer: Self-pay | Admitting: Gastroenterology

## 2024-08-22 NOTE — Telephone Encounter (Signed)
 MyChart message sent to patient to call [facility] for a refill since the original prescription was sent to them.  We can provide samples if needed to tide her over.

## 2024-08-22 NOTE — Telephone Encounter (Signed)
Referral has been sent to [facility] 

## 2024-08-22 NOTE — Telephone Encounter (Signed)
 Inbound call from patient stating that she is needing us  to send in a referral about her getting a hernia removed or fixed over to [facility]. Patient stated that she was told to just reach out to [facility] and schedule an appointment. Patient is now needing a referral. Please advise.

## 2024-08-22 NOTE — Telephone Encounter (Signed)
 Inbound call from patient stating that she spoke to [facility] pharmacy in regards to her Voquezna  and was advised to reach out to us  and inform us  that we need provider authorization. Please advise.

## 2024-09-05 ENCOUNTER — Ambulatory Visit: Admitting: Internal Medicine

## 2024-09-19 ENCOUNTER — Ambulatory Visit: Admitting: Pulmonary Disease
